# Patient Record
Sex: Female | Born: 1945
Health system: Southern US, Community
[De-identification: ages and names within clinical notes are randomized; demographics above are authoritative.]

## PROBLEM LIST (undated history)

## (undated) DIAGNOSIS — I209 Angina pectoris, unspecified: Secondary | ICD-10-CM

## (undated) DIAGNOSIS — R0602 Shortness of breath: Secondary | ICD-10-CM

## (undated) DIAGNOSIS — F419 Anxiety disorder, unspecified: Secondary | ICD-10-CM

## (undated) DIAGNOSIS — R12 Heartburn: Secondary | ICD-10-CM

## (undated) DIAGNOSIS — R51 Headache: Secondary | ICD-10-CM

## (undated) DIAGNOSIS — I499 Cardiac arrhythmia, unspecified: Secondary | ICD-10-CM

## (undated) DIAGNOSIS — R519 Headache, unspecified: Secondary | ICD-10-CM

## (undated) DIAGNOSIS — C801 Malignant (primary) neoplasm, unspecified: Secondary | ICD-10-CM

## (undated) DIAGNOSIS — R112 Nausea with vomiting, unspecified: Secondary | ICD-10-CM

## (undated) DIAGNOSIS — C50919 Malignant neoplasm of unspecified site of unspecified female breast: Secondary | ICD-10-CM

## (undated) DIAGNOSIS — D649 Anemia, unspecified: Secondary | ICD-10-CM

## (undated) DIAGNOSIS — K317 Polyp of stomach and duodenum: Secondary | ICD-10-CM

## (undated) DIAGNOSIS — T4145XA Adverse effect of unspecified anesthetic, initial encounter: Secondary | ICD-10-CM

## (undated) DIAGNOSIS — Z9889 Other specified postprocedural states: Secondary | ICD-10-CM

## (undated) DIAGNOSIS — I10 Essential (primary) hypertension: Secondary | ICD-10-CM

## (undated) DIAGNOSIS — R251 Tremor, unspecified: Secondary | ICD-10-CM

## (undated) DIAGNOSIS — I2510 Atherosclerotic heart disease of native coronary artery without angina pectoris: Secondary | ICD-10-CM

## (undated) DIAGNOSIS — K449 Diaphragmatic hernia without obstruction or gangrene: Secondary | ICD-10-CM

## (undated) DIAGNOSIS — Z923 Personal history of irradiation: Secondary | ICD-10-CM

## (undated) DIAGNOSIS — G4730 Sleep apnea, unspecified: Secondary | ICD-10-CM

## (undated) DIAGNOSIS — Z1379 Encounter for other screening for genetic and chromosomal anomalies: Secondary | ICD-10-CM

## (undated) DIAGNOSIS — E119 Type 2 diabetes mellitus without complications: Secondary | ICD-10-CM

## (undated) DIAGNOSIS — T8859XA Other complications of anesthesia, initial encounter: Secondary | ICD-10-CM

## (undated) DIAGNOSIS — E039 Hypothyroidism, unspecified: Secondary | ICD-10-CM

## (undated) DIAGNOSIS — M8580 Other specified disorders of bone density and structure, unspecified site: Secondary | ICD-10-CM

## (undated) DIAGNOSIS — K219 Gastro-esophageal reflux disease without esophagitis: Secondary | ICD-10-CM

## (undated) DIAGNOSIS — J189 Pneumonia, unspecified organism: Secondary | ICD-10-CM

## (undated) DIAGNOSIS — M1990 Unspecified osteoarthritis, unspecified site: Secondary | ICD-10-CM

## (undated) HISTORY — DX: Gastro-esophageal reflux disease without esophagitis: K21.9

## (undated) HISTORY — PX: ELBOW FRACTURE SURGERY: SHX616

## (undated) HISTORY — DX: Atherosclerotic heart disease of native coronary artery without angina pectoris: I25.10

## (undated) HISTORY — PX: INCONTINENCE SURGERY: SHX676

## (undated) HISTORY — PX: BREAST LUMPECTOMY: SHX2

## (undated) HISTORY — PX: DILATION AND CURETTAGE OF UTERUS: SHX78

## (undated) HISTORY — PX: OTHER SURGICAL HISTORY: SHX169

## (undated) HISTORY — DX: Heartburn: R12

## (undated) HISTORY — DX: Hypothyroidism, unspecified: E03.9

## (undated) HISTORY — DX: Polyp of stomach and duodenum: K31.7

## (undated) HISTORY — DX: Malignant (primary) neoplasm, unspecified: C80.1

## (undated) HISTORY — DX: Diaphragmatic hernia without obstruction or gangrene: K44.9

## (undated) HISTORY — DX: Encounter for other screening for genetic and chromosomal anomalies: Z13.79

## (undated) HISTORY — DX: Type 2 diabetes mellitus without complications: E11.9

## (undated) HISTORY — DX: Essential (primary) hypertension: I10

## (undated) HISTORY — DX: Other specified disorders of bone density and structure, unspecified site: M85.80

---
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
## 1985-08-13 HISTORY — PX: BREAST BIOPSY: SHX20

## 1999-01-04 ENCOUNTER — Other Ambulatory Visit: Admission: RE | Admit: 1999-01-04 | Discharge: 1999-01-04 | Payer: Self-pay | Admitting: Emergency Medicine

## 1999-02-02 ENCOUNTER — Ambulatory Visit (HOSPITAL_COMMUNITY): Admission: RE | Admit: 1999-02-02 | Discharge: 1999-02-02 | Payer: Self-pay | Admitting: Emergency Medicine

## 1999-06-15 ENCOUNTER — Encounter: Admission: RE | Admit: 1999-06-15 | Discharge: 1999-06-15 | Payer: Self-pay | Admitting: Family Medicine

## 1999-06-15 ENCOUNTER — Encounter: Payer: Self-pay | Admitting: Family Medicine

## 2000-02-05 ENCOUNTER — Encounter: Admission: RE | Admit: 2000-02-05 | Discharge: 2000-05-05 | Payer: Self-pay | Admitting: Emergency Medicine

## 2000-03-08 ENCOUNTER — Other Ambulatory Visit: Admission: RE | Admit: 2000-03-08 | Discharge: 2000-03-08 | Payer: Self-pay | Admitting: Emergency Medicine

## 2000-04-08 ENCOUNTER — Encounter: Payer: Self-pay | Admitting: Emergency Medicine

## 2000-04-08 ENCOUNTER — Ambulatory Visit (HOSPITAL_COMMUNITY): Admission: RE | Admit: 2000-04-08 | Discharge: 2000-04-08 | Payer: Self-pay | Admitting: Emergency Medicine

## 2001-03-18 ENCOUNTER — Encounter: Payer: Self-pay | Admitting: Emergency Medicine

## 2001-03-18 ENCOUNTER — Inpatient Hospital Stay (HOSPITAL_COMMUNITY): Admission: EM | Admit: 2001-03-18 | Discharge: 2001-03-20 | Payer: Self-pay | Admitting: Emergency Medicine

## 2001-05-08 ENCOUNTER — Other Ambulatory Visit: Admission: RE | Admit: 2001-05-08 | Discharge: 2001-05-08 | Payer: Self-pay | Admitting: Obstetrics and Gynecology

## 2001-09-05 ENCOUNTER — Encounter: Payer: Self-pay | Admitting: *Deleted

## 2001-09-05 ENCOUNTER — Encounter: Admission: RE | Admit: 2001-09-05 | Discharge: 2001-09-05 | Payer: Self-pay | Admitting: *Deleted

## 2001-09-16 ENCOUNTER — Ambulatory Visit (HOSPITAL_COMMUNITY): Admission: RE | Admit: 2001-09-16 | Discharge: 2001-09-17 | Payer: Self-pay | Admitting: Interventional Cardiology

## 2001-11-21 ENCOUNTER — Encounter: Payer: Self-pay | Admitting: Obstetrics and Gynecology

## 2001-11-21 ENCOUNTER — Ambulatory Visit (HOSPITAL_COMMUNITY): Admission: RE | Admit: 2001-11-21 | Discharge: 2001-11-21 | Payer: Self-pay | Admitting: Obstetrics and Gynecology

## 2002-09-01 ENCOUNTER — Other Ambulatory Visit: Admission: RE | Admit: 2002-09-01 | Discharge: 2002-09-01 | Payer: Self-pay | Admitting: Emergency Medicine

## 2002-09-21 ENCOUNTER — Ambulatory Visit (HOSPITAL_COMMUNITY): Admission: RE | Admit: 2002-09-21 | Discharge: 2002-09-21 | Payer: Self-pay | Admitting: Emergency Medicine

## 2002-09-21 ENCOUNTER — Encounter: Payer: Self-pay | Admitting: Emergency Medicine

## 2003-10-20 ENCOUNTER — Other Ambulatory Visit: Admission: RE | Admit: 2003-10-20 | Discharge: 2003-10-20 | Payer: Self-pay | Admitting: Emergency Medicine

## 2004-01-12 ENCOUNTER — Ambulatory Visit (HOSPITAL_COMMUNITY): Admission: RE | Admit: 2004-01-12 | Discharge: 2004-01-13 | Payer: Self-pay | Admitting: *Deleted

## 2004-03-23 ENCOUNTER — Other Ambulatory Visit: Admission: RE | Admit: 2004-03-23 | Discharge: 2004-03-23 | Payer: Self-pay | Admitting: Obstetrics and Gynecology

## 2004-06-26 ENCOUNTER — Ambulatory Visit (HOSPITAL_COMMUNITY): Admission: RE | Admit: 2004-06-26 | Discharge: 2004-06-26 | Payer: Self-pay | Admitting: Emergency Medicine

## 2004-12-05 ENCOUNTER — Ambulatory Visit (HOSPITAL_COMMUNITY): Admission: RE | Admit: 2004-12-05 | Discharge: 2004-12-06 | Payer: Self-pay | Admitting: *Deleted

## 2005-01-01 ENCOUNTER — Encounter (HOSPITAL_COMMUNITY): Admission: RE | Admit: 2005-01-01 | Discharge: 2005-01-14 | Payer: Self-pay | Admitting: *Deleted

## 2005-02-15 ENCOUNTER — Encounter (HOSPITAL_COMMUNITY): Admission: RE | Admit: 2005-02-15 | Discharge: 2005-05-16 | Payer: Self-pay | Admitting: *Deleted

## 2005-02-26 ENCOUNTER — Encounter (HOSPITAL_COMMUNITY): Admission: RE | Admit: 2005-02-26 | Discharge: 2005-02-27 | Payer: Self-pay | Admitting: *Deleted

## 2005-05-17 ENCOUNTER — Encounter (HOSPITAL_COMMUNITY): Admission: RE | Admit: 2005-05-17 | Discharge: 2005-08-12 | Payer: Self-pay | Admitting: *Deleted

## 2005-08-14 ENCOUNTER — Encounter (HOSPITAL_COMMUNITY): Admission: RE | Admit: 2005-08-14 | Discharge: 2005-11-12 | Payer: Self-pay | Admitting: *Deleted

## 2005-11-13 ENCOUNTER — Encounter (HOSPITAL_COMMUNITY): Admission: RE | Admit: 2005-11-13 | Discharge: 2006-02-11 | Payer: Self-pay | Admitting: *Deleted

## 2006-02-08 ENCOUNTER — Ambulatory Visit (HOSPITAL_COMMUNITY): Admission: RE | Admit: 2006-02-08 | Discharge: 2006-02-08 | Payer: Self-pay | Admitting: Emergency Medicine

## 2006-02-12 ENCOUNTER — Encounter (HOSPITAL_COMMUNITY): Admission: RE | Admit: 2006-02-12 | Discharge: 2006-05-13 | Payer: Self-pay | Admitting: *Deleted

## 2006-05-14 ENCOUNTER — Encounter (HOSPITAL_COMMUNITY): Admission: RE | Admit: 2006-05-14 | Discharge: 2006-08-12 | Payer: Self-pay | Admitting: *Deleted

## 2006-06-21 ENCOUNTER — Other Ambulatory Visit: Admission: RE | Admit: 2006-06-21 | Discharge: 2006-06-21 | Payer: Self-pay | Admitting: Emergency Medicine

## 2006-08-13 ENCOUNTER — Encounter (HOSPITAL_COMMUNITY): Admission: RE | Admit: 2006-08-13 | Discharge: 2006-11-11 | Payer: Self-pay | Admitting: *Deleted

## 2006-11-12 ENCOUNTER — Encounter (HOSPITAL_COMMUNITY): Admission: RE | Admit: 2006-11-12 | Discharge: 2007-02-10 | Payer: Self-pay | Admitting: *Deleted

## 2007-02-11 ENCOUNTER — Encounter (HOSPITAL_COMMUNITY): Admission: RE | Admit: 2007-02-11 | Discharge: 2007-05-12 | Payer: Self-pay | Admitting: *Deleted

## 2007-04-01 ENCOUNTER — Ambulatory Visit: Payer: Self-pay | Admitting: Internal Medicine

## 2007-05-14 ENCOUNTER — Ambulatory Visit: Payer: Self-pay | Admitting: Internal Medicine

## 2007-05-14 ENCOUNTER — Encounter: Payer: Self-pay | Admitting: Internal Medicine

## 2007-05-14 ENCOUNTER — Encounter (HOSPITAL_COMMUNITY): Admission: RE | Admit: 2007-05-14 | Discharge: 2007-08-12 | Payer: Self-pay | Admitting: *Deleted

## 2007-08-12 ENCOUNTER — Observation Stay (HOSPITAL_COMMUNITY): Admission: AD | Admit: 2007-08-12 | Discharge: 2007-08-13 | Payer: Self-pay | Admitting: Interventional Cardiology

## 2007-09-18 ENCOUNTER — Encounter (HOSPITAL_COMMUNITY): Admission: RE | Admit: 2007-09-18 | Discharge: 2007-11-03 | Payer: Self-pay | Admitting: Interventional Cardiology

## 2007-10-28 ENCOUNTER — Ambulatory Visit (HOSPITAL_COMMUNITY): Admission: RE | Admit: 2007-10-28 | Discharge: 2007-10-28 | Payer: Self-pay | Admitting: Emergency Medicine

## 2007-11-04 ENCOUNTER — Encounter (HOSPITAL_COMMUNITY): Admission: RE | Admit: 2007-11-04 | Discharge: 2008-02-02 | Payer: Self-pay | Admitting: Interventional Cardiology

## 2007-11-19 DIAGNOSIS — E119 Type 2 diabetes mellitus without complications: Secondary | ICD-10-CM | POA: Insufficient documentation

## 2007-11-19 DIAGNOSIS — K449 Diaphragmatic hernia without obstruction or gangrene: Secondary | ICD-10-CM | POA: Insufficient documentation

## 2007-11-19 DIAGNOSIS — R12 Heartburn: Secondary | ICD-10-CM | POA: Insufficient documentation

## 2007-11-19 DIAGNOSIS — I2510 Atherosclerotic heart disease of native coronary artery without angina pectoris: Secondary | ICD-10-CM | POA: Insufficient documentation

## 2007-11-19 DIAGNOSIS — K219 Gastro-esophageal reflux disease without esophagitis: Secondary | ICD-10-CM | POA: Insufficient documentation

## 2007-11-19 DIAGNOSIS — E118 Type 2 diabetes mellitus with unspecified complications: Secondary | ICD-10-CM | POA: Insufficient documentation

## 2007-11-19 DIAGNOSIS — E1165 Type 2 diabetes mellitus with hyperglycemia: Secondary | ICD-10-CM | POA: Insufficient documentation

## 2007-11-19 DIAGNOSIS — K625 Hemorrhage of anus and rectum: Secondary | ICD-10-CM | POA: Insufficient documentation

## 2007-11-19 DIAGNOSIS — I10 Essential (primary) hypertension: Secondary | ICD-10-CM | POA: Insufficient documentation

## 2007-11-19 DIAGNOSIS — E039 Hypothyroidism, unspecified: Secondary | ICD-10-CM | POA: Insufficient documentation

## 2007-11-19 DIAGNOSIS — D131 Benign neoplasm of stomach: Secondary | ICD-10-CM | POA: Insufficient documentation

## 2007-11-19 DIAGNOSIS — Z794 Long term (current) use of insulin: Secondary | ICD-10-CM | POA: Insufficient documentation

## 2008-02-11 ENCOUNTER — Encounter (HOSPITAL_COMMUNITY): Admission: RE | Admit: 2008-02-11 | Discharge: 2008-05-11 | Payer: Self-pay | Admitting: Interventional Cardiology

## 2008-03-07 ENCOUNTER — Emergency Department (HOSPITAL_COMMUNITY): Admission: EM | Admit: 2008-03-07 | Discharge: 2008-03-07 | Payer: Self-pay | Admitting: Emergency Medicine

## 2008-03-08 ENCOUNTER — Ambulatory Visit: Payer: Self-pay | Admitting: Vascular Surgery

## 2008-03-08 ENCOUNTER — Ambulatory Visit (HOSPITAL_COMMUNITY): Admission: RE | Admit: 2008-03-08 | Discharge: 2008-03-08 | Payer: Self-pay | Admitting: Emergency Medicine

## 2008-03-08 ENCOUNTER — Encounter (INDEPENDENT_AMBULATORY_CARE_PROVIDER_SITE_OTHER): Payer: Self-pay | Admitting: Emergency Medicine

## 2008-05-13 ENCOUNTER — Encounter (HOSPITAL_COMMUNITY): Admission: RE | Admit: 2008-05-13 | Discharge: 2008-08-10 | Payer: Self-pay | Admitting: Interventional Cardiology

## 2008-08-13 ENCOUNTER — Encounter (HOSPITAL_COMMUNITY): Admission: RE | Admit: 2008-08-13 | Discharge: 2008-11-11 | Payer: Self-pay | Admitting: Interventional Cardiology

## 2008-09-22 ENCOUNTER — Ambulatory Visit (HOSPITAL_BASED_OUTPATIENT_CLINIC_OR_DEPARTMENT_OTHER): Admission: RE | Admit: 2008-09-22 | Discharge: 2008-09-22 | Payer: Self-pay | Admitting: Urology

## 2008-11-12 ENCOUNTER — Encounter (HOSPITAL_COMMUNITY): Admission: RE | Admit: 2008-11-12 | Discharge: 2009-02-10 | Payer: Self-pay | Admitting: Interventional Cardiology

## 2008-11-18 ENCOUNTER — Ambulatory Visit (HOSPITAL_COMMUNITY): Admission: RE | Admit: 2008-11-18 | Discharge: 2008-11-18 | Payer: Self-pay | Admitting: Emergency Medicine

## 2008-11-25 ENCOUNTER — Encounter: Admission: RE | Admit: 2008-11-25 | Discharge: 2008-11-25 | Payer: Self-pay | Admitting: Emergency Medicine

## 2009-02-11 ENCOUNTER — Encounter (HOSPITAL_COMMUNITY): Admission: RE | Admit: 2009-02-11 | Discharge: 2009-05-12 | Payer: Self-pay | Admitting: Interventional Cardiology

## 2009-05-13 ENCOUNTER — Encounter (HOSPITAL_COMMUNITY): Admission: RE | Admit: 2009-05-13 | Discharge: 2009-08-11 | Payer: Self-pay | Admitting: Interventional Cardiology

## 2009-08-13 ENCOUNTER — Encounter (HOSPITAL_COMMUNITY): Admission: RE | Admit: 2009-08-13 | Discharge: 2009-11-11 | Payer: Self-pay | Admitting: Interventional Cardiology

## 2009-11-12 ENCOUNTER — Encounter (HOSPITAL_COMMUNITY): Admission: RE | Admit: 2009-11-12 | Discharge: 2010-02-10 | Payer: Self-pay | Admitting: Interventional Cardiology

## 2009-12-13 ENCOUNTER — Encounter: Admission: RE | Admit: 2009-12-13 | Discharge: 2009-12-13 | Payer: Self-pay | Admitting: Family Medicine

## 2010-02-11 ENCOUNTER — Encounter (HOSPITAL_COMMUNITY): Admission: RE | Admit: 2010-02-11 | Discharge: 2010-05-12 | Payer: Self-pay | Admitting: Interventional Cardiology

## 2010-05-13 ENCOUNTER — Encounter (HOSPITAL_COMMUNITY)
Admission: RE | Admit: 2010-05-13 | Discharge: 2010-08-11 | Payer: Self-pay | Source: Home / Self Care | Attending: Interventional Cardiology | Admitting: Interventional Cardiology

## 2010-08-15 ENCOUNTER — Encounter (HOSPITAL_COMMUNITY)
Admission: RE | Admit: 2010-08-15 | Discharge: 2010-09-12 | Payer: Self-pay | Source: Home / Self Care | Attending: Interventional Cardiology | Admitting: Interventional Cardiology

## 2010-09-03 ENCOUNTER — Encounter: Payer: Self-pay | Admitting: Emergency Medicine

## 2010-09-14 ENCOUNTER — Encounter (HOSPITAL_COMMUNITY): Payer: Self-pay

## 2010-09-15 ENCOUNTER — Encounter (HOSPITAL_COMMUNITY): Payer: Self-pay

## 2010-09-19 ENCOUNTER — Encounter (HOSPITAL_COMMUNITY): Payer: Self-pay

## 2010-09-21 ENCOUNTER — Encounter (HOSPITAL_COMMUNITY): Payer: Self-pay

## 2010-09-22 ENCOUNTER — Encounter (HOSPITAL_COMMUNITY): Payer: Self-pay

## 2010-09-26 ENCOUNTER — Encounter (HOSPITAL_COMMUNITY): Payer: Self-pay

## 2010-09-28 ENCOUNTER — Encounter (HOSPITAL_COMMUNITY): Payer: Self-pay | Attending: Interventional Cardiology

## 2010-09-28 DIAGNOSIS — R079 Chest pain, unspecified: Secondary | ICD-10-CM | POA: Insufficient documentation

## 2010-09-28 DIAGNOSIS — E7800 Pure hypercholesterolemia, unspecified: Secondary | ICD-10-CM | POA: Insufficient documentation

## 2010-09-28 DIAGNOSIS — R0602 Shortness of breath: Secondary | ICD-10-CM | POA: Insufficient documentation

## 2010-09-28 DIAGNOSIS — Z5189 Encounter for other specified aftercare: Secondary | ICD-10-CM | POA: Insufficient documentation

## 2010-09-28 DIAGNOSIS — I2510 Atherosclerotic heart disease of native coronary artery without angina pectoris: Secondary | ICD-10-CM | POA: Insufficient documentation

## 2010-09-28 DIAGNOSIS — E119 Type 2 diabetes mellitus without complications: Secondary | ICD-10-CM | POA: Insufficient documentation

## 2010-09-28 DIAGNOSIS — Z9861 Coronary angioplasty status: Secondary | ICD-10-CM | POA: Insufficient documentation

## 2010-09-28 DIAGNOSIS — Z8249 Family history of ischemic heart disease and other diseases of the circulatory system: Secondary | ICD-10-CM | POA: Insufficient documentation

## 2010-09-28 DIAGNOSIS — I10 Essential (primary) hypertension: Secondary | ICD-10-CM | POA: Insufficient documentation

## 2010-09-29 ENCOUNTER — Encounter (HOSPITAL_COMMUNITY): Payer: Self-pay

## 2010-10-03 ENCOUNTER — Encounter (HOSPITAL_COMMUNITY): Payer: Self-pay

## 2010-10-05 ENCOUNTER — Encounter (HOSPITAL_COMMUNITY): Payer: Self-pay

## 2010-10-06 ENCOUNTER — Encounter (HOSPITAL_COMMUNITY): Payer: Self-pay

## 2010-10-10 ENCOUNTER — Encounter (HOSPITAL_COMMUNITY): Payer: Self-pay

## 2010-10-12 ENCOUNTER — Encounter (HOSPITAL_COMMUNITY): Payer: Self-pay | Attending: Interventional Cardiology

## 2010-10-12 DIAGNOSIS — Z5189 Encounter for other specified aftercare: Secondary | ICD-10-CM | POA: Insufficient documentation

## 2010-10-12 DIAGNOSIS — R079 Chest pain, unspecified: Secondary | ICD-10-CM | POA: Insufficient documentation

## 2010-10-12 DIAGNOSIS — E119 Type 2 diabetes mellitus without complications: Secondary | ICD-10-CM | POA: Insufficient documentation

## 2010-10-12 DIAGNOSIS — Z8249 Family history of ischemic heart disease and other diseases of the circulatory system: Secondary | ICD-10-CM | POA: Insufficient documentation

## 2010-10-12 DIAGNOSIS — I10 Essential (primary) hypertension: Secondary | ICD-10-CM | POA: Insufficient documentation

## 2010-10-12 DIAGNOSIS — I2510 Atherosclerotic heart disease of native coronary artery without angina pectoris: Secondary | ICD-10-CM | POA: Insufficient documentation

## 2010-10-12 DIAGNOSIS — R0602 Shortness of breath: Secondary | ICD-10-CM | POA: Insufficient documentation

## 2010-10-12 DIAGNOSIS — Z9861 Coronary angioplasty status: Secondary | ICD-10-CM | POA: Insufficient documentation

## 2010-10-12 DIAGNOSIS — E7800 Pure hypercholesterolemia, unspecified: Secondary | ICD-10-CM | POA: Insufficient documentation

## 2010-10-13 ENCOUNTER — Encounter (HOSPITAL_COMMUNITY): Payer: Self-pay

## 2010-10-17 ENCOUNTER — Encounter (HOSPITAL_COMMUNITY): Payer: Self-pay

## 2010-10-19 ENCOUNTER — Encounter (HOSPITAL_COMMUNITY): Payer: Self-pay

## 2010-10-20 ENCOUNTER — Encounter (HOSPITAL_COMMUNITY): Payer: Self-pay

## 2010-10-24 ENCOUNTER — Encounter (HOSPITAL_COMMUNITY): Payer: Self-pay

## 2010-10-26 ENCOUNTER — Encounter (HOSPITAL_COMMUNITY): Payer: Self-pay

## 2010-10-27 ENCOUNTER — Encounter (HOSPITAL_COMMUNITY): Payer: Self-pay

## 2010-10-29 LAB — GLUCOSE, CAPILLARY: Glucose-Capillary: 129 mg/dL — ABNORMAL HIGH (ref 70–99)

## 2010-10-31 ENCOUNTER — Encounter (HOSPITAL_COMMUNITY): Payer: Self-pay

## 2010-11-02 ENCOUNTER — Encounter (HOSPITAL_COMMUNITY): Payer: Self-pay

## 2010-11-03 ENCOUNTER — Encounter (HOSPITAL_COMMUNITY): Payer: Self-pay

## 2010-11-07 ENCOUNTER — Encounter (HOSPITAL_COMMUNITY): Payer: Self-pay

## 2010-11-09 ENCOUNTER — Encounter (HOSPITAL_COMMUNITY): Payer: Self-pay

## 2010-11-10 ENCOUNTER — Encounter (HOSPITAL_COMMUNITY): Payer: Self-pay

## 2010-11-14 ENCOUNTER — Encounter (HOSPITAL_COMMUNITY): Payer: Self-pay | Attending: Interventional Cardiology

## 2010-11-14 DIAGNOSIS — I2510 Atherosclerotic heart disease of native coronary artery without angina pectoris: Secondary | ICD-10-CM | POA: Insufficient documentation

## 2010-11-14 DIAGNOSIS — E7800 Pure hypercholesterolemia, unspecified: Secondary | ICD-10-CM | POA: Insufficient documentation

## 2010-11-14 DIAGNOSIS — I10 Essential (primary) hypertension: Secondary | ICD-10-CM | POA: Insufficient documentation

## 2010-11-14 DIAGNOSIS — Z8249 Family history of ischemic heart disease and other diseases of the circulatory system: Secondary | ICD-10-CM | POA: Insufficient documentation

## 2010-11-14 DIAGNOSIS — Z5189 Encounter for other specified aftercare: Secondary | ICD-10-CM | POA: Insufficient documentation

## 2010-11-14 DIAGNOSIS — R079 Chest pain, unspecified: Secondary | ICD-10-CM | POA: Insufficient documentation

## 2010-11-14 DIAGNOSIS — R0602 Shortness of breath: Secondary | ICD-10-CM | POA: Insufficient documentation

## 2010-11-14 DIAGNOSIS — E119 Type 2 diabetes mellitus without complications: Secondary | ICD-10-CM | POA: Insufficient documentation

## 2010-11-14 DIAGNOSIS — Z9861 Coronary angioplasty status: Secondary | ICD-10-CM | POA: Insufficient documentation

## 2010-11-16 ENCOUNTER — Encounter (HOSPITAL_COMMUNITY): Payer: Self-pay

## 2010-11-17 ENCOUNTER — Encounter (HOSPITAL_COMMUNITY): Payer: Self-pay

## 2010-11-21 ENCOUNTER — Encounter (HOSPITAL_COMMUNITY): Payer: Self-pay

## 2010-11-23 ENCOUNTER — Encounter (HOSPITAL_COMMUNITY): Payer: Self-pay

## 2010-11-24 ENCOUNTER — Encounter (HOSPITAL_COMMUNITY): Payer: Self-pay

## 2010-11-28 ENCOUNTER — Encounter (HOSPITAL_COMMUNITY): Payer: Self-pay

## 2010-11-28 LAB — CBC
HCT: 35.3 % — ABNORMAL LOW (ref 36.0–46.0)
Hemoglobin: 11.7 g/dL — ABNORMAL LOW (ref 12.0–15.0)
MCHC: 33.2 g/dL (ref 30.0–36.0)
MCV: 85.3 fL (ref 78.0–100.0)
Platelets: 317 10*3/uL (ref 150–400)
RBC: 4.13 MIL/uL (ref 3.87–5.11)
RDW: 14.7 % (ref 11.5–15.5)
WBC: 8.9 10*3/uL (ref 4.0–10.5)

## 2010-11-28 LAB — BASIC METABOLIC PANEL
BUN: 12 mg/dL (ref 6–23)
CO2: 28 mEq/L (ref 19–32)
Calcium: 9.6 mg/dL (ref 8.4–10.5)
Chloride: 100 mEq/L (ref 96–112)
Creatinine, Ser: 0.76 mg/dL (ref 0.4–1.2)
GFR calc Af Amer: >60 mL/min (ref >60)
GFR calc non Af Amer: >60 mL/min (ref >60)
Glucose, Bld: 129 mg/dL — ABNORMAL HIGH (ref 70–99)
Potassium: 4.5 mEq/L (ref 3.5–5.1)
Sodium: 136 mEq/L (ref 135–145)

## 2010-11-28 LAB — GLUCOSE, CAPILLARY
Glucose-Capillary: 116 mg/dL — ABNORMAL HIGH (ref 70–99)
Glucose-Capillary: 136 mg/dL — ABNORMAL HIGH (ref 70–99)

## 2010-11-28 LAB — APTT: aPTT: 32 seconds (ref 24–37)

## 2010-11-28 LAB — PROTIME-INR
INR: 1.1 (ref 0.00–1.49)
Prothrombin Time: 14.3 seconds (ref 11.6–15.2)

## 2010-11-30 ENCOUNTER — Encounter (HOSPITAL_COMMUNITY): Payer: Self-pay

## 2010-12-01 ENCOUNTER — Encounter (HOSPITAL_COMMUNITY): Payer: Self-pay

## 2010-12-05 ENCOUNTER — Encounter (HOSPITAL_COMMUNITY): Payer: Self-pay

## 2010-12-07 ENCOUNTER — Encounter (HOSPITAL_COMMUNITY): Payer: Self-pay

## 2010-12-08 ENCOUNTER — Encounter (HOSPITAL_COMMUNITY): Payer: Self-pay

## 2010-12-12 ENCOUNTER — Encounter (HOSPITAL_COMMUNITY): Payer: Self-pay | Attending: Interventional Cardiology

## 2010-12-12 DIAGNOSIS — E7800 Pure hypercholesterolemia, unspecified: Secondary | ICD-10-CM | POA: Insufficient documentation

## 2010-12-12 DIAGNOSIS — R0602 Shortness of breath: Secondary | ICD-10-CM | POA: Insufficient documentation

## 2010-12-12 DIAGNOSIS — E119 Type 2 diabetes mellitus without complications: Secondary | ICD-10-CM | POA: Insufficient documentation

## 2010-12-12 DIAGNOSIS — I2510 Atherosclerotic heart disease of native coronary artery without angina pectoris: Secondary | ICD-10-CM | POA: Insufficient documentation

## 2010-12-12 DIAGNOSIS — Z9861 Coronary angioplasty status: Secondary | ICD-10-CM | POA: Insufficient documentation

## 2010-12-12 DIAGNOSIS — Z8249 Family history of ischemic heart disease and other diseases of the circulatory system: Secondary | ICD-10-CM | POA: Insufficient documentation

## 2010-12-12 DIAGNOSIS — I10 Essential (primary) hypertension: Secondary | ICD-10-CM | POA: Insufficient documentation

## 2010-12-12 DIAGNOSIS — Z5189 Encounter for other specified aftercare: Secondary | ICD-10-CM | POA: Insufficient documentation

## 2010-12-12 DIAGNOSIS — R079 Chest pain, unspecified: Secondary | ICD-10-CM | POA: Insufficient documentation

## 2010-12-14 ENCOUNTER — Encounter (HOSPITAL_COMMUNITY): Payer: Self-pay

## 2010-12-15 ENCOUNTER — Encounter (HOSPITAL_COMMUNITY): Payer: Self-pay

## 2010-12-19 ENCOUNTER — Encounter (HOSPITAL_COMMUNITY): Payer: Self-pay

## 2010-12-21 ENCOUNTER — Encounter (HOSPITAL_COMMUNITY): Payer: Self-pay

## 2010-12-22 ENCOUNTER — Encounter (HOSPITAL_COMMUNITY): Payer: Self-pay

## 2010-12-26 ENCOUNTER — Encounter (HOSPITAL_COMMUNITY): Payer: Self-pay

## 2010-12-26 NOTE — Assessment & Plan Note (Signed)
[facility]                         GASTROENTEROLOGY OFFICE NOTE   Susan Barnett, Susan Barnett                      MRN:          [id number]  DATE:04/01/2007                            DOB:          02-25-1946    The patient is self-referred.   REASON FOR EVALUATION:  Heartburn, rectal bleeding, and screening  colonoscopy.   HISTORY:  This is a pleasant 65 year old white female with a history of  coronary artery disease status post coronary artery stent placement,  hypertension, diabetes mellitus, and hypothyroidism.  She was evaluated  in this office Dec 14, 2003, regarding left upper quadrant pain, rectal  bleeding and the need for colonoscopy.  The impression at that time was  vague left-sided chest discomfort not likely gastrointestinal in  etiology, minor rectal bleeding, chronic reflux disease.  She was to  continue on Nexium and set up with a colonoscopy and upper endoscopy.  Unfortunately, she had interval cardiac problems that required cardiac  intervention.  She has been on Plavix chronically.  From a cardiac  standpoint, she has been stable.  The patient reports the need for daily  Nexium.  Without Nexium, significant heartburn and indigestion.  No  dysphagia.  She also reports occasional problems with bloating and  diarrhea.  Prior problems with rectal bleeding have resolved.  She  attributes these to hemorrhoids.  She has had an increase in weight.  Prior problems with left upper quadrant pain have resolved.   PAST MEDICAL HISTORY:  As above.   ALLERGIES:  ASPIRIN, IBUPROFEN, SULFA, ZANTAC, AXID, COMPAZINE and  SHELLFISH.   CURRENT MEDICATIONS:  1. Plavix 75 mg daily.  2. Lipitor 10 mg daily.  3. Synthroid 75 mcg daily.  4. Potassium chloride 20 mEq daily.  5. Metformin 500 mg b.i.d.  6. Fosamax 35 mg weekly.  7. Nexium 40 mg daily.  8. Multivitamin.  9. B complex.  10.Calcium.  11.Coenzyme Q-10.  12.Actos 15 mg daily.  13.Metoprolol 100 mg b.i.d.  14.Micardis 80/25 mg daily.   FAMILY HISTORY:  Daughter with ulcerative colitis.  Mother with diabetes  and heart disease.   SOCIAL HISTORY:  The patient is married with two children.  She lives  with her husband.  She had worked previously as a Therapist, occupational, though is currently busy taking care of her young  grandchildren.  She does not smoke or use alcohol.   REVIEW OF SYSTEMS:  Per diagnostic evaluation form.   PHYSICAL EXAM:  A well-appearing female in no acute distress.  Blood pressure is 112/72, heart rate 72, weight 177 pounds.  She is 5  feet 3 inches in height.  HEENT:  Sclerae are anicteric.  Conjunctivae are pink.  Oral mucosa is  intact.  There is no adenopathy.  LUNGS:  Clear.  HEART:  Regular.  ABDOMEN:  Soft without tenderness, mass or hernia.  Good bowel sounds  heard.  EXTREMITIES:  Without edema.   IMPRESSION:  1. Prior history of minor rectal bleeding.  No recurrence.  2. Screening colonoscopy.  She is an appropriate candidate without  contraindications.  3. Chronic Plavix therapy for coronary artery disease.  4. Diabetes mellitus.  5. Chronic reflux disease requiring daily proton pump inhibitor      therapy for control.   RECOMMENDATIONS:  1. Colonoscopy.  The nature of the procedure as well as the risks,      benefits, and alternatives have been reviewed.  She understood and      agreed to proceed.  Regarding her diabetes, we will hold her oral      agents the morning of her exam.  Regarding her Plavix therapy, we      discussed the pros and cons of continuing or discontinuing Plavix.      It is her desire to have colonoscopy performed on Plavix,      understanding that minor pathology might be dealt with, more      significant pathology may require a second procedure off the      antiplatelet drug.  2. Upper endoscopy to rule out Barrett's esophagus.  The nature of      this procedure as well as its  risks, benefits, and alternatives      were also discussed.  She understood and agreed to proceed.  3. Ongoing general medical care with Dr. Georgina Pillion.     Wilhemina Bonito. Marina Goodell, MD  Electronically Signed    JNP/MedQ  DD: 04/01/2007  DT: 04/02/2007  Job #: [id number]   cc:   Oley Balm. Georgina Pillion, M.D.  Lyn Records, M.D.

## 2010-12-26 NOTE — H&P (Signed)
Susan Barnett, Susan Barnett NO.:  [id number]   MEDICAL RECORD NO.:  [id number]          PATIENT TYPE:  INP   LOCATION:  6529                         FACILITY:  [facility]   PHYSICIAN:  Lyn Records, M.D.   DATE OF BIRTH:  06-28-1946   DATE OF ADMISSION:  08/12/2007  DATE OF DISCHARGE:                              HISTORY & PHYSICAL   CHIEF COMPLAINT:  Unstable angina.   Susan Barnett is a 65 year old female with a known history of coronary  artery disease who had undergone multiple right coronary interventions  including drug-eluting stents and brachytherapy.  Now she is  experiencing angina with minimal exertion.  She was having pain today  while in cardiac rehabilitation, and she became pain free after 2  sublingual nitroglycerin.  Because of the quality of her pain and the  fact that she has pain with minimal exertion, she will be admitted today  and taken to the cardiac catheterization lab.   PAST MEDICAL HISTORY:  1. Hypertension  2. Diabetes mellitus.  3. Hypercholesterolemia.  4. Hypothyroidism.  5. Long-term medication use.   FAMILY HISTORY:  Coronary artery disease, GERD.  Mom had coronary artery  disease, died at age 48.  Her father died when she was 71 years old from  an airplane accident.   ALLERGIES:  SHRIMP, ASPIRIN, IBUPROFEN, SULFA, COMPAZINE.   CURRENT MEDICATIONS:  1. Plavix 75 mg once a day.  2. Lipitor 10 mg daily.  3. Synthroid 75 mg once a day.  4. Potassium 20 mEq daily.  5. Metformin 500 mg twice a day.  6. Fosamax 35 mg every Sunday.  7. Nexium 20 mg weekly.  8. Micardis/hydrochlorothiazide 80/25 mg once a day.  9. Toprol XL 100 mg twice a day.   She is not on aspirin.  She is ALLERGIC TO ASPIRIN.   SOCIAL HISTORY:  She does not smoke.   PHYSICAL EXAMINATION:  VITAL SIGNS:  Blood pressure 130/76, pulse 70,  respirations 20.  HEENT:  Grossly normal.  Normocephalic.  No bruits.  No JVD or  thyromegaly.  Sclerae are clear.   Conjunctivae normal.  Nares without  drainage.  CHEST:  Clear to auscultation bilaterally.  No wheezing or rhonchi.  HEART:  Regular rate and rhythm.  No murmur.  ABDOMEN:  Benign.  Good bowel sounds.  Nontender.  Nondistended.  No  masses.  No bruits.  EXTREMITIES:  No femoral bruits.  No lower extremity edema.  SKIN:  Warm and dry.  NEURO:  Normal mood and affect.   ASSESSMENT AND PLAN:  1. Unstable angina.  2. Known coronary artery disease.  3. Diabetes mellitus.  4. Hyperlipidemia.  5. Hypothyroidism.   The patient has been seen and examined by Dr. Katrinka Blazing who plans to admit  the patient and perform cardiac catheterization later this morning.  The  procedure, risks and benefits have been discussed with the patient  including but not limited to heart attack, stroke or even death.      Guy Franco, P.A.      Lyn Records, M.D.  Electronically Signed  LB/MEDQ  D:  08/12/2007  T:  08/12/2007  Job:  [id number]

## 2010-12-26 NOTE — Cardiovascular Report (Signed)
NAMEAZIYAH, PROVENCAL NO.:  [id number]   MEDICAL RECORD NO.:  [id number]          PATIENT TYPE:  INP   LOCATION:  6529                         FACILITY:  [facility]   PHYSICIAN:  Lyn Records, M.D.   DATE OF BIRTH:  [birth year]   DATE OF PROCEDURE:  08/12/2007  DATE OF DISCHARGE:                            CARDIAC CATHETERIZATION   INDICATIONS:  Class III angina in a patient with a prior history of  distal right coronary bare-metal stenting in 2002; developed restenosis  and underwent brachytherapy.  Subsequently developed brachytherapy edge  restenosis.  This was treated with a Cypher drug-eluting stent and then  developed restenosis between the distal and the proximal stents.  This  was treated with angioplasty and stenting with a long Cypher drug-  eluting stent in 2006.  She presents now with a 3- to 4-week history of  increasing angina and this morning developed angina just walking across  the parking lot, coming into cardiac rehab.   PROCEDURE PERFORMED:  1. Left heart catheterization.  2. Selective coronary angiography.  3. Left ventriculography.  4. Angioplasty using AngioScore balloon on 2 focal areas of in-stent      restenosis within a 33-mm long distal right coronary stented area      that contains multiple overlapping stents.   DESCRIPTION:  After informed consent, the patient was brought to the  Catheterization Lab, where a 6-French sheath was placed in the right  femoral artery using the modified Seldinger technique.  A 6-French A2  multipurpose catheter was then used for hemodynamic recordings and left  ventriculography by hand injection and selective left and right coronary  angiography was attempted.  We then used a #4, 6-French left Judkins and  #4, 6-French right Judkins catheter for left and right coronary  angiography.  We identified the high-grade focal in-stent restenosis in  2 spots within the long Cypher stent placed distally.  There was  also 50-  60% stenosis outside the distal margin of the stent proximal to the PDA.   After discussion with the patient, rather than proceeding with coronary  bypass surgery following the right coronary disease, we decided to again  pursue PCI.  I decided to use a scoring balloon, angioscope 3.5-mm x 15-  mm long balloon.  This balloon would not pass primarily through the  second stenosis within the stented area of restenosis.  We then used a  3.0 x 15-mm long DuraStar balloon for pre-dilatation and then placed a 3-  5 AngioScore balloon and did overlapping balloon inflations to 12  atmospheres, which was an effective diameter with this balloon of 3.7 mm  in diameter.  Because I was still concerned about a relatively focal  region in the midportion of this stented area, I used a 3-5 DuraStar  balloon, 10 mm long, and dilated to 18 atmospheres with a good  angiographic result noted.  A TIMI grade 3 flow was noted.  The RCA  distal to the stents appeared slightly tighter than on the pre-PCI  images; I believe, this is due to spasm.  Angio-Seal was used for  closure.   The patient was preloaded with Plavix 300 mg.  She was already on Plavix  as an outpatient.  We used Angiomax bolus of 0.75 mg/kg over 2 minutes  and then an infusion of 1.75 mg/kg per hour.  The patient tolerated the  procedure.  Good hemostasis was achieved.  Intravenous nitroglycerin was  started during the case because of severe chest pain with each series of  balloon inflations.   RESULTS:  1. Hemodynamic data:      a.     Aortic pressure 139/70.      b.     Left ventricular pressure 139/8.  2. Left ventriculography:  LV cavity size and systolic function are      normal.  EF is 75%.  3. Coronary angiography.      a.     Left main coronary:  The left main coronary artery is       normal.      b.     Left anterior descending coronary:  The LAD is large, wraps       around the apex, contains moderate mid-vessel  irregularities       obstructing the vessel up to 30-40%.  No high-grade obstruction is       noted.  A large diagonal branch arises proximally.      c.     Circumflex artery:  The circumflex coronary artery is a       large vessel.  Given 3 obtuse marginal branches, the first obtuse       marginal contains a 70-80% ostial/proximal stenosis.  No high-       grade obstructions are noted in the circ systems.      d.     Right coronary:  The right coronary artery contains 2 areas       of focal restenosis within the proximal and then in the mid region       of overlapping stents in the distal right coronary.  These regions       of stenosis are greater than 95%.  The distal right coronary       beyond the stented region contains diffuse 50-60% narrowing before       the PDA.  4. PCI:  After angioplasty, the areas of focal restenosis in the      proximal and mid-distal RCA stent were reduced to 0% with a TIMI      grade 3 flow.   CONCLUSIONS:  1. Restenosis secondary to high-grade focal restenotic lesions within      the 33-mm long distal Cypher stent.  2. Successful AngioScore angioplasty of the regions of focal      restenosis.  3. There is intimal hyperplasia with moderate obstruction in the      distal right coronary before the PDA; this region was not touched.      The LAD contains moderate plaquing but no high-grade obstruction.  4. Normal LV function.   PLAN:  Discharge in a.m.  Continue Plavix.  THE PATIENT IS ALLERGIC TO  ASPIRIN.  We discussed surgery.  She would like to avoid surgery if  possible.  If she gets early restenosis, we should consider re-stenting  with a different antiproliferative, perhaps Taxus/paclitaxel, rather  than alignments stent.      Lyn Records, M.D.  Electronically Signed     HWS/MEDQ  D:  08/12/2007  T:  08/13/2007  Job:  [id number]   cc:   Onalee Hua  Gaston Islam, M.D.

## 2010-12-26 NOTE — Discharge Summary (Signed)
NAMESHANEYA, TAKETA NO.:  [id number]   MEDICAL RECORD NO.:  [id number]          PATIENT TYPE:  INP   LOCATION:  6529                         FACILITY:  [facility]   PHYSICIAN:  Lyn Records, M.D.   DATE OF BIRTH:  [birth year]   DATE OF ADMISSION:  08/12/2007  DATE OF DISCHARGE:  08/13/2007                               DISCHARGE SUMMARY   DISCHARGE DIAGNOSES:  1. Unstable angina, relieved.  2. Coronary artery disease status post drug-eluting stent to the right      coronary artery.  3. Hyperlipidemia.  4. Diabetes mellitus.  5. Hypothyroidism.  6. Long term medication use.   Ms. Helfand is a 65 year old female with a known history of coronary  artery disease who has undergone multiple right coronary artery  interventions including drug-eluting stents and brachytherapy.  On the  day of admission, she had been complaining of angina with minimal  exertion.  While in cardiac rehabilitation on the day of admission, she  became pain free after two sublingual nitroglycerin.  Because of this  concerning pain, she was admitted to the hospital.   She went to the cardiac catheterization lab on 08/12/2007 and was found  to have a 95% proximal and mid-stent stenosis.  A drug-eluting stent was  placed without difficulty.   The patient remained in the hospital overnight and was ready for  discharge the following day.  Labs today show a BUN 11, creatinine 0.69,  hemoglobin 11.8, hematocrit 34.7, cholesterol 115, triglycerides 69, LDL  61, HDL 41.   DISCHARGE MEDICATIONS:  1. Plavix 75 mg, one tablet daily, indefinitely.  2. Lipitor 2 mg a day.  3. Synthroid 75 mcg a day.  4. Potassium 20 mEq a day.  5. Fosamax every Sunday.  6. Nexium daily as needed.  7. Micardis/HCTZ 80/25 mg, one tablet daily.  8. Toprol XL 100 mg twice a day.  9. Coenzyme Q10 daily.  10.Sublingual nitroglycerin p.r.n. chest pain.   Resume metformin on 08/16/2007.   Clean cath site gently with  soap and water, no scrubbing.  May drive  after two days.  No lifting over ten pounds for one week.  Increase  activity slowly.  Return to Dr. Katrinka Blazing on 08/27/2007 at 1:15 p.m.      Guy Franco, P.A.      Lyn Records, M.D.  Electronically Signed    LB/MEDQ  D:  08/13/2007  T:  08/13/2007  Job:  [id number]   cc:   Lyn Records, M.D.

## 2010-12-26 NOTE — Op Note (Signed)
NAME:  Susan Barnett, Susan Barnett               ACCOUNT NO.:  [id number]   MEDICAL RECORD NO.:  [id number]          PATIENT TYPE:  AMB   LOCATION:  [facility]                         [facility]ACILITY:  [facility]   PHYSICIAN:  Courtney Paris, M.D.DATE OF BIRTH:  1946-01-08   DATE OF PROCEDURE:  09/22/2008  DATE OF DISCHARGE:                               OPERATIVE REPORT   PREOPERATIVE DIAGNOSIS:  Mixed urinary incontinence postop diagnosis  same.   OPERATIONS:  SPARC urethral sling and cystoscopy.   ANESTHESIA:  General.   SURGEON:  Courtney Paris, M.D.   BRIEF HISTORY:  This 65 year old patient with worsening mixed  incontinence enters for [facility] mid urethral sling.  She has improved  about 30% just with physical therapy of her mixed incontinence.  Urodynamics done last year showed a small capacity, hypersensitive,  unstable, bladder with stress incontinence.  She wears about six pads  per day.  Dr. Lucas Mallow has given her permission to go off the Plavix  because of an angioplasty she has had x6, the last time was 07/2007 for  the procedure.  She enters now for surgical correction understanding the  risks of possible urinary retention, pain, and not relieving her stress  incontinence.   DESCRIPTION OF PROCEDURE:  The patient was placed on the operating room  table in dorsal lithotomy position; after satisfactory induction of  general endotracheal anesthesia, was given IV Cipro.  Time-out was then  performed and the patient and procedure were then reconfirmed.  She was  prepped with Hibiclens rather than Betadine.  A #16 Foley catheter was  inserted and the bladder drained.  A weighted vaginal speculum was  inserted.  The 3-0 silk sutures were used on the labia to expose the  vaginal area and 10 mL of 1% Xylocaine with 1:1000 epinephrine was used  to infiltrate the mid urethral area on the lateral fornices of the  vagina.  A transverse incision was made with a 15 blade on the mid  urethral area.   Using Strully blunt tip scissors this plane was  developed and also laterally into the vaginal fornices to the endopelvic  fascia on either side.  This was dissected so it would accept my index  finger.  Bleeding was minimal.   Attention was then turned to the suprapubic area where 30 mL of  Xylocaine mixed with saline was injected through an incision that was  two fingerbreadths above and two fingerbreadths laterally to the midline  from the symphysis pubis. to hydrodissect the bladder off the symphysis  pubis.  The [facility] needles were then passed first on the patient's left,  then the right directing the needle back through the endopelvic fascia  hugging the pubic bone into the previously fashioned incision in the  vagina.  This was done on each side.  The Foley was then removed and a  cystoscope with a 70 degrees lens was inserted.  The bladder was filled.  The needles did not traverse bladder and the bladder was left partially  filled.  The [facility] sling was then attached to the ends of the needle and  pulled up into the suprapubic space.  This was adjusted carefully over  the midline with a Kelly hemostat between the urethra and the sling so  that it did not get too tight.   The outer sleeve was then removed and the sling was fixed in place.  The  edges of the redundant sling were then cut just below the skin line in  the suprapubic area and the wound irrigated with orthopedic bug juice.  The vaginal area was then closed with in two layers with 3-0 chromic  catgut suture to effect a nice cosmetic and hemostatic closure.  A Foley  catheter was then inserted and left to straight drainage and the wound  was packed with Iodoform gauze for compression.  The patient was then  taken to recovery room where the pack and the Foley will be removed in  an hour.  She will be expected to void before she leaves the outpatient  procedure.   Sponge, needle and instrument counts were correct.  Blood  loss was  minimal.  She went to the recovery room in good condition.      Courtney Paris, M.D.  Electronically Signed     HMK/MEDQ  D:  09/22/2008  T:  09/22/2008  Job:  [id number]

## 2010-12-28 ENCOUNTER — Encounter (HOSPITAL_COMMUNITY): Payer: Self-pay

## 2010-12-29 ENCOUNTER — Encounter (HOSPITAL_COMMUNITY): Payer: Self-pay

## 2011-01-02 ENCOUNTER — Encounter (HOSPITAL_COMMUNITY): Payer: Self-pay

## 2011-01-04 ENCOUNTER — Encounter (HOSPITAL_COMMUNITY): Payer: Self-pay

## 2011-01-05 ENCOUNTER — Encounter (HOSPITAL_COMMUNITY): Payer: Self-pay

## 2011-01-09 ENCOUNTER — Encounter (HOSPITAL_COMMUNITY): Payer: Self-pay

## 2011-01-11 ENCOUNTER — Encounter (HOSPITAL_COMMUNITY): Payer: Self-pay

## 2011-01-12 ENCOUNTER — Encounter (HOSPITAL_COMMUNITY): Payer: Self-pay | Attending: Interventional Cardiology

## 2011-01-12 DIAGNOSIS — Z5189 Encounter for other specified aftercare: Secondary | ICD-10-CM | POA: Insufficient documentation

## 2011-01-12 DIAGNOSIS — E7800 Pure hypercholesterolemia, unspecified: Secondary | ICD-10-CM | POA: Insufficient documentation

## 2011-01-12 DIAGNOSIS — R079 Chest pain, unspecified: Secondary | ICD-10-CM | POA: Insufficient documentation

## 2011-01-12 DIAGNOSIS — R0602 Shortness of breath: Secondary | ICD-10-CM | POA: Insufficient documentation

## 2011-01-12 DIAGNOSIS — Z8249 Family history of ischemic heart disease and other diseases of the circulatory system: Secondary | ICD-10-CM | POA: Insufficient documentation

## 2011-01-12 DIAGNOSIS — Z9861 Coronary angioplasty status: Secondary | ICD-10-CM | POA: Insufficient documentation

## 2011-01-12 DIAGNOSIS — I2510 Atherosclerotic heart disease of native coronary artery without angina pectoris: Secondary | ICD-10-CM | POA: Insufficient documentation

## 2011-01-12 DIAGNOSIS — I10 Essential (primary) hypertension: Secondary | ICD-10-CM | POA: Insufficient documentation

## 2011-01-12 DIAGNOSIS — E119 Type 2 diabetes mellitus without complications: Secondary | ICD-10-CM | POA: Insufficient documentation

## 2011-01-16 ENCOUNTER — Encounter (HOSPITAL_COMMUNITY): Payer: Self-pay

## 2011-01-18 ENCOUNTER — Encounter (HOSPITAL_COMMUNITY): Payer: Self-pay

## 2011-01-19 ENCOUNTER — Encounter (HOSPITAL_COMMUNITY): Payer: Self-pay

## 2011-01-23 ENCOUNTER — Encounter (HOSPITAL_COMMUNITY): Payer: Self-pay

## 2011-01-25 ENCOUNTER — Encounter (HOSPITAL_COMMUNITY): Payer: Self-pay

## 2011-01-26 ENCOUNTER — Encounter (HOSPITAL_COMMUNITY): Payer: Self-pay

## 2011-01-30 ENCOUNTER — Encounter (HOSPITAL_COMMUNITY): Payer: Self-pay

## 2011-02-01 ENCOUNTER — Encounter (HOSPITAL_COMMUNITY): Payer: Self-pay

## 2011-02-02 ENCOUNTER — Encounter (HOSPITAL_COMMUNITY): Payer: Self-pay

## 2011-02-06 ENCOUNTER — Encounter (HOSPITAL_COMMUNITY): Payer: Self-pay

## 2011-02-08 ENCOUNTER — Encounter (HOSPITAL_COMMUNITY): Payer: Self-pay

## 2011-02-09 ENCOUNTER — Encounter (HOSPITAL_COMMUNITY): Payer: Self-pay

## 2011-02-13 ENCOUNTER — Encounter (HOSPITAL_COMMUNITY): Payer: Self-pay | Attending: Interventional Cardiology

## 2011-02-13 DIAGNOSIS — I10 Essential (primary) hypertension: Secondary | ICD-10-CM | POA: Insufficient documentation

## 2011-02-13 DIAGNOSIS — E119 Type 2 diabetes mellitus without complications: Secondary | ICD-10-CM | POA: Insufficient documentation

## 2011-02-13 DIAGNOSIS — I2510 Atherosclerotic heart disease of native coronary artery without angina pectoris: Secondary | ICD-10-CM | POA: Insufficient documentation

## 2011-02-13 DIAGNOSIS — Z5189 Encounter for other specified aftercare: Secondary | ICD-10-CM | POA: Insufficient documentation

## 2011-02-13 DIAGNOSIS — R0602 Shortness of breath: Secondary | ICD-10-CM | POA: Insufficient documentation

## 2011-02-13 DIAGNOSIS — Z8249 Family history of ischemic heart disease and other diseases of the circulatory system: Secondary | ICD-10-CM | POA: Insufficient documentation

## 2011-02-13 DIAGNOSIS — E7800 Pure hypercholesterolemia, unspecified: Secondary | ICD-10-CM | POA: Insufficient documentation

## 2011-02-13 DIAGNOSIS — Z9861 Coronary angioplasty status: Secondary | ICD-10-CM | POA: Insufficient documentation

## 2011-02-13 DIAGNOSIS — R079 Chest pain, unspecified: Secondary | ICD-10-CM | POA: Insufficient documentation

## 2011-02-15 ENCOUNTER — Encounter (HOSPITAL_COMMUNITY): Payer: Self-pay

## 2011-02-16 ENCOUNTER — Encounter (HOSPITAL_COMMUNITY): Payer: Self-pay

## 2011-02-20 ENCOUNTER — Encounter (HOSPITAL_COMMUNITY): Payer: Self-pay

## 2011-02-22 ENCOUNTER — Encounter (HOSPITAL_COMMUNITY): Payer: Self-pay

## 2011-02-23 ENCOUNTER — Encounter (HOSPITAL_COMMUNITY): Payer: Self-pay

## 2011-02-27 ENCOUNTER — Encounter (HOSPITAL_COMMUNITY): Payer: Self-pay

## 2011-03-01 ENCOUNTER — Encounter (HOSPITAL_COMMUNITY): Payer: Self-pay

## 2011-03-02 ENCOUNTER — Encounter (HOSPITAL_COMMUNITY): Payer: Self-pay

## 2011-03-06 ENCOUNTER — Encounter (HOSPITAL_COMMUNITY): Payer: Self-pay

## 2011-03-08 ENCOUNTER — Encounter (HOSPITAL_COMMUNITY): Payer: Self-pay

## 2011-03-09 ENCOUNTER — Encounter (HOSPITAL_COMMUNITY): Payer: Self-pay

## 2011-03-13 ENCOUNTER — Encounter (HOSPITAL_COMMUNITY): Payer: Self-pay

## 2011-03-15 ENCOUNTER — Encounter (HOSPITAL_COMMUNITY): Payer: Self-pay | Attending: Interventional Cardiology

## 2011-03-15 DIAGNOSIS — I10 Essential (primary) hypertension: Secondary | ICD-10-CM | POA: Insufficient documentation

## 2011-03-15 DIAGNOSIS — Z8249 Family history of ischemic heart disease and other diseases of the circulatory system: Secondary | ICD-10-CM | POA: Insufficient documentation

## 2011-03-15 DIAGNOSIS — R079 Chest pain, unspecified: Secondary | ICD-10-CM | POA: Insufficient documentation

## 2011-03-15 DIAGNOSIS — E119 Type 2 diabetes mellitus without complications: Secondary | ICD-10-CM | POA: Insufficient documentation

## 2011-03-15 DIAGNOSIS — Z9861 Coronary angioplasty status: Secondary | ICD-10-CM | POA: Insufficient documentation

## 2011-03-15 DIAGNOSIS — E7800 Pure hypercholesterolemia, unspecified: Secondary | ICD-10-CM | POA: Insufficient documentation

## 2011-03-15 DIAGNOSIS — R0602 Shortness of breath: Secondary | ICD-10-CM | POA: Insufficient documentation

## 2011-03-15 DIAGNOSIS — I2510 Atherosclerotic heart disease of native coronary artery without angina pectoris: Secondary | ICD-10-CM | POA: Insufficient documentation

## 2011-03-15 DIAGNOSIS — Z5189 Encounter for other specified aftercare: Secondary | ICD-10-CM | POA: Insufficient documentation

## 2011-03-16 ENCOUNTER — Encounter (HOSPITAL_COMMUNITY): Payer: Self-pay

## 2011-03-20 ENCOUNTER — Encounter (HOSPITAL_COMMUNITY): Payer: Self-pay

## 2011-03-22 ENCOUNTER — Encounter (HOSPITAL_COMMUNITY): Payer: Self-pay

## 2011-03-23 ENCOUNTER — Encounter (HOSPITAL_COMMUNITY): Payer: Self-pay

## 2011-03-27 ENCOUNTER — Encounter (HOSPITAL_COMMUNITY): Payer: Self-pay

## 2011-03-29 ENCOUNTER — Encounter (HOSPITAL_COMMUNITY): Payer: Self-pay

## 2011-03-30 ENCOUNTER — Encounter (HOSPITAL_COMMUNITY): Payer: Self-pay

## 2011-04-03 ENCOUNTER — Encounter (HOSPITAL_COMMUNITY): Payer: Self-pay

## 2011-04-05 ENCOUNTER — Encounter (HOSPITAL_COMMUNITY): Payer: Self-pay

## 2011-04-06 ENCOUNTER — Encounter (HOSPITAL_COMMUNITY): Payer: Self-pay

## 2011-04-10 ENCOUNTER — Encounter (HOSPITAL_COMMUNITY): Payer: Self-pay

## 2011-04-12 ENCOUNTER — Encounter (HOSPITAL_COMMUNITY): Payer: Self-pay

## 2011-04-13 ENCOUNTER — Encounter (HOSPITAL_COMMUNITY): Payer: Self-pay

## 2011-04-17 ENCOUNTER — Encounter (HOSPITAL_COMMUNITY): Payer: Self-pay | Attending: Interventional Cardiology

## 2011-04-17 DIAGNOSIS — R0602 Shortness of breath: Secondary | ICD-10-CM | POA: Insufficient documentation

## 2011-04-17 DIAGNOSIS — I10 Essential (primary) hypertension: Secondary | ICD-10-CM | POA: Insufficient documentation

## 2011-04-17 DIAGNOSIS — R079 Chest pain, unspecified: Secondary | ICD-10-CM | POA: Insufficient documentation

## 2011-04-17 DIAGNOSIS — E7800 Pure hypercholesterolemia, unspecified: Secondary | ICD-10-CM | POA: Insufficient documentation

## 2011-04-17 DIAGNOSIS — Z8249 Family history of ischemic heart disease and other diseases of the circulatory system: Secondary | ICD-10-CM | POA: Insufficient documentation

## 2011-04-17 DIAGNOSIS — Z9861 Coronary angioplasty status: Secondary | ICD-10-CM | POA: Insufficient documentation

## 2011-04-17 DIAGNOSIS — Z5189 Encounter for other specified aftercare: Secondary | ICD-10-CM | POA: Insufficient documentation

## 2011-04-17 DIAGNOSIS — E119 Type 2 diabetes mellitus without complications: Secondary | ICD-10-CM | POA: Insufficient documentation

## 2011-04-17 DIAGNOSIS — I2510 Atherosclerotic heart disease of native coronary artery without angina pectoris: Secondary | ICD-10-CM | POA: Insufficient documentation

## 2011-04-19 ENCOUNTER — Encounter (HOSPITAL_COMMUNITY): Payer: Self-pay

## 2011-04-20 ENCOUNTER — Encounter (HOSPITAL_COMMUNITY): Payer: Self-pay

## 2011-04-24 ENCOUNTER — Encounter (HOSPITAL_COMMUNITY): Payer: Self-pay

## 2011-04-26 ENCOUNTER — Encounter (HOSPITAL_COMMUNITY): Payer: Self-pay

## 2011-04-27 ENCOUNTER — Encounter (HOSPITAL_COMMUNITY): Payer: Self-pay

## 2011-05-01 ENCOUNTER — Encounter (HOSPITAL_COMMUNITY): Payer: Self-pay

## 2011-05-03 ENCOUNTER — Encounter (HOSPITAL_COMMUNITY): Payer: Self-pay

## 2011-05-04 ENCOUNTER — Encounter (HOSPITAL_COMMUNITY): Payer: Self-pay

## 2011-05-08 ENCOUNTER — Encounter (HOSPITAL_COMMUNITY): Payer: Self-pay

## 2011-05-10 ENCOUNTER — Encounter (HOSPITAL_COMMUNITY): Payer: Self-pay

## 2011-05-11 ENCOUNTER — Encounter (HOSPITAL_COMMUNITY): Payer: Self-pay

## 2011-05-11 LAB — COMPREHENSIVE METABOLIC PANEL
ALT: 35
AST: 29
Albumin: 4.1
Alkaline Phosphatase: 54
BUN: 13
CO2: 27
Calcium: 9.4
Chloride: 101
Creatinine, Ser: 0.84
GFR calc Af Amer: >60
GFR calc non Af Amer: >60
Glucose, Bld: 130 — ABNORMAL HIGH
Potassium: 3.5
Sodium: 133 — ABNORMAL LOW
Total Bilirubin: 0.7
Total Protein: 7.2

## 2011-05-11 LAB — POCT CARDIAC MARKERS
CKMB, poc: <1 — ABNORMAL LOW
Myoglobin, poc: 54.3
Operator id: 234501
Troponin i, poc: <0.05

## 2011-05-11 LAB — DIFFERENTIAL
Basophils Absolute: 0
Basophils Relative: 0
Eosinophils Absolute: 0.7
Eosinophils Relative: 7 — ABNORMAL HIGH
Lymphocytes Relative: 21
Lymphs Abs: 2.1
Monocytes Absolute: 0.5
Monocytes Relative: 5
Neutro Abs: 6.6
Neutrophils Relative %: 66

## 2011-05-11 LAB — URINALYSIS, ROUTINE W REFLEX MICROSCOPIC
Bilirubin Urine: NEGATIVE
Glucose, UA: NEGATIVE
Hgb urine dipstick: NEGATIVE
Ketones, ur: NEGATIVE
Nitrite: NEGATIVE
Protein, ur: NEGATIVE
Specific Gravity, Urine: 1.013
Urobilinogen, UA: 0.2
pH: 5

## 2011-05-11 LAB — CBC
HCT: 35.1 — ABNORMAL LOW
Hemoglobin: 11.7 — ABNORMAL LOW
MCHC: 33.5
MCV: 87.6
Platelets: 323
RBC: 4.01
RDW: 14.7
WBC: 10

## 2011-05-11 LAB — B-NATRIURETIC PEPTIDE (CONVERTED LAB): Pro B Natriuretic peptide (BNP): <30

## 2011-05-15 ENCOUNTER — Encounter (HOSPITAL_COMMUNITY): Payer: Self-pay | Attending: Interventional Cardiology

## 2011-05-15 DIAGNOSIS — Z5189 Encounter for other specified aftercare: Secondary | ICD-10-CM | POA: Insufficient documentation

## 2011-05-15 DIAGNOSIS — R079 Chest pain, unspecified: Secondary | ICD-10-CM | POA: Insufficient documentation

## 2011-05-15 DIAGNOSIS — Z8249 Family history of ischemic heart disease and other diseases of the circulatory system: Secondary | ICD-10-CM | POA: Insufficient documentation

## 2011-05-15 DIAGNOSIS — R0602 Shortness of breath: Secondary | ICD-10-CM | POA: Insufficient documentation

## 2011-05-15 DIAGNOSIS — E119 Type 2 diabetes mellitus without complications: Secondary | ICD-10-CM | POA: Insufficient documentation

## 2011-05-15 DIAGNOSIS — I2510 Atherosclerotic heart disease of native coronary artery without angina pectoris: Secondary | ICD-10-CM | POA: Insufficient documentation

## 2011-05-15 DIAGNOSIS — E7800 Pure hypercholesterolemia, unspecified: Secondary | ICD-10-CM | POA: Insufficient documentation

## 2011-05-15 DIAGNOSIS — I10 Essential (primary) hypertension: Secondary | ICD-10-CM | POA: Insufficient documentation

## 2011-05-15 DIAGNOSIS — Z9861 Coronary angioplasty status: Secondary | ICD-10-CM | POA: Insufficient documentation

## 2011-05-17 ENCOUNTER — Encounter (HOSPITAL_COMMUNITY): Payer: Self-pay

## 2011-05-18 ENCOUNTER — Encounter (HOSPITAL_COMMUNITY): Payer: Self-pay

## 2011-05-18 LAB — CBC
HCT: 34.7 — ABNORMAL LOW
HCT: 36.4
Hemoglobin: 11.8 — ABNORMAL LOW
Hemoglobin: 12.4
MCHC: 34
MCHC: 34.1
MCV: 88.6
MCV: 89
Platelets: 321
Platelets: 322
RBC: 3.9
RBC: 4.11
RDW: 13.6
RDW: 14
WBC: 10.2
WBC: 14.8 — ABNORMAL HIGH

## 2011-05-18 LAB — COMPREHENSIVE METABOLIC PANEL
ALT: 36 — ABNORMAL HIGH
AST: 34
Albumin: 4
Alkaline Phosphatase: 55
BUN: 10
CO2: 29
Calcium: 9.1
Chloride: 95 — ABNORMAL LOW
Creatinine, Ser: 0.75
GFR calc Af Amer: >60
GFR calc non Af Amer: >60
Glucose, Bld: 179 — ABNORMAL HIGH
Potassium: 4.1
Sodium: 134 — ABNORMAL LOW
Total Bilirubin: 0.3
Total Protein: 6.8

## 2011-05-18 LAB — CARDIAC PANEL(CRET KIN+CKTOT+MB+TROPI)
CK, MB: 4.6 — ABNORMAL HIGH
CK, MB: 4.8 — ABNORMAL HIGH
Relative Index: 3.2 — ABNORMAL HIGH
Relative Index: 3.2 — ABNORMAL HIGH
Total CK: 142
Total CK: 150
Troponin I: 0.02
Troponin I: 0.04

## 2011-05-18 LAB — BASIC METABOLIC PANEL
BUN: 11
CO2: 26
Calcium: 8.8
Chloride: 99
Creatinine, Ser: 0.69
GFR calc Af Amer: >60
GFR calc non Af Amer: >60
Glucose, Bld: 176 — ABNORMAL HIGH
Potassium: 3.8
Sodium: 133 — ABNORMAL LOW

## 2011-05-18 LAB — LIPID PANEL
Cholesterol: 116
HDL: 41
LDL Cholesterol: 61
Total CHOL/HDL Ratio: 2.8
Triglycerides: 69
VLDL: 14

## 2011-05-18 LAB — PROTIME-INR
INR: 1
Prothrombin Time: 13.5

## 2011-05-18 LAB — TROPONIN I: Troponin I: 0.07 — ABNORMAL HIGH

## 2011-05-22 ENCOUNTER — Encounter (HOSPITAL_COMMUNITY): Payer: Self-pay

## 2011-05-24 ENCOUNTER — Encounter (HOSPITAL_COMMUNITY): Payer: Self-pay

## 2011-05-25 ENCOUNTER — Encounter (HOSPITAL_COMMUNITY): Payer: Self-pay

## 2011-05-29 ENCOUNTER — Encounter (HOSPITAL_COMMUNITY): Payer: Self-pay

## 2011-05-31 ENCOUNTER — Encounter (HOSPITAL_COMMUNITY): Payer: Self-pay

## 2011-06-01 ENCOUNTER — Encounter (HOSPITAL_COMMUNITY): Payer: Self-pay

## 2011-06-05 ENCOUNTER — Encounter (HOSPITAL_COMMUNITY): Payer: Self-pay

## 2011-06-07 ENCOUNTER — Encounter (HOSPITAL_COMMUNITY): Payer: Self-pay

## 2011-06-08 ENCOUNTER — Encounter (HOSPITAL_COMMUNITY): Payer: Self-pay

## 2011-06-12 ENCOUNTER — Encounter (HOSPITAL_COMMUNITY): Payer: Self-pay

## 2011-06-14 ENCOUNTER — Encounter (HOSPITAL_COMMUNITY): Payer: Self-pay

## 2011-06-14 DIAGNOSIS — R0602 Shortness of breath: Secondary | ICD-10-CM | POA: Insufficient documentation

## 2011-06-14 DIAGNOSIS — I10 Essential (primary) hypertension: Secondary | ICD-10-CM | POA: Insufficient documentation

## 2011-06-14 DIAGNOSIS — I2510 Atherosclerotic heart disease of native coronary artery without angina pectoris: Secondary | ICD-10-CM | POA: Insufficient documentation

## 2011-06-14 DIAGNOSIS — E7800 Pure hypercholesterolemia, unspecified: Secondary | ICD-10-CM | POA: Insufficient documentation

## 2011-06-14 DIAGNOSIS — Z5189 Encounter for other specified aftercare: Secondary | ICD-10-CM | POA: Insufficient documentation

## 2011-06-14 DIAGNOSIS — E119 Type 2 diabetes mellitus without complications: Secondary | ICD-10-CM | POA: Insufficient documentation

## 2011-06-14 DIAGNOSIS — R079 Chest pain, unspecified: Secondary | ICD-10-CM | POA: Insufficient documentation

## 2011-06-14 DIAGNOSIS — Z8249 Family history of ischemic heart disease and other diseases of the circulatory system: Secondary | ICD-10-CM | POA: Insufficient documentation

## 2011-06-14 DIAGNOSIS — Z9861 Coronary angioplasty status: Secondary | ICD-10-CM | POA: Insufficient documentation

## 2011-06-15 ENCOUNTER — Encounter (HOSPITAL_COMMUNITY): Payer: Self-pay

## 2011-06-19 ENCOUNTER — Encounter (HOSPITAL_COMMUNITY): Payer: Self-pay

## 2011-06-21 ENCOUNTER — Encounter (HOSPITAL_COMMUNITY): Payer: Self-pay

## 2011-06-22 ENCOUNTER — Encounter (HOSPITAL_COMMUNITY): Payer: Self-pay

## 2011-06-26 ENCOUNTER — Encounter (HOSPITAL_COMMUNITY): Payer: Self-pay

## 2011-06-28 ENCOUNTER — Encounter (HOSPITAL_COMMUNITY): Payer: Self-pay

## 2011-06-29 ENCOUNTER — Encounter (HOSPITAL_COMMUNITY): Payer: Self-pay

## 2011-07-03 ENCOUNTER — Encounter (HOSPITAL_COMMUNITY): Payer: Self-pay

## 2011-07-05 ENCOUNTER — Encounter (HOSPITAL_COMMUNITY): Payer: Self-pay

## 2011-07-06 ENCOUNTER — Encounter (HOSPITAL_COMMUNITY): Payer: Self-pay

## 2011-07-10 ENCOUNTER — Encounter (HOSPITAL_COMMUNITY)
Admission: RE | Admit: 2011-07-10 | Discharge: 2011-07-10 | Disposition: A | Discharge status: Home / Self Care | Payer: Self-pay | Source: Ambulatory Visit | Attending: Interventional Cardiology | Admitting: Interventional Cardiology

## 2011-07-12 ENCOUNTER — Encounter (HOSPITAL_COMMUNITY)
Admission: RE | Admit: 2011-07-12 | Discharge: 2011-07-12 | Disposition: A | Discharge status: Home / Self Care | Payer: Self-pay | Source: Ambulatory Visit | Attending: Interventional Cardiology | Admitting: Interventional Cardiology

## 2011-07-13 ENCOUNTER — Encounter (HOSPITAL_COMMUNITY)
Admission: RE | Admit: 2011-07-13 | Discharge: 2011-07-13 | Disposition: A | Discharge status: Home / Self Care | Payer: Self-pay | Source: Ambulatory Visit | Attending: Interventional Cardiology | Admitting: Interventional Cardiology

## 2011-07-17 ENCOUNTER — Encounter (HOSPITAL_COMMUNITY): Payer: Self-pay

## 2011-07-17 DIAGNOSIS — R079 Chest pain, unspecified: Secondary | ICD-10-CM | POA: Insufficient documentation

## 2011-07-17 DIAGNOSIS — R0602 Shortness of breath: Secondary | ICD-10-CM | POA: Insufficient documentation

## 2011-07-17 DIAGNOSIS — Z5189 Encounter for other specified aftercare: Secondary | ICD-10-CM | POA: Insufficient documentation

## 2011-07-17 DIAGNOSIS — I10 Essential (primary) hypertension: Secondary | ICD-10-CM | POA: Insufficient documentation

## 2011-07-17 DIAGNOSIS — E119 Type 2 diabetes mellitus without complications: Secondary | ICD-10-CM | POA: Insufficient documentation

## 2011-07-17 DIAGNOSIS — I2510 Atherosclerotic heart disease of native coronary artery without angina pectoris: Secondary | ICD-10-CM | POA: Insufficient documentation

## 2011-07-17 DIAGNOSIS — Z9861 Coronary angioplasty status: Secondary | ICD-10-CM | POA: Insufficient documentation

## 2011-07-17 DIAGNOSIS — Z8249 Family history of ischemic heart disease and other diseases of the circulatory system: Secondary | ICD-10-CM | POA: Insufficient documentation

## 2011-07-17 DIAGNOSIS — E7800 Pure hypercholesterolemia, unspecified: Secondary | ICD-10-CM | POA: Insufficient documentation

## 2011-07-19 ENCOUNTER — Encounter (HOSPITAL_COMMUNITY): Payer: Self-pay

## 2011-07-20 ENCOUNTER — Encounter (HOSPITAL_COMMUNITY): Payer: Self-pay

## 2011-07-24 ENCOUNTER — Encounter (HOSPITAL_COMMUNITY)
Admission: RE | Admit: 2011-07-24 | Discharge: 2011-07-24 | Disposition: A | Discharge status: Home / Self Care | Payer: Self-pay | Source: Ambulatory Visit | Attending: Interventional Cardiology | Admitting: Interventional Cardiology

## 2011-07-26 ENCOUNTER — Encounter (HOSPITAL_COMMUNITY): Payer: Self-pay

## 2011-07-27 ENCOUNTER — Encounter (HOSPITAL_COMMUNITY): Payer: Self-pay

## 2011-07-31 ENCOUNTER — Encounter (HOSPITAL_COMMUNITY): Payer: Self-pay

## 2011-08-02 ENCOUNTER — Encounter (HOSPITAL_COMMUNITY): Payer: Self-pay

## 2011-08-03 ENCOUNTER — Encounter (HOSPITAL_COMMUNITY): Payer: Self-pay

## 2011-08-07 ENCOUNTER — Encounter (HOSPITAL_COMMUNITY): Payer: Self-pay

## 2011-08-09 ENCOUNTER — Encounter (HOSPITAL_COMMUNITY): Payer: Self-pay

## 2011-08-10 ENCOUNTER — Encounter (HOSPITAL_COMMUNITY): Payer: Self-pay

## 2011-08-14 ENCOUNTER — Encounter (HOSPITAL_COMMUNITY): Payer: Self-pay

## 2011-08-15 ENCOUNTER — Other Ambulatory Visit (HOSPITAL_COMMUNITY)
Admission: RE | Admit: 2011-08-15 | Discharge: 2011-08-15 | Disposition: A | Discharge status: Home / Self Care | Payer: Medicare Other | Source: Ambulatory Visit | Attending: Family Medicine | Admitting: Family Medicine

## 2011-08-15 ENCOUNTER — Other Ambulatory Visit: Payer: Self-pay | Admitting: Family Medicine

## 2011-08-15 DIAGNOSIS — Z0000 Encounter for general adult medical examination without abnormal findings: Secondary | ICD-10-CM | POA: Insufficient documentation

## 2011-08-16 ENCOUNTER — Encounter (HOSPITAL_COMMUNITY)
Admission: RE | Admit: 2011-08-16 | Discharge: 2011-08-16 | Disposition: A | Discharge status: Home / Self Care | Payer: Self-pay | Source: Ambulatory Visit | Attending: Interventional Cardiology | Admitting: Interventional Cardiology

## 2011-08-16 DIAGNOSIS — R0602 Shortness of breath: Secondary | ICD-10-CM | POA: Insufficient documentation

## 2011-08-16 DIAGNOSIS — E119 Type 2 diabetes mellitus without complications: Secondary | ICD-10-CM | POA: Insufficient documentation

## 2011-08-16 DIAGNOSIS — I2510 Atherosclerotic heart disease of native coronary artery without angina pectoris: Secondary | ICD-10-CM | POA: Insufficient documentation

## 2011-08-16 DIAGNOSIS — Z8249 Family history of ischemic heart disease and other diseases of the circulatory system: Secondary | ICD-10-CM | POA: Insufficient documentation

## 2011-08-16 DIAGNOSIS — E7800 Pure hypercholesterolemia, unspecified: Secondary | ICD-10-CM | POA: Insufficient documentation

## 2011-08-16 DIAGNOSIS — R079 Chest pain, unspecified: Secondary | ICD-10-CM | POA: Insufficient documentation

## 2011-08-16 DIAGNOSIS — I10 Essential (primary) hypertension: Secondary | ICD-10-CM | POA: Insufficient documentation

## 2011-08-16 DIAGNOSIS — Z9861 Coronary angioplasty status: Secondary | ICD-10-CM | POA: Insufficient documentation

## 2011-08-16 DIAGNOSIS — Z5189 Encounter for other specified aftercare: Secondary | ICD-10-CM | POA: Insufficient documentation

## 2011-08-17 ENCOUNTER — Encounter (HOSPITAL_COMMUNITY)
Admission: RE | Admit: 2011-08-17 | Discharge: 2011-08-17 | Disposition: A | Discharge status: Home / Self Care | Payer: Self-pay | Source: Ambulatory Visit | Attending: Interventional Cardiology | Admitting: Interventional Cardiology

## 2011-08-21 ENCOUNTER — Encounter (HOSPITAL_COMMUNITY)
Admission: RE | Admit: 2011-08-21 | Discharge: 2011-08-21 | Disposition: A | Discharge status: Home / Self Care | Payer: Self-pay | Source: Ambulatory Visit | Attending: Interventional Cardiology | Admitting: Interventional Cardiology

## 2011-08-23 ENCOUNTER — Encounter (HOSPITAL_COMMUNITY): Payer: Self-pay

## 2011-08-24 ENCOUNTER — Encounter (HOSPITAL_COMMUNITY)
Admission: RE | Admit: 2011-08-24 | Discharge: 2011-08-24 | Disposition: A | Discharge status: Home / Self Care | Payer: Self-pay | Source: Ambulatory Visit | Attending: Interventional Cardiology | Admitting: Interventional Cardiology

## 2011-08-28 ENCOUNTER — Encounter (HOSPITAL_COMMUNITY)
Admission: RE | Admit: 2011-08-28 | Discharge: 2011-08-28 | Disposition: A | Discharge status: Home / Self Care | Payer: Self-pay | Source: Ambulatory Visit | Attending: Interventional Cardiology | Admitting: Interventional Cardiology

## 2011-08-28 NOTE — Progress Notes (Signed)
0800- Patient brought updated medication list with med change. Klor-Con M20 d/c'd, Spironolactone 25 mg once daily added.

## 2011-08-30 ENCOUNTER — Encounter (HOSPITAL_COMMUNITY)
Admission: RE | Admit: 2011-08-30 | Discharge: 2011-08-30 | Disposition: A | Discharge status: Home / Self Care | Payer: Self-pay | Source: Ambulatory Visit | Attending: Interventional Cardiology | Admitting: Interventional Cardiology

## 2011-08-31 ENCOUNTER — Encounter (HOSPITAL_COMMUNITY): Payer: Self-pay

## 2011-09-04 ENCOUNTER — Encounter (HOSPITAL_COMMUNITY)
Admission: RE | Admit: 2011-09-04 | Discharge: 2011-09-04 | Disposition: A | Discharge status: Home / Self Care | Payer: Self-pay | Source: Ambulatory Visit | Attending: Interventional Cardiology | Admitting: Interventional Cardiology

## 2011-09-06 ENCOUNTER — Encounter (HOSPITAL_COMMUNITY): Payer: Self-pay

## 2011-09-07 ENCOUNTER — Encounter (HOSPITAL_COMMUNITY): Payer: Self-pay

## 2011-09-11 ENCOUNTER — Encounter (HOSPITAL_COMMUNITY): Payer: Self-pay

## 2011-09-13 ENCOUNTER — Encounter (HOSPITAL_COMMUNITY): Payer: Self-pay

## 2011-09-14 ENCOUNTER — Encounter (HOSPITAL_COMMUNITY): Payer: Self-pay

## 2011-09-14 DIAGNOSIS — R079 Chest pain, unspecified: Secondary | ICD-10-CM | POA: Insufficient documentation

## 2011-09-14 DIAGNOSIS — R0602 Shortness of breath: Secondary | ICD-10-CM | POA: Insufficient documentation

## 2011-09-14 DIAGNOSIS — I2510 Atherosclerotic heart disease of native coronary artery without angina pectoris: Secondary | ICD-10-CM | POA: Insufficient documentation

## 2011-09-14 DIAGNOSIS — E7800 Pure hypercholesterolemia, unspecified: Secondary | ICD-10-CM | POA: Insufficient documentation

## 2011-09-14 DIAGNOSIS — Z9861 Coronary angioplasty status: Secondary | ICD-10-CM | POA: Insufficient documentation

## 2011-09-14 DIAGNOSIS — Z8249 Family history of ischemic heart disease and other diseases of the circulatory system: Secondary | ICD-10-CM | POA: Insufficient documentation

## 2011-09-14 DIAGNOSIS — E119 Type 2 diabetes mellitus without complications: Secondary | ICD-10-CM | POA: Insufficient documentation

## 2011-09-14 DIAGNOSIS — I10 Essential (primary) hypertension: Secondary | ICD-10-CM | POA: Insufficient documentation

## 2011-09-14 DIAGNOSIS — Z5189 Encounter for other specified aftercare: Secondary | ICD-10-CM | POA: Insufficient documentation

## 2011-09-18 ENCOUNTER — Encounter (HOSPITAL_COMMUNITY): Payer: Self-pay

## 2011-09-20 ENCOUNTER — Encounter (HOSPITAL_COMMUNITY): Payer: Self-pay

## 2011-09-21 ENCOUNTER — Encounter (HOSPITAL_COMMUNITY): Payer: Self-pay

## 2011-09-25 ENCOUNTER — Encounter (HOSPITAL_COMMUNITY): Payer: Self-pay

## 2011-09-27 ENCOUNTER — Encounter (HOSPITAL_COMMUNITY): Payer: Self-pay

## 2011-09-28 ENCOUNTER — Encounter (HOSPITAL_COMMUNITY): Payer: Self-pay

## 2011-10-02 ENCOUNTER — Encounter (HOSPITAL_COMMUNITY)
Admission: RE | Admit: 2011-10-02 | Discharge: 2011-10-02 | Disposition: A | Discharge status: Home / Self Care | Payer: Self-pay | Source: Ambulatory Visit | Attending: Interventional Cardiology | Admitting: Interventional Cardiology

## 2011-10-04 ENCOUNTER — Encounter (HOSPITAL_COMMUNITY): Payer: Self-pay

## 2011-10-05 ENCOUNTER — Encounter (HOSPITAL_COMMUNITY): Payer: Self-pay

## 2011-10-09 ENCOUNTER — Encounter (HOSPITAL_COMMUNITY): Payer: Self-pay

## 2011-10-11 ENCOUNTER — Encounter (HOSPITAL_COMMUNITY): Payer: Self-pay

## 2011-10-12 ENCOUNTER — Encounter (HOSPITAL_COMMUNITY): Payer: Self-pay

## 2011-10-12 DIAGNOSIS — I10 Essential (primary) hypertension: Secondary | ICD-10-CM | POA: Insufficient documentation

## 2011-10-12 DIAGNOSIS — E7800 Pure hypercholesterolemia, unspecified: Secondary | ICD-10-CM | POA: Insufficient documentation

## 2011-10-12 DIAGNOSIS — R0602 Shortness of breath: Secondary | ICD-10-CM | POA: Insufficient documentation

## 2011-10-12 DIAGNOSIS — I2510 Atherosclerotic heart disease of native coronary artery without angina pectoris: Secondary | ICD-10-CM | POA: Insufficient documentation

## 2011-10-12 DIAGNOSIS — Z8249 Family history of ischemic heart disease and other diseases of the circulatory system: Secondary | ICD-10-CM | POA: Insufficient documentation

## 2011-10-12 DIAGNOSIS — Z5189 Encounter for other specified aftercare: Secondary | ICD-10-CM | POA: Insufficient documentation

## 2011-10-12 DIAGNOSIS — Z9861 Coronary angioplasty status: Secondary | ICD-10-CM | POA: Insufficient documentation

## 2011-10-12 DIAGNOSIS — R079 Chest pain, unspecified: Secondary | ICD-10-CM | POA: Insufficient documentation

## 2011-10-12 DIAGNOSIS — E119 Type 2 diabetes mellitus without complications: Secondary | ICD-10-CM | POA: Insufficient documentation

## 2011-10-16 ENCOUNTER — Encounter (HOSPITAL_COMMUNITY): Payer: Self-pay

## 2011-10-18 ENCOUNTER — Encounter (HOSPITAL_COMMUNITY): Payer: Self-pay

## 2011-10-19 ENCOUNTER — Encounter (HOSPITAL_COMMUNITY): Payer: Self-pay

## 2011-10-23 ENCOUNTER — Encounter (HOSPITAL_COMMUNITY): Payer: Self-pay

## 2011-10-25 ENCOUNTER — Encounter (HOSPITAL_COMMUNITY)
Admission: RE | Admit: 2011-10-25 | Discharge: 2011-10-25 | Disposition: A | Discharge status: Home / Self Care | Payer: Self-pay | Source: Ambulatory Visit | Attending: Interventional Cardiology | Admitting: Interventional Cardiology

## 2011-10-26 ENCOUNTER — Encounter (HOSPITAL_COMMUNITY)
Admission: RE | Admit: 2011-10-26 | Discharge: 2011-10-26 | Disposition: A | Discharge status: Home / Self Care | Payer: Self-pay | Source: Ambulatory Visit | Attending: Interventional Cardiology | Admitting: Interventional Cardiology

## 2011-10-30 ENCOUNTER — Encounter (HOSPITAL_COMMUNITY)
Admission: RE | Admit: 2011-10-30 | Discharge: 2011-10-30 | Disposition: A | Discharge status: Home / Self Care | Payer: Self-pay | Source: Ambulatory Visit | Attending: Interventional Cardiology | Admitting: Interventional Cardiology

## 2011-11-01 ENCOUNTER — Encounter (HOSPITAL_COMMUNITY)
Admission: RE | Admit: 2011-11-01 | Discharge: 2011-11-01 | Disposition: A | Discharge status: Home / Self Care | Payer: Self-pay | Source: Ambulatory Visit | Attending: Interventional Cardiology | Admitting: Interventional Cardiology

## 2011-11-02 ENCOUNTER — Encounter (HOSPITAL_COMMUNITY)
Admission: RE | Admit: 2011-11-02 | Discharge: 2011-11-02 | Disposition: A | Discharge status: Home / Self Care | Payer: Self-pay | Source: Ambulatory Visit | Attending: Interventional Cardiology | Admitting: Interventional Cardiology

## 2011-11-06 ENCOUNTER — Encounter (HOSPITAL_COMMUNITY)
Admission: RE | Admit: 2011-11-06 | Discharge: 2011-11-06 | Disposition: A | Discharge status: Home / Self Care | Payer: Self-pay | Source: Ambulatory Visit | Attending: Interventional Cardiology | Admitting: Interventional Cardiology

## 2011-11-08 ENCOUNTER — Encounter (HOSPITAL_COMMUNITY): Payer: Self-pay

## 2011-11-09 ENCOUNTER — Encounter (HOSPITAL_COMMUNITY): Payer: Self-pay

## 2011-11-13 ENCOUNTER — Encounter (HOSPITAL_COMMUNITY)
Admission: RE | Admit: 2011-11-13 | Discharge: 2011-11-13 | Disposition: A | Discharge status: Home / Self Care | Payer: Self-pay | Source: Ambulatory Visit | Attending: Interventional Cardiology | Admitting: Interventional Cardiology

## 2011-11-13 DIAGNOSIS — E119 Type 2 diabetes mellitus without complications: Secondary | ICD-10-CM | POA: Insufficient documentation

## 2011-11-13 DIAGNOSIS — I10 Essential (primary) hypertension: Secondary | ICD-10-CM | POA: Insufficient documentation

## 2011-11-13 DIAGNOSIS — E7800 Pure hypercholesterolemia, unspecified: Secondary | ICD-10-CM | POA: Insufficient documentation

## 2011-11-13 DIAGNOSIS — Z5189 Encounter for other specified aftercare: Secondary | ICD-10-CM | POA: Insufficient documentation

## 2011-11-13 DIAGNOSIS — I2510 Atherosclerotic heart disease of native coronary artery without angina pectoris: Secondary | ICD-10-CM | POA: Insufficient documentation

## 2011-11-13 DIAGNOSIS — R0602 Shortness of breath: Secondary | ICD-10-CM | POA: Insufficient documentation

## 2011-11-13 DIAGNOSIS — Z9861 Coronary angioplasty status: Secondary | ICD-10-CM | POA: Insufficient documentation

## 2011-11-13 DIAGNOSIS — Z8249 Family history of ischemic heart disease and other diseases of the circulatory system: Secondary | ICD-10-CM | POA: Insufficient documentation

## 2011-11-13 DIAGNOSIS — R079 Chest pain, unspecified: Secondary | ICD-10-CM | POA: Insufficient documentation

## 2011-11-15 ENCOUNTER — Encounter (HOSPITAL_COMMUNITY)
Admission: RE | Admit: 2011-11-15 | Discharge: 2011-11-15 | Disposition: A | Discharge status: Home / Self Care | Payer: Self-pay | Source: Ambulatory Visit | Attending: Interventional Cardiology | Admitting: Interventional Cardiology

## 2011-11-16 ENCOUNTER — Encounter (HOSPITAL_COMMUNITY)
Admission: RE | Admit: 2011-11-16 | Discharge: 2011-11-16 | Disposition: A | Discharge status: Home / Self Care | Payer: Self-pay | Source: Ambulatory Visit | Attending: Interventional Cardiology | Admitting: Interventional Cardiology

## 2011-11-20 ENCOUNTER — Encounter (HOSPITAL_COMMUNITY): Payer: Self-pay

## 2011-11-20 ENCOUNTER — Other Ambulatory Visit: Payer: Self-pay | Admitting: Family Medicine

## 2011-11-20 DIAGNOSIS — Z1231 Encounter for screening mammogram for malignant neoplasm of breast: Secondary | ICD-10-CM

## 2011-11-22 ENCOUNTER — Encounter (HOSPITAL_COMMUNITY)
Admission: RE | Admit: 2011-11-22 | Discharge: 2011-11-22 | Disposition: A | Discharge status: Home / Self Care | Payer: Self-pay | Source: Ambulatory Visit | Attending: Interventional Cardiology | Admitting: Interventional Cardiology

## 2011-11-23 ENCOUNTER — Encounter (HOSPITAL_COMMUNITY): Payer: Self-pay

## 2011-11-27 ENCOUNTER — Encounter (HOSPITAL_COMMUNITY)
Admission: RE | Admit: 2011-11-27 | Discharge: 2011-11-27 | Disposition: A | Discharge status: Home / Self Care | Payer: Self-pay | Source: Ambulatory Visit | Attending: Interventional Cardiology | Admitting: Interventional Cardiology

## 2011-11-29 ENCOUNTER — Encounter (HOSPITAL_COMMUNITY): Payer: Self-pay

## 2011-11-30 ENCOUNTER — Encounter (HOSPITAL_COMMUNITY): Payer: Self-pay

## 2011-12-04 ENCOUNTER — Encounter (HOSPITAL_COMMUNITY)
Admission: RE | Admit: 2011-12-04 | Discharge: 2011-12-04 | Disposition: A | Discharge status: Home / Self Care | Payer: Self-pay | Source: Ambulatory Visit | Attending: Interventional Cardiology | Admitting: Interventional Cardiology

## 2011-12-06 ENCOUNTER — Encounter (HOSPITAL_COMMUNITY): Payer: Self-pay

## 2011-12-07 ENCOUNTER — Encounter (HOSPITAL_COMMUNITY): Payer: Medicare Other

## 2011-12-07 ENCOUNTER — Ambulatory Visit
Admission: RE | Admit: 2011-12-07 | Discharge: 2011-12-07 | Disposition: A | Discharge status: Home / Self Care | Payer: Medicare Other | Source: Ambulatory Visit | Attending: Family Medicine | Admitting: Family Medicine

## 2011-12-07 DIAGNOSIS — Z1231 Encounter for screening mammogram for malignant neoplasm of breast: Secondary | ICD-10-CM

## 2011-12-11 ENCOUNTER — Encounter (HOSPITAL_COMMUNITY)
Admission: RE | Admit: 2011-12-11 | Discharge: 2011-12-11 | Disposition: A | Discharge status: Home / Self Care | Payer: Self-pay | Source: Ambulatory Visit | Attending: Interventional Cardiology | Admitting: Interventional Cardiology

## 2011-12-13 ENCOUNTER — Encounter (HOSPITAL_COMMUNITY): Payer: Self-pay

## 2011-12-13 DIAGNOSIS — Z8249 Family history of ischemic heart disease and other diseases of the circulatory system: Secondary | ICD-10-CM | POA: Insufficient documentation

## 2011-12-13 DIAGNOSIS — R079 Chest pain, unspecified: Secondary | ICD-10-CM | POA: Insufficient documentation

## 2011-12-13 DIAGNOSIS — Z5189 Encounter for other specified aftercare: Secondary | ICD-10-CM | POA: Insufficient documentation

## 2011-12-13 DIAGNOSIS — E7800 Pure hypercholesterolemia, unspecified: Secondary | ICD-10-CM | POA: Insufficient documentation

## 2011-12-13 DIAGNOSIS — Z9861 Coronary angioplasty status: Secondary | ICD-10-CM | POA: Insufficient documentation

## 2011-12-13 DIAGNOSIS — I10 Essential (primary) hypertension: Secondary | ICD-10-CM | POA: Insufficient documentation

## 2011-12-13 DIAGNOSIS — R0602 Shortness of breath: Secondary | ICD-10-CM | POA: Insufficient documentation

## 2011-12-13 DIAGNOSIS — I2510 Atherosclerotic heart disease of native coronary artery without angina pectoris: Secondary | ICD-10-CM | POA: Insufficient documentation

## 2011-12-13 DIAGNOSIS — E119 Type 2 diabetes mellitus without complications: Secondary | ICD-10-CM | POA: Insufficient documentation

## 2011-12-14 ENCOUNTER — Encounter (HOSPITAL_COMMUNITY): Payer: Self-pay

## 2011-12-18 ENCOUNTER — Encounter (HOSPITAL_COMMUNITY): Payer: Self-pay

## 2011-12-20 ENCOUNTER — Encounter (HOSPITAL_COMMUNITY)
Admission: RE | Admit: 2011-12-20 | Discharge: 2011-12-20 | Disposition: A | Discharge status: Home / Self Care | Payer: Self-pay | Source: Ambulatory Visit | Attending: Interventional Cardiology | Admitting: Interventional Cardiology

## 2011-12-21 ENCOUNTER — Encounter (HOSPITAL_COMMUNITY)
Admission: RE | Admit: 2011-12-21 | Discharge: 2011-12-21 | Disposition: A | Discharge status: Home / Self Care | Payer: Self-pay | Source: Ambulatory Visit | Attending: Interventional Cardiology | Admitting: Interventional Cardiology

## 2011-12-25 ENCOUNTER — Encounter (HOSPITAL_COMMUNITY)
Admission: RE | Admit: 2011-12-25 | Discharge: 2011-12-25 | Disposition: A | Discharge status: Home / Self Care | Payer: Self-pay | Source: Ambulatory Visit | Attending: Interventional Cardiology | Admitting: Interventional Cardiology

## 2011-12-27 ENCOUNTER — Encounter (HOSPITAL_COMMUNITY): Payer: Self-pay

## 2011-12-28 ENCOUNTER — Encounter (HOSPITAL_COMMUNITY): Payer: Self-pay

## 2012-01-01 ENCOUNTER — Encounter (HOSPITAL_COMMUNITY)
Admission: RE | Admit: 2012-01-01 | Discharge: 2012-01-01 | Disposition: A | Discharge status: Home / Self Care | Payer: Self-pay | Source: Ambulatory Visit | Attending: Interventional Cardiology | Admitting: Interventional Cardiology

## 2012-01-03 ENCOUNTER — Other Ambulatory Visit: Payer: Self-pay | Admitting: Family Medicine

## 2012-01-03 ENCOUNTER — Encounter (HOSPITAL_COMMUNITY): Payer: Self-pay

## 2012-01-03 DIAGNOSIS — M5416 Radiculopathy, lumbar region: Secondary | ICD-10-CM

## 2012-01-04 ENCOUNTER — Other Ambulatory Visit: Payer: Self-pay | Admitting: Family Medicine

## 2012-01-04 ENCOUNTER — Encounter (HOSPITAL_COMMUNITY)
Admission: RE | Admit: 2012-01-04 | Discharge: 2012-01-04 | Disposition: A | Discharge status: Home / Self Care | Payer: Self-pay | Source: Ambulatory Visit | Attending: Interventional Cardiology | Admitting: Interventional Cardiology

## 2012-01-04 ENCOUNTER — Other Ambulatory Visit: Payer: Medicare Other

## 2012-01-04 ENCOUNTER — Ambulatory Visit
Admission: RE | Admit: 2012-01-04 | Discharge: 2012-01-04 | Disposition: A | Discharge status: Home / Self Care | Payer: Medicare Other | Source: Ambulatory Visit | Attending: Family Medicine | Admitting: Family Medicine

## 2012-01-04 DIAGNOSIS — M79609 Pain in unspecified limb: Secondary | ICD-10-CM

## 2012-01-08 ENCOUNTER — Encounter (HOSPITAL_COMMUNITY): Payer: Self-pay

## 2012-01-10 ENCOUNTER — Encounter (HOSPITAL_COMMUNITY)
Admission: RE | Admit: 2012-01-10 | Discharge: 2012-01-10 | Disposition: A | Discharge status: Home / Self Care | Payer: Self-pay | Source: Ambulatory Visit | Attending: Interventional Cardiology | Admitting: Interventional Cardiology

## 2012-01-11 ENCOUNTER — Encounter (HOSPITAL_COMMUNITY)
Admission: RE | Admit: 2012-01-11 | Discharge: 2012-01-11 | Disposition: A | Discharge status: Home / Self Care | Payer: Self-pay | Source: Ambulatory Visit | Attending: Interventional Cardiology | Admitting: Interventional Cardiology

## 2012-01-12 ENCOUNTER — Ambulatory Visit
Admission: RE | Admit: 2012-01-12 | Discharge: 2012-01-12 | Disposition: A | Discharge status: Home / Self Care | Payer: Medicare Other | Source: Ambulatory Visit | Attending: Family Medicine | Admitting: Family Medicine

## 2012-01-12 DIAGNOSIS — M5416 Radiculopathy, lumbar region: Secondary | ICD-10-CM

## 2012-01-15 ENCOUNTER — Encounter (HOSPITAL_COMMUNITY)
Admission: RE | Admit: 2012-01-15 | Discharge: 2012-01-15 | Disposition: A | Discharge status: Home / Self Care | Payer: Self-pay | Source: Ambulatory Visit | Attending: Interventional Cardiology | Admitting: Interventional Cardiology

## 2012-01-15 DIAGNOSIS — R0602 Shortness of breath: Secondary | ICD-10-CM | POA: Insufficient documentation

## 2012-01-15 DIAGNOSIS — Z8249 Family history of ischemic heart disease and other diseases of the circulatory system: Secondary | ICD-10-CM | POA: Insufficient documentation

## 2012-01-15 DIAGNOSIS — I2510 Atherosclerotic heart disease of native coronary artery without angina pectoris: Secondary | ICD-10-CM | POA: Insufficient documentation

## 2012-01-15 DIAGNOSIS — R079 Chest pain, unspecified: Secondary | ICD-10-CM | POA: Insufficient documentation

## 2012-01-15 DIAGNOSIS — I10 Essential (primary) hypertension: Secondary | ICD-10-CM | POA: Insufficient documentation

## 2012-01-15 DIAGNOSIS — E119 Type 2 diabetes mellitus without complications: Secondary | ICD-10-CM | POA: Insufficient documentation

## 2012-01-15 DIAGNOSIS — Z5189 Encounter for other specified aftercare: Secondary | ICD-10-CM | POA: Insufficient documentation

## 2012-01-15 DIAGNOSIS — E7800 Pure hypercholesterolemia, unspecified: Secondary | ICD-10-CM | POA: Insufficient documentation

## 2012-01-15 DIAGNOSIS — Z9861 Coronary angioplasty status: Secondary | ICD-10-CM | POA: Insufficient documentation

## 2012-01-15 NOTE — Progress Notes (Signed)
Pt's weight is up 1.5 kg today from Friday.Pt denies SOB, denies edema. Per pt, she has been on Prednisone x 12 days for hip/back pain. Pt c/o some mild left knee pain as well today.

## 2012-01-17 ENCOUNTER — Encounter (HOSPITAL_COMMUNITY)
Admission: RE | Admit: 2012-01-17 | Discharge: 2012-01-17 | Disposition: A | Discharge status: Home / Self Care | Payer: Self-pay | Source: Ambulatory Visit | Attending: Interventional Cardiology | Admitting: Interventional Cardiology

## 2012-01-18 ENCOUNTER — Encounter (HOSPITAL_COMMUNITY): Payer: Self-pay

## 2012-01-22 ENCOUNTER — Encounter (HOSPITAL_COMMUNITY): Payer: Self-pay

## 2012-01-24 ENCOUNTER — Encounter (HOSPITAL_COMMUNITY): Payer: Self-pay

## 2012-01-25 ENCOUNTER — Encounter (HOSPITAL_COMMUNITY): Payer: Self-pay

## 2012-01-29 ENCOUNTER — Encounter (HOSPITAL_COMMUNITY): Payer: Self-pay

## 2012-01-31 ENCOUNTER — Encounter (HOSPITAL_COMMUNITY): Payer: Self-pay

## 2012-02-01 ENCOUNTER — Encounter (HOSPITAL_COMMUNITY): Payer: Self-pay

## 2012-02-05 ENCOUNTER — Encounter (HOSPITAL_COMMUNITY): Payer: Self-pay

## 2012-02-07 ENCOUNTER — Encounter (HOSPITAL_COMMUNITY)
Admission: RE | Admit: 2012-02-07 | Discharge: 2012-02-07 | Disposition: A | Discharge status: Home / Self Care | Payer: Self-pay | Source: Ambulatory Visit | Attending: Interventional Cardiology | Admitting: Interventional Cardiology

## 2012-02-08 ENCOUNTER — Encounter (HOSPITAL_COMMUNITY)
Admission: RE | Admit: 2012-02-08 | Discharge: 2012-02-08 | Disposition: A | Discharge status: Home / Self Care | Payer: Self-pay | Source: Ambulatory Visit | Attending: Interventional Cardiology | Admitting: Interventional Cardiology

## 2012-02-12 ENCOUNTER — Encounter (HOSPITAL_COMMUNITY)
Admission: RE | Admit: 2012-02-12 | Discharge: 2012-02-12 | Disposition: A | Discharge status: Home / Self Care | Payer: Self-pay | Source: Ambulatory Visit | Attending: Interventional Cardiology | Admitting: Interventional Cardiology

## 2012-02-12 DIAGNOSIS — I2510 Atherosclerotic heart disease of native coronary artery without angina pectoris: Secondary | ICD-10-CM | POA: Insufficient documentation

## 2012-02-12 DIAGNOSIS — R0602 Shortness of breath: Secondary | ICD-10-CM | POA: Insufficient documentation

## 2012-02-12 DIAGNOSIS — E119 Type 2 diabetes mellitus without complications: Secondary | ICD-10-CM | POA: Insufficient documentation

## 2012-02-12 DIAGNOSIS — I10 Essential (primary) hypertension: Secondary | ICD-10-CM | POA: Insufficient documentation

## 2012-02-12 DIAGNOSIS — R079 Chest pain, unspecified: Secondary | ICD-10-CM | POA: Insufficient documentation

## 2012-02-12 DIAGNOSIS — Z8249 Family history of ischemic heart disease and other diseases of the circulatory system: Secondary | ICD-10-CM | POA: Insufficient documentation

## 2012-02-12 DIAGNOSIS — Z9861 Coronary angioplasty status: Secondary | ICD-10-CM | POA: Insufficient documentation

## 2012-02-12 DIAGNOSIS — E7800 Pure hypercholesterolemia, unspecified: Secondary | ICD-10-CM | POA: Insufficient documentation

## 2012-02-12 DIAGNOSIS — Z5189 Encounter for other specified aftercare: Secondary | ICD-10-CM | POA: Insufficient documentation

## 2012-02-14 ENCOUNTER — Encounter (HOSPITAL_COMMUNITY): Payer: Self-pay

## 2012-02-15 ENCOUNTER — Encounter (HOSPITAL_COMMUNITY): Payer: Self-pay

## 2012-02-19 ENCOUNTER — Encounter (HOSPITAL_COMMUNITY)
Admission: RE | Admit: 2012-02-19 | Discharge: 2012-02-19 | Disposition: A | Discharge status: Home / Self Care | Payer: Self-pay | Source: Ambulatory Visit | Attending: Interventional Cardiology | Admitting: Interventional Cardiology

## 2012-02-21 ENCOUNTER — Encounter (HOSPITAL_COMMUNITY)
Admission: RE | Admit: 2012-02-21 | Discharge: 2012-02-21 | Disposition: A | Discharge status: Home / Self Care | Payer: Self-pay | Source: Ambulatory Visit | Attending: Interventional Cardiology | Admitting: Interventional Cardiology

## 2012-02-22 ENCOUNTER — Encounter (HOSPITAL_COMMUNITY): Payer: Self-pay

## 2012-02-26 ENCOUNTER — Encounter (HOSPITAL_COMMUNITY): Payer: Self-pay

## 2012-02-28 ENCOUNTER — Encounter (HOSPITAL_COMMUNITY): Payer: Self-pay

## 2012-02-29 ENCOUNTER — Encounter (HOSPITAL_COMMUNITY): Payer: Self-pay

## 2012-03-04 ENCOUNTER — Encounter (HOSPITAL_COMMUNITY)
Admission: RE | Admit: 2012-03-04 | Discharge: 2012-03-04 | Disposition: A | Discharge status: Home / Self Care | Payer: Self-pay | Source: Ambulatory Visit | Attending: Interventional Cardiology | Admitting: Interventional Cardiology

## 2012-03-06 ENCOUNTER — Encounter (HOSPITAL_COMMUNITY)
Admission: RE | Admit: 2012-03-06 | Discharge: 2012-03-06 | Disposition: A | Discharge status: Home / Self Care | Payer: Self-pay | Source: Ambulatory Visit | Attending: Interventional Cardiology | Admitting: Interventional Cardiology

## 2012-03-07 ENCOUNTER — Encounter (HOSPITAL_COMMUNITY)
Admission: RE | Admit: 2012-03-07 | Discharge: 2012-03-07 | Disposition: A | Discharge status: Home / Self Care | Payer: Self-pay | Source: Ambulatory Visit | Attending: Interventional Cardiology | Admitting: Interventional Cardiology

## 2012-03-11 ENCOUNTER — Encounter (HOSPITAL_COMMUNITY): Payer: Self-pay

## 2012-03-13 ENCOUNTER — Encounter (HOSPITAL_COMMUNITY): Payer: Self-pay

## 2012-03-13 DIAGNOSIS — E7800 Pure hypercholesterolemia, unspecified: Secondary | ICD-10-CM | POA: Insufficient documentation

## 2012-03-13 DIAGNOSIS — R0602 Shortness of breath: Secondary | ICD-10-CM | POA: Insufficient documentation

## 2012-03-13 DIAGNOSIS — Z9861 Coronary angioplasty status: Secondary | ICD-10-CM | POA: Insufficient documentation

## 2012-03-13 DIAGNOSIS — Z8249 Family history of ischemic heart disease and other diseases of the circulatory system: Secondary | ICD-10-CM | POA: Insufficient documentation

## 2012-03-13 DIAGNOSIS — R079 Chest pain, unspecified: Secondary | ICD-10-CM | POA: Insufficient documentation

## 2012-03-13 DIAGNOSIS — E119 Type 2 diabetes mellitus without complications: Secondary | ICD-10-CM | POA: Insufficient documentation

## 2012-03-13 DIAGNOSIS — I2510 Atherosclerotic heart disease of native coronary artery without angina pectoris: Secondary | ICD-10-CM | POA: Insufficient documentation

## 2012-03-13 DIAGNOSIS — I10 Essential (primary) hypertension: Secondary | ICD-10-CM | POA: Insufficient documentation

## 2012-03-13 DIAGNOSIS — Z5189 Encounter for other specified aftercare: Secondary | ICD-10-CM | POA: Insufficient documentation

## 2012-03-14 ENCOUNTER — Encounter (HOSPITAL_COMMUNITY)
Admission: RE | Admit: 2012-03-14 | Discharge: 2012-03-14 | Disposition: A | Discharge status: Home / Self Care | Payer: Self-pay | Source: Ambulatory Visit | Attending: Interventional Cardiology | Admitting: Interventional Cardiology

## 2012-03-18 ENCOUNTER — Encounter (HOSPITAL_COMMUNITY): Payer: Self-pay

## 2012-03-20 ENCOUNTER — Encounter (HOSPITAL_COMMUNITY): Payer: Self-pay

## 2012-03-21 ENCOUNTER — Encounter (HOSPITAL_COMMUNITY): Payer: Self-pay

## 2012-03-25 ENCOUNTER — Encounter (HOSPITAL_COMMUNITY): Payer: Self-pay

## 2012-03-27 ENCOUNTER — Encounter (HOSPITAL_COMMUNITY): Payer: Self-pay

## 2012-03-28 ENCOUNTER — Encounter (HOSPITAL_COMMUNITY): Payer: Self-pay

## 2012-04-01 ENCOUNTER — Encounter (HOSPITAL_COMMUNITY): Payer: Self-pay

## 2012-04-03 ENCOUNTER — Encounter (HOSPITAL_COMMUNITY)
Admission: RE | Admit: 2012-04-03 | Discharge: 2012-04-03 | Disposition: A | Discharge status: Home / Self Care | Payer: Self-pay | Source: Ambulatory Visit | Attending: Interventional Cardiology | Admitting: Interventional Cardiology

## 2012-04-04 ENCOUNTER — Encounter (HOSPITAL_COMMUNITY): Payer: Self-pay

## 2012-04-08 ENCOUNTER — Encounter (HOSPITAL_COMMUNITY): Payer: Self-pay

## 2012-04-10 ENCOUNTER — Encounter (HOSPITAL_COMMUNITY)
Admission: RE | Admit: 2012-04-10 | Discharge: 2012-04-10 | Disposition: A | Discharge status: Home / Self Care | Payer: Self-pay | Source: Ambulatory Visit | Attending: Interventional Cardiology | Admitting: Interventional Cardiology

## 2012-04-11 ENCOUNTER — Encounter (HOSPITAL_COMMUNITY)
Admission: RE | Admit: 2012-04-11 | Discharge: 2012-04-11 | Disposition: A | Discharge status: Home / Self Care | Payer: Self-pay | Source: Ambulatory Visit | Attending: Interventional Cardiology | Admitting: Interventional Cardiology

## 2012-04-15 ENCOUNTER — Encounter (HOSPITAL_COMMUNITY): Payer: Self-pay

## 2012-04-15 DIAGNOSIS — Z5189 Encounter for other specified aftercare: Secondary | ICD-10-CM | POA: Insufficient documentation

## 2012-04-15 DIAGNOSIS — E119 Type 2 diabetes mellitus without complications: Secondary | ICD-10-CM | POA: Insufficient documentation

## 2012-04-15 DIAGNOSIS — I10 Essential (primary) hypertension: Secondary | ICD-10-CM | POA: Insufficient documentation

## 2012-04-15 DIAGNOSIS — E7800 Pure hypercholesterolemia, unspecified: Secondary | ICD-10-CM | POA: Insufficient documentation

## 2012-04-15 DIAGNOSIS — I2510 Atherosclerotic heart disease of native coronary artery without angina pectoris: Secondary | ICD-10-CM | POA: Insufficient documentation

## 2012-04-15 DIAGNOSIS — R0602 Shortness of breath: Secondary | ICD-10-CM | POA: Insufficient documentation

## 2012-04-15 DIAGNOSIS — Z9861 Coronary angioplasty status: Secondary | ICD-10-CM | POA: Insufficient documentation

## 2012-04-15 DIAGNOSIS — R079 Chest pain, unspecified: Secondary | ICD-10-CM | POA: Insufficient documentation

## 2012-04-15 DIAGNOSIS — Z8249 Family history of ischemic heart disease and other diseases of the circulatory system: Secondary | ICD-10-CM | POA: Insufficient documentation

## 2012-04-17 ENCOUNTER — Encounter (HOSPITAL_COMMUNITY)
Admission: RE | Admit: 2012-04-17 | Discharge: 2012-04-17 | Disposition: A | Discharge status: Home / Self Care | Payer: Self-pay | Source: Ambulatory Visit | Attending: Interventional Cardiology | Admitting: Interventional Cardiology

## 2012-04-18 ENCOUNTER — Encounter (HOSPITAL_COMMUNITY)
Admission: RE | Admit: 2012-04-18 | Discharge: 2012-04-18 | Disposition: A | Discharge status: Home / Self Care | Payer: Self-pay | Source: Ambulatory Visit | Attending: Interventional Cardiology | Admitting: Interventional Cardiology

## 2012-04-22 ENCOUNTER — Encounter (HOSPITAL_COMMUNITY)
Admission: RE | Admit: 2012-04-22 | Discharge: 2012-04-22 | Disposition: A | Discharge status: Home / Self Care | Payer: Self-pay | Source: Ambulatory Visit | Attending: Interventional Cardiology | Admitting: Interventional Cardiology

## 2012-04-24 ENCOUNTER — Encounter (HOSPITAL_COMMUNITY)
Admission: RE | Admit: 2012-04-24 | Discharge: 2012-04-24 | Disposition: A | Discharge status: Home / Self Care | Payer: Self-pay | Source: Ambulatory Visit | Attending: Interventional Cardiology | Admitting: Interventional Cardiology

## 2012-04-25 ENCOUNTER — Encounter (HOSPITAL_COMMUNITY)
Admission: RE | Admit: 2012-04-25 | Discharge: 2012-04-25 | Disposition: A | Discharge status: Home / Self Care | Payer: Self-pay | Source: Ambulatory Visit | Attending: Interventional Cardiology | Admitting: Interventional Cardiology

## 2012-04-29 ENCOUNTER — Encounter (HOSPITAL_COMMUNITY)
Admission: RE | Admit: 2012-04-29 | Discharge: 2012-04-29 | Disposition: A | Discharge status: Home / Self Care | Payer: Self-pay | Source: Ambulatory Visit | Attending: Interventional Cardiology | Admitting: Interventional Cardiology

## 2012-05-01 ENCOUNTER — Encounter (HOSPITAL_COMMUNITY)
Admission: RE | Admit: 2012-05-01 | Discharge: 2012-05-01 | Disposition: A | Discharge status: Home / Self Care | Payer: Self-pay | Source: Ambulatory Visit | Attending: Interventional Cardiology | Admitting: Interventional Cardiology

## 2012-05-02 ENCOUNTER — Encounter (HOSPITAL_COMMUNITY): Payer: Self-pay

## 2012-05-06 ENCOUNTER — Encounter (HOSPITAL_COMMUNITY)
Admission: RE | Admit: 2012-05-06 | Discharge: 2012-05-06 | Disposition: A | Discharge status: Home / Self Care | Payer: Self-pay | Source: Ambulatory Visit | Attending: Interventional Cardiology | Admitting: Interventional Cardiology

## 2012-05-08 ENCOUNTER — Encounter (HOSPITAL_COMMUNITY): Payer: Self-pay

## 2012-05-09 ENCOUNTER — Encounter (HOSPITAL_COMMUNITY)
Admission: RE | Admit: 2012-05-09 | Discharge: 2012-05-09 | Disposition: A | Discharge status: Home / Self Care | Payer: Self-pay | Source: Ambulatory Visit | Attending: Interventional Cardiology | Admitting: Interventional Cardiology

## 2012-05-13 ENCOUNTER — Encounter (HOSPITAL_COMMUNITY)
Admission: RE | Admit: 2012-05-13 | Discharge: 2012-05-13 | Disposition: A | Discharge status: Home / Self Care | Payer: Self-pay | Source: Ambulatory Visit | Attending: Interventional Cardiology | Admitting: Interventional Cardiology

## 2012-05-13 DIAGNOSIS — Z9861 Coronary angioplasty status: Secondary | ICD-10-CM | POA: Insufficient documentation

## 2012-05-13 DIAGNOSIS — Z5189 Encounter for other specified aftercare: Secondary | ICD-10-CM | POA: Insufficient documentation

## 2012-05-13 DIAGNOSIS — R0602 Shortness of breath: Secondary | ICD-10-CM | POA: Insufficient documentation

## 2012-05-13 DIAGNOSIS — E119 Type 2 diabetes mellitus without complications: Secondary | ICD-10-CM | POA: Insufficient documentation

## 2012-05-13 DIAGNOSIS — I2510 Atherosclerotic heart disease of native coronary artery without angina pectoris: Secondary | ICD-10-CM | POA: Insufficient documentation

## 2012-05-13 DIAGNOSIS — R079 Chest pain, unspecified: Secondary | ICD-10-CM | POA: Insufficient documentation

## 2012-05-13 DIAGNOSIS — Z8249 Family history of ischemic heart disease and other diseases of the circulatory system: Secondary | ICD-10-CM | POA: Insufficient documentation

## 2012-05-13 DIAGNOSIS — E7800 Pure hypercholesterolemia, unspecified: Secondary | ICD-10-CM | POA: Insufficient documentation

## 2012-05-13 DIAGNOSIS — I10 Essential (primary) hypertension: Secondary | ICD-10-CM | POA: Insufficient documentation

## 2012-05-15 ENCOUNTER — Encounter (HOSPITAL_COMMUNITY)
Admission: RE | Admit: 2012-05-15 | Discharge: 2012-05-15 | Disposition: A | Discharge status: Home / Self Care | Payer: Self-pay | Source: Ambulatory Visit | Attending: Interventional Cardiology | Admitting: Interventional Cardiology

## 2012-05-16 ENCOUNTER — Encounter (HOSPITAL_COMMUNITY): Payer: Self-pay

## 2012-05-20 ENCOUNTER — Encounter (HOSPITAL_COMMUNITY): Payer: Self-pay

## 2012-05-22 ENCOUNTER — Encounter (HOSPITAL_COMMUNITY)
Admission: RE | Admit: 2012-05-22 | Discharge: 2012-05-22 | Disposition: A | Discharge status: Home / Self Care | Payer: Self-pay | Source: Ambulatory Visit | Attending: Interventional Cardiology | Admitting: Interventional Cardiology

## 2012-05-23 ENCOUNTER — Encounter (HOSPITAL_COMMUNITY)
Admission: RE | Admit: 2012-05-23 | Discharge: 2012-05-23 | Disposition: A | Discharge status: Home / Self Care | Payer: Self-pay | Source: Ambulatory Visit | Attending: Interventional Cardiology | Admitting: Interventional Cardiology

## 2012-05-27 ENCOUNTER — Encounter (HOSPITAL_COMMUNITY)
Admission: RE | Admit: 2012-05-27 | Discharge: 2012-05-27 | Disposition: A | Discharge status: Home / Self Care | Payer: Self-pay | Source: Ambulatory Visit | Attending: Interventional Cardiology | Admitting: Interventional Cardiology

## 2012-05-29 ENCOUNTER — Encounter (HOSPITAL_COMMUNITY)
Admission: RE | Admit: 2012-05-29 | Discharge: 2012-05-29 | Disposition: A | Discharge status: Home / Self Care | Payer: Self-pay | Source: Ambulatory Visit | Attending: Interventional Cardiology | Admitting: Interventional Cardiology

## 2012-05-30 ENCOUNTER — Encounter (HOSPITAL_COMMUNITY)
Admission: RE | Admit: 2012-05-30 | Discharge: 2012-05-30 | Disposition: A | Discharge status: Home / Self Care | Payer: Self-pay | Source: Ambulatory Visit | Attending: Interventional Cardiology | Admitting: Interventional Cardiology

## 2012-06-03 ENCOUNTER — Encounter (HOSPITAL_COMMUNITY)
Admission: RE | Admit: 2012-06-03 | Discharge: 2012-06-03 | Disposition: A | Discharge status: Home / Self Care | Payer: Self-pay | Source: Ambulatory Visit | Attending: Interventional Cardiology | Admitting: Interventional Cardiology

## 2012-06-05 ENCOUNTER — Encounter (HOSPITAL_COMMUNITY): Payer: Self-pay

## 2012-06-06 ENCOUNTER — Encounter (HOSPITAL_COMMUNITY)
Admission: RE | Admit: 2012-06-06 | Discharge: 2012-06-06 | Disposition: A | Discharge status: Home / Self Care | Payer: Self-pay | Source: Ambulatory Visit | Attending: Interventional Cardiology | Admitting: Interventional Cardiology

## 2012-06-10 ENCOUNTER — Encounter (HOSPITAL_COMMUNITY)
Admission: RE | Admit: 2012-06-10 | Discharge: 2012-06-10 | Disposition: A | Discharge status: Home / Self Care | Payer: Self-pay | Source: Ambulatory Visit | Attending: Interventional Cardiology | Admitting: Interventional Cardiology

## 2012-06-12 ENCOUNTER — Encounter (HOSPITAL_COMMUNITY)
Admission: RE | Admit: 2012-06-12 | Discharge: 2012-06-12 | Disposition: A | Discharge status: Home / Self Care | Payer: Self-pay | Source: Ambulatory Visit | Attending: Interventional Cardiology | Admitting: Interventional Cardiology

## 2012-06-13 ENCOUNTER — Encounter (HOSPITAL_COMMUNITY): Payer: Self-pay

## 2012-06-13 DIAGNOSIS — R0602 Shortness of breath: Secondary | ICD-10-CM | POA: Insufficient documentation

## 2012-06-13 DIAGNOSIS — E119 Type 2 diabetes mellitus without complications: Secondary | ICD-10-CM | POA: Insufficient documentation

## 2012-06-13 DIAGNOSIS — Z5189 Encounter for other specified aftercare: Secondary | ICD-10-CM | POA: Insufficient documentation

## 2012-06-13 DIAGNOSIS — I10 Essential (primary) hypertension: Secondary | ICD-10-CM | POA: Insufficient documentation

## 2012-06-13 DIAGNOSIS — I2510 Atherosclerotic heart disease of native coronary artery without angina pectoris: Secondary | ICD-10-CM | POA: Insufficient documentation

## 2012-06-13 DIAGNOSIS — E7800 Pure hypercholesterolemia, unspecified: Secondary | ICD-10-CM | POA: Insufficient documentation

## 2012-06-13 DIAGNOSIS — Z8249 Family history of ischemic heart disease and other diseases of the circulatory system: Secondary | ICD-10-CM | POA: Insufficient documentation

## 2012-06-13 DIAGNOSIS — R079 Chest pain, unspecified: Secondary | ICD-10-CM | POA: Insufficient documentation

## 2012-06-13 DIAGNOSIS — Z9861 Coronary angioplasty status: Secondary | ICD-10-CM | POA: Insufficient documentation

## 2012-06-17 ENCOUNTER — Encounter (HOSPITAL_COMMUNITY)
Admission: RE | Admit: 2012-06-17 | Discharge: 2012-06-17 | Disposition: A | Discharge status: Home / Self Care | Payer: Self-pay | Source: Ambulatory Visit | Attending: Interventional Cardiology | Admitting: Interventional Cardiology

## 2012-06-19 ENCOUNTER — Encounter (HOSPITAL_COMMUNITY)
Admission: RE | Admit: 2012-06-19 | Discharge: 2012-06-19 | Disposition: A | Discharge status: Home / Self Care | Payer: Self-pay | Source: Ambulatory Visit | Attending: Interventional Cardiology | Admitting: Interventional Cardiology

## 2012-06-20 ENCOUNTER — Encounter (HOSPITAL_COMMUNITY)
Admission: RE | Admit: 2012-06-20 | Discharge: 2012-06-20 | Disposition: A | Discharge status: Home / Self Care | Payer: Self-pay | Source: Ambulatory Visit | Attending: Interventional Cardiology | Admitting: Interventional Cardiology

## 2012-06-24 ENCOUNTER — Encounter (HOSPITAL_COMMUNITY): Payer: Self-pay

## 2012-06-26 ENCOUNTER — Encounter (HOSPITAL_COMMUNITY)
Admission: RE | Admit: 2012-06-26 | Discharge: 2012-06-26 | Disposition: A | Discharge status: Home / Self Care | Payer: Self-pay | Source: Ambulatory Visit | Attending: Interventional Cardiology | Admitting: Interventional Cardiology

## 2012-06-27 ENCOUNTER — Encounter (HOSPITAL_COMMUNITY): Payer: Self-pay

## 2012-07-01 ENCOUNTER — Encounter (HOSPITAL_COMMUNITY): Payer: Self-pay

## 2012-07-03 ENCOUNTER — Encounter (HOSPITAL_COMMUNITY): Payer: Self-pay

## 2012-07-04 ENCOUNTER — Encounter (HOSPITAL_COMMUNITY): Payer: Self-pay

## 2012-07-08 ENCOUNTER — Encounter (HOSPITAL_COMMUNITY): Payer: Self-pay

## 2012-07-10 ENCOUNTER — Encounter (HOSPITAL_COMMUNITY): Payer: Self-pay

## 2012-07-11 ENCOUNTER — Encounter (HOSPITAL_COMMUNITY): Payer: Self-pay

## 2012-07-15 ENCOUNTER — Encounter (HOSPITAL_COMMUNITY)
Admission: RE | Admit: 2012-07-15 | Discharge: 2012-07-15 | Disposition: A | Discharge status: Home / Self Care | Payer: Self-pay | Source: Ambulatory Visit | Attending: Interventional Cardiology | Admitting: Interventional Cardiology

## 2012-07-15 DIAGNOSIS — R079 Chest pain, unspecified: Secondary | ICD-10-CM | POA: Insufficient documentation

## 2012-07-15 DIAGNOSIS — Z8249 Family history of ischemic heart disease and other diseases of the circulatory system: Secondary | ICD-10-CM | POA: Insufficient documentation

## 2012-07-15 DIAGNOSIS — Z5189 Encounter for other specified aftercare: Secondary | ICD-10-CM | POA: Insufficient documentation

## 2012-07-15 DIAGNOSIS — E119 Type 2 diabetes mellitus without complications: Secondary | ICD-10-CM | POA: Insufficient documentation

## 2012-07-15 DIAGNOSIS — I10 Essential (primary) hypertension: Secondary | ICD-10-CM | POA: Insufficient documentation

## 2012-07-15 DIAGNOSIS — E7800 Pure hypercholesterolemia, unspecified: Secondary | ICD-10-CM | POA: Insufficient documentation

## 2012-07-15 DIAGNOSIS — I2510 Atherosclerotic heart disease of native coronary artery without angina pectoris: Secondary | ICD-10-CM | POA: Insufficient documentation

## 2012-07-15 DIAGNOSIS — Z9861 Coronary angioplasty status: Secondary | ICD-10-CM | POA: Insufficient documentation

## 2012-07-15 DIAGNOSIS — R0602 Shortness of breath: Secondary | ICD-10-CM | POA: Insufficient documentation

## 2012-07-17 ENCOUNTER — Encounter (HOSPITAL_COMMUNITY): Payer: Self-pay

## 2012-07-18 ENCOUNTER — Encounter (HOSPITAL_COMMUNITY): Payer: Self-pay

## 2012-07-22 ENCOUNTER — Encounter (HOSPITAL_COMMUNITY)
Admission: RE | Admit: 2012-07-22 | Discharge: 2012-07-22 | Disposition: A | Discharge status: Home / Self Care | Payer: Self-pay | Source: Ambulatory Visit | Attending: Interventional Cardiology | Admitting: Interventional Cardiology

## 2012-07-24 ENCOUNTER — Encounter (HOSPITAL_COMMUNITY)
Admission: RE | Admit: 2012-07-24 | Discharge: 2012-07-24 | Disposition: A | Discharge status: Home / Self Care | Payer: Self-pay | Source: Ambulatory Visit | Attending: Interventional Cardiology | Admitting: Interventional Cardiology

## 2012-07-25 ENCOUNTER — Encounter (HOSPITAL_COMMUNITY)
Admission: RE | Admit: 2012-07-25 | Discharge: 2012-07-25 | Disposition: A | Discharge status: Home / Self Care | Payer: Self-pay | Source: Ambulatory Visit | Attending: Interventional Cardiology | Admitting: Interventional Cardiology

## 2012-07-29 ENCOUNTER — Encounter (HOSPITAL_COMMUNITY)
Admission: RE | Admit: 2012-07-29 | Discharge: 2012-07-29 | Disposition: A | Discharge status: Home / Self Care | Payer: Self-pay | Source: Ambulatory Visit | Attending: Interventional Cardiology | Admitting: Interventional Cardiology

## 2012-07-31 ENCOUNTER — Encounter (HOSPITAL_COMMUNITY): Payer: Self-pay

## 2012-08-01 ENCOUNTER — Encounter (HOSPITAL_COMMUNITY)
Admission: RE | Admit: 2012-08-01 | Discharge: 2012-08-01 | Disposition: A | Discharge status: Home / Self Care | Payer: Self-pay | Source: Ambulatory Visit | Attending: Interventional Cardiology | Admitting: Interventional Cardiology

## 2012-08-05 ENCOUNTER — Encounter (HOSPITAL_COMMUNITY): Payer: Self-pay

## 2012-08-07 ENCOUNTER — Encounter (HOSPITAL_COMMUNITY): Payer: Self-pay

## 2012-08-08 ENCOUNTER — Encounter (HOSPITAL_COMMUNITY): Payer: Self-pay

## 2012-08-12 ENCOUNTER — Encounter (HOSPITAL_COMMUNITY): Payer: Self-pay

## 2012-08-14 ENCOUNTER — Encounter (HOSPITAL_COMMUNITY): Payer: Self-pay

## 2012-08-14 DIAGNOSIS — I2510 Atherosclerotic heart disease of native coronary artery without angina pectoris: Secondary | ICD-10-CM | POA: Insufficient documentation

## 2012-08-14 DIAGNOSIS — E119 Type 2 diabetes mellitus without complications: Secondary | ICD-10-CM | POA: Insufficient documentation

## 2012-08-14 DIAGNOSIS — R0602 Shortness of breath: Secondary | ICD-10-CM | POA: Insufficient documentation

## 2012-08-14 DIAGNOSIS — Z8249 Family history of ischemic heart disease and other diseases of the circulatory system: Secondary | ICD-10-CM | POA: Insufficient documentation

## 2012-08-14 DIAGNOSIS — E7800 Pure hypercholesterolemia, unspecified: Secondary | ICD-10-CM | POA: Insufficient documentation

## 2012-08-14 DIAGNOSIS — Z5189 Encounter for other specified aftercare: Secondary | ICD-10-CM | POA: Insufficient documentation

## 2012-08-14 DIAGNOSIS — I10 Essential (primary) hypertension: Secondary | ICD-10-CM | POA: Insufficient documentation

## 2012-08-14 DIAGNOSIS — Z9861 Coronary angioplasty status: Secondary | ICD-10-CM | POA: Insufficient documentation

## 2012-08-14 DIAGNOSIS — R079 Chest pain, unspecified: Secondary | ICD-10-CM | POA: Insufficient documentation

## 2012-08-15 ENCOUNTER — Encounter (HOSPITAL_COMMUNITY): Payer: Self-pay

## 2012-08-19 ENCOUNTER — Encounter (HOSPITAL_COMMUNITY): Payer: Self-pay

## 2012-08-21 ENCOUNTER — Encounter (HOSPITAL_COMMUNITY)
Admission: RE | Admit: 2012-08-21 | Discharge: 2012-08-21 | Disposition: A | Discharge status: Home / Self Care | Payer: Self-pay | Source: Ambulatory Visit | Attending: Interventional Cardiology | Admitting: Interventional Cardiology

## 2012-08-22 ENCOUNTER — Encounter (HOSPITAL_COMMUNITY): Payer: Self-pay

## 2012-08-26 ENCOUNTER — Encounter (HOSPITAL_COMMUNITY)
Admission: RE | Admit: 2012-08-26 | Discharge: 2012-08-26 | Disposition: A | Discharge status: Home / Self Care | Payer: Self-pay | Source: Ambulatory Visit | Attending: Interventional Cardiology | Admitting: Interventional Cardiology

## 2012-08-28 ENCOUNTER — Encounter (HOSPITAL_COMMUNITY)
Admission: RE | Admit: 2012-08-28 | Discharge: 2012-08-28 | Disposition: A | Discharge status: Home / Self Care | Payer: Self-pay | Source: Ambulatory Visit | Attending: Interventional Cardiology | Admitting: Interventional Cardiology

## 2012-08-29 ENCOUNTER — Encounter (HOSPITAL_COMMUNITY)
Admission: RE | Admit: 2012-08-29 | Discharge: 2012-08-29 | Disposition: A | Discharge status: Home / Self Care | Payer: Self-pay | Source: Ambulatory Visit | Attending: Interventional Cardiology | Admitting: Interventional Cardiology

## 2012-09-02 ENCOUNTER — Encounter (HOSPITAL_COMMUNITY)
Admission: RE | Admit: 2012-09-02 | Discharge: 2012-09-02 | Disposition: A | Discharge status: Home / Self Care | Payer: Self-pay | Source: Ambulatory Visit | Attending: Interventional Cardiology | Admitting: Interventional Cardiology

## 2012-09-04 ENCOUNTER — Encounter (HOSPITAL_COMMUNITY): Payer: Self-pay

## 2012-09-05 ENCOUNTER — Encounter (HOSPITAL_COMMUNITY): Payer: Self-pay

## 2012-09-09 ENCOUNTER — Encounter (HOSPITAL_COMMUNITY)
Admission: RE | Admit: 2012-09-09 | Discharge: 2012-09-09 | Disposition: A | Discharge status: Home / Self Care | Payer: Self-pay | Source: Ambulatory Visit | Attending: Interventional Cardiology | Admitting: Interventional Cardiology

## 2012-09-11 ENCOUNTER — Encounter (HOSPITAL_COMMUNITY): Payer: Self-pay

## 2012-09-12 ENCOUNTER — Encounter (HOSPITAL_COMMUNITY): Payer: Self-pay

## 2012-09-16 ENCOUNTER — Encounter (HOSPITAL_COMMUNITY)
Admission: RE | Admit: 2012-09-16 | Discharge: 2012-09-16 | Disposition: A | Discharge status: Home / Self Care | Payer: Self-pay | Source: Ambulatory Visit | Attending: Interventional Cardiology | Admitting: Interventional Cardiology

## 2012-09-16 DIAGNOSIS — Z8249 Family history of ischemic heart disease and other diseases of the circulatory system: Secondary | ICD-10-CM | POA: Insufficient documentation

## 2012-09-16 DIAGNOSIS — Z5189 Encounter for other specified aftercare: Secondary | ICD-10-CM | POA: Insufficient documentation

## 2012-09-16 DIAGNOSIS — E119 Type 2 diabetes mellitus without complications: Secondary | ICD-10-CM | POA: Insufficient documentation

## 2012-09-16 DIAGNOSIS — R079 Chest pain, unspecified: Secondary | ICD-10-CM | POA: Insufficient documentation

## 2012-09-16 DIAGNOSIS — I10 Essential (primary) hypertension: Secondary | ICD-10-CM | POA: Insufficient documentation

## 2012-09-16 DIAGNOSIS — E7800 Pure hypercholesterolemia, unspecified: Secondary | ICD-10-CM | POA: Insufficient documentation

## 2012-09-16 DIAGNOSIS — Z9861 Coronary angioplasty status: Secondary | ICD-10-CM | POA: Insufficient documentation

## 2012-09-16 DIAGNOSIS — I2510 Atherosclerotic heart disease of native coronary artery without angina pectoris: Secondary | ICD-10-CM | POA: Insufficient documentation

## 2012-09-16 DIAGNOSIS — R0602 Shortness of breath: Secondary | ICD-10-CM | POA: Insufficient documentation

## 2012-09-18 ENCOUNTER — Encounter (HOSPITAL_COMMUNITY)
Admission: RE | Admit: 2012-09-18 | Discharge: 2012-09-18 | Disposition: A | Discharge status: Home / Self Care | Payer: Self-pay | Source: Ambulatory Visit | Attending: Interventional Cardiology | Admitting: Interventional Cardiology

## 2012-09-19 ENCOUNTER — Encounter (HOSPITAL_COMMUNITY)
Admission: RE | Admit: 2012-09-19 | Discharge: 2012-09-19 | Disposition: A | Discharge status: Home / Self Care | Payer: Self-pay | Source: Ambulatory Visit | Attending: Interventional Cardiology | Admitting: Interventional Cardiology

## 2012-09-23 ENCOUNTER — Encounter (HOSPITAL_COMMUNITY)
Admission: RE | Admit: 2012-09-23 | Discharge: 2012-09-23 | Disposition: A | Discharge status: Home / Self Care | Payer: Self-pay | Source: Ambulatory Visit | Attending: Interventional Cardiology | Admitting: Interventional Cardiology

## 2012-09-25 ENCOUNTER — Encounter (HOSPITAL_COMMUNITY): Payer: Self-pay

## 2012-09-26 ENCOUNTER — Encounter (HOSPITAL_COMMUNITY): Payer: Self-pay

## 2012-09-30 ENCOUNTER — Encounter (HOSPITAL_COMMUNITY)
Admission: RE | Admit: 2012-09-30 | Discharge: 2012-09-30 | Disposition: A | Discharge status: Home / Self Care | Payer: Self-pay | Source: Ambulatory Visit | Attending: Interventional Cardiology | Admitting: Interventional Cardiology

## 2012-10-02 ENCOUNTER — Encounter (HOSPITAL_COMMUNITY)
Admission: RE | Admit: 2012-10-02 | Discharge: 2012-10-02 | Disposition: A | Discharge status: Home / Self Care | Payer: Self-pay | Source: Ambulatory Visit | Attending: Interventional Cardiology | Admitting: Interventional Cardiology

## 2012-10-03 ENCOUNTER — Encounter (HOSPITAL_COMMUNITY)
Admission: RE | Admit: 2012-10-03 | Discharge: 2012-10-03 | Disposition: A | Discharge status: Home / Self Care | Payer: Self-pay | Source: Ambulatory Visit | Attending: Interventional Cardiology | Admitting: Interventional Cardiology

## 2012-10-07 ENCOUNTER — Encounter (HOSPITAL_COMMUNITY)
Admission: RE | Admit: 2012-10-07 | Discharge: 2012-10-07 | Disposition: A | Discharge status: Home / Self Care | Payer: Self-pay | Source: Ambulatory Visit | Attending: Interventional Cardiology | Admitting: Interventional Cardiology

## 2012-10-09 ENCOUNTER — Encounter (HOSPITAL_COMMUNITY)
Admission: RE | Admit: 2012-10-09 | Discharge: 2012-10-09 | Disposition: A | Discharge status: Home / Self Care | Payer: Self-pay | Source: Ambulatory Visit | Attending: Interventional Cardiology | Admitting: Interventional Cardiology

## 2012-10-10 ENCOUNTER — Encounter (HOSPITAL_COMMUNITY)
Admission: RE | Admit: 2012-10-10 | Discharge: 2012-10-10 | Disposition: A | Discharge status: Home / Self Care | Payer: Self-pay | Source: Ambulatory Visit | Attending: Interventional Cardiology | Admitting: Interventional Cardiology

## 2012-10-14 ENCOUNTER — Encounter (HOSPITAL_COMMUNITY): Payer: Self-pay

## 2012-10-14 DIAGNOSIS — R0602 Shortness of breath: Secondary | ICD-10-CM | POA: Insufficient documentation

## 2012-10-14 DIAGNOSIS — I2510 Atherosclerotic heart disease of native coronary artery without angina pectoris: Secondary | ICD-10-CM | POA: Insufficient documentation

## 2012-10-14 DIAGNOSIS — Z9861 Coronary angioplasty status: Secondary | ICD-10-CM | POA: Insufficient documentation

## 2012-10-14 DIAGNOSIS — Z8249 Family history of ischemic heart disease and other diseases of the circulatory system: Secondary | ICD-10-CM | POA: Insufficient documentation

## 2012-10-14 DIAGNOSIS — Z5189 Encounter for other specified aftercare: Secondary | ICD-10-CM | POA: Insufficient documentation

## 2012-10-14 DIAGNOSIS — E7800 Pure hypercholesterolemia, unspecified: Secondary | ICD-10-CM | POA: Insufficient documentation

## 2012-10-14 DIAGNOSIS — E119 Type 2 diabetes mellitus without complications: Secondary | ICD-10-CM | POA: Insufficient documentation

## 2012-10-14 DIAGNOSIS — I10 Essential (primary) hypertension: Secondary | ICD-10-CM | POA: Insufficient documentation

## 2012-10-14 DIAGNOSIS — R079 Chest pain, unspecified: Secondary | ICD-10-CM | POA: Insufficient documentation

## 2012-10-16 ENCOUNTER — Encounter (HOSPITAL_COMMUNITY)
Admission: RE | Admit: 2012-10-16 | Discharge: 2012-10-16 | Disposition: A | Discharge status: Home / Self Care | Payer: Self-pay | Source: Ambulatory Visit | Attending: Interventional Cardiology | Admitting: Interventional Cardiology

## 2012-10-17 ENCOUNTER — Encounter (HOSPITAL_COMMUNITY): Payer: Self-pay

## 2012-10-21 ENCOUNTER — Encounter (HOSPITAL_COMMUNITY): Payer: Self-pay

## 2012-10-23 ENCOUNTER — Encounter (HOSPITAL_COMMUNITY): Payer: Self-pay

## 2012-10-24 ENCOUNTER — Encounter (HOSPITAL_COMMUNITY): Payer: Self-pay

## 2012-10-28 ENCOUNTER — Encounter (HOSPITAL_COMMUNITY): Payer: Self-pay

## 2012-10-30 ENCOUNTER — Encounter (HOSPITAL_COMMUNITY): Payer: Self-pay

## 2012-10-31 ENCOUNTER — Encounter (HOSPITAL_COMMUNITY): Payer: Self-pay

## 2012-11-04 ENCOUNTER — Encounter (HOSPITAL_COMMUNITY): Payer: Self-pay

## 2012-11-06 ENCOUNTER — Encounter (HOSPITAL_COMMUNITY): Payer: Self-pay

## 2012-11-07 ENCOUNTER — Encounter (HOSPITAL_COMMUNITY)
Admission: RE | Admit: 2012-11-07 | Discharge: 2012-11-07 | Disposition: A | Discharge status: Home / Self Care | Payer: Self-pay | Source: Ambulatory Visit | Attending: Interventional Cardiology | Admitting: Interventional Cardiology

## 2012-11-11 ENCOUNTER — Encounter (HOSPITAL_COMMUNITY)
Admission: RE | Admit: 2012-11-11 | Discharge: 2012-11-11 | Disposition: A | Discharge status: Home / Self Care | Payer: Self-pay | Source: Ambulatory Visit | Attending: Interventional Cardiology | Admitting: Interventional Cardiology

## 2012-11-11 DIAGNOSIS — Z5189 Encounter for other specified aftercare: Secondary | ICD-10-CM | POA: Insufficient documentation

## 2012-11-11 DIAGNOSIS — E119 Type 2 diabetes mellitus without complications: Secondary | ICD-10-CM | POA: Insufficient documentation

## 2012-11-11 DIAGNOSIS — Z9861 Coronary angioplasty status: Secondary | ICD-10-CM | POA: Insufficient documentation

## 2012-11-11 DIAGNOSIS — R079 Chest pain, unspecified: Secondary | ICD-10-CM | POA: Insufficient documentation

## 2012-11-11 DIAGNOSIS — R0602 Shortness of breath: Secondary | ICD-10-CM | POA: Insufficient documentation

## 2012-11-11 DIAGNOSIS — E7800 Pure hypercholesterolemia, unspecified: Secondary | ICD-10-CM | POA: Insufficient documentation

## 2012-11-11 DIAGNOSIS — I10 Essential (primary) hypertension: Secondary | ICD-10-CM | POA: Insufficient documentation

## 2012-11-11 DIAGNOSIS — Z8249 Family history of ischemic heart disease and other diseases of the circulatory system: Secondary | ICD-10-CM | POA: Insufficient documentation

## 2012-11-11 DIAGNOSIS — I2510 Atherosclerotic heart disease of native coronary artery without angina pectoris: Secondary | ICD-10-CM | POA: Insufficient documentation

## 2012-11-13 ENCOUNTER — Encounter (HOSPITAL_COMMUNITY)
Admission: RE | Admit: 2012-11-13 | Discharge: 2012-11-13 | Disposition: A | Discharge status: Home / Self Care | Payer: Self-pay | Source: Ambulatory Visit | Attending: Interventional Cardiology | Admitting: Interventional Cardiology

## 2012-11-14 ENCOUNTER — Encounter (HOSPITAL_COMMUNITY)
Admission: RE | Admit: 2012-11-14 | Discharge: 2012-11-14 | Disposition: A | Discharge status: Home / Self Care | Payer: Self-pay | Source: Ambulatory Visit | Attending: Interventional Cardiology | Admitting: Interventional Cardiology

## 2012-11-18 ENCOUNTER — Encounter (HOSPITAL_COMMUNITY)
Admission: RE | Admit: 2012-11-18 | Discharge: 2012-11-18 | Disposition: A | Discharge status: Home / Self Care | Payer: Self-pay | Source: Ambulatory Visit | Attending: Interventional Cardiology | Admitting: Interventional Cardiology

## 2012-11-20 ENCOUNTER — Encounter (HOSPITAL_COMMUNITY): Payer: Self-pay

## 2012-11-21 ENCOUNTER — Encounter (HOSPITAL_COMMUNITY)
Admission: RE | Admit: 2012-11-21 | Discharge: 2012-11-21 | Disposition: A | Discharge status: Home / Self Care | Payer: Self-pay | Source: Ambulatory Visit | Attending: Interventional Cardiology | Admitting: Interventional Cardiology

## 2012-11-25 ENCOUNTER — Encounter (HOSPITAL_COMMUNITY)
Admission: RE | Admit: 2012-11-25 | Discharge: 2012-11-25 | Disposition: A | Discharge status: Home / Self Care | Payer: Self-pay | Source: Ambulatory Visit | Attending: Interventional Cardiology | Admitting: Interventional Cardiology

## 2012-11-27 ENCOUNTER — Encounter (HOSPITAL_COMMUNITY): Payer: Self-pay

## 2012-11-28 ENCOUNTER — Encounter (HOSPITAL_COMMUNITY): Payer: Self-pay

## 2012-12-02 ENCOUNTER — Encounter (HOSPITAL_COMMUNITY)
Admission: RE | Admit: 2012-12-02 | Discharge: 2012-12-02 | Disposition: A | Discharge status: Home / Self Care | Payer: Self-pay | Source: Ambulatory Visit | Attending: Interventional Cardiology | Admitting: Interventional Cardiology

## 2012-12-04 ENCOUNTER — Encounter (HOSPITAL_COMMUNITY)
Admission: RE | Admit: 2012-12-04 | Discharge: 2012-12-04 | Disposition: A | Discharge status: Home / Self Care | Payer: Self-pay | Source: Ambulatory Visit | Attending: Interventional Cardiology | Admitting: Interventional Cardiology

## 2012-12-05 ENCOUNTER — Encounter (HOSPITAL_COMMUNITY)
Admission: RE | Admit: 2012-12-05 | Discharge: 2012-12-05 | Disposition: A | Discharge status: Home / Self Care | Payer: Self-pay | Source: Ambulatory Visit | Attending: Interventional Cardiology | Admitting: Interventional Cardiology

## 2012-12-09 ENCOUNTER — Encounter (HOSPITAL_COMMUNITY): Payer: Self-pay

## 2012-12-11 ENCOUNTER — Encounter (HOSPITAL_COMMUNITY)
Admission: RE | Admit: 2012-12-11 | Discharge: 2012-12-11 | Disposition: A | Discharge status: Home / Self Care | Payer: Self-pay | Source: Ambulatory Visit | Attending: Interventional Cardiology | Admitting: Interventional Cardiology

## 2012-12-11 DIAGNOSIS — I2510 Atherosclerotic heart disease of native coronary artery without angina pectoris: Secondary | ICD-10-CM | POA: Insufficient documentation

## 2012-12-11 DIAGNOSIS — Z8249 Family history of ischemic heart disease and other diseases of the circulatory system: Secondary | ICD-10-CM | POA: Insufficient documentation

## 2012-12-11 DIAGNOSIS — E7800 Pure hypercholesterolemia, unspecified: Secondary | ICD-10-CM | POA: Insufficient documentation

## 2012-12-11 DIAGNOSIS — Z5189 Encounter for other specified aftercare: Secondary | ICD-10-CM | POA: Insufficient documentation

## 2012-12-11 DIAGNOSIS — E119 Type 2 diabetes mellitus without complications: Secondary | ICD-10-CM | POA: Insufficient documentation

## 2012-12-11 DIAGNOSIS — R079 Chest pain, unspecified: Secondary | ICD-10-CM | POA: Insufficient documentation

## 2012-12-11 DIAGNOSIS — R0602 Shortness of breath: Secondary | ICD-10-CM | POA: Insufficient documentation

## 2012-12-11 DIAGNOSIS — Z9861 Coronary angioplasty status: Secondary | ICD-10-CM | POA: Insufficient documentation

## 2012-12-11 DIAGNOSIS — I10 Essential (primary) hypertension: Secondary | ICD-10-CM | POA: Insufficient documentation

## 2012-12-12 ENCOUNTER — Encounter (HOSPITAL_COMMUNITY)
Admission: RE | Admit: 2012-12-12 | Discharge: 2012-12-12 | Disposition: A | Discharge status: Home / Self Care | Payer: Self-pay | Source: Ambulatory Visit | Attending: Interventional Cardiology | Admitting: Interventional Cardiology

## 2012-12-16 ENCOUNTER — Encounter (HOSPITAL_COMMUNITY)
Admission: RE | Admit: 2012-12-16 | Discharge: 2012-12-16 | Disposition: A | Discharge status: Home / Self Care | Payer: Self-pay | Source: Ambulatory Visit | Attending: Interventional Cardiology | Admitting: Interventional Cardiology

## 2012-12-18 ENCOUNTER — Encounter (HOSPITAL_COMMUNITY)
Admission: RE | Admit: 2012-12-18 | Discharge: 2012-12-18 | Disposition: A | Discharge status: Home / Self Care | Payer: Self-pay | Source: Ambulatory Visit | Attending: Interventional Cardiology | Admitting: Interventional Cardiology

## 2012-12-19 ENCOUNTER — Encounter (HOSPITAL_COMMUNITY)
Admission: RE | Admit: 2012-12-19 | Discharge: 2012-12-19 | Disposition: A | Discharge status: Home / Self Care | Payer: Self-pay | Source: Ambulatory Visit | Attending: Interventional Cardiology | Admitting: Interventional Cardiology

## 2012-12-23 ENCOUNTER — Encounter (HOSPITAL_COMMUNITY): Payer: Self-pay

## 2012-12-24 ENCOUNTER — Other Ambulatory Visit: Payer: Self-pay | Admitting: Family Medicine

## 2012-12-24 DIAGNOSIS — R1033 Periumbilical pain: Secondary | ICD-10-CM

## 2012-12-24 DIAGNOSIS — R1012 Left upper quadrant pain: Secondary | ICD-10-CM

## 2012-12-25 ENCOUNTER — Encounter (HOSPITAL_COMMUNITY)
Admission: RE | Admit: 2012-12-25 | Discharge: 2012-12-25 | Disposition: A | Discharge status: Home / Self Care | Payer: Self-pay | Source: Ambulatory Visit | Attending: Interventional Cardiology | Admitting: Interventional Cardiology

## 2012-12-26 ENCOUNTER — Encounter (HOSPITAL_COMMUNITY): Payer: Self-pay

## 2012-12-26 ENCOUNTER — Ambulatory Visit
Admission: RE | Admit: 2012-12-26 | Discharge: 2012-12-26 | Disposition: A | Discharge status: Home / Self Care | Payer: Medicare Other | Source: Ambulatory Visit | Attending: Family Medicine | Admitting: Family Medicine

## 2012-12-26 DIAGNOSIS — R1033 Periumbilical pain: Secondary | ICD-10-CM

## 2012-12-26 DIAGNOSIS — R1012 Left upper quadrant pain: Secondary | ICD-10-CM

## 2012-12-30 ENCOUNTER — Encounter (HOSPITAL_COMMUNITY): Payer: Self-pay

## 2013-01-01 ENCOUNTER — Encounter (HOSPITAL_COMMUNITY)
Admission: RE | Admit: 2013-01-01 | Discharge: 2013-01-01 | Disposition: A | Discharge status: Home / Self Care | Payer: Self-pay | Source: Ambulatory Visit | Attending: Interventional Cardiology | Admitting: Interventional Cardiology

## 2013-01-02 ENCOUNTER — Encounter (HOSPITAL_COMMUNITY): Payer: Self-pay

## 2013-01-06 ENCOUNTER — Encounter (HOSPITAL_COMMUNITY)
Admission: RE | Admit: 2013-01-06 | Discharge: 2013-01-06 | Disposition: A | Discharge status: Home / Self Care | Payer: Self-pay | Source: Ambulatory Visit | Attending: Interventional Cardiology | Admitting: Interventional Cardiology

## 2013-01-08 ENCOUNTER — Encounter (HOSPITAL_COMMUNITY)
Admission: RE | Admit: 2013-01-08 | Discharge: 2013-01-08 | Disposition: A | Discharge status: Home / Self Care | Payer: Self-pay | Source: Ambulatory Visit | Attending: Interventional Cardiology | Admitting: Interventional Cardiology

## 2013-01-09 ENCOUNTER — Encounter (HOSPITAL_COMMUNITY)
Admission: RE | Admit: 2013-01-09 | Discharge: 2013-01-09 | Disposition: A | Discharge status: Home / Self Care | Payer: Self-pay | Source: Ambulatory Visit | Attending: Interventional Cardiology | Admitting: Interventional Cardiology

## 2013-01-13 ENCOUNTER — Encounter (HOSPITAL_COMMUNITY): Payer: Self-pay

## 2013-01-13 DIAGNOSIS — I2510 Atherosclerotic heart disease of native coronary artery without angina pectoris: Secondary | ICD-10-CM | POA: Insufficient documentation

## 2013-01-13 DIAGNOSIS — Z5189 Encounter for other specified aftercare: Secondary | ICD-10-CM | POA: Insufficient documentation

## 2013-01-13 DIAGNOSIS — Z8249 Family history of ischemic heart disease and other diseases of the circulatory system: Secondary | ICD-10-CM | POA: Insufficient documentation

## 2013-01-13 DIAGNOSIS — Z9861 Coronary angioplasty status: Secondary | ICD-10-CM | POA: Insufficient documentation

## 2013-01-13 DIAGNOSIS — E7800 Pure hypercholesterolemia, unspecified: Secondary | ICD-10-CM | POA: Insufficient documentation

## 2013-01-13 DIAGNOSIS — R079 Chest pain, unspecified: Secondary | ICD-10-CM | POA: Insufficient documentation

## 2013-01-13 DIAGNOSIS — E119 Type 2 diabetes mellitus without complications: Secondary | ICD-10-CM | POA: Insufficient documentation

## 2013-01-13 DIAGNOSIS — I10 Essential (primary) hypertension: Secondary | ICD-10-CM | POA: Insufficient documentation

## 2013-01-13 DIAGNOSIS — R0602 Shortness of breath: Secondary | ICD-10-CM | POA: Insufficient documentation

## 2013-01-15 ENCOUNTER — Encounter (HOSPITAL_COMMUNITY)
Admission: RE | Admit: 2013-01-15 | Discharge: 2013-01-15 | Disposition: A | Discharge status: Home / Self Care | Payer: Self-pay | Source: Ambulatory Visit | Attending: Interventional Cardiology | Admitting: Interventional Cardiology

## 2013-01-16 ENCOUNTER — Encounter (HOSPITAL_COMMUNITY)
Admission: RE | Admit: 2013-01-16 | Discharge: 2013-01-16 | Disposition: A | Discharge status: Home / Self Care | Payer: Self-pay | Source: Ambulatory Visit | Attending: Interventional Cardiology | Admitting: Interventional Cardiology

## 2013-01-20 ENCOUNTER — Encounter (HOSPITAL_COMMUNITY)
Admission: RE | Admit: 2013-01-20 | Discharge: 2013-01-20 | Disposition: A | Discharge status: Home / Self Care | Payer: Self-pay | Source: Ambulatory Visit | Attending: Interventional Cardiology | Admitting: Interventional Cardiology

## 2013-01-22 ENCOUNTER — Encounter (HOSPITAL_COMMUNITY): Payer: Self-pay

## 2013-01-23 ENCOUNTER — Encounter (HOSPITAL_COMMUNITY)
Admission: RE | Admit: 2013-01-23 | Discharge: 2013-01-23 | Disposition: A | Discharge status: Home / Self Care | Payer: Self-pay | Source: Ambulatory Visit | Attending: Interventional Cardiology | Admitting: Interventional Cardiology

## 2013-01-27 ENCOUNTER — Encounter (HOSPITAL_COMMUNITY)
Admission: RE | Admit: 2013-01-27 | Discharge: 2013-01-27 | Disposition: A | Discharge status: Home / Self Care | Payer: Self-pay | Source: Ambulatory Visit | Attending: Interventional Cardiology | Admitting: Interventional Cardiology

## 2013-01-29 ENCOUNTER — Encounter (HOSPITAL_COMMUNITY)
Admission: RE | Admit: 2013-01-29 | Discharge: 2013-01-29 | Disposition: A | Discharge status: Home / Self Care | Payer: Self-pay | Source: Ambulatory Visit | Attending: Interventional Cardiology | Admitting: Interventional Cardiology

## 2013-01-30 ENCOUNTER — Encounter (HOSPITAL_COMMUNITY)
Admission: RE | Admit: 2013-01-30 | Discharge: 2013-01-30 | Disposition: A | Discharge status: Home / Self Care | Payer: Self-pay | Source: Ambulatory Visit | Attending: Interventional Cardiology | Admitting: Interventional Cardiology

## 2013-02-03 ENCOUNTER — Encounter (HOSPITAL_COMMUNITY)
Admission: RE | Admit: 2013-02-03 | Discharge: 2013-02-03 | Disposition: A | Discharge status: Home / Self Care | Payer: Self-pay | Source: Ambulatory Visit | Attending: Interventional Cardiology | Admitting: Interventional Cardiology

## 2013-02-05 ENCOUNTER — Encounter (HOSPITAL_COMMUNITY)
Admission: RE | Admit: 2013-02-05 | Discharge: 2013-02-05 | Disposition: A | Discharge status: Home / Self Care | Payer: Self-pay | Source: Ambulatory Visit | Attending: Interventional Cardiology | Admitting: Interventional Cardiology

## 2013-02-06 ENCOUNTER — Encounter (HOSPITAL_COMMUNITY): Payer: Self-pay

## 2013-02-10 ENCOUNTER — Encounter (HOSPITAL_COMMUNITY)
Admission: RE | Admit: 2013-02-10 | Discharge: 2013-02-10 | Disposition: A | Discharge status: Home / Self Care | Payer: Self-pay | Source: Ambulatory Visit | Attending: Interventional Cardiology | Admitting: Interventional Cardiology

## 2013-02-10 DIAGNOSIS — R079 Chest pain, unspecified: Secondary | ICD-10-CM | POA: Insufficient documentation

## 2013-02-10 DIAGNOSIS — Z8249 Family history of ischemic heart disease and other diseases of the circulatory system: Secondary | ICD-10-CM | POA: Insufficient documentation

## 2013-02-10 DIAGNOSIS — Z5189 Encounter for other specified aftercare: Secondary | ICD-10-CM | POA: Insufficient documentation

## 2013-02-10 DIAGNOSIS — E7800 Pure hypercholesterolemia, unspecified: Secondary | ICD-10-CM | POA: Insufficient documentation

## 2013-02-10 DIAGNOSIS — E119 Type 2 diabetes mellitus without complications: Secondary | ICD-10-CM | POA: Insufficient documentation

## 2013-02-10 DIAGNOSIS — I2510 Atherosclerotic heart disease of native coronary artery without angina pectoris: Secondary | ICD-10-CM | POA: Insufficient documentation

## 2013-02-10 DIAGNOSIS — Z9861 Coronary angioplasty status: Secondary | ICD-10-CM | POA: Insufficient documentation

## 2013-02-10 DIAGNOSIS — I10 Essential (primary) hypertension: Secondary | ICD-10-CM | POA: Insufficient documentation

## 2013-02-10 DIAGNOSIS — R0602 Shortness of breath: Secondary | ICD-10-CM | POA: Insufficient documentation

## 2013-02-12 ENCOUNTER — Encounter (HOSPITAL_COMMUNITY)
Admission: RE | Admit: 2013-02-12 | Discharge: 2013-02-12 | Disposition: A | Discharge status: Home / Self Care | Payer: Self-pay | Source: Ambulatory Visit | Attending: Interventional Cardiology | Admitting: Interventional Cardiology

## 2013-02-17 ENCOUNTER — Encounter (HOSPITAL_COMMUNITY): Payer: Self-pay

## 2013-02-19 ENCOUNTER — Encounter (HOSPITAL_COMMUNITY): Payer: Self-pay

## 2013-02-20 ENCOUNTER — Encounter (HOSPITAL_COMMUNITY): Payer: Self-pay

## 2013-02-24 ENCOUNTER — Encounter (HOSPITAL_COMMUNITY)
Admission: RE | Admit: 2013-02-24 | Discharge: 2013-02-24 | Disposition: A | Discharge status: Home / Self Care | Payer: Self-pay | Source: Ambulatory Visit | Attending: Interventional Cardiology | Admitting: Interventional Cardiology

## 2013-02-26 ENCOUNTER — Encounter (HOSPITAL_COMMUNITY)
Admission: RE | Admit: 2013-02-26 | Discharge: 2013-02-26 | Disposition: A | Discharge status: Home / Self Care | Payer: Self-pay | Source: Ambulatory Visit | Attending: Interventional Cardiology | Admitting: Interventional Cardiology

## 2013-02-27 ENCOUNTER — Encounter (HOSPITAL_COMMUNITY)
Admission: RE | Admit: 2013-02-27 | Discharge: 2013-02-27 | Disposition: A | Discharge status: Home / Self Care | Payer: Self-pay | Source: Ambulatory Visit | Attending: Interventional Cardiology | Admitting: Interventional Cardiology

## 2013-03-03 ENCOUNTER — Encounter (HOSPITAL_COMMUNITY)
Admission: RE | Admit: 2013-03-03 | Discharge: 2013-03-03 | Disposition: A | Discharge status: Home / Self Care | Payer: Self-pay | Source: Ambulatory Visit | Attending: Interventional Cardiology | Admitting: Interventional Cardiology

## 2013-03-05 ENCOUNTER — Encounter (HOSPITAL_COMMUNITY)
Admission: RE | Admit: 2013-03-05 | Discharge: 2013-03-05 | Disposition: A | Discharge status: Home / Self Care | Payer: Self-pay | Source: Ambulatory Visit | Attending: Interventional Cardiology | Admitting: Interventional Cardiology

## 2013-03-06 ENCOUNTER — Encounter (HOSPITAL_COMMUNITY)
Admission: RE | Admit: 2013-03-06 | Discharge: 2013-03-06 | Disposition: A | Discharge status: Home / Self Care | Payer: Self-pay | Source: Ambulatory Visit | Attending: Interventional Cardiology | Admitting: Interventional Cardiology

## 2013-03-10 ENCOUNTER — Encounter (HOSPITAL_COMMUNITY): Payer: Self-pay

## 2013-03-12 ENCOUNTER — Encounter (HOSPITAL_COMMUNITY): Payer: Self-pay

## 2013-03-13 ENCOUNTER — Encounter (HOSPITAL_COMMUNITY): Payer: Self-pay

## 2013-03-13 DIAGNOSIS — E7800 Pure hypercholesterolemia, unspecified: Secondary | ICD-10-CM | POA: Insufficient documentation

## 2013-03-13 DIAGNOSIS — Z8249 Family history of ischemic heart disease and other diseases of the circulatory system: Secondary | ICD-10-CM | POA: Insufficient documentation

## 2013-03-13 DIAGNOSIS — R0602 Shortness of breath: Secondary | ICD-10-CM | POA: Insufficient documentation

## 2013-03-13 DIAGNOSIS — R079 Chest pain, unspecified: Secondary | ICD-10-CM | POA: Insufficient documentation

## 2013-03-13 DIAGNOSIS — E119 Type 2 diabetes mellitus without complications: Secondary | ICD-10-CM | POA: Insufficient documentation

## 2013-03-13 DIAGNOSIS — Z9861 Coronary angioplasty status: Secondary | ICD-10-CM | POA: Insufficient documentation

## 2013-03-13 DIAGNOSIS — Z5189 Encounter for other specified aftercare: Secondary | ICD-10-CM | POA: Insufficient documentation

## 2013-03-13 DIAGNOSIS — I2510 Atherosclerotic heart disease of native coronary artery without angina pectoris: Secondary | ICD-10-CM | POA: Insufficient documentation

## 2013-03-13 DIAGNOSIS — I10 Essential (primary) hypertension: Secondary | ICD-10-CM | POA: Insufficient documentation

## 2013-03-17 ENCOUNTER — Encounter (HOSPITAL_COMMUNITY): Payer: Self-pay

## 2013-03-19 ENCOUNTER — Encounter (HOSPITAL_COMMUNITY)
Admission: RE | Admit: 2013-03-19 | Discharge: 2013-03-19 | Disposition: A | Discharge status: Home / Self Care | Payer: Self-pay | Source: Ambulatory Visit | Attending: Interventional Cardiology | Admitting: Interventional Cardiology

## 2013-03-20 ENCOUNTER — Encounter (HOSPITAL_COMMUNITY)
Admission: RE | Admit: 2013-03-20 | Discharge: 2013-03-20 | Disposition: A | Discharge status: Home / Self Care | Payer: Self-pay | Source: Ambulatory Visit | Attending: Interventional Cardiology | Admitting: Interventional Cardiology

## 2013-03-24 ENCOUNTER — Encounter (HOSPITAL_COMMUNITY)
Admission: RE | Admit: 2013-03-24 | Discharge: 2013-03-24 | Disposition: A | Discharge status: Home / Self Care | Payer: Self-pay | Source: Ambulatory Visit | Attending: Interventional Cardiology | Admitting: Interventional Cardiology

## 2013-03-26 ENCOUNTER — Encounter (HOSPITAL_COMMUNITY): Payer: Self-pay

## 2013-03-27 ENCOUNTER — Encounter (HOSPITAL_COMMUNITY): Payer: Self-pay

## 2013-03-31 ENCOUNTER — Encounter (HOSPITAL_COMMUNITY)
Admission: RE | Admit: 2013-03-31 | Discharge: 2013-03-31 | Disposition: A | Discharge status: Home / Self Care | Payer: Self-pay | Source: Ambulatory Visit | Attending: Interventional Cardiology | Admitting: Interventional Cardiology

## 2013-04-02 ENCOUNTER — Encounter (HOSPITAL_COMMUNITY): Payer: Self-pay

## 2013-04-03 ENCOUNTER — Encounter (HOSPITAL_COMMUNITY): Payer: Self-pay

## 2013-04-07 ENCOUNTER — Encounter (HOSPITAL_COMMUNITY)
Admission: RE | Admit: 2013-04-07 | Discharge: 2013-04-07 | Disposition: A | Discharge status: Home / Self Care | Payer: Self-pay | Source: Ambulatory Visit | Attending: Interventional Cardiology | Admitting: Interventional Cardiology

## 2013-04-09 ENCOUNTER — Encounter (HOSPITAL_COMMUNITY)
Admission: RE | Admit: 2013-04-09 | Discharge: 2013-04-09 | Disposition: A | Discharge status: Home / Self Care | Payer: Self-pay | Source: Ambulatory Visit | Attending: Interventional Cardiology | Admitting: Interventional Cardiology

## 2013-04-10 ENCOUNTER — Encounter (HOSPITAL_COMMUNITY)
Admission: RE | Admit: 2013-04-10 | Discharge: 2013-04-10 | Disposition: A | Discharge status: Home / Self Care | Payer: Self-pay | Source: Ambulatory Visit | Attending: Interventional Cardiology | Admitting: Interventional Cardiology

## 2013-04-14 ENCOUNTER — Encounter (HOSPITAL_COMMUNITY)
Admission: RE | Admit: 2013-04-14 | Discharge: 2013-04-14 | Disposition: A | Discharge status: Home / Self Care | Payer: Self-pay | Source: Ambulatory Visit | Attending: Interventional Cardiology | Admitting: Interventional Cardiology

## 2013-04-14 DIAGNOSIS — I2510 Atherosclerotic heart disease of native coronary artery without angina pectoris: Secondary | ICD-10-CM | POA: Insufficient documentation

## 2013-04-14 DIAGNOSIS — Z9861 Coronary angioplasty status: Secondary | ICD-10-CM | POA: Insufficient documentation

## 2013-04-14 DIAGNOSIS — I10 Essential (primary) hypertension: Secondary | ICD-10-CM | POA: Insufficient documentation

## 2013-04-14 DIAGNOSIS — Z5189 Encounter for other specified aftercare: Secondary | ICD-10-CM | POA: Insufficient documentation

## 2013-04-14 DIAGNOSIS — R079 Chest pain, unspecified: Secondary | ICD-10-CM | POA: Insufficient documentation

## 2013-04-14 DIAGNOSIS — E7800 Pure hypercholesterolemia, unspecified: Secondary | ICD-10-CM | POA: Insufficient documentation

## 2013-04-14 DIAGNOSIS — Z8249 Family history of ischemic heart disease and other diseases of the circulatory system: Secondary | ICD-10-CM | POA: Insufficient documentation

## 2013-04-14 DIAGNOSIS — R0602 Shortness of breath: Secondary | ICD-10-CM | POA: Insufficient documentation

## 2013-04-14 DIAGNOSIS — E119 Type 2 diabetes mellitus without complications: Secondary | ICD-10-CM | POA: Insufficient documentation

## 2013-04-16 ENCOUNTER — Encounter (HOSPITAL_COMMUNITY)
Admission: RE | Admit: 2013-04-16 | Discharge: 2013-04-16 | Disposition: A | Discharge status: Home / Self Care | Payer: Self-pay | Source: Ambulatory Visit | Attending: Interventional Cardiology | Admitting: Interventional Cardiology

## 2013-04-17 ENCOUNTER — Encounter (HOSPITAL_COMMUNITY)
Admission: RE | Admit: 2013-04-17 | Discharge: 2013-04-17 | Disposition: A | Discharge status: Home / Self Care | Payer: Self-pay | Source: Ambulatory Visit | Attending: Interventional Cardiology | Admitting: Interventional Cardiology

## 2013-04-21 ENCOUNTER — Encounter (HOSPITAL_COMMUNITY): Payer: Self-pay

## 2013-04-23 ENCOUNTER — Encounter (HOSPITAL_COMMUNITY): Payer: Self-pay

## 2013-04-24 ENCOUNTER — Encounter (HOSPITAL_COMMUNITY): Payer: Self-pay

## 2013-04-28 ENCOUNTER — Encounter (HOSPITAL_COMMUNITY): Payer: Self-pay

## 2013-04-30 ENCOUNTER — Encounter (HOSPITAL_COMMUNITY)
Admission: RE | Admit: 2013-04-30 | Discharge: 2013-04-30 | Disposition: A | Discharge status: Home / Self Care | Payer: Self-pay | Source: Ambulatory Visit | Attending: Interventional Cardiology | Admitting: Interventional Cardiology

## 2013-05-01 ENCOUNTER — Encounter (HOSPITAL_COMMUNITY)
Admission: RE | Admit: 2013-05-01 | Discharge: 2013-05-01 | Disposition: A | Discharge status: Home / Self Care | Payer: Self-pay | Source: Ambulatory Visit | Attending: Interventional Cardiology | Admitting: Interventional Cardiology

## 2013-05-05 ENCOUNTER — Encounter (HOSPITAL_COMMUNITY)
Admission: RE | Admit: 2013-05-05 | Discharge: 2013-05-05 | Disposition: A | Discharge status: Home / Self Care | Payer: Self-pay | Source: Ambulatory Visit | Attending: Interventional Cardiology | Admitting: Interventional Cardiology

## 2013-05-07 ENCOUNTER — Encounter (HOSPITAL_COMMUNITY)
Admission: RE | Admit: 2013-05-07 | Discharge: 2013-05-07 | Disposition: A | Discharge status: Home / Self Care | Payer: Self-pay | Source: Ambulatory Visit | Attending: Interventional Cardiology | Admitting: Interventional Cardiology

## 2013-05-08 ENCOUNTER — Encounter (HOSPITAL_COMMUNITY): Payer: Self-pay

## 2013-05-12 ENCOUNTER — Encounter (HOSPITAL_COMMUNITY): Payer: Self-pay

## 2013-05-14 ENCOUNTER — Encounter (HOSPITAL_COMMUNITY)
Admission: RE | Admit: 2013-05-14 | Discharge: 2013-05-14 | Disposition: A | Discharge status: Home / Self Care | Payer: Self-pay | Source: Ambulatory Visit | Attending: Interventional Cardiology | Admitting: Interventional Cardiology

## 2013-05-14 DIAGNOSIS — R0602 Shortness of breath: Secondary | ICD-10-CM | POA: Insufficient documentation

## 2013-05-14 DIAGNOSIS — R079 Chest pain, unspecified: Secondary | ICD-10-CM | POA: Insufficient documentation

## 2013-05-14 DIAGNOSIS — E7800 Pure hypercholesterolemia, unspecified: Secondary | ICD-10-CM | POA: Insufficient documentation

## 2013-05-14 DIAGNOSIS — I10 Essential (primary) hypertension: Secondary | ICD-10-CM | POA: Insufficient documentation

## 2013-05-14 DIAGNOSIS — E119 Type 2 diabetes mellitus without complications: Secondary | ICD-10-CM | POA: Insufficient documentation

## 2013-05-14 DIAGNOSIS — Z5189 Encounter for other specified aftercare: Secondary | ICD-10-CM | POA: Insufficient documentation

## 2013-05-14 DIAGNOSIS — Z9861 Coronary angioplasty status: Secondary | ICD-10-CM | POA: Insufficient documentation

## 2013-05-14 DIAGNOSIS — I2510 Atherosclerotic heart disease of native coronary artery without angina pectoris: Secondary | ICD-10-CM | POA: Insufficient documentation

## 2013-05-14 DIAGNOSIS — Z8249 Family history of ischemic heart disease and other diseases of the circulatory system: Secondary | ICD-10-CM | POA: Insufficient documentation

## 2013-05-15 ENCOUNTER — Encounter (HOSPITAL_COMMUNITY)
Admission: RE | Admit: 2013-05-15 | Discharge: 2013-05-15 | Disposition: A | Discharge status: Home / Self Care | Payer: Self-pay | Source: Ambulatory Visit | Attending: Interventional Cardiology | Admitting: Interventional Cardiology

## 2013-05-19 ENCOUNTER — Encounter (HOSPITAL_COMMUNITY)
Admission: RE | Admit: 2013-05-19 | Discharge: 2013-05-19 | Disposition: A | Discharge status: Home / Self Care | Payer: Self-pay | Source: Ambulatory Visit | Attending: Interventional Cardiology | Admitting: Interventional Cardiology

## 2013-05-21 ENCOUNTER — Encounter (HOSPITAL_COMMUNITY)
Admission: RE | Admit: 2013-05-21 | Discharge: 2013-05-21 | Disposition: A | Discharge status: Home / Self Care | Payer: Self-pay | Source: Ambulatory Visit | Attending: Interventional Cardiology | Admitting: Interventional Cardiology

## 2013-05-22 ENCOUNTER — Encounter (HOSPITAL_COMMUNITY): Payer: Medicare Other

## 2013-05-25 ENCOUNTER — Other Ambulatory Visit (HOSPITAL_COMMUNITY): Payer: Self-pay | Admitting: Family Medicine

## 2013-05-25 ENCOUNTER — Other Ambulatory Visit: Payer: Self-pay

## 2013-05-25 DIAGNOSIS — Z1231 Encounter for screening mammogram for malignant neoplasm of breast: Secondary | ICD-10-CM

## 2013-05-26 ENCOUNTER — Encounter (HOSPITAL_COMMUNITY): Payer: Medicare Other

## 2013-05-28 ENCOUNTER — Encounter (HOSPITAL_COMMUNITY)
Admission: RE | Admit: 2013-05-28 | Discharge: 2013-05-28 | Disposition: A | Discharge status: Home / Self Care | Payer: Self-pay | Source: Ambulatory Visit | Attending: Interventional Cardiology | Admitting: Interventional Cardiology

## 2013-05-29 ENCOUNTER — Encounter (HOSPITAL_COMMUNITY)
Admission: RE | Admit: 2013-05-29 | Discharge: 2013-05-29 | Disposition: A | Discharge status: Home / Self Care | Payer: Self-pay | Source: Ambulatory Visit | Attending: Interventional Cardiology | Admitting: Interventional Cardiology

## 2013-06-02 ENCOUNTER — Encounter (HOSPITAL_COMMUNITY)
Admission: RE | Admit: 2013-06-02 | Discharge: 2013-06-02 | Disposition: A | Discharge status: Home / Self Care | Payer: Self-pay | Source: Ambulatory Visit | Attending: Interventional Cardiology | Admitting: Interventional Cardiology

## 2013-06-04 ENCOUNTER — Encounter (HOSPITAL_COMMUNITY)
Admission: RE | Admit: 2013-06-04 | Discharge: 2013-06-04 | Disposition: A | Discharge status: Home / Self Care | Payer: Self-pay | Source: Ambulatory Visit | Attending: Interventional Cardiology | Admitting: Interventional Cardiology

## 2013-06-05 ENCOUNTER — Encounter (HOSPITAL_COMMUNITY)
Admission: RE | Admit: 2013-06-05 | Discharge: 2013-06-05 | Disposition: A | Discharge status: Home / Self Care | Payer: Medicare Other | Source: Ambulatory Visit | Attending: Interventional Cardiology | Admitting: Interventional Cardiology

## 2013-06-05 ENCOUNTER — Ambulatory Visit (HOSPITAL_COMMUNITY): Payer: Medicare Other

## 2013-06-09 ENCOUNTER — Encounter (HOSPITAL_COMMUNITY): Payer: Medicare Other

## 2013-06-11 ENCOUNTER — Encounter (HOSPITAL_COMMUNITY)
Admission: RE | Admit: 2013-06-11 | Discharge: 2013-06-11 | Disposition: A | Discharge status: Home / Self Care | Payer: Self-pay | Source: Ambulatory Visit | Attending: Interventional Cardiology | Admitting: Interventional Cardiology

## 2013-06-12 ENCOUNTER — Encounter (HOSPITAL_COMMUNITY)
Admission: RE | Admit: 2013-06-12 | Discharge: 2013-06-12 | Disposition: A | Discharge status: Home / Self Care | Payer: Self-pay | Source: Ambulatory Visit | Attending: Interventional Cardiology | Admitting: Interventional Cardiology

## 2013-06-16 ENCOUNTER — Encounter (HOSPITAL_COMMUNITY): Payer: Medicare Other

## 2013-06-16 DIAGNOSIS — Z8249 Family history of ischemic heart disease and other diseases of the circulatory system: Secondary | ICD-10-CM | POA: Insufficient documentation

## 2013-06-16 DIAGNOSIS — E119 Type 2 diabetes mellitus without complications: Secondary | ICD-10-CM | POA: Insufficient documentation

## 2013-06-16 DIAGNOSIS — I10 Essential (primary) hypertension: Secondary | ICD-10-CM | POA: Insufficient documentation

## 2013-06-16 DIAGNOSIS — Z5189 Encounter for other specified aftercare: Secondary | ICD-10-CM | POA: Insufficient documentation

## 2013-06-16 DIAGNOSIS — Z9861 Coronary angioplasty status: Secondary | ICD-10-CM | POA: Insufficient documentation

## 2013-06-16 DIAGNOSIS — R079 Chest pain, unspecified: Secondary | ICD-10-CM | POA: Insufficient documentation

## 2013-06-16 DIAGNOSIS — R0602 Shortness of breath: Secondary | ICD-10-CM | POA: Insufficient documentation

## 2013-06-16 DIAGNOSIS — E7800 Pure hypercholesterolemia, unspecified: Secondary | ICD-10-CM | POA: Insufficient documentation

## 2013-06-16 DIAGNOSIS — I2510 Atherosclerotic heart disease of native coronary artery without angina pectoris: Secondary | ICD-10-CM | POA: Insufficient documentation

## 2013-06-18 ENCOUNTER — Encounter (HOSPITAL_COMMUNITY)
Admission: RE | Admit: 2013-06-18 | Discharge: 2013-06-18 | Disposition: A | Discharge status: Home / Self Care | Payer: Self-pay | Source: Ambulatory Visit | Attending: Interventional Cardiology | Admitting: Interventional Cardiology

## 2013-06-19 ENCOUNTER — Ambulatory Visit: Payer: Medicare Other

## 2013-06-19 ENCOUNTER — Encounter (HOSPITAL_COMMUNITY)
Admission: RE | Admit: 2013-06-19 | Discharge: 2013-06-19 | Disposition: A | Discharge status: Home / Self Care | Payer: Self-pay | Source: Ambulatory Visit | Attending: Interventional Cardiology | Admitting: Interventional Cardiology

## 2013-06-22 ENCOUNTER — Ambulatory Visit: Payer: Medicare Other | Admitting: Cardiology

## 2013-06-23 ENCOUNTER — Encounter (HOSPITAL_COMMUNITY)
Admission: RE | Admit: 2013-06-23 | Discharge: 2013-06-23 | Disposition: A | Discharge status: Home / Self Care | Payer: Self-pay | Source: Ambulatory Visit | Attending: Interventional Cardiology | Admitting: Interventional Cardiology

## 2013-06-24 ENCOUNTER — Encounter: Payer: Self-pay | Admitting: Interventional Cardiology

## 2013-06-25 ENCOUNTER — Ambulatory Visit
Admission: RE | Admit: 2013-06-25 | Discharge: 2013-06-25 | Disposition: A | Discharge status: Home / Self Care | Payer: Medicare Other | Source: Ambulatory Visit

## 2013-06-25 ENCOUNTER — Encounter (HOSPITAL_COMMUNITY)
Admission: RE | Admit: 2013-06-25 | Discharge: 2013-06-25 | Disposition: A | Discharge status: Home / Self Care | Payer: Self-pay | Source: Ambulatory Visit | Attending: Interventional Cardiology | Admitting: Interventional Cardiology

## 2013-06-25 DIAGNOSIS — Z1231 Encounter for screening mammogram for malignant neoplasm of breast: Secondary | ICD-10-CM

## 2013-06-26 ENCOUNTER — Encounter (HOSPITAL_COMMUNITY)
Admission: RE | Admit: 2013-06-26 | Discharge: 2013-06-26 | Disposition: A | Discharge status: Home / Self Care | Payer: Self-pay | Source: Ambulatory Visit | Attending: Interventional Cardiology | Admitting: Interventional Cardiology

## 2013-06-30 ENCOUNTER — Encounter (HOSPITAL_COMMUNITY): Payer: Medicare Other

## 2013-07-02 ENCOUNTER — Encounter (HOSPITAL_COMMUNITY): Payer: Medicare Other

## 2013-07-03 ENCOUNTER — Encounter (HOSPITAL_COMMUNITY)
Admission: RE | Admit: 2013-07-03 | Discharge: 2013-07-03 | Disposition: A | Discharge status: Home / Self Care | Payer: Self-pay | Source: Ambulatory Visit | Attending: Interventional Cardiology | Admitting: Interventional Cardiology

## 2013-07-07 ENCOUNTER — Encounter (HOSPITAL_COMMUNITY)
Admission: RE | Admit: 2013-07-07 | Discharge: 2013-07-07 | Disposition: A | Discharge status: Home / Self Care | Payer: Self-pay | Source: Ambulatory Visit | Attending: Interventional Cardiology | Admitting: Interventional Cardiology

## 2013-07-14 ENCOUNTER — Encounter (HOSPITAL_COMMUNITY): Payer: Medicare Other | Attending: Interventional Cardiology

## 2013-07-14 DIAGNOSIS — E119 Type 2 diabetes mellitus without complications: Secondary | ICD-10-CM | POA: Insufficient documentation

## 2013-07-14 DIAGNOSIS — Z8249 Family history of ischemic heart disease and other diseases of the circulatory system: Secondary | ICD-10-CM | POA: Insufficient documentation

## 2013-07-14 DIAGNOSIS — R0602 Shortness of breath: Secondary | ICD-10-CM | POA: Insufficient documentation

## 2013-07-14 DIAGNOSIS — Z9861 Coronary angioplasty status: Secondary | ICD-10-CM | POA: Insufficient documentation

## 2013-07-14 DIAGNOSIS — R079 Chest pain, unspecified: Secondary | ICD-10-CM | POA: Insufficient documentation

## 2013-07-14 DIAGNOSIS — I2510 Atherosclerotic heart disease of native coronary artery without angina pectoris: Secondary | ICD-10-CM | POA: Insufficient documentation

## 2013-07-14 DIAGNOSIS — E7800 Pure hypercholesterolemia, unspecified: Secondary | ICD-10-CM | POA: Insufficient documentation

## 2013-07-14 DIAGNOSIS — I10 Essential (primary) hypertension: Secondary | ICD-10-CM | POA: Insufficient documentation

## 2013-07-14 DIAGNOSIS — Z5189 Encounter for other specified aftercare: Secondary | ICD-10-CM | POA: Insufficient documentation

## 2013-07-16 ENCOUNTER — Encounter (HOSPITAL_COMMUNITY): Payer: Medicare Other

## 2013-07-17 ENCOUNTER — Encounter (HOSPITAL_COMMUNITY): Payer: Medicare Other

## 2013-07-21 ENCOUNTER — Encounter (HOSPITAL_COMMUNITY): Payer: Medicare Other

## 2013-07-23 ENCOUNTER — Encounter (HOSPITAL_COMMUNITY): Payer: Medicare Other

## 2013-07-24 ENCOUNTER — Encounter (HOSPITAL_COMMUNITY): Payer: Medicare Other

## 2013-07-28 ENCOUNTER — Encounter (HOSPITAL_COMMUNITY): Payer: Medicare Other

## 2013-07-30 ENCOUNTER — Encounter (HOSPITAL_COMMUNITY): Payer: Medicare Other

## 2013-07-31 ENCOUNTER — Encounter (HOSPITAL_COMMUNITY): Payer: Medicare Other

## 2013-08-04 ENCOUNTER — Encounter (HOSPITAL_COMMUNITY): Payer: Medicare Other

## 2013-08-11 ENCOUNTER — Encounter (HOSPITAL_COMMUNITY): Payer: Medicare Other

## 2013-08-11 ENCOUNTER — Telehealth (HOSPITAL_COMMUNITY): Payer: Self-pay | Admitting: *Deleted

## 2013-08-14 ENCOUNTER — Encounter (HOSPITAL_COMMUNITY): Payer: Medicare Other

## 2013-08-14 DIAGNOSIS — Z5189 Encounter for other specified aftercare: Secondary | ICD-10-CM | POA: Insufficient documentation

## 2013-08-14 DIAGNOSIS — E119 Type 2 diabetes mellitus without complications: Secondary | ICD-10-CM | POA: Insufficient documentation

## 2013-08-14 DIAGNOSIS — Z9861 Coronary angioplasty status: Secondary | ICD-10-CM | POA: Insufficient documentation

## 2013-08-14 DIAGNOSIS — E7800 Pure hypercholesterolemia, unspecified: Secondary | ICD-10-CM | POA: Insufficient documentation

## 2013-08-14 DIAGNOSIS — I2510 Atherosclerotic heart disease of native coronary artery without angina pectoris: Secondary | ICD-10-CM | POA: Insufficient documentation

## 2013-08-14 DIAGNOSIS — I10 Essential (primary) hypertension: Secondary | ICD-10-CM | POA: Insufficient documentation

## 2013-08-14 DIAGNOSIS — R0602 Shortness of breath: Secondary | ICD-10-CM | POA: Insufficient documentation

## 2013-08-14 DIAGNOSIS — Z8249 Family history of ischemic heart disease and other diseases of the circulatory system: Secondary | ICD-10-CM | POA: Insufficient documentation

## 2013-08-14 DIAGNOSIS — R079 Chest pain, unspecified: Secondary | ICD-10-CM | POA: Insufficient documentation

## 2013-08-18 ENCOUNTER — Encounter (HOSPITAL_COMMUNITY): Payer: Medicare Other

## 2013-08-20 ENCOUNTER — Encounter (HOSPITAL_COMMUNITY): Payer: Medicare Other

## 2013-08-21 ENCOUNTER — Encounter (HOSPITAL_COMMUNITY): Payer: Medicare Other

## 2013-08-25 ENCOUNTER — Encounter (HOSPITAL_COMMUNITY)
Admission: RE | Admit: 2013-08-25 | Discharge: 2013-08-25 | Disposition: A | Discharge status: Home / Self Care | Payer: Self-pay | Source: Ambulatory Visit | Attending: Interventional Cardiology | Admitting: Interventional Cardiology

## 2013-08-27 ENCOUNTER — Encounter (HOSPITAL_COMMUNITY)
Admission: RE | Admit: 2013-08-27 | Discharge: 2013-08-27 | Disposition: A | Discharge status: Home / Self Care | Payer: Self-pay | Source: Ambulatory Visit | Attending: Interventional Cardiology | Admitting: Interventional Cardiology

## 2013-08-28 ENCOUNTER — Encounter (HOSPITAL_COMMUNITY): Payer: Medicare Other

## 2013-09-01 ENCOUNTER — Encounter (HOSPITAL_COMMUNITY)
Admission: RE | Admit: 2013-09-01 | Discharge: 2013-09-01 | Disposition: A | Discharge status: Home / Self Care | Payer: Self-pay | Source: Ambulatory Visit | Attending: Interventional Cardiology | Admitting: Interventional Cardiology

## 2013-09-03 ENCOUNTER — Encounter (HOSPITAL_COMMUNITY)
Admission: RE | Admit: 2013-09-03 | Discharge: 2013-09-03 | Disposition: A | Discharge status: Home / Self Care | Payer: Self-pay | Source: Ambulatory Visit | Attending: Interventional Cardiology | Admitting: Interventional Cardiology

## 2013-09-04 ENCOUNTER — Encounter (HOSPITAL_COMMUNITY): Payer: Medicare Other

## 2013-09-08 ENCOUNTER — Encounter (HOSPITAL_COMMUNITY)
Admission: RE | Admit: 2013-09-08 | Discharge: 2013-09-08 | Disposition: A | Discharge status: Home / Self Care | Payer: Self-pay | Source: Ambulatory Visit | Attending: Interventional Cardiology | Admitting: Interventional Cardiology

## 2013-09-10 ENCOUNTER — Encounter (HOSPITAL_COMMUNITY): Payer: Medicare Other

## 2013-09-11 ENCOUNTER — Encounter (HOSPITAL_COMMUNITY)
Admission: RE | Admit: 2013-09-11 | Discharge: 2013-09-11 | Disposition: A | Discharge status: Home / Self Care | Payer: Self-pay | Source: Ambulatory Visit | Attending: Interventional Cardiology | Admitting: Interventional Cardiology

## 2013-09-15 ENCOUNTER — Encounter (HOSPITAL_COMMUNITY): Payer: Medicare Other

## 2013-09-15 DIAGNOSIS — I2510 Atherosclerotic heart disease of native coronary artery without angina pectoris: Secondary | ICD-10-CM | POA: Insufficient documentation

## 2013-09-15 DIAGNOSIS — R079 Chest pain, unspecified: Secondary | ICD-10-CM | POA: Insufficient documentation

## 2013-09-15 DIAGNOSIS — R0602 Shortness of breath: Secondary | ICD-10-CM | POA: Insufficient documentation

## 2013-09-15 DIAGNOSIS — Z9861 Coronary angioplasty status: Secondary | ICD-10-CM | POA: Insufficient documentation

## 2013-09-15 DIAGNOSIS — E119 Type 2 diabetes mellitus without complications: Secondary | ICD-10-CM | POA: Insufficient documentation

## 2013-09-15 DIAGNOSIS — E7800 Pure hypercholesterolemia, unspecified: Secondary | ICD-10-CM | POA: Insufficient documentation

## 2013-09-15 DIAGNOSIS — Z8249 Family history of ischemic heart disease and other diseases of the circulatory system: Secondary | ICD-10-CM | POA: Insufficient documentation

## 2013-09-15 DIAGNOSIS — Z5189 Encounter for other specified aftercare: Secondary | ICD-10-CM | POA: Insufficient documentation

## 2013-09-15 DIAGNOSIS — I10 Essential (primary) hypertension: Secondary | ICD-10-CM | POA: Insufficient documentation

## 2013-09-17 ENCOUNTER — Encounter (HOSPITAL_COMMUNITY)
Admission: RE | Admit: 2013-09-17 | Discharge: 2013-09-17 | Disposition: A | Discharge status: Home / Self Care | Payer: Self-pay | Source: Ambulatory Visit | Attending: Interventional Cardiology | Admitting: Interventional Cardiology

## 2013-09-18 ENCOUNTER — Encounter (HOSPITAL_COMMUNITY): Payer: Medicare Other

## 2013-09-22 ENCOUNTER — Encounter (HOSPITAL_COMMUNITY)
Admission: RE | Admit: 2013-09-22 | Discharge: 2013-09-22 | Disposition: A | Discharge status: Home / Self Care | Payer: Self-pay | Source: Ambulatory Visit | Attending: Interventional Cardiology | Admitting: Interventional Cardiology

## 2013-09-24 ENCOUNTER — Encounter (HOSPITAL_COMMUNITY): Payer: Medicare Other

## 2013-09-25 ENCOUNTER — Encounter (HOSPITAL_COMMUNITY): Payer: Medicare Other

## 2013-09-29 ENCOUNTER — Encounter (HOSPITAL_COMMUNITY): Payer: Medicare Other

## 2013-09-30 ENCOUNTER — Ambulatory Visit: Payer: Medicare Other | Admitting: Interventional Cardiology

## 2013-10-01 ENCOUNTER — Encounter (HOSPITAL_COMMUNITY): Payer: Medicare Other

## 2013-10-01 ENCOUNTER — Ambulatory Visit: Payer: Medicare Other | Admitting: Interventional Cardiology

## 2013-10-02 ENCOUNTER — Encounter (HOSPITAL_COMMUNITY): Payer: Medicare Other

## 2013-10-06 ENCOUNTER — Encounter (HOSPITAL_COMMUNITY): Payer: Medicare Other

## 2013-10-08 ENCOUNTER — Encounter (HOSPITAL_COMMUNITY): Payer: Medicare Other

## 2013-10-09 ENCOUNTER — Encounter (HOSPITAL_COMMUNITY): Payer: Medicare Other

## 2013-10-13 ENCOUNTER — Encounter (HOSPITAL_COMMUNITY): Payer: Medicare Other

## 2013-10-13 DIAGNOSIS — E119 Type 2 diabetes mellitus without complications: Secondary | ICD-10-CM | POA: Insufficient documentation

## 2013-10-13 DIAGNOSIS — Z5189 Encounter for other specified aftercare: Secondary | ICD-10-CM | POA: Insufficient documentation

## 2013-10-13 DIAGNOSIS — R079 Chest pain, unspecified: Secondary | ICD-10-CM | POA: Insufficient documentation

## 2013-10-13 DIAGNOSIS — E7800 Pure hypercholesterolemia, unspecified: Secondary | ICD-10-CM | POA: Insufficient documentation

## 2013-10-13 DIAGNOSIS — I10 Essential (primary) hypertension: Secondary | ICD-10-CM | POA: Insufficient documentation

## 2013-10-13 DIAGNOSIS — Z8249 Family history of ischemic heart disease and other diseases of the circulatory system: Secondary | ICD-10-CM | POA: Insufficient documentation

## 2013-10-13 DIAGNOSIS — R0602 Shortness of breath: Secondary | ICD-10-CM | POA: Insufficient documentation

## 2013-10-13 DIAGNOSIS — I2510 Atherosclerotic heart disease of native coronary artery without angina pectoris: Secondary | ICD-10-CM | POA: Insufficient documentation

## 2013-10-13 DIAGNOSIS — Z9861 Coronary angioplasty status: Secondary | ICD-10-CM | POA: Insufficient documentation

## 2013-10-15 ENCOUNTER — Encounter (HOSPITAL_COMMUNITY): Payer: Medicare Other

## 2013-10-16 ENCOUNTER — Encounter (HOSPITAL_COMMUNITY): Payer: Medicare Other

## 2013-10-20 ENCOUNTER — Encounter (HOSPITAL_COMMUNITY)
Admission: RE | Admit: 2013-10-20 | Discharge: 2013-10-20 | Disposition: A | Discharge status: Home / Self Care | Payer: Self-pay | Source: Ambulatory Visit | Attending: Interventional Cardiology | Admitting: Interventional Cardiology

## 2013-10-22 ENCOUNTER — Encounter (HOSPITAL_COMMUNITY)
Admission: RE | Admit: 2013-10-22 | Discharge: 2013-10-22 | Disposition: A | Discharge status: Home / Self Care | Payer: Self-pay | Source: Ambulatory Visit | Attending: Interventional Cardiology | Admitting: Interventional Cardiology

## 2013-10-23 ENCOUNTER — Encounter (HOSPITAL_COMMUNITY): Admission: RE | Admit: 2013-10-23 | Payer: Medicare Other | Source: Ambulatory Visit

## 2013-10-27 ENCOUNTER — Encounter (HOSPITAL_COMMUNITY)
Admission: RE | Admit: 2013-10-27 | Discharge: 2013-10-27 | Disposition: A | Discharge status: Home / Self Care | Payer: Self-pay | Source: Ambulatory Visit | Attending: Interventional Cardiology | Admitting: Interventional Cardiology

## 2013-10-29 ENCOUNTER — Encounter (HOSPITAL_COMMUNITY)
Admission: RE | Admit: 2013-10-29 | Discharge: 2013-10-29 | Disposition: A | Discharge status: Home / Self Care | Payer: Self-pay | Source: Ambulatory Visit | Attending: Interventional Cardiology | Admitting: Interventional Cardiology

## 2013-10-30 ENCOUNTER — Encounter (HOSPITAL_COMMUNITY): Payer: Medicare Other

## 2013-11-03 ENCOUNTER — Encounter (HOSPITAL_COMMUNITY): Payer: Medicare Other

## 2013-11-05 ENCOUNTER — Encounter (HOSPITAL_COMMUNITY)
Admission: RE | Admit: 2013-11-05 | Discharge: 2013-11-05 | Disposition: A | Discharge status: Home / Self Care | Payer: Self-pay | Source: Ambulatory Visit | Attending: Interventional Cardiology | Admitting: Interventional Cardiology

## 2013-11-06 ENCOUNTER — Encounter (HOSPITAL_COMMUNITY): Payer: Medicare Other

## 2013-11-10 ENCOUNTER — Encounter (HOSPITAL_COMMUNITY): Payer: Medicare Other

## 2013-11-17 ENCOUNTER — Encounter: Payer: Self-pay | Admitting: Interventional Cardiology

## 2013-11-17 ENCOUNTER — Encounter (HOSPITAL_COMMUNITY): Payer: Self-pay | Attending: Interventional Cardiology

## 2013-11-17 ENCOUNTER — Ambulatory Visit (INDEPENDENT_AMBULATORY_CARE_PROVIDER_SITE_OTHER): Payer: Medicare Other | Admitting: Interventional Cardiology

## 2013-11-17 VITALS — BP 129/78 | HR 87 | Ht 62.0 in | Wt 162.8 lb

## 2013-11-17 DIAGNOSIS — I10 Essential (primary) hypertension: Secondary | ICD-10-CM

## 2013-11-17 DIAGNOSIS — I2510 Atherosclerotic heart disease of native coronary artery without angina pectoris: Secondary | ICD-10-CM

## 2013-11-17 DIAGNOSIS — R0989 Other specified symptoms and signs involving the circulatory and respiratory systems: Secondary | ICD-10-CM | POA: Insufficient documentation

## 2013-11-17 DIAGNOSIS — E119 Type 2 diabetes mellitus without complications: Secondary | ICD-10-CM

## 2013-11-17 DIAGNOSIS — E785 Hyperlipidemia, unspecified: Secondary | ICD-10-CM | POA: Insufficient documentation

## 2013-11-17 DIAGNOSIS — R0609 Other forms of dyspnea: Secondary | ICD-10-CM

## 2013-11-17 DIAGNOSIS — R0600 Dyspnea, unspecified: Secondary | ICD-10-CM

## 2013-11-17 DIAGNOSIS — Z5189 Encounter for other specified aftercare: Secondary | ICD-10-CM | POA: Insufficient documentation

## 2013-11-17 NOTE — Progress Notes (Signed)
Patient ID: Susan Barnett, female   DOB: 05-Jan-1946, 68 y.o.   MRN: [id number]    [address], [place]  [address] Phone: [phone] Fax:  [phone]  Date:  11/17/2013   ID:  Susan Barnett, DOB 1946-06-03, MRN [id number]  PCP:  PROVIDER NOT IN SYSTEM   ASSESSMENT:  1. Dyspnea on exertion, chronic and unchanged 2. Coronary artery disease, stable 3. Hypertension, controlled 4. Hyperlipidemia, with LDL of 62 and January  PLAN:  1. Initially we considered increasing intensity of diuretic therapy. The patient please against this plan because of prior difficulty with incontinence. 2. Continue in phase 3 cardiac rehabilitation 3. Notify us if any increase in exertional dyspnea or other complaints   SUBJECTIVE: Susan Barnett is a 68 y.o. female who has dyspnea on exertion. This is unchanged over the past 6-12 months. His been present for at least 5 years. Addition of spironolactone to her current regimen did not improve her dyspnea. She denies orthopnea. There is no lower extremity swelling. No angina. She has not used nitroglycerin. Overall doing well.   Wt Readings from Last 3 Encounters:  11/17/13 162 lb 12.8 oz (73.846 kg)     Past Medical History  Diagnosis Date  . GERD (gastroesophageal reflux disease)   . Hypertension   . CAD (coronary artery disease)   . Gastric polyp   . Hiatal hernia   . Rectal bleeding   . Hypothyroidism   . Diabetes mellitus, type 2   . Heartburn     Current Outpatient Prescriptions  Medication Sig Dispense Refill  . acetaminophen (TYLENOL) 650 MG CR tablet Take 1,300 mg by mouth as needed.      Marland Kitchen amLODipine (NORVASC) 5 MG tablet Take 5 mg by mouth daily.      Marland Kitchen atorvastatin (LIPITOR) 10 MG tablet Take 10 mg by mouth daily.      . carvedilol (COREG) 25 MG tablet Take 25 mg by mouth 2 (two) times daily.      . cetirizine (ZYRTEC) 10 MG tablet Take 10 mg by mouth daily.      . cloNIDine (CATAPRES) 0.1 MG tablet Take  0.1 mg by mouth daily.      . clopidogrel (PLAVIX) 75 MG tablet Take 75 mg by mouth daily.      . Exenatide ER (BYDUREON) 2 MG SUSR Inject 2 mg into the skin once a week.      . levothyroxine (SYNTHROID, LEVOTHROID) 75 MCG tablet Take 75 mcg by mouth every morning.      Marland Kitchen losartan-hydrochlorothiazide (HYZAAR) 100-25 MG per tablet Take 1 tablet by mouth daily.      . nitroGLYCERIN (NITROSTAT) 0.4 MG SL tablet Place 0.4 mg under the tongue every 5 (five) minutes as needed.      Marland Kitchen omeprazole (PRILOSEC) 40 MG capsule Take 40 mg by mouth daily.      . potassium chloride SA (K-DUR,KLOR-CON) 20 MEQ tablet Take 20 mEq by mouth daily.      . sitaGLIPtan-metformin (JANUMET) 50-1000 MG per tablet Take 1 tablet by mouth 2 (two) times daily.      Marland Kitchen spironolactone (ALDACTONE) 25 MG tablet Take 25 mg by mouth daily. Pt is unsure if she is on this medication, she will call us when she gets home (11/17/13)       No current facility-administered medications for this visit.    Allergies:    Allergies  Allergen Reactions  . Asa [Aspirin] Anaphylaxis  .  Compazine [Prochlorperazine Edisylate]   . Nsaids   . Shellfish Allergy   . Sulfa Antibiotics   . Zantac [Ranitidine Hcl]     Social History:  The patient  reports that she has never smoked. She does not have any smokeless tobacco history on file. She reports that she does not drink alcohol or use illicit drugs.   ROS:  Please see the history of present illness.   Dyspnea on exertion continues to be a limiting complaint. No specific association with chest discomfort the   All other systems reviewed and negative.   OBJECTIVE: VS:  BP 129/78  Pulse 87  Ht 5\' 2"  (1.575 m)  Wt 162 lb 12.8 oz (73.846 kg)  BMI 29.77 kg/m2 Well nourished, well developed, in no acute distress, older than stated age 12: normal Neck: JVD flat. Carotid bruit absent  Cardiac:  normal S1, S2; RRR; no murmur Lungs:  clear to auscultation bilaterally, no wheezing, rhonchi or  rales Abd: soft, nontender, no hepatomegaly Ext: Edema absent. Pulses 2+ Skin: warm and dry Neuro:  CNs 2-12 intact, no focal abnormalities noted  EKG:  Normal sinus rhythm with small inferior Q waves unchanged from prior tracings       Signed, Illene Labrador III, MD 11/17/2013 10:11 AM

## 2013-11-17 NOTE — Patient Instructions (Signed)
Your physician recommends that you continue on your current medications as directed. Please refer to the Current Medication list given to you today.  Your physician wants you to follow-up in: 1 year. You will receive a reminder letter in the mail two months in advance. If you don't receive a letter, please call our office to schedule the follow-up appointment.  

## 2013-11-18 ENCOUNTER — Telehealth: Payer: Self-pay | Admitting: Interventional Cardiology

## 2013-11-18 NOTE — Telephone Encounter (Signed)
New message    Saw Dr Tamala Julian yesterday, wanted nurse to know the medication is spironolactone 25mg .  She has not been taking it but they were talking about this medication yesterday and pt could not remember the name.

## 2013-11-18 NOTE — Telephone Encounter (Signed)
Called to let us know that she is not taking Spironolactone.  States she and Dr. Tamala Julian discussed it at office visit yesterday and he did not want her to continue the medication.  She just wanted to let us know that she is not taking that and to change in her medication list.

## 2013-11-19 ENCOUNTER — Encounter (HOSPITAL_COMMUNITY): Payer: Self-pay

## 2013-11-20 ENCOUNTER — Encounter (HOSPITAL_COMMUNITY): Payer: Self-pay

## 2013-11-24 ENCOUNTER — Encounter (HOSPITAL_COMMUNITY): Payer: Self-pay

## 2013-11-26 ENCOUNTER — Encounter (HOSPITAL_COMMUNITY): Payer: Self-pay

## 2013-11-27 ENCOUNTER — Encounter (HOSPITAL_COMMUNITY): Payer: Self-pay

## 2013-12-01 ENCOUNTER — Encounter (HOSPITAL_COMMUNITY): Payer: Self-pay

## 2013-12-03 ENCOUNTER — Encounter (HOSPITAL_COMMUNITY): Payer: Self-pay

## 2013-12-04 ENCOUNTER — Encounter (HOSPITAL_COMMUNITY): Payer: Self-pay

## 2013-12-08 ENCOUNTER — Encounter (HOSPITAL_COMMUNITY): Payer: Self-pay

## 2013-12-10 ENCOUNTER — Encounter (HOSPITAL_COMMUNITY): Payer: Self-pay

## 2013-12-11 ENCOUNTER — Encounter (HOSPITAL_COMMUNITY): Payer: Self-pay

## 2013-12-11 DIAGNOSIS — I10 Essential (primary) hypertension: Secondary | ICD-10-CM | POA: Insufficient documentation

## 2013-12-11 DIAGNOSIS — Z9861 Coronary angioplasty status: Secondary | ICD-10-CM | POA: Insufficient documentation

## 2013-12-11 DIAGNOSIS — Z5189 Encounter for other specified aftercare: Secondary | ICD-10-CM | POA: Insufficient documentation

## 2013-12-11 DIAGNOSIS — E119 Type 2 diabetes mellitus without complications: Secondary | ICD-10-CM | POA: Insufficient documentation

## 2013-12-11 DIAGNOSIS — I2510 Atherosclerotic heart disease of native coronary artery without angina pectoris: Secondary | ICD-10-CM | POA: Insufficient documentation

## 2013-12-15 ENCOUNTER — Encounter (HOSPITAL_COMMUNITY): Payer: Medicare Other

## 2013-12-17 ENCOUNTER — Encounter (HOSPITAL_COMMUNITY): Payer: Self-pay

## 2013-12-18 ENCOUNTER — Encounter (HOSPITAL_COMMUNITY): Payer: Self-pay

## 2013-12-22 ENCOUNTER — Encounter (HOSPITAL_COMMUNITY): Payer: Self-pay

## 2013-12-24 ENCOUNTER — Encounter (HOSPITAL_COMMUNITY): Payer: Self-pay

## 2013-12-25 ENCOUNTER — Encounter (HOSPITAL_COMMUNITY): Payer: Self-pay

## 2013-12-29 ENCOUNTER — Encounter (HOSPITAL_COMMUNITY): Payer: Self-pay

## 2013-12-31 ENCOUNTER — Encounter (HOSPITAL_COMMUNITY): Payer: Self-pay

## 2014-01-01 ENCOUNTER — Encounter (HOSPITAL_COMMUNITY): Payer: Self-pay

## 2014-01-05 ENCOUNTER — Encounter (HOSPITAL_COMMUNITY)
Admission: RE | Admit: 2014-01-05 | Discharge: 2014-01-05 | Disposition: A | Discharge status: Home / Self Care | Payer: Self-pay | Source: Ambulatory Visit | Attending: Interventional Cardiology | Admitting: Interventional Cardiology

## 2014-01-07 ENCOUNTER — Encounter (HOSPITAL_COMMUNITY)
Admission: RE | Admit: 2014-01-07 | Discharge: 2014-01-07 | Disposition: A | Discharge status: Home / Self Care | Payer: Self-pay | Source: Ambulatory Visit | Attending: Interventional Cardiology | Admitting: Interventional Cardiology

## 2014-01-08 ENCOUNTER — Encounter (HOSPITAL_COMMUNITY)
Admission: RE | Admit: 2014-01-08 | Discharge: 2014-01-08 | Disposition: A | Discharge status: Home / Self Care | Payer: Self-pay | Source: Ambulatory Visit | Attending: Interventional Cardiology | Admitting: Interventional Cardiology

## 2014-01-12 ENCOUNTER — Encounter (HOSPITAL_COMMUNITY)
Admission: RE | Admit: 2014-01-12 | Discharge: 2014-01-12 | Disposition: A | Discharge status: Home / Self Care | Payer: Self-pay | Source: Ambulatory Visit | Attending: Interventional Cardiology | Admitting: Interventional Cardiology

## 2014-01-12 DIAGNOSIS — Z9861 Coronary angioplasty status: Secondary | ICD-10-CM | POA: Insufficient documentation

## 2014-01-12 DIAGNOSIS — Z5189 Encounter for other specified aftercare: Secondary | ICD-10-CM | POA: Insufficient documentation

## 2014-01-12 DIAGNOSIS — I10 Essential (primary) hypertension: Secondary | ICD-10-CM | POA: Insufficient documentation

## 2014-01-12 DIAGNOSIS — I2510 Atherosclerotic heart disease of native coronary artery without angina pectoris: Secondary | ICD-10-CM | POA: Insufficient documentation

## 2014-01-12 DIAGNOSIS — E119 Type 2 diabetes mellitus without complications: Secondary | ICD-10-CM | POA: Insufficient documentation

## 2014-01-14 ENCOUNTER — Encounter (HOSPITAL_COMMUNITY)
Admission: RE | Admit: 2014-01-14 | Discharge: 2014-01-14 | Disposition: A | Discharge status: Home / Self Care | Payer: Self-pay | Source: Ambulatory Visit | Attending: Interventional Cardiology | Admitting: Interventional Cardiology

## 2014-01-15 ENCOUNTER — Encounter (HOSPITAL_COMMUNITY)
Admission: RE | Admit: 2014-01-15 | Discharge: 2014-01-15 | Disposition: A | Discharge status: Home / Self Care | Payer: Self-pay | Source: Ambulatory Visit | Attending: Interventional Cardiology | Admitting: Interventional Cardiology

## 2014-01-19 ENCOUNTER — Encounter (HOSPITAL_COMMUNITY): Payer: Self-pay

## 2014-01-21 ENCOUNTER — Encounter (HOSPITAL_COMMUNITY): Payer: Self-pay

## 2014-01-22 ENCOUNTER — Encounter (HOSPITAL_COMMUNITY)
Admission: RE | Admit: 2014-01-22 | Discharge: 2014-01-22 | Disposition: A | Discharge status: Home / Self Care | Payer: Self-pay | Source: Ambulatory Visit | Attending: Interventional Cardiology | Admitting: Interventional Cardiology

## 2014-01-26 ENCOUNTER — Encounter (HOSPITAL_COMMUNITY)
Admission: RE | Admit: 2014-01-26 | Discharge: 2014-01-26 | Disposition: A | Discharge status: Home / Self Care | Payer: Self-pay | Source: Ambulatory Visit | Attending: Interventional Cardiology | Admitting: Interventional Cardiology

## 2014-01-28 ENCOUNTER — Encounter (HOSPITAL_COMMUNITY): Payer: Self-pay

## 2014-01-29 ENCOUNTER — Encounter (HOSPITAL_COMMUNITY): Payer: Self-pay

## 2014-02-02 ENCOUNTER — Encounter (HOSPITAL_COMMUNITY): Payer: Self-pay

## 2014-02-04 ENCOUNTER — Encounter (HOSPITAL_COMMUNITY): Payer: Self-pay

## 2014-02-04 ENCOUNTER — Telehealth: Payer: Self-pay | Admitting: Interventional Cardiology

## 2014-02-04 NOTE — Telephone Encounter (Signed)
New problem    Pt is having knee surgery and need to know if she can come off her Plavix and if so when. Please advise

## 2014-02-04 NOTE — Telephone Encounter (Signed)
pt aware.that I will route to Dr.Smith for approval

## 2014-02-05 ENCOUNTER — Encounter (HOSPITAL_COMMUNITY): Admission: RE | Admit: 2014-02-05 | Payer: Self-pay | Source: Ambulatory Visit

## 2014-02-05 NOTE — Telephone Encounter (Signed)
Cleared for upcoming neurological surgery

## 2014-02-08 NOTE — Telephone Encounter (Signed)
pt adv. Ok to hold Plavix 5 days priort to RIGHT KNEE ARTHROSCOPY per Dr.Smith.pt should follow surgeoan instructions om when it is safe to resume.pt verbalized understanding.

## 2014-02-09 ENCOUNTER — Encounter (HOSPITAL_COMMUNITY): Payer: Self-pay

## 2014-02-09 NOTE — Progress Notes (Signed)
Surgery on 02/24/14.  Preop on 02/19/14 at 0900am.  Need orders in EPIC.  Thank You.

## 2014-02-11 ENCOUNTER — Other Ambulatory Visit: Payer: Self-pay | Admitting: Orthopedic Surgery

## 2014-02-11 ENCOUNTER — Encounter (HOSPITAL_COMMUNITY): Payer: Self-pay | Admitting: Pharmacy Technician

## 2014-02-11 ENCOUNTER — Encounter (HOSPITAL_COMMUNITY): Payer: Self-pay

## 2014-02-16 ENCOUNTER — Encounter (HOSPITAL_COMMUNITY): Payer: Self-pay

## 2014-02-18 ENCOUNTER — Encounter (HOSPITAL_COMMUNITY): Payer: Self-pay

## 2014-02-19 ENCOUNTER — Encounter (HOSPITAL_COMMUNITY): Payer: Self-pay

## 2014-02-19 ENCOUNTER — Encounter (HOSPITAL_COMMUNITY)
Admission: RE | Admit: 2014-02-19 | Discharge: 2014-02-19 | Disposition: A | Discharge status: Home / Self Care | Payer: Medicare Other | Source: Ambulatory Visit | Attending: Orthopedic Surgery | Admitting: Orthopedic Surgery

## 2014-02-19 ENCOUNTER — Encounter (INDEPENDENT_AMBULATORY_CARE_PROVIDER_SITE_OTHER): Payer: Self-pay

## 2014-02-19 ENCOUNTER — Ambulatory Visit (HOSPITAL_COMMUNITY)
Admission: RE | Admit: 2014-02-19 | Discharge: 2014-02-19 | Disposition: A | Discharge status: Home / Self Care | Payer: Medicare Other | Source: Ambulatory Visit | Attending: Anesthesiology | Admitting: Anesthesiology

## 2014-02-19 DIAGNOSIS — Z01812 Encounter for preprocedural laboratory examination: Secondary | ICD-10-CM | POA: Insufficient documentation

## 2014-02-19 DIAGNOSIS — Z01818 Encounter for other preprocedural examination: Secondary | ICD-10-CM | POA: Insufficient documentation

## 2014-02-19 DIAGNOSIS — J984 Other disorders of lung: Secondary | ICD-10-CM | POA: Insufficient documentation

## 2014-02-19 HISTORY — DX: Sleep apnea, unspecified: G47.30

## 2014-02-19 HISTORY — DX: Anemia, unspecified: D64.9

## 2014-02-19 HISTORY — DX: Tremor, unspecified: R25.1

## 2014-02-19 LAB — CBC
HCT: 33.7 % — ABNORMAL LOW (ref 36.0–46.0)
Hemoglobin: 11.1 g/dL — ABNORMAL LOW (ref 12.0–15.0)
MCH: 26.3 pg (ref 26.0–34.0)
MCHC: 32.9 g/dL (ref 30.0–36.0)
MCV: 79.9 fL (ref 78.0–100.0)
Platelets: 365 10*3/uL (ref 150–400)
RBC: 4.22 MIL/uL (ref 3.87–5.11)
RDW: 14.8 % (ref 11.5–15.5)
WBC: 8.5 10*3/uL (ref 4.0–10.5)

## 2014-02-19 LAB — BASIC METABOLIC PANEL
Anion gap: 14 (ref 5–15)
BUN: 12 mg/dL (ref 6–23)
CO2: 24 mEq/L (ref 19–32)
Calcium: 9.6 mg/dL (ref 8.4–10.5)
Chloride: 87 mEq/L — ABNORMAL LOW (ref 96–112)
Creatinine, Ser: 0.64 mg/dL (ref 0.50–1.10)
GFR calc Af Amer: >90 mL/min (ref >90)
GFR calc non Af Amer: 90 mL/min — ABNORMAL LOW (ref >90)
Glucose, Bld: 362 mg/dL — ABNORMAL HIGH (ref 70–99)
Potassium: 3.6 mEq/L — ABNORMAL LOW (ref 3.7–5.3)
Sodium: 125 mEq/L — ABNORMAL LOW (ref 137–147)

## 2014-02-19 NOTE — Progress Notes (Signed)
02-19-14 1610 Note viewable labs in Epic- Sodium 125.

## 2014-02-19 NOTE — Pre-Procedure Instructions (Addendum)
7-10- 15 1610 Labs viewable in [place], note Sodium. Note to Dr. Anne Fu office.

## 2014-02-19 NOTE — Pre-Procedure Instructions (Addendum)
02-19-14 CXR done today. EKG 11-17-13 Epic.

## 2014-02-19 NOTE — Patient Instructions (Addendum)
Susan Barnett  02/19/2014   Your procedure is scheduled on: 7-15  -2015  Enter through [facility] Entrance and follow signs to [facility]. Arrive at        [place].  Call this number if you have problems the morning of surgery: [phone]  Or Presurgical Testing [phone](Susan Barnett) For Living Will and/or Health Care Power Attorney Forms: please provide copy for your medical record,may bring AM of surgery(Forms should be already notarized -we do not provide this service).(02-19-14 No information preferred today).   For Cpap use: Bring mask and tubing only.   Do not eat food:After Midnight.  May have clear liquids:up to 6 Hours before arrival. Nothing after : 0800 AM  Clear liquids include soda, tea, black coffee, apple or grape juice, broth.  Take these medicines the morning of surgery with A SIP OF WATER: Amlodipine. Carvedilol. Cetirizine. Levothyroxine. Omeprazole. Do not take any Diabetic meds AM of.   Do not wear jewelry, make-up or nail polish.  Do not wear lotions, powders, or perfumes. You may wear deodorant.  Do not shave 48 hours(2 days) prior to first CHG shower(legs and under arms).(Shaving face and neck okay.)  Do not bring valuables to the hospital.(Hospital is not responsible for lost valuables).  Contacts, dentures or removable bridgework, body piercing, hair pins may not be worn into surgery.  Leave suitcase in the car. After surgery it may be brought to your room.  For patients admitted to the hospital, checkout time is 11:00 AM the day of discharge.(Restricted visitors-Any Persons displaying flu-like symptoms or illness).    Patients discharged the day of surgery will not be allowed to drive home. Must have responsible person with you x 24 hours once discharged.  Name and phone number of your driver:  Susan Barnett [phone] cell  Special Instructions: CHG(Chlorhedine 4%-"Hibiclens","Betasept","Aplicare") Shower Use Special Wash: see  special instructions.(avoid face and genitals)   Please read over the following fact sheets that you were given: Incentive Spirometry Instruction.    __________________________    [facility] - Preparing for Surgery Before surgery, you can play an important role.  Because skin is not sterile, your skin needs to be as free of germs as possible.  You can reduce the number of germs on your skin by washing with CHG (chlorahexidine gluconate) soap before surgery.  CHG is an antiseptic cleaner which kills germs and bonds with the skin to continue killing germs even after washing. Please DO NOT use if you have an allergy to CHG or antibacterial soaps.  If your skin becomes reddened/irritated stop using the CHG and inform your nurse when you arrive at Short Stay. Do not shave (including legs and underarms) for at least 48 hours prior to the first CHG shower.  You may shave your face/neck. Please follow these instructions carefully:  1.  Shower with CHG Soap the night before surgery and the  morning of Surgery.  2.  If you choose to wash your hair, wash your hair first as usual with your  normal  shampoo.  3.  After you shampoo, rinse your hair and body thoroughly to remove the  shampoo.                           4.  Use CHG as you would any other liquid soap.  You can apply chg directly  to the skin and wash  Gently with a scrungie or clean washcloth.  5.  Apply the CHG Soap to your body ONLY FROM THE NECK DOWN.   Do not use on face/ open                           Wound or open sores. Avoid contact with eyes, ears mouth and genitals (private parts).                       Wash face,  Genitals (private parts) with your normal soap.             6.  Wash thoroughly, paying special attention to the area where your surgery  will be performed.  7.  Thoroughly rinse your body with warm water from the neck down.  8.  DO NOT shower/wash with your normal soap after using and rinsing off  the  CHG Soap.                9.  Pat yourself dry with a clean towel.            10.  Wear clean pajamas.            11.  Place clean sheets on your bed the night of your first shower and do not  sleep with pets. Day of Surgery : Do not apply any lotions/deodorants the morning of surgery.  Please wear clean clothes to the hospital/surgery center.  FAILURE TO FOLLOW THESE INSTRUCTIONS MAY RESULT IN THE CANCELLATION OF YOUR SURGERY PATIENT SIGNATURE_________________________________  NURSE SIGNATURE__________________________________  ________________________________________________________________________   Susan Barnett  An incentive spirometer is a tool that can help keep your lungs clear and active. This tool measures how well you are filling your lungs with each breath. Taking long deep breaths may help reverse or decrease the chance of developing breathing (pulmonary) problems (especially infection) following:  A long period of time when you are unable to move or be active. BEFORE THE PROCEDURE   If the spirometer includes an indicator to show your best effort, your nurse or respiratory therapist will set it to a desired goal.  If possible, sit up straight or lean slightly forward. Try not to slouch.  Hold the incentive spirometer in an upright position. INSTRUCTIONS FOR USE  1. Sit on the edge of your bed if possible, or sit up as far as you can in bed or on a chair. 2. Hold the incentive spirometer in an upright position. 3. Breathe out normally. 4. Place the mouthpiece in your mouth and seal your lips tightly around it. 5. Breathe in slowly and as deeply as possible, raising the piston or the ball toward the top of the column. 6. Hold your breath for 3-5 seconds or for as long as possible. Allow the piston or ball to fall to the bottom of the column. 7. Remove the mouthpiece from your mouth and breathe out normally. 8. Rest for a few seconds and repeat Steps 1 through 7 at  least 10 times every 1-2 hours when you are awake. Take your time and take a few normal breaths between deep breaths. 9. The spirometer may include an indicator to show your best effort. Use the indicator as a goal to work toward during each repetition. 10. After each set of 10 deep breaths, practice coughing to be sure your lungs are clear. If you have an incision (the cut made at the time of  surgery), support your incision when coughing by placing a pillow or rolled up towels firmly against it. Once you are able to get out of bed, walk around indoors and cough well. You may stop using the incentive spirometer when instructed by your caregiver.  RISKS AND COMPLICATIONS  Take your time so you do not get dizzy or light-headed.  If you are in pain, you may need to take or ask for pain medication before doing incentive spirometry. It is harder to take a deep breath if you are having pain. AFTER USE  Rest and breathe slowly and easily.  It can be helpful to keep track of a log of your progress. Your caregiver can provide you with a simple table to help with this. If you are using the spirometer at home, follow these instructions: [place] IF:   You are having difficultly using the spirometer.  You have trouble using the spirometer as often as instructed.  Your pain medication is not giving enough relief while using the spirometer.  You develop fever of 100.5 F (38.1 C) or higher. SEEK IMMEDIATE MEDICAL CARE IF:   You cough up bloody sputum that had not been present before.  You develop fever of 102 F (38.9 C) or greater.  You develop worsening pain at or near the incision site. MAKE SURE YOU:   Understand these instructions.  Will watch your condition.  Will get help right away if you are not doing well or get worse. Document Released: 12/10/2006 Document Revised: 10/22/2011 Document Reviewed: 02/10/2007 [facility] Patient Information 2014 [place],  [place].   ________________________________________________________________________

## 2014-02-23 ENCOUNTER — Encounter (HOSPITAL_COMMUNITY): Payer: Self-pay

## 2014-02-23 DIAGNOSIS — S83289A Other tear of lateral meniscus, current injury, unspecified knee, initial encounter: Secondary | ICD-10-CM

## 2014-02-23 NOTE — H&P (Signed)
CC- Susan Barnett is a 68 y.o. female who presents with right knee pain.  HPI- . Knee Pain: Patient presents with knee pain involving the  right knee. Onset of the symptoms was several months ago. Inciting event: none known. Current symptoms include giving out, pain located laterally, stiffness and swelling. Pain is aggravated by going up and down stairs, rising after sitting and squatting.  Patient has had no prior knee problems. Evaluation to date: MRI: abnormal lateral meniscal tear. Treatment to date: rest.  Past Medical History  Diagnosis Date  . GERD (gastroesophageal reflux disease)   . Hypertension   . Gastric polyp   . Rectal bleeding   . Hypothyroidism   . Diabetes mellitus, type 2   . Heartburn   . Sleep apnea     cpap used nightly x 2 yrs now.  . Occasional tremors     "of head", slight hands  . Hiatal hernia     mild head tremors  . Anemia     mild  . CAD (coronary artery disease)     Dr. Linard Millers follows, no problems now    Past Surgical History  Procedure Laterality Date  . Coronary stents      x3 stents placed-prior ablation  . Cardiac catheterization      last 12-05-04  . Dilation and curettage of uterus    . Elbow fracture surgery Left   . Incontinence surgery      Prior to Admission medications   Medication Sig Start Date End Date Taking? Authorizing Provider  acetaminophen (TYLENOL) 650 MG CR tablet Take 1,300 mg by mouth every 8 (eight) hours as needed for pain.     Historical Provider, MD  amLODipine (NORVASC) 5 MG tablet Take 5 mg by mouth every morning.     Historical Provider, MD  atorvastatin (LIPITOR) 10 MG tablet Take 10 mg by mouth daily.    Historical Provider, MD  carvedilol (COREG) 25 MG tablet Take 25 mg by mouth 2 (two) times daily.    Historical Provider, MD  cetirizine (ZYRTEC) 10 MG tablet Take 10 mg by mouth daily.    Historical Provider, MD  cloNIDine (CATAPRES) 0.1 MG tablet Take 0.1 mg by mouth at bedtime.     Historical Provider,  MD  clopidogrel (PLAVIX) 75 MG tablet Take 75 mg by mouth daily.    Historical Provider, MD  Exenatide ER (BYDUREON) 2 MG SUSR Inject 2 mg into the skin once a week. Sundays    Historical Provider, MD  levothyroxine (SYNTHROID, LEVOTHROID) 75 MCG tablet Take 75 mcg by mouth every morning.    Historical Provider, MD  losartan-hydrochlorothiazide (HYZAAR) 100-25 MG per tablet Take 1 tablet by mouth every morning.     Historical Provider, MD  nitroGLYCERIN (NITROSTAT) 0.4 MG SL tablet Place 0.4 mg under the tongue every 5 (five) minutes as needed for chest pain.     Historical Provider, MD  omeprazole (PRILOSEC) 40 MG capsule Take 40 mg by mouth daily.    Historical Provider, MD  potassium chloride SA (K-DUR,KLOR-CON) 20 MEQ tablet Take 20 mEq by mouth daily.    Historical Provider, MD  sitaGLIPtan-metformin (JANUMET) 50-1000 MG per tablet Take 1 tablet by mouth 2 (two) times daily.    Historical Provider, MD   KNEE EXAM antalgic gait, soft tissue tenderness over lateral joint line, negative drawer sign, collateral ligaments intact  Physical Examination: General appearance - alert, well appearing, and in no distress Mental status - alert, oriented  to person, place, and time Chest - clear to auscultation, no wheezes, rales or rhonchi, symmetric air entry Heart - normal rate, regular rhythm, normal S1, S2, no murmurs, rubs, clicks or gallops Abdomen - soft, nontender, nondistended, no masses or organomegaly Neurological - alert, oriented, normal speech, no focal findings or movement disorder noted   Asessment/Plan--- Rightt knee lateral meniscal tear- - Plan right knee arthroscopy with meniscal debridement. Procedure risks and potential comps discussed with patient who elects to proceed. Goals are decreased pain and increased function with a high likelihood of achieving both

## 2014-02-24 ENCOUNTER — Encounter (HOSPITAL_COMMUNITY)
Admission: RE | Disposition: A | Discharge status: Home / Self Care | Payer: Self-pay | Source: Ambulatory Visit | Attending: Orthopedic Surgery

## 2014-02-24 ENCOUNTER — Encounter (HOSPITAL_COMMUNITY): Payer: Medicare Other | Admitting: Anesthesiology

## 2014-02-24 ENCOUNTER — Ambulatory Visit (HOSPITAL_COMMUNITY)
Admission: RE | Admit: 2014-02-24 | Discharge: 2014-02-24 | Disposition: A | Discharge status: Home / Self Care | Payer: Medicare Other | Source: Ambulatory Visit | Attending: Orthopedic Surgery | Admitting: Orthopedic Surgery

## 2014-02-24 ENCOUNTER — Ambulatory Visit (HOSPITAL_COMMUNITY): Payer: Medicare Other | Admitting: Anesthesiology

## 2014-02-24 ENCOUNTER — Encounter (HOSPITAL_COMMUNITY): Payer: Self-pay | Admitting: *Deleted

## 2014-02-24 DIAGNOSIS — K219 Gastro-esophageal reflux disease without esophagitis: Secondary | ICD-10-CM | POA: Insufficient documentation

## 2014-02-24 DIAGNOSIS — Z9889 Other specified postprocedural states: Secondary | ICD-10-CM

## 2014-02-24 DIAGNOSIS — S83281D Other tear of lateral meniscus, current injury, right knee, subsequent encounter: Secondary | ICD-10-CM

## 2014-02-24 DIAGNOSIS — G4730 Sleep apnea, unspecified: Secondary | ICD-10-CM | POA: Insufficient documentation

## 2014-02-24 DIAGNOSIS — E119 Type 2 diabetes mellitus without complications: Secondary | ICD-10-CM | POA: Insufficient documentation

## 2014-02-24 DIAGNOSIS — D649 Anemia, unspecified: Secondary | ICD-10-CM | POA: Insufficient documentation

## 2014-02-24 DIAGNOSIS — Z794 Long term (current) use of insulin: Secondary | ICD-10-CM | POA: Insufficient documentation

## 2014-02-24 DIAGNOSIS — R112 Nausea with vomiting, unspecified: Secondary | ICD-10-CM

## 2014-02-24 DIAGNOSIS — Z79899 Other long term (current) drug therapy: Secondary | ICD-10-CM | POA: Insufficient documentation

## 2014-02-24 DIAGNOSIS — Z9861 Coronary angioplasty status: Secondary | ICD-10-CM | POA: Insufficient documentation

## 2014-02-24 DIAGNOSIS — S83289A Other tear of lateral meniscus, current injury, unspecified knee, initial encounter: Secondary | ICD-10-CM

## 2014-02-24 DIAGNOSIS — I2510 Atherosclerotic heart disease of native coronary artery without angina pectoris: Secondary | ICD-10-CM | POA: Insufficient documentation

## 2014-02-24 DIAGNOSIS — I10 Essential (primary) hypertension: Secondary | ICD-10-CM | POA: Insufficient documentation

## 2014-02-24 DIAGNOSIS — E039 Hypothyroidism, unspecified: Secondary | ICD-10-CM | POA: Insufficient documentation

## 2014-02-24 DIAGNOSIS — M23349 Other meniscus derangements, anterior horn of lateral meniscus, unspecified knee: Secondary | ICD-10-CM | POA: Insufficient documentation

## 2014-02-24 HISTORY — DX: Other specified postprocedural states: R11.2

## 2014-02-24 HISTORY — DX: Other specified postprocedural states: Z98.890

## 2014-02-24 HISTORY — PX: KNEE ARTHROSCOPY: SHX127

## 2014-02-24 LAB — GLUCOSE, CAPILLARY
Glucose-Capillary: 121 mg/dL — ABNORMAL HIGH (ref 70–99)
Glucose-Capillary: 88 mg/dL (ref 70–99)

## 2014-02-24 SURGERY — ARTHROSCOPY, KNEE
Anesthesia: General | Site: Knee | Laterality: Right

## 2014-02-24 MED ORDER — PROPOFOL 10 MG/ML IV BOLUS
INTRAVENOUS | Status: AC
  Filled 2014-02-24: qty 20

## 2014-02-24 MED ORDER — MIDAZOLAM HCL 2 MG/2ML IJ SOLN
INTRAMUSCULAR | Status: AC
  Filled 2014-02-24: qty 2

## 2014-02-24 MED ORDER — LACTATED RINGERS IV SOLN
INTRAVENOUS | Status: DC | PRN
  Administered 2014-02-24 (×2): via INTRAVENOUS

## 2014-02-24 MED ORDER — EPHEDRINE SULFATE 50 MG/ML IJ SOLN
INTRAMUSCULAR | Status: AC
Start: 2014-02-24 — End: 2014-02-24
  Filled 2014-02-24: qty 1

## 2014-02-24 MED ORDER — CHLORHEXIDINE GLUCONATE 4 % EX LIQD: 60.0000 mL | Freq: Once | CUTANEOUS | Status: DC

## 2014-02-24 MED ORDER — FENTANYL CITRATE 0.05 MG/ML IJ SOLN
INTRAMUSCULAR | Status: AC
  Filled 2014-02-24: qty 2

## 2014-02-24 MED ORDER — ATROPINE SULFATE 0.4 MG/ML IJ SOLN
INTRAMUSCULAR | Status: AC
Start: 2014-02-24 — End: 2014-02-24
  Filled 2014-02-24: qty 1

## 2014-02-24 MED ORDER — ONDANSETRON HCL 4 MG/2ML IJ SOLN
INTRAMUSCULAR | Status: AC
  Filled 2014-02-24: qty 2

## 2014-02-24 MED ORDER — SODIUM CHLORIDE 0.9 % IR SOLN
Status: DC | PRN
  Administered 2014-02-24: 3000 mL

## 2014-02-24 MED ORDER — FENTANYL CITRATE 0.05 MG/ML IJ SOLN
INTRAMUSCULAR | Status: AC
  Filled 2014-02-24: qty 5

## 2014-02-24 MED ORDER — METHOCARBAMOL 500 MG PO TABS: 500.0000 mg | ORAL_TABLET | Freq: Four times a day (QID) | ORAL | Status: DC

## 2014-02-24 MED ORDER — HYDROCODONE-ACETAMINOPHEN 5-325 MG PO TABS: 1.0000 | ORAL_TABLET | Freq: Four times a day (QID) | ORAL | Status: DC | PRN

## 2014-02-24 MED ORDER — BUPIVACAINE-EPINEPHRINE 0.25% -1:200000 IJ SOLN
INTRAMUSCULAR | Status: DC | PRN
  Administered 2014-02-24: 20 mL

## 2014-02-24 MED ORDER — CEFAZOLIN SODIUM-DEXTROSE 2-3 GM-% IV SOLR
2.0000 g | INTRAVENOUS | Status: AC
  Administered 2014-02-24: 2 g via INTRAVENOUS

## 2014-02-24 MED ORDER — PROPOFOL 10 MG/ML IV BOLUS
INTRAVENOUS | Status: DC | PRN
  Administered 2014-02-24: 150 mg via INTRAVENOUS
  Administered 2014-02-24: 50 mg via INTRAVENOUS

## 2014-02-24 MED ORDER — FENTANYL CITRATE 0.05 MG/ML IJ SOLN
25.0000 ug | INTRAMUSCULAR | Status: DC | PRN
  Administered 2014-02-24 (×2): 50 ug via INTRAVENOUS

## 2014-02-24 MED ORDER — ONDANSETRON HCL 4 MG/2ML IJ SOLN
INTRAMUSCULAR | Status: DC | PRN
  Administered 2014-02-24 (×2): 2 mg via INTRAVENOUS

## 2014-02-24 MED ORDER — BUPIVACAINE-EPINEPHRINE (PF) 0.25% -1:200000 IJ SOLN
INTRAMUSCULAR | Status: AC
  Filled 2014-02-24: qty 30

## 2014-02-24 MED ORDER — EPHEDRINE SULFATE 50 MG/ML IJ SOLN
INTRAMUSCULAR | Status: DC | PRN
  Administered 2014-02-24 (×2): 5 mg via INTRAVENOUS

## 2014-02-24 MED ORDER — DEXAMETHASONE SODIUM PHOSPHATE 10 MG/ML IJ SOLN
INTRAMUSCULAR | Status: AC
  Filled 2014-02-24: qty 1

## 2014-02-24 MED ORDER — ACETAMINOPHEN 10 MG/ML IV SOLN
1000.0000 mg | Freq: Once | INTRAVENOUS | Status: AC
  Administered 2014-02-24: 1000 mg via INTRAVENOUS
  Filled 2014-02-24: qty 100

## 2014-02-24 MED ORDER — GLYCOPYRROLATE 0.2 MG/ML IJ SOLN
INTRAMUSCULAR | Status: DC | PRN
  Administered 2014-02-24: .2 mg via INTRAVENOUS

## 2014-02-24 MED ORDER — SODIUM CHLORIDE 0.9 % IV SOLN: INTRAVENOUS | Status: DC

## 2014-02-24 MED ORDER — DEXAMETHASONE SODIUM PHOSPHATE 10 MG/ML IJ SOLN: 10.0000 mg | Freq: Once | INTRAMUSCULAR | Status: DC

## 2014-02-24 MED ORDER — LACTATED RINGERS IV SOLN
INTRAVENOUS | Status: DC
  Administered 2014-02-24: 1000 mL via INTRAVENOUS

## 2014-02-24 MED ORDER — CEFAZOLIN SODIUM-DEXTROSE 2-3 GM-% IV SOLR
INTRAVENOUS | Status: AC
  Filled 2014-02-24: qty 50

## 2014-02-24 MED ORDER — SODIUM CHLORIDE 0.9 % IJ SOLN
INTRAMUSCULAR | Status: AC
  Filled 2014-02-24: qty 10

## 2014-02-24 MED ORDER — LACTATED RINGERS IV SOLN
INTRAVENOUS | Status: DC
  Administered 2014-02-24: 17:00:00 via INTRAVENOUS

## 2014-02-24 MED ORDER — LIDOCAINE HCL (CARDIAC) 20 MG/ML IV SOLN
INTRAVENOUS | Status: DC | PRN
  Administered 2014-02-24: 50 mg via INTRAVENOUS

## 2014-02-24 MED ORDER — FENTANYL CITRATE 0.05 MG/ML IJ SOLN
INTRAMUSCULAR | Status: DC | PRN
  Administered 2014-02-24: 50 ug via INTRAVENOUS

## 2014-02-24 MED ORDER — MIDAZOLAM HCL 5 MG/5ML IJ SOLN
INTRAMUSCULAR | Status: DC | PRN
  Administered 2014-02-24 (×2): .5 mg via INTRAVENOUS

## 2014-02-24 SURGICAL SUPPLY — 26 items
BANDAGE ELASTIC 6 VELCRO ST LF (GAUZE/BANDAGES/DRESSINGS) ×2 IMPLANT
BLADE 4.2CUDA (BLADE) ×2 IMPLANT
CLOTH BEACON ORANGE TIMEOUT ST (SAFETY) ×2 IMPLANT
COUNTER NEEDLE 20 DBL MAG RED (NEEDLE) ×2 IMPLANT
CUFF TOURN SGL QUICK 34 (TOURNIQUET CUFF) ×1
CUFF TRNQT CYL 34X4X40X1 (TOURNIQUET CUFF) ×1 IMPLANT
DRAPE U-SHAPE 47X51 STRL (DRAPES) ×2 IMPLANT
DRSG EMULSION OIL 3X3 NADH (GAUZE/BANDAGES/DRESSINGS) ×2 IMPLANT
DURAPREP 26ML APPLICATOR (WOUND CARE) ×2 IMPLANT
GLOVE BIO SURGEON STRL SZ8 (GLOVE) ×2 IMPLANT
GLOVE BIOGEL PI IND STRL 8 (GLOVE) ×1 IMPLANT
GLOVE BIOGEL PI INDICATOR 8 (GLOVE) ×1
GOWN STRL REUS W/TWL LRG LVL3 (GOWN DISPOSABLE) ×2 IMPLANT
MANIFOLD NEPTUNE II (INSTRUMENTS) ×4 IMPLANT
PACK ARTHROSCOPY WL (CUSTOM PROCEDURE TRAY) ×2 IMPLANT
PACK ICE MAXI GEL EZY WRAP (MISCELLANEOUS) ×6 IMPLANT
PAD ABD 8X10 STRL (GAUZE/BANDAGES/DRESSINGS) ×2 IMPLANT
PADDING CAST COTTON 6X4 STRL (CAST SUPPLIES) ×2 IMPLANT
POSITIONER SURGICAL ARM (MISCELLANEOUS) ×2 IMPLANT
SET ARTHROSCOPY TUBING (MISCELLANEOUS) ×1
SET ARTHROSCOPY TUBING LN (MISCELLANEOUS) ×1 IMPLANT
SPONGE GAUZE 4X4 12PLY (GAUZE/BANDAGES/DRESSINGS) ×2 IMPLANT
SUT ETHILON 4 0 PS 2 18 (SUTURE) ×2 IMPLANT
TOWEL OR 17X26 10 PK STRL BLUE (TOWEL DISPOSABLE) ×2 IMPLANT
WAND 90 DEG TURBOVAC W/CORD (SURGICAL WAND) ×2 IMPLANT
WRAP KNEE MAXI GEL POST OP (GAUZE/BANDAGES/DRESSINGS) ×2 IMPLANT

## 2014-02-24 NOTE — Transfer of Care (Signed)
Immediate Anesthesia Transfer of Care Note  Patient: Susan Barnett  Procedure(s) Performed: Procedure(s): RIGHT KNEE ARTHROSCOPY WITH DEBRIDEMENT  (Right)  Patient Location: PACU  Anesthesia Type:General  Level of Consciousness: awake, oriented, patient cooperative, lethargic and responds to stimulation  Airway & Oxygen Therapy: Patient Spontanous Breathing and Patient connected to face mask oxygen  Post-op Assessment: Report given to PACU RN, Post -op Vital signs reviewed and stable and Patient moving all extremities  Post vital signs: Reviewed and stable  Complications: No apparent anesthesia complications

## 2014-02-24 NOTE — Interval H&P Note (Signed)
History and Physical Interval Note:  02/24/2014 3:14 PM  Susan Barnett  has presented today for surgery, with the diagnosis of RIGHT KNEE LATERAL MENISCAL TEAR   The various methods of treatment have been discussed with the patient and family. After consideration of risks, benefits and other options for treatment, the patient has consented to  Procedure(s): RIGHT KNEE ARTHROSCOPY WITH DEBRIDEMENT  (Right) as a surgical intervention .  The patient's history has been reviewed, patient examined, no change in status, stable for surgery.  I have reviewed the patient's chart and labs.  Questions were answered to the patient's satisfaction.     Gearlean Alf

## 2014-02-24 NOTE — Discharge Instructions (Signed)
Arthroscopic Procedure, Knee An arthroscopic procedure can find what is wrong with your knee. PROCEDURE Arthroscopy is a surgical technique that allows your orthopedic surgeon to diagnose and treat your knee injury with accuracy. They will look into your knee through a small instrument. This is almost like a small (pencil sized) telescope. Because arthroscopy affects your knee less than open knee surgery, you can anticipate a more rapid recovery. Taking an active role by following your caregiver's instructions will help with rapid and complete recovery. Use crutches, rest, elevation, ice, and knee exercises as instructed. The length of recovery depends on various factors including type of injury, age, physical condition, medical conditions, and your rehabilitation. Your knee is the joint between the large bones (femur and tibia) in your leg. Cartilage covers these bone ends which are smooth and slippery and allow your knee to bend and move smoothly. Two menisci, thick, semi-lunar shaped pads of cartilage which form a rim inside the joint, help absorb shock and stabilize your knee. Ligaments bind the bones together and support your knee joint. Muscles move the joint, help support your knee, and take stress off the joint itself. Because of this all programs and physical therapy to rehabilitate an injured or repaired knee require rebuilding and strengthening your muscles. AFTER THE PROCEDURE  After the procedure, you will be moved to a recovery area until most of the effects of the medication have worn off. Your caregiver will discuss the test results with you.   Only take over-the-counter or prescription medicines for pain, discomfort, or fever as directed by your caregiver.  SEEK MEDICAL CARE IF:   You have increased bleeding from your wounds.   You see redness, swelling, or have increasing pain in your wounds.   You have pus coming from your wound.   You have an oral temperature above 102 F (38.9  C).   You notice a bad smell coming from the wound or dressing.   You have severe pain with any motion of your knee.  SEEK IMMEDIATE MEDICAL CARE IF:   You develop a rash.   You have difficulty breathing.   You have any allergic problems.  FURTHER INSTRUCTIONS:  You may start showering two days after being discharged home but do not submerge the incisions under water.   Change dressing 48 hours after the procedure and then cover the small incisions with band aids until your follow up visit.  Avoid periods of inactivity such as sitting longer than an hour when not asleep. This helps prevent blood clots.   You may put full weight on your legs and walk as much as is comfortable.   Do not drive while taking narcotics.  Wear the elastic stockings for three weeks following surgery during the day but you may remove then at night.  Make sure you keep all of your appointments after your operation with all of your doctors and caregivers. You should call the office at [phone] and make an appointment for approximately one week after the date of your surgery.  Please pick up a stool softener and laxative for home use as long as you are requiring pain medications.  Continue to use ice on the knee for pain and swelling from surgery. You may notice swelling that will progress down to the foot and ankle.  This is normal after surgery.  Elevate the leg when you are not up walking on it.   RANGE OF MOTION AND STRENGTHENING EXERCISES  Rehabilitation of the knee is  important following a knee injury or an operation. After just a few days of immobilization, the muscles of the thigh which control the knee become weakened and shrink (atrophy). Knee exercises are designed to build up the tone and strength of the thigh muscles and to improve knee motion. Often times heat used for twenty to thirty minutes before working out will loosen up your tissues and help with improving the range of motion but do not  use heat for the first two weeks following surgery. These exercises can be done on a training (exercise) mat, on the floor, on a table or on a bed. Use what ever works the best and is most comfortable for you Knee exercises include:       QUAD STRENGTHENING EXERCISES Strengthening Quadriceps Sets  Tighten muscles on top of thigh by pushing knees down into floor or table. Hold for 20 seconds. Repeat 10 times. Do 2 sessions per day.    Strengthening Terminal Knee Extension  With knee bent over bolster, straighten knee by tightening muscle on top of thigh. Be sure to keep bottom of knee on bolster. Hold for 20 seconds. Repeat 10 times. Do 2 sessions per day.   Straight Leg with Bent Knee  Lie on back with opposite leg bent. Keep involved knee slightly bent at knee and raise leg 4-6". Hold for 10 seconds. Repeat 20 times per set. Do 2 sets per session. Do 2 sessions per day.

## 2014-02-24 NOTE — Anesthesia Procedure Notes (Signed)
Procedure Name: LMA Insertion Date/Time: 02/24/2014 3:28 PM Performed by: Ofilia Neas Pre-anesthesia Checklist: Patient identified, Timeout performed, Emergency Drugs available, Suction available and Patient being monitored Patient Re-evaluated:Patient Re-evaluated prior to inductionOxygen Delivery Method: Circle system utilized Preoxygenation: Pre-oxygenation with 100% oxygen Intubation Type: IV induction LMA: LMA inserted LMA Size: 4.0 Number of attempts: 2 Tube secured with: Tape Dental Injury: Teeth and Oropharynx as per pre-operative assessment

## 2014-02-24 NOTE — Op Note (Signed)
Preoperative diagnosis-  Right knee lateral meniscal tear  Postoperative diagnosis Right- knee lateral meniscal tear   Procedure- Right knee arthroscopy with lateral meniscal debridement    Surgeon- Dione Plover. Camryn Quesinberry, MD  Anesthesia-General  EBL-  Minimal  Complications- None  Condition- PACU - hemodynamically stable.  Brief clinical note- -Susan Barnett is a 68 y.o.  female with a several month history of right knee pain and mechanical symptoms. Exam and history suggested lateral meniscal tear confirmed by MRI. The patient presents now for arthroscopy and debridement   Procedure in detail -       After successful administration of General anesthetic, a tourmiquet is placed high on the Right  thigh and the Right lower extremity is prepped and draped in the usual sterile fashion. Time out is performed by the surgical team. Standard superomedial and inferolateral portal sites are marked and incisions made with an 11 blade. The inflow cannula is passed through the superomedial portal and camera through the inferolateral portal and inflow is initiated. Arthroscopic visualization proceeds.      The undersurface of the patella and trochlea are visualized and there is mild chondromalacia but no unstable chondral defect. The medial and lateral gutters are visualized and there are no loose bodies. Flexion and valgus force is applied to the knee and the medial compartment is entered. A spinal needle is passed into the joint through the site marked for the inferomedial portal. A small incision is made and the dilator passed into the joint. The findings for the medial compartment are normal .     The intercondylar notch is visualized and the ACL appears normal. The lateral compartment is entered and the findings are unstable tear of anterior horn body and posterior horn of the lateral meniscus . The tear is debrided to a stable base with baskets and a shaver and sealed off with the Arthrocare. It is  probed and found to be stable. There were no unstable cartilage defects and very mild chondromalacia in the lateral compartment.     The joint is again inspected and there are no other tears, defects or loose bodies identified. The arthroscopic equipment is then removed from the inferior portals which are closed with interrupted 4-0 nylon. 20 ml of .25% Marcaine with epinephrine are injected through the inflow cannula and the cannula is then removed and the portal closed with nylon. The incisions are cleaned and dried and a bulky sterile dressing is applied. The patient is then awakened and transported to recovery in stable condition.   02/24/2014, 4:07 PM

## 2014-02-24 NOTE — Anesthesia Preprocedure Evaluation (Addendum)
Anesthesia Evaluation  Patient identified by MRN, date of birth, ID band Patient awake    Reviewed: Allergy & Precautions, H&P , NPO status , Patient's Chart, lab work & pertinent test results  Airway Mallampati: II TM Distance: >3 FB Neck ROM: Full    Dental  (+) Teeth Intact, Dental Advisory Given   Pulmonary sleep apnea and Continuous Positive Airway Pressure Ventilation ,  breath sounds clear to auscultation  Pulmonary exam normal       Cardiovascular hypertension, Pt. on medications and Pt. on home beta blockers + CAD and + Cardiac Stents Rhythm:Regular Rate:Normal     Neuro/Psych  Neuromuscular disease negative neurological ROS  negative psych ROS   GI/Hepatic Neg liver ROS, GERD-  Medicated,  Endo/Other  diabetes, Type 2, Insulin DependentHypothyroidism   Renal/GU negative Renal ROS  negative genitourinary   Musculoskeletal negative musculoskeletal ROS (+)   Abdominal   Peds  Hematology negative hematology ROS (+) anemia ,   Anesthesia Other Findings   Reproductive/Obstetrics                          Anesthesia Physical Anesthesia Plan  ASA: III  Anesthesia Plan: General   Post-op Pain Management:    Induction: Intravenous  Airway Management Planned: LMA  Additional Equipment:   Intra-op Plan:   Post-operative Plan: Extubation in OR  Informed Consent: I have reviewed the patients History and Physical, chart, labs and discussed the procedure including the risks, benefits and alternatives for the proposed anesthesia with the patient or authorized representative who has indicated his/her understanding and acceptance.   Dental advisory given  Plan Discussed with: CRNA  Anesthesia Plan Comments:         Anesthesia Quick Evaluation

## 2014-02-24 NOTE — Anesthesia Postprocedure Evaluation (Signed)
Anesthesia Post Note  Patient: Susan Barnett  Procedure(s) Performed: Procedure(s) (LRB): RIGHT KNEE ARTHROSCOPY WITH DEBRIDEMENT  (Right)  Anesthesia type: General  Patient location: PACU  Post pain: Pain level controlled  Post assessment: Post-op Vital signs reviewed  Last Vitals:  Filed Vitals:   02/24/14 1630  BP: 156/69  Pulse: 71  Temp:   Resp: 11    Post vital signs: Reviewed  Level of consciousness: sedated  Complications: No apparent anesthesia complications

## 2014-02-25 ENCOUNTER — Encounter (HOSPITAL_COMMUNITY): Payer: Self-pay | Admitting: Orthopedic Surgery

## 2014-02-25 ENCOUNTER — Encounter (HOSPITAL_COMMUNITY): Payer: Self-pay

## 2014-02-26 ENCOUNTER — Encounter (HOSPITAL_COMMUNITY): Payer: Self-pay

## 2014-03-02 ENCOUNTER — Encounter (HOSPITAL_COMMUNITY): Payer: Self-pay

## 2014-03-04 ENCOUNTER — Encounter (HOSPITAL_COMMUNITY): Payer: Self-pay

## 2014-03-05 ENCOUNTER — Encounter (HOSPITAL_COMMUNITY): Payer: Self-pay

## 2014-03-09 ENCOUNTER — Encounter (HOSPITAL_COMMUNITY): Payer: Self-pay

## 2014-03-11 ENCOUNTER — Encounter (HOSPITAL_COMMUNITY): Payer: Self-pay

## 2014-03-12 ENCOUNTER — Encounter (HOSPITAL_COMMUNITY): Payer: Self-pay

## 2014-03-16 ENCOUNTER — Encounter (HOSPITAL_COMMUNITY): Payer: Self-pay

## 2014-03-18 ENCOUNTER — Encounter (HOSPITAL_COMMUNITY): Payer: Self-pay

## 2014-03-19 ENCOUNTER — Encounter (HOSPITAL_COMMUNITY): Payer: Self-pay

## 2014-03-23 ENCOUNTER — Encounter (HOSPITAL_COMMUNITY): Payer: Self-pay

## 2014-03-25 ENCOUNTER — Encounter (HOSPITAL_COMMUNITY): Payer: Self-pay

## 2014-03-26 ENCOUNTER — Encounter (HOSPITAL_COMMUNITY): Payer: Self-pay

## 2014-03-30 ENCOUNTER — Encounter (HOSPITAL_COMMUNITY): Payer: Self-pay

## 2014-04-01 ENCOUNTER — Encounter (HOSPITAL_COMMUNITY): Payer: Self-pay

## 2014-04-02 ENCOUNTER — Encounter (HOSPITAL_COMMUNITY): Payer: Self-pay

## 2014-04-06 ENCOUNTER — Encounter (HOSPITAL_COMMUNITY): Payer: Self-pay

## 2014-04-08 ENCOUNTER — Encounter (HOSPITAL_COMMUNITY): Payer: Self-pay

## 2014-04-09 ENCOUNTER — Encounter (HOSPITAL_COMMUNITY): Payer: Self-pay

## 2014-04-13 ENCOUNTER — Encounter (HOSPITAL_COMMUNITY): Payer: Self-pay

## 2014-04-15 ENCOUNTER — Encounter (HOSPITAL_COMMUNITY): Payer: Self-pay

## 2014-04-16 ENCOUNTER — Encounter (HOSPITAL_COMMUNITY): Payer: Self-pay

## 2014-04-20 ENCOUNTER — Encounter (HOSPITAL_COMMUNITY): Payer: Self-pay

## 2014-04-22 ENCOUNTER — Encounter (HOSPITAL_COMMUNITY): Payer: Self-pay

## 2014-04-23 ENCOUNTER — Encounter (HOSPITAL_COMMUNITY): Payer: Self-pay

## 2014-04-27 ENCOUNTER — Encounter (HOSPITAL_COMMUNITY): Payer: Self-pay

## 2014-04-29 ENCOUNTER — Encounter (HOSPITAL_COMMUNITY): Payer: Self-pay

## 2014-04-30 ENCOUNTER — Encounter (HOSPITAL_COMMUNITY): Payer: Self-pay

## 2014-05-04 ENCOUNTER — Encounter (HOSPITAL_COMMUNITY): Payer: Self-pay

## 2014-05-06 ENCOUNTER — Encounter (HOSPITAL_COMMUNITY): Payer: Self-pay

## 2014-05-07 ENCOUNTER — Encounter (HOSPITAL_COMMUNITY): Payer: Self-pay

## 2014-05-11 ENCOUNTER — Encounter (HOSPITAL_COMMUNITY): Payer: Self-pay

## 2014-05-17 ENCOUNTER — Ambulatory Visit: Payer: Medicare Other | Admitting: Interventional Cardiology

## 2014-05-21 ENCOUNTER — Ambulatory Visit (INDEPENDENT_AMBULATORY_CARE_PROVIDER_SITE_OTHER): Payer: Medicare Other | Admitting: Interventional Cardiology

## 2014-05-21 ENCOUNTER — Encounter: Payer: Self-pay | Admitting: Interventional Cardiology

## 2014-05-21 VITALS — BP 132/68 | HR 90 | Ht 62.0 in | Wt 162.4 lb

## 2014-05-21 DIAGNOSIS — E118 Type 2 diabetes mellitus with unspecified complications: Secondary | ICD-10-CM

## 2014-05-21 DIAGNOSIS — I25119 Atherosclerotic heart disease of native coronary artery with unspecified angina pectoris: Secondary | ICD-10-CM

## 2014-05-21 DIAGNOSIS — I10 Essential (primary) hypertension: Secondary | ICD-10-CM

## 2014-05-21 NOTE — Patient Instructions (Addendum)
Your physician recommends that you continue on your current medications as directed. Please refer to the Current Medication list given to you today.  You are scheduled for a cardiac catheterization on 06/02/14 with Dr. Tamala Julian.  Go to [facility] [place] on 06/02/14 at 6:30 am.  Enter thru the [facility] entrance A No food or drink after midnight on 06/01/14 You may take your medications with a sip of water on the day of your procedure.  HOLD YOUR JANUMET MORNING OF PROCEDURE   RETURN NEXT Thursday 05/27/14 FOR PRE CATH LABS   Follow up with Dr Tamala Julian or PA/NP around 06/16/14.

## 2014-05-21 NOTE — Progress Notes (Signed)
Patient ID: Susan Barnett, female   DOB: Jan 02, 1946, 68 y.o.   MRN: [id number]   Date: 05/21/2014 ID: SARAHJANE MATHERLY, DOB [birth year], MRN [id number] PCP: Shirline Frees, MD  Reason: Angina  ASSESSMENT;  1. coronary artery disease with prior coronary stent, now with progressive angina over the last 6 months 2. Hyperlipidemia 3. Hypertension 4. Shellfish allergy  PLAN:  1. Increased nitroglycerin use to control angina 2. Left heart cath with possible PCI within the next 2 weeks. The procedure and risks including stroke, death, myocardial infarction, kidney injury, were discussed with the patient in detail and except as 3. Call if increasing symptoms   SUBJECTIVE: Susan Barnett is a 68 y.o. female who is here for evaluation of increasing angina. She has a long history of coronary artery disease with RCA stents and brachii therapy in the past. Most recent catheterization 2012 was with insertion of a DES for restenosis. Over the past 6 months she has had increasing exertional chest discomfort relieved with rest. Occasional episodes at rest. Nitroglycerin relieved the discomfort. There is mild exertional dyspnea.   Allergies  Allergen Reactions  . Asa [Aspirin] Anaphylaxis  . Compazine [Prochlorperazine Edisylate] Swelling    Tongue swelling   . Nsaids Hives and Swelling  . Shellfish Allergy Hives and Swelling  . Sulfa Antibiotics Hives and Swelling  . Zantac [Ranitidine Hcl] Hives and Swelling    Current Outpatient Prescriptions on File Prior to Visit  Medication Sig Dispense Refill  . amLODipine (NORVASC) 5 MG tablet Take 5 mg by mouth every morning.       Marland Kitchen atorvastatin (LIPITOR) 10 MG tablet Take 10 mg by mouth daily.      . carvedilol (COREG) 25 MG tablet Take 25 mg by mouth 2 (two) times daily.      . cetirizine (ZYRTEC) 10 MG tablet Take 10 mg by mouth daily.      . cloNIDine (CATAPRES) 0.1 MG tablet Take 0.1 mg by mouth at bedtime.       . clopidogrel (PLAVIX) 75 MG  tablet Take 75 mg by mouth daily.      . Exenatide ER (BYDUREON) 2 MG SUSR Inject 2 mg into the skin once a week. Sundays      . levothyroxine (SYNTHROID, LEVOTHROID) 75 MCG tablet Take 75 mcg by mouth every morning.      Marland Kitchen losartan-hydrochlorothiazide (HYZAAR) 100-25 MG per tablet Take 1 tablet by mouth every morning.       . nitroGLYCERIN (NITROSTAT) 0.4 MG SL tablet Place 0.4 mg under the tongue every 5 (five) minutes as needed for chest pain (MA X 3 TABLETS).       Marland Kitchen omeprazole (PRILOSEC) 40 MG capsule Take 40 mg by mouth daily.      . potassium chloride SA (K-DUR,KLOR-CON) 20 MEQ tablet Take 20 mEq by mouth daily.      . sitaGLIPtan-metformin (JANUMET) 50-1000 MG per tablet Take 1 tablet by mouth 2 (two) times daily.       No current facility-administered medications on file prior to visit.    Past Medical History  Diagnosis Date  . Hypertension   . Gastric polyp   . Rectal bleeding   . Hypothyroidism   . Diabetes mellitus, type 2   . Heartburn   . Sleep apnea     cpap used nightly x 2 yrs now.  . Occasional tremors     "of head", slight hands  . Hiatal hernia     mild head  tremors  . Anemia     mild  . CAD (coronary artery disease)     Dr. Linard Millers follows, no problems now  . GERD (gastroesophageal reflux disease)     Past Surgical History  Procedure Laterality Date  . Coronary stents      x3 stents placed-prior ablation  . Cardiac catheterization      last 12-05-04  . Dilation and curettage of uterus    . Elbow fracture surgery Left   . Incontinence surgery    . Knee arthroscopy Right 02/24/2014    Procedure: RIGHT KNEE ARTHROSCOPY WITH DEBRIDEMENT ;  Surgeon: Gearlean Alf, MD;  Location: [facility];  Service: Orthopedics;  Laterality: Right;    History   Social History  . Marital Status: Married    Spouse Name: N/A    Number of Children: N/A  . Years of Education: N/A   Occupational History  . Not on file.   Social History Main Topics  . Smoking status:  Never Smoker   . Smokeless tobacco: Not on file  . Alcohol Use: No  . Drug Use: No  . Sexual Activity: Not Currently   Other Topics Concern  . Not on file   Social History Narrative  . No narrative on file    Family History  Problem Relation Age of Onset  . Heart attack Mother     ROS: No palpitations, syncope, edema, orthopnea, or PND. Other systems negative for complaints.  OBJECTIVE: BP 132/68  Pulse 90  Ht 5\' 2"  (1.575 m)  Wt 162 lb 6.4 oz (73.664 kg)  BMI 29.70 kg/m2,  General: No acute distress, obese and pale HEENT: normal no jaundice or pallor Neck: JVD flat. Carotids absent Chest: Clear Cardiac: Murmur: None. Gallop: S4. Rhythm: Normal. Other: Normal Abdomen: Bruit: Absent. Pulsation: Absent Extremities: Edema: Absent. Pulses: 2+ of the right brachial is slightly diminished Neuro: Normal Psych: Age his  ECG: Not performed

## 2014-05-26 ENCOUNTER — Encounter (HOSPITAL_COMMUNITY): Payer: Self-pay | Admitting: Pharmacy Technician

## 2014-05-27 ENCOUNTER — Other Ambulatory Visit (INDEPENDENT_AMBULATORY_CARE_PROVIDER_SITE_OTHER): Payer: Medicare Other | Admitting: *Deleted

## 2014-05-27 ENCOUNTER — Other Ambulatory Visit: Payer: Self-pay

## 2014-05-27 DIAGNOSIS — I25119 Atherosclerotic heart disease of native coronary artery with unspecified angina pectoris: Secondary | ICD-10-CM

## 2014-05-27 LAB — CBC WITH DIFFERENTIAL/PLATELET
Basophils Absolute: 0 10*3/uL (ref 0.0–0.1)
Basophils Relative: 0.3 % (ref 0.0–3.0)
Eosinophils Absolute: 1.4 10*3/uL — ABNORMAL HIGH (ref 0.0–0.7)
Eosinophils Relative: 12.1 % — ABNORMAL HIGH (ref 0.0–5.0)
HCT: 33.9 % — ABNORMAL LOW (ref 36.0–46.0)
Hemoglobin: 10.8 g/dL — ABNORMAL LOW (ref 12.0–15.0)
Lymphocytes Relative: 16.2 % (ref 12.0–46.0)
Lymphs Abs: 1.9 10*3/uL (ref 0.7–4.0)
MCHC: 31.9 g/dL (ref 30.0–36.0)
MCV: 82.4 fl (ref 78.0–100.0)
Monocytes Absolute: 0.5 10*3/uL (ref 0.1–1.0)
Monocytes Relative: 4.2 % (ref 3.0–12.0)
Neutro Abs: 7.8 10*3/uL — ABNORMAL HIGH (ref 1.4–7.7)
Neutrophils Relative %: 67.2 % (ref 43.0–77.0)
Platelets: 355 10*3/uL (ref 150.0–400.0)
RBC: 4.11 Mil/uL (ref 3.87–5.11)
RDW: 17 % — ABNORMAL HIGH (ref 11.5–15.5)
WBC: 11.7 10*3/uL — ABNORMAL HIGH (ref 4.0–10.5)

## 2014-05-27 LAB — BASIC METABOLIC PANEL
BUN: 12 mg/dL (ref 6–23)
CO2: 30 mEq/L (ref 19–32)
Calcium: 9.1 mg/dL (ref 8.4–10.5)
Chloride: 94 mEq/L — ABNORMAL LOW (ref 96–112)
Creatinine, Ser: 0.7 mg/dL (ref 0.4–1.2)
GFR: 86.7 mL/min (ref >60.00)
Glucose, Bld: 225 mg/dL — ABNORMAL HIGH (ref 70–99)
Potassium: 3.9 mEq/L (ref 3.5–5.1)
Sodium: 129 mEq/L — ABNORMAL LOW (ref 135–145)

## 2014-05-27 LAB — PROTIME-INR
INR: 1.1 ratio — ABNORMAL HIGH (ref 0.8–1.0)
Prothrombin Time: 12.4 s (ref 9.6–13.1)

## 2014-06-02 ENCOUNTER — Encounter (HOSPITAL_COMMUNITY)
Admission: RE | Disposition: A | Discharge status: Home / Self Care | Payer: Medicare Other | Source: Ambulatory Visit | Attending: Interventional Cardiology

## 2014-06-02 ENCOUNTER — Ambulatory Visit (HOSPITAL_COMMUNITY)
Admission: RE | Admit: 2014-06-02 | Discharge: 2014-06-03 | Disposition: A | Discharge status: Home / Self Care | Payer: Medicare Other | Source: Ambulatory Visit | Attending: Interventional Cardiology | Admitting: Interventional Cardiology

## 2014-06-02 ENCOUNTER — Encounter (HOSPITAL_COMMUNITY): Payer: Self-pay | Admitting: General Practice

## 2014-06-02 DIAGNOSIS — E039 Hypothyroidism, unspecified: Secondary | ICD-10-CM | POA: Diagnosis not present

## 2014-06-02 DIAGNOSIS — I25119 Atherosclerotic heart disease of native coronary artery with unspecified angina pectoris: Secondary | ICD-10-CM

## 2014-06-02 DIAGNOSIS — Y831 Surgical operation with implant of artificial internal device as the cause of abnormal reaction of the patient, or of later complication, without mention of misadventure at the time of the procedure: Secondary | ICD-10-CM | POA: Diagnosis not present

## 2014-06-02 DIAGNOSIS — E118 Type 2 diabetes mellitus with unspecified complications: Secondary | ICD-10-CM | POA: Diagnosis not present

## 2014-06-02 DIAGNOSIS — Z955 Presence of coronary angioplasty implant and graft: Secondary | ICD-10-CM | POA: Insufficient documentation

## 2014-06-02 DIAGNOSIS — Z7902 Long term (current) use of antithrombotics/antiplatelets: Secondary | ICD-10-CM | POA: Insufficient documentation

## 2014-06-02 DIAGNOSIS — I200 Unstable angina: Secondary | ICD-10-CM | POA: Diagnosis present

## 2014-06-02 DIAGNOSIS — I2584 Coronary atherosclerosis due to calcified coronary lesion: Secondary | ICD-10-CM | POA: Diagnosis not present

## 2014-06-02 DIAGNOSIS — I25110 Atherosclerotic heart disease of native coronary artery with unstable angina pectoris: Secondary | ICD-10-CM | POA: Insufficient documentation

## 2014-06-02 DIAGNOSIS — T82858A Stenosis of vascular prosthetic devices, implants and grafts, initial encounter: Secondary | ICD-10-CM | POA: Insufficient documentation

## 2014-06-02 DIAGNOSIS — Z9862 Peripheral vascular angioplasty status: Secondary | ICD-10-CM

## 2014-06-02 DIAGNOSIS — K219 Gastro-esophageal reflux disease without esophagitis: Secondary | ICD-10-CM | POA: Insufficient documentation

## 2014-06-02 DIAGNOSIS — Z79899 Other long term (current) drug therapy: Secondary | ICD-10-CM | POA: Insufficient documentation

## 2014-06-02 DIAGNOSIS — I209 Angina pectoris, unspecified: Secondary | ICD-10-CM | POA: Diagnosis present

## 2014-06-02 DIAGNOSIS — I2510 Atherosclerotic heart disease of native coronary artery without angina pectoris: Secondary | ICD-10-CM

## 2014-06-02 DIAGNOSIS — I10 Essential (primary) hypertension: Secondary | ICD-10-CM | POA: Insufficient documentation

## 2014-06-02 HISTORY — DX: Other complications of anesthesia, initial encounter: T88.59XA

## 2014-06-02 HISTORY — PX: CARDIAC CATHETERIZATION: SHX172

## 2014-06-02 HISTORY — PX: LEFT HEART CATHETERIZATION WITH CORONARY ANGIOGRAM: SHX5451

## 2014-06-02 HISTORY — DX: Angina pectoris, unspecified: I20.9

## 2014-06-02 HISTORY — DX: Other specified postprocedural states: Z98.890

## 2014-06-02 HISTORY — DX: Adverse effect of unspecified anesthetic, initial encounter: T41.45XA

## 2014-06-02 HISTORY — DX: Nausea with vomiting, unspecified: R11.2

## 2014-06-02 LAB — POCT ACTIVATED CLOTTING TIME: Activated Clotting Time: 456 seconds

## 2014-06-02 LAB — GLUCOSE, CAPILLARY
Glucose-Capillary: 162 mg/dL — ABNORMAL HIGH (ref 70–99)
Glucose-Capillary: 133 mg/dL — ABNORMAL HIGH (ref 70–99)
Glucose-Capillary: 135 mg/dL — ABNORMAL HIGH (ref 70–99)
Glucose-Capillary: 156 mg/dL — ABNORMAL HIGH (ref 70–99)

## 2014-06-02 SURGERY — LEFT HEART CATHETERIZATION WITH CORONARY ANGIOGRAM
Anesthesia: LOCAL

## 2014-06-02 MED ORDER — LEVOTHYROXINE SODIUM 75 MCG PO TABS
75.0000 ug | ORAL_TABLET | Freq: Every day | ORAL | Status: DC
  Administered 2014-06-03: 75 ug via ORAL
  Filled 2014-06-02 (×3): qty 1

## 2014-06-02 MED ORDER — NITROGLYCERIN 1 MG/10 ML FOR IR/CATH LAB
INTRA_ARTERIAL | Status: AC
  Filled 2014-06-02: qty 10

## 2014-06-02 MED ORDER — SODIUM CHLORIDE 0.9 % IJ SOLN: 3.0000 mL | INTRAMUSCULAR | Status: DC | PRN

## 2014-06-02 MED ORDER — SODIUM CHLORIDE 0.9 % IV SOLN: 250.0000 mL | INTRAVENOUS | Status: DC | PRN

## 2014-06-02 MED ORDER — ACETAMINOPHEN 325 MG PO TABS: 650.0000 mg | ORAL_TABLET | ORAL | Status: DC | PRN

## 2014-06-02 MED ORDER — HEPARIN (PORCINE) IN NACL 2-0.9 UNIT/ML-% IJ SOLN
INTRAMUSCULAR | Status: AC
  Filled 2014-06-02: qty 1500

## 2014-06-02 MED ORDER — ATORVASTATIN CALCIUM 10 MG PO TABS
10.0000 mg | ORAL_TABLET | Freq: Every day | ORAL | Status: DC
  Administered 2014-06-02: 10 mg via ORAL
  Filled 2014-06-02 (×2): qty 1

## 2014-06-02 MED ORDER — TICAGRELOR 90 MG PO TABS
ORAL_TABLET | ORAL | Status: AC
  Filled 2014-06-02: qty 2

## 2014-06-02 MED ORDER — HEPARIN SODIUM (PORCINE) 1000 UNIT/ML IJ SOLN
INTRAMUSCULAR | Status: AC
  Filled 2014-06-02: qty 1

## 2014-06-02 MED ORDER — ONDANSETRON HCL 4 MG/2ML IJ SOLN: 4.0000 mg | Freq: Four times a day (QID) | INTRAMUSCULAR | Status: DC | PRN

## 2014-06-02 MED ORDER — CARVEDILOL 25 MG PO TABS
25.0000 mg | ORAL_TABLET | Freq: Two times a day (BID) | ORAL | Status: DC
  Administered 2014-06-02 – 2014-06-03 (×2): 25 mg via ORAL
  Filled 2014-06-02 (×4): qty 1

## 2014-06-02 MED ORDER — FENTANYL CITRATE 0.05 MG/ML IJ SOLN
INTRAMUSCULAR | Status: AC
  Filled 2014-06-02: qty 2

## 2014-06-02 MED ORDER — PANTOPRAZOLE SODIUM 40 MG PO TBEC
40.0000 mg | DELAYED_RELEASE_TABLET | Freq: Every day | ORAL | Status: DC
Start: 2014-06-03 — End: 2014-06-03
  Administered 2014-06-03: 07:00:00 40 mg via ORAL
  Filled 2014-06-02: qty 1

## 2014-06-02 MED ORDER — CLONIDINE HCL 0.1 MG PO TABS
0.1000 mg | ORAL_TABLET | Freq: Every day | ORAL | Status: DC
  Administered 2014-06-02: 23:00:00 0.1 mg via ORAL
  Filled 2014-06-02 (×2): qty 1

## 2014-06-02 MED ORDER — SODIUM CHLORIDE 0.9 % IV SOLN: INTRAVENOUS | Status: DC

## 2014-06-02 MED ORDER — VERAPAMIL HCL 2.5 MG/ML IV SOLN
INTRAVENOUS | Status: AC
  Filled 2014-06-02: qty 2

## 2014-06-02 MED ORDER — METHYLPREDNISOLONE SODIUM SUCC 125 MG IJ SOLR: 125.0000 mg | INTRAMUSCULAR | Status: DC

## 2014-06-02 MED ORDER — MIDAZOLAM HCL 2 MG/2ML IJ SOLN
INTRAMUSCULAR | Status: AC
  Filled 2014-06-02: qty 2

## 2014-06-02 MED ORDER — SODIUM CHLORIDE 0.9 % IJ SOLN: 3.0000 mL | Freq: Two times a day (BID) | INTRAMUSCULAR | Status: DC

## 2014-06-02 MED ORDER — LIDOCAINE HCL (PF) 1 % IJ SOLN
INTRAMUSCULAR | Status: AC
  Filled 2014-06-02: qty 30

## 2014-06-02 MED ORDER — CLOPIDOGREL BISULFATE 75 MG PO TABS
75.0000 mg | ORAL_TABLET | Freq: Every day | ORAL | Status: DC
  Administered 2014-06-03: 75 mg via ORAL
  Filled 2014-06-02: qty 1

## 2014-06-02 MED ORDER — OXYCODONE-ACETAMINOPHEN 5-325 MG PO TABS: 1.0000 | ORAL_TABLET | ORAL | Status: DC | PRN

## 2014-06-02 MED ORDER — AMLODIPINE BESYLATE 5 MG PO TABS
5.0000 mg | ORAL_TABLET | Freq: Every morning | ORAL | Status: DC
  Administered 2014-06-03: 11:00:00 5 mg via ORAL
  Filled 2014-06-02: qty 1

## 2014-06-02 MED ORDER — SODIUM CHLORIDE 0.9 % IV SOLN
INTRAVENOUS | Status: DC
  Administered 2014-06-02: 1000 mL via INTRAVENOUS

## 2014-06-02 MED ORDER — LORATADINE 10 MG PO TABS
10.0000 mg | ORAL_TABLET | Freq: Every day | ORAL | Status: DC
  Filled 2014-06-02: qty 1

## 2014-06-02 MED ORDER — NITROGLYCERIN 0.4 MG SL SUBL: 0.4000 mg | SUBLINGUAL_TABLET | SUBLINGUAL | Status: DC | PRN

## 2014-06-02 MED ORDER — CARVEDILOL 25 MG PO TABS
25.0000 mg | ORAL_TABLET | Freq: Two times a day (BID) | ORAL | Status: DC
  Filled 2014-06-02: qty 1

## 2014-06-02 MED ORDER — DIPHENHYDRAMINE HCL 50 MG/ML IJ SOLN: 25.0000 mg | INTRAMUSCULAR | Status: AC

## 2014-06-02 MED ORDER — SODIUM CHLORIDE 0.9 % IV SOLN
INTRAVENOUS | Status: AC
  Administered 2014-06-02: 18:00:00 via INTRAVENOUS

## 2014-06-02 MED ORDER — BIVALIRUDIN 250 MG IV SOLR
INTRAVENOUS | Status: AC
  Filled 2014-06-02: qty 250

## 2014-06-02 NOTE — Progress Notes (Signed)
TR BAND REMOVAL  LOCATION:    right radial  DEFLATED PER PROTOCOL:    Yes.    TIME BAND OFF / DRESSING APPLIED:    1500   SITE UPON ARRIVAL:    Level 0  SITE AFTER BAND REMOVAL:    Level 0  REVERSE ALLEN'S TEST:     positive  CIRCULATION SENSATION AND MOVEMENT:    Within Normal Limits   Yes.    COMMENTS:   Rechecked at 1530 with no change in assessment, dressing remains dry and intact

## 2014-06-02 NOTE — CV Procedure (Addendum)
Left Heart Catheterization with Coronary Angiography , PCI, and IVUS Report  Susan Barnett  68 y.o.  female 08/05/1946  Procedure Date: 06/02/2014 Referring Physician: [place] Primary Cardiologist:: HWBSmith  INDICATIONS: Angina pectoris with prior history of  RCA stenting including 4 prior stents (bare-metal stent x1; DES Cypher stents x3) with some regions having 3 layers of overlapping stent. LAD stent procedure was 2006. Last intervention was a scoring balloon angioplasty in 2008 for ISR. Before the drug-eluting stents were placed, the patient had undergone brachytherapy for in-stent restenosis of bare-metal stents in 2002.  PROCEDURE: 1. Left heart catheterization; 2. Coronary angiography; 3. Left ventriculography; 4. PTCA RCA; 5. Post procedure intravascular ultrasound  CONSENT:  The risks, benefits, and details of the procedure were explained in detail to the patient. Risks including death, stroke, heart attack, kidney injury, allergy, limb ischemia, bleeding and radiation injury were discussed.  The patient verbalized understanding and wanted to proceed.  Informed written consent was obtained.  PROCEDURE TECHNIQUE:  After Xylocaine anesthesia a 5 French cath slender sheath was placed in the right radial artery with an angiocath and the modified Seldinger technique.  Coronary angiography was done using a 5 F JR 4 and JL 3.5 cm diagnostic catheter.  Left ventriculography was done using the JR 4 catheter and hand injection.   Angiography demonstrated calcification of the wide patency of the left coronary system. The right coronary contains a mid to distal in-stent segmental ISR with 90-95% obstruction. I discussed the situation with the patient she prefers not to have surgery. Because of her symptoms we decided to embark upon repeat angioplasty of the stented segment. She also was noted to have new obstructive disease distal to the stent margin before the origin of the PDA.  Bivalirudin bolus and infusion was started. A CT was documented to be therapeutic. We attempted to use a this coring balloon, Angiosculpt 3.0 x 10 mm, or unable to cross the stenosis. We then used a 3.0 x 15 mm [place] balloon and dilated to 20 atmospheres. We subsequently upgraded to a 3 5 x 18 mm balloon and dilated up to 18 atmospheres. He still noted had there was deformation of the balloon within the midportion of the stented segment. ST segment elevation and chest discomfort occur with each balloon inflation. I then reconsider the procedure. Post procedure performed intravascular ultrasound and to my surprise we identified circumferential calcification within this persistently obstructed area that had turned to 60 of circumferential calcium. Was also 3 layers with stent in this region the eye this diameter was 1.8 x 1.3 mm in diameter at worst. Angiographically TIMI grade 3 flow was noted. We watched the patient in the cath lab for approximately 15 minutes after the procedure to ensure that I brought closure did not occur. Intravascular ultrasound did not demonstrate any evidence of dissection or thrombus.  The case was terminated and hemostasis was achieved with wrist band.   CONTRAST:  Total of 225 cc.  COMPLICATIONS:  None dilatable mid RCA stent   HEMODYNAMICS:  Aortic pressure 134/68 mmHg; LV pressure 134/3 mmHg; LVEDP 10 mm mercury  ANGIOGRAPHIC DATA:   The left main coronary artery is widely patent.   The left anterior descending artery is .widely patent and terminates at the apex. Proximal 30% narrowing is noted. The first diagonal contains 80% stenosis. It is a small to moderate size vessel..  The left circumflex artery is widely patent giving origin to 3 obtuse marginal branches. The proximal  first marginal is relatively small and contains 75% stenosis..  The right coronary artery is is heavily stented from the mid segment to the distal vessel ending proximal to the origin of the PDA  continuation bifurcation. There is severe segmental in-stent restenosis particularly prominent in the distal region of the stent overlap. The region of in-stent restenosis appears to be greater than 95%. TIMI grade 3 flow was noted.  PCI RESULTS: As noted above the mid to distal RCA region of in-stent restenosis was dilated using noncompliant balloons up to peak pressures of 20 atmospheres with a 2 of 35 mm in diameter balloon. We can never cross the stenosis with a scoring balloon. Intravascular ultrasound was done post procedure and demonstrated circumferential calcium within this region with tightest area of 1.3 x 1.8 mm in diameter no obvious dissection is noted within the region of the stented segment. Post angioplasty TIMI grade 3 flow was noted.  LEFT VENTRICULOGRAM:  Left ventricular angiogram was done in the 30 RAO projection and revealed normal cavity size and EF 65%.   IMPRESSIONS:  1. Recurrent restenosis in the right coronary distal segment which has multiple prior stents with regions of overlap up to 3 stent layers. This vessel is also had brachii therapy prior to drug-eluting stent implantation. The region is not dilatable with conventional balloon therapy and we were unable to advance scoring balloons into this region. Intravascular ultrasound demonstrated 360 of calcium. It would likely respond rotational atherectomy but we will need to complete therapy with additional stents which seems unreasonable at this point. 2. Widely patent left coronary with the exception of diagonal 1 and obtuse marginal 1, both of which contain 70-80% proximal stenoses 3. Normal LV function   RECOMMENDATION:  The patient has been loaded with Brilinta. IV nitroglycerin has been started. In the absence of evidence of dissection or intimal disruption within the stented segment, IV heparin has not been started. He'll convert the patient back to Plavix tomorrow. If symptoms continue I'll refer her for  single-vessel coronary bypass surgery.

## 2014-06-02 NOTE — H&P (View-Only) (Signed)
Patient ID: Susan Barnett, female   DOB: Dec 23, 1945, 68 y.o.   MRN: [id number]   Date: 05/21/2014 ID: Susan Barnett, DOB 04-11-1946, MRN [id number] PCP: Shirline Frees, MD  Reason: Angina  ASSESSMENT;  1. coronary artery disease with prior coronary stent, now with progressive angina over the last 6 months 2. Hyperlipidemia 3. Hypertension 4. Shellfish allergy  PLAN:  1. Increased nitroglycerin use to control angina 2. Left heart cath with possible PCI within the next 2 weeks. The procedure and risks including stroke, death, myocardial infarction, kidney injury, were discussed with the patient in detail and except as 3. Call if increasing symptoms   SUBJECTIVE: Susan Barnett is a 68 y.o. female who is here for evaluation of increasing angina. She has a long history of coronary artery disease with RCA stents and brachii therapy in the past. Most recent catheterization 2012 was with insertion of a DES for restenosis. Over the past 6 months she has had increasing exertional chest discomfort relieved with rest. Occasional episodes at rest. Nitroglycerin relieved the discomfort. There is mild exertional dyspnea.   Allergies  Allergen Reactions  . Asa [Aspirin] Anaphylaxis  . Compazine [Prochlorperazine Edisylate] Swelling    Tongue swelling   . Nsaids Hives and Swelling  . Shellfish Allergy Hives and Swelling  . Sulfa Antibiotics Hives and Swelling  . Zantac [Ranitidine Hcl] Hives and Swelling    Current Outpatient Prescriptions on File Prior to Visit  Medication Sig Dispense Refill  . amLODipine (NORVASC) 5 MG tablet Take 5 mg by mouth every morning.       Marland Kitchen atorvastatin (LIPITOR) 10 MG tablet Take 10 mg by mouth daily.      . carvedilol (COREG) 25 MG tablet Take 25 mg by mouth 2 (two) times daily.      . cetirizine (ZYRTEC) 10 MG tablet Take 10 mg by mouth daily.      . cloNIDine (CATAPRES) 0.1 MG tablet Take 0.1 mg by mouth at bedtime.       . clopidogrel (PLAVIX) 75 MG  tablet Take 75 mg by mouth daily.      . Exenatide ER (BYDUREON) 2 MG SUSR Inject 2 mg into the skin once a week. Sundays      . levothyroxine (SYNTHROID, LEVOTHROID) 75 MCG tablet Take 75 mcg by mouth every morning.      Marland Kitchen losartan-hydrochlorothiazide (HYZAAR) 100-25 MG per tablet Take 1 tablet by mouth every morning.       . nitroGLYCERIN (NITROSTAT) 0.4 MG SL tablet Place 0.4 mg under the tongue every 5 (five) minutes as needed for chest pain (MA X 3 TABLETS).       Marland Kitchen omeprazole (PRILOSEC) 40 MG capsule Take 40 mg by mouth daily.      . potassium chloride SA (K-DUR,KLOR-CON) 20 MEQ tablet Take 20 mEq by mouth daily.      . sitaGLIPtan-metformin (JANUMET) 50-1000 MG per tablet Take 1 tablet by mouth 2 (two) times daily.       No current facility-administered medications on file prior to visit.    Past Medical History  Diagnosis Date  . Hypertension   . Gastric polyp   . Rectal bleeding   . Hypothyroidism   . Diabetes mellitus, type 2   . Heartburn   . Sleep apnea     cpap used nightly x 2 yrs now.  . Occasional tremors     "of head", slight hands  . Hiatal hernia     mild head  tremors  . Anemia     mild  . CAD (coronary artery disease)     Dr. Linard Millers follows, no problems now  . GERD (gastroesophageal reflux disease)     Past Surgical History  Procedure Laterality Date  . Coronary stents      x3 stents placed-prior ablation  . Cardiac catheterization      last 12-05-04  . Dilation and curettage of uterus    . Elbow fracture surgery Left   . Incontinence surgery    . Knee arthroscopy Right 02/24/2014    Procedure: RIGHT KNEE ARTHROSCOPY WITH DEBRIDEMENT ;  Surgeon: Gearlean Alf, MD;  Location: [facility];  Service: Orthopedics;  Laterality: Right;    History   Social History  . Marital Status: Married    Spouse Name: N/A    Number of Children: N/A  . Years of Education: N/A   Occupational History  . Not on file.   Social History Main Topics  . Smoking status:  Never Smoker   . Smokeless tobacco: Not on file  . Alcohol Use: No  . Drug Use: No  . Sexual Activity: Not Currently   Other Topics Concern  . Not on file   Social History Narrative  . No narrative on file    Family History  Problem Relation Age of Onset  . Heart attack Mother     ROS: No palpitations, syncope, edema, orthopnea, or PND. Other systems negative for complaints.  OBJECTIVE: BP 132/68  Pulse 90  Ht 5\' 2"  (1.575 m)  Wt 162 lb 6.4 oz (73.664 kg)  BMI 29.70 kg/m2,  General: No acute distress, obese and pale HEENT: normal no jaundice or pallor Neck: JVD flat. Carotids absent Chest: Clear Cardiac: Murmur: None. Gallop: S4. Rhythm: Normal. Other: Normal Abdomen: Bruit: Absent. Pulsation: Absent Extremities: Edema: Absent. Pulses: 2+ of the right brachial is slightly diminished Neuro: Normal Psych: Age his  ECG: Not performed

## 2014-06-02 NOTE — Care Management Note (Addendum)
  Page 1 of 1   06/02/2014     2:28:59 PM CARE MANAGEMENT NOTE 06/02/2014  Patient:  Susan Barnett, Susan Barnett   Account Number:  [id number]  Date Initiated:  06/02/2014  Documentation initiated by:  Mariann Laster  Subjective/Objective Assessment:   angina pectoris     Action/Plan:   CM to follow for disposiiton needs   Anticipated DC Date:  06/03/2014   Anticipated DC Plan:  HOME/SELF CARE  In-house referral  NA      DC Planning Services  NA      [facility] Choice  NA   Choice offered to / List presented to:  NA           Status of service:  Completed, signed off Medicare Important Message given?   (If response is "NO", the following Medicare IM given date fields will be blank) Date Medicare IM given:   Medicare IM given by:   Date Additional Medicare IM given:   Additional Medicare IM given by:    Discharge Disposition:  HOME/SELF CARE  Per UR Regulation:    If discussed at Long Length of Stay Meetings, dates discussed:    Comments:  Risha Barretta RN, BSN, MSHL, CCM  Nurse - Case Manager,  (Unit [phone]  06/02/2014 Med Review:  Plavix Dispo Plan:  Home Self care.

## 2014-06-02 NOTE — Interval H&P Note (Signed)
History and Physical Interval Note:  06/02/2014 7:03 AM Cath Lab Visit (complete for each Cath Lab visit)  Clinical Evaluation Leading to the Procedure:   ACS: No.  Non-ACS:    Anginal Classification: CCS III  Anti-ischemic medical therapy: Maximal Therapy (2 or more classes of medications)  Non-Invasive Test Results: No non-invasive testing performed  Prior CABG: No previous CABG        Susan Barnett  has presented today for surgery, with the diagnosis of cp  The various methods of treatment have been discussed with the patient and family. After consideration of risks, benefits and other options for treatment, the patient has consented to  Procedure(s): LEFT HEART CATHETERIZATION WITH CORONARY ANGIOGRAM (N/A) as a surgical intervention .  The patient's history has been reviewed, patient examined, no change in status, stable for surgery.  I have reviewed the patient's chart and labs.  Questions were answered to the patient's satisfaction.     Sinclair Grooms

## 2014-06-03 ENCOUNTER — Encounter (HOSPITAL_COMMUNITY): Payer: Self-pay | Admitting: Physician Assistant

## 2014-06-03 DIAGNOSIS — E118 Type 2 diabetes mellitus with unspecified complications: Secondary | ICD-10-CM

## 2014-06-03 DIAGNOSIS — I25110 Atherosclerotic heart disease of native coronary artery with unstable angina pectoris: Secondary | ICD-10-CM | POA: Diagnosis not present

## 2014-06-03 DIAGNOSIS — Z955 Presence of coronary angioplasty implant and graft: Secondary | ICD-10-CM | POA: Diagnosis not present

## 2014-06-03 DIAGNOSIS — I2584 Coronary atherosclerosis due to calcified coronary lesion: Secondary | ICD-10-CM | POA: Diagnosis not present

## 2014-06-03 DIAGNOSIS — T82858A Stenosis of vascular prosthetic devices, implants and grafts, initial encounter: Secondary | ICD-10-CM | POA: Diagnosis not present

## 2014-06-03 DIAGNOSIS — I200 Unstable angina: Secondary | ICD-10-CM

## 2014-06-03 DIAGNOSIS — I25119 Atherosclerotic heart disease of native coronary artery with unspecified angina pectoris: Secondary | ICD-10-CM

## 2014-06-03 LAB — CBC
HCT: 31 % — ABNORMAL LOW (ref 36.0–46.0)
Hemoglobin: 10.4 g/dL — ABNORMAL LOW (ref 12.0–15.0)
MCH: 26.4 pg (ref 26.0–34.0)
MCHC: 33.5 g/dL (ref 30.0–36.0)
MCV: 78.7 fL (ref 78.0–100.0)
Platelets: 333 10*3/uL (ref 150–400)
RBC: 3.94 MIL/uL (ref 3.87–5.11)
RDW: 15.5 % (ref 11.5–15.5)
WBC: 11.7 10*3/uL — ABNORMAL HIGH (ref 4.0–10.5)

## 2014-06-03 LAB — BASIC METABOLIC PANEL
Anion gap: 13 (ref 5–15)
BUN: 8 mg/dL (ref 6–23)
CO2: 24 mEq/L (ref 19–32)
Calcium: 9.1 mg/dL (ref 8.4–10.5)
Chloride: 96 mEq/L (ref 96–112)
Creatinine, Ser: 0.51 mg/dL (ref 0.50–1.10)
GFR calc Af Amer: >90 mL/min (ref >90)
GFR calc non Af Amer: >90 mL/min (ref >90)
Glucose, Bld: 139 mg/dL — ABNORMAL HIGH (ref 70–99)
Potassium: 3.4 mEq/L — ABNORMAL LOW (ref 3.7–5.3)
Sodium: 133 mEq/L — ABNORMAL LOW (ref 137–147)

## 2014-06-03 LAB — GLUCOSE, CAPILLARY: Glucose-Capillary: 156 mg/dL — ABNORMAL HIGH (ref 70–99)

## 2014-06-03 MED ORDER — PANTOPRAZOLE SODIUM 40 MG PO TBEC: 40.0000 mg | DELAYED_RELEASE_TABLET | Freq: Every day | ORAL | Status: DC

## 2014-06-03 MED ORDER — SITAGLIPTIN PHOS-METFORMIN HCL 50-1000 MG PO TABS: 1.0000 | ORAL_TABLET | Freq: Two times a day (BID) | ORAL | Status: DC

## 2014-06-03 MED FILL — Sodium Chloride IV Soln 0.9%: INTRAVENOUS | Qty: 50 | Status: AC

## 2014-06-03 NOTE — Discharge Summary (Signed)
CARDIOLOGY DISCHARGE SUMMARY   Patient ID: Susan Barnett MRN: [id number] DOB/AGE: 69-Feb-1947 69 y.o.  Admit date: 06/02/2014 Discharge date: 06/03/2014  PCP: Shirline Frees, MD Primary Cardiologist: Dr. Tamala Julian  Primary Discharge Diagnosis:  Crescendo angina pectoris Secondary Discharge Diagnosis:  Diabetes, type II with complications  Procedures: 1. Left heart catheterization; 2. Coronary angiography; 3. Left ventriculography; 4. PTCA RCA; 5. Post procedure intravascular ultrasound  Hospital Course: Susan Barnett is a 68 y.o. female with a history of CAD. She has had multiple interventions to her RCA and has 3 layers of stenting in one area of the vessel. She was having angina, increasing in frequency and intensity.  She was seen in the office and scheduled for cardiac catheterization. She came to the hospital for the procedure on 10/21.  Procedure results are below. She had PTCA to the RCA with a subsequent TIMI-3 flow. However, I this showed extensive calcification in the area previously treated with multiple stents, a total of 3 layers. Therefore, the best option is to use medical therapy with outpatient referral for bypass.  On 10/22, she was seen by Dr. Tamala Julian and all data were reviewed. She was seen by cardiac rehabilitation and given instructions on heart-healthy lifestyle modifications as well as how to safely increase her activity. No further inpatient workup is indicated and she is considered stable for discharge, to follow up as an outpatient.  Labs:   Lab Results  Component Value Date   WBC 11.7* 06/03/2014   HGB 10.4* 06/03/2014   HCT 31.0* 06/03/2014   MCV 78.7 06/03/2014   PLT 333 06/03/2014     Recent Labs Lab 06/03/14 0401  NA 133*  K 3.4*  CL 96  CO2 24  BUN 8  CREATININE 0.51  CALCIUM 9.1  GLUCOSE 139*      Cardiac Cath: 06/02/2014 ANGIOGRAPHIC DATA: The left main coronary artery is widely patent.  The left anterior descending artery is  .widely patent and terminates at the apex. Proximal 30% narrowing is noted. The first diagonal contains 80% stenosis. It is a small to moderate size vessel..  The left circumflex artery is widely patent giving origin to 3 obtuse marginal branches. The proximal first marginal is relatively small and contains 75% stenosis..  The right coronary artery is is heavily stented from the mid segment to the distal vessel ending proximal to the origin of the PDA continuation bifurcation. There is severe segmental in-stent restenosis particularly prominent in the distal region of the stent overlap. The region of in-stent restenosis appears to be greater than 95%. TIMI grade 3 flow was noted.  PCI RESULTS: As noted above the mid to distal RCA region of in-stent restenosis was dilated using noncompliant balloons up to peak pressures of 20 atmospheres with a 2 of 35 mm in diameter balloon. We can never cross the stenosis with a scoring balloon. Intravascular ultrasound was done post procedure and demonstrated circumferential calcium within this region with tightest area of 1.3 x 1.8 mm in diameter no obvious dissection is noted within the region of the stented segment. Post angioplasty TIMI grade 3 flow was noted.  LEFT VENTRICULOGRAM: Left ventricular angiogram was done in the 30 RAO projection and revealed normal cavity size and EF 65%.  IMPRESSIONS: 1. Recurrent restenosis in the right coronary distal segment which has multiple prior stents with regions of overlap up to 3 stent layers. This vessel is also had brachii therapy prior to drug-eluting stent implantation. The region is not  dilatable with conventional balloon therapy and we were unable to advance scoring balloons into this region. Intravascular ultrasound demonstrated 360 of calcium. It would likely respond rotational atherectomy but we will need to complete therapy with additional stents which seems unreasonable at this point.  2. Widely patent left coronary  with the exception of diagonal 1 and obtuse marginal 1, both of which contain 70-80% proximal stenoses  3. Normal LV function  RECOMMENDATION: The patient has been loaded with Brilinta. IV nitroglycerin has been started. In the absence of evidence of dissection or intimal disruption within the stented segment, IV heparin has not been started. He'll convert the patient back to Plavix tomorrow. If symptoms continue I'll refer her for single-vessel coronary bypass surgery  EKG: 06/03/2014 Sinus rhythm, no acute ischemic changes Vent. rate 79 BPM PR interval 154 ms QRS duration 88 ms QT/QTc 392/449 ms P-R-T axes 57 51 72  FOLLOW UP PLANS AND APPOINTMENTS Allergies  Allergen Reactions  . Asa [Aspirin] Anaphylaxis  . Compazine [Prochlorperazine Edisylate] Swelling and Other (See Comments)    Tongue swelling   . Nsaids Hives and Swelling  . Shellfish Allergy Hives and Swelling  . Sulfa Antibiotics Hives and Swelling  . Zantac [Ranitidine Hcl] Hives and Swelling     Medication List    STOP taking these medications       omeprazole 40 MG capsule  Commonly known as:  PRILOSEC  Replaced by:  pantoprazole 40 MG tablet      TAKE these medications       amLODipine 5 MG tablet  Commonly known as:  NORVASC  Take 5 mg by mouth every morning.     atorvastatin 10 MG tablet  Commonly known as:  LIPITOR  Take 10 mg by mouth daily.     carvedilol 25 MG tablet  Commonly known as:  COREG  Take 25 mg by mouth 2 (two) times daily.     cetirizine 10 MG tablet  Commonly known as:  ZYRTEC  Take 10 mg by mouth daily.     cloNIDine 0.1 MG tablet  Commonly known as:  CATAPRES  Take 0.1 mg by mouth at bedtime.     clopidogrel 75 MG tablet  Commonly known as:  PLAVIX  Take 75 mg by mouth daily.     Co Q 10 100 MG Caps  Take 300 mg by mouth 2 (two) times daily.     diphenhydramine-acetaminophen 25-500 MG Tabs  Commonly known as:  TYLENOL PM  Take 1 tablet by mouth at bedtime as needed  (for sleep or pain).     Exenatide ER 2 MG Susr  Commonly known as:  BYDUREON  Inject 2 mg into the skin once a week. Sundays     levothyroxine 75 MCG tablet  Commonly known as:  SYNTHROID, LEVOTHROID  Take 75 mcg by mouth every morning.     losartan-hydrochlorothiazide 100-25 MG per tablet  Commonly known as:  HYZAAR  Take 1 tablet by mouth every morning.     MULTIVITAMIN PO  Take 1 tablet by mouth daily.     nitroGLYCERIN 0.4 MG SL tablet  Commonly known as:  NITROSTAT  Place 0.4 mg under the tongue every 5 (five) minutes as needed for chest pain (MA X 3 TABLETS).     pantoprazole 40 MG tablet  Commonly known as:  PROTONIX  Take 1 tablet (40 mg total) by mouth daily before breakfast.     potassium chloride SA 20 MEQ tablet  Commonly  known as:  K-DUR,KLOR-CON  Take 20 mEq by mouth daily.     sitaGLIPtin-metformin 50-1000 MG per tablet  Commonly known as:  JANUMET  Take 1 tablet by mouth 2 (two) times daily. Hold for 48 hours, restart on 06/05/2014        Discharge Instructions   Amb Referral to Cardiac Rehabilitation    Complete by:  As directed      Diet - low sodium heart healthy    Complete by:  As directed      Diet Carb Modified    Complete by:  As directed      Increase activity slowly    Complete by:  As directed           Follow-up Information   Follow up with [facility]. (The office will call with an appointment to be evaluated for surgery)    Contact information:   [address] [place] [address]       Follow up with Sinclair Grooms, MD. (The office will call with a followup appointment.)    Specialty:  Cardiology   Contact information:   [address] [address] [phone]       BRING ALL MEDICATIONS WITH YOU TO FOLLOW UP APPOINTMENTS  Time spent with patient to include physician time: 38 min Signed: Rosaria Ferries, PA-C 06/03/2014, 12:13 PM Co-Sign MD

## 2014-06-03 NOTE — Progress Notes (Signed)
Patient Name: Susan Barnett Date of Encounter: 06/03/2014  Active Problems:   Angina pectoris, crescendo    Patient Profile: 68 yo female w/ hx CAD, admitted 10/21 for scheduled cath 2nd USAP, had PCI RCA.  SUBJECTIVE: No chest pain, no SOB. Willing to start walking and do outpatient rehab.  OBJECTIVE Filed Vitals:   06/02/14 2009 06/02/14 2230 06/03/14 0029 06/03/14 0431  BP: 136/53 159/64 115/42 123/46  Pulse: 86  87 79  Temp: 98.1 F (36.7 C)  98.1 F (36.7 C) 97.8 F (36.6 C)  TempSrc: Axillary  Oral Oral  Resp: 16  18 20   Height:      Weight:   160 lb 15 oz (73 kg)   SpO2: 96%  94% 96%    Intake/Output Summary (Last 24 hours) at 06/03/14 0645 Last data filed at 06/03/14 0400  Gross per 24 hour  Intake 1111.25 ml  Output   2350 ml  Net -1238.75 ml   Filed Weights   06/02/14 0636 06/03/14 0029  Weight: 160 lb (72.576 kg) 160 lb 15 oz (73 kg)    PHYSICAL EXAM General: Well developed, well nourished, female in no acute distress. Head: Normocephalic, atraumatic.  Neck: Supple without bruits, JVD not elevated. Lungs:  Resp regular and unlabored, CTA. Heart: RRR, S1, S2, no S3, S4, or murmur; no rub. Abdomen: Soft, non-tender, non-distended, BS + x 4.  Extremities: No clubbing, cyanosis, no edema. Right radial cath site without ecchymosis or hematoma Neuro: Alert and oriented X 3. Moves all extremities spontaneously. Psych: Normal affect.  LABS: CBC: Recent Labs  06/03/14 0401  WBC 11.7*  HGB 10.4*  HCT 31.0*  MCV 78.7  PLT 824   Basic Metabolic Panel: Recent Labs  06/03/14 0401  NA 133*  K 3.4*  CL 96  CO2 24  GLUCOSE 139*  BUN 8  CREATININE 0.51  CALCIUM 9.1    TELE:   SR   ECG: 06/03/2014 SR, No acute ischemic changes Vent. rate 79 BPM PR interval 154 ms QRS duration 88 ms QT/QTc 392/449 ms P-R-T axes 57 51 72  Current Medications:  . amLODipine  5 mg Oral q morning - 10a  . atorvastatin  10 mg Oral Daily  .  carvedilol  25 mg Oral BID WC  . cloNIDine  0.1 mg Oral QHS  . clopidogrel  75 mg Oral Q breakfast  . levothyroxine  75 mcg Oral QAC breakfast  . loratadine  10 mg Oral Daily  . pantoprazole  40 mg Oral QAC breakfast  . sodium chloride  3 mL Intravenous Q12H      ASSESSMENT AND PLAN: Active Problems:   Angina pectoris, crescendo - s/p cath 10/21 w/ PTCA RCA for ISR, TIMI 3 flow afterwards but there is significant calcium in 3 layers of stenting in this area of the vessel.  IMPRESSIONS: 1. Recurrent restenosis in the right coronary distal segment which has multiple prior stents with regions of overlap up to 3 stent layers. This vessel is also had brachii therapy prior to drug-eluting stent implantation. The region is not dilatable with conventional balloon therapy and we were unable to advance scoring balloons into this region. Intravascular ultrasound demonstrated 360 of calcium. It would likely respond rotational atherectomy but we will need to complete therapy with additional stents which seems unreasonable at this point.  2. Widely patent left coronary with the exception of diagonal 1 and obtuse marginal 1, both of which contain 70-80% proximal stenoses  3. Normal LV function  Continue current medical therapy with beta blocker, ARB and statin. She is allergic to aspirin. Cardiac rehabilitation to see, M.D. of the eyes on adding nitrates.  Plan: Discharge today, hold metformin for 48 hours, followup in the office  Signed, Rosaria Ferries , PA-C 6:45 AM 06/03/2014

## 2014-06-03 NOTE — Progress Notes (Signed)
CARDIAC REHAB PHASE I   PRE:  Rate/Rhythm: 93 SR    BP: sitting 123/44    SaO2:   MODE:  Ambulation: 1000 ft   POST:  Rate/Rhythm: 104 ST    BP: sitting 148/69     SaO2:   Tolerated well without angina. Sts she has had an elevated HR all her life. Ed reviewed/completed. Willing to do CRPII and will send referral to [facility]. Would like to have f/u appt with Dr. Tamala Julian to discuss her case. [phone]   Susan Barnett [place] CES, ACSM 06/03/2014 8:48 AM

## 2014-06-03 NOTE — Discharge Instructions (Signed)
PLEASE REMEMBER TO BRING ALL OF YOUR MEDICATIONS TO EACH OF YOUR FOLLOW-UP OFFICE VISITS. ° °PLEASE ATTEND ALL SCHEDULED FOLLOW-UP APPOINTMENTS.  ° °Activity: Increase activity slowly as tolerated. You may shower, but no soaking baths (or swimming) for 1 week. No driving for 2 days. No lifting over 5 lbs for 1 week. No sexual activity for 1 week.  ° °You May Return to Work: in 1 week (if applicable) ° °Wound Care: You may wash cath site gently with soap and water. Keep cath site clean and dry. If you notice pain, swelling, bleeding or pus at your cath site, please call [phone]. ° ° ° °Cardiac Cath Site Care °Refer to this sheet in the next few weeks. These instructions provide you with information on caring for yourself after your procedure. Your caregiver may also give you more specific instructions. Your treatment has been planned according to current medical practices, but problems sometimes occur. Call your caregiver if you have any problems or questions after your procedure. °HOME CARE INSTRUCTIONS °· You may shower 24 hours after the procedure. Remove the bandage (dressing) and gently wash the site with plain soap and water. Gently pat the site dry.  °· Do not apply powder or lotion to the site.  °· Do not sit in a bathtub, swimming pool, or whirlpool for 5 to 7 days.  °· No bending, squatting, or lifting anything over 10 pounds (4.5 kg) as directed by your caregiver.  °· Inspect the site at least twice daily.  °· Do not drive home if you are discharged the same day of the procedure. Have someone else drive you.  °· You may drive 24 hours after the procedure unless otherwise instructed by your caregiver.  °What to expect: °· Any bruising will usually fade within 1 to 2 weeks.  °· Blood that collects in the tissue (hematoma) may be painful to the touch. It should usually decrease in size and tenderness within 1 to 2 weeks.  °SEEK IMMEDIATE MEDICAL CARE IF: °· You have unusual pain at the site or down the  affected limb.  °· You have redness, warmth, swelling, or pain at the site.  °· You have drainage (other than a small amount of blood on the dressing).  °· You have chills.  °· You have a fever or persistent symptoms for more than 72 hours.  °· You have a fever and your symptoms suddenly get worse.  °· Your leg becomes pale, cool, tingly, or numb.  °· You have heavy bleeding from the site. Hold pressure on the site.  °Document Released: 09/01/2010 Document Revised: 07/19/2011 Document Reviewed: 09/01/2010 °ExitCare® Patient Information ©2012 [employer]. ° °

## 2014-06-04 ENCOUNTER — Other Ambulatory Visit: Payer: Self-pay

## 2014-06-04 ENCOUNTER — Telehealth: Payer: Self-pay

## 2014-06-04 ENCOUNTER — Encounter: Payer: Self-pay | Admitting: Surgery

## 2014-06-04 ENCOUNTER — Institutional Professional Consult (permissible substitution) (INDEPENDENT_AMBULATORY_CARE_PROVIDER_SITE_OTHER): Payer: Medicare Other | Admitting: Surgery

## 2014-06-04 VITALS — BP 115/69 | HR 88 | Resp 20 | Ht 62.0 in | Wt 155.0 lb

## 2014-06-04 DIAGNOSIS — I25119 Atherosclerotic heart disease of native coronary artery with unspecified angina pectoris: Secondary | ICD-10-CM

## 2014-06-04 MED ORDER — LOSARTAN POTASSIUM-HCTZ 100-12.5 MG PO TABS: 1.0000 | ORAL_TABLET | Freq: Every day | ORAL | Status: DC

## 2014-06-04 NOTE — Telephone Encounter (Signed)
Pt aware of lab results and Dr.Smith recommendations.Sodium level is low. Will need to change the Losartan Hctz to 100/12.5 mg daily.rx sent to pt pharmacy. Pt sts that he bypass sx is scheduled with Dr.Bartle on 11/3. Adv pt that I would update Dr.Smith. Pt verbalized understanding.

## 2014-06-04 NOTE — Discharge Summary (Signed)
I had long discussion with the patient concerning the findings and procedure performed yesterday. The right coronary supplies a moderate-sized territory. Intervention lead to severe ST segment elevation and marked symptoms. Our surprise Advair with such significant ischemic potential given the chronicity of the obstruction. My hope was that he has collaterals but there were none recruitable during the procedure.  We talked about treatment options that included conservative medical therapy versus referral for consideration of single vessel bypass (preferably with an arterial conduit). After some discussion we have decided to refer her to TCT S. for consideration of surgery. Intervention on the right coronary is not a good option as she has 3 layers of stent in some segments of the distal right coronary and particularly in the region that has circumferential calcium that was on dilatable. To do rotational atherectomy of an implant on of the stent really makes no sense.  The patient's renal function is stable. Access site is without evidence of complication. Plan discharge today with followup with TCT S.

## 2014-06-04 NOTE — Telephone Encounter (Signed)
Message copied by Lamar Laundry on Fri Jun 04, 2014  5:42 PM ------      Message from: Daneen Schick      Created: Mon May 31, 2014  9:31 AM       Sodium level is low. Will need to change the Losartan Hctz to 100/12.5 mg daily. ------

## 2014-06-04 NOTE — Progress Notes (Signed)
Cardiothoracic Surgery History and Physical   PCP is Shirline Frees, MD Referring Provider is Sinclair Grooms, MD  Chief Complaint  Patient presents with  . Coronary Artery Disease    Surgical eval, cardiac cath 06/02/14    HPI:  The patient is a 68 year old woman with a history of hypertension, hypothyroidism, diabetes and know coronary artery disease who has undergone bare metal stenting of the RCA in 2002 with restenosis treated with brachytherapy. She developed edge restenosis and was treated with a DES and then developed restenosis between the stents treated with angioplasty and stenting with a long DES in 2006. She subsequently developed chest pain and had in-stent restenosis treated with an Angioscore balloon in 2012. She now presents with progressive angina over the last 6 months. Cath shows severe in-stent restenosis in the mid to distal RCA up to 95%. This extends almost to the takeoff of the PDA. A PCI was attempted but the region was not able to be dilated with a conventional balloon and scoring balloons could not be advanced into the region. IVUS demonstrated concentric calcification. There was also a 70-80% stenosis of the proximal small to moderate first diagonal and the proximal small OM1.  Past Medical History  Diagnosis Date  . Hypertension   . Gastric polyp   . Rectal bleeding   . Hypothyroidism   . Heartburn   . Sleep apnea     cpap used nightly x 2 yrs now.  . Occasional tremors     "of head", slight hands  . Hiatal hernia     mild head tremors  . Anemia     mild  . CAD (coronary artery disease)     Dr. Linard Millers follows, no problems now  . GERD (gastroesophageal reflux disease)   . Complication of anesthesia   . PONV (postoperative nausea and vomiting)   . Anginal pain   . Diabetes mellitus, type 2     TYPE 2    Past Surgical History  Procedure Laterality Date  . Coronary stents      x3 stents placed-prior ablation  . Dilation and curettage of  uterus    . Elbow fracture surgery Left   . Incontinence surgery    . Knee arthroscopy Right 02/24/2014    Procedure: RIGHT KNEE ARTHROSCOPY WITH DEBRIDEMENT ;  Surgeon: Gearlean Alf, MD;  Location: [facility];  Service: Orthopedics;  Laterality: Right;  . Cardiac catheterization  06/02/2014    LAD 30%, D1 80%, CFX 755, RCA > 95% ISR, rx w/ PTCA, heavy calcification, EF 65%    Family History  Problem Relation Age of Onset  . Heart attack Mother     Social History History  Substance Use Topics  . Smoking status: Never Smoker   . Smokeless tobacco: Never Used  . Alcohol Use: No    Current Outpatient Prescriptions  Medication Sig Dispense Refill  . amLODipine (NORVASC) 5 MG tablet Take 5 mg by mouth every morning.       Marland Kitchen atorvastatin (LIPITOR) 10 MG tablet Take 10 mg by mouth daily.      . carvedilol (COREG) 25 MG tablet Take 25 mg by mouth 2 (two) times daily.      . cetirizine (ZYRTEC) 10 MG tablet Take 10 mg by mouth daily.      . cloNIDine (CATAPRES) 0.1 MG tablet Take 0.1 mg by mouth at bedtime.       . clopidogrel (PLAVIX) 75 MG tablet Take  75 mg by mouth daily.      . Coenzyme Q10 (CO Q 10) 100 MG CAPS Take 300 mg by mouth 2 (two) times daily.      . diphenhydramine-acetaminophen (TYLENOL PM) 25-500 MG TABS Take 1 tablet by mouth at bedtime as needed (for sleep or pain).       . Exenatide ER (BYDUREON) 2 MG SUSR Inject 2 mg into the skin once a week. Sundays      . levothyroxine (SYNTHROID, LEVOTHROID) 75 MCG tablet Take 75 mcg by mouth every morning.      Marland Kitchen losartan-hydrochlorothiazide (HYZAAR) 100-25 MG per tablet Take 1 tablet by mouth every morning.       . Multiple Vitamins-Minerals (MULTIVITAMIN PO) Take 1 tablet by mouth daily.      . nitroGLYCERIN (NITROSTAT) 0.4 MG SL tablet Place 0.4 mg under the tongue every 5 (five) minutes as needed for chest pain (MA X 3 TABLETS).       . pantoprazole (PROTONIX) 40 MG tablet Take 1 tablet (40 mg total) by mouth daily before  breakfast.  30 tablet  11  . potassium chloride SA (K-DUR,KLOR-CON) 20 MEQ tablet Take 20 mEq by mouth daily.      . sitaGLIPtin-metformin (JANUMET) 50-1000 MG per tablet Take 1 tablet by mouth 2 (two) times daily. Hold for 48 hours, restart on 06/05/2014       No current facility-administered medications for this visit.    Allergies  Allergen Reactions  . Asa [Aspirin] Anaphylaxis  . Compazine [Prochlorperazine Edisylate] Swelling and Other (See Comments)    Tongue swelling   . Nsaids Hives and Swelling  . Shellfish Allergy Hives and Swelling  . Sulfa Antibiotics Hives and Swelling  . Zantac [Ranitidine Hcl] Hives and Swelling    Review of Systems  Constitutional: Positive for fatigue. Negative for activity change, appetite change and unexpected weight change.  Eyes:       Blurry vision and floaters  Respiratory: Positive for shortness of breath.        Uses CPAP at night  Cardiovascular: Positive for chest pain. Negative for leg swelling.  Gastrointestinal:       Reflux   Endocrine: Negative.   Genitourinary: Negative.   Musculoskeletal: Positive for arthralgias.  Neurological: Positive for numbness and headaches.       Hands and feet  Hematological: Negative.   Psychiatric/Behavioral: Negative.     BP 115/69  Pulse 88  Resp 20  Ht 5\' 2"  (1.575 m)  Wt 155 lb (70.308 kg)  BMI 28.34 kg/m2  SpO2 98% Physical Exam  Constitutional: She is oriented to person, place, and time. She appears well-developed and well-nourished. No distress.  HENT:  Head: Normocephalic and atraumatic.  Mouth/Throat: Oropharynx is clear and moist.  Eyes: EOM are normal. Pupils are equal, round, and reactive to light.  Neck: Normal range of motion. Neck supple. No JVD present. No thyromegaly present.  Cardiovascular: Normal rate, regular rhythm and intact distal pulses.   No murmur heard. Pulmonary/Chest: Effort normal and breath sounds normal. No respiratory distress. She has no rales.    Abdominal: Soft. Bowel sounds are normal. She exhibits no distension and no mass. There is no tenderness.  Musculoskeletal: She exhibits edema.  Lymphadenopathy:    She has no cervical adenopathy.  Neurological: She is alert and oriented to person, place, and time. She has normal strength. No cranial nerve deficit or sensory deficit.  Skin: Skin is warm and dry.  Psychiatric: She has a  normal mood and affect.    Diagnostic Tests:  Left Heart Catheterization with Coronary Angiography , PCI, and IVUS Report  LINDSEE LABARRE  68 y.o.  female  1946/02/11  Procedure Date: 06/02/2014  Referring Physician: [place]  Primary Cardiologist:: HWBSmith  INDICATIONS: Angina pectoris with prior history of RCA stenting including 4 prior stents (bare-metal stent x1; DES Cypher stents x3) with some regions having 3 layers of overlapping stent. LAD stent procedure was 2006. Last intervention was a scoring balloon angioplasty in 2008 for ISR. Before the drug-eluting stents were placed, the patient had undergone brachytherapy for in-stent restenosis of bare-metal stents in 2002.  PROCEDURE: 1. Left heart catheterization; 2. Coronary angiography; 3. Left ventriculography; 4. PTCA RCA; 5. Post procedure intravascular ultrasound  CONSENT:  The risks, benefits, and details of the procedure were explained in detail to the patient. Risks including death, stroke, heart attack, kidney injury, allergy, limb ischemia, bleeding and radiation injury were discussed. The patient verbalized understanding and wanted to proceed. Informed written consent was obtained.  PROCEDURE TECHNIQUE: After Xylocaine anesthesia a 5 French cath slender sheath was placed in the right radial artery with an angiocath and the modified Seldinger technique. Coronary angiography was done using a 5 F JR 4 and JL 3.5 cm diagnostic catheter. Left ventriculography was done using the JR 4 catheter and hand injection.  Angiography demonstrated  calcification of the wide patency of the left coronary system. The right coronary contains a mid to distal in-stent segmental ISR with 90-95% obstruction. I discussed the situation with the patient she prefers not to have surgery. Because of her symptoms we decided to embark upon repeat angioplasty of the stented segment. She also was noted to have new obstructive disease distal to the stent margin before the origin of the PDA. Bivalirudin bolus and infusion was started. A CT was documented to be therapeutic. We attempted to use a this coring balloon, Angiosculpt 3.0 x 10 mm, or unable to cross the stenosis. We then used a 3.0 x 15 mm [place] balloon and dilated to 20 atmospheres. We subsequently upgraded to a 3 5 x 18 mm balloon and dilated up to 18 atmospheres. He still noted had there was deformation of the balloon within the midportion of the stented segment. ST segment elevation and chest discomfort occur with each balloon inflation. I then reconsider the procedure. Post procedure performed intravascular ultrasound and to my surprise we identified circumferential calcification within this persistently obstructed area that had turned to 60 of circumferential calcium. Was also 3 layers with stent in this region the eye this diameter was 1.8 x 1.3 mm in diameter at worst. Angiographically TIMI grade 3 flow was noted. We watched the patient in the cath lab for approximately 15 minutes after the procedure to ensure that I brought closure did not occur. Intravascular ultrasound did not demonstrate any evidence of dissection or thrombus.  The case was terminated and hemostasis was achieved with wrist band.  CONTRAST: Total of 225 cc.  COMPLICATIONS: None dilatable mid RCA stent  HEMODYNAMICS: Aortic pressure 134/68 mmHg; LV pressure 134/3 mmHg; LVEDP 10 mm mercury  ANGIOGRAPHIC DATA: The left main coronary artery is widely patent.  The left anterior descending artery is .widely patent and terminates at the apex.  Proximal 30% narrowing is noted. The first diagonal contains 80% stenosis. It is a small to moderate size vessel..  The left circumflex artery is widely patent giving origin to 3 obtuse marginal branches. The proximal first marginal is relatively  small and contains 75% stenosis..  The right coronary artery is is heavily stented from the mid segment to the distal vessel ending proximal to the origin of the PDA continuation bifurcation. There is severe segmental in-stent restenosis particularly prominent in the distal region of the stent overlap. The region of in-stent restenosis appears to be greater than 95%. TIMI grade 3 flow was noted.  PCI RESULTS: As noted above the mid to distal RCA region of in-stent restenosis was dilated using noncompliant balloons up to peak pressures of 20 atmospheres with a 2 of 35 mm in diameter balloon. We can never cross the stenosis with a scoring balloon. Intravascular ultrasound was done post procedure and demonstrated circumferential calcium within this region with tightest area of 1.3 x 1.8 mm in diameter no obvious dissection is noted within the region of the stented segment. Post angioplasty TIMI grade 3 flow was noted.  LEFT VENTRICULOGRAM: Left ventricular angiogram was done in the 30 RAO projection and revealed normal cavity size and EF 65%.  IMPRESSIONS: 1. Recurrent restenosis in the right coronary distal segment which has multiple prior stents with regions of overlap up to 3 stent layers. This vessel is also had brachii therapy prior to drug-eluting stent implantation. The region is not dilatable with conventional balloon therapy and we were unable to advance scoring balloons into this region. Intravascular ultrasound demonstrated 360 of calcium. It would likely respond rotational atherectomy but we will need to complete therapy with additional stents which seems unreasonable at this point.  2. Widely patent left coronary with the exception of diagonal 1 and obtuse  marginal 1, both of which contain 70-80% proximal stenoses  3. Normal LV function  RECOMMENDATION: The patient has been loaded with Brilinta. IV nitroglycerin has been started. In the absence of evidence of dissection or intimal disruption within the stented segment, IV heparin has not been started. He'll convert the patient back to Plavix tomorrow. If symptoms continue I'll refer her for single-vessel coronary bypass surgery.    Impression:  She has severe high grade restenosis in the stented mid to distal RCA with the PDA and PL at risk. This is not amenable to PCI. The diagonal is graftable but the OM1 is probably too small to have a reliable graft placed. She has diffusely irregular vessels and I am sure she will have diffuse plaque given the appearance of her vessels. I think CABG is the best treatment for her to prevent further ischemia and infarction and to improve her quality of life which she says has decreased because she can't do much without getting chest pain. I discussed the operative procedure with the patient and her husband including alternatives, benefits and risks; including but not limited to bleeding, blood transfusion, infection, stroke, myocardial infarction, graft failure, heart block requiring a permanent pacemaker, organ dysfunction, and death.  [name] understands and agrees to proceed.     Plan:  She will look at her schedule and call to schedule surgery.

## 2014-06-04 NOTE — Telephone Encounter (Signed)
Message copied by Lamar Laundry on Fri Jun 04, 2014  5:36 PM ------      Message from: Daneen Schick      Created: Mon May 31, 2014  9:31 AM       Sodium level is low. Will need to change the Losartan Hctz to 100/12.5 mg daily. ------

## 2014-06-07 ENCOUNTER — Encounter (HOSPITAL_COMMUNITY): Payer: Self-pay | Admitting: Pharmacy Technician

## 2014-06-10 ENCOUNTER — Telehealth: Payer: Self-pay | Admitting: Interventional Cardiology

## 2014-06-10 NOTE — Telephone Encounter (Signed)
New message     Pt had a cath 10-21 and is scheduled for bypass surgery on Monday.  She is having vision problems for about 30 minutes---she says flashing light/clustered together episodes.  She had several during the cath and have been having them since the cath.  Please advise

## 2014-06-11 ENCOUNTER — Ambulatory Visit (HOSPITAL_COMMUNITY)
Admission: RE | Admit: 2014-06-11 | Discharge: 2014-06-11 | Disposition: A | Discharge status: Home / Self Care | Payer: Medicare Other | Source: Ambulatory Visit | Attending: Surgery | Admitting: Surgery

## 2014-06-11 ENCOUNTER — Encounter (HOSPITAL_COMMUNITY): Payer: Self-pay

## 2014-06-11 ENCOUNTER — Encounter (HOSPITAL_COMMUNITY)
Admission: RE | Admit: 2014-06-11 | Discharge: 2014-06-11 | Disposition: A | Discharge status: Home / Self Care | Payer: Medicare Other | Source: Ambulatory Visit | Attending: Surgery | Admitting: Surgery

## 2014-06-11 VITALS — BP 138/64 | HR 98 | Temp 98.5°F | Resp 20 | Ht 62.0 in | Wt 158.3 lb

## 2014-06-11 DIAGNOSIS — Z01818 Encounter for other preprocedural examination: Secondary | ICD-10-CM | POA: Insufficient documentation

## 2014-06-11 DIAGNOSIS — I25119 Atherosclerotic heart disease of native coronary artery with unspecified angina pectoris: Secondary | ICD-10-CM

## 2014-06-11 DIAGNOSIS — I2510 Atherosclerotic heart disease of native coronary artery without angina pectoris: Secondary | ICD-10-CM | POA: Insufficient documentation

## 2014-06-11 DIAGNOSIS — Z01810 Encounter for preprocedural cardiovascular examination: Secondary | ICD-10-CM

## 2014-06-11 HISTORY — DX: Headache: R51

## 2014-06-11 HISTORY — DX: Unspecified osteoarthritis, unspecified site: M19.90

## 2014-06-11 HISTORY — DX: Shortness of breath: R06.02

## 2014-06-11 HISTORY — DX: Cardiac arrhythmia, unspecified: I49.9

## 2014-06-11 HISTORY — DX: Anxiety disorder, unspecified: F41.9

## 2014-06-11 HISTORY — DX: Headache, unspecified: R51.9

## 2014-06-11 HISTORY — DX: Pneumonia, unspecified organism: J18.9

## 2014-06-11 LAB — CBC
HCT: 33.7 % — ABNORMAL LOW (ref 36.0–46.0)
Hemoglobin: 11.1 g/dL — ABNORMAL LOW (ref 12.0–15.0)
MCH: 26.6 pg (ref 26.0–34.0)
MCHC: 32.9 g/dL (ref 30.0–36.0)
MCV: 80.6 fL (ref 78.0–100.0)
Platelets: 396 10*3/uL (ref 150–400)
RBC: 4.18 MIL/uL (ref 3.87–5.11)
RDW: 15.6 % — ABNORMAL HIGH (ref 11.5–15.5)
WBC: 11.1 10*3/uL — ABNORMAL HIGH (ref 4.0–10.5)

## 2014-06-11 LAB — PULMONARY FUNCTION TEST
DL/VA % pred: 105 %
DL/VA: 4.78 ml/min/mmHg/L
DLCO cor % pred: 73 %
DLCO cor: 15.9 ml/min/mmHg
DLCO unc % pred: 73 %
DLCO unc: 15.9 ml/min/mmHg
FEF 25-75 Post: 2.08 L/sec
FEF 25-75 Pre: 1.69 L/sec
FEF2575-%Change-Post: 23 %
FEF2575-%Pred-Post: 114 %
FEF2575-%Pred-Pre: 92 %
FEV1-%Change-Post: 7 %
FEV1-%Pred-Post: 88 %
FEV1-%Pred-Pre: 82 %
FEV1-Post: 1.85 L
FEV1-Pre: 1.73 L
FEV1FVC-%Change-Post: 0 %
FEV1FVC-%Pred-Pre: 102 %
FEV6-%Change-Post: 6 %
FEV6-%Pred-Post: 88 %
FEV6-%Pred-Pre: 82 %
FEV6-Post: 2.36 L
FEV6-Pre: 2.21 L
FEV6FVC-%Change-Post: 0 %
FEV6FVC-%Pred-Post: 103 %
FEV6FVC-%Pred-Pre: 104 %
FVC-%Change-Post: 6 %
FVC-%Pred-Post: 84 %
FVC-%Pred-Pre: 79 %
FVC-Post: 2.36 L
FVC-Pre: 2.21 L
Post FEV1/FVC ratio: 79 %
Post FEV6/FVC ratio: 100 %
Pre FEV1/FVC ratio: 78 %
Pre FEV6/FVC Ratio: 100 %
RV % pred: 100 %
RV: 2.07 L
TLC % pred: 93 %
TLC: 4.43 L

## 2014-06-11 LAB — APTT: aPTT: 35 seconds (ref 24–37)

## 2014-06-11 LAB — COMPREHENSIVE METABOLIC PANEL
ALT: 15 U/L (ref 0–35)
AST: 18 U/L (ref 0–37)
Albumin: 4 g/dL (ref 3.5–5.2)
Alkaline Phosphatase: 65 U/L (ref 39–117)
Anion gap: 17 — ABNORMAL HIGH (ref 5–15)
BUN: 12 mg/dL (ref 6–23)
CO2: 20 mEq/L (ref 19–32)
Calcium: 9.6 mg/dL (ref 8.4–10.5)
Chloride: 97 mEq/L (ref 96–112)
Creatinine, Ser: 0.49 mg/dL — ABNORMAL LOW (ref 0.50–1.10)
GFR calc Af Amer: >90 mL/min (ref >90)
GFR calc non Af Amer: >90 mL/min (ref >90)
Glucose, Bld: 110 mg/dL — ABNORMAL HIGH (ref 70–99)
Potassium: 4.3 mEq/L (ref 3.7–5.3)
Sodium: 134 mEq/L — ABNORMAL LOW (ref 137–147)
Total Bilirubin: 0.2 mg/dL — ABNORMAL LOW (ref 0.3–1.2)
Total Protein: 7.5 g/dL (ref 6.0–8.3)

## 2014-06-11 LAB — URINALYSIS, ROUTINE W REFLEX MICROSCOPIC
Bilirubin Urine: NEGATIVE
Glucose, UA: NEGATIVE mg/dL
Hgb urine dipstick: NEGATIVE
Ketones, ur: NEGATIVE mg/dL
Leukocytes, UA: NEGATIVE
Nitrite: NEGATIVE
Protein, ur: NEGATIVE mg/dL
Specific Gravity, Urine: 1.008 (ref 1.005–1.030)
Urobilinogen, UA: 0.2 mg/dL (ref 0.0–1.0)
pH: 6 (ref 5.0–8.0)

## 2014-06-11 LAB — BLOOD GAS, ARTERIAL
Acid-base deficit: 0.1 mmol/L (ref 0.0–2.0)
Bicarbonate: 22.6 mEq/L (ref 20.0–24.0)
Drawn by: 42180
FIO2: 0.21 %
O2 Saturation: 98.5 %
Patient temperature: 98.6
TCO2: 23.4 mmol/L (ref 0–100)
pCO2 arterial: 28.2 mmHg — ABNORMAL LOW (ref 35.0–45.0)
pH, Arterial: 7.514 — ABNORMAL HIGH (ref 7.350–7.450)
pO2, Arterial: 116 mmHg — ABNORMAL HIGH (ref 80.0–100.0)

## 2014-06-11 LAB — PROTIME-INR
INR: 1.2 (ref 0.00–1.49)
Prothrombin Time: 15.4 seconds — ABNORMAL HIGH (ref 11.6–15.2)

## 2014-06-11 LAB — SURGICAL PCR SCREEN
MRSA, PCR: NEGATIVE
Staphylococcus aureus: NEGATIVE

## 2014-06-11 LAB — ABO/RH: ABO/RH(D): A POS

## 2014-06-11 MED ORDER — ALBUTEROL SULFATE (2.5 MG/3ML) 0.083% IN NEBU
2.5000 mg | INHALATION_SOLUTION | Freq: Once | RESPIRATORY_TRACT | Status: AC
  Administered 2014-06-11: 2.5 mg via RESPIRATORY_TRACT

## 2014-06-11 NOTE — Telephone Encounter (Signed)
Pt given Dr.Ross recommendation.  Aura episodes are probably not related to her cardiac cath. If there was an event i.e. Tia/or Cva. We would think that pt visual disturbance would be constant.pt adv that her pre op nurse has fwd Dr.Bartle an update. Dr.Ross thinks it is ok to proceed with Sx. Pt adv that the decision it up to Dr.Bartle.pt verbalized understanding.

## 2014-06-11 NOTE — Telephone Encounter (Signed)
returned pt call. pt was not home. pt husband sts that pt is having her preop exam.he sts that pt has been having "aura's" like the ones associated with migraines. Pt has had 3 episodes since her cardiac cath with no migraine associated.pt has no other complaints.adv pt husband that would not prevent pt from proceeding with her bypass sx on 11/2.adv him that Dr.Smith is not in the office.I will talk with another a physician and call back with their recommendations.pt husband agreeable.

## 2014-06-11 NOTE — Progress Notes (Signed)
VASCULAR LAB PRELIMINARY  PRELIMINARY  PRELIMINARY  PRELIMINARY  Pre-op Cardiac Surgery  Carotid Findings:  Bilateral:  1-39% ICA stenosis.  Vertebral artery flow is antegrade.     Upper Extremity Right Left  Brachial Pressures 128 Triphasic 118 Triphasic  Radial Waveforms Triphasic Triphasic  Ulnar Waveforms Biphasic Biphasic  Palmar Arch (Allen's Test) Normal Normal   Findings:  Doppler waveforms remained normal bilaterally with both radial and ulnar compmpressions    Lower  Extremity Right Left  Dorsalis Pedis 148 Biphasic 128 Biphasic  Posterior Tibial 132 Biphasic 144 Biphasic  Ankle/Brachial Indices 1.16 1.13    Findings:  ABIs and Doppler waveforms are within normal limits bilaterally at rest.   Susan Barnett, RVS 06/11/2014, 1:17 PM

## 2014-06-11 NOTE — Progress Notes (Signed)
Mrs Cypert stated that she was concerned about something that happened while she was having the cardiac cath and has happened since the cardiac.  "I had aurora like I did when I had migraine head ache more than 15 years ago."  " I am not having head aches, except a small one."  "I have tried to get in touch with Dr Tamala Julian, but no one has called me back."  Patient reported having 2 on Thursday, October 29, " they last about 1/2 hour."  "I just want someone to know."  I called Dr Vivi Martens office and spoke with Levonne Spiller, PA and gave her the information.

## 2014-06-11 NOTE — Telephone Encounter (Signed)
Follow up      Talk to a nurse---still having vision problems.  Could it be from the cath.  Please call before 11:30 or after 3:30.  Pt is having bypass surgery Monday and want to talk talk to a nurse

## 2014-06-11 NOTE — Pre-Procedure Instructions (Addendum)
Susan Barnett  06/11/2014   Your procedure is scheduled on:  Monday, November 2.  Report to [facility] Admitting at 5:30 AM.  Call this number if you have problems the morning of surgery: [phone]   Remember:   Do not eat food or drink liquids after midnight Sunday.   Take these medicines the morning of surgery with A SIP OF WATER: amLODipine (NORVASC), carvedilol (COREG), cetirizine (ZYRTEC), cloNIDine (CATAPRES) ,levothyroxine (SYNTHROID, LEVOTHROID), pantoprazole (PROTONIX).              Take if needed: nitroGLYCERIN (NITROSTAT).               Stop taking Vitamins, Herbal products ( Coenzyme AQ 10) and Plavix 7 days prior to surgery.   Do not wear jewelry, make-up or nail polish.  Do not wear lotions, powders, or perfumes.   Do not shave 48 hours prior to surgery.   Do not bring valuables to the hospital.             [facility] is not responsible  for any belongings or valuables.               Contacts, dentures or bridgework may not be worn into surgery.  Leave suitcase in the car. After surgery it may be brought to your room.  For patients admitted to the hospital, discharge time is determined by your  treatment team.                Special Instructions: Review  [place] - Preparing For Surgery.   Please read over the following fact sheets that you were given: Pain Booklet, Coughing and Deep Breathing, Blood Transfusion Information and Surgical Site Infection Prevention And Incentive Spirometry.

## 2014-06-12 LAB — HEMOGLOBIN A1C
Hgb A1c MFr Bld: 8 % — ABNORMAL HIGH (ref <5.7)
Mean Plasma Glucose: 183 mg/dL — ABNORMAL HIGH (ref <117)

## 2014-06-13 MED ORDER — DEXTROSE 5 % IV SOLN
1.5000 g | INTRAVENOUS | Status: AC
  Administered 2014-06-14: 1.5 g via INTRAVENOUS
  Administered 2014-06-14: .75 g via INTRAVENOUS
  Filled 2014-06-13: qty 1.5

## 2014-06-13 MED ORDER — PHENYLEPHRINE HCL 10 MG/ML IJ SOLN
30.0000 ug/min | INTRAVENOUS | Status: AC
  Administered 2014-06-14: 20 ug/min via INTRAVENOUS
  Filled 2014-06-13: qty 2

## 2014-06-13 MED ORDER — PLASMA-LYTE 148 IV SOLN
INTRAVENOUS | Status: AC
  Administered 2014-06-14: 500 mL
  Filled 2014-06-13: qty 2.5

## 2014-06-13 MED ORDER — SODIUM CHLORIDE 0.9 % IV SOLN
INTRAVENOUS | Status: AC
  Administered 2014-06-14: 69.8 mL/h via INTRAVENOUS
  Filled 2014-06-13: qty 40

## 2014-06-13 MED ORDER — POTASSIUM CHLORIDE 2 MEQ/ML IV SOLN
80.0000 meq | INTRAVENOUS | Status: DC
  Filled 2014-06-13: qty 40

## 2014-06-13 MED ORDER — DEXTROSE 5 % IV SOLN
750.0000 mg | INTRAVENOUS | Status: DC
  Filled 2014-06-13: qty 750

## 2014-06-13 MED ORDER — SODIUM CHLORIDE 0.9 % IV SOLN
INTRAVENOUS | Status: DC
  Filled 2014-06-13: qty 30

## 2014-06-13 MED ORDER — DEXMEDETOMIDINE HCL IN NACL 400 MCG/100ML IV SOLN
0.1000 ug/kg/h | INTRAVENOUS | Status: AC
  Administered 2014-06-14: 0.2 ug/kg/h via INTRAVENOUS
  Filled 2014-06-13: qty 100

## 2014-06-13 MED ORDER — METOPROLOL TARTRATE 12.5 MG HALF TABLET: 12.5000 mg | ORAL_TABLET | Freq: Once | ORAL | Status: DC

## 2014-06-13 MED ORDER — DOPAMINE-DEXTROSE 3.2-5 MG/ML-% IV SOLN
0.0000 ug/kg/min | INTRAVENOUS | Status: DC
  Filled 2014-06-13: qty 250

## 2014-06-13 MED ORDER — EPINEPHRINE HCL 1 MG/ML IJ SOLN
0.5000 ug/min | INTRAMUSCULAR | Status: DC
  Filled 2014-06-13: qty 4

## 2014-06-13 MED ORDER — NITROGLYCERIN IN D5W 200-5 MCG/ML-% IV SOLN
2.0000 ug/min | INTRAVENOUS | Status: AC
  Administered 2014-06-14: 5 ug/min via INTRAVENOUS
  Filled 2014-06-13: qty 250

## 2014-06-13 MED ORDER — MAGNESIUM SULFATE 50 % IJ SOLN
40.0000 meq | INTRAMUSCULAR | Status: DC
  Filled 2014-06-13: qty 10

## 2014-06-13 MED ORDER — VANCOMYCIN HCL 10 G IV SOLR
1250.0000 mg | INTRAVENOUS | Status: AC
  Administered 2014-06-14: 1250 mg via INTRAVENOUS
  Filled 2014-06-13 (×2): qty 1250

## 2014-06-13 MED ORDER — INSULIN REGULAR HUMAN 100 UNIT/ML IJ SOLN
INTRAMUSCULAR | Status: AC
  Administered 2014-06-14: 2.8 [IU]/h via INTRAVENOUS
  Filled 2014-06-13: qty 2.5

## 2014-06-13 MED ORDER — CHLORHEXIDINE GLUCONATE 4 % EX LIQD
30.0000 mL | CUTANEOUS | Status: DC
  Filled 2014-06-13: qty 30

## 2014-06-14 ENCOUNTER — Encounter (HOSPITAL_COMMUNITY): Payer: Self-pay | Admitting: *Deleted

## 2014-06-14 ENCOUNTER — Inpatient Hospital Stay (HOSPITAL_COMMUNITY): Payer: Medicare Other

## 2014-06-14 ENCOUNTER — Inpatient Hospital Stay (HOSPITAL_COMMUNITY)
Admission: RE | Admit: 2014-06-14 | Discharge: 2014-06-18 | DRG: 236 | Disposition: A | Discharge status: Home / Self Care | Payer: Medicare Other | Source: Ambulatory Visit | Attending: Surgery | Admitting: Surgery

## 2014-06-14 ENCOUNTER — Inpatient Hospital Stay (HOSPITAL_COMMUNITY): Payer: Medicare Other | Admitting: Anesthesiology

## 2014-06-14 ENCOUNTER — Encounter (HOSPITAL_COMMUNITY)
Admission: RE | Disposition: A | Discharge status: Home / Self Care | Payer: Medicare Other | Source: Ambulatory Visit | Attending: Surgery

## 2014-06-14 DIAGNOSIS — Z8249 Family history of ischemic heart disease and other diseases of the circulatory system: Secondary | ICD-10-CM

## 2014-06-14 DIAGNOSIS — Z886 Allergy status to analgesic agent status: Secondary | ICD-10-CM

## 2014-06-14 DIAGNOSIS — E039 Hypothyroidism, unspecified: Secondary | ICD-10-CM | POA: Diagnosis present

## 2014-06-14 DIAGNOSIS — R000 Tachycardia, unspecified: Secondary | ICD-10-CM | POA: Diagnosis not present

## 2014-06-14 DIAGNOSIS — Z91013 Allergy to seafood: Secondary | ICD-10-CM

## 2014-06-14 DIAGNOSIS — Y712 Prosthetic and other implants, materials and accessory cardiovascular devices associated with adverse incidents: Secondary | ICD-10-CM | POA: Diagnosis present

## 2014-06-14 DIAGNOSIS — E8770 Fluid overload, unspecified: Secondary | ICD-10-CM | POA: Diagnosis not present

## 2014-06-14 DIAGNOSIS — G4730 Sleep apnea, unspecified: Secondary | ICD-10-CM | POA: Diagnosis present

## 2014-06-14 DIAGNOSIS — Z79899 Other long term (current) drug therapy: Secondary | ICD-10-CM

## 2014-06-14 DIAGNOSIS — I2510 Atherosclerotic heart disease of native coronary artery without angina pectoris: Secondary | ICD-10-CM

## 2014-06-14 DIAGNOSIS — Z955 Presence of coronary angioplasty implant and graft: Secondary | ICD-10-CM

## 2014-06-14 DIAGNOSIS — I25119 Atherosclerotic heart disease of native coronary artery with unspecified angina pectoris: Secondary | ICD-10-CM

## 2014-06-14 DIAGNOSIS — D62 Acute posthemorrhagic anemia: Secondary | ICD-10-CM | POA: Diagnosis not present

## 2014-06-14 DIAGNOSIS — Z951 Presence of aortocoronary bypass graft: Secondary | ICD-10-CM

## 2014-06-14 DIAGNOSIS — Z7902 Long term (current) use of antithrombotics/antiplatelets: Secondary | ICD-10-CM | POA: Diagnosis not present

## 2014-06-14 DIAGNOSIS — R110 Nausea: Secondary | ICD-10-CM | POA: Diagnosis not present

## 2014-06-14 DIAGNOSIS — T82857A Stenosis of cardiac prosthetic devices, implants and grafts, initial encounter: Secondary | ICD-10-CM | POA: Diagnosis present

## 2014-06-14 DIAGNOSIS — Y832 Surgical operation with anastomosis, bypass or graft as the cause of abnormal reaction of the patient, or of later complication, without mention of misadventure at the time of the procedure: Secondary | ICD-10-CM | POA: Diagnosis not present

## 2014-06-14 DIAGNOSIS — K219 Gastro-esophageal reflux disease without esophagitis: Secondary | ICD-10-CM | POA: Diagnosis present

## 2014-06-14 DIAGNOSIS — E119 Type 2 diabetes mellitus without complications: Secondary | ICD-10-CM | POA: Diagnosis present

## 2014-06-14 DIAGNOSIS — I9789 Other postprocedural complications and disorders of the circulatory system, not elsewhere classified: Secondary | ICD-10-CM | POA: Diagnosis not present

## 2014-06-14 DIAGNOSIS — Y92239 Unspecified place in hospital as the place of occurrence of the external cause: Secondary | ICD-10-CM | POA: Diagnosis not present

## 2014-06-14 DIAGNOSIS — I2584 Coronary atherosclerosis due to calcified coronary lesion: Secondary | ICD-10-CM | POA: Diagnosis present

## 2014-06-14 DIAGNOSIS — I10 Essential (primary) hypertension: Secondary | ICD-10-CM | POA: Diagnosis present

## 2014-06-14 DIAGNOSIS — I25110 Atherosclerotic heart disease of native coronary artery with unstable angina pectoris: Secondary | ICD-10-CM | POA: Diagnosis present

## 2014-06-14 DIAGNOSIS — Z882 Allergy status to sulfonamides status: Secondary | ICD-10-CM

## 2014-06-14 DIAGNOSIS — I25709 Atherosclerosis of coronary artery bypass graft(s), unspecified, with unspecified angina pectoris: Secondary | ICD-10-CM

## 2014-06-14 DIAGNOSIS — Z888 Allergy status to other drugs, medicaments and biological substances status: Secondary | ICD-10-CM | POA: Diagnosis not present

## 2014-06-14 HISTORY — PX: CORONARY ARTERY BYPASS GRAFT: SHX141

## 2014-06-14 HISTORY — PX: TEE WITHOUT CARDIOVERSION: SHX5443

## 2014-06-14 LAB — CBC
HCT: 30.6 % — ABNORMAL LOW (ref 36.0–46.0)
HCT: 32 % — ABNORMAL LOW (ref 36.0–46.0)
Hemoglobin: 10.1 g/dL — ABNORMAL LOW (ref 12.0–15.0)
Hemoglobin: 10.7 g/dL — ABNORMAL LOW (ref 12.0–15.0)
MCH: 27.3 pg (ref 26.0–34.0)
MCH: 27 pg (ref 26.0–34.0)
MCHC: 33 g/dL (ref 30.0–36.0)
MCHC: 33.4 g/dL (ref 30.0–36.0)
MCV: 82.7 fL (ref 78.0–100.0)
MCV: 80.6 fL (ref 78.0–100.0)
Platelets: 200 10*3/uL (ref 150–400)
Platelets: 233 10*3/uL (ref 150–400)
RBC: 3.7 MIL/uL — ABNORMAL LOW (ref 3.87–5.11)
RBC: 3.97 MIL/uL (ref 3.87–5.11)
RDW: 15.3 % (ref 11.5–15.5)
RDW: 15.2 % (ref 11.5–15.5)
WBC: 17.9 10*3/uL — ABNORMAL HIGH (ref 4.0–10.5)
WBC: 18.7 10*3/uL — ABNORMAL HIGH (ref 4.0–10.5)

## 2014-06-14 LAB — POCT I-STAT 3, ART BLOOD GAS (G3+)
Acid-Base Excess: 3 mmol/L — ABNORMAL HIGH (ref 0.0–2.0)
Acid-Base Excess: 3 mmol/L — ABNORMAL HIGH (ref 0.0–2.0)
Acid-base deficit: 2 mmol/L (ref 0.0–2.0)
Acid-base deficit: 1 mmol/L (ref 0.0–2.0)
Bicarbonate: 27.4 mEq/L — ABNORMAL HIGH (ref 20.0–24.0)
Bicarbonate: 26.8 mEq/L — ABNORMAL HIGH (ref 20.0–24.0)
Bicarbonate: 22.8 mEq/L (ref 20.0–24.0)
Bicarbonate: 24.3 mEq/L — ABNORMAL HIGH (ref 20.0–24.0)
Bicarbonate: 25.7 mEq/L — ABNORMAL HIGH (ref 20.0–24.0)
O2 Saturation: 100 %
O2 Saturation: 100 %
O2 Saturation: 98 %
O2 Saturation: 99 %
O2 Saturation: 99 %
Patient temperature: 35.9
Patient temperature: 36.6
Patient temperature: 36.6
TCO2: 29 mmol/L (ref 0–100)
TCO2: 28 mmol/L (ref 0–100)
TCO2: 24 mmol/L (ref 0–100)
TCO2: 26 mmol/L (ref 0–100)
TCO2: 27 mmol/L (ref 0–100)
pCO2 arterial: 40.1 mmHg (ref 35.0–45.0)
pCO2 arterial: 35.8 mmHg (ref 35.0–45.0)
pCO2 arterial: 37.1 mmHg (ref 35.0–45.0)
pCO2 arterial: 43.3 mmHg (ref 35.0–45.0)
pCO2 arterial: 43.9 mmHg (ref 35.0–45.0)
pH, Arterial: 7.443 (ref 7.350–7.450)
pH, Arterial: 7.482 — ABNORMAL HIGH (ref 7.350–7.450)
pH, Arterial: 7.391 (ref 7.350–7.450)
pH, Arterial: 7.356 (ref 7.350–7.450)
pH, Arterial: 7.374 (ref 7.350–7.450)
pO2, Arterial: 374 mmHg — ABNORMAL HIGH (ref 80.0–100.0)
pO2, Arterial: 282 mmHg — ABNORMAL HIGH (ref 80.0–100.0)
pO2, Arterial: 100 mmHg (ref 80.0–100.0)
pO2, Arterial: 134 mmHg — ABNORMAL HIGH (ref 80.0–100.0)
pO2, Arterial: 162 mmHg — ABNORMAL HIGH (ref 80.0–100.0)

## 2014-06-14 LAB — POCT I-STAT, CHEM 8
BUN: 11 mg/dL (ref 6–23)
BUN: 10 mg/dL (ref 6–23)
BUN: 8 mg/dL (ref 6–23)
BUN: 9 mg/dL (ref 6–23)
BUN: 7 mg/dL (ref 6–23)
Calcium, Ion: 1.2 mmol/L (ref 1.13–1.30)
Calcium, Ion: 0.97 mmol/L — ABNORMAL LOW (ref 1.13–1.30)
Calcium, Ion: 1 mmol/L — ABNORMAL LOW (ref 1.13–1.30)
Calcium, Ion: 1.02 mmol/L — ABNORMAL LOW (ref 1.13–1.30)
Calcium, Ion: 1.1 mmol/L — ABNORMAL LOW (ref 1.13–1.30)
Chloride: 99 mEq/L (ref 96–112)
Chloride: 95 mEq/L — ABNORMAL LOW (ref 96–112)
Chloride: 97 mEq/L (ref 96–112)
Chloride: 98 mEq/L (ref 96–112)
Chloride: 103 mEq/L (ref 96–112)
Creatinine, Ser: 0.4 mg/dL — ABNORMAL LOW (ref 0.50–1.10)
Creatinine, Ser: 0.3 mg/dL — ABNORMAL LOW (ref 0.50–1.10)
Creatinine, Ser: 0.4 mg/dL — ABNORMAL LOW (ref 0.50–1.10)
Creatinine, Ser: 0.3 mg/dL — ABNORMAL LOW (ref 0.50–1.10)
Creatinine, Ser: 0.4 mg/dL — ABNORMAL LOW (ref 0.50–1.10)
Glucose, Bld: 153 mg/dL — ABNORMAL HIGH (ref 70–99)
Glucose, Bld: 118 mg/dL — ABNORMAL HIGH (ref 70–99)
Glucose, Bld: 127 mg/dL — ABNORMAL HIGH (ref 70–99)
Glucose, Bld: 176 mg/dL — ABNORMAL HIGH (ref 70–99)
Glucose, Bld: 111 mg/dL — ABNORMAL HIGH (ref 70–99)
HCT: 32 % — ABNORMAL LOW (ref 36.0–46.0)
HCT: 20 % — ABNORMAL LOW (ref 36.0–46.0)
HCT: 23 % — ABNORMAL LOW (ref 36.0–46.0)
HCT: 24 % — ABNORMAL LOW (ref 36.0–46.0)
HCT: 32 % — ABNORMAL LOW (ref 36.0–46.0)
Hemoglobin: 10.9 g/dL — ABNORMAL LOW (ref 12.0–15.0)
Hemoglobin: 6.8 g/dL — CL (ref 12.0–15.0)
Hemoglobin: 7.8 g/dL — ABNORMAL LOW (ref 12.0–15.0)
Hemoglobin: 8.2 g/dL — ABNORMAL LOW (ref 12.0–15.0)
Hemoglobin: 10.9 g/dL — ABNORMAL LOW (ref 12.0–15.0)
Potassium: 3.3 mEq/L — ABNORMAL LOW (ref 3.7–5.3)
Potassium: 3.2 mEq/L — ABNORMAL LOW (ref 3.7–5.3)
Potassium: 3.6 mEq/L — ABNORMAL LOW (ref 3.7–5.3)
Potassium: 3.8 mEq/L (ref 3.7–5.3)
Potassium: 3.3 mEq/L — ABNORMAL LOW (ref 3.7–5.3)
Sodium: 136 mEq/L — ABNORMAL LOW (ref 137–147)
Sodium: 132 mEq/L — ABNORMAL LOW (ref 137–147)
Sodium: 137 mEq/L (ref 137–147)
Sodium: 133 mEq/L — ABNORMAL LOW (ref 137–147)
Sodium: 137 mEq/L (ref 137–147)
TCO2: 28 mmol/L (ref 0–100)
TCO2: 26 mmol/L (ref 0–100)
TCO2: 26 mmol/L (ref 0–100)
TCO2: 25 mmol/L (ref 0–100)
TCO2: 20 mmol/L (ref 0–100)

## 2014-06-14 LAB — GLUCOSE, CAPILLARY
Glucose-Capillary: 162 mg/dL — ABNORMAL HIGH (ref 70–99)
Glucose-Capillary: 103 mg/dL — ABNORMAL HIGH (ref 70–99)
Glucose-Capillary: 116 mg/dL — ABNORMAL HIGH (ref 70–99)
Glucose-Capillary: 105 mg/dL — ABNORMAL HIGH (ref 70–99)
Glucose-Capillary: 75 mg/dL (ref 70–99)
Glucose-Capillary: 93 mg/dL (ref 70–99)
Glucose-Capillary: 102 mg/dL — ABNORMAL HIGH (ref 70–99)
Glucose-Capillary: 109 mg/dL — ABNORMAL HIGH (ref 70–99)
Glucose-Capillary: 136 mg/dL — ABNORMAL HIGH (ref 70–99)
Glucose-Capillary: 154 mg/dL — ABNORMAL HIGH (ref 70–99)
Glucose-Capillary: 131 mg/dL — ABNORMAL HIGH (ref 70–99)
Glucose-Capillary: 116 mg/dL — ABNORMAL HIGH (ref 70–99)
Glucose-Capillary: 98 mg/dL (ref 70–99)

## 2014-06-14 LAB — PREPARE RBC (CROSSMATCH)

## 2014-06-14 LAB — APTT: aPTT: 37 seconds (ref 24–37)

## 2014-06-14 LAB — CREATININE, SERUM
Creatinine, Ser: 0.47 mg/dL — ABNORMAL LOW (ref 0.50–1.10)
GFR calc Af Amer: >90 mL/min (ref >90)
GFR calc non Af Amer: >90 mL/min (ref >90)

## 2014-06-14 LAB — POCT I-STAT 4, (NA,K, GLUC, HGB,HCT)
Glucose, Bld: 118 mg/dL — ABNORMAL HIGH (ref 70–99)
HCT: 31 % — ABNORMAL LOW (ref 36.0–46.0)
Hemoglobin: 10.5 g/dL — ABNORMAL LOW (ref 12.0–15.0)
Potassium: 3.1 mEq/L — ABNORMAL LOW (ref 3.7–5.3)
Sodium: 138 mEq/L (ref 137–147)

## 2014-06-14 LAB — MAGNESIUM: Magnesium: 2.8 mg/dL — ABNORMAL HIGH (ref 1.5–2.5)

## 2014-06-14 LAB — HEMOGLOBIN AND HEMATOCRIT, BLOOD
HCT: 20.6 % — ABNORMAL LOW (ref 36.0–46.0)
Hemoglobin: 6.6 g/dL — CL (ref 12.0–15.0)

## 2014-06-14 LAB — PROTIME-INR
INR: 1.59 — ABNORMAL HIGH (ref 0.00–1.49)
Prothrombin Time: 19.1 seconds — ABNORMAL HIGH (ref 11.6–15.2)

## 2014-06-14 LAB — PLATELET COUNT: Platelets: 261 10*3/uL (ref 150–400)

## 2014-06-14 SURGERY — CORONARY ARTERY BYPASS GRAFTING (CABG)
Anesthesia: General | Site: Chest

## 2014-06-14 MED ORDER — PHENYLEPHRINE HCL 10 MG/ML IJ SOLN
10.0000 mg | INTRAVENOUS | Status: DC | PRN
  Administered 2014-06-14: 20 ug/min via INTRAVENOUS

## 2014-06-14 MED ORDER — MAGNESIUM SULFATE 4 GM/100ML IV SOLN
4.0000 g | Freq: Once | INTRAVENOUS | Status: AC
  Administered 2014-06-14: 4 g via INTRAVENOUS
  Filled 2014-06-14: qty 100

## 2014-06-14 MED ORDER — VECURONIUM BROMIDE 10 MG IV SOLR
INTRAVENOUS | Status: AC
  Filled 2014-06-14: qty 10

## 2014-06-14 MED ORDER — POTASSIUM CHLORIDE 10 MEQ/50ML IV SOLN
10.0000 meq | INTRAVENOUS | Status: AC
  Administered 2014-06-14 (×3): 10 meq via INTRAVENOUS

## 2014-06-14 MED ORDER — LACTATED RINGERS IV SOLN
INTRAVENOUS | Status: DC | PRN
  Administered 2014-06-14 (×2): via INTRAVENOUS

## 2014-06-14 MED ORDER — LACTATED RINGERS IV SOLN
INTRAVENOUS | Status: DC | PRN
  Administered 2014-06-14: 09:00:00 via INTRAVENOUS

## 2014-06-14 MED ORDER — DEXMEDETOMIDINE HCL IN NACL 200 MCG/50ML IV SOLN
0.0000 ug/kg/h | INTRAVENOUS | Status: DC
  Administered 2014-06-14: 0.7 ug/kg/h via INTRAVENOUS

## 2014-06-14 MED ORDER — SODIUM CHLORIDE 0.9 % IV SOLN: 250.0000 mL | INTRAVENOUS | Status: DC

## 2014-06-14 MED ORDER — VECURONIUM BROMIDE 10 MG IV SOLR
INTRAVENOUS | Status: DC | PRN
  Administered 2014-06-14 (×2): 5 mg via INTRAVENOUS

## 2014-06-14 MED ORDER — DEXTROSE 5 % IV SOLN
1.5000 g | Freq: Two times a day (BID) | INTRAVENOUS | Status: AC
  Administered 2014-06-14 – 2014-06-16 (×4): 1.5 g via INTRAVENOUS
  Filled 2014-06-14 (×4): qty 1.5

## 2014-06-14 MED ORDER — HEMOSTATIC AGENTS (NO CHARGE) OPTIME
TOPICAL | Status: DC | PRN
  Administered 2014-06-14: 1 via TOPICAL

## 2014-06-14 MED ORDER — FENTANYL CITRATE 0.05 MG/ML IJ SOLN
INTRAMUSCULAR | Status: AC
  Filled 2014-06-14: qty 5

## 2014-06-14 MED ORDER — SODIUM CHLORIDE 0.9 % IV SOLN
INTRAVENOUS | Status: DC
  Administered 2014-06-14: 10 mL/h via INTRAVENOUS

## 2014-06-14 MED ORDER — LACTATED RINGERS IV SOLN
INTRAVENOUS | Status: DC
  Administered 2014-06-14 – 2014-06-15 (×2): 20 mL/h via INTRAVENOUS

## 2014-06-14 MED ORDER — PROTAMINE SULFATE 10 MG/ML IV SOLN
INTRAVENOUS | Status: DC | PRN
  Administered 2014-06-14: 30 mg via INTRAVENOUS
  Administered 2014-06-14 (×2): 50 mg via INTRAVENOUS
  Administered 2014-06-14: 80 mg via INTRAVENOUS

## 2014-06-14 MED ORDER — LEVOTHYROXINE SODIUM 75 MCG PO TABS
75.0000 ug | ORAL_TABLET | Freq: Every day | ORAL | Status: DC
  Administered 2014-06-15 – 2014-06-18 (×4): 75 ug via ORAL
  Filled 2014-06-14 (×5): qty 1

## 2014-06-14 MED ORDER — SODIUM CHLORIDE 0.9 % IV SOLN
INTRAVENOUS | Status: DC
  Administered 2014-06-14: 20:00:00 via INTRAVENOUS
  Administered 2014-06-14: 5.8 [IU]/h via INTRAVENOUS
  Filled 2014-06-14 (×2): qty 2.5

## 2014-06-14 MED ORDER — 0.9 % SODIUM CHLORIDE (POUR BTL) OPTIME
TOPICAL | Status: DC | PRN
  Administered 2014-06-14: 1000 mL

## 2014-06-14 MED ORDER — METOPROLOL TARTRATE 25 MG/10 ML ORAL SUSPENSION
12.5000 mg | Freq: Two times a day (BID) | ORAL | Status: DC
  Administered 2014-06-14: 12.5 mg
  Filled 2014-06-14 (×3): qty 5

## 2014-06-14 MED ORDER — PANTOPRAZOLE SODIUM 40 MG PO TBEC: 40.0000 mg | DELAYED_RELEASE_TABLET | Freq: Every day | ORAL | Status: DC

## 2014-06-14 MED ORDER — ACETAMINOPHEN 650 MG RE SUPP
650.0000 mg | Freq: Once | RECTAL | Status: AC
  Administered 2014-06-14: 650 mg via RECTAL

## 2014-06-14 MED ORDER — ALBUMIN HUMAN 5 % IV SOLN
250.0000 mL | INTRAVENOUS | Status: AC | PRN
  Administered 2014-06-14 (×2): 250 mL via INTRAVENOUS

## 2014-06-14 MED ORDER — THROMBIN 20000 UNITS EX SOLR
OROMUCOSAL | Status: DC | PRN
  Administered 2014-06-14 (×2): 1 mL via TOPICAL

## 2014-06-14 MED ORDER — VANCOMYCIN HCL IN DEXTROSE 1-5 GM/200ML-% IV SOLN
1000.0000 mg | Freq: Once | INTRAVENOUS | Status: AC
  Administered 2014-06-14: 1000 mg via INTRAVENOUS
  Filled 2014-06-14: qty 200

## 2014-06-14 MED ORDER — BISACODYL 10 MG RE SUPP: 10.0000 mg | Freq: Every day | RECTAL | Status: DC

## 2014-06-14 MED ORDER — MORPHINE SULFATE 2 MG/ML IJ SOLN
1.0000 mg | INTRAMUSCULAR | Status: AC | PRN
  Administered 2014-06-14: 2 mg via INTRAVENOUS

## 2014-06-14 MED ORDER — THROMBIN 20000 UNITS EX SOLR
CUTANEOUS | Status: AC
  Filled 2014-06-14: qty 20000

## 2014-06-14 MED ORDER — TRAMADOL HCL 50 MG PO TABS: 50.0000 mg | ORAL_TABLET | ORAL | Status: DC | PRN

## 2014-06-14 MED ORDER — PHENYLEPHRINE HCL 10 MG/ML IJ SOLN
0.0000 ug/min | INTRAVENOUS | Status: DC
  Administered 2014-06-14: 10 ug/min via INTRAVENOUS
  Filled 2014-06-14: qty 2

## 2014-06-14 MED ORDER — ACETAMINOPHEN 160 MG/5ML PO SOLN: 650.0000 mg | Freq: Once | ORAL | Status: AC

## 2014-06-14 MED ORDER — CLOPIDOGREL BISULFATE 75 MG PO TABS
75.0000 mg | ORAL_TABLET | Freq: Every day | ORAL | Status: DC
  Administered 2014-06-15 – 2014-06-18 (×4): 75 mg via ORAL
  Filled 2014-06-14 (×4): qty 1

## 2014-06-14 MED ORDER — FENTANYL CITRATE 0.05 MG/ML IJ SOLN
INTRAMUSCULAR | Status: AC
  Filled 2014-06-14: qty 2

## 2014-06-14 MED ORDER — ACETAMINOPHEN 160 MG/5ML PO SOLN: 1000.0000 mg | Freq: Four times a day (QID) | ORAL | Status: DC

## 2014-06-14 MED ORDER — BISACODYL 5 MG PO TBEC
10.0000 mg | DELAYED_RELEASE_TABLET | Freq: Every day | ORAL | Status: DC
  Administered 2014-06-15: 10 mg via ORAL
  Filled 2014-06-14: qty 2

## 2014-06-14 MED ORDER — ROCURONIUM BROMIDE 100 MG/10ML IV SOLN
INTRAVENOUS | Status: DC | PRN
  Administered 2014-06-14: 50 mg via INTRAVENOUS

## 2014-06-14 MED ORDER — LACTATED RINGERS IV SOLN: 500.0000 mL | Freq: Once | INTRAVENOUS | Status: AC | PRN

## 2014-06-14 MED ORDER — THROMBIN 5000 UNITS EX SOLR
CUTANEOUS | Status: AC
  Filled 2014-06-14: qty 5000

## 2014-06-14 MED ORDER — ONDANSETRON HCL 4 MG/2ML IJ SOLN
INTRAMUSCULAR | Status: AC
Start: 2014-06-14 — End: 2014-06-14
  Filled 2014-06-14: qty 2

## 2014-06-14 MED ORDER — POTASSIUM CHLORIDE 10 MEQ/50ML IV SOLN
10.0000 meq | INTRAVENOUS | Status: AC
  Administered 2014-06-14 (×2): 10 meq via INTRAVENOUS
  Filled 2014-06-14 (×2): qty 50

## 2014-06-14 MED ORDER — OXYCODONE HCL 5 MG PO TABS: 5.0000 mg | ORAL_TABLET | ORAL | Status: DC | PRN

## 2014-06-14 MED ORDER — METOCLOPRAMIDE HCL 5 MG/ML IJ SOLN
10.0000 mg | Freq: Once | INTRAMUSCULAR | Status: AC
  Administered 2014-06-14: 10 mg via INTRAVENOUS
  Filled 2014-06-14: qty 2

## 2014-06-14 MED ORDER — ONDANSETRON HCL 4 MG/2ML IJ SOLN
4.0000 mg | Freq: Four times a day (QID) | INTRAMUSCULAR | Status: DC | PRN
Start: 2014-06-14 — End: 2014-06-16
  Administered 2014-06-14 – 2014-06-15 (×3): 4 mg via INTRAVENOUS
  Filled 2014-06-14 (×3): qty 2

## 2014-06-14 MED ORDER — ALBUMIN HUMAN 5 % IV SOLN
INTRAVENOUS | Status: DC | PRN
  Administered 2014-06-14: 10:00:00 via INTRAVENOUS

## 2014-06-14 MED ORDER — MIDAZOLAM HCL 10 MG/2ML IJ SOLN
INTRAMUSCULAR | Status: AC
  Filled 2014-06-14: qty 2

## 2014-06-14 MED ORDER — POTASSIUM CHLORIDE 10 MEQ/50ML IV SOLN
10.0000 meq | INTRAVENOUS | Status: AC | PRN
  Administered 2014-06-14 (×3): 10 meq via INTRAVENOUS

## 2014-06-14 MED ORDER — HEPARIN SODIUM (PORCINE) 1000 UNIT/ML IJ SOLN
INTRAMUSCULAR | Status: AC
  Filled 2014-06-14: qty 1

## 2014-06-14 MED ORDER — PROPOFOL 10 MG/ML IV BOLUS
INTRAVENOUS | Status: AC
  Filled 2014-06-14: qty 20

## 2014-06-14 MED ORDER — INSULIN REGULAR BOLUS VIA INFUSION
0.0000 [IU] | Freq: Three times a day (TID) | INTRAVENOUS | Status: DC
  Administered 2014-06-15: 1 [IU] via INTRAVENOUS
  Filled 2014-06-14: qty 10

## 2014-06-14 MED ORDER — EPHEDRINE SULFATE 50 MG/ML IJ SOLN
INTRAMUSCULAR | Status: AC
  Filled 2014-06-14: qty 1

## 2014-06-14 MED ORDER — ARTIFICIAL TEARS OP OINT
TOPICAL_OINTMENT | OPHTHALMIC | Status: DC | PRN
  Administered 2014-06-14: 1 via OPHTHALMIC

## 2014-06-14 MED ORDER — MORPHINE SULFATE 2 MG/ML IJ SOLN
2.0000 mg | INTRAMUSCULAR | Status: DC | PRN
  Administered 2014-06-14 – 2014-06-15 (×6): 2 mg via INTRAVENOUS
  Filled 2014-06-14 (×7): qty 1

## 2014-06-14 MED ORDER — ONDANSETRON HCL 4 MG/2ML IJ SOLN
INTRAMUSCULAR | Status: DC | PRN
  Administered 2014-06-14: 4 mg via INTRAVENOUS

## 2014-06-14 MED ORDER — PROTAMINE SULFATE 10 MG/ML IV SOLN
INTRAVENOUS | Status: AC
  Filled 2014-06-14: qty 25

## 2014-06-14 MED ORDER — SODIUM CHLORIDE 0.9 % IJ SOLN
3.0000 mL | Freq: Two times a day (BID) | INTRAMUSCULAR | Status: DC
  Administered 2014-06-15 (×2): 3 mL via INTRAVENOUS

## 2014-06-14 MED ORDER — METOPROLOL TARTRATE 1 MG/ML IV SOLN: 2.5000 mg | INTRAVENOUS | Status: DC | PRN

## 2014-06-14 MED ORDER — LIDOCAINE HCL (CARDIAC) 20 MG/ML IV SOLN
INTRAVENOUS | Status: AC
  Filled 2014-06-14: qty 5

## 2014-06-14 MED ORDER — MIDAZOLAM HCL 2 MG/2ML IJ SOLN: 2.0000 mg | INTRAMUSCULAR | Status: DC | PRN

## 2014-06-14 MED ORDER — METOPROLOL TARTRATE 12.5 MG HALF TABLET
12.5000 mg | ORAL_TABLET | Freq: Two times a day (BID) | ORAL | Status: DC
Start: 2014-06-14 — End: 2014-06-15
  Filled 2014-06-14 (×3): qty 1

## 2014-06-14 MED ORDER — SUCCINYLCHOLINE CHLORIDE 20 MG/ML IJ SOLN
INTRAMUSCULAR | Status: AC
  Filled 2014-06-14: qty 1

## 2014-06-14 MED ORDER — ROCURONIUM BROMIDE 50 MG/5ML IV SOLN
INTRAVENOUS | Status: AC
Start: 2014-06-14 — End: 2014-06-14
  Filled 2014-06-14: qty 1

## 2014-06-14 MED ORDER — PROPOFOL 10 MG/ML IV BOLUS
INTRAVENOUS | Status: DC | PRN
  Administered 2014-06-14: 60 mg via INTRAVENOUS

## 2014-06-14 MED ORDER — MIDAZOLAM HCL 5 MG/5ML IJ SOLN
INTRAMUSCULAR | Status: DC | PRN
  Administered 2014-06-14: 5 mg via INTRAVENOUS
  Administered 2014-06-14 (×2): 2 mg via INTRAVENOUS
  Administered 2014-06-14: 1 mg via INTRAVENOUS
  Administered 2014-06-14: 2 mg via INTRAVENOUS

## 2014-06-14 MED ORDER — FAMOTIDINE IN NACL 20-0.9 MG/50ML-% IV SOLN
20.0000 mg | Freq: Two times a day (BID) | INTRAVENOUS | Status: DC
  Administered 2014-06-14: 20 mg via INTRAVENOUS

## 2014-06-14 MED ORDER — SODIUM CHLORIDE 0.9 % IV SOLN
INTRAVENOUS | Status: DC | PRN
  Administered 2014-06-14: 10:00:00 via INTRAVENOUS

## 2014-06-14 MED ORDER — HEPARIN SODIUM (PORCINE) 1000 UNIT/ML IJ SOLN
INTRAMUSCULAR | Status: DC | PRN
  Administered 2014-06-14: 23000 [IU] via INTRAVENOUS

## 2014-06-14 MED ORDER — NITROGLYCERIN IN D5W 200-5 MCG/ML-% IV SOLN
0.0000 ug/min | INTRAVENOUS | Status: DC
  Administered 2014-06-14: 5 ug/min via INTRAVENOUS

## 2014-06-14 MED ORDER — LIDOCAINE HCL (CARDIAC) 20 MG/ML IV SOLN
INTRAVENOUS | Status: DC | PRN
  Administered 2014-06-14: 60 mg via INTRAVENOUS

## 2014-06-14 MED ORDER — MIDAZOLAM HCL 2 MG/2ML IJ SOLN
INTRAMUSCULAR | Status: AC
  Filled 2014-06-14: qty 2

## 2014-06-14 MED ORDER — ARTIFICIAL TEARS OP OINT
TOPICAL_OINTMENT | OPHTHALMIC | Status: AC
Start: 2014-06-14 — End: 2014-06-14
  Filled 2014-06-14: qty 3.5

## 2014-06-14 MED ORDER — SODIUM CHLORIDE 0.45 % IV SOLN
INTRAVENOUS | Status: DC
  Administered 2014-06-14: 12:00:00 via INTRAVENOUS

## 2014-06-14 MED ORDER — SODIUM CHLORIDE 0.9 % IJ SOLN: 3.0000 mL | INTRAMUSCULAR | Status: DC | PRN

## 2014-06-14 MED ORDER — ACETAMINOPHEN 500 MG PO TABS
1000.0000 mg | ORAL_TABLET | Freq: Four times a day (QID) | ORAL | Status: DC
  Administered 2014-06-15 – 2014-06-16 (×4): 1000 mg via ORAL
  Filled 2014-06-14 (×8): qty 2

## 2014-06-14 MED ORDER — FENTANYL CITRATE 0.05 MG/ML IJ SOLN
INTRAMUSCULAR | Status: DC | PRN
  Administered 2014-06-14: 100 ug via INTRAVENOUS
  Administered 2014-06-14: 500 ug via INTRAVENOUS
  Administered 2014-06-14 (×2): 100 ug via INTRAVENOUS
  Administered 2014-06-14 (×2): 250 ug via INTRAVENOUS
  Administered 2014-06-14: 150 ug via INTRAVENOUS
  Administered 2014-06-14: 100 ug via INTRAVENOUS
  Administered 2014-06-14: 50 ug via INTRAVENOUS

## 2014-06-14 MED ORDER — PHENYLEPHRINE 40 MCG/ML (10ML) SYRINGE FOR IV PUSH (FOR BLOOD PRESSURE SUPPORT)
PREFILLED_SYRINGE | INTRAVENOUS | Status: AC
  Filled 2014-06-14: qty 10

## 2014-06-14 MED ORDER — DOCUSATE SODIUM 100 MG PO CAPS
200.0000 mg | ORAL_CAPSULE | Freq: Every day | ORAL | Status: DC
  Administered 2014-06-15: 200 mg via ORAL
  Filled 2014-06-14: qty 2

## 2014-06-14 MED FILL — Lidocaine HCl IV Inj 20 MG/ML: INTRAVENOUS | Qty: 5 | Status: AC

## 2014-06-14 MED FILL — Mannitol IV Soln 20%: INTRAVENOUS | Qty: 500 | Status: AC

## 2014-06-14 MED FILL — Sodium Bicarbonate IV Soln 8.4%: INTRAVENOUS | Qty: 50 | Status: AC

## 2014-06-14 MED FILL — Sodium Chloride IV Soln 0.9%: INTRAVENOUS | Qty: 2000 | Status: AC

## 2014-06-14 MED FILL — Heparin Sodium (Porcine) Inj 1000 Unit/ML: INTRAMUSCULAR | Qty: 30 | Status: AC

## 2014-06-14 MED FILL — Electrolyte-R (PH 7.4) Solution: INTRAVENOUS | Qty: 4000 | Status: AC

## 2014-06-14 SURGICAL SUPPLY — 102 items
ATTRACTOMAT 16X20 MAGNETIC DRP (DRAPES) ×3 IMPLANT
BAG DECANTER FOR FLEXI CONT (MISCELLANEOUS) ×3 IMPLANT
BANDAGE ELASTIC 4 VELCRO ST LF (GAUZE/BANDAGES/DRESSINGS) ×3 IMPLANT
BANDAGE ELASTIC 6 VELCRO ST LF (GAUZE/BANDAGES/DRESSINGS) ×3 IMPLANT
BASKET HEART (ORDER IN 25'S) (MISCELLANEOUS) ×1
BASKET HEART (ORDER IN 25S) (MISCELLANEOUS) ×2 IMPLANT
BLADE STERNUM SYSTEM 6 (BLADE) ×3 IMPLANT
BLADE SURG 10 STRL SS (BLADE) ×3 IMPLANT
BNDG GAUZE ELAST 4 BULKY (GAUZE/BANDAGES/DRESSINGS) ×3 IMPLANT
CANISTER SUCTION 2500CC (MISCELLANEOUS) ×3 IMPLANT
CARDIAC SUCTION (MISCELLANEOUS) ×3 IMPLANT
CATH ROBINSON RED A/P 18FR (CATHETERS) ×6 IMPLANT
CATH THORACIC 28FR (CATHETERS) ×3 IMPLANT
CATH THORACIC 36FR (CATHETERS) ×3 IMPLANT
CATH THORACIC 36FR RT ANG (CATHETERS) ×3 IMPLANT
CLIP TI MEDIUM 24 (CLIP) IMPLANT
CLIP TI WIDE RED SMALL 24 (CLIP) IMPLANT
COVER SURGICAL LIGHT HANDLE (MISCELLANEOUS) ×3 IMPLANT
CRADLE DONUT ADULT HEAD (MISCELLANEOUS) ×3 IMPLANT
DRAPE CARDIOVASCULAR INCISE (DRAPES) ×1
DRAPE SLUSH/WARMER DISC (DRAPES) ×3 IMPLANT
DRAPE SRG 135X102X78XABS (DRAPES) ×2 IMPLANT
DRSG COVADERM 4X14 (GAUZE/BANDAGES/DRESSINGS) ×3 IMPLANT
ELECT CAUTERY BLADE 6.4 (BLADE) ×3 IMPLANT
ELECT REM PT RETURN 9FT ADLT (ELECTROSURGICAL) ×6
ELECTRODE REM PT RTRN 9FT ADLT (ELECTROSURGICAL) ×4 IMPLANT
GAUZE SPONGE 4X4 12PLY STRL (GAUZE/BANDAGES/DRESSINGS) ×6 IMPLANT
GLOVE BIO SURGEON STRL SZ 6 (GLOVE) IMPLANT
GLOVE BIO SURGEON STRL SZ 6.5 (GLOVE) IMPLANT
GLOVE BIO SURGEON STRL SZ7 (GLOVE) IMPLANT
GLOVE BIO SURGEON STRL SZ7.5 (GLOVE) IMPLANT
GLOVE BIOGEL M 6.5 STRL (GLOVE) ×6 IMPLANT
GLOVE BIOGEL M STER SZ 6 (GLOVE) ×12 IMPLANT
GLOVE BIOGEL PI IND STRL 6 (GLOVE) ×6 IMPLANT
GLOVE BIOGEL PI IND STRL 6.5 (GLOVE) IMPLANT
GLOVE BIOGEL PI IND STRL 7.0 (GLOVE) ×10 IMPLANT
GLOVE BIOGEL PI INDICATOR 6 (GLOVE) ×3
GLOVE BIOGEL PI INDICATOR 6.5 (GLOVE)
GLOVE BIOGEL PI INDICATOR 7.0 (GLOVE) ×5
GLOVE EUDERMIC 7 POWDERFREE (GLOVE) ×6 IMPLANT
GLOVE ORTHO TXT STRL SZ7.5 (GLOVE) IMPLANT
GOWN STRL REUS W/ TWL LRG LVL3 (GOWN DISPOSABLE) ×16 IMPLANT
GOWN STRL REUS W/ TWL XL LVL3 (GOWN DISPOSABLE) ×2 IMPLANT
GOWN STRL REUS W/TWL LRG LVL3 (GOWN DISPOSABLE) ×8
GOWN STRL REUS W/TWL XL LVL3 (GOWN DISPOSABLE) ×1
HEMOSTAT POWDER SURGIFOAM 1G (HEMOSTASIS) ×9 IMPLANT
HEMOSTAT SURGICEL 2X14 (HEMOSTASIS) ×3 IMPLANT
INSERT FOGARTY 61MM (MISCELLANEOUS) IMPLANT
INSERT FOGARTY XLG (MISCELLANEOUS) IMPLANT
KIT BASIN OR (CUSTOM PROCEDURE TRAY) ×3 IMPLANT
KIT CATH CPB BARTLE (MISCELLANEOUS) ×3 IMPLANT
KIT ROOM TURNOVER OR (KITS) ×3 IMPLANT
KIT SUCTION CATH 14FR (SUCTIONS) ×6 IMPLANT
KIT VASOVIEW W/TROCAR VH 2000 (KITS) ×3 IMPLANT
NS IRRIG 1000ML POUR BTL (IV SOLUTION) ×15 IMPLANT
PACK OPEN HEART (CUSTOM PROCEDURE TRAY) ×3 IMPLANT
PAD ARMBOARD 7.5X6 YLW CONV (MISCELLANEOUS) ×6 IMPLANT
PAD ELECT DEFIB RADIOL ZOLL (MISCELLANEOUS) ×3 IMPLANT
PENCIL BUTTON HOLSTER BLD 10FT (ELECTRODE) ×3 IMPLANT
PUNCH AORTIC ROTATE 4.0MM (MISCELLANEOUS) IMPLANT
PUNCH AORTIC ROTATE 4.5MM 8IN (MISCELLANEOUS) ×3 IMPLANT
PUNCH AORTIC ROTATE 5MM 8IN (MISCELLANEOUS) IMPLANT
SET CARDIOPLEGIA MPS 5001102 (MISCELLANEOUS) ×3 IMPLANT
SPONGE GAUZE 4X4 12PLY STER LF (GAUZE/BANDAGES/DRESSINGS) ×6 IMPLANT
SPONGE INTESTINAL PEANUT (DISPOSABLE) IMPLANT
SPONGE LAP 18X18 X RAY DECT (DISPOSABLE) IMPLANT
SPONGE LAP 4X18 X RAY DECT (DISPOSABLE) ×3 IMPLANT
SUT BONE WAX W31G (SUTURE) ×3 IMPLANT
SUT MNCRL AB 4-0 PS2 18 (SUTURE) IMPLANT
SUT PROLENE 3 0 SH DA (SUTURE) IMPLANT
SUT PROLENE 3 0 SH1 36 (SUTURE) ×3 IMPLANT
SUT PROLENE 4 0 RB 1 (SUTURE)
SUT PROLENE 4 0 SH DA (SUTURE) IMPLANT
SUT PROLENE 4-0 RB1 .5 CRCL 36 (SUTURE) IMPLANT
SUT PROLENE 5 0 C 1 36 (SUTURE) IMPLANT
SUT PROLENE 6 0 C 1 30 (SUTURE) IMPLANT
SUT PROLENE 7 0 BV 1 (SUTURE) IMPLANT
SUT PROLENE 7 0 BV1 MDA (SUTURE) ×6 IMPLANT
SUT PROLENE 8 0 BV175 6 (SUTURE) IMPLANT
SUT SILK  1 MH (SUTURE)
SUT SILK 1 MH (SUTURE) IMPLANT
SUT STEEL 6MS V (SUTURE) ×6 IMPLANT
SUT STEEL STERNAL CCS#1 18IN (SUTURE) IMPLANT
SUT STEEL SZ 6 DBL 3X14 BALL (SUTURE) IMPLANT
SUT VIC AB 1 CTX 36 (SUTURE) ×3
SUT VIC AB 1 CTX36XBRD ANBCTR (SUTURE) ×6 IMPLANT
SUT VIC AB 2-0 CT1 27 (SUTURE) ×1
SUT VIC AB 2-0 CT1 TAPERPNT 27 (SUTURE) ×2 IMPLANT
SUT VIC AB 2-0 CTX 27 (SUTURE) IMPLANT
SUT VIC AB 3-0 SH 27 (SUTURE)
SUT VIC AB 3-0 SH 27X BRD (SUTURE) IMPLANT
SUT VIC AB 3-0 X1 27 (SUTURE) ×3 IMPLANT
SUT VICRYL 4-0 PS2 18IN ABS (SUTURE) IMPLANT
SUTURE E-PAK OPEN HEART (SUTURE) ×3 IMPLANT
SYSTEM SAHARA CHEST DRAIN ATS (WOUND CARE) ×3 IMPLANT
TAPE CLOTH SURG 4X10 WHT LF (GAUZE/BANDAGES/DRESSINGS) ×6 IMPLANT
TOWEL OR 17X24 6PK STRL BLUE (TOWEL DISPOSABLE) ×3 IMPLANT
TOWEL OR 17X26 10 PK STRL BLUE (TOWEL DISPOSABLE) ×3 IMPLANT
TRAY FOLEY IC TEMP SENS 16FR (CATHETERS) ×3 IMPLANT
TUBING INSUFFLATION (TUBING) ×3 IMPLANT
UNDERPAD 30X30 INCONTINENT (UNDERPADS AND DIAPERS) ×3 IMPLANT
WATER STERILE IRR 1000ML POUR (IV SOLUTION) ×6 IMPLANT

## 2014-06-14 NOTE — Progress Notes (Signed)
Echocardiogram Echocardiogram Transesophageal has been performed.  Joelene Millin 06/14/2014, 10:24 AM

## 2014-06-14 NOTE — Procedures (Signed)
Extubation Procedure Note  Patient Details:   Name: Susan Barnett DOB: 08-21-1945 MRN: [id number]   Airway Documentation:     Evaluation  O2 sats: stable throughout Complications: No apparent complications Patient did tolerate procedure well. Bilateral Breath Sounds: Clear Suctioning: Oral, Airway Yes   Patient extubated to 4L nasal cannula.  Positive cuff leak.  NIF of -24, VC of 1L.  No evidence of stridor.  Patient able to speak post extubation.  Stated she felt nausea therefore will hold off on performing incentive spirometry until better.    Philomena Doheny 06/14/2014, 5:51 PM

## 2014-06-14 NOTE — Plan of Care (Signed)
Problem: Phase II - Intermediate Post-Op Goal: Wean to Extubate Outcome: Completed/Met Date Met:  06/14/14 Goal: Maintain Hemodynamic Stability Outcome: Progressing Goal: CBGs/Blood Glucose per SCIP Criteria Outcome: Not Progressing Pt continues on GlucoStabilizer  Goal: Pain controlled with appropriate interventions Outcome: Progressing Goal: Advance Diet Outcome: Not Progressing Pt remains NPO Goal: Activity Progressed Outcome: Not Progressing Goal: Patient advanced to Phase III: Barriers addressed Outcome: Progressing

## 2014-06-14 NOTE — Anesthesia Procedure Notes (Signed)
Anesthesia Procedure Note PA catheter:  Routine monitors. Timeout, sterile prep, drape, FBP R neck.  Trendelenburg position.  1% [place] local, finder and trocar RIJ 1st pass with US guidance.  Cordis placed over J wire. PA catheter in easily.  Sterile dressing applied.  Patient tolerated well, VSS.  C Leslee Haueter, MD  06:30-06:42   

## 2014-06-14 NOTE — Progress Notes (Signed)
Dr. Cyndia Bent made aware that althought patient's VSS, PAD running slightly low occasionally with indexs dipping to 1.7-1.8. Ordered to not treat with volume. Will continue to monitor. Richardean Sale, RN

## 2014-06-14 NOTE — Anesthesia Postprocedure Evaluation (Signed)
  Anesthesia Post-op Note  Patient: DIESHA ROSTAD  Procedure(s) Performed: Procedure(s) with comments: CORONARY ARTERY BYPASS GRAFTING (CABG) (N/A) - Times 2 using endoscopically harvested right saphenous vein. TRANSESOPHAGEAL ECHOCARDIOGRAM (TEE) (N/A)  Patient Location: SICU  Anesthesia Type:General  Level of Consciousness: sedated and Patient remains intubated per anesthesia plan  Airway and Oxygen Therapy: Patient remains intubated per anesthesia plan and Patient placed on Ventilator (see vital sign flow sheet for setting)  Post-op Pain: none  Post-op Assessment: Post-op Vital signs reviewed, Patient's Cardiovascular Status Stable, Respiratory Function Stable, Patent Airway, No signs of Nausea or vomiting and Pain level controlled  Post-op Vital Signs: stable  Last Vitals:  Filed Vitals:   06/14/14 1131  BP: 118/66  Pulse: 86  Temp:   Resp:     Complications: No apparent anesthesia complications

## 2014-06-14 NOTE — Op Note (Signed)
CARDIOVASCULAR SURGERY OPERATIVE NOTE  06/14/2014  Surgeon:  Gaye Pollack, MD  First Assistant: Jadene Pierini,  PA-C   Preoperative Diagnosis:  Severe multi-vessel coronary artery disease   Postoperative Diagnosis:  Same   Procedure:  1. Median Sternotomy 2. Extracorporeal circulation 3.   Coronary artery bypass grafting x 2   SVG to diagonal  SVG to PDA  4.   Endoscopic vein harvest from the right leg   Anesthesia:  General Endotracheal   Clinical History/Surgical Indication:  The patient is a 68 year old woman with a history of hypertension, hypothyroidism, diabetes and know coronary artery disease who has undergone bare metal stenting of the RCA in 2002 with restenosis treated with brachytherapy. She developed edge restenosis and was treated with a DES and then developed restenosis between the stents treated with angioplasty and stenting with a long DES in 2006. She subsequently developed chest pain and had in-stent restenosis treated with an Angioscore balloon in 2012. She now presents with progressive angina over the last 6 months. Cath shows severe in-stent restenosis in the mid to distal RCA up to 95%. This extends almost to the takeoff of the PDA. A PCI was attempted but the region was not able to be dilated with a conventional balloon and scoring balloons could not be advanced into the region. IVUS demonstrated concentric calcification. There was also a 70-80% stenosis of the proximal small to moderate first diagonal and the proximal small OM1. She has severe high grade restenosis in the stented mid to distal RCA with the PDA and PL at risk. This is not amenable to PCI. The diagonal is graftable but the OM1 is probably too small to have a reliable graft placed. She has diffusely irregular vessels and I am sure she will have diffuse plaque given the appearance of her vessels. I think  CABG is the best treatment for her to prevent further ischemia and infarction and to improve her quality of life which she says has decreased because she can't do much without getting chest pain. I discussed the operative procedure with the patient and her husband including alternatives, benefits and risks; including but not limited to bleeding, blood transfusion, infection, stroke, myocardial infarction, graft failure, heart block requiring a permanent pacemaker, organ dysfunction, and death. [name] understands and agrees to proceed.   Preparation:  The patient was seen in the preoperative holding area and the correct patient, correct operation were confirmed with the patient after reviewing the medical record and catheterization. The consent was signed by me. Preoperative antibiotics were given. A pulmonary arterial line and radial arterial line were placed by the anesthesia team. The patient was taken back to the operating room and positioned supine on the operating room table. After being placed under general endotracheal anesthesia by the anesthesia team a foley catheter was placed. The neck, chest, abdomen, and both legs were prepped with betadine soap and solution and draped in the usual sterile manner. A surgical time-out was taken and the correct patient and operative procedure were confirmed with the nursing and anesthesia staff.   Cardiopulmonary Bypass:  A median sternotomy was performed. The pericardium was opened in the midline. Right ventricular function appeared normal. The ascending aorta was of normal size and had no palpable plaque. There were no contraindications to aortic cannulation or cross-clamping. The patient was fully systemically heparinized and the ACT was maintained > 400 sec. The proximal aortic arch was cannulated with a 20 F aortic cannula for arterial inflow.  Venous cannulation was performed via the right atrial appendage using a two-staged venous cannula. An  antegrade cardioplegia/vent cannula was inserted into the mid-ascending aorta. Aortic occlusion was performed with a single cross-clamp. Systemic cooling to 32 degrees Centigrade and topical cooling of the heart with iced saline were used. Hyperkalemic antegrade cold blood cardioplegia was used to induce diastolic arrest and was then given at about 20 minute intervals throughout the period of arrest to maintain myocardial temperature at or below 10 degrees centigrade. A temperature probe was inserted into the interventricular septum and an insulating pad was placed in the pericardium.   Left internal mammary harvest:  The left side of the sternum was retracted using the Rultract retractor. The left internal mammary artery was harvested as a pedicle graft. All side branches were clipped. It was a medium-sized vessel of good quality with excellent blood flow. It was ligated distally and divided. It was sprayed with topical papaverine solution to prevent vasospasm.   Endoscopic vein harvest:  The right greater saphenous vein was harvested endoscopically through a 2 cm incision medial to the right knee. It was harvested from the upper thigh to below the knee. It was a medium-sized vein of good quality. The side branches were all ligated with 4-0 silk ties.    Coronary arteries:  The coronary arteries were examined.   Diagonal: small to moderate sized vessel with no distal disease.  RCA:  PDA diffusely diseased moderate sized vessel. The PL is very small.   Grafts:   1. SVG to diagonal:  1.6 mm. It was sewn end to side using 7-0 prolene continuous suture. 2. SVG to PDA:  1.75 mm. It was sewn end to side using 7-0 prolene continuous suture.   The proximal vein graft anastomoses were performed to the mid-ascending aorta using continuous 6-0 prolene suture. Graft markers were placed around the proximal anastomoses.   Completion:  The patient was rewarmed to 37 degrees Centigrade. The  crossclamp was removed with a time of 42 minutes. There was spontaneous return of sinus rhythm. The distal and proximal anastomoses were checked for hemostasis. The position of the grafts was satisfactory. Two temporary epicardial pacing wires were placed on the right atrium and two on the right ventricle. The patient was weaned from CPB without difficulty on no inotropes. CPB time was 59 minutes. Cardiac output was 4 LPM. Heparin was fully reversed with protamine and the aortic and venous cannulas removed. Hemostasis was achieved. Mediastinal and left pleural drainage tubes were placed. The sternum was closed with double #6 stainless steel wires. The fascia was closed with continuous # 1 vicryl suture. The subcutaneous tissue was closed with 2-0 vicryl continuous suture. The skin was closed with 3-0 vicryl subcuticular suture. All sponge, needle, and instrument counts were reported correct at the end of the case. Dry sterile dressings were placed over the incisions and around the chest tubes which were connected to pleurevac suction. The patient was then transported to the surgical intensive care unit in critical but stable condition.

## 2014-06-14 NOTE — Transfer of Care (Signed)
Immediate Anesthesia Transfer of Care Note  Patient: Susan Barnett  Procedure(s) Performed: Procedure(s) with comments: CORONARY ARTERY BYPASS GRAFTING (CABG) (N/A) - Times 2 using endoscopically harvested right saphenous vein. TRANSESOPHAGEAL ECHOCARDIOGRAM (TEE) (N/A)  Patient Location: ICU  Anesthesia Type:General  Level of Consciousness: sedated and unresponsive  Airway & Oxygen Therapy: Patient remains intubated per anesthesia plan and Patient placed on Ventilator (see vital sign flow sheet for setting)  Post-op Assessment: Report to ICU RN [id number]  Post vital signs: Reviewed and stable  Complications: No apparent anesthesia complications

## 2014-06-14 NOTE — Progress Notes (Signed)
Patient ID: Susan Barnett, female   DOB: 1946/05/22, 68 y.o.   MRN: [id number] EVENING ROUNDS NOTE :     [address]       [place] [address]             [phone]                 Day of Surgery Procedure(s) (LRB): CORONARY ARTERY BYPASS GRAFTING (CABG) (N/A) TRANSESOPHAGEAL ECHOCARDIOGRAM (TEE) (N/A)  Total Length of Stay:  LOS: 0 days  BP 122/66 mmHg  Pulse 86  Temp(Src) 97.9 F (36.6 C) (Core (Comment))  Resp 14  SpO2 100%  .Intake/Output      11/01 0701 - 11/02 0700 11/02 0701 - 11/03 0700   I.V.  2644.3   Blood  472   NG/GT  30   IV Piggyback  1200   Total Intake   4346.3   Urine  1525   Blood  900   Chest Tube  63   Total Output   2488   Net   +1858.3          . sodium chloride 20 mL/hr at 06/14/14 1300  . sodium chloride    . [START ON 06/15/2014] sodium chloride    . dexmedetomidine Stopped (06/14/14 1600)  . insulin (NOVOLIN-R) infusion 1.3 Units/hr (06/14/14 1600)  . lactated ringers 20 mL/hr at 06/14/14 1145  . nitroGLYCERIN Stopped (06/14/14 1125)  . phenylephrine (NEO-SYNEPHRINE) Adult infusion Stopped (06/14/14 1120)     Lab Results  Component Value Date   WBC 17.9* 06/14/2014   HGB 10.5* 06/14/2014   HCT 31.0* 06/14/2014   PLT 200 06/14/2014   GLUCOSE 118* 06/14/2014   CHOL  08/13/2007    116        ATP III CLASSIFICATION:  <200     mg/dL   Desirable  200-239  mg/dL   Borderline High  >=240    mg/dL   High   TRIG 69 08/13/2007   HDL 41 08/13/2007   LDLCALC  08/13/2007    61        Total Cholesterol/HDL:CHD Risk Coronary Heart Disease Risk Table                     Men   Women  1/2 Average Risk   3.4   3.3   ALT 15 06/11/2014   AST 18 06/11/2014   NA 138 06/14/2014   K 3.1* 06/14/2014   CL 98 06/14/2014   CREATININE 0.30* 06/14/2014   BUN 9 06/14/2014   CO2 20 06/11/2014   INR 1.59* 06/14/2014   HGBA1C 8.0* 06/11/2014   Awake, alert, still intubated but weaning  Not bleeding  Grace Isaac  MD  Beeper [phone] Office [phone] 06/14/2014 5:24 PM

## 2014-06-14 NOTE — Anesthesia Preprocedure Evaluation (Signed)
Anesthesia Evaluation  Patient identified by MRN, date of birth, ID band Patient awake    Reviewed: Allergy & Precautions, H&P , NPO status , Patient's Chart, lab work & pertinent test results  Airway        Dental   Pulmonary sleep apnea ,          Cardiovascular hypertension, + angina + CAD + dysrhythmias     Neuro/Psych  Headaches,    GI/Hepatic hiatal hernia, GERD-  ,  Endo/Other  diabetes, Type 2, Oral Hypoglycemic AgentsHypothyroidism   Renal/GU      Musculoskeletal  (+) Arthritis -,   Abdominal   Peds  Hematology   Anesthesia Other Findings   Reproductive/Obstetrics                             Anesthesia Physical Anesthesia Plan  ASA: III  Anesthesia Plan: General   Post-op Pain Management:    Induction: Intravenous  Airway Management Planned: Oral ETT  Additional Equipment: Arterial line, CVP, PA Cath and TEE  Intra-op Plan:   Post-operative Plan: Post-operative intubation/ventilation  Informed Consent: I have reviewed the patients History and Physical, chart, labs and discussed the procedure including the risks, benefits and alternatives for the proposed anesthesia with the patient or authorized representative who has indicated his/her understanding and acceptance.     Plan Discussed with: CRNA, Surgeon and Anesthesiologist  Anesthesia Plan Comments:         Anesthesia Quick Evaluation

## 2014-06-14 NOTE — Brief Op Note (Signed)
      [address]       [place] [address]             [phone]     06/14/2014  10:04 AM  PATIENT:  Susan Barnett  68 y.o. female  PRE-OPERATIVE DIAGNOSIS:  coronary artery disease  POST-OPERATIVE DIAGNOSIS:  coronary artery disease  PROCEDURE:  Procedure(s): CORONARY ARTERY BYPASS GRAFTING (CABG)X2 SVG-PD;SVG-DIAG TRANSESOPHAGEAL ECHOCARDIOGRAM (TEE) EVH RIGHT THIGH  SURGEON:  Surgeon(s): Gaye Pollack, MD  PHYSICIAN ASSISTANT: WAYNE GOLD PA-C  ANESTHESIA:   general  PATIENT CONDITION:  ICU - intubated and hemodynamically stable.  PRE-OPERATIVE WEIGHT: 71kg  EBL: SEE ANEST/PERFUSION RECORDS  COMPLICATIONS: NO KNOWN

## 2014-06-14 NOTE — Interval H&P Note (Signed)
History and Physical Interval Note:  06/14/2014 6:50 AM  Susan Barnett  has presented today for surgery, with the diagnosis of coronary artery disease  The various methods of treatment have been discussed with the patient and family. After consideration of risks, benefits and other options for treatment, the patient has consented to  Procedure(s): CORONARY ARTERY BYPASS GRAFTING (CABG) (N/A) TRANSESOPHAGEAL ECHOCARDIOGRAM (TEE) (N/A) as a surgical intervention .  The patient's history has been reviewed, patient examined, no change in status, stable for surgery.  I have reviewed the patient's chart and labs.  Questions were answered to the patient's satisfaction.     Gaye Pollack

## 2014-06-14 NOTE — OR Nursing (Signed)
SICU First call @ 1028

## 2014-06-14 NOTE — H&P (Signed)
[address]       [place] [address]             [phone]      Cardiothoracic Surgery History and Physical   PCP is Susan Frees, MD Referring Provider is Susan Grooms, MD  Chief Complaint  Patient presents with  . Coronary Artery Disease    Surgical eval, cardiac cath 06/02/14    HPI:  The patient is a 68 year old woman with a history of hypertension, hypothyroidism, diabetes and know coronary artery disease who has undergone bare metal stenting of the RCA in 2002 with restenosis treated with brachytherapy. She developed edge restenosis and was treated with a DES and then developed restenosis between the stents treated with angioplasty and stenting with a long DES in 2006. She subsequently developed chest pain and had in-stent restenosis treated with an Angioscore balloon in 2012. She now presents with progressive angina over the last 6 months. Cath shows severe in-stent restenosis in the mid to distal RCA up to 95%. This extends almost to the takeoff of the PDA. A PCI was attempted but the region was not able to be dilated with a conventional balloon and scoring balloons could not be advanced into the region. IVUS demonstrated concentric calcification. There was also a 70-80% stenosis of the proximal small to moderate first diagonal and the proximal small OM1.  Past Medical History  Diagnosis Date  . Hypertension   . Gastric polyp   . Rectal bleeding   . Hypothyroidism   . Heartburn   . Sleep apnea     cpap used nightly x 2 yrs now.  . Occasional tremors     "of head", slight hands  . Hiatal hernia     mild head tremors  . Anemia     mild  . CAD (coronary artery disease)     Dr. Linard Barnett follows, no problems now  . GERD (gastroesophageal reflux disease)   . Complication of anesthesia   . PONV (postoperative nausea and vomiting)   . Anginal pain   . Diabetes  mellitus, type 2     TYPE 2    Past Surgical History  Procedure Laterality Date  . Coronary stents      x3 stents placed-prior ablation  . Dilation and curettage of uterus    . Elbow fracture surgery Left   . Incontinence surgery    . Knee arthroscopy Right 02/24/2014    Procedure: RIGHT KNEE ARTHROSCOPY WITH DEBRIDEMENT ; Surgeon: Susan Alf, MD; Location: [facility]; Service: Orthopedics; Laterality: Right;  . Cardiac catheterization  06/02/2014    LAD 30%, D1 80%, CFX 755, RCA > 95% ISR, rx w/ PTCA, heavy calcification, EF 65%    Family History  Problem Relation Age of Onset  . Heart attack Mother     Social History History  Substance Use Topics  . Smoking status: Never Smoker   . Smokeless tobacco: Never Used  . Alcohol Use: No    Current Outpatient Prescriptions  Medication Sig Dispense Refill  . amLODipine (NORVASC) 5 MG tablet Take 5 mg by mouth every morning.     Marland Kitchen atorvastatin (LIPITOR) 10 MG tablet Take 10 mg by mouth daily.    . carvedilol (COREG) 25 MG tablet Take 25 mg by mouth 2 (two) times daily.    . cetirizine (ZYRTEC) 10 MG tablet Take 10 mg by mouth daily.    . cloNIDine (CATAPRES)  0.1 MG tablet Take 0.1 mg by mouth at bedtime.     . clopidogrel (PLAVIX) 75 MG tablet Take 75 mg by mouth daily.    . Coenzyme Q10 (CO Q 10) 100 MG CAPS Take 300 mg by mouth 2 (two) times daily.    . diphenhydramine-acetaminophen (TYLENOL PM) 25-500 MG TABS Take 1 tablet by mouth at bedtime as needed (for sleep or pain).     . Exenatide ER (BYDUREON) 2 MG SUSR Inject 2 mg into the skin once a week. Sundays    . levothyroxine (SYNTHROID, LEVOTHROID) 75 MCG tablet Take 75 mcg by mouth every morning.    Marland Kitchen losartan-hydrochlorothiazide (HYZAAR) 100-25 MG per tablet Take 1 tablet by mouth every morning.     . Multiple  Vitamins-Minerals (MULTIVITAMIN PO) Take 1 tablet by mouth daily.    . nitroGLYCERIN (NITROSTAT) 0.4 MG SL tablet Place 0.4 mg under the tongue every 5 (five) minutes as needed for chest pain (MA X 3 TABLETS).     . pantoprazole (PROTONIX) 40 MG tablet Take 1 tablet (40 mg total) by mouth daily before breakfast. 30 tablet 11  . potassium chloride SA (K-DUR,KLOR-CON) 20 MEQ tablet Take 20 mEq by mouth daily.    . sitaGLIPtin-metformin (JANUMET) 50-1000 MG per tablet Take 1 tablet by mouth 2 (two) times daily. Hold for 48 hours, restart on 06/05/2014     No current facility-administered medications for this visit.    Allergies  Allergen Reactions  . Asa [Aspirin] Anaphylaxis  . Compazine [Prochlorperazine Edisylate] Swelling and Other (See Comments)    Tongue swelling   . Nsaids Hives and Swelling  . Shellfish Allergy Hives and Swelling  . Sulfa Antibiotics Hives and Swelling  . Zantac [Ranitidine Hcl] Hives and Swelling    Review of Systems  Constitutional: Positive for fatigue. Negative for activity change, appetite change and unexpected weight change.  Eyes:   Blurry vision and floaters  Respiratory: Positive for shortness of breath.   Uses CPAP at night  Cardiovascular: Positive for chest pain. Negative for leg swelling.  Gastrointestinal:   Reflux  Endocrine: Negative.  Genitourinary: Negative.  Musculoskeletal: Positive for arthralgias.  Neurological: Positive for numbness and headaches.   Hands and feet  Hematological: Negative.  Psychiatric/Behavioral: Negative.    BP 115/69  Pulse 88  Resp 20  Ht 5\' 2"  (1.575 m)  Wt 155 lb (70.308 kg)  BMI 28.34 kg/m2  SpO2 98% Physical Exam  Constitutional: She is oriented to person, place, and time. She appears well-developed and well-nourished. No distress.  HENT:  Head: Normocephalic and atraumatic.  Mouth/Throat: Oropharynx is clear  and moist.  Eyes: EOM are normal. Pupils are equal, round, and reactive to light.  Neck: Normal range of motion. Neck supple. No JVD present. No thyromegaly present.  Cardiovascular: Normal rate, regular rhythm and intact distal pulses.  No murmur heard. Pulmonary/Chest: Effort normal and breath sounds normal. No respiratory distress. She has no rales.  Abdominal: Soft. Bowel sounds are normal. She exhibits no distension and no mass. There is no tenderness.  Musculoskeletal: She exhibits edema.  Lymphadenopathy:   She has no cervical adenopathy.  Neurological: She is alert and oriented to person, place, and time. She has normal strength. No cranial nerve deficit or sensory deficit.  Skin: Skin is warm and dry.  Psychiatric: She has a normal mood and affect.    Diagnostic Tests:  Left Heart Catheterization with Coronary Angiography , PCI, and IVUS Report  Susan Barnett  68 y.o.  female  1945-12-25  Procedure Date: 06/02/2014  Referring Physician: [place]  Primary Cardiologist:: HWBSmith  INDICATIONS: Angina pectoris with prior history of RCA stenting including 4 prior stents (bare-metal stent x1; DES Cypher stents x3) with some regions having 3 layers of overlapping stent. LAD stent procedure was 2006. Last intervention was a scoring balloon angioplasty in 2008 for ISR. Before the drug-eluting stents were placed, the patient had undergone brachytherapy for in-stent restenosis of bare-metal stents in 2002.  PROCEDURE: 1. Left heart catheterization; 2. Coronary angiography; 3. Left ventriculography; 4. PTCA RCA; 5. Post procedure intravascular ultrasound  CONSENT:  The risks, benefits, and details of the procedure were explained in detail to the patient. Risks including death, stroke, heart attack, kidney injury, allergy, limb ischemia, bleeding and radiation injury were discussed. The patient verbalized understanding and wanted to proceed. Informed written consent was  obtained.  PROCEDURE TECHNIQUE: After Xylocaine anesthesia a 5 French cath slender sheath was placed in the right radial artery with an angiocath and the modified Seldinger technique. Coronary angiography was done using a 5 F JR 4 and JL 3.5 cm diagnostic catheter. Left ventriculography was done using the JR 4 catheter and hand injection.  Angiography demonstrated calcification of the wide patency of the left coronary system. The right coronary contains a mid to distal in-stent segmental ISR with 90-95% obstruction. I discussed the situation with the patient she prefers not to have surgery. Because of her symptoms we decided to embark upon repeat angioplasty of the stented segment. She also was noted to have new obstructive disease distal to the stent margin before the origin of the PDA. Bivalirudin bolus and infusion was started. A CT was documented to be therapeutic. We attempted to use a this coring balloon, Angiosculpt 3.0 x 10 mm, or unable to cross the stenosis. We then used a 3.0 x 15 mm Glasscock balloon and dilated to 20 atmospheres. We subsequently upgraded to a 3 5 x 18 mm balloon and dilated up to 18 atmospheres. He still noted had there was deformation of the balloon within the midportion of the stented segment. ST segment elevation and chest discomfort occur with each balloon inflation. I then reconsider the procedure. Post procedure performed intravascular ultrasound and to my surprise we identified circumferential calcification within this persistently obstructed area that had turned to 60 of circumferential calcium. Was also 3 layers with stent in this region the eye this diameter was 1.8 x 1.3 mm in diameter at worst. Angiographically TIMI grade 3 flow was noted. We watched the patient in the cath lab for approximately 15 minutes after the procedure to ensure that I brought closure did not occur. Intravascular ultrasound did not demonstrate any evidence of dissection or thrombus.  The case was  terminated and hemostasis was achieved with wrist band.  CONTRAST: Total of 225 cc.  COMPLICATIONS: None dilatable mid RCA stent  HEMODYNAMICS: Aortic pressure 134/68 mmHg; LV pressure 134/3 mmHg; LVEDP 10 mm mercury  ANGIOGRAPHIC DATA: The left main coronary artery is widely patent.  The left anterior descending artery is .widely patent and terminates at the apex. Proximal 30% narrowing is noted. The first diagonal contains 80% stenosis. It is a small to moderate size vessel..  The left circumflex artery is widely patent giving origin to 3 obtuse marginal branches. The proximal first marginal is relatively small and contains 75% stenosis..  The right coronary artery is is heavily stented from the mid segment to the distal vessel ending proximal to the  origin of the PDA continuation bifurcation. There is severe segmental in-stent restenosis particularly prominent in the distal region of the stent overlap. The region of in-stent restenosis appears to be greater than 95%. TIMI grade 3 flow was noted.  PCI RESULTS: As noted above the mid to distal RCA region of in-stent restenosis was dilated using noncompliant balloons up to peak pressures of 20 atmospheres with a 2 of 35 mm in diameter balloon. We can never cross the stenosis with a scoring balloon. Intravascular ultrasound was done post procedure and demonstrated circumferential calcium within this region with tightest area of 1.3 x 1.8 mm in diameter no obvious dissection is noted within the region of the stented segment. Post angioplasty TIMI grade 3 flow was noted.  LEFT VENTRICULOGRAM: Left ventricular angiogram was done in the 30 RAO projection and revealed normal cavity size and EF 65%.  IMPRESSIONS: 1. Recurrent restenosis in the right coronary distal segment which has multiple prior stents with regions of overlap up to 3 stent layers. This vessel is also had brachii therapy prior to drug-eluting stent implantation. The region is not  dilatable with conventional balloon therapy and we were unable to advance scoring balloons into this region. Intravascular ultrasound demonstrated 360 of calcium. It would likely respond rotational atherectomy but we will need to complete therapy with additional stents which seems unreasonable at this point.  2. Widely patent left coronary with the exception of diagonal 1 and obtuse marginal 1, both of which contain 70-80% proximal stenoses  3. Normal LV function  RECOMMENDATION: The patient has been loaded with Brilinta. IV nitroglycerin has been started. In the absence of evidence of dissection or intimal disruption within the stented segment, IV heparin has not been started. He'll convert the patient back to Plavix tomorrow. If symptoms continue I'll refer her for single-vessel coronary bypass surgery.    Impression:  She has severe high grade restenosis in the stented mid to distal RCA with the PDA and PL at risk. This is not amenable to PCI. The diagonal is graftable but the OM1 is probably too small to have a reliable graft placed. She has diffusely irregular vessels and I am sure she will have diffuse plaque given the appearance of her vessels. I think CABG is the best treatment for her to prevent further ischemia and infarction and to improve her quality of life which she says has decreased because she can't do much without getting chest pain. I discussed the operative procedure with the patient and her husband including alternatives, benefits and risks; including but not limited to bleeding, blood transfusion, infection, stroke, myocardial infarction, graft failure, heart block requiring a permanent pacemaker, organ dysfunction, and death. Susan Barnett understands and agrees to proceed.   Plan:  Coronary artery bypass graft surgery.

## 2014-06-15 ENCOUNTER — Encounter (HOSPITAL_COMMUNITY): Payer: Self-pay

## 2014-06-15 ENCOUNTER — Inpatient Hospital Stay (HOSPITAL_COMMUNITY): Payer: Medicare Other

## 2014-06-15 LAB — BASIC METABOLIC PANEL
Anion gap: 11 (ref 5–15)
BUN: 10 mg/dL (ref 6–23)
CO2: 22 mEq/L (ref 19–32)
Calcium: 7.4 mg/dL — ABNORMAL LOW (ref 8.4–10.5)
Chloride: 104 mEq/L (ref 96–112)
Creatinine, Ser: 0.41 mg/dL — ABNORMAL LOW (ref 0.50–1.10)
GFR calc Af Amer: >90 mL/min (ref >90)
GFR calc non Af Amer: >90 mL/min (ref >90)
Glucose, Bld: 114 mg/dL — ABNORMAL HIGH (ref 70–99)
Potassium: 3.9 mEq/L (ref 3.7–5.3)
Sodium: 137 mEq/L (ref 137–147)

## 2014-06-15 LAB — CBC
HCT: 31.9 % — ABNORMAL LOW (ref 36.0–46.0)
HCT: 31.3 % — ABNORMAL LOW (ref 36.0–46.0)
Hemoglobin: 10.4 g/dL — ABNORMAL LOW (ref 12.0–15.0)
Hemoglobin: 10.3 g/dL — ABNORMAL LOW (ref 12.0–15.0)
MCH: 26.5 pg (ref 26.0–34.0)
MCH: 27.4 pg (ref 26.0–34.0)
MCHC: 32.6 g/dL (ref 30.0–36.0)
MCHC: 32.9 g/dL (ref 30.0–36.0)
MCV: 81.2 fL (ref 78.0–100.0)
MCV: 83.2 fL (ref 78.0–100.0)
Platelets: 240 10*3/uL (ref 150–400)
Platelets: 205 10*3/uL (ref 150–400)
RBC: 3.93 MIL/uL (ref 3.87–5.11)
RBC: 3.76 MIL/uL — ABNORMAL LOW (ref 3.87–5.11)
RDW: 15.5 % (ref 11.5–15.5)
RDW: 15.8 % — ABNORMAL HIGH (ref 11.5–15.5)
WBC: 16.2 10*3/uL — ABNORMAL HIGH (ref 4.0–10.5)
WBC: 16.9 10*3/uL — ABNORMAL HIGH (ref 4.0–10.5)

## 2014-06-15 LAB — GLUCOSE, CAPILLARY
Glucose-Capillary: 101 mg/dL — ABNORMAL HIGH (ref 70–99)
Glucose-Capillary: 101 mg/dL — ABNORMAL HIGH (ref 70–99)
Glucose-Capillary: 106 mg/dL — ABNORMAL HIGH (ref 70–99)
Glucose-Capillary: 113 mg/dL — ABNORMAL HIGH (ref 70–99)
Glucose-Capillary: 113 mg/dL — ABNORMAL HIGH (ref 70–99)
Glucose-Capillary: 114 mg/dL — ABNORMAL HIGH (ref 70–99)
Glucose-Capillary: 114 mg/dL — ABNORMAL HIGH (ref 70–99)
Glucose-Capillary: 119 mg/dL — ABNORMAL HIGH (ref 70–99)
Glucose-Capillary: 123 mg/dL — ABNORMAL HIGH (ref 70–99)
Glucose-Capillary: 124 mg/dL — ABNORMAL HIGH (ref 70–99)
Glucose-Capillary: 153 mg/dL — ABNORMAL HIGH (ref 70–99)
Glucose-Capillary: 163 mg/dL — ABNORMAL HIGH (ref 70–99)
Glucose-Capillary: 184 mg/dL — ABNORMAL HIGH (ref 70–99)
Glucose-Capillary: 248 mg/dL — ABNORMAL HIGH (ref 70–99)
Glucose-Capillary: 81 mg/dL (ref 70–99)
Glucose-Capillary: 97 mg/dL (ref 70–99)
Glucose-Capillary: 98 mg/dL (ref 70–99)

## 2014-06-15 LAB — MAGNESIUM: Magnesium: 2.7 mg/dL — ABNORMAL HIGH (ref 1.5–2.5)

## 2014-06-15 LAB — CREATININE, SERUM
Creatinine, Ser: 0.49 mg/dL — ABNORMAL LOW (ref 0.50–1.10)
GFR calc Af Amer: >90 mL/min (ref >90)
GFR calc non Af Amer: >90 mL/min (ref >90)

## 2014-06-15 MED ORDER — FUROSEMIDE 10 MG/ML IJ SOLN
40.0000 mg | Freq: Two times a day (BID) | INTRAMUSCULAR | Status: AC
  Administered 2014-06-15 (×2): 40 mg via INTRAVENOUS
  Filled 2014-06-15 (×2): qty 4

## 2014-06-15 MED ORDER — CARVEDILOL 12.5 MG PO TABS
12.5000 mg | ORAL_TABLET | Freq: Two times a day (BID) | ORAL | Status: DC
  Administered 2014-06-15 – 2014-06-17 (×5): 12.5 mg via ORAL
  Filled 2014-06-15 (×7): qty 1

## 2014-06-15 MED ORDER — ENOXAPARIN SODIUM 40 MG/0.4ML ~~LOC~~ SOLN
40.0000 mg | Freq: Every day | SUBCUTANEOUS | Status: DC
  Administered 2014-06-15 – 2014-06-17 (×3): 40 mg via SUBCUTANEOUS
  Filled 2014-06-15 (×4): qty 0.4

## 2014-06-15 MED ORDER — SODIUM CHLORIDE 0.9 % IV SOLN: INTRAVENOUS | Status: DC | PRN

## 2014-06-15 MED ORDER — INSULIN DETEMIR 100 UNIT/ML ~~LOC~~ SOLN: 15.0000 [IU] | Freq: Every day | SUBCUTANEOUS | Status: DC

## 2014-06-15 MED ORDER — METOCLOPRAMIDE HCL 5 MG/ML IJ SOLN
10.0000 mg | Freq: Four times a day (QID) | INTRAMUSCULAR | Status: AC
  Administered 2014-06-15 – 2014-06-16 (×4): 10 mg via INTRAVENOUS
  Filled 2014-06-15 (×4): qty 2

## 2014-06-15 MED ORDER — INSULIN DETEMIR 100 UNIT/ML ~~LOC~~ SOLN
15.0000 [IU] | Freq: Every day | SUBCUTANEOUS | Status: DC
  Administered 2014-06-15: 15 [IU] via SUBCUTANEOUS
  Filled 2014-06-15 (×2): qty 0.15

## 2014-06-15 MED ORDER — HYDRALAZINE HCL 20 MG/ML IJ SOLN: 10.0000 mg | Freq: Four times a day (QID) | INTRAMUSCULAR | Status: DC | PRN

## 2014-06-15 MED ORDER — POTASSIUM CHLORIDE 10 MEQ/50ML IV SOLN
10.0000 meq | INTRAVENOUS | Status: AC
  Administered 2014-06-15 (×3): 10 meq via INTRAVENOUS
  Filled 2014-06-15 (×3): qty 50

## 2014-06-15 MED ORDER — INSULIN ASPART 100 UNIT/ML ~~LOC~~ SOLN
0.0000 [IU] | SUBCUTANEOUS | Status: DC
  Administered 2014-06-15: 4 [IU] via SUBCUTANEOUS
  Administered 2014-06-15: 8 [IU] via SUBCUTANEOUS
  Administered 2014-06-15: 2 [IU] via SUBCUTANEOUS

## 2014-06-15 MED FILL — Sodium Chloride IV Soln 0.9%: INTRAVENOUS | Qty: 1000 | Status: AC

## 2014-06-15 MED FILL — Magnesium Sulfate Inj 50%: INTRAMUSCULAR | Qty: 10 | Status: AC

## 2014-06-15 MED FILL — Heparin Sodium (Porcine) Inj 1000 Unit/ML: INTRAMUSCULAR | Qty: 30 | Status: AC

## 2014-06-15 MED FILL — Potassium Chloride Inj 2 mEq/ML: INTRAVENOUS | Qty: 40 | Status: AC

## 2014-06-15 NOTE — Progress Notes (Addendum)
1 Day Post-Op Procedure(s) (LRB): CORONARY ARTERY BYPASS GRAFTING (CABG) (N/A) TRANSESOPHAGEAL ECHOCARDIOGRAM (TEE) (N/A) Subjective: Some nausea overnight  Objective: Vital signs in last 24 hours: Temp:  [96.1 F (35.6 C)-99.5 F (37.5 C)] 99.5 F (37.5 C) (11/03 0700) Pulse Rate:  [77-95] 84 (11/03 0700) Cardiac Rhythm:  [-] Normal sinus rhythm (11/03 0700) Resp:  [7-26] 20 (11/03 0700) BP: (94-142)/(54-97) 130/75 mmHg (11/03 0700) SpO2:  [98 %-100 %] 99 % (11/03 0700) Arterial Line BP: (66-159)/(31-80) 149/64 mmHg (11/03 0700) FiO2 (%):  [40 %-50 %] 40 % (11/02 1715) Weight:  [73.029 kg (161 lb)] 73.029 kg (161 lb) (11/03 0500)  Hemodynamic parameters for last 24 hours: PAP: (16-37)/(8-21) 29/16 mmHg CO:  [2.6 L/min-3.6 L/min] 3.3 L/min CI:  [1.5 L/min/m2-2 L/min/m2] 1.9 L/min/m2  Intake/Output from previous day: 11/02 0701 - 11/03 0700 In: 5412.8 [I.V.:3310.8; Blood:472; NG/GT:30; IV Piggyback:1600] Out: 1975 [Urine:2325; Blood:900; Chest Tube:243] Intake/Output this shift:    General appearance: alert and cooperative Neurologic: intact Heart: regular rate and rhythm, S1, S2 normal, no murmur, click, rub or gallop Lungs: clear to auscultation bilaterally Extremities: edema mild Wound: dressing dry  Lab Results:  Recent Labs  06/14/14 1700 06/14/14 2139 06/15/14 0400  WBC 18.7*  --  16.2*  HGB 10.7* 10.9* 10.4*  HCT 32.0* 32.0* 31.9*  PLT 233  --  240   BMET:  Recent Labs  06/14/14 2139 06/15/14 0400  NA 137 137  K 3.3* 3.9  CL 103 104  CO2  --  22  GLUCOSE 111* 114*  BUN 7 10  CREATININE 0.40* 0.41*  CALCIUM  --  7.4*    PT/INR:  Recent Labs  06/14/14 1130  LABPROT 19.1*  INR 1.59*   ABG    Component Value Date/Time   PHART 7.374 06/14/2014 1917   HCO3 25.7* 06/14/2014 1917   TCO2 20 06/14/2014 2139   ACIDBASEDEF 1.0 06/14/2014 1736   O2SAT 99.0 06/14/2014 1917   CBG (last 3)   Recent Labs  06/14/14 2004 06/14/14 2100  06/14/14 2207  GLUCAP 131* 116* 98   CXR: bibasilar atelectasis  ECG: sinus, normal Assessment/Plan: S/P Procedure(s) (LRB): CORONARY ARTERY BYPASS GRAFTING (CABG) (N/A) TRANSESOPHAGEAL ECHOCARDIOGRAM (TEE) (N/A) Mobilize Diuresis Diabetes control d/c tubes/lines See progression orders  Reglan for nausea. Already receiving some Zofran.   LOS: 1 day    BARTLE,BRYAN K 06/15/2014

## 2014-06-15 NOTE — Plan of Care (Signed)
Problem: Phase II - Intermediate Post-Op Goal: Pain controlled with appropriate interventions Outcome: Completed/Met Date Met:  06/15/14

## 2014-06-15 NOTE — Progress Notes (Signed)
[facility] BRIEF SICU PROGRESS NOTE  1 Day Post-Op  S/P Procedure(s) (LRB): CORONARY ARTERY BYPASS GRAFTING (CABG) (N/A) TRANSESOPHAGEAL ECHOCARDIOGRAM (TEE) (N/A)   Stable day NSR w/ stable BP O2 sats 94-96% on RA UOP adequate  Plan: Continue routine care  OWEN,CLARENCE H 06/15/2014 9:00 PM

## 2014-06-15 NOTE — Plan of Care (Signed)
Problem: Phase II - Intermediate Post-Op Goal: Maintain Hemodynamic Stability Outcome: Completed/Met Date Met:  06/15/14 Goal: Advance Diet Outcome: Progressing

## 2014-06-16 ENCOUNTER — Inpatient Hospital Stay (HOSPITAL_COMMUNITY): Payer: Medicare Other

## 2014-06-16 ENCOUNTER — Encounter (HOSPITAL_COMMUNITY): Payer: Self-pay | Admitting: Surgery

## 2014-06-16 LAB — GLUCOSE, CAPILLARY
Glucose-Capillary: 92 mg/dL (ref 70–99)
Glucose-Capillary: 171 mg/dL — ABNORMAL HIGH (ref 70–99)
Glucose-Capillary: 122 mg/dL — ABNORMAL HIGH (ref 70–99)
Glucose-Capillary: 166 mg/dL — ABNORMAL HIGH (ref 70–99)

## 2014-06-16 LAB — BASIC METABOLIC PANEL
Anion gap: 10 (ref 5–15)
BUN: 8 mg/dL (ref 6–23)
CO2: 27 mEq/L (ref 19–32)
Calcium: 7.7 mg/dL — ABNORMAL LOW (ref 8.4–10.5)
Chloride: 97 mEq/L (ref 96–112)
Creatinine, Ser: 0.47 mg/dL — ABNORMAL LOW (ref 0.50–1.10)
GFR calc Af Amer: >90 mL/min (ref >90)
GFR calc non Af Amer: >90 mL/min (ref >90)
Glucose, Bld: 73 mg/dL (ref 70–99)
Potassium: 3.3 mEq/L — ABNORMAL LOW (ref 3.7–5.3)
Sodium: 134 mEq/L — ABNORMAL LOW (ref 137–147)

## 2014-06-16 LAB — CBC
HCT: 29.5 % — ABNORMAL LOW (ref 36.0–46.0)
Hemoglobin: 9.6 g/dL — ABNORMAL LOW (ref 12.0–15.0)
MCH: 26.5 pg (ref 26.0–34.0)
MCHC: 32.5 g/dL (ref 30.0–36.0)
MCV: 81.5 fL (ref 78.0–100.0)
Platelets: 209 10*3/uL (ref 150–400)
RBC: 3.62 MIL/uL — ABNORMAL LOW (ref 3.87–5.11)
RDW: 15.8 % — ABNORMAL HIGH (ref 11.5–15.5)
WBC: 14.3 10*3/uL — ABNORMAL HIGH (ref 4.0–10.5)

## 2014-06-16 MED ORDER — METFORMIN HCL 500 MG PO TABS
1000.0000 mg | ORAL_TABLET | Freq: Every day | ORAL | Status: DC
  Administered 2014-06-16 – 2014-06-18 (×3): 1000 mg via ORAL
  Filled 2014-06-16 (×4): qty 2

## 2014-06-16 MED ORDER — SODIUM CHLORIDE 0.9 % IJ SOLN: 3.0000 mL | INTRAMUSCULAR | Status: DC | PRN

## 2014-06-16 MED ORDER — ONDANSETRON HCL 4 MG/2ML IJ SOLN: 4.0000 mg | Freq: Four times a day (QID) | INTRAMUSCULAR | Status: DC | PRN

## 2014-06-16 MED ORDER — LINAGLIPTIN 5 MG PO TABS
5.0000 mg | ORAL_TABLET | Freq: Every day | ORAL | Status: DC
  Administered 2014-06-16 – 2014-06-18 (×3): 5 mg via ORAL
  Filled 2014-06-16 (×4): qty 1

## 2014-06-16 MED ORDER — POTASSIUM CHLORIDE CRYS ER 20 MEQ PO TBCR
40.0000 meq | EXTENDED_RELEASE_TABLET | Freq: Every day | ORAL | Status: AC
  Administered 2014-06-16 – 2014-06-18 (×3): 40 meq via ORAL
  Filled 2014-06-16 (×4): qty 2

## 2014-06-16 MED ORDER — FUROSEMIDE 40 MG PO TABS
40.0000 mg | ORAL_TABLET | Freq: Every day | ORAL | Status: AC
  Administered 2014-06-16 – 2014-06-18 (×3): 40 mg via ORAL
  Filled 2014-06-16 (×3): qty 1

## 2014-06-16 MED ORDER — TRAMADOL HCL 50 MG PO TABS
50.0000 mg | ORAL_TABLET | ORAL | Status: DC | PRN
  Administered 2014-06-17: 50 mg via ORAL
  Filled 2014-06-16: qty 1

## 2014-06-16 MED ORDER — ACETAMINOPHEN 325 MG PO TABS
650.0000 mg | ORAL_TABLET | Freq: Four times a day (QID) | ORAL | Status: DC | PRN
Start: 2014-06-16 — End: 2014-06-18

## 2014-06-16 MED ORDER — INSULIN ASPART 100 UNIT/ML ~~LOC~~ SOLN
0.0000 [IU] | Freq: Three times a day (TID) | SUBCUTANEOUS | Status: DC
  Administered 2014-06-16 (×2): 4 [IU] via SUBCUTANEOUS
  Administered 2014-06-17 – 2014-06-18 (×6): 2 [IU] via SUBCUTANEOUS

## 2014-06-16 MED ORDER — PANTOPRAZOLE SODIUM 40 MG PO TBEC
40.0000 mg | DELAYED_RELEASE_TABLET | Freq: Every day | ORAL | Status: DC
  Administered 2014-06-16 – 2014-06-18 (×3): 40 mg via ORAL
  Filled 2014-06-16 (×3): qty 1

## 2014-06-16 MED ORDER — DOCUSATE SODIUM 100 MG PO CAPS
200.0000 mg | ORAL_CAPSULE | Freq: Every day | ORAL | Status: DC
  Administered 2014-06-16 – 2014-06-18 (×3): 200 mg via ORAL
  Filled 2014-06-16 (×3): qty 2

## 2014-06-16 MED ORDER — ONDANSETRON HCL 4 MG PO TABS: 4.0000 mg | ORAL_TABLET | Freq: Four times a day (QID) | ORAL | Status: DC | PRN

## 2014-06-16 MED ORDER — MOVING RIGHT ALONG BOOK
Freq: Once | Status: AC
  Administered 2014-06-16: 09:00:00
  Filled 2014-06-16: qty 1

## 2014-06-16 MED ORDER — SODIUM CHLORIDE 0.9 % IJ SOLN
3.0000 mL | Freq: Two times a day (BID) | INTRAMUSCULAR | Status: DC
  Administered 2014-06-16 – 2014-06-18 (×5): 3 mL via INTRAVENOUS

## 2014-06-16 MED ORDER — POTASSIUM CHLORIDE 10 MEQ/50ML IV SOLN
10.0000 meq | INTRAVENOUS | Status: DC | PRN
  Administered 2014-06-16 (×2): 10 meq via INTRAVENOUS
  Filled 2014-06-16: qty 50

## 2014-06-16 MED ORDER — POTASSIUM CHLORIDE 10 MEQ/50ML IV SOLN
10.0000 meq | Freq: Once | INTRAVENOUS | Status: AC
  Administered 2014-06-16: 10 meq via INTRAVENOUS

## 2014-06-16 MED ORDER — SODIUM CHLORIDE 0.9 % IV SOLN: 250.0000 mL | INTRAVENOUS | Status: DC | PRN

## 2014-06-16 MED ORDER — SITAGLIPTIN PHOS-METFORMIN HCL 50-1000 MG PO TABS: 1.0000 | ORAL_TABLET | Freq: Two times a day (BID) | ORAL | Status: DC

## 2014-06-16 MED ORDER — OXYCODONE HCL 5 MG PO TABS: 5.0000 mg | ORAL_TABLET | ORAL | Status: DC | PRN

## 2014-06-16 NOTE — Progress Notes (Signed)
Transferred to 2W 38 via WC and monitor, placed in bed, SCD's with pt. NT in room to receive.

## 2014-06-16 NOTE — Progress Notes (Addendum)
CARDIAC REHAB PHASE I   PRE:  Rate/Rhythm: 99 SR  BP:  Supine:   Sitting: 100/60  Standing:    SaO2: 95% RA  MODE:  Ambulation: 350 ft   POST:  Rate/Rhythm: 106  BP:  Supine: 130/76 Sitting:   Standing:    SaO2: 97% RA  [id number] Assisted patient from chair to the restroom, then ambulated up in hall. Pt tolerated ambulation well with assist x 1 and pushing rolling walker. Gait steady, no c/o, VSS. To bed after walk, call bell within reach.   Sol Passer, MS, ACSM CCEP

## 2014-06-16 NOTE — Progress Notes (Signed)
Report to 2 W RN 

## 2014-06-16 NOTE — Plan of Care (Signed)
Problem: Phase III - Recovery through Discharge Goal: Discharge plan remains appropriate-arrangements made Outcome: Completed/Met Date Met:  06/16/14

## 2014-06-16 NOTE — Progress Notes (Signed)
2 Days Post-Op Procedure(s) (LRB): CORONARY ARTERY BYPASS GRAFTING (CABG) (N/A) TRANSESOPHAGEAL ECHOCARDIOGRAM (TEE) (N/A) Subjective:  No complaints  Objective: Vital signs in last 24 hours: Temp:  [97.7 F (36.5 C)-100.4 F (38 C)] 98.8 F (37.1 C) (11/04 0750) Pulse Rate:  [77-97] 82 (11/04 0800) Cardiac Rhythm:  [-] Normal sinus rhythm (11/04 0700) Resp:  [10-26] 16 (11/04 0800) BP: (98-151)/(47-108) 141/65 mmHg (11/04 0800) SpO2:  [90 %-100 %] 96 % (11/04 0800) Arterial Line BP: (115-176)/(68-82) 115/82 mmHg (11/03 0930) Weight:  [74.299 kg (163 lb 12.8 oz)] 74.299 kg (163 lb 12.8 oz) (11/04 0500)  Hemodynamic parameters for last 24 hours: PAP: (25-35)/(13-24) 25/14 mmHg  Intake/Output from previous day: 11/03 0701 - 11/04 0700 In: 1600.8 [P.O.:790; I.V.:510.8; IV Piggyback:300] Out: 4037 [Urine:3830; Chest Tube:5] Intake/Output this shift: Total I/O In: 50 [IV Piggyback:50] Out: 175 [Urine:175]  General appearance: alert and cooperative Neurologic: intact Heart: regular rate and rhythm, S1, S2 normal, no murmur, click, rub or gallop Lungs: clear to auscultation bilaterally Extremities: edema mild Wound: dressing dry  Lab Results:  Recent Labs  06/15/14 1635 06/16/14 0400  WBC 16.9* 14.3*  HGB 10.3* 9.6*  HCT 31.3* 29.5*  PLT 205 209   BMET:  Recent Labs  06/15/14 0400 06/15/14 1635 06/16/14 0400  NA 137  --  134*  K 3.9  --  3.3*  CL 104  --  97  CO2 22  --  27  GLUCOSE 114*  --  73  BUN 10  --  8  CREATININE 0.41* 0.49* 0.47*  CALCIUM 7.4*  --  7.7*    PT/INR:  Recent Labs  06/14/14 1130  LABPROT 19.1*  INR 1.59*   ABG    Component Value Date/Time   PHART 7.374 06/14/2014 1917   HCO3 25.7* 06/14/2014 1917   TCO2 20 06/14/2014 2139   ACIDBASEDEF 1.0 06/14/2014 1736   O2SAT 99.0 06/14/2014 1917   CBG (last 3)   Recent Labs  06/15/14 1532 06/15/14 1927 06/15/14 2340  GLUCAP 184* 248* 81   CXR: improved atelectasis  bilaterally  Assessment/Plan: S/P Procedure(s) (LRB): CORONARY ARTERY BYPASS GRAFTING (CABG) (N/A) TRANSESOPHAGEAL ECHOCARDIOGRAM (TEE) (N/A)  She is doing very well Mobilize Diuresis Diabetes control Plan for transfer to step-down: see transfer orders   LOS: 2 days    BARTLE,BRYAN K 06/16/2014

## 2014-06-16 NOTE — Plan of Care (Signed)
Problem: Phase II - Intermediate Post-Op Goal: CBGs/Blood Glucose per SCIP Criteria Outcome: Completed/Met Date Met:  06/16/14 Goal: Advance Diet Outcome: Completed/Met Date Met:  06/16/14 Goal: Activity Progressed Outcome: Completed/Met Date Met:  06/16/14 Goal: Patient advanced to Phase III: Barriers addressed Outcome: Completed/Met Date Met:  06/16/14  Problem: Phase III - Recovery through Discharge Goal: Maintain Hemodynamic Stability Outcome: Completed/Met Date Met:  06/16/14

## 2014-06-17 ENCOUNTER — Ambulatory Visit: Payer: Medicare Other | Admitting: Physician Assistant

## 2014-06-17 LAB — BASIC METABOLIC PANEL
Anion gap: 15 (ref 5–15)
BUN: 8 mg/dL (ref 6–23)
CO2: 23 mEq/L (ref 19–32)
Calcium: 8.4 mg/dL (ref 8.4–10.5)
Chloride: 95 mEq/L — ABNORMAL LOW (ref 96–112)
Creatinine, Ser: 0.43 mg/dL — ABNORMAL LOW (ref 0.50–1.10)
GFR calc Af Amer: >90 mL/min (ref >90)
GFR calc non Af Amer: >90 mL/min (ref >90)
Glucose, Bld: 132 mg/dL — ABNORMAL HIGH (ref 70–99)
Potassium: 3.7 mEq/L (ref 3.7–5.3)
Sodium: 133 mEq/L — ABNORMAL LOW (ref 137–147)

## 2014-06-17 LAB — GLUCOSE, CAPILLARY
Glucose-Capillary: 77 mg/dL (ref 70–99)
Glucose-Capillary: 133 mg/dL — ABNORMAL HIGH (ref 70–99)
Glucose-Capillary: 137 mg/dL — ABNORMAL HIGH (ref 70–99)
Glucose-Capillary: 139 mg/dL — ABNORMAL HIGH (ref 70–99)
Glucose-Capillary: 143 mg/dL — ABNORMAL HIGH (ref 70–99)

## 2014-06-17 MED ORDER — CARVEDILOL 25 MG PO TABS
25.0000 mg | ORAL_TABLET | Freq: Two times a day (BID) | ORAL | Status: DC
Start: 2014-06-17 — End: 2014-06-18
  Administered 2014-06-17 – 2014-06-18 (×2): 25 mg via ORAL
  Filled 2014-06-17 (×5): qty 1

## 2014-06-17 NOTE — Discharge Instructions (Signed)
Endoscopic Saphenous Vein Harvesting °Care After °Refer to this sheet in the next few weeks. These instructions provide you with information on caring for yourself after your procedure. Your health care provider may also give you more specific instructions. Your treatment has been planned according to current medical practices, but problems sometimes occur. Call your health care provider if you have any problems or questions after your procedure. °HOME CARE INSTRUCTIONS °Medicine °· Take whatever pain medicine your surgeon prescribes. Follow the directions carefully. Do not take over-the-counter pain medicine unless your surgeon says it is okay. Some pain medicine can cause bleeding problems for several weeks after surgery. °· Follow your surgeon's instructions about driving. You will probably not be permitted to drive after heart surgery. °· Take any medicines your surgeon prescribes. Any medicines you took before your heart surgery should be checked with your health care provider before you start taking them again. °Wound care °· If your surgeon has prescribed an elastic bandage or stocking, ask how long you should wear it. °· Check the area around your surgical cuts (incisions) whenever your bandages (dressings) are changed. Look for any redness or swelling. °· You will need to return to have the stitches (sutures) or staples taken out. Ask your surgeon when to do that. °· Ask your surgeon when you can shower or bathe. °Activity °· Try to keep your legs raised when you are sitting. °· Do any exercises your health care providers have given you. These may include deep breathing exercises, coughing, walking, or other exercises. °SEEK MEDICAL CARE IF: °· You have any questions about your medicines. °· You have more leg pain, especially if your pain medicine stops working. °· New or growing bruises develop on your leg. °· Your leg swells, feels tight, or becomes red. °· You have numbness in your leg. °SEEK IMMEDIATE  MEDICAL CARE IF: °· Your pain gets much worse. °· Blood or fluid leaks from any of the incisions. °· Your incisions become warm, swollen, or red. °· You have chest pain. °· You have trouble breathing. °· You have a fever. °· You have more pain near your leg incision. °MAKE SURE YOU: °· Understand these instructions. °· Will watch your condition. °· Will get help right away if you are not doing well or get worse. °Document Released: 04/11/2011 Document Revised: 08/04/2013 Document Reviewed: 04/11/2011 °ExitCare® Patient Information ©2015 [employer]. This information is not intended to replace advice given to you by your health care provider. Make sure you discuss any questions you have with your health care provider. °Coronary Artery Bypass Grafting, Care After °These instructions give you information on caring for yourself after your procedure. Your doctor may also give you more specific instructions. Call your doctor if you have any problems or questions after your procedure.  °HOME CARE °· Only take medicine as told by your doctor. Take medicines exactly as told. Do not stop taking medicines or start any new medicines without talking to your doctor first. °· Take your pulse as told by your doctor. °· Do deep breathing as told by your doctor. Use your breathing device (incentive spirometer), if given, to practice deep breathing several times a day. Support your chest with a pillow or your arms when you take deep breaths or cough. °· Keep the area clean, dry, and protected where the surgery cuts (incisions) were made. Remove bandages (dressings) only as told by your doctor. If strips were applied to surgical area, do not take them off. They fall off   on their own. °· Check the surgery area daily for puffiness (swelling), redness, or leaking fluid. °· If surgery cuts were made in your legs: °· Avoid crossing your legs. °· Avoid sitting for long periods of time. Change positions every 30 minutes. °· Raise your legs  when you are sitting. Place them on pillows. °· Wear stockings that help keep blood clots from forming in your legs (compression stockings). °· Only take sponge baths until your doctor says it is okay to take showers. Pat the surgery area dry. Do not rub the surgery area with a washcloth or towel. Do not bathe, swim, or use a hot tub until your doctor says it is okay. °· Eat foods that are high in fiber. These include raw fruits and vegetables, whole grains, beans, and nuts. Choose lean meats. Avoid canned, processed, and fried foods. °· Drink enough fluids to keep your pee (urine) clear or pale yellow. °· Weigh yourself every day. °· Rest and limit activity as told by your doctor. You may be told to: °· Stop any activity if you have chest pain, shortness of breath, changes in heartbeat, or dizziness. Get help right away if this happens. °· Move around often for short amounts of time or take short walks as told by your doctor. Gradually become more active. You may need help to strengthen your muscles and build endurance. °· Avoid lifting, pushing, or pulling anything heavier than 10 pounds (4.5 kg) for at least 6 weeks after surgery. °· Do not drive until your doctor says it is okay. °· Ask your doctor when you can go back to work. °· Ask your doctor when you can begin sexual activity again. °· Follow up with your doctor as told. °GET HELP IF: °· You have puffiness, redness, more pain, or fluid draining from the incision site. °· You have a fever. °· You have puffiness in your ankles or legs. °· You have pain in your legs. °· You gain 2 or more pounds (0.9 kg) a day. °· You feel sick to your stomach (nauseous) or throw up (vomit). °· You have watery poop (diarrhea). °GET HELP RIGHT AWAY IF: °· You have chest pain that goes to your jaw or arms. °· You have shortness of breath. °· You have a fast or irregular heartbeat. °· You notice a "clicking" in your breastbone when you move. °· You have numbness or weakness in  your arms or legs. °· You feel dizzy or light-headed. °MAKE SURE YOU: °· Understand these instructions. °· Will watch your condition. °· Will get help right away if you are not doing well or get worse. °Document Released: 08/04/2013 Document Reviewed: 08/04/2013 °ExitCare® Patient Information ©2015 [employer]. This information is not intended to replace advice given to you by your health care provider. Make sure you discuss any questions you have with your health care provider. ° °

## 2014-06-17 NOTE — Discharge Summary (Signed)
Physician Discharge Summary  Patient ID: Susan Barnett MRN: [id number] DOB/AGE: 1945-12-21 68 y.o.  Admit date: 06/14/2014 Discharge date: 06/17/2014  Admission Diagnoses: severe coronary artery disease   HPI:At time of consultation  The patient is a 68 year old woman with a history of hypertension, hypothyroidism, diabetes and know coronary artery disease who has undergone bare metal stenting of the RCA in 2002 with restenosis treated with brachytherapy. She developed edge restenosis and was treated with a DES and then developed restenosis between the stents treated with angioplasty and stenting with a long DES in 2006. She subsequently developed chest pain and had in-stent restenosis treated with an Angioscore balloon in 2012. She now presents with progressive angina over the last 6 months. Cath shows severe in-stent restenosis in the mid to distal RCA up to 95%. This extends almost to the takeoff of the PDA. A PCI was attempted but the region was not able to be dilated with a conventional balloon and scoring balloons could not be advanced into the region. IVUS demonstrated concentric calcification. There was also a 70-80% stenosis of the proximal small to moderate first diagonal and the proximal small OM1. Due to these findings, cardiothoracic surgical consultation was obtained with Dr. Cyndia Bent who evaluated the patient and her studies. He recommended coronary artery surgical revascularization, and she was admitted for the procedure.    Past Medical History  Diagnosis Date  . Hypertension   . Gastric polyp   . Rectal bleeding   . Hypothyroidism   . Heartburn   . Sleep apnea     cpap used nightly x 2 yrs now.  . Occasional tremors     "of head", slight hands  . Hiatal hernia     mild head tremors  . Anemia     mild  . CAD (coronary artery disease)     Dr. Linard Millers follows, no problems now   . GERD (gastroesophageal reflux disease)   . Complication of anesthesia   . PONV (postoperative nausea and vomiting)   . Anginal pain   . Diabetes mellitus, type 2     TYPE 2    Past Surgical History  Procedure Laterality Date  . Coronary stents      x3 stents placed-prior ablation  . Dilation and curettage of uterus    . Elbow fracture surgery Left   . Incontinence surgery    . Knee arthroscopy Right 02/24/2014    Procedure: RIGHT KNEE ARTHROSCOPY WITH DEBRIDEMENT ; Surgeon: Gearlean Alf, MD; Location: [facility]; Service: Orthopedics; Laterality: Right;  . Cardiac catheterization  06/02/2014    LAD 30%, D1 80%, CFX 755, RCA > 95% ISR, rx w/ PTCA, heavy calcification, EF 65%    Family History  Problem Relation Age of Onset  . Heart attack Mother     Social History History  Substance Use Topics  . Smoking status: Never Smoker   . Smokeless tobacco: Never Used  . Alcohol Use: No    Current Outpatient Prescriptions  Medication Sig Dispense Refill  . amLODipine (NORVASC) 5 MG tablet Take 5 mg by mouth every morning.     Marland Kitchen atorvastatin (LIPITOR) 10 MG tablet Take 10 mg by mouth daily.    . carvedilol (COREG) 25 MG tablet Take 25 mg by mouth 2 (two) times daily.    . cetirizine (ZYRTEC) 10 MG tablet Take 10 mg by mouth daily.    . cloNIDine (CATAPRES) 0.1 MG tablet Take 0.1 mg by mouth at bedtime.     Marland Kitchen  clopidogrel (PLAVIX) 75 MG tablet Take 75 mg by mouth daily.    . Coenzyme Q10 (CO Q 10) 100 MG CAPS Take 300 mg by mouth 2 (two) times daily.    . diphenhydramine-acetaminophen (TYLENOL PM) 25-500 MG TABS Take 1 tablet by mouth at bedtime as needed (for sleep or pain).     . Exenatide ER (BYDUREON) 2 MG SUSR Inject  2 mg into the skin once a week. Sundays    . levothyroxine (SYNTHROID, LEVOTHROID) 75 MCG tablet Take 75 mcg by mouth every morning.    Marland Kitchen losartan-hydrochlorothiazide (HYZAAR) 100-25 MG per tablet Take 1 tablet by mouth every morning.     . Multiple Vitamins-Minerals (MULTIVITAMIN PO) Take 1 tablet by mouth daily.    . nitroGLYCERIN (NITROSTAT) 0.4 MG SL tablet Place 0.4 mg under the tongue every 5 (five) minutes as needed for chest pain (MA X 3 TABLETS).     . pantoprazole (PROTONIX) 40 MG tablet Take 1 tablet (40 mg total) by mouth daily before breakfast. 30 tablet 11  . potassium chloride SA (K-DUR,KLOR-CON) 20 MEQ tablet Take 20 mEq by mouth daily.    . sitaGLIPtin-metformin (JANUMET) 50-1000 MG per tablet Take 1 tablet by mouth 2 (two) times daily. Hold for 48 hours, restart on 06/05/2014     No current facility-administered medications for this visit.    Allergies  Allergen Reactions  . Asa [Aspirin] Anaphylaxis  . Compazine [Prochlorperazine Edisylate] Swelling and Other (See Comments)    Tongue swelling   . Nsaids Hives and Swelling  . Shellfish Allergy Hives and Swelling  . Sulfa Antibiotics Hives and Swelling  . Zantac [Ranitidine Hcl] Hives and Swelling     Discharge Diagnoses:  Active Problems:   S/P CABG x 2   Patient Active Problem List   Diagnosis Date Noted  . S/P CABG x 2 06/14/2014  . Angina pectoris, crescendo 06/02/2014  . Lateral meniscal tear 02/23/2014  . GASTRIC POLYP 11/19/2007  . HYPOTHYROIDISM 11/19/2007  . Type 2 diabetes mellitus with complications 94/17/4081  . Essential hypertension 11/19/2007  . Coronary atherosclerosis 11/19/2007  . GERD 11/19/2007  . HIATAL HERNIA 11/19/2007  . RECTAL BLEEDING 11/19/2007  . HEARTBURN 11/19/2007    Discharged Condition: good  Hospital Course: the patient was  admitted electively and on 06/14/2014 was taken to the operating room at which time she underwent the following procedure:    CARDIOVASCULAR SURGERY OPERATIVE NOTE  06/14/2014  Surgeon: Gaye Pollack, MD  First Assistant: Jadene Pierini, PA-C   Preoperative Diagnosis: Severe multi-vessel coronary artery disease   Postoperative Diagnosis: Same   Procedure:  1. Median Sternotomy 2. Extracorporeal circulation 3. Coronary artery bypass grafting x 2   SVG to diagonal  SVG to PDA  4. Endoscopic vein harvest from the right leg   Anesthesia: General Endotracheal The patient was then transported to the surgical intensive care unit in critical but stable condition.  Postoperative hospital course:  Overall the patient has progressed quite nicely. She has remained hemodynamically stable in sinus rhythm. She was weaned from the ventilator without difficulty using standard protocols. She has remained neurologically intact. All routine lines, monitors and drainage devices have been discontinued using standard protocols. She is tolerating gradually increasing activities using standard cardiac rehabilitation protocols. Oxygen has been weaned and she maintains good saturations on room air. Incisions are noted to be healing well without evidence of infection.she has a mild acute blood loss anemia which has stabilized. She has had some mild postoperative  volume overload which has responded well to diuretics and BUN/creatinine are in the normal range. She did have some postoperative tachycardia and Coreg has been advanced to her preoperative dose. Her overall status is felt to be tentatively stable for discharge in the next 24-48 hours pending ongoing reevaluation of her recovery. -  Consults: None Discharge Exam: Blood pressure 151/82, pulse 110, temperature 99.3 F (37.4 C), temperature source  Oral, resp. rate 18, height 5\' 2"  (1.575 m), weight 157 lb 9.6 oz (71.487 kg), SpO2 95 %.   General appearance: alert, cooperative and no distress Heart: regular rate and rhythm Lungs: mildly dim in the bases Abdomen: benign exam Extremities: minimal edemqa Wound: incis healing well Disposition: 01-Home or Self Care  medications at time of discharge:   Medication List    STOP taking these medications        cloNIDine 0.1 MG tablet  Commonly known as:  CATAPRES     losartan-hydrochlorothiazide 100-12.5 MG per tablet  Commonly known as:  HYZAAR     nitroGLYCERIN 0.4 MG SL tablet  Commonly known as:  NITROSTAT     potassium chloride SA 20 MEQ tablet  Commonly known as:  K-DUR,KLOR-CON      TAKE these medications        amLODipine 5 MG tablet  Commonly known as:  NORVASC  Take 5 mg by mouth every morning.     atorvastatin 10 MG tablet  Commonly known as:  LIPITOR  Take 10 mg by mouth daily.     carvedilol 25 MG tablet  Commonly known as:  COREG  Take 25 mg by mouth 2 (two) times daily.     cetirizine 10 MG tablet  Commonly known as:  ZYRTEC  Take 10 mg by mouth daily.     clopidogrel 75 MG tablet  Commonly known as:  PLAVIX  Take 75 mg by mouth daily.     Co Q 10 100 MG Caps  Take 300 mg by mouth 2 (two) times daily.     diphenhydramine-acetaminophen 25-500 MG Tabs  Commonly known as:  TYLENOL PM  Take 1 tablet by mouth at bedtime as needed (for sleep or pain).     docusate sodium 50 MG capsule  Commonly known as:  COLACE  Take 50 mg by mouth daily as needed for mild constipation.     Exenatide ER 2 MG Susr  Commonly known as:  BYDUREON  Inject 2 mg into the skin once a week. Sundays     levothyroxine 75 MCG tablet  Commonly known as:  SYNTHROID, LEVOTHROID  Take 75 mcg by mouth every morning.     MULTIVITAMIN PO  Take 1 tablet by mouth daily.     oxyCODONE 5 MG immediate release tablet  Commonly known as:  Oxy IR/ROXICODONE  Take 1-2 tablets  (5-10 mg total) by mouth every 4 (four) hours as needed for severe pain.     pantoprazole 40 MG tablet  Commonly known as:  PROTONIX  Take 1 tablet (40 mg total) by mouth daily before breakfast.     sitaGLIPtin-metformin 50-1000 MG per tablet  Commonly known as:  JANUMET  Take 1 tablet by mouth 2 (two) times daily. Hold for 48 hours, restart on 06/05/2014       Follow-up Information    Follow up with Gaye Pollack, MD.   Specialty:  Cardiothoracic Surgery   Why:  07/14/2014 at 1 PM to see the surgeon. Please obtain a chest x-ray at [facility]  at noon. [facility] is located in the same office complex as the Psychologist, sport and exercise.   Contact information:   [address] [place] [address] [phone]       Follow up with Richardson Dopp, PA-C.   Specialty:  Physician Assistant   Why:  07/12/14 at 8:30 am for cardiology follow-up   Contact information:   [address] [place] [address] [phone]       Follow up with [facility].   Why:  06/25/2014 at 10 AM to see the nurse in Dr. Vivi Martens office for suture removal.     The patient has been discharged on:   1.Beta Blocker:  Yes Blue.Reese   ]                              No   [   ]                              If No, reason:  2.Ace Inhibitor/ARB: Yes [   ]                                     No  [  n  ]                                     If No, reason:bp will not tolerate currently , poss start as outpatient  3.Statin:   Yes [ y  ]                  No  [   ]                  If No, reason:  4.Ecasa:  Yes  [   ]                  No   [ n  ]                  If No, reason:on plavix- also allergic  Signed: GOLD,WAYNE E 06/17/2014, 8:58 AM

## 2014-06-17 NOTE — Progress Notes (Addendum)
[address]       [place] [address]             [phone]      3 Days Post-Op Procedure(s) (LRB): CORONARY ARTERY BYPASS GRAFTING (CABG) (N/A) TRANSESOPHAGEAL ECHOCARDIOGRAM (TEE) (N/A) Subjective: Looks and feels well  Objective: Vital signs in last 24 hours: Temp:  [98.2 F (36.8 C)-99.3 F (37.4 C)] 99.3 F (37.4 C) (11/05 0442) Pulse Rate:  [92-110] 110 (11/05 0442) Cardiac Rhythm:  [-] Sinus tachycardia (11/05 0806) Resp:  [17-20] 18 (11/05 0442) BP: (104-151)/(62-82) 151/82 mmHg (11/05 0806) SpO2:  [94 %-97 %] 95 % (11/05 0442) FiO2 (%):  [24 %] 24 % (11/04 0841) Weight:  [157 lb 9.6 oz (71.487 kg)] 157 lb 9.6 oz (71.487 kg) (11/05 0442)  Hemodynamic parameters for last 24 hours:    Intake/Output from previous day: 11/04 0701 - 11/05 0700 In: 820 [P.O.:720; IV Piggyback:100] Out: 3550 [Urine:3550] Intake/Output this shift:    General appearance: alert, cooperative and no distress Heart: regular rate and rhythm and tachy Lungs: dim in the bases Abdomen: benign Extremities: no sig edema Wound: incis healing  Lab Results:  Recent Labs  06/15/14 1635 06/16/14 0400  WBC 16.9* 14.3*  HGB 10.3* 9.6*  HCT 31.3* 29.5*  PLT 205 209   BMET:  Recent Labs  06/16/14 0400 06/17/14 0401  NA 134* 133*  K 3.3* 3.7  CL 97 95*  CO2 27 23  GLUCOSE 73 132*  BUN 8 8  CREATININE 0.47* 0.43*  CALCIUM 7.7* 8.4    PT/INR:  Recent Labs  06/14/14 1130  LABPROT 19.1*  INR 1.59*   ABG    Component Value Date/Time   PHART 7.374 06/14/2014 1917   HCO3 25.7* 06/14/2014 1917   TCO2 20 06/14/2014 2139   ACIDBASEDEF 1.0 06/14/2014 1736   O2SAT 99.0 06/14/2014 1917   CBG (last 3)   Recent Labs  06/16/14 1650 06/16/14 2035 06/17/14 0625  GLUCAP 122* 166* 133*    Meds Scheduled Meds: . carvedilol  12.5 mg Oral BID WC  . clopidogrel  75 mg Oral Daily  . docusate sodium  200 mg Oral Daily  . enoxaparin (LOVENOX) injection  40  mg Subcutaneous QHS  . furosemide  40 mg Oral Daily  . insulin aspart  0-24 Units Subcutaneous TID AC & HS  . levothyroxine  75 mcg Oral QAC breakfast  . linagliptin  5 mg Oral Q breakfast   And  . metFORMIN  1,000 mg Oral Q breakfast  . pantoprazole  40 mg Oral QAC breakfast  . potassium chloride  40 mEq Oral Daily  . sodium chloride  3 mL Intravenous Q12H   Continuous Infusions:  PRN Meds:.sodium chloride, acetaminophen, ondansetron **OR** ondansetron (ZOFRAN) IV, oxyCODONE, sodium chloride, traMADol  Xrays Dg Chest Port 1 View  06/16/2014   CLINICAL DATA:  CABG  EXAM: PORTABLE CHEST - 1 VIEW  COMPARISON:  06/15/2014  FINDINGS: Swan-Ganz catheter has been removed. Right jugular sheath remains in the SVC. No pneumothorax. Mediastinal and pericardial drains have been removed.  Decreased lung volume with bibasilar atelectasis unchanged from yesterday.  IMPRESSION: Bibasilar atelectasis unchanged. Negative for heart failure or pneumothorax.   Electronically Signed   By: Franchot Gallo M.D.   On: 06/16/2014 08:14    Assessment/Plan: S/P Procedure(s) (LRB): CORONARY ARTERY BYPASS GRAFTING (CABG) (N/A) TRANSESOPHAGEAL ECHOCARDIOGRAM (TEE) (N/A)  1 doing well 2 increase coreg to home dose for BP/HR elevation, may  need to restart home norvasc if BP remains elevated 3 gentle diuresis 4 sugars controlled 5 pulm toilet for atelectasis 6 routine rehab 7 going home with husband/family assisting at discharge   LOS: 3 days    GOLD,WAYNE E 06/17/2014   Chart reviewed, patient examined, agree with above. She looks great. Pain under control. Coreg increased back to 25 bid.  She can go home tomorrow if he bowels move and no further issues.

## 2014-06-17 NOTE — Progress Notes (Signed)
CARDIAC REHAB PHASE I   PRE:  Rate/Rhythm: 98 SR    BP: sitting 132/62    SaO2: 95 RA  MODE:  Ambulation: 890 ft   POST:  Rate/Rhythm: 108 ST    BP: sitting 126/64     SaO2: 99 RA  Tolerated very well. Steady with RW. Asked to practice getting in and out of bed. Able to do this almost independently. Husband present. No c/o. Has RW at home.  [id number]   Josephina Shih [place] CES, ACSM 06/17/2014 10:29 AM

## 2014-06-18 LAB — TYPE AND SCREEN
ABO/RH(D): A POS
Antibody Screen: NEGATIVE
Unit division: 0
Unit division: 0

## 2014-06-18 LAB — GLUCOSE, CAPILLARY
Glucose-Capillary: 122 mg/dL — ABNORMAL HIGH (ref 70–99)
Glucose-Capillary: 130 mg/dL — ABNORMAL HIGH (ref 70–99)

## 2014-06-18 MED ORDER — AMLODIPINE BESYLATE 5 MG PO TABS
5.0000 mg | ORAL_TABLET | Freq: Every morning | ORAL | Status: DC
  Administered 2014-06-18: 5 mg via ORAL
  Filled 2014-06-18 (×2): qty 1

## 2014-06-18 MED ORDER — OXYCODONE HCL 5 MG PO TABS: 5.0000 mg | ORAL_TABLET | ORAL | Status: DC | PRN

## 2014-06-18 NOTE — Progress Notes (Addendum)
      [address]       [place] [address]             [phone]      4 Days Post-Op Procedure(s) (LRB): CORONARY ARTERY BYPASS GRAFTING (CABG) (N/A) TRANSESOPHAGEAL ECHOCARDIOGRAM (TEE) (N/A) Subjective: Feels ok, no new issues, had a BM this am  Objective: Vital signs in last 24 hours: Temp:  [98.4 F (36.9 C)-99.1 F (37.3 C)] 98.4 F (36.9 C) (11/06 9326) Pulse Rate:  [95-108] 96 (11/06 0613) Cardiac Rhythm:  [-] Normal sinus rhythm (11/05 2030) Resp:  [16-18] 16 (11/06 0613) BP: (125-155)/(60-82) 155/75 mmHg (11/06 0613) SpO2:  [95 %] 95 % (11/06 0613) Weight:  [154 lb 12.8 oz (70.217 kg)] 154 lb 12.8 oz (70.217 kg) (11/06 7124)  Hemodynamic parameters for last 24 hours:    Intake/Output from previous day: 11/05 0701 - 11/06 0700 In: 480 [P.O.:480] Out: 3100 [Urine:3100] Intake/Output this shift:    General appearance: alert, cooperative and no distress Heart: regular rate and rhythm Lungs: mildly dim in the bases Abdomen: benign exam Extremities: minimal edemqa Wound: incis healing well  Lab Results:  Recent Labs  06/15/14 1635 06/16/14 0400  WBC 16.9* 14.3*  HGB 10.3* 9.6*  HCT 31.3* 29.5*  PLT 205 209   BMET:  Recent Labs  06/16/14 0400 06/17/14 0401  NA 134* 133*  K 3.3* 3.7  CL 97 95*  CO2 27 23  GLUCOSE 73 132*  BUN 8 8  CREATININE 0.47* 0.43*  CALCIUM 7.7* 8.4    PT/INR: No results for input(s): LABPROT, INR in the last 72 hours. ABG    Component Value Date/Time   PHART 7.374 06/14/2014 1917   HCO3 25.7* 06/14/2014 1917   TCO2 20 06/14/2014 2139   ACIDBASEDEF 1.0 06/14/2014 1736   O2SAT 99.0 06/14/2014 1917   CBG (last 3)   Recent Labs  06/17/14 1654 06/17/14 2048 06/18/14 0612  GLUCAP 139* 143* 122*    Meds Scheduled Meds: . carvedilol  25 mg Oral BID WC  . clopidogrel  75 mg Oral Daily  . docusate sodium  200 mg Oral Daily  . enoxaparin (LOVENOX) injection  40 mg Subcutaneous QHS  .  furosemide  40 mg Oral Daily  . insulin aspart  0-24 Units Subcutaneous TID AC & HS  . levothyroxine  75 mcg Oral QAC breakfast  . linagliptin  5 mg Oral Q breakfast   And  . metFORMIN  1,000 mg Oral Q breakfast  . pantoprazole  40 mg Oral QAC breakfast  . potassium chloride  40 mEq Oral Daily  . sodium chloride  3 mL Intravenous Q12H   Continuous Infusions:  PRN Meds:.sodium chloride, acetaminophen, ondansetron **OR** ondansetron (ZOFRAN) IV, oxyCODONE, sodium chloride, traMADol  Xrays No results found.  Assessment/Plan: S/P Procedure(s) (LRB): CORONARY ARTERY BYPASS GRAFTING (CABG) (N/A) TRANSESOPHAGEAL ECHOCARDIOGRAM (TEE) (N/A)  1 restart norvasc for HTN, was on multiple agents so will have to decide which to cont at discharge 2 d/c pacer wires 3 appears stable for discharge today     LOS: 4 days    GOLD,WAYNE E 06/18/2014   Chart reviewed, patient examined, agree with above. Plan home today. Resume Norvasc and when I see back in the office I will decide about the losartan and clonidine.

## 2014-06-18 NOTE — Progress Notes (Signed)
She has been provided with discharge instructions. RN went over instructions with the patient and answered all questions the patient had. She is being discharged home and pt is being transported home by her husband

## 2014-06-18 NOTE — Progress Notes (Signed)
Ed completed with pt and husband. Voiced understanding. Plans to attend [facility] and referral sent. [id number] [place], ACSM 10:11 AM 06/18/2014

## 2014-06-18 NOTE — Care Management Note (Signed)
    Page 1 of 1   06/18/2014     4:48:57 PM CARE MANAGEMENT NOTE 06/18/2014  Patient:  Susan Barnett, Susan Barnett   Account Number:  [id number]  Date Initiated:  06/17/2014  Documentation initiated by:  Jaceyon Strole  Subjective/Objective Assessment:   Pt s/p CABG x 2 on 06/14/14.  PTA, pt independent, lives with spouse.     Action/Plan:   Will follow for dc needs as pt progresses.   Anticipated DC Date:  06/18/2014   Anticipated DC Plan:  [place]  CM consult      Choice offered to / List presented to:             Status of service:  Completed, signed off Medicare Important Message given?  YES (If response is "NO", the following Medicare IM given date fields will be blank) Date Medicare IM given:  06/17/2014 Medicare IM given by:  Laretha Luepke Date Additional Medicare IM given:   Additional Medicare IM given by:    Discharge Disposition:  HOME/SELF CARE  Per UR Regulation:  Reviewed for med. necessity/level of care/duration of stay  If discussed at [place] of Stay Meetings, dates discussed:    Comments:

## 2014-06-18 NOTE — Progress Notes (Signed)
EPW removed intact and per protocol, pt tolerated well. Instructed bedrest for 1 hour, routine vital signs began 106/59 hr 97. Will monitor closely Susan Barnett A

## 2014-06-20 ENCOUNTER — Telehealth: Payer: Self-pay | Admitting: Physician Assistant

## 2014-06-20 ENCOUNTER — Other Ambulatory Visit: Payer: Self-pay | Admitting: Physician Assistant

## 2014-06-20 DIAGNOSIS — E860 Dehydration: Secondary | ICD-10-CM

## 2014-06-20 NOTE — Telephone Encounter (Signed)
Susan Barnett was recently discharged from the hospital after bypass surgery. Since discharge, she has noticed her heart rate being elevated, consistently greater than 100. Yesterday, she had a feeling of presyncope and was told to hold the low salt. She has done so it is no longer having presyncope.  She was having diarrhea since discharge, but that has improved.  Otherwise, she feels weak but feels like she is recovering well.  She is compliant with other medications including Coreg 25 mg twice a day. Her heart rate is 115 and she feels a little weak and short of breath but has not taken her evening dose yet. Her systolic blood pressure is 130. After her a.m. Dose of Coreg this morning, her heart rate dropped to 106 but no lower.  Advised her to take the evening dose of Coreg now. Advised her to try to get some rest tonight. Advised her I would have the office contact her tomorrow to come in for an office visit at the earliest possible time. Advised her that if her symptoms got any worse, she started to feel again like she was going to pass out or he was having other worsening problems, she should call 911 and come to the emergency room. She was in agreement.

## 2014-06-22 ENCOUNTER — Encounter: Payer: Medicare Other | Admitting: Physician Assistant

## 2014-06-25 ENCOUNTER — Ambulatory Visit (INDEPENDENT_AMBULATORY_CARE_PROVIDER_SITE_OTHER): Payer: Self-pay

## 2014-06-25 VITALS — BP 98/62 | HR 100 | Resp 20

## 2014-06-25 DIAGNOSIS — Z4802 Encounter for removal of sutures: Secondary | ICD-10-CM

## 2014-06-25 DIAGNOSIS — I25118 Atherosclerotic heart disease of native coronary artery with other forms of angina pectoris: Secondary | ICD-10-CM

## 2014-06-25 DIAGNOSIS — Z951 Presence of aortocoronary bypass graft: Secondary | ICD-10-CM

## 2014-06-25 NOTE — Progress Notes (Signed)
Removed 2 sutures from chest tube sites. No signs of infection. Patient became light headed and felt weak in the knees. Her v/s are normal. She thought it might be her blood sugar dropping. She was given cookies and water. After about 10 min's was feeling better. Was given wheelchair for transportation to vehicle.

## 2014-07-02 ENCOUNTER — Encounter: Payer: Self-pay | Admitting: Cardiology

## 2014-07-02 ENCOUNTER — Ambulatory Visit (INDEPENDENT_AMBULATORY_CARE_PROVIDER_SITE_OTHER): Payer: Medicare Other | Admitting: Cardiology

## 2014-07-02 VITALS — BP 110/70 | HR 106 | Ht 62.0 in | Wt 152.1 lb

## 2014-07-02 DIAGNOSIS — I10 Essential (primary) hypertension: Secondary | ICD-10-CM

## 2014-07-02 MED ORDER — METOPROLOL TARTRATE 50 MG PO TABS: 50.0000 mg | ORAL_TABLET | Freq: Two times a day (BID) | ORAL | Status: DC

## 2014-07-02 NOTE — Progress Notes (Signed)
07/02/2014 [name]   Aug 12, 1946  [id number]  Primary Physician Shirline Frees, MD Primary Cardiologist: Dr. Tamala Julian  HPI:  The patient is a 68 year old female, followed by Dr. Tamala Julian, with history of hypertension, hyperlipidemia, diabetes and CAD who presents to clinic today for post-hospital follow-up after undergoing recent coronary artery bypass surgery by Dr. Caffie Pinto on 06/14/2014. When CABG 2 with SVG to the diagonal vessel and SVG to the PDA. Her postoperative course was uncomplicated and she was discharged home on 06/17/2014.  Today in follow-up, she is accompanied by her husband. She states that she has been doing fairly well since discharge from the hospital. Her only concern is a rapid heart rate. She monitors her heart rate at home and states that she has had rates in the low 100s- 110s. She denies resting dyspnea but has dyspnea with exertion. She denies recurrent chest pain. No dizziness, lightheadedness, syncope/near-syncope. Her palpitations have felt regular in rhythm.   An office EKG today demonstrates sinus tachycardia with a ventricular rate of 106 bpm. Pressure is 110/40. Also of note, her losartan was discontinued during her hospitalization due to issues with hypotension. It should also be noted that her EF was recently assessed during hospitalization and was noted to be normal at 65%. She also has an aspirin allergy, but takes Plavix. She reports full medication compliance.  Current Outpatient Prescriptions  Medication Sig Dispense Refill  . amLODipine (NORVASC) 5 MG tablet Take 5 mg by mouth every morning.     Marland Kitchen atorvastatin (LIPITOR) 10 MG tablet Take 10 mg by mouth daily.    . carvedilol (COREG) 25 MG tablet Take 25 mg by mouth 2 (two) times daily.    . cetirizine (ZYRTEC) 10 MG tablet Take 10 mg by mouth daily.    . clopidogrel (PLAVIX) 75 MG tablet Take 75 mg by mouth daily.    . Coenzyme Q10 (CO Q 10) 100 MG CAPS Take 300 mg by mouth 2 (two) times daily.      . diphenhydramine-acetaminophen (TYLENOL PM) 25-500 MG TABS Take 1 tablet by mouth at bedtime as needed (for sleep or pain).     Marland Kitchen docusate sodium (COLACE) 50 MG capsule Take 50 mg by mouth daily as needed for mild constipation.    . Exenatide ER (BYDUREON) 2 MG SUSR Inject 2 mg into the skin once a week. Sundays    . levothyroxine (SYNTHROID, LEVOTHROID) 75 MCG tablet Take 75 mcg by mouth every morning.    . Multiple Vitamins-Minerals (MULTIVITAMIN PO) Take 1 tablet by mouth daily.    . pantoprazole (PROTONIX) 40 MG tablet Take 1 tablet (40 mg total) by mouth daily before breakfast. 30 tablet 11  . sitaGLIPtin-metformin (JANUMET) 50-1000 MG per tablet Take 1 tablet by mouth 2 (two) times daily. Hold for 48 hours, restart on 06/05/2014    . oxyCODONE (OXY IR/ROXICODONE) 5 MG immediate release tablet Take 1-2 tablets (5-10 mg total) by mouth every 4 (four) hours as needed for severe pain. 50 tablet 0   No current facility-administered medications for this visit.    Allergies  Allergen Reactions  . Asa [Aspirin] Anaphylaxis  . Compazine [Prochlorperazine Edisylate] Swelling and Other (See Comments)    Tongue swelling   . Nsaids Hives and Swelling  . Shellfish Allergy Hives and Swelling  . Sulfa Antibiotics Hives and Swelling  . Zantac [Ranitidine Hcl] Hives and Swelling    History   Social History  . Marital Status: Married    Spouse  Name: N/A    Number of Children: N/A  . Years of Education: N/A   Occupational History  . Not on file.   Social History Main Topics  . Smoking status: Never Smoker   . Smokeless tobacco: Never Used  . Alcohol Use: No  . Drug Use: No  . Sexual Activity: Not Currently   Other Topics Concern  . Not on file   Social History Narrative     Review of Systems: General: negative for chills, fever, night sweats or weight changes.  Cardiovascular: negative for chest pain, dyspnea on exertion, edema, orthopnea, palpitations, paroxysmal nocturnal  dyspnea or shortness of breath Dermatological: negative for rash Respiratory: negative for cough or wheezing Urologic: negative for hematuria Abdominal: negative for nausea, vomiting, diarrhea, bright red blood per rectum, melena, or hematemesis Neurologic: negative for visual changes, syncope, or dizziness All other systems reviewed and are otherwise negative except as noted above.    Blood pressure 110/70, pulse 106, height 5\' 2"  (1.575 m), weight 152 lb 1.9 oz (69.001 kg).  General appearance: alert, cooperative and no distress Neck: no carotid bruit and no JVD Lungs: clear to auscultation bilaterally Heart: regular rate and rhythm, S1, S2 normal, no murmur, click, rub or gallop Extremities: no LEE Pulses: 2+ and symmetric Skin: warm and dry Neurologic: Grossly normal  EKG sinus tachycardia; HR 106 bpm  ASSESSMENT AND PLAN:   1. CAD: Status post CABG 2. She denies any recurrent anginal symptoms. Continue daily Plavix, statin and beta blocker.  2. Sinus tachycardia: We'll change beta blocker from Coreg to metoprolol for better rate control. She has been on 25 mg twice a day of Coreg. Will place on 50 mg of Lopressor twice a day.   3. Hypertension: Pt had issues with hypotension during hospitalization. Since she is also off of her losartan, patient has been instructed to monitor blood pressure closely with new medication change in BB as she will lose the added benefit that Coreg provides in regards to blood pressure. If she is noted to have persistently elevated blood pressures, we may need to restart her losartan.   4. Hyperlipidemia: Continue statin therapy.  5. Diabetes: Continue management per PCP.  PLAN  continue medical therapy as outlined in detail below. Follow-up with Dr. Tamala Julian in 6-8 weeks for reassessment.  Leta Bucklin, BRITTAINYPA-C 07/02/2014 12:12 PM

## 2014-07-02 NOTE — Patient Instructions (Signed)
Your physician has recommended you make the following change in your medication:  1. STOP COREG 2. START METOPROLOL TARTRATE 50 MG 1 TABLET TWICE DAILY  WAIT ABOUT 1 WEEK THEN MONITOR BP AND CALL IF BP STAYING ABOVE 140/90.Marland Kitchen [phone]  Your physician recommends that you schedule a follow-up appointment in: [place]. Tamala Julian

## 2014-07-12 ENCOUNTER — Telehealth: Payer: Self-pay | Admitting: Interventional Cardiology

## 2014-07-12 ENCOUNTER — Encounter: Payer: Medicare Other | Admitting: Physician Assistant

## 2014-07-12 NOTE — Telephone Encounter (Signed)
New message      Talk to a nurse regarding her bp

## 2014-07-12 NOTE — Telephone Encounter (Signed)
Pt reports that she was seen by Ann Maki on 11/20 her beta blocker was swithced from Carvedilol to Metoprolol 50mg  bid for better rate control.pt was adv to monitor her bp daily at home and call the office if bp is consistently elevated. Pt reports that she has been monitoring her bp. Pt has not been keeping a bp log, but it has been slightly elevated, she reports her bp today was 149/93.adv her to keep a bp log.pt is taking Amlodipine 5mg  as prescribed.pt was to d/c Clonidine 0.1mg  at discharge,but sts that she must has misunderstood and has continue taking. She also complains of pain in both hands below the elbows that started after she started Metoprolol. She sts that after bypass sx she was having numbness in both hands, her f/u with Dr.Bartle is scheduled for 12/2.pt adv that I will fwd Dr.Smith an update and call back with his recommendations.pt verbalized understanding .

## 2014-07-13 ENCOUNTER — Other Ambulatory Visit: Payer: Self-pay | Admitting: Surgery

## 2014-07-13 DIAGNOSIS — I200 Unstable angina: Secondary | ICD-10-CM

## 2014-07-14 ENCOUNTER — Encounter: Payer: Self-pay | Admitting: Surgery

## 2014-07-14 ENCOUNTER — Ambulatory Visit: Payer: Medicare Other | Admitting: Surgery

## 2014-07-14 ENCOUNTER — Ambulatory Visit (INDEPENDENT_AMBULATORY_CARE_PROVIDER_SITE_OTHER): Payer: Self-pay | Admitting: Surgery

## 2014-07-14 ENCOUNTER — Other Ambulatory Visit: Payer: Self-pay | Admitting: *Deleted

## 2014-07-14 ENCOUNTER — Ambulatory Visit
Admission: RE | Admit: 2014-07-14 | Discharge: 2014-07-14 | Disposition: A | Discharge status: Home / Self Care | Payer: Medicare Other | Source: Ambulatory Visit | Attending: Surgery | Admitting: Surgery

## 2014-07-14 VITALS — BP 127/74 | HR 108 | Resp 20 | Ht 62.0 in | Wt 152.0 lb

## 2014-07-14 DIAGNOSIS — I200 Unstable angina: Secondary | ICD-10-CM

## 2014-07-14 DIAGNOSIS — I2510 Atherosclerotic heart disease of native coronary artery without angina pectoris: Secondary | ICD-10-CM

## 2014-07-14 DIAGNOSIS — Z951 Presence of aortocoronary bypass graft: Secondary | ICD-10-CM

## 2014-07-14 DIAGNOSIS — M792 Neuralgia and neuritis, unspecified: Secondary | ICD-10-CM

## 2014-07-14 MED ORDER — GABAPENTIN 300 MG PO CAPS: 300.0000 mg | ORAL_CAPSULE | Freq: Two times a day (BID) | ORAL | Status: DC

## 2014-07-14 NOTE — Telephone Encounter (Signed)
Pt aware of Dr.Smith recommendation with verbal understanding. If the blood pressure continues to be elevated, we will revert back to her basic medical therapy which was taken prior to bypass surgery. For the time being, continue the current medical regimen but call if persistent blood pressure elevation above 145/90 mmHg.

## 2014-07-14 NOTE — Telephone Encounter (Signed)
If the blood pressure continues to be elevated, we will revert back to her basic medical therapy which was taken prior to bypass surgery. For the time being, continue the current medical regimen but call if persistent blood pressure elevation above 145/90 mmHg.

## 2014-07-15 ENCOUNTER — Encounter: Payer: Self-pay | Admitting: Surgery

## 2014-07-15 NOTE — Progress Notes (Signed)
HPI:  Patient returns for routine postoperative follow-up having undergone CABG x 2  on 06/14/2014. The patient's early postoperative recovery while in the hospital was notable for an uncomplicated postop course. Since hospital discharge the patient reports that she has been walking without chest pain or shortness of breath. Her only complaint is of pain in both forearms and hands. She noticed some numbness along the ulnar portion of both hands in the hospital but has developed pain in the ulnar distribution since going home. She says that she has some numbness and tingling in the morning but as the day goes on she develops fairly severe pain in the same ulnar distribution from the elbow down. Oxycodone does not relieve it.    Current Outpatient Prescriptions  Medication Sig Dispense Refill  . amLODipine (NORVASC) 5 MG tablet Take 5 mg by mouth every morning.     Marland Kitchen atorvastatin (LIPITOR) 10 MG tablet Take 10 mg by mouth daily.    . cetirizine (ZYRTEC) 10 MG tablet Take 10 mg by mouth daily.    . clopidogrel (PLAVIX) 75 MG tablet Take 75 mg by mouth daily.    . Coenzyme Q10 (CO Q 10) 100 MG CAPS Take 300 mg by mouth 2 (two) times daily.    . diphenhydramine-acetaminophen (TYLENOL PM) 25-500 MG TABS Take 1 tablet by mouth at bedtime as needed (for sleep or pain).     Marland Kitchen docusate sodium (COLACE) 50 MG capsule Take 50 mg by mouth daily as needed for mild constipation.    . Exenatide ER (BYDUREON) 2 MG SUSR Inject 2 mg into the skin once a week. Sundays    . levothyroxine (SYNTHROID, LEVOTHROID) 75 MCG tablet Take 75 mcg by mouth every morning.    . metoprolol (LOPRESSOR) 50 MG tablet Take 1 tablet (50 mg total) by mouth 2 (two) times daily. 60 tablet 11  . Multiple Vitamins-Minerals (MULTIVITAMIN PO) Take 1 tablet by mouth daily.    Marland Kitchen oxyCODONE (OXY IR/ROXICODONE) 5 MG immediate release tablet Take 1-2 tablets (5-10 mg total) by mouth every 4 (four) hours as needed for severe pain. 50 tablet  0  . pantoprazole (PROTONIX) 40 MG tablet Take 1 tablet (40 mg total) by mouth daily before breakfast. 30 tablet 11  . sitaGLIPtin-metformin (JANUMET) 50-1000 MG per tablet Take 1 tablet by mouth 2 (two) times daily. Hold for 48 hours, restart on 06/05/2014    . gabapentin (NEURONTIN) 300 MG capsule Take 1 capsule (300 mg total) by mouth 2 (two) times daily. 60 capsule 1   No current facility-administered medications for this visit.    Physical Exam: BP 127/74 mmHg  Pulse 108  Resp 20  Ht 5\' 2"  (1.575 m)  Wt 152 lb (68.947 kg)  BMI 27.79 kg/m2  SpO2 98% She looks well Lungs are clear Cardiac exam shows a regular rate and rhythm with normal heart sounds. The chest incision is healing well and the sternum is stable. The leg incisions are healing well and there is no significant edema. She has good grip strength bilaterally.  Diagnostic Tests:  CLINICAL DATA: Angina. CABG. Diabetes.  EXAM: CHEST 2 VIEW  COMPARISON: 06/16/2014  FINDINGS: Prior median sternotomy. Mid thoracic spondylosis. Midline trachea. Normal heart size with a tortuous thoracic aorta. No pleural effusion or pneumothorax. Mild right hemidiaphragm elevation. Removal of right IJ Cordis sheath. Clear lungs. Clearing of bibasilar atelectasis. No congestive failure.  IMPRESSION: No acute cardiopulmonary disease.   Electronically Signed  By: Marylyn Ishihara  Jobe Igo M.D.  On: 07/14/2014 14:21  Impression:  Overall I think she is doing well. I encouraged her to continue walking. She is planning to participate in cardiac rehab but only one day per week since she can't afford to go more than that. I told her not to lift anything heavier than 10 lbs for three months postop. I think that her arm and hand pain is likely due to brachial plexus stretch but could also be due to some pressure on the nerves at the elbow during surgery. She is very symptomatic so I will start her on Neurontin 300 mg bid to see if that  will help this neurogenic pain. This can take many months to resolve.    Plan:  I will see her back in 1 month to see how she is doing with this.

## 2014-07-22 ENCOUNTER — Encounter (HOSPITAL_COMMUNITY): Payer: Self-pay | Admitting: Interventional Cardiology

## 2014-08-18 ENCOUNTER — Ambulatory Visit (INDEPENDENT_AMBULATORY_CARE_PROVIDER_SITE_OTHER): Payer: Self-pay | Admitting: Surgery

## 2014-08-18 ENCOUNTER — Encounter: Payer: Self-pay | Admitting: Surgery

## 2014-08-18 VITALS — BP 133/79 | HR 87 | Resp 20 | Ht 62.0 in | Wt 151.0 lb

## 2014-08-18 DIAGNOSIS — I2510 Atherosclerotic heart disease of native coronary artery without angina pectoris: Secondary | ICD-10-CM

## 2014-08-18 DIAGNOSIS — Z951 Presence of aortocoronary bypass graft: Secondary | ICD-10-CM

## 2014-08-18 DIAGNOSIS — M792 Neuralgia and neuritis, unspecified: Secondary | ICD-10-CM

## 2014-08-18 NOTE — Progress Notes (Signed)
     HPI:  The patient returns today for follow up having undergone CABG x 2 on 06/14/2014. When I last saw her on 07/15/2014 for a postop visit she was having severe pain down both arms into the 4th and 5th fingers consistent with brachial plexopathy. She was doing well otherwise. I started her on Neurontin 300 mg bid and she says the pain in her arms has resolved. She still has some numbness in the 4th and 5th fingers on both hands but it is improving. She was walking with no shortness of breath or chest pain until she sat down wrong and started having pain in the right knee. She had arthroscopic knee surgery last summer to remove a torn meniscus by Dr. Wynelle Link and is concerned that she may have injured it again.   Current Outpatient Prescriptions  Medication Sig Dispense Refill  . amLODipine (NORVASC) 5 MG tablet Take 5 mg by mouth every morning.     Marland Kitchen atorvastatin (LIPITOR) 10 MG tablet Take 10 mg by mouth daily.    . cetirizine (ZYRTEC) 10 MG tablet Take 10 mg by mouth daily.    . clopidogrel (PLAVIX) 75 MG tablet Take 75 mg by mouth daily.    . Coenzyme Q10 (CO Q 10) 100 MG CAPS Take 300 mg by mouth 2 (two) times daily.    . diphenhydramine-acetaminophen (TYLENOL PM) 25-500 MG TABS Take 1 tablet by mouth at bedtime as needed (for sleep or pain).     . Exenatide ER (BYDUREON) 2 MG SUSR Inject 2 mg into the skin once a week. Sundays    . gabapentin (NEURONTIN) 300 MG capsule Take 1 capsule (300 mg total) by mouth 2 (two) times daily. 60 capsule 1  . levothyroxine (SYNTHROID, LEVOTHROID) 75 MCG tablet Take 75 mcg by mouth every morning.    . metoprolol (LOPRESSOR) 50 MG tablet Take 1 tablet (50 mg total) by mouth 2 (two) times daily. 60 tablet 11  . Multiple Vitamins-Minerals (MULTIVITAMIN PO) Take 1 tablet by mouth daily.    . pantoprazole (PROTONIX) 40 MG tablet Take 1 tablet (40 mg total) by mouth daily before breakfast. 30 tablet 11  . sitaGLIPtin-metformin (JANUMET) 50-1000 MG per tablet  Take 1 tablet by mouth 2 (two) times daily. Hold for 48 hours, restart on 06/05/2014     No current facility-administered medications for this visit.     Physical Exam: BP 133/79 mmHg  Pulse 87  Resp 20  Ht 5\' 2"  (1.575 m)  Wt 151 lb (68.493 kg)  BMI 27.61 kg/m2  SpO2 99% She looks well Lungs are clear Cardiac exam shows a regular rate and rhythm with normal heart sounds The incisions are healing well There is swelling in the right knee.   Impression:  She is doing well from a cardiac standpoint and is ready for cardiac rehab when her knee is better. It has been a week and a half since she hurt it. She is going to give it a little more time and if not improved she is going to see Dr. Wynelle Link. I think she could have an injection in the knee if needed or physical therapy. Fortunately her brachial plexopathy is much improved and should resolve completely.   Plan:  She will decrease the Neurontin to 300 mg once daily and stop in a couple weeks if her finger numbness continues to improve. She will follow up with Dr. Tamala Julian for her cardiology care.

## 2014-08-19 ENCOUNTER — Encounter: Payer: Self-pay | Admitting: Interventional Cardiology

## 2014-08-19 ENCOUNTER — Encounter (HOSPITAL_COMMUNITY)
Admission: RE | Admit: 2014-08-19 | Discharge: 2014-08-19 | Disposition: A | Discharge status: Home / Self Care | Payer: Medicare Other | Source: Ambulatory Visit | Attending: Interventional Cardiology | Admitting: Interventional Cardiology

## 2014-08-19 DIAGNOSIS — Z886 Allergy status to analgesic agent status: Secondary | ICD-10-CM | POA: Insufficient documentation

## 2014-08-19 DIAGNOSIS — Z951 Presence of aortocoronary bypass graft: Secondary | ICD-10-CM | POA: Insufficient documentation

## 2014-08-19 DIAGNOSIS — I10 Essential (primary) hypertension: Secondary | ICD-10-CM | POA: Insufficient documentation

## 2014-08-19 DIAGNOSIS — Z91013 Allergy to seafood: Secondary | ICD-10-CM | POA: Insufficient documentation

## 2014-08-19 DIAGNOSIS — Z5189 Encounter for other specified aftercare: Secondary | ICD-10-CM | POA: Insufficient documentation

## 2014-08-19 DIAGNOSIS — G4730 Sleep apnea, unspecified: Secondary | ICD-10-CM | POA: Insufficient documentation

## 2014-08-19 DIAGNOSIS — E119 Type 2 diabetes mellitus without complications: Secondary | ICD-10-CM | POA: Insufficient documentation

## 2014-08-19 DIAGNOSIS — R000 Tachycardia, unspecified: Secondary | ICD-10-CM | POA: Insufficient documentation

## 2014-08-19 DIAGNOSIS — Z79899 Other long term (current) drug therapy: Secondary | ICD-10-CM | POA: Insufficient documentation

## 2014-08-19 DIAGNOSIS — Z888 Allergy status to other drugs, medicaments and biological substances status: Secondary | ICD-10-CM | POA: Insufficient documentation

## 2014-08-19 DIAGNOSIS — Z882 Allergy status to sulfonamides status: Secondary | ICD-10-CM | POA: Insufficient documentation

## 2014-08-19 DIAGNOSIS — E039 Hypothyroidism, unspecified: Secondary | ICD-10-CM | POA: Insufficient documentation

## 2014-08-19 DIAGNOSIS — K219 Gastro-esophageal reflux disease without esophagitis: Secondary | ICD-10-CM | POA: Insufficient documentation

## 2014-08-19 DIAGNOSIS — I2510 Atherosclerotic heart disease of native coronary artery without angina pectoris: Secondary | ICD-10-CM | POA: Insufficient documentation

## 2014-08-19 DIAGNOSIS — Z955 Presence of coronary angioplasty implant and graft: Secondary | ICD-10-CM | POA: Insufficient documentation

## 2014-08-19 DIAGNOSIS — Z7902 Long term (current) use of antithrombotics/antiplatelets: Secondary | ICD-10-CM | POA: Insufficient documentation

## 2014-08-19 DIAGNOSIS — Z8249 Family history of ischemic heart disease and other diseases of the circulatory system: Secondary | ICD-10-CM | POA: Insufficient documentation

## 2014-08-19 NOTE — Progress Notes (Signed)
Cardiac Rehab Medication Review by a Pharmacist  Does the patient  feel that his/her medications are working for him/her?  yes  Has the patient been experiencing any side effects to the medications prescribed?  no  Does the patient measure his/her own blood pressure or blood glucose at home?  Yes. CBGs 150s, sBPs highest 150s/90s  Does the patient have any problems obtaining medications due to transportation or finances?   no  Understanding of regimen: excellent Understanding of indications: excellent Potential of compliance: excellent   Pharmacist comments: Pt is a pleasant 69 yo female who presents to cardiac rehab today for review of her medications. She states that once she left the hospital, she was told to stop taking her losartan, and she is continuing to take clonidine. She takes CoQ10 because she had some muscle aches 10 years ago when she started taking her statin. However, pt would likely benefit from a high-intensity statin. Pt has no other questions today.   Megan E. Supple, Pharm.D Clinical Pharmacy Resident Pager: [phone] 08/19/2014 9:26 AM

## 2014-08-20 ENCOUNTER — Telehealth (HOSPITAL_COMMUNITY): Payer: Self-pay | Admitting: *Deleted

## 2014-08-20 NOTE — Telephone Encounter (Signed)
-----  Message from Sinclair Grooms, MD sent at 08/19/2014  6:47 PM EST ----- Regarding: RE: Clarification of blood pressure medication I asked her to resume losartan ----- Message -----    From: Rowe Pavy, RN    Sent: 08/19/2014  11:37 AM      To: Sinclair Grooms, MD, # Subject: Clarification of blood pressure medication       Pt in today for orientation to cardiac rehab.  Pt met with the pharmacist to review medications.  Pt has some confusion regarding her blood pressure medications.  Pt continues to take clonidine and stopped the losartan.  Concerned because bp running 150/90 at home. Today's reading 128/74 (at rest no exercise today for orientation).  What should pt take?  Thanks Carlette

## 2014-08-20 NOTE — Telephone Encounter (Signed)
Advised pt that rehab staff heard back from Dr. Tamala Julian regarding blood pressure medications.  Pt advised that an email had been sent by dr. Tamala Julian to resume her losartan.  Pt verbalized understanding.  Cherre Huger, BSN

## 2014-08-23 ENCOUNTER — Encounter (HOSPITAL_COMMUNITY): Payer: Medicare Other

## 2014-08-23 ENCOUNTER — Encounter (HOSPITAL_COMMUNITY)
Admission: RE | Admit: 2014-08-23 | Discharge: 2014-08-23 | Disposition: A | Discharge status: Home / Self Care | Payer: Medicare Other | Source: Ambulatory Visit | Attending: Interventional Cardiology | Admitting: Interventional Cardiology

## 2014-08-23 DIAGNOSIS — Z5189 Encounter for other specified aftercare: Secondary | ICD-10-CM | POA: Diagnosis present

## 2014-08-23 DIAGNOSIS — Z8249 Family history of ischemic heart disease and other diseases of the circulatory system: Secondary | ICD-10-CM | POA: Diagnosis not present

## 2014-08-23 DIAGNOSIS — E119 Type 2 diabetes mellitus without complications: Secondary | ICD-10-CM | POA: Diagnosis not present

## 2014-08-23 DIAGNOSIS — K219 Gastro-esophageal reflux disease without esophagitis: Secondary | ICD-10-CM | POA: Diagnosis not present

## 2014-08-23 DIAGNOSIS — G4730 Sleep apnea, unspecified: Secondary | ICD-10-CM | POA: Diagnosis not present

## 2014-08-23 DIAGNOSIS — Z951 Presence of aortocoronary bypass graft: Secondary | ICD-10-CM | POA: Diagnosis not present

## 2014-08-23 DIAGNOSIS — Z7902 Long term (current) use of antithrombotics/antiplatelets: Secondary | ICD-10-CM | POA: Diagnosis not present

## 2014-08-23 DIAGNOSIS — I10 Essential (primary) hypertension: Secondary | ICD-10-CM | POA: Diagnosis not present

## 2014-08-23 DIAGNOSIS — Z882 Allergy status to sulfonamides status: Secondary | ICD-10-CM | POA: Diagnosis not present

## 2014-08-23 DIAGNOSIS — Z79899 Other long term (current) drug therapy: Secondary | ICD-10-CM | POA: Diagnosis not present

## 2014-08-23 DIAGNOSIS — Z91013 Allergy to seafood: Secondary | ICD-10-CM | POA: Diagnosis not present

## 2014-08-23 DIAGNOSIS — E039 Hypothyroidism, unspecified: Secondary | ICD-10-CM | POA: Diagnosis not present

## 2014-08-23 DIAGNOSIS — R000 Tachycardia, unspecified: Secondary | ICD-10-CM | POA: Diagnosis not present

## 2014-08-23 DIAGNOSIS — I2510 Atherosclerotic heart disease of native coronary artery without angina pectoris: Secondary | ICD-10-CM | POA: Diagnosis not present

## 2014-08-23 DIAGNOSIS — Z955 Presence of coronary angioplasty implant and graft: Secondary | ICD-10-CM | POA: Diagnosis not present

## 2014-08-23 DIAGNOSIS — Z888 Allergy status to other drugs, medicaments and biological substances status: Secondary | ICD-10-CM | POA: Diagnosis not present

## 2014-08-23 DIAGNOSIS — Z886 Allergy status to analgesic agent status: Secondary | ICD-10-CM | POA: Diagnosis not present

## 2014-08-23 LAB — GLUCOSE, CAPILLARY: Glucose-Capillary: 345 mg/dL — ABNORMAL HIGH (ref 70–99)

## 2014-08-23 NOTE — Progress Notes (Signed)
Pt in today for her first day of exercise at the 6:45 am cardiac rehab phase II session.   Pt pre exercise blood glucose checked - 345.  Pt remarked that it was higher than usual this morning at home mid 200 range.  Pt had special k cereal with strawberries.  Pt given h20 to drink.  Ketones checked for ketones - negative.  Pt missed taking her Bydureon on yesterday.  Pt plans to take this when she gets home.  Advised pt to monitor he blood glucose closely today.  Will retry again on again on Wednesday.  Cherre Huger, BSN

## 2014-08-25 ENCOUNTER — Encounter (HOSPITAL_COMMUNITY): Payer: Medicare Other

## 2014-08-25 ENCOUNTER — Encounter (HOSPITAL_COMMUNITY)
Admission: RE | Admit: 2014-08-25 | Discharge: 2014-08-25 | Disposition: A | Discharge status: Home / Self Care | Payer: Medicare Other | Source: Ambulatory Visit | Attending: Interventional Cardiology | Admitting: Interventional Cardiology

## 2014-08-25 DIAGNOSIS — Z5189 Encounter for other specified aftercare: Secondary | ICD-10-CM | POA: Diagnosis not present

## 2014-08-25 LAB — GLUCOSE, CAPILLARY
Glucose-Capillary: 139 mg/dL — ABNORMAL HIGH (ref 70–99)
Glucose-Capillary: 187 mg/dL — ABNORMAL HIGH (ref 70–99)

## 2014-08-27 ENCOUNTER — Encounter (HOSPITAL_COMMUNITY)
Admission: RE | Admit: 2014-08-27 | Discharge: 2014-08-27 | Disposition: A | Discharge status: Home / Self Care | Payer: Medicare Other | Source: Ambulatory Visit | Attending: Interventional Cardiology | Admitting: Interventional Cardiology

## 2014-08-27 ENCOUNTER — Encounter (HOSPITAL_COMMUNITY): Payer: Medicare Other

## 2014-08-27 DIAGNOSIS — Z5189 Encounter for other specified aftercare: Secondary | ICD-10-CM | POA: Diagnosis not present

## 2014-08-27 LAB — GLUCOSE, CAPILLARY
Glucose-Capillary: 193 mg/dL — ABNORMAL HIGH (ref 70–99)
Glucose-Capillary: 164 mg/dL — ABNORMAL HIGH (ref 70–99)

## 2014-09-01 ENCOUNTER — Encounter (HOSPITAL_COMMUNITY): Payer: Medicare Other

## 2014-09-01 ENCOUNTER — Encounter (HOSPITAL_COMMUNITY)
Admission: RE | Admit: 2014-09-01 | Discharge: 2014-09-01 | Disposition: A | Discharge status: Home / Self Care | Payer: Medicare Other | Source: Ambulatory Visit | Attending: Interventional Cardiology | Admitting: Interventional Cardiology

## 2014-09-01 DIAGNOSIS — Z5189 Encounter for other specified aftercare: Secondary | ICD-10-CM | POA: Diagnosis not present

## 2014-09-02 ENCOUNTER — Other Ambulatory Visit: Payer: Self-pay | Admitting: Surgery

## 2014-09-03 ENCOUNTER — Encounter (HOSPITAL_COMMUNITY): Payer: Medicare Other

## 2014-09-06 ENCOUNTER — Encounter (HOSPITAL_COMMUNITY): Payer: Medicare Other

## 2014-09-08 ENCOUNTER — Encounter (HOSPITAL_COMMUNITY)
Admission: RE | Admit: 2014-09-08 | Discharge: 2014-09-08 | Disposition: A | Discharge status: Home / Self Care | Payer: Medicare Other | Source: Ambulatory Visit | Attending: Interventional Cardiology | Admitting: Interventional Cardiology

## 2014-09-08 ENCOUNTER — Encounter: Payer: Self-pay | Admitting: Interventional Cardiology

## 2014-09-08 ENCOUNTER — Ambulatory Visit (INDEPENDENT_AMBULATORY_CARE_PROVIDER_SITE_OTHER): Payer: Medicare Other | Admitting: Interventional Cardiology

## 2014-09-08 ENCOUNTER — Encounter (HOSPITAL_COMMUNITY): Payer: Medicare Other

## 2014-09-08 VITALS — BP 130/70 | HR 85 | Ht 62.0 in | Wt 153.8 lb

## 2014-09-08 DIAGNOSIS — Z951 Presence of aortocoronary bypass graft: Secondary | ICD-10-CM

## 2014-09-08 DIAGNOSIS — E118 Type 2 diabetes mellitus with unspecified complications: Secondary | ICD-10-CM

## 2014-09-08 DIAGNOSIS — I25709 Atherosclerosis of coronary artery bypass graft(s), unspecified, with unspecified angina pectoris: Secondary | ICD-10-CM

## 2014-09-08 DIAGNOSIS — I10 Essential (primary) hypertension: Secondary | ICD-10-CM

## 2014-09-08 DIAGNOSIS — Z5189 Encounter for other specified aftercare: Secondary | ICD-10-CM | POA: Diagnosis not present

## 2014-09-08 MED ORDER — METOPROLOL TARTRATE 50 MG PO TABS: 50.0000 mg | ORAL_TABLET | Freq: Two times a day (BID) | ORAL | Status: DC

## 2014-09-08 MED ORDER — PANTOPRAZOLE SODIUM 40 MG PO TBEC: 40.0000 mg | DELAYED_RELEASE_TABLET | Freq: Every day | ORAL | Status: DC

## 2014-09-08 MED ORDER — LOSARTAN POTASSIUM-HCTZ 100-12.5 MG PO TABS: 1.0000 | ORAL_TABLET | Freq: Every day | ORAL | Status: DC

## 2014-09-08 NOTE — Progress Notes (Signed)
Patient ID: Susan Barnett, female   DOB: [birth year], 69 y.o.   MRN: [id number]    Cardiology Office Note   Date:  09/08/2014   ID:  Susan, Barnett January 25, 1946, MRN [id number]  PCP:  Susan Frees, MD  Cardiologist:   Susan Grooms, MD   CABG and Hypertension follow-up    History of Present Illness: Susan Barnett is a 69 y.o. female who presents for follow-up after bypass surgery. She had 2 vessel bypass with SVG to diagonal and SVG to PDA. She is allergic to aspirin. She had bilateral brachioplexus injury related to CABG. She has been released by Dr. Cyndia Barnett. She has now in cardiac rehabilitation.    Past Medical History  Diagnosis Date  . Hypertension   . Gastric polyp   . Rectal bleeding   . Hypothyroidism   . Heartburn   . Occasional tremors     "of head", slight hands  . Hiatal hernia     mild head tremors  . Anemia     mild  . CAD (coronary artery disease)     Dr. Linard Millers follows, no problems now  . GERD (gastroesophageal reflux disease)   . Diabetes mellitus, type 2     TYPE 2  . Anginal pain     with exertion, last time prior to cardiac caht 06/02/14  . Dysrhythmia     occ skips a beat, rate was fast before beta blocker.  . Sleep apnea     cpap used nightly x 2 yrs now.  . Pneumonia   . Shortness of breath     with exertion  . Anxiety     situational  . Headache     hx of  . Arthritis   . Complication of anesthesia   . PONV (postoperative nausea and vomiting) 02/24/14    Past Surgical History  Procedure Laterality Date  . Coronary stents      x3 stents placed-prior ablation  . Elbow fracture surgery Left   . Incontinence surgery      sling  . Knee arthroscopy Right 02/24/2014    Procedure: RIGHT KNEE ARTHROSCOPY WITH DEBRIDEMENT ;  Surgeon: Susan Alf, MD;  Location: [facility];  Service: Orthopedics;  Laterality: Right;  . Cardiac catheterization  06/02/2014    LAD 30%, D1 80%, CFX 755, RCA > 95% ISR, rx w/ PTCA, heavy calcification,  EF 65%  . Dilation and curettage of uterus    . Coronary artery bypass graft N/A 06/14/2014    Procedure: CORONARY ARTERY BYPASS GRAFTING (CABG);  Surgeon: Susan Pollack, MD;  Location: [facility];  Service: Open Heart Surgery;  Laterality: N/A;  Times 2 using endoscopically harvested right saphenous vein.  Marland Kitchen Tee without cardioversion N/A 06/14/2014    Procedure: TRANSESOPHAGEAL ECHOCARDIOGRAM (TEE);  Surgeon: Susan Pollack, MD;  Location: [facility];  Service: Open Heart Surgery;  Laterality: N/A;  . Left heart catheterization with coronary angiogram N/A 06/02/2014    Procedure: LEFT HEART CATHETERIZATION WITH CORONARY ANGIOGRAM;  Surgeon: Susan Grooms, MD;  Location: [facility] CATH LAB;  Service: Cardiovascular;  Laterality: N/A;     Current Outpatient Prescriptions  Medication Sig Dispense Refill  . amLODipine (NORVASC) 5 MG tablet Take 5 mg by mouth every morning.     Marland Kitchen atorvastatin (LIPITOR) 10 MG tablet Take 10 mg by mouth daily.    . cetirizine (ZYRTEC) 10 MG tablet Take 10 mg by mouth daily.    . cloNIDine (  CATAPRES) 0.1 MG tablet Take 0.1 mg by mouth daily.    . clopidogrel (PLAVIX) 75 MG tablet Take 75 mg by mouth daily.    . Coenzyme Q10 (CO Q 10) 100 MG CAPS Take 300 mg by mouth 2 (two) times daily.    . diphenhydramine-acetaminophen (TYLENOL PM) 25-500 MG TABS Take 1 tablet by mouth at bedtime as needed (for sleep or pain).     . Exenatide ER (BYDUREON) 2 MG SUSR Inject 2 mg into the skin once a week. Sundays    . gabapentin (NEURONTIN) 300 MG capsule TAKE 1 CAPSULE (300 MG TOTAL) BY MOUTH 2 (TWO) TIMES DAILY. 60 capsule 1  . levothyroxine (SYNTHROID, LEVOTHROID) 75 MCG tablet Take 75 mcg by mouth every morning.    Marland Kitchen losartan-hydrochlorothiazide (HYZAAR) 100-25 MG per tablet Take 1 tablet by mouth daily.    . metoprolol (LOPRESSOR) 50 MG tablet Take 1 tablet (50 mg total) by mouth 2 (two) times daily. 60 tablet 11  . Multiple Vitamins-Minerals (MULTIVITAMIN PO) Take 1 tablet by mouth daily.     . pantoprazole (PROTONIX) 40 MG tablet Take 1 tablet (40 mg total) by mouth daily before breakfast. 30 tablet 11  . sitaGLIPtin-metformin (JANUMET) 50-1000 MG per tablet Take 1 tablet by mouth 2 (two) times daily. Hold for 48 hours, restart on 06/05/2014     No current facility-administered medications for this visit.    Allergies:   Asa; Compazine; Nsaids; Shellfish allergy; Sulfa antibiotics; Zantac; and Lisinopril    Social History:  The patient  reports that she has never smoked. She has never used smokeless tobacco. She reports that she does not drink alcohol or use illicit drugs.   Family History:  The patient's family history includes Heart attack in her mother.    ROS:  Please see the history of present illness.   Otherwise, review of systems are positive for numbness and tingling in her fingers. Also pain in the incision at site of bypass surgery and right lower extremity vein graft harvesting..   All other systems are reviewed and negative.    PHYSICAL EXAM: VS:  BP 130/70 mmHg  Pulse 85  Ht 5\' 2"  (1.575 m)  Wt 153 lb 12.8 oz (69.763 kg)  BMI 28.12 kg/m2  SpO2 99% , BMI Body mass index is 28.12 kg/(m^2). GEN: Well nourished, well developed, in no acute distress HEENT: normal Neck: no JVD, carotid bruits, or masses Cardiac: RRR; no murmurs, rubs, or gallops,no edema  Respiratory:  clear to auscultation bilaterally, normal work of breathing GI: soft, nontender, nondistended, + BS MS: no deformity or atrophy Skin: warm and dry, no rash Neuro:  Strength and sensation are intact Psych: euthymic mood, full affect   EKG:  EKG is not ordered today.    Recent Labs: 06/11/2014: ALT 15 06/15/2014: Magnesium 2.7* 06/16/2014: Hemoglobin 9.6*; Platelets 209 06/17/2014: BUN 8; Creatinine 0.43*; Potassium 3.7; Sodium 133*    Lipid Panel    Component Value Date/Time   CHOL  08/13/2007 0615    116        ATP III CLASSIFICATION:  <200     mg/dL   Desirable  200-239   mg/dL   Borderline High  >=240    mg/dL   High   TRIG 69 08/13/2007 0615   HDL 41 08/13/2007 0615   CHOLHDL 2.8 08/13/2007 0615   VLDL 14 08/13/2007 0615   LDLCALC  08/13/2007 0615    61        Total  Cholesterol/HDL:CHD Risk Coronary Heart Disease Risk Table                     Men   Women  1/2 Average Risk   3.4   3.3      Wt Readings from Last 3 Encounters:  09/08/14 153 lb 12.8 oz (69.763 kg)  08/18/14 151 lb (68.493 kg)  07/14/14 152 lb (68.947 kg)      Other studies Reviewed: Additional studies/ records that were reviewed today include: none.. Review of the above records demonstrates: none.   ASSESSMENT AND PLAN:  1.  Coronary artery disease with recent coronary bypass grafting using saphenous vein graft to the diagonal into the right coronary. The right coronary required grafting because of repeated multiple restenosis of the right coronary that included brachytherapy and multiple subsequent stents. She is asymptomatic with reference to angina. 2. Essential hypertension with moderate control 3. Hyperlipidemia on therapy    Current medicines are reviewed at length with the patient today.  The patient does not have concerns regarding medicines.  The following changes have been made:  no change  Labs/ tests ordered today include: None  No orders of the defined types were placed in this encounter.     Disposition:   FU with Linard Millers in 4 months   Signed, Susan Grooms, MD  09/08/2014 3:32 PM    [facility] [place], [place], [place]  [address] Phone: [phone]; Fax: [phone]

## 2014-09-08 NOTE — Patient Instructions (Signed)
Your physician recommends that you continue on your current medications as directed. Please refer to the Current Medication list given to you today.  Your cardiac medications have been refilled today  Your physician wants you to follow-up in: 4 months with Dr.Smith You will receive a reminder letter in the mail two months in advance. If you don't receive a letter, please call our office to schedule the follow-up appointment.

## 2014-09-10 ENCOUNTER — Encounter (HOSPITAL_COMMUNITY)
Admission: RE | Admit: 2014-09-10 | Discharge: 2014-09-10 | Disposition: A | Discharge status: Home / Self Care | Payer: Medicare Other | Source: Ambulatory Visit | Attending: Interventional Cardiology | Admitting: Interventional Cardiology

## 2014-09-10 ENCOUNTER — Ambulatory Visit: Payer: Medicare Other | Admitting: Interventional Cardiology

## 2014-09-10 ENCOUNTER — Encounter (HOSPITAL_COMMUNITY): Payer: Medicare Other

## 2014-09-10 DIAGNOSIS — Z5189 Encounter for other specified aftercare: Secondary | ICD-10-CM | POA: Diagnosis not present

## 2014-09-13 ENCOUNTER — Encounter (HOSPITAL_COMMUNITY): Payer: Medicare Other

## 2014-09-15 ENCOUNTER — Encounter (HOSPITAL_COMMUNITY)
Admission: RE | Admit: 2014-09-15 | Discharge: 2014-09-15 | Disposition: A | Discharge status: Home / Self Care | Payer: Medicare Other | Source: Ambulatory Visit | Attending: Interventional Cardiology | Admitting: Interventional Cardiology

## 2014-09-15 ENCOUNTER — Encounter (HOSPITAL_COMMUNITY): Payer: Medicare Other

## 2014-09-15 DIAGNOSIS — Z79899 Other long term (current) drug therapy: Secondary | ICD-10-CM | POA: Diagnosis not present

## 2014-09-15 DIAGNOSIS — Z5189 Encounter for other specified aftercare: Secondary | ICD-10-CM | POA: Diagnosis not present

## 2014-09-15 DIAGNOSIS — Z886 Allergy status to analgesic agent status: Secondary | ICD-10-CM | POA: Diagnosis not present

## 2014-09-15 DIAGNOSIS — R000 Tachycardia, unspecified: Secondary | ICD-10-CM | POA: Diagnosis not present

## 2014-09-15 DIAGNOSIS — K219 Gastro-esophageal reflux disease without esophagitis: Secondary | ICD-10-CM | POA: Diagnosis not present

## 2014-09-15 DIAGNOSIS — Z955 Presence of coronary angioplasty implant and graft: Secondary | ICD-10-CM | POA: Insufficient documentation

## 2014-09-15 DIAGNOSIS — I10 Essential (primary) hypertension: Secondary | ICD-10-CM | POA: Diagnosis not present

## 2014-09-15 DIAGNOSIS — I2510 Atherosclerotic heart disease of native coronary artery without angina pectoris: Secondary | ICD-10-CM | POA: Diagnosis not present

## 2014-09-15 DIAGNOSIS — E039 Hypothyroidism, unspecified: Secondary | ICD-10-CM | POA: Insufficient documentation

## 2014-09-15 DIAGNOSIS — Z7902 Long term (current) use of antithrombotics/antiplatelets: Secondary | ICD-10-CM | POA: Insufficient documentation

## 2014-09-15 DIAGNOSIS — Z91013 Allergy to seafood: Secondary | ICD-10-CM | POA: Diagnosis not present

## 2014-09-15 DIAGNOSIS — Z951 Presence of aortocoronary bypass graft: Secondary | ICD-10-CM | POA: Diagnosis not present

## 2014-09-15 DIAGNOSIS — Z8249 Family history of ischemic heart disease and other diseases of the circulatory system: Secondary | ICD-10-CM | POA: Insufficient documentation

## 2014-09-15 DIAGNOSIS — Z888 Allergy status to other drugs, medicaments and biological substances status: Secondary | ICD-10-CM | POA: Diagnosis not present

## 2014-09-15 DIAGNOSIS — G4730 Sleep apnea, unspecified: Secondary | ICD-10-CM | POA: Diagnosis not present

## 2014-09-15 DIAGNOSIS — Z882 Allergy status to sulfonamides status: Secondary | ICD-10-CM | POA: Insufficient documentation

## 2014-09-15 DIAGNOSIS — E119 Type 2 diabetes mellitus without complications: Secondary | ICD-10-CM | POA: Diagnosis not present

## 2014-09-17 ENCOUNTER — Encounter (HOSPITAL_COMMUNITY): Payer: Medicare Other

## 2014-09-17 ENCOUNTER — Encounter (HOSPITAL_COMMUNITY)
Admission: RE | Admit: 2014-09-17 | Discharge: 2014-09-17 | Disposition: A | Discharge status: Home / Self Care | Payer: Medicare Other | Source: Ambulatory Visit | Attending: Interventional Cardiology | Admitting: Interventional Cardiology

## 2014-09-17 DIAGNOSIS — Z5189 Encounter for other specified aftercare: Secondary | ICD-10-CM | POA: Diagnosis not present

## 2014-09-20 ENCOUNTER — Encounter (HOSPITAL_COMMUNITY): Payer: Medicare Other

## 2014-09-20 ENCOUNTER — Encounter (HOSPITAL_COMMUNITY)
Admission: RE | Admit: 2014-09-20 | Discharge: 2014-09-20 | Disposition: A | Discharge status: Home / Self Care | Payer: Medicare Other | Source: Ambulatory Visit | Attending: Interventional Cardiology | Admitting: Interventional Cardiology

## 2014-09-20 DIAGNOSIS — Z5189 Encounter for other specified aftercare: Secondary | ICD-10-CM | POA: Diagnosis not present

## 2014-09-22 ENCOUNTER — Encounter (HOSPITAL_COMMUNITY): Payer: Medicare Other

## 2014-09-22 ENCOUNTER — Encounter (HOSPITAL_COMMUNITY)
Admission: RE | Admit: 2014-09-22 | Discharge: 2014-09-22 | Disposition: A | Discharge status: Home / Self Care | Payer: Medicare Other | Source: Ambulatory Visit | Attending: Interventional Cardiology | Admitting: Interventional Cardiology

## 2014-09-22 DIAGNOSIS — Z5189 Encounter for other specified aftercare: Secondary | ICD-10-CM | POA: Diagnosis not present

## 2014-09-24 ENCOUNTER — Encounter (HOSPITAL_COMMUNITY)
Admission: RE | Admit: 2014-09-24 | Discharge: 2014-09-24 | Disposition: A | Discharge status: Home / Self Care | Payer: Medicare Other | Source: Ambulatory Visit | Attending: Interventional Cardiology | Admitting: Interventional Cardiology

## 2014-09-24 ENCOUNTER — Encounter (HOSPITAL_COMMUNITY): Payer: Medicare Other

## 2014-09-24 DIAGNOSIS — Z5189 Encounter for other specified aftercare: Secondary | ICD-10-CM | POA: Diagnosis not present

## 2014-09-27 ENCOUNTER — Encounter (HOSPITAL_COMMUNITY): Payer: Medicare Other

## 2014-09-27 LAB — GLUCOSE, CAPILLARY: Glucose-Capillary: 149 mg/dL — ABNORMAL HIGH (ref 70–99)

## 2014-09-29 ENCOUNTER — Encounter (HOSPITAL_COMMUNITY): Payer: Medicare Other

## 2014-09-29 ENCOUNTER — Encounter (HOSPITAL_COMMUNITY)
Admission: RE | Admit: 2014-09-29 | Discharge: 2014-09-29 | Disposition: A | Discharge status: Home / Self Care | Payer: Medicare Other | Source: Ambulatory Visit | Attending: Interventional Cardiology | Admitting: Interventional Cardiology

## 2014-09-29 DIAGNOSIS — Z5189 Encounter for other specified aftercare: Secondary | ICD-10-CM | POA: Diagnosis not present

## 2014-10-01 ENCOUNTER — Encounter (HOSPITAL_COMMUNITY): Payer: Medicare Other

## 2014-10-01 ENCOUNTER — Encounter (HOSPITAL_COMMUNITY)
Admission: RE | Admit: 2014-10-01 | Discharge: 2014-10-01 | Disposition: A | Discharge status: Home / Self Care | Payer: Medicare Other | Source: Ambulatory Visit | Attending: Interventional Cardiology | Admitting: Interventional Cardiology

## 2014-10-01 DIAGNOSIS — Z5189 Encounter for other specified aftercare: Secondary | ICD-10-CM | POA: Diagnosis not present

## 2014-10-04 ENCOUNTER — Encounter (HOSPITAL_COMMUNITY): Payer: Medicare Other

## 2014-10-06 ENCOUNTER — Encounter (HOSPITAL_COMMUNITY): Payer: Medicare Other

## 2014-10-06 ENCOUNTER — Encounter (HOSPITAL_COMMUNITY)
Admission: RE | Admit: 2014-10-06 | Discharge: 2014-10-06 | Disposition: A | Discharge status: Home / Self Care | Payer: Medicare Other | Source: Ambulatory Visit | Attending: Interventional Cardiology | Admitting: Interventional Cardiology

## 2014-10-06 DIAGNOSIS — Z5189 Encounter for other specified aftercare: Secondary | ICD-10-CM | POA: Diagnosis not present

## 2014-10-08 ENCOUNTER — Encounter (HOSPITAL_COMMUNITY): Payer: Medicare Other

## 2014-10-08 ENCOUNTER — Encounter (HOSPITAL_COMMUNITY)
Admission: RE | Admit: 2014-10-08 | Discharge: 2014-10-08 | Disposition: A | Discharge status: Home / Self Care | Payer: Medicare Other | Source: Ambulatory Visit | Attending: Interventional Cardiology | Admitting: Interventional Cardiology

## 2014-10-08 DIAGNOSIS — Z5189 Encounter for other specified aftercare: Secondary | ICD-10-CM | POA: Diagnosis not present

## 2014-10-11 ENCOUNTER — Encounter (HOSPITAL_COMMUNITY): Payer: Medicare Other

## 2014-10-11 ENCOUNTER — Encounter (HOSPITAL_COMMUNITY)
Admission: RE | Admit: 2014-10-11 | Discharge: 2014-10-11 | Disposition: A | Discharge status: Home / Self Care | Payer: Medicare Other | Source: Ambulatory Visit | Attending: Interventional Cardiology | Admitting: Interventional Cardiology

## 2014-10-11 DIAGNOSIS — Z5189 Encounter for other specified aftercare: Secondary | ICD-10-CM | POA: Diagnosis not present

## 2014-10-13 ENCOUNTER — Encounter (HOSPITAL_COMMUNITY): Payer: Medicare Other

## 2014-10-13 ENCOUNTER — Encounter (HOSPITAL_COMMUNITY)
Admission: RE | Admit: 2014-10-13 | Discharge: 2014-10-13 | Disposition: A | Discharge status: Home / Self Care | Payer: Medicare Other | Source: Ambulatory Visit | Attending: Interventional Cardiology | Admitting: Interventional Cardiology

## 2014-10-13 DIAGNOSIS — R000 Tachycardia, unspecified: Secondary | ICD-10-CM | POA: Diagnosis not present

## 2014-10-13 DIAGNOSIS — Z888 Allergy status to other drugs, medicaments and biological substances status: Secondary | ICD-10-CM | POA: Insufficient documentation

## 2014-10-13 DIAGNOSIS — Z79899 Other long term (current) drug therapy: Secondary | ICD-10-CM | POA: Diagnosis not present

## 2014-10-13 DIAGNOSIS — E119 Type 2 diabetes mellitus without complications: Secondary | ICD-10-CM | POA: Insufficient documentation

## 2014-10-13 DIAGNOSIS — Z951 Presence of aortocoronary bypass graft: Secondary | ICD-10-CM | POA: Insufficient documentation

## 2014-10-13 DIAGNOSIS — I10 Essential (primary) hypertension: Secondary | ICD-10-CM | POA: Diagnosis not present

## 2014-10-13 DIAGNOSIS — Z7902 Long term (current) use of antithrombotics/antiplatelets: Secondary | ICD-10-CM | POA: Insufficient documentation

## 2014-10-13 DIAGNOSIS — E039 Hypothyroidism, unspecified: Secondary | ICD-10-CM | POA: Insufficient documentation

## 2014-10-13 DIAGNOSIS — Z91013 Allergy to seafood: Secondary | ICD-10-CM | POA: Diagnosis not present

## 2014-10-13 DIAGNOSIS — K219 Gastro-esophageal reflux disease without esophagitis: Secondary | ICD-10-CM | POA: Insufficient documentation

## 2014-10-13 DIAGNOSIS — Z5189 Encounter for other specified aftercare: Secondary | ICD-10-CM | POA: Diagnosis present

## 2014-10-13 DIAGNOSIS — Z882 Allergy status to sulfonamides status: Secondary | ICD-10-CM | POA: Insufficient documentation

## 2014-10-13 DIAGNOSIS — Z886 Allergy status to analgesic agent status: Secondary | ICD-10-CM | POA: Insufficient documentation

## 2014-10-13 DIAGNOSIS — Z8249 Family history of ischemic heart disease and other diseases of the circulatory system: Secondary | ICD-10-CM | POA: Insufficient documentation

## 2014-10-13 DIAGNOSIS — G4730 Sleep apnea, unspecified: Secondary | ICD-10-CM | POA: Diagnosis not present

## 2014-10-13 DIAGNOSIS — I2510 Atherosclerotic heart disease of native coronary artery without angina pectoris: Secondary | ICD-10-CM | POA: Insufficient documentation

## 2014-10-13 DIAGNOSIS — Z955 Presence of coronary angioplasty implant and graft: Secondary | ICD-10-CM | POA: Diagnosis not present

## 2014-10-15 ENCOUNTER — Encounter (HOSPITAL_COMMUNITY): Payer: Medicare Other

## 2014-10-18 ENCOUNTER — Encounter (HOSPITAL_COMMUNITY): Payer: Medicare Other

## 2014-10-20 ENCOUNTER — Encounter (HOSPITAL_COMMUNITY): Payer: Medicare Other

## 2014-10-20 ENCOUNTER — Encounter (HOSPITAL_COMMUNITY)
Admission: RE | Admit: 2014-10-20 | Discharge: 2014-10-20 | Disposition: A | Discharge status: Home / Self Care | Payer: Medicare Other | Source: Ambulatory Visit | Attending: Interventional Cardiology | Admitting: Interventional Cardiology

## 2014-10-20 DIAGNOSIS — Z5189 Encounter for other specified aftercare: Secondary | ICD-10-CM | POA: Diagnosis not present

## 2014-10-22 ENCOUNTER — Encounter (HOSPITAL_COMMUNITY)
Admission: RE | Admit: 2014-10-22 | Discharge: 2014-10-22 | Disposition: A | Discharge status: Home / Self Care | Payer: Medicare Other | Source: Ambulatory Visit | Attending: Interventional Cardiology | Admitting: Interventional Cardiology

## 2014-10-22 ENCOUNTER — Encounter (HOSPITAL_COMMUNITY): Payer: Medicare Other

## 2014-10-22 DIAGNOSIS — Z5189 Encounter for other specified aftercare: Secondary | ICD-10-CM | POA: Diagnosis not present

## 2014-10-25 ENCOUNTER — Encounter (HOSPITAL_COMMUNITY)
Admission: RE | Admit: 2014-10-25 | Discharge: 2014-10-25 | Disposition: A | Discharge status: Home / Self Care | Payer: Medicare Other | Source: Ambulatory Visit | Attending: Interventional Cardiology | Admitting: Interventional Cardiology

## 2014-10-25 ENCOUNTER — Encounter (HOSPITAL_COMMUNITY): Payer: Medicare Other

## 2014-10-25 DIAGNOSIS — Z5189 Encounter for other specified aftercare: Secondary | ICD-10-CM | POA: Diagnosis not present

## 2014-10-27 ENCOUNTER — Encounter (HOSPITAL_COMMUNITY)
Admission: RE | Admit: 2014-10-27 | Discharge: 2014-10-27 | Disposition: A | Discharge status: Home / Self Care | Payer: Medicare Other | Source: Ambulatory Visit | Attending: Interventional Cardiology | Admitting: Interventional Cardiology

## 2014-10-27 ENCOUNTER — Encounter (HOSPITAL_COMMUNITY): Payer: Medicare Other

## 2014-10-27 DIAGNOSIS — Z5189 Encounter for other specified aftercare: Secondary | ICD-10-CM | POA: Diagnosis not present

## 2014-10-29 ENCOUNTER — Encounter (HOSPITAL_COMMUNITY)
Admission: RE | Admit: 2014-10-29 | Discharge: 2014-10-29 | Disposition: A | Discharge status: Home / Self Care | Payer: Medicare Other | Source: Ambulatory Visit | Attending: Interventional Cardiology | Admitting: Interventional Cardiology

## 2014-10-29 ENCOUNTER — Telehealth: Payer: Self-pay | Admitting: Interventional Cardiology

## 2014-10-29 ENCOUNTER — Encounter (HOSPITAL_COMMUNITY): Payer: Medicare Other

## 2014-10-29 DIAGNOSIS — Z5189 Encounter for other specified aftercare: Secondary | ICD-10-CM | POA: Diagnosis not present

## 2014-10-29 NOTE — Telephone Encounter (Signed)
New Message       Pt calling stating that she had bypass surgery November of 2015 and knee surgery in July of 2015. Pt states her orthopedic doctor gave her a knee brace and she recently re-injured her knee and now when she puts pressure on the foot her foot goes completely numb and she wants to know if this is something she should be concerned about. Pt already contacted her orthopedic doctor and they are not in the office today and pt wants to find out from Dr. Tamala Julian if it could be a circulation issue that she needs to be concerned about. Please call back and advise.

## 2014-10-29 NOTE — Telephone Encounter (Signed)
Called patient back. Informed patient that this sounds like an orthopedic issue. Referred patient to urgent care or PCP since unable to see orthopedic doctor, and this is an issue that needs to be evaluated sooner than later. Patient agreed with plan.

## 2014-11-01 ENCOUNTER — Encounter (HOSPITAL_COMMUNITY)
Admission: RE | Admit: 2014-11-01 | Discharge: 2014-11-01 | Disposition: A | Discharge status: Home / Self Care | Payer: Medicare Other | Source: Ambulatory Visit | Attending: Interventional Cardiology | Admitting: Interventional Cardiology

## 2014-11-01 ENCOUNTER — Encounter (HOSPITAL_COMMUNITY): Payer: Medicare Other

## 2014-11-01 DIAGNOSIS — Z5189 Encounter for other specified aftercare: Secondary | ICD-10-CM | POA: Diagnosis not present

## 2014-11-03 ENCOUNTER — Encounter (HOSPITAL_COMMUNITY)
Admission: RE | Admit: 2014-11-03 | Discharge: 2014-11-03 | Disposition: A | Discharge status: Home / Self Care | Payer: Medicare Other | Source: Ambulatory Visit | Attending: Interventional Cardiology | Admitting: Interventional Cardiology

## 2014-11-03 ENCOUNTER — Encounter (HOSPITAL_COMMUNITY): Payer: Medicare Other

## 2014-11-03 DIAGNOSIS — Z5189 Encounter for other specified aftercare: Secondary | ICD-10-CM | POA: Diagnosis not present

## 2014-11-05 ENCOUNTER — Encounter (HOSPITAL_COMMUNITY): Payer: Medicare Other

## 2014-11-08 ENCOUNTER — Encounter (HOSPITAL_COMMUNITY): Payer: Medicare Other

## 2014-11-08 ENCOUNTER — Encounter (HOSPITAL_COMMUNITY)
Admission: RE | Admit: 2014-11-08 | Discharge: 2014-11-08 | Disposition: A | Discharge status: Home / Self Care | Payer: Medicare Other | Source: Ambulatory Visit | Attending: Interventional Cardiology | Admitting: Interventional Cardiology

## 2014-11-08 DIAGNOSIS — Z5189 Encounter for other specified aftercare: Secondary | ICD-10-CM | POA: Diagnosis not present

## 2014-11-10 ENCOUNTER — Encounter (HOSPITAL_COMMUNITY): Payer: Medicare Other

## 2014-11-10 ENCOUNTER — Encounter (HOSPITAL_COMMUNITY)
Admission: RE | Admit: 2014-11-10 | Discharge: 2014-11-10 | Disposition: A | Discharge status: Home / Self Care | Payer: Medicare Other | Source: Ambulatory Visit | Attending: Interventional Cardiology | Admitting: Interventional Cardiology

## 2014-11-10 DIAGNOSIS — Z5189 Encounter for other specified aftercare: Secondary | ICD-10-CM | POA: Diagnosis not present

## 2014-11-12 ENCOUNTER — Encounter (HOSPITAL_COMMUNITY): Payer: Medicare Other

## 2014-11-12 ENCOUNTER — Encounter (HOSPITAL_COMMUNITY)
Admission: RE | Admit: 2014-11-12 | Discharge: 2014-11-12 | Disposition: A | Discharge status: Home / Self Care | Payer: Medicare Other | Source: Ambulatory Visit | Attending: Interventional Cardiology | Admitting: Interventional Cardiology

## 2014-11-12 DIAGNOSIS — E039 Hypothyroidism, unspecified: Secondary | ICD-10-CM | POA: Diagnosis not present

## 2014-11-12 DIAGNOSIS — Z8249 Family history of ischemic heart disease and other diseases of the circulatory system: Secondary | ICD-10-CM | POA: Insufficient documentation

## 2014-11-12 DIAGNOSIS — Z7902 Long term (current) use of antithrombotics/antiplatelets: Secondary | ICD-10-CM | POA: Diagnosis not present

## 2014-11-12 DIAGNOSIS — K219 Gastro-esophageal reflux disease without esophagitis: Secondary | ICD-10-CM | POA: Diagnosis not present

## 2014-11-12 DIAGNOSIS — I10 Essential (primary) hypertension: Secondary | ICD-10-CM | POA: Diagnosis not present

## 2014-11-12 DIAGNOSIS — I2510 Atherosclerotic heart disease of native coronary artery without angina pectoris: Secondary | ICD-10-CM | POA: Insufficient documentation

## 2014-11-12 DIAGNOSIS — Z886 Allergy status to analgesic agent status: Secondary | ICD-10-CM | POA: Insufficient documentation

## 2014-11-12 DIAGNOSIS — R000 Tachycardia, unspecified: Secondary | ICD-10-CM | POA: Diagnosis not present

## 2014-11-12 DIAGNOSIS — E119 Type 2 diabetes mellitus without complications: Secondary | ICD-10-CM | POA: Diagnosis not present

## 2014-11-12 DIAGNOSIS — Z888 Allergy status to other drugs, medicaments and biological substances status: Secondary | ICD-10-CM | POA: Diagnosis not present

## 2014-11-12 DIAGNOSIS — Z955 Presence of coronary angioplasty implant and graft: Secondary | ICD-10-CM | POA: Diagnosis not present

## 2014-11-12 DIAGNOSIS — Z91013 Allergy to seafood: Secondary | ICD-10-CM | POA: Diagnosis not present

## 2014-11-12 DIAGNOSIS — G4730 Sleep apnea, unspecified: Secondary | ICD-10-CM | POA: Diagnosis not present

## 2014-11-12 DIAGNOSIS — Z951 Presence of aortocoronary bypass graft: Secondary | ICD-10-CM | POA: Insufficient documentation

## 2014-11-12 DIAGNOSIS — Z5189 Encounter for other specified aftercare: Secondary | ICD-10-CM | POA: Insufficient documentation

## 2014-11-12 DIAGNOSIS — Z79899 Other long term (current) drug therapy: Secondary | ICD-10-CM | POA: Diagnosis not present

## 2014-11-12 DIAGNOSIS — Z882 Allergy status to sulfonamides status: Secondary | ICD-10-CM | POA: Diagnosis not present

## 2014-11-15 ENCOUNTER — Encounter (HOSPITAL_COMMUNITY): Payer: Medicare Other

## 2014-11-15 ENCOUNTER — Encounter (HOSPITAL_COMMUNITY)
Admission: RE | Admit: 2014-11-15 | Discharge: 2014-11-15 | Disposition: A | Discharge status: Home / Self Care | Payer: Medicare Other | Source: Ambulatory Visit | Attending: Interventional Cardiology | Admitting: Interventional Cardiology

## 2014-11-15 DIAGNOSIS — Z5189 Encounter for other specified aftercare: Secondary | ICD-10-CM | POA: Diagnosis not present

## 2014-11-17 ENCOUNTER — Encounter (HOSPITAL_COMMUNITY)
Admission: RE | Admit: 2014-11-17 | Discharge: 2014-11-17 | Disposition: A | Discharge status: Home / Self Care | Payer: Medicare Other | Source: Ambulatory Visit | Attending: Interventional Cardiology | Admitting: Interventional Cardiology

## 2014-11-17 ENCOUNTER — Encounter (HOSPITAL_COMMUNITY): Payer: Medicare Other

## 2014-11-17 DIAGNOSIS — Z5189 Encounter for other specified aftercare: Secondary | ICD-10-CM | POA: Diagnosis not present

## 2014-11-19 ENCOUNTER — Encounter (HOSPITAL_COMMUNITY): Payer: Medicare Other

## 2014-11-19 ENCOUNTER — Encounter (HOSPITAL_COMMUNITY)
Admission: RE | Admit: 2014-11-19 | Discharge: 2014-11-19 | Disposition: A | Discharge status: Home / Self Care | Payer: Medicare Other | Source: Ambulatory Visit | Attending: Interventional Cardiology | Admitting: Interventional Cardiology

## 2014-11-19 DIAGNOSIS — Z5189 Encounter for other specified aftercare: Secondary | ICD-10-CM | POA: Diagnosis not present

## 2014-11-22 ENCOUNTER — Encounter (HOSPITAL_COMMUNITY): Payer: Medicare Other

## 2014-11-24 ENCOUNTER — Encounter (HOSPITAL_COMMUNITY): Payer: Medicare Other

## 2014-11-24 ENCOUNTER — Encounter (HOSPITAL_COMMUNITY)
Admission: RE | Admit: 2014-11-24 | Discharge: 2014-11-24 | Disposition: A | Discharge status: Home / Self Care | Payer: Medicare Other | Source: Ambulatory Visit | Attending: Interventional Cardiology | Admitting: Interventional Cardiology

## 2014-11-24 DIAGNOSIS — Z5189 Encounter for other specified aftercare: Secondary | ICD-10-CM | POA: Diagnosis not present

## 2014-11-26 ENCOUNTER — Encounter (HOSPITAL_COMMUNITY)
Admission: RE | Admit: 2014-11-26 | Discharge: 2014-11-26 | Disposition: A | Discharge status: Home / Self Care | Payer: Medicare Other | Source: Ambulatory Visit | Attending: Interventional Cardiology | Admitting: Interventional Cardiology

## 2014-11-26 ENCOUNTER — Encounter (HOSPITAL_COMMUNITY): Payer: Medicare Other

## 2014-11-26 DIAGNOSIS — Z5189 Encounter for other specified aftercare: Secondary | ICD-10-CM | POA: Diagnosis not present

## 2014-11-29 ENCOUNTER — Encounter (HOSPITAL_COMMUNITY)
Admission: RE | Admit: 2014-11-29 | Discharge: 2014-11-29 | Disposition: A | Discharge status: Home / Self Care | Payer: Medicare Other | Source: Ambulatory Visit | Attending: Interventional Cardiology | Admitting: Interventional Cardiology

## 2014-11-29 DIAGNOSIS — Z5189 Encounter for other specified aftercare: Secondary | ICD-10-CM | POA: Diagnosis not present

## 2014-12-01 ENCOUNTER — Encounter (HOSPITAL_COMMUNITY)
Admission: RE | Admit: 2014-12-01 | Discharge: 2014-12-01 | Disposition: A | Discharge status: Home / Self Care | Payer: Medicare Other | Source: Ambulatory Visit | Attending: Interventional Cardiology | Admitting: Interventional Cardiology

## 2014-12-01 DIAGNOSIS — Z5189 Encounter for other specified aftercare: Secondary | ICD-10-CM | POA: Diagnosis not present

## 2014-12-03 ENCOUNTER — Encounter (HOSPITAL_COMMUNITY)
Admission: RE | Admit: 2014-12-03 | Discharge: 2014-12-03 | Disposition: A | Discharge status: Home / Self Care | Payer: Medicare Other | Source: Ambulatory Visit | Attending: Interventional Cardiology | Admitting: Interventional Cardiology

## 2014-12-03 DIAGNOSIS — Z5189 Encounter for other specified aftercare: Secondary | ICD-10-CM | POA: Diagnosis not present

## 2014-12-03 NOTE — Progress Notes (Signed)
Pt will graduate from the phase II program on 4/27 with the completion of 36 exercise session.  Pt maintained excellent attendance to both exercise and to education classes.  Pt made minimal improvement with her exercise due to issues with her knees that prevented her from being able to progressively increase workloads.  Pt was unable to exercise on the stationary or recumbent bike due to the rotation of the knee.  Pt plans to continue home exercise with participating in the maintenance program here at [facility].  Pt is a long time prior participant in the maintenance program. Pt plans to exercise three times a week. Continue to encourage pt to exercise on her off days to increase her level of exercise to 6-7 days a week due to her diabetes. Pt with improvement of diabetes management which was a long term goal for pt.  Stressed the importance of diabetes management and increased activity to patient.  Pt is welcomed to continue to attend nutrition classes and education classes as a participant in the maintenance program.Repeat Psychosocial Assessment - Repeat PHQ2 score 0.  Pt demonstrates positive and appropriate coping skills, supportive family.  No further needs identified. We enjoyed working with this patient and look forward to working with her in the maintenance. Cherre Huger, BSN

## 2014-12-06 ENCOUNTER — Encounter (HOSPITAL_COMMUNITY)
Admission: RE | Admit: 2014-12-06 | Discharge: 2014-12-06 | Disposition: A | Discharge status: Home / Self Care | Payer: Medicare Other | Source: Ambulatory Visit | Attending: Interventional Cardiology | Admitting: Interventional Cardiology

## 2014-12-06 DIAGNOSIS — Z5189 Encounter for other specified aftercare: Secondary | ICD-10-CM | POA: Diagnosis not present

## 2014-12-08 ENCOUNTER — Encounter (HOSPITAL_COMMUNITY)
Admission: RE | Admit: 2014-12-08 | Discharge: 2014-12-08 | Disposition: A | Discharge status: Home / Self Care | Payer: Medicare Other | Source: Ambulatory Visit | Attending: Interventional Cardiology | Admitting: Interventional Cardiology

## 2014-12-08 DIAGNOSIS — Z5189 Encounter for other specified aftercare: Secondary | ICD-10-CM | POA: Diagnosis not present

## 2014-12-10 ENCOUNTER — Encounter (HOSPITAL_COMMUNITY): Payer: Medicare Other

## 2014-12-13 ENCOUNTER — Encounter (HOSPITAL_COMMUNITY): Payer: Medicare Other

## 2014-12-15 ENCOUNTER — Encounter (HOSPITAL_COMMUNITY): Payer: Medicare Other

## 2014-12-17 ENCOUNTER — Encounter (HOSPITAL_COMMUNITY): Payer: Medicare Other

## 2014-12-20 ENCOUNTER — Encounter (HOSPITAL_COMMUNITY): Payer: Medicare Other

## 2014-12-22 ENCOUNTER — Encounter (HOSPITAL_COMMUNITY): Payer: Medicare Other

## 2014-12-24 ENCOUNTER — Encounter (HOSPITAL_COMMUNITY): Payer: Medicare Other

## 2015-01-03 ENCOUNTER — Telehealth: Payer: Self-pay | Admitting: Interventional Cardiology

## 2015-01-03 NOTE — Telephone Encounter (Signed)
New Message      Office calling stating that pt would like to enroll in the cardiac rehab program, they faxed referral. Please call back and advise.

## 2015-01-04 NOTE — Telephone Encounter (Signed)
We have not received faxed cardiac rehab ref.  Will have Dr.Smith sign a new form, and will fax it over

## 2015-01-13 ENCOUNTER — Ambulatory Visit (INDEPENDENT_AMBULATORY_CARE_PROVIDER_SITE_OTHER): Payer: Medicare Other | Admitting: Interventional Cardiology

## 2015-01-13 ENCOUNTER — Encounter: Payer: Self-pay | Admitting: Interventional Cardiology

## 2015-01-13 VITALS — BP 136/64 | HR 92 | Ht 62.0 in | Wt 160.0 lb

## 2015-01-13 DIAGNOSIS — I25709 Atherosclerosis of coronary artery bypass graft(s), unspecified, with unspecified angina pectoris: Secondary | ICD-10-CM

## 2015-01-13 DIAGNOSIS — Z951 Presence of aortocoronary bypass graft: Secondary | ICD-10-CM

## 2015-01-13 DIAGNOSIS — E118 Type 2 diabetes mellitus with unspecified complications: Secondary | ICD-10-CM | POA: Diagnosis not present

## 2015-01-13 DIAGNOSIS — I10 Essential (primary) hypertension: Secondary | ICD-10-CM

## 2015-01-13 MED ORDER — FUROSEMIDE 80 MG PO TABS: 120.0000 mg | ORAL_TABLET | Freq: Every day | ORAL | Status: DC

## 2015-01-13 NOTE — Patient Instructions (Signed)
Medication Instructions:  Your physician recommends that you continue on your current medications as directed. Please refer to the Current Medication list given to you today.   Labwork: None   Testing/Procedures: None   Follow-Up: Your physician wants you to follow-up in: 6 months You will receive a reminder letter in the mail two months in advance. If you don't receive a letter, please call our office to schedule the follow-up appointment.   Any Other Special Instructions Will Be Listed Below (If Applicable).

## 2015-01-13 NOTE — Progress Notes (Signed)
Cardiology Office Note   Date:  01/13/2015   ID:  Susan Barnett, DOB 1945/12/01, MRN [id number]  PCP:  Shirline Frees, MD  Cardiologist:  Sinclair Grooms, MD   Chief Complaint  Patient presents with  . CORONARY ATHEROSCLEROSIS      History of Present Illness: Susan Barnett is a 69 y.o. female who presents for coronary artery disease, hypertension, hyperlipidemia, and mild obesity.  Susan Barnett is doing well. She requests to remain in cardiac rehabilitation, moving to Phase III. She complains of sternal soreness. There is also still some numbness in the lateral aspect of the left hand. She has not had angina. She denies dyspnea.    Past Medical History  Diagnosis Date  . Hypertension   . Gastric polyp   . Rectal bleeding   . Hypothyroidism   . Heartburn   . Occasional tremors     "of head", slight hands  . Hiatal hernia     mild head tremors  . Anemia     mild  . CAD (coronary artery disease)     Dr. Linard Millers follows, no problems now  . GERD (gastroesophageal reflux disease)   . Diabetes mellitus, type 2     TYPE 2  . Anginal pain     with exertion, last time prior to cardiac caht 06/02/14  . Dysrhythmia     occ skips a beat, rate was fast before beta blocker.  . Sleep apnea     cpap used nightly x 2 yrs now.  . Pneumonia   . Shortness of breath     with exertion  . Anxiety     situational  . Headache     hx of  . Arthritis   . Complication of anesthesia   . PONV (postoperative nausea and vomiting) 02/24/14    Past Surgical History  Procedure Laterality Date  . Coronary stents      x3 stents placed-prior ablation  . Elbow fracture surgery Left   . Incontinence surgery      sling  . Knee arthroscopy Right 02/24/2014    Procedure: RIGHT KNEE ARTHROSCOPY WITH DEBRIDEMENT ;  Surgeon: Gearlean Alf, MD;  Location: [facility];  Service: Orthopedics;  Laterality: Right;  . Cardiac catheterization  06/02/2014    LAD 30%, D1 80%, CFX 755, RCA > 95% ISR, rx w/  PTCA, heavy calcification, EF 65%  . Dilation and curettage of uterus    . Coronary artery bypass graft N/A 06/14/2014    Procedure: CORONARY ARTERY BYPASS GRAFTING (CABG);  Surgeon: Gaye Pollack, MD;  Location: [facility];  Service: Open Heart Surgery;  Laterality: N/A;  Times 2 using endoscopically harvested right saphenous vein.  Marland Kitchen Tee without cardioversion N/A 06/14/2014    Procedure: TRANSESOPHAGEAL ECHOCARDIOGRAM (TEE);  Surgeon: Gaye Pollack, MD;  Location: [facility];  Service: Open Heart Surgery;  Laterality: N/A;  . Left heart catheterization with coronary angiogram N/A 06/02/2014    Procedure: LEFT HEART CATHETERIZATION WITH CORONARY ANGIOGRAM;  Surgeon: Sinclair Grooms, MD;  Location: [facility] CATH LAB;  Service: Cardiovascular;  Laterality: N/A;     Current Outpatient Prescriptions  Medication Sig Dispense Refill  . amLODipine (NORVASC) 5 MG tablet Take 5 mg by mouth every morning.     Marland Kitchen atorvastatin (LIPITOR) 10 MG tablet Take 10 mg by mouth daily.    . cetirizine (ZYRTEC) 10 MG tablet Take 10 mg by mouth daily.    . cloNIDine (CATAPRES) 0.1  MG tablet Take 0.1 mg by mouth daily.    . clopidogrel (PLAVIX) 75 MG tablet Take 75 mg by mouth daily.    . Coenzyme Q10 (CO Q 10) 100 MG CAPS Take 300 mg by mouth 2 (two) times daily.    . diphenhydramine-acetaminophen (TYLENOL PM) 25-500 MG TABS Take 1 tablet by mouth at bedtime as needed (for sleep or pain).     . Exenatide ER (BYDUREON) 2 MG SUSR Inject 2 mg into the skin once a week. Sundays    . levothyroxine (SYNTHROID, LEVOTHROID) 75 MCG tablet Take 75 mcg by mouth every morning.    Marland Kitchen losartan-hydrochlorothiazide (HYZAAR) 100-12.5 MG per tablet Take 1 tablet by mouth daily. 90 tablet 3  . metoprolol (LOPRESSOR) 50 MG tablet Take 1 tablet (50 mg total) by mouth 2 (two) times daily. 180 tablet 3  . Multiple Vitamins-Minerals (MULTIVITAMIN PO) Take 1 tablet by mouth daily.    Marland Kitchen NITROSTAT 0.4 MG SL tablet Take 0.4 mg by mouth as needed. TAKE AS  DIRECTED FOR CHEST PAIN  0  . pantoprazole (PROTONIX) 40 MG tablet Take 1 tablet (40 mg total) by mouth daily before breakfast. 90 tablet 3  . potassium chloride SA (K-DUR,KLOR-CON) 20 MEQ tablet Take 20 mEq by mouth daily.  2  . sitaGLIPtin-metformin (JANUMET) 50-1000 MG per tablet Take 1 tablet by mouth 2 (two) times daily. Hold for 48 hours, restart on 06/05/2014     No current facility-administered medications for this visit.    Allergies:   Asa; Compazine; Nsaids; Shellfish allergy; Sulfa antibiotics; Zantac; and Lisinopril    Social History:  The patient  reports that she has never smoked. She has never used smokeless tobacco. She reports that she does not drink alcohol or use illicit drugs.   Family History:  The patient's family history includes Diabetes in her mother; Healthy in her brother; Heart attack in her mother; Hypertension in her mother; Lung cancer in her brother.    ROS:  Please see the history of present illness.   Otherwise, review of systems are positive for left hand numbness and chest/sternal soreness. Appetite is back to normal. No significant dyspnea..   All other systems are reviewed and negative.    PHYSICAL EXAM: VS:  BP 136/64 mmHg  Pulse 92  Ht 5\' 2"  (1.575 m)  Wt 160 lb (72.576 kg)  BMI 29.26 kg/m2  SpO2 98% , BMI Body mass index is 29.26 kg/(m^2). GEN: Well nourished, well developed, in no acute distress HEENT: normal Neck: no JVD, carotid bruits, or masses Cardiac: RRR; no murmurs, rubs, or gallops,no edema  Respiratory:  clear to auscultation bilaterally, normal work of breathing GI: soft, nontender, nondistended, + BS MS: no deformity or atrophy Skin: warm and dry, no rash Neuro:  Strength and sensation are intact Psych: euthymic mood, full affect   EKG:  EKG is not ordered today.    Recent Labs: 06/11/2014: ALT 15 06/15/2014: Magnesium 2.7* 06/16/2014: Hemoglobin 9.6*; Platelets 209 06/17/2014: BUN 8; Creatinine 0.43*; Potassium 3.7;  Sodium 133*    Lipid Panel    Component Value Date/Time   CHOL  08/13/2007 0615    116        ATP III CLASSIFICATION:  <200     mg/dL   Desirable  200-239  mg/dL   Borderline High  >=240    mg/dL   High   TRIG 69 08/13/2007 0615   HDL 41 08/13/2007 0615   CHOLHDL 2.8 08/13/2007 0615  VLDL 14 08/13/2007 0615   LDLCALC  08/13/2007 0615    61        Total Cholesterol/HDL:CHD Risk Coronary Heart Disease Risk Table                     Men   Women  1/2 Average Risk   3.4   3.3      Wt Readings from Last 3 Encounters:  01/13/15 160 lb (72.576 kg)  09/08/14 153 lb 12.8 oz (69.763 kg)  08/18/14 151 lb (68.493 kg)      Other studies Reviewed: Additional studies/ records that were reviewed today include: .    ASSESSMENT AND PLAN:  S/P CABG x 2 -still with some chest wall soreness and left lateral hand numbness  Type 2 diabetes mellitus with complications  Essential hypertension- controlled  Atherosclerosis of coronary artery bypass graft of native heart with unspecified angina pectoris-no angina     Current medicines are reviewed at length with the patient today.  The patient does not have concerns regarding medicines.  The following changes have been made:  No meds will be changed. We will facilitate continuation in the phase 3 cardiac rehabilitation program.  Labs/ tests ordered today include:  No orders of the defined types were placed in this encounter.     Disposition:   FU with HS in 6 months  Signed, Sinclair Grooms, MD  01/13/2015 11:29 AM    [facility], [place], [place]  [address] Phone: [phone]; Fax: [phone]

## 2015-01-25 ENCOUNTER — Encounter (HOSPITAL_COMMUNITY)
Admission: RE | Admit: 2015-01-25 | Discharge: 2015-01-25 | Disposition: A | Discharge status: Home / Self Care | Payer: Self-pay | Source: Ambulatory Visit | Attending: Interventional Cardiology | Admitting: Interventional Cardiology

## 2015-01-25 DIAGNOSIS — Z48812 Encounter for surgical aftercare following surgery on the circulatory system: Secondary | ICD-10-CM | POA: Insufficient documentation

## 2015-01-25 DIAGNOSIS — Z951 Presence of aortocoronary bypass graft: Secondary | ICD-10-CM | POA: Insufficient documentation

## 2015-01-27 ENCOUNTER — Encounter (HOSPITAL_COMMUNITY)
Admission: RE | Admit: 2015-01-27 | Discharge: 2015-01-27 | Disposition: A | Discharge status: Home / Self Care | Payer: Self-pay | Source: Ambulatory Visit | Attending: Interventional Cardiology | Admitting: Interventional Cardiology

## 2015-01-28 ENCOUNTER — Encounter (HOSPITAL_COMMUNITY)
Admission: RE | Admit: 2015-01-28 | Discharge: 2015-01-28 | Disposition: A | Discharge status: Home / Self Care | Payer: Self-pay | Source: Ambulatory Visit | Attending: Interventional Cardiology | Admitting: Interventional Cardiology

## 2015-02-01 ENCOUNTER — Encounter (HOSPITAL_COMMUNITY)
Admission: RE | Admit: 2015-02-01 | Discharge: 2015-02-01 | Disposition: A | Discharge status: Home / Self Care | Payer: Self-pay | Source: Ambulatory Visit | Attending: Interventional Cardiology | Admitting: Interventional Cardiology

## 2015-02-03 ENCOUNTER — Encounter (HOSPITAL_COMMUNITY)
Admission: RE | Admit: 2015-02-03 | Discharge: 2015-02-03 | Disposition: A | Discharge status: Home / Self Care | Payer: Self-pay | Source: Ambulatory Visit | Attending: Interventional Cardiology | Admitting: Interventional Cardiology

## 2015-02-04 ENCOUNTER — Encounter (HOSPITAL_COMMUNITY)
Admission: RE | Admit: 2015-02-04 | Discharge: 2015-02-04 | Disposition: A | Discharge status: Home / Self Care | Payer: Self-pay | Source: Ambulatory Visit | Attending: Interventional Cardiology | Admitting: Interventional Cardiology

## 2015-02-08 ENCOUNTER — Encounter (HOSPITAL_COMMUNITY)
Admission: RE | Admit: 2015-02-08 | Discharge: 2015-02-08 | Disposition: A | Discharge status: Home / Self Care | Payer: Self-pay | Source: Ambulatory Visit | Attending: Interventional Cardiology | Admitting: Interventional Cardiology

## 2015-02-10 ENCOUNTER — Encounter (HOSPITAL_COMMUNITY)
Admission: RE | Admit: 2015-02-10 | Discharge: 2015-02-10 | Disposition: A | Discharge status: Home / Self Care | Payer: Self-pay | Source: Ambulatory Visit | Attending: Interventional Cardiology | Admitting: Interventional Cardiology

## 2015-02-11 ENCOUNTER — Encounter (HOSPITAL_COMMUNITY)
Admission: RE | Admit: 2015-02-11 | Discharge: 2015-02-11 | Disposition: A | Discharge status: Home / Self Care | Payer: Self-pay | Source: Ambulatory Visit | Attending: Interventional Cardiology | Admitting: Interventional Cardiology

## 2015-02-11 DIAGNOSIS — Z48812 Encounter for surgical aftercare following surgery on the circulatory system: Secondary | ICD-10-CM | POA: Insufficient documentation

## 2015-02-11 DIAGNOSIS — Z951 Presence of aortocoronary bypass graft: Secondary | ICD-10-CM | POA: Insufficient documentation

## 2015-02-15 ENCOUNTER — Encounter (HOSPITAL_COMMUNITY)
Admission: RE | Admit: 2015-02-15 | Discharge: 2015-02-15 | Disposition: A | Discharge status: Home / Self Care | Payer: Self-pay | Source: Ambulatory Visit | Attending: Interventional Cardiology | Admitting: Interventional Cardiology

## 2015-02-17 ENCOUNTER — Encounter (HOSPITAL_COMMUNITY): Payer: Self-pay

## 2015-02-18 ENCOUNTER — Encounter (HOSPITAL_COMMUNITY): Payer: Self-pay

## 2015-02-22 ENCOUNTER — Encounter (HOSPITAL_COMMUNITY): Payer: Self-pay

## 2015-02-24 ENCOUNTER — Encounter (HOSPITAL_COMMUNITY): Payer: Self-pay

## 2015-02-25 ENCOUNTER — Encounter (HOSPITAL_COMMUNITY): Admission: RE | Admit: 2015-02-25 | Payer: Self-pay | Source: Ambulatory Visit

## 2015-03-01 ENCOUNTER — Encounter (HOSPITAL_COMMUNITY): Payer: Self-pay

## 2015-03-03 ENCOUNTER — Encounter (HOSPITAL_COMMUNITY): Payer: Self-pay

## 2015-03-04 ENCOUNTER — Other Ambulatory Visit: Payer: Self-pay | Admitting: Family Medicine

## 2015-03-04 ENCOUNTER — Encounter (HOSPITAL_COMMUNITY): Payer: Self-pay

## 2015-03-04 DIAGNOSIS — R1013 Epigastric pain: Secondary | ICD-10-CM

## 2015-03-07 ENCOUNTER — Ambulatory Visit
Admission: RE | Admit: 2015-03-07 | Discharge: 2015-03-07 | Disposition: A | Discharge status: Home / Self Care | Payer: Medicare Other | Source: Ambulatory Visit | Attending: Family Medicine | Admitting: Family Medicine

## 2015-03-07 DIAGNOSIS — R1013 Epigastric pain: Secondary | ICD-10-CM

## 2015-03-08 ENCOUNTER — Encounter (HOSPITAL_COMMUNITY): Payer: Self-pay

## 2015-03-10 ENCOUNTER — Encounter (HOSPITAL_COMMUNITY): Payer: Self-pay

## 2015-03-11 ENCOUNTER — Encounter (HOSPITAL_COMMUNITY): Payer: Self-pay

## 2015-03-15 ENCOUNTER — Encounter (HOSPITAL_COMMUNITY)
Admission: RE | Admit: 2015-03-15 | Discharge: 2015-03-15 | Disposition: A | Discharge status: Home / Self Care | Payer: Self-pay | Source: Ambulatory Visit | Attending: Interventional Cardiology | Admitting: Interventional Cardiology

## 2015-03-15 DIAGNOSIS — Z48812 Encounter for surgical aftercare following surgery on the circulatory system: Secondary | ICD-10-CM | POA: Insufficient documentation

## 2015-03-15 DIAGNOSIS — Z951 Presence of aortocoronary bypass graft: Secondary | ICD-10-CM | POA: Insufficient documentation

## 2015-03-17 ENCOUNTER — Encounter (HOSPITAL_COMMUNITY)
Admission: RE | Admit: 2015-03-17 | Discharge: 2015-03-17 | Disposition: A | Discharge status: Home / Self Care | Payer: Self-pay | Source: Ambulatory Visit | Attending: Interventional Cardiology | Admitting: Interventional Cardiology

## 2015-03-18 ENCOUNTER — Encounter (HOSPITAL_COMMUNITY): Payer: Self-pay

## 2015-03-22 ENCOUNTER — Encounter (HOSPITAL_COMMUNITY)
Admission: RE | Admit: 2015-03-22 | Discharge: 2015-03-22 | Disposition: A | Discharge status: Home / Self Care | Payer: Self-pay | Source: Ambulatory Visit | Attending: Interventional Cardiology | Admitting: Interventional Cardiology

## 2015-03-24 ENCOUNTER — Encounter (HOSPITAL_COMMUNITY)
Admission: RE | Admit: 2015-03-24 | Discharge: 2015-03-24 | Disposition: A | Discharge status: Home / Self Care | Payer: Self-pay | Source: Ambulatory Visit | Attending: Interventional Cardiology | Admitting: Interventional Cardiology

## 2015-03-25 ENCOUNTER — Encounter (HOSPITAL_COMMUNITY): Payer: Self-pay

## 2015-03-29 ENCOUNTER — Encounter (HOSPITAL_COMMUNITY): Payer: Self-pay

## 2015-03-31 ENCOUNTER — Encounter (HOSPITAL_COMMUNITY): Payer: Self-pay

## 2015-04-01 ENCOUNTER — Encounter (HOSPITAL_COMMUNITY): Payer: Self-pay

## 2015-04-05 ENCOUNTER — Encounter (HOSPITAL_COMMUNITY): Payer: Self-pay

## 2015-04-07 ENCOUNTER — Encounter (HOSPITAL_COMMUNITY): Payer: Self-pay

## 2015-04-08 ENCOUNTER — Encounter (HOSPITAL_COMMUNITY): Payer: Self-pay

## 2015-04-12 ENCOUNTER — Encounter (HOSPITAL_COMMUNITY): Payer: Self-pay

## 2015-04-14 ENCOUNTER — Encounter (HOSPITAL_COMMUNITY): Payer: Self-pay

## 2015-04-14 DIAGNOSIS — Z951 Presence of aortocoronary bypass graft: Secondary | ICD-10-CM | POA: Insufficient documentation

## 2015-04-14 DIAGNOSIS — Z48812 Encounter for surgical aftercare following surgery on the circulatory system: Secondary | ICD-10-CM | POA: Insufficient documentation

## 2015-04-15 ENCOUNTER — Encounter (HOSPITAL_COMMUNITY): Payer: Self-pay

## 2015-04-19 ENCOUNTER — Encounter (HOSPITAL_COMMUNITY)
Admission: RE | Admit: 2015-04-19 | Discharge: 2015-04-19 | Disposition: A | Discharge status: Home / Self Care | Payer: Self-pay | Source: Ambulatory Visit | Attending: Interventional Cardiology | Admitting: Interventional Cardiology

## 2015-04-21 ENCOUNTER — Encounter (HOSPITAL_COMMUNITY): Payer: Self-pay

## 2015-04-22 ENCOUNTER — Encounter (HOSPITAL_COMMUNITY)
Admission: RE | Admit: 2015-04-22 | Discharge: 2015-04-22 | Disposition: A | Discharge status: Home / Self Care | Payer: Self-pay | Source: Ambulatory Visit | Attending: Interventional Cardiology | Admitting: Interventional Cardiology

## 2015-04-26 ENCOUNTER — Encounter (HOSPITAL_COMMUNITY)
Admission: RE | Admit: 2015-04-26 | Discharge: 2015-04-26 | Disposition: A | Discharge status: Home / Self Care | Payer: Self-pay | Source: Ambulatory Visit | Attending: Interventional Cardiology | Admitting: Interventional Cardiology

## 2015-04-28 ENCOUNTER — Encounter (HOSPITAL_COMMUNITY): Payer: Self-pay

## 2015-04-29 ENCOUNTER — Encounter (HOSPITAL_COMMUNITY): Payer: Self-pay

## 2015-05-03 ENCOUNTER — Encounter (HOSPITAL_COMMUNITY): Payer: Self-pay

## 2015-05-05 ENCOUNTER — Encounter (HOSPITAL_COMMUNITY): Payer: Self-pay

## 2015-05-06 ENCOUNTER — Encounter (HOSPITAL_COMMUNITY): Payer: Self-pay

## 2015-05-10 ENCOUNTER — Encounter (HOSPITAL_COMMUNITY): Payer: Self-pay

## 2015-05-12 ENCOUNTER — Encounter (HOSPITAL_COMMUNITY): Payer: Self-pay

## 2015-05-13 ENCOUNTER — Encounter (HOSPITAL_COMMUNITY): Payer: Self-pay

## 2015-05-17 ENCOUNTER — Encounter (HOSPITAL_COMMUNITY): Payer: Medicare Other

## 2015-05-17 DIAGNOSIS — Z951 Presence of aortocoronary bypass graft: Secondary | ICD-10-CM | POA: Insufficient documentation

## 2015-05-17 DIAGNOSIS — Z48812 Encounter for surgical aftercare following surgery on the circulatory system: Secondary | ICD-10-CM | POA: Insufficient documentation

## 2015-05-19 ENCOUNTER — Encounter (HOSPITAL_COMMUNITY)
Admission: RE | Admit: 2015-05-19 | Discharge: 2015-05-19 | Disposition: A | Discharge status: Home / Self Care | Payer: Self-pay | Source: Ambulatory Visit | Attending: Interventional Cardiology | Admitting: Interventional Cardiology

## 2015-05-20 ENCOUNTER — Encounter (HOSPITAL_COMMUNITY)
Admission: RE | Admit: 2015-05-20 | Discharge: 2015-05-20 | Disposition: A | Discharge status: Home / Self Care | Payer: Self-pay | Source: Ambulatory Visit | Attending: Interventional Cardiology | Admitting: Interventional Cardiology

## 2015-05-24 ENCOUNTER — Encounter (HOSPITAL_COMMUNITY): Payer: Self-pay

## 2015-05-26 ENCOUNTER — Encounter (HOSPITAL_COMMUNITY): Payer: Self-pay

## 2015-05-27 ENCOUNTER — Encounter (HOSPITAL_COMMUNITY): Payer: Self-pay

## 2015-05-31 ENCOUNTER — Encounter (HOSPITAL_COMMUNITY)
Admission: RE | Admit: 2015-05-31 | Discharge: 2015-05-31 | Disposition: A | Discharge status: Home / Self Care | Payer: Self-pay | Source: Ambulatory Visit | Attending: Interventional Cardiology | Admitting: Interventional Cardiology

## 2015-06-02 ENCOUNTER — Encounter (HOSPITAL_COMMUNITY)
Admission: RE | Admit: 2015-06-02 | Discharge: 2015-06-02 | Disposition: A | Discharge status: Home / Self Care | Payer: Self-pay | Source: Ambulatory Visit | Attending: Interventional Cardiology | Admitting: Interventional Cardiology

## 2015-06-03 ENCOUNTER — Encounter (HOSPITAL_COMMUNITY)
Admission: RE | Admit: 2015-06-03 | Discharge: 2015-06-03 | Disposition: A | Discharge status: Home / Self Care | Payer: Self-pay | Source: Ambulatory Visit | Attending: Interventional Cardiology | Admitting: Interventional Cardiology

## 2015-06-07 ENCOUNTER — Encounter (HOSPITAL_COMMUNITY): Payer: Self-pay

## 2015-06-09 ENCOUNTER — Encounter (HOSPITAL_COMMUNITY): Payer: Self-pay

## 2015-06-10 ENCOUNTER — Encounter (HOSPITAL_COMMUNITY)
Admission: RE | Admit: 2015-06-10 | Discharge: 2015-06-10 | Disposition: A | Discharge status: Home / Self Care | Payer: Self-pay | Source: Ambulatory Visit | Attending: Interventional Cardiology | Admitting: Interventional Cardiology

## 2015-06-14 ENCOUNTER — Encounter (HOSPITAL_COMMUNITY)
Admission: RE | Admit: 2015-06-14 | Discharge: 2015-06-14 | Disposition: A | Discharge status: Home / Self Care | Payer: Self-pay | Source: Ambulatory Visit | Attending: Interventional Cardiology | Admitting: Interventional Cardiology

## 2015-06-14 DIAGNOSIS — Z951 Presence of aortocoronary bypass graft: Secondary | ICD-10-CM | POA: Insufficient documentation

## 2015-06-14 DIAGNOSIS — Z48812 Encounter for surgical aftercare following surgery on the circulatory system: Secondary | ICD-10-CM | POA: Insufficient documentation

## 2015-06-16 ENCOUNTER — Encounter (HOSPITAL_COMMUNITY): Payer: Self-pay

## 2015-06-17 ENCOUNTER — Encounter (HOSPITAL_COMMUNITY)
Admission: RE | Admit: 2015-06-17 | Discharge: 2015-06-17 | Disposition: A | Discharge status: Home / Self Care | Payer: Self-pay | Source: Ambulatory Visit | Attending: Interventional Cardiology | Admitting: Interventional Cardiology

## 2015-06-21 ENCOUNTER — Encounter (HOSPITAL_COMMUNITY): Payer: Self-pay

## 2015-06-23 ENCOUNTER — Encounter (HOSPITAL_COMMUNITY): Payer: Self-pay

## 2015-06-24 ENCOUNTER — Encounter (HOSPITAL_COMMUNITY): Payer: Self-pay

## 2015-06-28 ENCOUNTER — Encounter (HOSPITAL_COMMUNITY)
Admission: RE | Admit: 2015-06-28 | Discharge: 2015-06-28 | Disposition: A | Discharge status: Home / Self Care | Payer: Self-pay | Source: Ambulatory Visit | Attending: Interventional Cardiology | Admitting: Interventional Cardiology

## 2015-06-30 ENCOUNTER — Encounter (HOSPITAL_COMMUNITY): Payer: Self-pay

## 2015-07-01 ENCOUNTER — Encounter (HOSPITAL_COMMUNITY)
Admission: RE | Admit: 2015-07-01 | Discharge: 2015-07-01 | Disposition: A | Discharge status: Home / Self Care | Payer: Self-pay | Source: Ambulatory Visit | Attending: Interventional Cardiology | Admitting: Interventional Cardiology

## 2015-07-05 ENCOUNTER — Encounter (HOSPITAL_COMMUNITY): Payer: Self-pay

## 2015-07-12 ENCOUNTER — Encounter (HOSPITAL_COMMUNITY)
Admission: RE | Admit: 2015-07-12 | Discharge: 2015-07-12 | Disposition: A | Discharge status: Home / Self Care | Payer: Self-pay | Source: Ambulatory Visit | Attending: Interventional Cardiology | Admitting: Interventional Cardiology

## 2015-07-14 ENCOUNTER — Encounter (HOSPITAL_COMMUNITY)
Admission: RE | Admit: 2015-07-14 | Discharge: 2015-07-14 | Disposition: A | Discharge status: Home / Self Care | Payer: Self-pay | Source: Ambulatory Visit | Attending: Interventional Cardiology | Admitting: Interventional Cardiology

## 2015-07-14 DIAGNOSIS — Z951 Presence of aortocoronary bypass graft: Secondary | ICD-10-CM | POA: Insufficient documentation

## 2015-07-14 DIAGNOSIS — E785 Hyperlipidemia, unspecified: Secondary | ICD-10-CM | POA: Insufficient documentation

## 2015-07-14 DIAGNOSIS — Z48812 Encounter for surgical aftercare following surgery on the circulatory system: Secondary | ICD-10-CM | POA: Insufficient documentation

## 2015-07-15 ENCOUNTER — Ambulatory Visit (INDEPENDENT_AMBULATORY_CARE_PROVIDER_SITE_OTHER): Payer: Medicare Other | Admitting: Interventional Cardiology

## 2015-07-15 ENCOUNTER — Encounter: Payer: Self-pay | Admitting: Interventional Cardiology

## 2015-07-15 ENCOUNTER — Encounter (HOSPITAL_COMMUNITY): Payer: Self-pay

## 2015-07-15 ENCOUNTER — Ambulatory Visit: Payer: Medicare Other | Admitting: Interventional Cardiology

## 2015-07-15 VITALS — BP 134/78 | HR 92 | Ht 62.0 in | Wt 161.4 lb

## 2015-07-15 DIAGNOSIS — I25119 Atherosclerotic heart disease of native coronary artery with unspecified angina pectoris: Secondary | ICD-10-CM | POA: Diagnosis not present

## 2015-07-15 DIAGNOSIS — E118 Type 2 diabetes mellitus with unspecified complications: Secondary | ICD-10-CM

## 2015-07-15 DIAGNOSIS — E785 Hyperlipidemia, unspecified: Secondary | ICD-10-CM

## 2015-07-15 DIAGNOSIS — I10 Essential (primary) hypertension: Secondary | ICD-10-CM

## 2015-07-15 NOTE — Patient Instructions (Signed)
Medication Instructions:  Your physician recommends that you continue on your current medications as directed. Please refer to the Current Medication list given to you today.   Labwork: None ordered  Testing/Procedures: None ordered  Follow-Up: Your physician wants you to follow-up in: 1 YEAR WITH DR. SMITH  You will receive a reminder letter in the mail two months in advance. If you don't receive a letter, please call our office to schedule the follow-up appointment.   Any Other Special Instructions Will Be Listed Below (If Applicable).    If you need a refill on your cardiac medications before your next appointment, please call your pharmacy.   

## 2015-07-15 NOTE — Progress Notes (Signed)
Cardiology Office Note   Date:  07/15/2015   ID:  Susan Barnett, DOB 1946/07/23, MRN [id number]  PCP:  Shirline Frees, MD  Cardiologist:  Sinclair Grooms, MD   Chief Complaint  Patient presents with  . Coronary Artery Disease      History of Present Illness: Susan Barnett is a 69 y.o. female who presents for CAD, recent CABG for repeat in-stent restenosis of right coronary, hypertension, diabetes mellitus, hyperlipidemia, and obstructive sleep apnea.  Markedly improved since starting cardiac rehabilitation. No angina. No real cardiopulmonary complaints at this time.    Past Medical History  Diagnosis Date  . Hypertension   . Gastric polyp   . Rectal bleeding   . Hypothyroidism   . Heartburn   . Occasional tremors     "of head", slight hands  . Hiatal hernia     mild head tremors  . Anemia     mild  . CAD (coronary artery disease)     Dr. Linard Millers follows, no problems now  . GERD (gastroesophageal reflux disease)   . Diabetes mellitus, type 2 ([place])     TYPE 2  . Anginal pain ([place])     with exertion, last time prior to cardiac caht 06/02/14  . Dysrhythmia     occ skips a beat, rate was fast before beta blocker.  . Sleep apnea     cpap used nightly x 2 yrs now.  . Pneumonia   . Shortness of breath     with exertion  . Anxiety     situational  . Headache     hx of  . Arthritis   . Complication of anesthesia   . PONV (postoperative nausea and vomiting) 02/24/14    Past Surgical History  Procedure Laterality Date  . Coronary stents      x3 stents placed-prior ablation  . Elbow fracture surgery Left   . Incontinence surgery      sling  . Knee arthroscopy Right 02/24/2014    Procedure: RIGHT KNEE ARTHROSCOPY WITH DEBRIDEMENT ;  Surgeon: Gearlean Alf, MD;  Location: [facility];  Service: Orthopedics;  Laterality: Right;  . Cardiac catheterization  06/02/2014    LAD 30%, D1 80%, CFX 755, RCA > 95% ISR, rx w/ PTCA, heavy calcification, EF 65%  .  Dilation and curettage of uterus    . Coronary artery bypass graft N/A 06/14/2014    Procedure: CORONARY ARTERY BYPASS GRAFTING (CABG);  Surgeon: Gaye Pollack, MD;  Location: [facility];  Service: Open Heart Surgery;  Laterality: N/A;  Times 2 using endoscopically harvested right saphenous vein.  Marland Kitchen Tee without cardioversion N/A 06/14/2014    Procedure: TRANSESOPHAGEAL ECHOCARDIOGRAM (TEE);  Surgeon: Gaye Pollack, MD;  Location: [facility];  Service: Open Heart Surgery;  Laterality: N/A;  . Left heart catheterization with coronary angiogram N/A 06/02/2014    Procedure: LEFT HEART CATHETERIZATION WITH CORONARY ANGIOGRAM;  Surgeon: Sinclair Grooms, MD;  Location: [facility] CATH [facility];  Service: Cardiovascular;  Laterality: N/A;     Current Outpatient Prescriptions  Medication Sig Dispense Refill  . amLODipine (NORVASC) 5 MG tablet Take 5 mg by mouth every morning.     Marland Kitchen atorvastatin (LIPITOR) 10 MG tablet Take 10 mg by mouth daily.    . cetirizine (ZYRTEC) 10 MG tablet Take 10 mg by mouth daily.    . cloNIDine (CATAPRES) 0.1 MG tablet Take 0.1 mg by mouth daily.    . clopidogrel (PLAVIX)  75 MG tablet Take 75 mg by mouth daily.    . Coenzyme Q10 (CO Q 10) 100 MG CAPS Take 300 mg by mouth 2 (two) times daily.    . diphenhydramine-acetaminophen (TYLENOL PM) 25-500 MG TABS Take 1 tablet by mouth at bedtime as needed (for sleep or pain).     . Exenatide ER (BYDUREON) 2 MG SUSR Inject 2 mg into the skin once a week. Sundays    . JARDIANCE 25 MG TABS tablet Take 25 mg by mouth daily.  0  . levothyroxine (SYNTHROID, LEVOTHROID) 75 MCG tablet Take 75 mcg by mouth every morning.    Marland Kitchen losartan-hydrochlorothiazide (HYZAAR) 100-12.5 MG per tablet Take 1 tablet by mouth daily. 90 tablet 3  . metoprolol (LOPRESSOR) 50 MG tablet Take 1 tablet (50 mg total) by mouth 2 (two) times daily. 180 tablet 3  . Multiple Vitamins-Minerals (MULTIVITAMIN PO) Take 1 tablet by mouth daily.    Marland Kitchen NITROSTAT 0.4 MG SL tablet Take 0.4 mg by  mouth as needed. TAKE AS DIRECTED FOR CHEST PAIN  0  . pantoprazole (PROTONIX) 40 MG tablet Take 1 tablet (40 mg total) by mouth daily before breakfast. 90 tablet 3  . potassium chloride SA (K-DUR,KLOR-CON) 20 MEQ tablet Take 20 mEq by mouth daily.  2  . sitaGLIPtin-metformin (JANUMET) 50-1000 MG per tablet Take 1 tablet by mouth 2 (two) times daily. Hold for 48 hours, restart on 06/05/2014     No current facility-administered medications for this visit.    Allergies:   Asa; Compazine; Nsaids; Shellfish allergy; Sulfa antibiotics; Zantac; and Lisinopril    Social History:  The patient  reports that she has never smoked. She has never used smokeless tobacco. She reports that she does not drink alcohol or use illicit drugs.   Family History:  The patient's family history includes Diabetes in her mother; Healthy in her brother; Heart attack in her mother; Hypertension in her mother; Lung cancer in her brother.    ROS:  Please see the history of present illness.   Otherwise, review of systems are positive for shortness of breath but improving, snoring, back pain, and muscle aches..   All other systems are reviewed and negative.    PHYSICAL EXAM: VS:  BP 134/78 mmHg  Pulse 92  Ht 5\' 2"  (1.575 m)  Wt 161 lb 6.4 oz (73.211 kg)  BMI 29.51 kg/m2 , BMI Body mass index is 29.51 kg/(m^2). GEN: Well nourished, well developed, in no acute distress HEENT: normal Neck: no JVD, carotid bruits, or masses Cardiac: RRR.  There is no murmur, rub, or gallop. There is no edema. Respiratory:  clear to auscultation bilaterally, normal work of breathing. GI: soft, nontender, nondistended, + BS MS: no deformity or atrophy Skin: warm and dry, no rash Neuro:  Strength and sensation are intact Psych: euthymic mood, full affect   EKG:  EKG is ordered today. The ekg reveals normal   Recent Labs: No results found for requested labs within last 365 days.    Lipid Panel    Component Value Date/Time    CHOL  08/13/2007 0615    116        ATP III CLASSIFICATION:  <200     mg/dL   Desirable  200-239  mg/dL   Borderline High  >=240    mg/dL   High   TRIG 69 08/13/2007 0615   HDL 41 08/13/2007 0615   CHOLHDL 2.8 08/13/2007 0615   VLDL 14 08/13/2007 0615  [place]  08/13/2007 0615    61        Total Cholesterol/HDL:CHD Risk Coronary Heart Disease Risk Table                     Men   Women  1/2 Average Risk   3.4   3.3      Wt Readings from Last 3 Encounters:  07/15/15 161 lb 6.4 oz (73.211 kg)  01/13/15 160 lb (72.576 kg)  09/08/14 153 lb 12.8 oz (69.763 kg)      Other studies Reviewed: Additional studies/ records that were reviewed today include: Cardiac rehabilitation data. The findings include improving exertional tolerance.    ASSESSMENT AND PLAN:  1. Coronary artery disease with unspecified angina pectoris Asymptomatic status post bypass surgery - EKG 12-Lead  2. Essential hypertension Good control - EKG 12-Lead  3. Type 2 diabetes mellitus with complication, without long-term current use of insulin (HCC) New medication recently started to improve control  4. Hyperlipidemia LDL at target of 70    Current medicines are reviewed at length with the patient today.  The patient has the following concerns regarding medicines: Difficulty with medication affordability after starting Jardience 25 mg per day.  The following changes/actions have been instituted:    Continue aerobic exercise  Labs/ tests ordered today include:  Orders Placed This Encounter  Procedures  . EKG 12-Lead     Disposition:   FU with HS in 1 year  Signed, Sinclair Grooms, MD  07/15/2015 9:45 AM    [facility] [address], [place],   [address] Phone: [phone]; Fax: [phone]

## 2015-07-19 ENCOUNTER — Encounter (HOSPITAL_COMMUNITY): Payer: Self-pay

## 2015-07-21 ENCOUNTER — Encounter (HOSPITAL_COMMUNITY): Payer: Self-pay

## 2015-07-22 ENCOUNTER — Encounter (HOSPITAL_COMMUNITY): Payer: Self-pay

## 2015-07-26 ENCOUNTER — Encounter (HOSPITAL_COMMUNITY)
Admission: RE | Admit: 2015-07-26 | Discharge: 2015-07-26 | Disposition: A | Discharge status: Home / Self Care | Payer: Self-pay | Source: Ambulatory Visit | Attending: Interventional Cardiology | Admitting: Interventional Cardiology

## 2015-07-28 ENCOUNTER — Encounter (HOSPITAL_COMMUNITY): Payer: Self-pay

## 2015-07-29 ENCOUNTER — Encounter (HOSPITAL_COMMUNITY): Payer: Self-pay

## 2015-08-02 ENCOUNTER — Encounter (HOSPITAL_COMMUNITY): Payer: Self-pay

## 2015-08-04 ENCOUNTER — Encounter (HOSPITAL_COMMUNITY): Payer: Self-pay

## 2015-08-05 ENCOUNTER — Encounter (HOSPITAL_COMMUNITY): Payer: Self-pay

## 2015-08-09 ENCOUNTER — Encounter (HOSPITAL_COMMUNITY): Payer: Self-pay

## 2015-08-11 ENCOUNTER — Encounter (HOSPITAL_COMMUNITY): Payer: Self-pay

## 2015-08-12 ENCOUNTER — Encounter (HOSPITAL_COMMUNITY): Payer: Self-pay

## 2015-08-22 IMAGING — CR DG CHEST 2V
2 series · 2 of 2 positions shown · non-contrast
Comparison: 02/19/2014

CLINICAL DATA: Preop for heart surgery, coronary artery disease

EXAM:
CHEST  2 VIEW

[w chest pa]
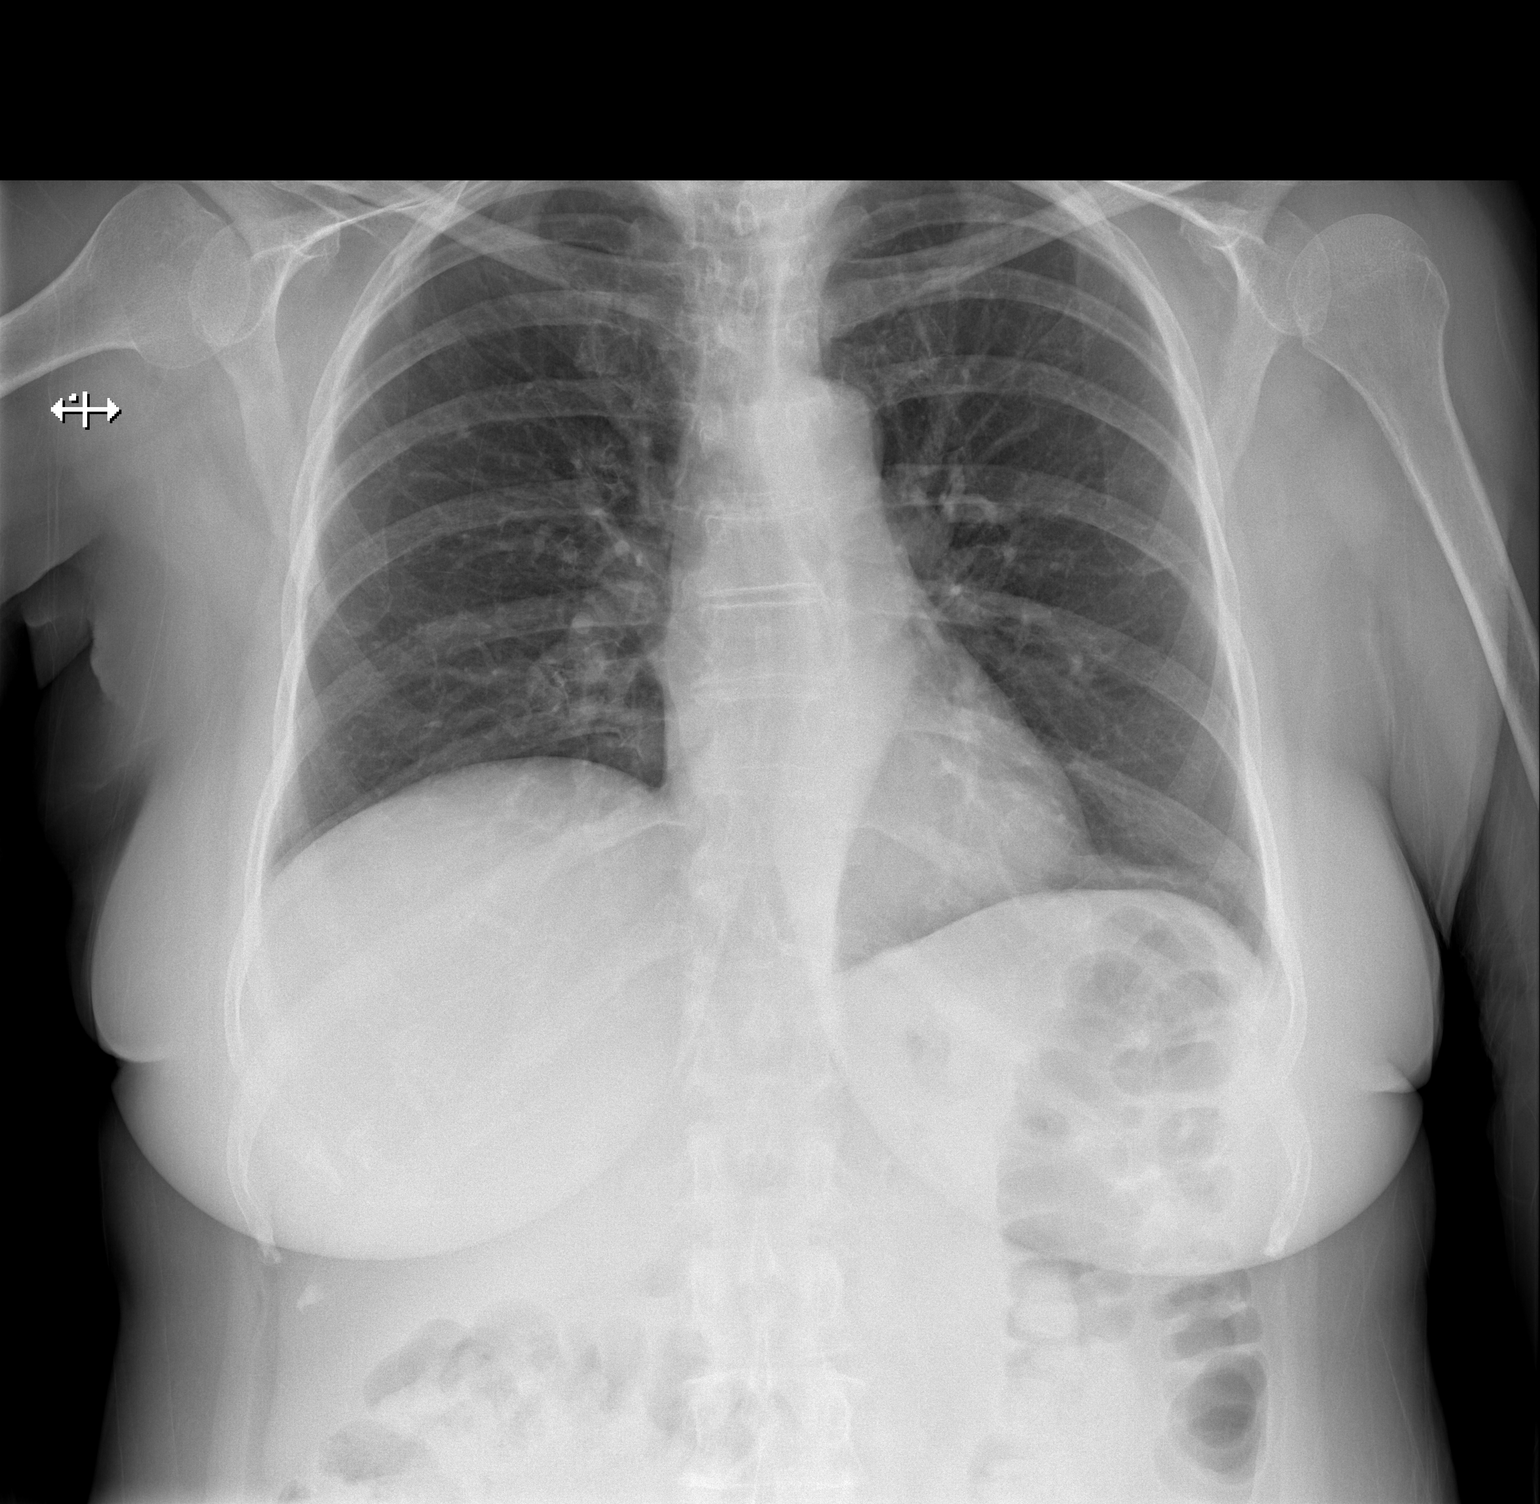

[w chest lat]
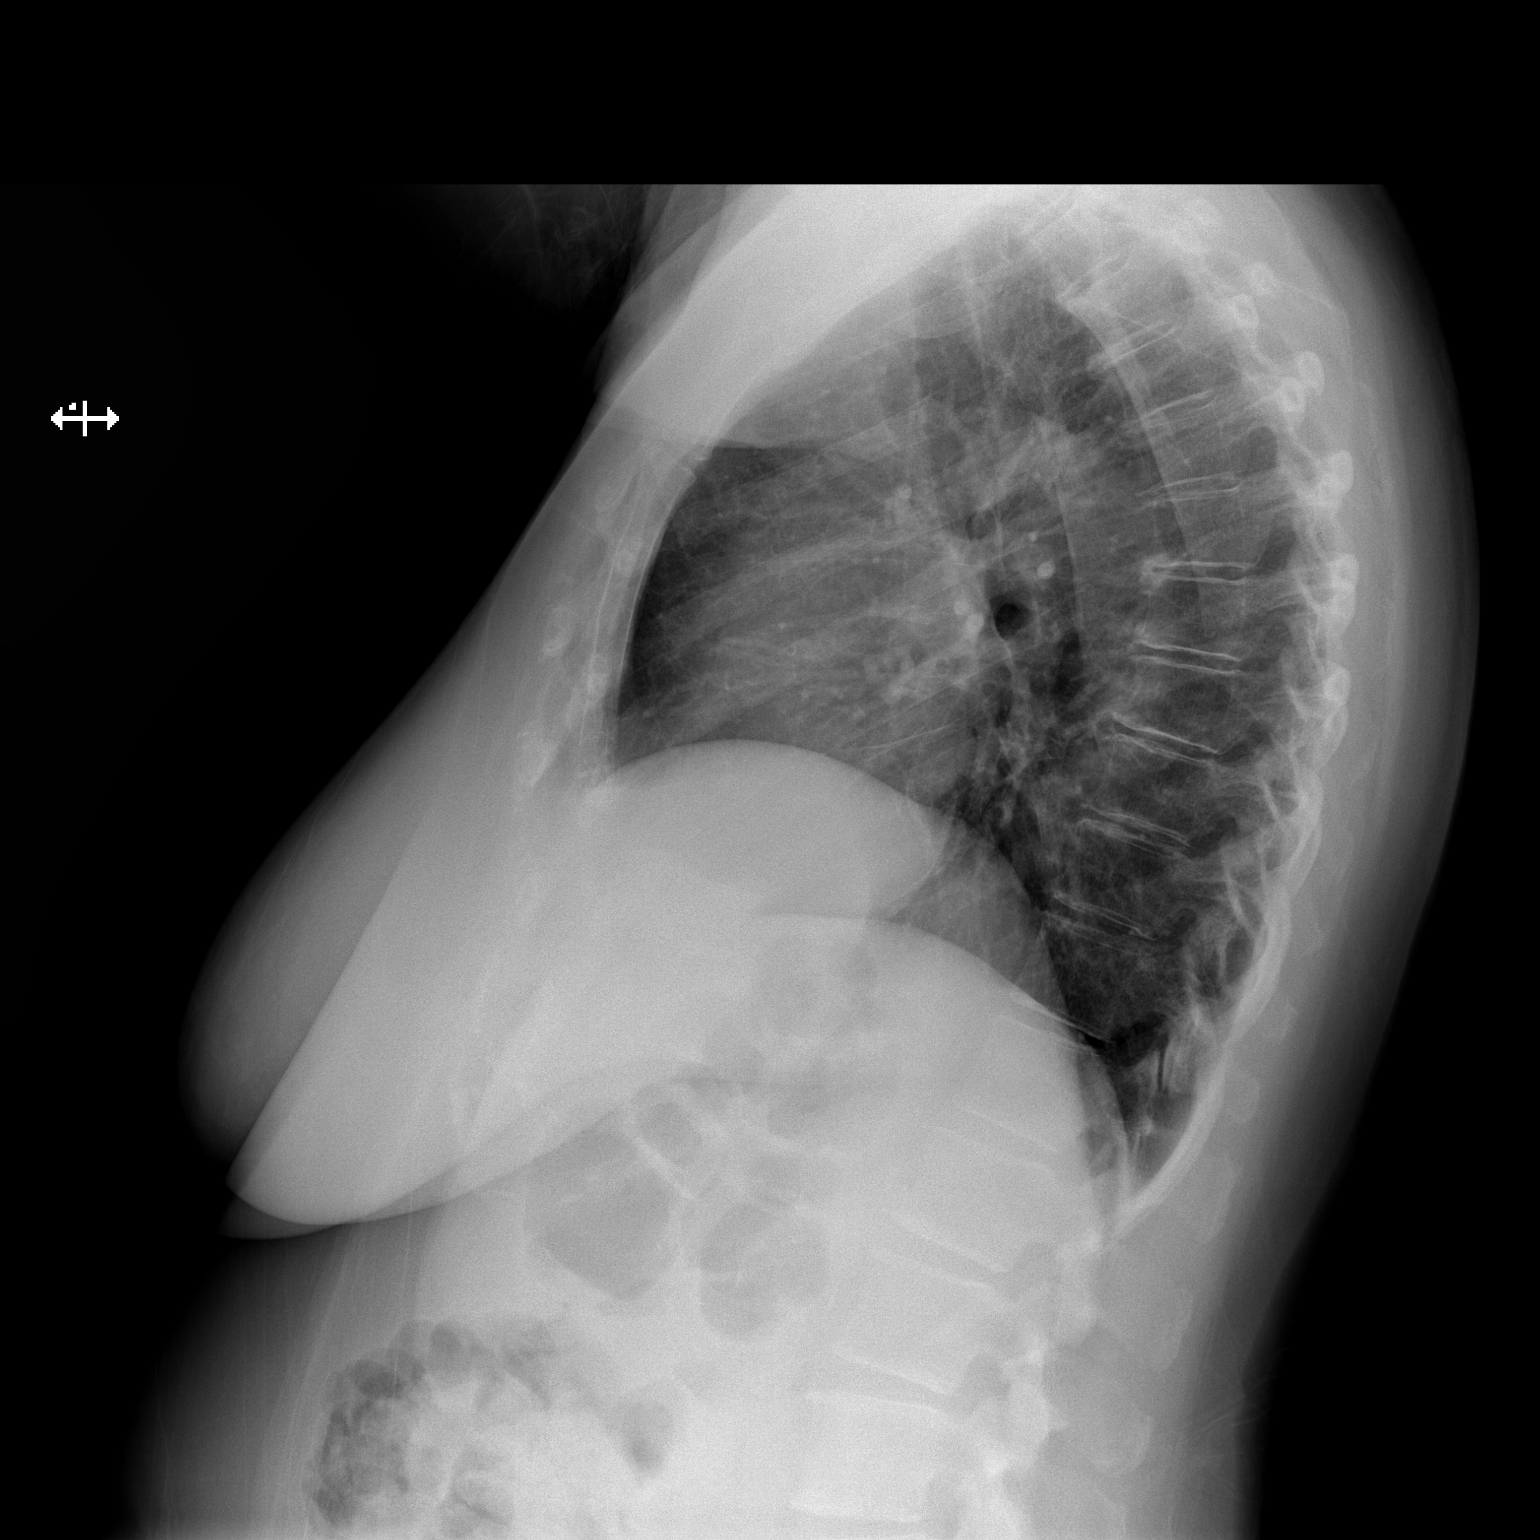

[2 of 2 positions shown; findings below may reference images not displayed]

FINDINGS: Cardiomediastinal silhouette is stable. Chronic elevation of the
right hemidiaphragm. No acute infiltrate or pleural effusion. No
pulmonary edema. Mild degenerative changes thoracic spine.
IMPRESSION: No active cardiopulmonary disease.

## 2015-08-26 ENCOUNTER — Encounter (HOSPITAL_COMMUNITY): Payer: Self-pay | Attending: Interventional Cardiology

## 2015-08-26 DIAGNOSIS — Z951 Presence of aortocoronary bypass graft: Secondary | ICD-10-CM | POA: Insufficient documentation

## 2015-08-27 IMAGING — CR DG CHEST 1V PORT
1 series · 1 of 1 positions shown · non-contrast
Comparison: 06/15/2014

CLINICAL DATA: CABG

EXAM:
PORTABLE CHEST - 1 VIEW

[AP]
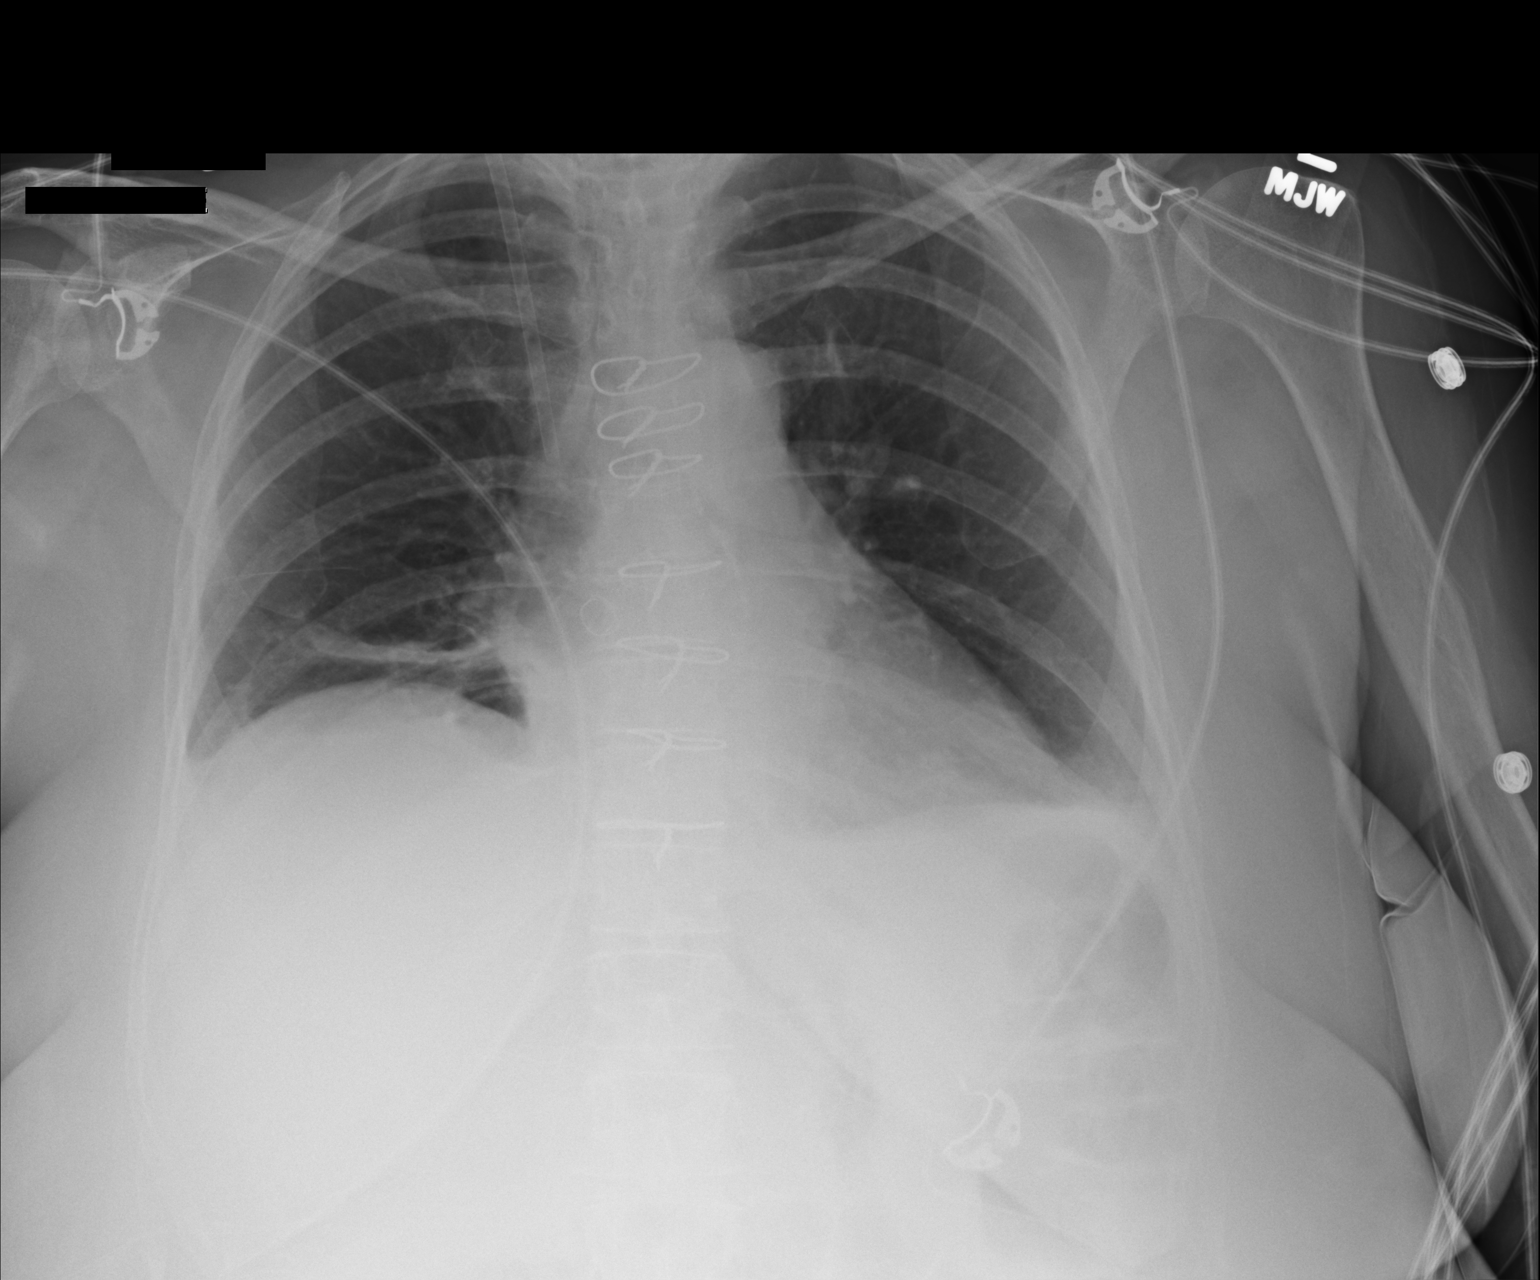

[1 of 1 positions shown; findings below may reference images not displayed]

FINDINGS: Swan-Ganz catheter has been removed. Right jugular sheath remains in
the SVC. No pneumothorax. Mediastinal and pericardial drains have
been removed.

Decreased lung volume with bibasilar atelectasis unchanged from
yesterday.
IMPRESSION: Bibasilar atelectasis unchanged. Negative for heart failure or
pneumothorax.

## 2015-08-30 ENCOUNTER — Encounter (HOSPITAL_COMMUNITY): Payer: Self-pay

## 2015-09-01 ENCOUNTER — Encounter (HOSPITAL_COMMUNITY): Payer: Self-pay

## 2015-09-02 ENCOUNTER — Encounter (HOSPITAL_COMMUNITY): Payer: Self-pay

## 2015-09-06 ENCOUNTER — Encounter (HOSPITAL_COMMUNITY): Payer: Self-pay

## 2015-09-08 ENCOUNTER — Encounter (HOSPITAL_COMMUNITY): Payer: Self-pay

## 2015-09-09 ENCOUNTER — Encounter (HOSPITAL_COMMUNITY): Payer: Self-pay

## 2015-09-13 ENCOUNTER — Encounter (HOSPITAL_COMMUNITY): Payer: Self-pay

## 2015-09-15 ENCOUNTER — Encounter (HOSPITAL_COMMUNITY)
Admission: RE | Admit: 2015-09-15 | Discharge: 2015-09-15 | Disposition: A | Discharge status: Home / Self Care | Payer: Self-pay | Source: Ambulatory Visit | Attending: Interventional Cardiology | Admitting: Interventional Cardiology

## 2015-09-15 DIAGNOSIS — Z951 Presence of aortocoronary bypass graft: Secondary | ICD-10-CM | POA: Insufficient documentation

## 2015-09-16 ENCOUNTER — Encounter (HOSPITAL_COMMUNITY)
Admission: RE | Admit: 2015-09-16 | Discharge: 2015-09-16 | Disposition: A | Discharge status: Home / Self Care | Payer: Self-pay | Source: Ambulatory Visit | Attending: Interventional Cardiology | Admitting: Interventional Cardiology

## 2015-09-20 ENCOUNTER — Encounter (HOSPITAL_COMMUNITY)
Admission: RE | Admit: 2015-09-20 | Discharge: 2015-09-20 | Disposition: A | Discharge status: Home / Self Care | Payer: Self-pay | Source: Ambulatory Visit | Attending: Interventional Cardiology | Admitting: Interventional Cardiology

## 2015-09-22 ENCOUNTER — Encounter (HOSPITAL_COMMUNITY): Payer: Self-pay

## 2015-09-23 ENCOUNTER — Encounter (HOSPITAL_COMMUNITY): Payer: Self-pay

## 2015-09-27 ENCOUNTER — Encounter (HOSPITAL_COMMUNITY)
Admission: RE | Admit: 2015-09-27 | Discharge: 2015-09-27 | Disposition: A | Discharge status: Home / Self Care | Payer: Self-pay | Source: Ambulatory Visit | Attending: Interventional Cardiology | Admitting: Interventional Cardiology

## 2015-09-29 ENCOUNTER — Encounter (HOSPITAL_COMMUNITY)
Admission: RE | Admit: 2015-09-29 | Discharge: 2015-09-29 | Disposition: A | Discharge status: Home / Self Care | Payer: Self-pay | Source: Ambulatory Visit | Attending: Interventional Cardiology | Admitting: Interventional Cardiology

## 2015-09-30 ENCOUNTER — Encounter (HOSPITAL_COMMUNITY)
Admission: RE | Admit: 2015-09-30 | Discharge: 2015-09-30 | Disposition: A | Discharge status: Home / Self Care | Payer: Self-pay | Source: Ambulatory Visit | Attending: Interventional Cardiology | Admitting: Interventional Cardiology

## 2015-10-04 ENCOUNTER — Encounter (HOSPITAL_COMMUNITY)
Admission: RE | Admit: 2015-10-04 | Discharge: 2015-10-04 | Disposition: A | Discharge status: Home / Self Care | Payer: Self-pay | Source: Ambulatory Visit | Attending: Interventional Cardiology | Admitting: Interventional Cardiology

## 2015-10-06 ENCOUNTER — Encounter (HOSPITAL_COMMUNITY): Payer: Self-pay

## 2015-10-07 ENCOUNTER — Encounter (HOSPITAL_COMMUNITY): Payer: Self-pay

## 2015-10-11 ENCOUNTER — Encounter (HOSPITAL_COMMUNITY): Payer: Self-pay

## 2015-10-13 ENCOUNTER — Encounter (HOSPITAL_COMMUNITY): Payer: Self-pay | Attending: Interventional Cardiology

## 2015-10-13 DIAGNOSIS — Z951 Presence of aortocoronary bypass graft: Secondary | ICD-10-CM | POA: Insufficient documentation

## 2015-10-14 ENCOUNTER — Encounter (HOSPITAL_COMMUNITY): Payer: Self-pay

## 2015-10-18 ENCOUNTER — Encounter (HOSPITAL_COMMUNITY): Payer: Self-pay

## 2015-10-20 ENCOUNTER — Encounter (HOSPITAL_COMMUNITY): Payer: Self-pay

## 2015-10-21 ENCOUNTER — Encounter (HOSPITAL_COMMUNITY): Payer: Self-pay

## 2015-10-25 ENCOUNTER — Encounter (HOSPITAL_COMMUNITY): Payer: Self-pay

## 2015-10-27 ENCOUNTER — Encounter (HOSPITAL_COMMUNITY): Payer: Self-pay

## 2015-10-28 ENCOUNTER — Encounter (HOSPITAL_COMMUNITY): Payer: Self-pay

## 2015-11-01 ENCOUNTER — Encounter (HOSPITAL_COMMUNITY): Payer: Self-pay

## 2015-11-03 ENCOUNTER — Encounter (HOSPITAL_COMMUNITY): Payer: Self-pay

## 2015-11-04 ENCOUNTER — Encounter (HOSPITAL_COMMUNITY): Payer: Self-pay

## 2015-11-08 ENCOUNTER — Encounter (HOSPITAL_COMMUNITY): Payer: Self-pay

## 2015-11-10 ENCOUNTER — Encounter (HOSPITAL_COMMUNITY): Payer: Self-pay

## 2015-11-11 ENCOUNTER — Encounter (HOSPITAL_COMMUNITY): Admission: RE | Admit: 2015-11-11 | Payer: Self-pay | Source: Ambulatory Visit

## 2015-11-11 ENCOUNTER — Telehealth (HOSPITAL_COMMUNITY): Payer: Self-pay | Admitting: *Deleted

## 2015-11-14 DIAGNOSIS — M170 Bilateral primary osteoarthritis of knee: Secondary | ICD-10-CM | POA: Diagnosis not present

## 2015-11-14 DIAGNOSIS — M1712 Unilateral primary osteoarthritis, left knee: Secondary | ICD-10-CM | POA: Diagnosis not present

## 2015-11-15 ENCOUNTER — Encounter (HOSPITAL_COMMUNITY): Payer: Self-pay | Attending: Interventional Cardiology

## 2015-11-15 DIAGNOSIS — Z951 Presence of aortocoronary bypass graft: Secondary | ICD-10-CM | POA: Insufficient documentation

## 2015-11-17 ENCOUNTER — Encounter (HOSPITAL_COMMUNITY): Payer: Self-pay

## 2015-11-18 ENCOUNTER — Encounter (HOSPITAL_COMMUNITY): Payer: Self-pay

## 2015-11-22 ENCOUNTER — Encounter (HOSPITAL_COMMUNITY): Payer: Self-pay

## 2015-11-24 ENCOUNTER — Encounter (HOSPITAL_COMMUNITY): Payer: Self-pay

## 2015-11-25 ENCOUNTER — Encounter (HOSPITAL_COMMUNITY): Payer: Self-pay

## 2015-11-29 ENCOUNTER — Encounter (HOSPITAL_COMMUNITY): Payer: Self-pay

## 2015-12-01 ENCOUNTER — Encounter (HOSPITAL_COMMUNITY): Payer: Self-pay

## 2015-12-02 ENCOUNTER — Encounter (HOSPITAL_COMMUNITY): Payer: Self-pay

## 2015-12-06 ENCOUNTER — Encounter (HOSPITAL_COMMUNITY): Payer: Self-pay

## 2015-12-08 ENCOUNTER — Encounter (HOSPITAL_COMMUNITY): Payer: Self-pay

## 2015-12-09 ENCOUNTER — Encounter (HOSPITAL_COMMUNITY): Payer: Self-pay

## 2015-12-13 ENCOUNTER — Encounter (HOSPITAL_COMMUNITY): Payer: Self-pay

## 2015-12-15 ENCOUNTER — Encounter (HOSPITAL_COMMUNITY): Payer: Self-pay

## 2015-12-16 ENCOUNTER — Encounter (HOSPITAL_COMMUNITY): Payer: Self-pay

## 2015-12-20 ENCOUNTER — Encounter (HOSPITAL_COMMUNITY): Payer: Self-pay

## 2015-12-20 DIAGNOSIS — I10 Essential (primary) hypertension: Secondary | ICD-10-CM | POA: Diagnosis not present

## 2015-12-20 DIAGNOSIS — K219 Gastro-esophageal reflux disease without esophagitis: Secondary | ICD-10-CM | POA: Diagnosis not present

## 2015-12-20 DIAGNOSIS — B356 Tinea cruris: Secondary | ICD-10-CM | POA: Diagnosis not present

## 2015-12-20 DIAGNOSIS — E785 Hyperlipidemia, unspecified: Secondary | ICD-10-CM | POA: Diagnosis not present

## 2015-12-20 DIAGNOSIS — E1165 Type 2 diabetes mellitus with hyperglycemia: Secondary | ICD-10-CM | POA: Diagnosis not present

## 2015-12-20 DIAGNOSIS — E039 Hypothyroidism, unspecified: Secondary | ICD-10-CM | POA: Diagnosis not present

## 2015-12-20 DIAGNOSIS — Z7984 Long term (current) use of oral hypoglycemic drugs: Secondary | ICD-10-CM | POA: Diagnosis not present

## 2015-12-22 ENCOUNTER — Encounter (HOSPITAL_COMMUNITY): Payer: Self-pay

## 2015-12-22 DIAGNOSIS — L820 Inflamed seborrheic keratosis: Secondary | ICD-10-CM | POA: Diagnosis not present

## 2015-12-22 DIAGNOSIS — L918 Other hypertrophic disorders of the skin: Secondary | ICD-10-CM | POA: Diagnosis not present

## 2015-12-22 DIAGNOSIS — L821 Other seborrheic keratosis: Secondary | ICD-10-CM | POA: Diagnosis not present

## 2015-12-22 DIAGNOSIS — D225 Melanocytic nevi of trunk: Secondary | ICD-10-CM | POA: Diagnosis not present

## 2015-12-22 DIAGNOSIS — D1800 Hemangioma unspecified site: Secondary | ICD-10-CM | POA: Diagnosis not present

## 2015-12-23 ENCOUNTER — Encounter (HOSPITAL_COMMUNITY): Payer: Self-pay

## 2015-12-27 ENCOUNTER — Encounter (HOSPITAL_COMMUNITY): Payer: Self-pay

## 2015-12-29 ENCOUNTER — Encounter (HOSPITAL_COMMUNITY): Payer: Self-pay

## 2015-12-30 ENCOUNTER — Encounter (HOSPITAL_COMMUNITY): Payer: Self-pay

## 2016-01-03 ENCOUNTER — Encounter (HOSPITAL_COMMUNITY): Payer: Self-pay

## 2016-01-05 ENCOUNTER — Encounter (HOSPITAL_COMMUNITY): Payer: Self-pay

## 2016-01-06 ENCOUNTER — Encounter (HOSPITAL_COMMUNITY): Payer: Self-pay

## 2016-01-10 ENCOUNTER — Encounter (HOSPITAL_COMMUNITY): Payer: Self-pay

## 2016-01-12 ENCOUNTER — Encounter (HOSPITAL_COMMUNITY): Payer: Self-pay

## 2016-01-13 ENCOUNTER — Encounter (HOSPITAL_COMMUNITY): Payer: Self-pay

## 2016-01-17 ENCOUNTER — Encounter (HOSPITAL_COMMUNITY): Payer: Self-pay

## 2016-01-19 ENCOUNTER — Encounter (HOSPITAL_COMMUNITY): Payer: Self-pay

## 2016-01-20 ENCOUNTER — Encounter (HOSPITAL_COMMUNITY): Payer: Self-pay

## 2016-01-24 ENCOUNTER — Encounter (HOSPITAL_COMMUNITY): Payer: Self-pay

## 2016-01-26 ENCOUNTER — Encounter (HOSPITAL_COMMUNITY): Payer: Self-pay

## 2016-01-27 ENCOUNTER — Encounter (HOSPITAL_COMMUNITY): Payer: Self-pay

## 2016-01-31 ENCOUNTER — Encounter (HOSPITAL_COMMUNITY): Payer: Self-pay

## 2016-02-02 ENCOUNTER — Encounter (HOSPITAL_COMMUNITY): Payer: Self-pay

## 2016-02-02 DIAGNOSIS — R0789 Other chest pain: Secondary | ICD-10-CM | POA: Diagnosis not present

## 2016-02-03 ENCOUNTER — Encounter (HOSPITAL_COMMUNITY): Payer: Self-pay

## 2016-02-07 ENCOUNTER — Encounter (HOSPITAL_COMMUNITY): Payer: Self-pay

## 2016-02-09 ENCOUNTER — Encounter (HOSPITAL_COMMUNITY): Payer: Self-pay

## 2016-02-10 ENCOUNTER — Encounter (HOSPITAL_COMMUNITY): Payer: Self-pay

## 2016-02-14 ENCOUNTER — Encounter (HOSPITAL_COMMUNITY): Payer: Self-pay

## 2016-02-16 ENCOUNTER — Encounter (HOSPITAL_COMMUNITY): Payer: Self-pay

## 2016-02-16 DIAGNOSIS — G4733 Obstructive sleep apnea (adult) (pediatric): Secondary | ICD-10-CM | POA: Diagnosis not present

## 2016-02-17 ENCOUNTER — Encounter (HOSPITAL_COMMUNITY): Payer: Self-pay

## 2016-02-19 DIAGNOSIS — R102 Pelvic and perineal pain: Secondary | ICD-10-CM | POA: Diagnosis not present

## 2016-02-19 DIAGNOSIS — N898 Other specified noninflammatory disorders of vagina: Secondary | ICD-10-CM | POA: Diagnosis not present

## 2016-02-21 ENCOUNTER — Encounter (HOSPITAL_COMMUNITY): Payer: Self-pay

## 2016-02-23 ENCOUNTER — Encounter (HOSPITAL_COMMUNITY): Payer: Self-pay

## 2016-02-24 ENCOUNTER — Encounter (HOSPITAL_COMMUNITY): Payer: Self-pay

## 2016-02-28 ENCOUNTER — Encounter (HOSPITAL_COMMUNITY): Payer: Self-pay

## 2016-03-01 ENCOUNTER — Encounter (HOSPITAL_COMMUNITY): Payer: Self-pay

## 2016-03-02 ENCOUNTER — Encounter (HOSPITAL_COMMUNITY): Payer: Self-pay

## 2016-03-06 ENCOUNTER — Encounter (HOSPITAL_COMMUNITY): Payer: Self-pay

## 2016-03-08 ENCOUNTER — Encounter (HOSPITAL_COMMUNITY): Payer: Self-pay

## 2016-03-09 ENCOUNTER — Encounter (HOSPITAL_COMMUNITY): Payer: Self-pay

## 2016-03-28 DIAGNOSIS — D649 Anemia, unspecified: Secondary | ICD-10-CM | POA: Diagnosis not present

## 2016-03-28 DIAGNOSIS — I10 Essential (primary) hypertension: Secondary | ICD-10-CM | POA: Diagnosis not present

## 2016-03-28 DIAGNOSIS — E785 Hyperlipidemia, unspecified: Secondary | ICD-10-CM | POA: Diagnosis not present

## 2016-03-28 DIAGNOSIS — K219 Gastro-esophageal reflux disease without esophagitis: Secondary | ICD-10-CM | POA: Diagnosis not present

## 2016-03-28 DIAGNOSIS — L309 Dermatitis, unspecified: Secondary | ICD-10-CM | POA: Diagnosis not present

## 2016-03-28 DIAGNOSIS — E039 Hypothyroidism, unspecified: Secondary | ICD-10-CM | POA: Diagnosis not present

## 2016-03-28 DIAGNOSIS — E1165 Type 2 diabetes mellitus with hyperglycemia: Secondary | ICD-10-CM | POA: Diagnosis not present

## 2016-04-27 DIAGNOSIS — Z23 Encounter for immunization: Secondary | ICD-10-CM | POA: Diagnosis not present

## 2016-05-02 DIAGNOSIS — N760 Acute vaginitis: Secondary | ICD-10-CM | POA: Diagnosis not present

## 2016-05-31 DIAGNOSIS — H5213 Myopia, bilateral: Secondary | ICD-10-CM | POA: Diagnosis not present

## 2016-05-31 DIAGNOSIS — H52223 Regular astigmatism, bilateral: Secondary | ICD-10-CM | POA: Diagnosis not present

## 2016-05-31 DIAGNOSIS — H524 Presbyopia: Secondary | ICD-10-CM | POA: Diagnosis not present

## 2016-05-31 DIAGNOSIS — E119 Type 2 diabetes mellitus without complications: Secondary | ICD-10-CM | POA: Diagnosis not present

## 2016-05-31 DIAGNOSIS — H04123 Dry eye syndrome of bilateral lacrimal glands: Secondary | ICD-10-CM | POA: Diagnosis not present

## 2016-06-28 DIAGNOSIS — G4733 Obstructive sleep apnea (adult) (pediatric): Secondary | ICD-10-CM | POA: Diagnosis not present

## 2016-06-28 DIAGNOSIS — K219 Gastro-esophageal reflux disease without esophagitis: Secondary | ICD-10-CM | POA: Diagnosis not present

## 2016-06-28 DIAGNOSIS — I10 Essential (primary) hypertension: Secondary | ICD-10-CM | POA: Diagnosis not present

## 2016-06-28 DIAGNOSIS — I25119 Atherosclerotic heart disease of native coronary artery with unspecified angina pectoris: Secondary | ICD-10-CM | POA: Diagnosis not present

## 2016-06-28 DIAGNOSIS — E2839 Other primary ovarian failure: Secondary | ICD-10-CM | POA: Diagnosis not present

## 2016-06-28 DIAGNOSIS — E7800 Pure hypercholesterolemia, unspecified: Secondary | ICD-10-CM | POA: Diagnosis not present

## 2016-06-28 DIAGNOSIS — E1165 Type 2 diabetes mellitus with hyperglycemia: Secondary | ICD-10-CM | POA: Diagnosis not present

## 2016-06-28 DIAGNOSIS — D649 Anemia, unspecified: Secondary | ICD-10-CM | POA: Diagnosis not present

## 2016-06-28 DIAGNOSIS — B373 Candidiasis of vulva and vagina: Secondary | ICD-10-CM | POA: Diagnosis not present

## 2016-06-28 DIAGNOSIS — E039 Hypothyroidism, unspecified: Secondary | ICD-10-CM | POA: Diagnosis not present

## 2016-08-13 DIAGNOSIS — C50919 Malignant neoplasm of unspecified site of unspecified female breast: Secondary | ICD-10-CM

## 2016-08-13 DIAGNOSIS — Z923 Personal history of irradiation: Secondary | ICD-10-CM

## 2016-08-13 HISTORY — DX: Malignant neoplasm of unspecified site of unspecified female breast: C50.919

## 2016-08-13 HISTORY — DX: Personal history of irradiation: Z92.3

## 2016-08-24 ENCOUNTER — Other Ambulatory Visit: Payer: Self-pay | Admitting: Family Medicine

## 2016-08-24 DIAGNOSIS — Z1231 Encounter for screening mammogram for malignant neoplasm of breast: Secondary | ICD-10-CM

## 2016-09-03 DIAGNOSIS — E2839 Other primary ovarian failure: Secondary | ICD-10-CM | POA: Diagnosis not present

## 2016-09-03 DIAGNOSIS — Z780 Asymptomatic menopausal state: Secondary | ICD-10-CM | POA: Diagnosis not present

## 2016-09-20 ENCOUNTER — Ambulatory Visit
Admission: RE | Admit: 2016-09-20 | Discharge: 2016-09-20 | Disposition: A | Discharge status: Home / Self Care | Payer: Medicare Other | Source: Ambulatory Visit | Attending: Family Medicine | Admitting: Family Medicine

## 2016-09-20 DIAGNOSIS — Z1231 Encounter for screening mammogram for malignant neoplasm of breast: Secondary | ICD-10-CM | POA: Diagnosis not present

## 2016-09-24 ENCOUNTER — Other Ambulatory Visit: Payer: Self-pay | Admitting: Family Medicine

## 2016-09-24 DIAGNOSIS — R928 Other abnormal and inconclusive findings on diagnostic imaging of breast: Secondary | ICD-10-CM

## 2016-09-27 ENCOUNTER — Ambulatory Visit
Admission: RE | Admit: 2016-09-27 | Discharge: 2016-09-27 | Disposition: A | Discharge status: Home / Self Care | Payer: Medicare Other | Source: Ambulatory Visit | Attending: Family Medicine | Admitting: Family Medicine

## 2016-09-27 ENCOUNTER — Other Ambulatory Visit: Payer: Self-pay | Admitting: Family Medicine

## 2016-09-27 DIAGNOSIS — R921 Mammographic calcification found on diagnostic imaging of breast: Secondary | ICD-10-CM | POA: Diagnosis not present

## 2016-09-27 DIAGNOSIS — R928 Other abnormal and inconclusive findings on diagnostic imaging of breast: Secondary | ICD-10-CM

## 2016-09-27 DIAGNOSIS — N6321 Unspecified lump in the left breast, upper outer quadrant: Secondary | ICD-10-CM | POA: Diagnosis not present

## 2016-09-27 DIAGNOSIS — N6320 Unspecified lump in the left breast, unspecified quadrant: Secondary | ICD-10-CM

## 2016-09-28 ENCOUNTER — Other Ambulatory Visit: Payer: Self-pay | Admitting: Family Medicine

## 2016-09-28 DIAGNOSIS — I10 Essential (primary) hypertension: Secondary | ICD-10-CM | POA: Diagnosis not present

## 2016-09-28 DIAGNOSIS — N6320 Unspecified lump in the left breast, unspecified quadrant: Secondary | ICD-10-CM

## 2016-09-28 DIAGNOSIS — K219 Gastro-esophageal reflux disease without esophagitis: Secondary | ICD-10-CM | POA: Diagnosis not present

## 2016-09-28 DIAGNOSIS — D649 Anemia, unspecified: Secondary | ICD-10-CM | POA: Diagnosis not present

## 2016-09-28 DIAGNOSIS — E1165 Type 2 diabetes mellitus with hyperglycemia: Secondary | ICD-10-CM | POA: Diagnosis not present

## 2016-10-01 ENCOUNTER — Ambulatory Visit
Admission: RE | Admit: 2016-10-01 | Discharge: 2016-10-01 | Disposition: A | Discharge status: Home / Self Care | Payer: Medicare Other | Source: Ambulatory Visit | Attending: Family Medicine | Admitting: Family Medicine

## 2016-10-01 DIAGNOSIS — C50412 Malignant neoplasm of upper-outer quadrant of left female breast: Secondary | ICD-10-CM | POA: Diagnosis not present

## 2016-10-01 DIAGNOSIS — N6321 Unspecified lump in the left breast, upper outer quadrant: Secondary | ICD-10-CM | POA: Diagnosis not present

## 2016-10-01 DIAGNOSIS — N6320 Unspecified lump in the left breast, unspecified quadrant: Secondary | ICD-10-CM

## 2016-10-04 ENCOUNTER — Ambulatory Visit: Payer: Self-pay | Admitting: General Surgery

## 2016-10-04 DIAGNOSIS — C50912 Malignant neoplasm of unspecified site of left female breast: Secondary | ICD-10-CM

## 2016-10-09 ENCOUNTER — Encounter: Payer: Self-pay | Admitting: Radiation Oncology

## 2016-10-10 ENCOUNTER — Encounter: Payer: Self-pay | Admitting: Hematology and Oncology

## 2016-10-10 ENCOUNTER — Encounter: Payer: Self-pay | Admitting: Genetic Counselor

## 2016-10-12 ENCOUNTER — Ambulatory Visit: Payer: Medicare Other | Attending: General Surgery | Admitting: Physical Therapy

## 2016-10-12 DIAGNOSIS — M25512 Pain in left shoulder: Secondary | ICD-10-CM | POA: Diagnosis present

## 2016-10-12 DIAGNOSIS — R293 Abnormal posture: Secondary | ICD-10-CM | POA: Insufficient documentation

## 2016-10-12 DIAGNOSIS — C50412 Malignant neoplasm of upper-outer quadrant of left female breast: Secondary | ICD-10-CM

## 2016-10-12 NOTE — Therapy (Signed)
[place], [place], [address] Phone: [phone]   Fax:  [phone]  Physical Therapy Evaluation  Patient Details  Name: Susan Barnett MRN: [id number] Date of Birth: 03-24-1946 No Data Recorded  Encounter Date: 10/12/2016      PT End of Session - 10/12/16 1249    Visit Number 1   Number of Visits 1   PT Start Time 0940   PT Stop Time 1023   PT Time Calculation (min) 43 min   Activity Tolerance Patient tolerated treatment well   Behavior During Therapy [facility] for tasks assessed/performed      Past Medical History:  Diagnosis Date  . Anemia    mild  . Anginal pain ([place])    with exertion, last time prior to cardiac caht 06/02/14  . Anxiety    situational  . Arthritis   . CAD (coronary artery disease)    Dr. Linard Millers follows, no problems now  . Complication of anesthesia   . Diabetes mellitus, type 2 ([place])    TYPE 2  . Dysrhythmia    occ skips a beat, rate was fast before beta blocker.  . Gastric polyp   . GERD (gastroesophageal reflux disease)   . Headache    hx of  . Heartburn   . Hiatal hernia    mild head tremors  . Hypertension   . Hypothyroidism   . Occasional tremors    "of head", slight hands  . Pneumonia   . PONV (postoperative nausea and vomiting) 02/24/14  . Rectal bleeding   . Shortness of breath    with exertion  . Sleep apnea    cpap used nightly x 2 yrs now.    Past Surgical History:  Procedure Laterality Date  . BREAST BIOPSY Left 1987  . CARDIAC CATHETERIZATION  06/02/2014   LAD 30%, D1 80%, CFX 755, RCA > 95% ISR, rx w/ PTCA, heavy calcification, EF 65%  . CORONARY ARTERY BYPASS GRAFT N/A 06/14/2014   Procedure: CORONARY ARTERY BYPASS GRAFTING (CABG);  Surgeon: Gaye Pollack, MD;  Location: [place];  Service: Open Heart Surgery;  Laterality: N/A;  Times 2 using endoscopically harvested right saphenous vein.  . coronary stents     x3 stents placed-prior ablation   . DILATION AND CURETTAGE OF UTERUS    . ELBOW FRACTURE SURGERY Left   . INCONTINENCE SURGERY     sling  . KNEE ARTHROSCOPY Right 02/24/2014   Procedure: RIGHT KNEE ARTHROSCOPY WITH DEBRIDEMENT ;  Surgeon: Gearlean Alf, MD;  [facility];  Service: Orthopedics;  Laterality: Right;  . LEFT HEART CATHETERIZATION WITH CORONARY ANGIOGRAM N/A 06/02/2014   Procedure: LEFT HEART CATHETERIZATION WITH CORONARY ANGIOGRAM;  Surgeon: Sinclair Grooms, MD;  Location: [facility] CATH [facility];  Service: Cardiovascular;  Laterality: N/A;  . TEE WITHOUT CARDIOVERSION N/A 06/14/2014   Procedure: TRANSESOPHAGEAL ECHOCARDIOGRAM (TEE);  Surgeon: Gaye Pollack, MD;  Location: [place];  Service: Open Heart Surgery;  Laterality: N/A;    There were no vitals filed for this visit.       Subjective Assessment - 10/12/16 0950    Subjective "They're going to do surgery for breast cancer" and wants to prevent lymphedema.   Pertinent History     Patient Stated Goals prevent lymphedema   Currently in Pain? No/denies  but occasional left shoulder pain            [facility] PT Assessment - 10/12/16 0001  Assessment   Hand Dominance Right   Prior Therapy none     Precautions   Precautions Other (comment)   Precaution Comments active cancer     Restrictions   Weight Bearing Restrictions No     Balance Screen   Has the patient fallen in the past 6 months Yes   How many times? 1  tripped carrying in groceries   Has the patient had a decrease in activity level because of a fear of falling?  No   Is the patient reluctant to leave their home because of a fear of falling?  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   Vocation Requirements grandchildren are 64 years, 25, and 7   Leisure none since she quit cardiac rehab     Cognition   Overall Cognitive Status Within Functional Limits for tasks assessed     Posture/Postural Control   Posture/Postural Control Postural limitations   Postural Limitations Rounded Shoulders;Forward head     ROM / Strength   AROM / PROM / Strength AROM     AROM   AROM Assessment Site Shoulder   Right/Left Shoulder Right;Left   Right Shoulder Extension 63 Degrees   Right Shoulder Flexion 160 Degrees   Right Shoulder ABduction 200 Degrees   Right Shoulder Internal Rotation 72 Degrees   Right Shoulder External Rotation 90 Degrees   Left Shoulder Extension 63 Degrees   Left Shoulder Flexion 157 Degrees   Left Shoulder ABduction 185 Degrees   Left Shoulder Internal Rotation 80 Degrees   Left Shoulder External Rotation 95 Degrees     Strength   Overall Strength Comments both shoulders grossly 5-/5           LYMPHEDEMA/ONCOLOGY QUESTIONNAIRE - 10/12/16 1006      Lymphedema Assessments   Lymphedema Assessments Upper extremities     Right Upper Extremity Lymphedema   10 cm Proximal to Olecranon Process 26.9 cm   Olecranon Process 25 cm   10 cm Proximal to Ulnar Styloid Process 22.9 cm   Just Proximal to Ulnar Styloid Process 18.3 cm   Across Hand at [employer] 17.9 cm   At [place] of 2nd Digit 5.8 cm     Left Upper Extremity Lymphedema   10 cm Proximal to Olecranon Process 26.9 cm   Olecranon Process 24.7 cm   10 cm Proximal to Ulnar Styloid Process 22.4 cm   Just Proximal to Ulnar Styloid Process 17.2 cm   Across Hand at [employer] 17.1 cm   At [place] of 2nd Digit 5.5 cm                        PT Education - 10/12/16 1249    Education provided Yes   Education Details post-op shoulder ROM exercise, info on ABC class, basic info on lymphedema and her likely low risk  for this; she was also given information about the [facility] [facility] program for after treatment is completed   Person(s) Educated Patient   Methods Explanation;Handout  Comprehension Verbalized understanding              Breast Clinic Goals - 10/12/16 1257      Patient will be able to verbalize understanding of pertinent lymphedema risk reduction practices relevant to her diagnosis specifically related to skin care.   Status Partially Met     Patient will be able to return demonstrate and/or verbalize understanding of the post-op home exercise program related to regaining shoulder range of motion.   Status Achieved     Patient will be able to verbalize understanding of the importance of attending the postoperative After Breast Cancer Class for further lymphedema risk reduction education and therapeutic exercise.   Status Achieved              Plan - 10/12/16 1250    Clinical Impression Statement This is a very pleasant woman with new diagnosis of left breast cancer, early stage.  She is expected to have lumpectomy with sentinel lymph node biopsy and then radiation, though treatment plans are not yet complete.  She currently shows good shoulder AROM bilat.; mild strengthe deficit bilat in shoulders, forward head posture, and mostly small differences in arm circumference measurements except at wrist and hand, where right wrist is 1.1 cm. larger than left.  Eval is moderate complexity with comorbidities including diabetes, HTN, and cardiac history; status is evolving with pt. about to undergo breast cancer treatment. She is fearful of developing lymphedema. She also has some intermittent left shoulder pain.   Rehab Potential Excellent   PT Frequency One time visit   PT Treatment/Interventions Patient/family education   PT Next Visit Plan None at this time; patient was encouraged to attend ABC class after surgery and may need follow-up once surgery or other treatment is done.    PT Home Exercise Plan post-op shoulder ROM   Recommended Other Services attend ABC class   Consulted and Agree with Plan of Care Patient      Patient will benefit from skilled therapeutic intervention in order to improve the following deficits and impairments:  Postural dysfunction, Decreased strength, Decreased knowledge of precautions, Decreased knowledge of use of DME  Visit Diagnosis: Abnormal posture - Plan: PT plan of care cert/re-cert  Malignant neoplasm of upper-outer quadrant of left female breast, unspecified estrogen receptor status ([place]) - Plan: PT plan of care cert/re-cert  Left shoulder pain, unspecified chronicity - Plan: PT plan of care cert/re-cert      G-Codes - 94/49/67 1258    Functional Assessment Tool Used (Outpatient Only) clnical judgement   Functional Limitation Other PT primary   Other PT Primary Current Status (R9163) At least 1 percent but less than 20 percent impaired, limited or restricted   Other PT Primary Goal Status (W4665) At least 1 percent but less than 20 percent impaired, limited or restricted   Other PT Primary Discharge Status (L9357) At least 1 percent but less than 20 percent impaired, limited or restricted       Problem List Patient Active Problem List   Diagnosis Date Noted  . Hyperlipidemia 07/14/2015  . S/P CABG x 2 06/14/2014  . Angina pectoris, crescendo ([place]) 06/02/2014  . Lateral meniscal tear 02/23/2014  . GASTRIC POLYP 11/19/2007  . HYPOTHYROIDISM 11/19/2007  . Type 2 diabetes mellitus with complications ([place]) 01/77/9390  . Essential hypertension 11/19/2007  . Coronary atherosclerosis 11/19/2007  . GERD 11/19/2007  . HIATAL HERNIA 11/19/2007  . RECTAL BLEEDING 11/19/2007  . HEARTBURN 11/19/2007    Martiza Speth 10/12/2016, 1:01  PM  [place], [place], [address] Phone: [phone]   Fax:  [phone]  Name: ADELY FACER MRN:  [id number] Date of Birth: Jan 20, 1946  Serafina Royals, PT 10/12/16 1:01 PM

## 2016-10-15 ENCOUNTER — Ambulatory Visit (HOSPITAL_BASED_OUTPATIENT_CLINIC_OR_DEPARTMENT_OTHER): Payer: Medicare Other | Admitting: Hematology and Oncology

## 2016-10-15 ENCOUNTER — Encounter: Payer: Self-pay | Admitting: Hematology and Oncology

## 2016-10-15 ENCOUNTER — Encounter: Payer: Self-pay | Admitting: *Deleted

## 2016-10-15 DIAGNOSIS — Z803 Family history of malignant neoplasm of breast: Secondary | ICD-10-CM

## 2016-10-15 DIAGNOSIS — Z170 Estrogen receptor positive status [ER+]: Secondary | ICD-10-CM | POA: Diagnosis not present

## 2016-10-15 DIAGNOSIS — C50412 Malignant neoplasm of upper-outer quadrant of left female breast: Secondary | ICD-10-CM

## 2016-10-15 DIAGNOSIS — Z801 Family history of malignant neoplasm of trachea, bronchus and lung: Secondary | ICD-10-CM | POA: Diagnosis not present

## 2016-10-15 NOTE — Assessment & Plan Note (Signed)
Left breast biopsy 10/01/2016: 12:30: IDC with DCIS involving underlying papillary lesion, grade 1, ER 100%, PR 90%, Ki-67 10%, HER-2 negative ratio 1.29; irregular 1.9 cm mass axilla negative, T1 CN 0 stage IA clinical stage  Pathology and radiology counseling:Discussed with the patient, the details of pathology including the type of breast cancer,the clinical staging, the significance of ER, PR and HER-2/neu receptors and the implications for treatment. After reviewing the pathology in detail, we proceeded to discuss the different treatment options between surgery, radiation, chemotherapy, antiestrogen therapies.  Recommendations: 1. Breast conserving surgery followed by 2. Oncotype DX testing to determine if chemotherapy would be of any benefit followed by 3. Adjuvant radiation therapy followed by 4. Adjuvant antiestrogen therapy  Oncotype counseling: I discussed Oncotype DX test. I explained to the patient that this is a 21 gene panel to evaluate patient tumors DNA to calculate recurrence score. This would help determine whether patient has high risk or intermediate risk or low risk breast cancer. She understands that if her tumor was found to be high risk, she would benefit from systemic chemotherapy. If low risk, no need of chemotherapy. If she was found to be intermediate risk, we would need to evaluate the score as well as other risk factors and determine if an abbreviated chemotherapy may be of benefit.  Return to clinic after surgery to discuss final pathology report and then determine if Oncotype DX testing will need to be sent.   

## 2016-10-15 NOTE — Addendum Note (Signed)
Addended by: Jaci Carrel A on: 10/15/2016 03:50 PM   Modules accepted: Orders

## 2016-10-15 NOTE — Progress Notes (Signed)
[facility] CONSULT NOTE  Patient Care Team: Shirline Frees, MD as PCP - General (Family Medicine)  CHIEF COMPLAINTS/PURPOSE OF CONSULTATION:  Newly diagnosed breast cancer  HISTORY OF PRESENTING ILLNESS:  Susan Barnett 71 y.o. female is here because of recent diagnosis of left-sided breast cancer. Patient had a routine screening mammogram the detected abnormality in the left breast measuring 1.9 cm. The hypoechogenic area measured about 1 cm in size. She underwent ultrasound guided biopsy on 10/01/2016. Pathology revealed invasive ductal carcinoma with DCIS involving a papillary lesion. It was ER/PR positive HER-2 negative with a Ki-67 of 10%. It was also grade 1. She was seen by Dr. [name] who recommended lumpectomy. She is here today to discuss the treatment plan. She is accompanied by her husband today.  I reviewed her records extensively and collaborated the history with the patient.  SUMMARY OF ONCOLOGIC HISTORY:   Malignant neoplasm of upper-outer quadrant of left breast in female, estrogen receptor positive ([place])   10/01/2016 Initial Diagnosis    Left breast biopsy: 12:30: IDC with DCIS involving underlying papillary lesion, grade 1, ER 100%, PR 90%, Ki-67 10%, HER-2 negative ratio 1.29; irregular 1.9 cm mass axilla negative, T1 CN 0 stage IA clinical stage       MEDICAL HISTORY:  Past Medical History:  Diagnosis Date  . Anemia    mild  . Anginal pain ([place])    with exertion, last time prior to cardiac caht 06/02/14  . Anxiety    situational  . Arthritis   . CAD (coronary artery disease)    Dr. Linard Millers follows, no problems now  . Complication of anesthesia   . Diabetes mellitus, type 2 ([place])    TYPE 2  . Dysrhythmia    occ skips a beat, rate was fast before beta blocker.  . Gastric polyp   . GERD (gastroesophageal reflux disease)   . Headache    hx of  . Heartburn   . Hiatal hernia    mild head tremors  . Hypertension   . Hypothyroidism   .  Occasional tremors    "of head", slight hands  . Pneumonia   . PONV (postoperative nausea and vomiting) 02/24/14  . Rectal bleeding   . Shortness of breath    with exertion  . Sleep apnea    cpap used nightly x 2 yrs now.    SURGICAL HISTORY: Past Surgical History:  Procedure Laterality Date  . BREAST BIOPSY Left 1987  . CARDIAC CATHETERIZATION  06/02/2014   LAD 30%, D1 80%, CFX 755, RCA > 95% ISR, rx w/ PTCA, heavy calcification, EF 65%  . CORONARY ARTERY BYPASS GRAFT N/A 06/14/2014   Procedure: CORONARY ARTERY BYPASS GRAFTING (CABG);  Surgeon: Gaye Pollack, MD;  Location: [facility];  Service: Open Heart Surgery;  Laterality: N/A;  Times 2 using endoscopically harvested right saphenous vein.  . coronary stents     x3 stents placed-prior ablation  . DILATION AND CURETTAGE OF UTERUS    . ELBOW FRACTURE SURGERY Left   . INCONTINENCE SURGERY     sling  . KNEE ARTHROSCOPY Right 02/24/2014   Procedure: RIGHT KNEE ARTHROSCOPY WITH DEBRIDEMENT ;  Surgeon: Gearlean Alf, MD;  Location: [facility];  Service: Orthopedics;  Laterality: Right;  . LEFT HEART CATHETERIZATION WITH CORONARY ANGIOGRAM N/A 06/02/2014   Procedure: LEFT HEART CATHETERIZATION WITH CORONARY ANGIOGRAM;  Surgeon: Sinclair Grooms, MD;  Location[facility] C[facility];  Service: Cardiovascular;  Laterality: N/A;  .  TEE WITHOUT CARDIOVERSION N/A 06/14/2014   Procedure: TRANSESOPHAGEAL ECHOCARDIOGRAM (TEE);  Surgeon: Gaye Pollack, MD;  Location: [facility];  Service: Open Heart Surgery;  Laterality: N/A;    SOCIAL HISTORY: Social History   Social History  . Marital status: Married    Spouse name: N/A  . Number of children: N/A  . Years of education: N/A   Occupational History  . Not on file.   Social History Main Topics  . Smoking status: Never Smoker  . Smokeless tobacco: Never Used  . Alcohol use No  . Drug use: No  . Sexual activity: Not Currently   Other Topics Concern  . Not on file   Social History Narrative  . No  narrative on file    FAMILY HISTORY: Family History  Problem Relation Age of Onset  . Heart attack Mother   . Hypertension Mother   . Diabetes Mother   . Lung cancer Brother   . Healthy Brother   . Breast cancer Cousin 41    ALLERGIES:  is allergic to asa [aspirin]; compazine [prochlorperazine edisylate]; nsaids; shellfish allergy; sulfa antibiotics; zantac [ranitidine hcl]; and lisinopril.  MEDICATIONS:  Current Outpatient Prescriptions  Medication Sig Dispense Refill  . amLODipine (NORVASC) 5 MG tablet Take 5 mg by mouth every morning.     Marland Kitchen atorvastatin (LIPITOR) 10 MG tablet Take 10 mg by mouth daily.    . cetirizine (ZYRTEC) 10 MG tablet Take 10 mg by mouth daily.    . cloNIDine (CATAPRES) 0.1 MG tablet Take 0.1 mg by mouth daily.    . clopidogrel (PLAVIX) 75 MG tablet Take 75 mg by mouth daily.    . Coenzyme Q10 (CO Q 10) 100 MG CAPS Take 300 mg by mouth 2 (two) times daily.    . diphenhydramine-acetaminophen (TYLENOL PM) 25-500 MG TABS Take 1 tablet by mouth at bedtime as needed (for sleep or pain).     . Exenatide ER (BYDUREON) 2 MG SUSR Inject 2 mg into the skin once a week. Sundays    . JARDIANCE 25 MG TABS tablet Take 25 mg by mouth daily.  0  . levothyroxine (SYNTHROID, LEVOTHROID) 75 MCG tablet Take 75 mcg by mouth every morning.    Marland Kitchen losartan-hydrochlorothiazide (HYZAAR) 100-12.5 MG per tablet Take 1 tablet by mouth daily. 90 tablet 3  . metoprolol (LOPRESSOR) 50 MG tablet Take 1 tablet (50 mg total) by mouth 2 (two) times daily. 180 tablet 3  . Multiple Vitamins-Minerals (MULTIVITAMIN PO) Take 1 tablet by mouth daily.    Marland Kitchen NITROSTAT 0.4 MG SL tablet Take 0.4 mg by mouth as needed. TAKE AS DIRECTED FOR CHEST PAIN  0  . pantoprazole (PROTONIX) 40 MG tablet Take 1 tablet (40 mg total) by mouth daily before breakfast. 90 tablet 3  . potassium chloride SA (K-DUR,KLOR-CON) 20 MEQ tablet Take 20 mEq by mouth daily.  2  . sitaGLIPtin-metformin (JANUMET) 50-1000 MG per tablet  Take 1 tablet by mouth 2 (two) times daily. Hold for 48 hours, restart on 06/05/2014     No current facility-administered medications for this visit.     REVIEW OF SYSTEMS:   Constitutional: Denies fevers, chills or abnormal night sweats Eyes: Denies blurriness of vision, double vision or watery eyes Ears, nose, mouth, throat, and face: Denies mucositis or sore throat Respiratory: Denies cough, dyspnea or wheezes Cardiovascular: Denies palpitation, chest discomfort or lower extremity swelling Gastrointestinal:  Denies nausea, heartburn or change in bowel habits Skin: Denies abnormal skin rashes Lymphatics:  Denies new lymphadenopathy or easy bruising Neurological:Denies numbness, tingling or new weaknesses Behavioral/Psych: Mood is stable, no new changes  Breast:  Denies any palpable lumps or discharge All other systems were reviewed with the patient and are negative.  PHYSICAL EXAMINATION: ECOG PERFORMANCE STATUS: 0 - Asymptomatic  Vitals:   10/15/16 1240  BP: (!) 138/58  Pulse: 93  Resp: 18  Temp: 97.8 F (36.6 C)   Filed Weights   10/15/16 1240  Weight: 159 lb 12.8 oz (72.5 kg)    GENERAL:alert, no distress and comfortable SKIN: skin color, texture, turgor are normal, no rashes or significant lesions EYES: normal, conjunctiva are pink and non-injected, sclera clear OROPHARYNX:no exudate, no erythema and lips, buccal mucosa, and tongue normal  NECK: supple, thyroid normal size, non-tender, without nodularity LYMPH:  no palpable lymphadenopathy in the cervical, axillary or inguinal LUNGS: clear to auscultation and percussion with normal breathing effort HEART: regular rate & rhythm and no murmurs and no lower extremity edema ABDOMEN:abdomen soft, non-tender and normal bowel sounds Musculoskeletal:no cyanosis of digits and no clubbing  PSYCH: alert & oriented x 3 with fluent speech NEURO: no focal motor/sensory deficits BREAST: No palpable nodules in breast. No palpable  axillary or supraclavicular lymphadenopathy (exam performed in the presence of a chaperone)   LABORATORY DATA:  I have reviewed the data as listed Lab Results  Component Value Date   WBC 14.3 (H) 06/16/2014   HGB 9.6 (L) 06/16/2014   HCT 29.5 (L) 06/16/2014   MCV 81.5 06/16/2014   PLT 209 06/16/2014   Lab Results  Component Value Date   NA 133 (L) 06/17/2014   K 3.7 06/17/2014   CL 95 (L) 06/17/2014   CO2 23 06/17/2014    RADIOGRAPHIC STUDIES: I have personally reviewed the radiological reports and agreed with the findings in the report.  ASSESSMENT AND PLAN:  Malignant neoplasm of upper-outer quadrant of left breast in female, estrogen receptor positive ([place]) Left breast biopsy 10/01/2016: 12:30: IDC with DCIS involving underlying papillary lesion, grade 1, ER 100%, PR 90%, Ki-67 10%, HER-2 negative ratio 1.29; irregular 1.9 cm mass axilla negative, T1 CN 0 stage IA clinical stage  Pathology and radiology counseling:Discussed with the patient, the details of pathology including the type of breast cancer,the clinical staging, the significance of ER, PR and HER-2/neu receptors and the implications for treatment. After reviewing the pathology in detail, we proceeded to discuss the different treatment options between surgery, radiation, chemotherapy, antiestrogen therapies.  Recommendations: 1. Breast conserving surgery followed by 2. Oncotype DX testing to determine if chemotherapy would be of any benefit followed by 3. Adjuvant radiation therapy followed by 4. Adjuvant antiestrogen therapy  Oncotype counseling: I discussed Oncotype DX test. I explained to the patient that this is a 21 gene panel to evaluate patient tumors DNA to calculate recurrence score. This would help determine whether patient has high risk or intermediate risk or low risk breast cancer. She understands that if her tumor was found to be high risk, she would benefit from systemic chemotherapy. If low risk, no  need of chemotherapy. If she was found to be intermediate risk, we would need to evaluate the score as well as other risk factors and determine if an abbreviated chemotherapy may be of benefit.  Return to clinic after surgery to discuss final pathology report and then determine if Oncotype DX testing will need to be sent.   All questions were answered. The patient knows to call the clinic with any problems, questions  or concerns.    Rulon Eisenmenger, MD 10/15/16

## 2016-10-16 ENCOUNTER — Ambulatory Visit: Payer: Medicare Other | Admitting: Nurse Practitioner

## 2016-10-16 NOTE — Progress Notes (Signed)
CARDIOLOGY OFFICE NOTE  Date:  10/17/2016    Susan Barnett Date of Birth: 1946/04/25 Medical Record [id number]  PCP:  Shirline Frees, MD  Cardiologist:  Jennings Books   Chief Complaint  Patient presents with  . Pre-op Exam    Surgical clearance - seen for Dr. Tamala Julian    History of Present Illness: Susan Barnett is a 71 y.o. female who presents today for a pre op visit. Seen for Dr. Tamala Julian.   She has know CAD with prior CABG for repeat in-stent restenosis of right coronary per Dr. Cyndia Bent in 2015, hypertension, diabetes mellitus, hyperlipidemia, hypothyroidism and obstructive sleep apnea. She had had BMS of the RCA in 2002 with restenosis treated with brachytherapy - then developed edge restenosis and was treated with DES  - developed restenosis again between the stents treated with angioplasty and stenting with a long DES back in 2006.  She has not been seen since December of 2016 - felt to be doing well at that time.   Recently diagnosed with left sided breast cancer. She needs lumpectomy. Also seeing Dr. Lindi Adie of oncology.   Comes in today. Here with her husband. She has been doing ok since last seen here - was not able to get an appointment with Dr. Tamala Julian. Now needing pre op clearance. No actual chest pain. Her angina equivalent seems more shortness of breath - she is not short of breath. Not as active due to knee pain but still doing her housework, shopping. Anxious to proceed with her surgery. She is on chronic Plavix - aspirin allergic. BP ok. Her labs are checked by PCP - says her lipids are good and she was noted to be mildly anemic.   Past Medical History:  Diagnosis Date  . Anemia    mild  . Anginal pain ([place])    with exertion, last time prior to cardiac caht 06/02/14  . Anxiety    situational  . Arthritis   . CAD (coronary artery disease)    Dr. Linard Millers follows, no problems now  . Complication of anesthesia   . Diabetes mellitus, type 2 ([place])    TYPE 2   . Dysrhythmia    occ skips a beat, rate was fast before beta blocker.  . Gastric polyp   . GERD (gastroesophageal reflux disease)   . Headache    hx of  . Heartburn   . Hiatal hernia    mild head tremors  . Hypertension   . Hypothyroidism   . Occasional tremors    "of head", slight hands  . Pneumonia   . PONV (postoperative nausea and vomiting) 02/24/14  . Rectal bleeding   . Shortness of breath    with exertion  . Sleep apnea    cpap used nightly x 2 yrs now.    Past Surgical History:  Procedure Laterality Date  . BREAST BIOPSY Left 1987  . CARDIAC CATHETERIZATION  06/02/2014   LAD 30%, D1 80%, CFX 755, RCA > 95% ISR, rx w/ PTCA, heavy calcification, EF 65%  . CORONARY ARTERY BYPASS GRAFT N/A 06/14/2014   Procedure: CORONARY ARTERY BYPASS GRAFTING (CABG);  Surgeon: Gaye Pollack, MD;  Location: [facility];  Service: Open Heart Surgery;  Laterality: N/A;  Times 2 using endoscopically harvested right saphenous vein.  . coronary stents     x3 stents placed-prior ablation  . DILATION AND CURETTAGE OF UTERUS    . ELBOW FRACTURE SURGERY Left   . INCONTINENCE  SURGERY     sling  . KNEE ARTHROSCOPY Right 02/24/2014   Procedure: RIGHT KNEE ARTHROSCOPY WITH DEBRIDEMENT ;  Surgeon: Gearlean Alf, MD;  Location[facility];  Service: Orthopedics;  Laterality: Right;  . LEFT HEART CATHETERIZATION WITH CORONARY ANGIOGRAM N/A 06/02/2014   Procedure: LEFT HEART CATHETERIZATION WITH CORONARY ANGIOGRAM;  Surgeon: Sinclair Grooms, MD;  Location: [facility] CATH LAB;  Service: Cardiovascular;  Laterality: N/A;  . TEE WITHOUT CARDIOVERSION N/A 06/14/2014   Procedure: TRANSESOPHAGEAL ECHOCARDIOGRAM (TEE);  Surgeon: Gaye Pollack, MD;  Location: [place];  Service: Open Heart Surgery;  Laterality: N/A;     Medications: Current Outpatient Prescriptions  Medication Sig Dispense Refill  . amLODipine (NORVASC) 5 MG tablet Take 5 mg by mouth every morning.     Marland Kitchen atorvastatin (LIPITOR) 10 MG tablet Take 10 mg by  mouth daily.    Marland Kitchen BYDUREON 2 MG PEN Inject 2 mg into the skin once a week.   0  . cetirizine (ZYRTEC) 10 MG tablet Take 10 mg by mouth daily.    . cloNIDine (CATAPRES) 0.1 MG tablet Take 0.1 mg by mouth daily.    . clopidogrel (PLAVIX) 75 MG tablet Take 75 mg by mouth daily.    . Coenzyme Q10 (CO Q 10) 100 MG CAPS Take 300 mg by mouth 2 (two) times daily.    . diphenhydramine-acetaminophen (TYLENOL PM) 25-500 MG TABS Take 1 tablet by mouth at bedtime as needed (for sleep or pain).     . Exenatide ER (BYDUREON) 2 MG SUSR Inject 2 mg into the skin once a week. Sundays    . levothyroxine (SYNTHROID, LEVOTHROID) 75 MCG tablet Take 75 mcg by mouth every morning.    Marland Kitchen losartan-hydrochlorothiazide (HYZAAR) 100-12.5 MG per tablet Take 1 tablet by mouth daily. 90 tablet 3  . metoprolol (LOPRESSOR) 50 MG tablet Take 1 tablet (50 mg total) by mouth 2 (two) times daily. 180 tablet 3  . Multiple Vitamins-Minerals (MULTIVITAMIN PO) Take 1 tablet by mouth daily.    Marland Kitchen NITROSTAT 0.4 MG SL tablet Take 0.4 mg by mouth as needed. TAKE AS DIRECTED FOR CHEST PAIN  0  . pantoprazole (PROTONIX) 40 MG tablet Take 1 tablet (40 mg total) by mouth daily before breakfast. 90 tablet 3  . potassium chloride SA (K-DUR,KLOR-CON) 20 MEQ tablet Take 20 mEq by mouth daily.  2  . sitaGLIPtin-metformin (JANUMET) 50-1000 MG per tablet Take 1 tablet by mouth 2 (two) times daily. Hold for 48 hours, restart on 06/05/2014     No current facility-administered medications for this visit.     Allergies: Allergies  Allergen Reactions  . Asa [Aspirin] Anaphylaxis  . Compazine [Prochlorperazine Edisylate] Swelling and Other (See Comments)    Tongue swelling   . Nsaids Hives and Swelling  . Shellfish Allergy Hives and Swelling  . Sulfa Antibiotics Hives and Swelling  . Zantac [Ranitidine Hcl] Hives and Swelling  . Lisinopril Cough    Social History: The patient  reports that she has never smoked. She has never used smokeless  tobacco. She reports that she does not drink alcohol or use drugs.   Family History: The patient's family history includes Breast cancer (age of onset: 42) in her cousin; Diabetes in her mother; Healthy in her brother; Heart attack in her mother; Hypertension in her mother; Lung cancer in her brother.   Review of Systems: Please see the history of present illness.   Otherwise, the review of systems is positive for  none.   All other systems are reviewed and negative.   Physical Exam: VS:  BP 132/88   Pulse 82   Ht 5\' 2"  (1.575 m)   Wt 157 lb 6.4 oz (71.4 kg)   BMI 28.79 kg/m  .  BMI Body mass index is 28.79 kg/m.  Wt Readings from Last 3 Encounters:  10/17/16 157 lb 6.4 oz (71.4 kg)  10/15/16 159 lb 12.8 oz (72.5 kg)  07/15/15 161 lb 6.4 oz (73.2 kg)    General: Pleasant. Well developed, well nourished and in no acute distress. She does look pale. Little anxious.   HEENT: Normal.  Neck: Supple, no JVD, carotid bruits, or masses noted.  Cardiac: Regular rate and rhythm. No murmurs, rubs, or gallops. No edema.  Respiratory:  Lungs are clear to auscultation bilaterally with normal work of breathing.  GI: Soft and nontender.  MS: No deformity or atrophy. Gait and ROM intact.  Skin: Warm and dry. Color is normal.  Neuro:  Strength and sensation are intact and no gross focal deficits noted.  Psych: Alert, appropriate and with normal affect.   LABORATORY DATA:  EKG:  EKG is ordered today. This demonstrates NSR - reviewed with Dr. Tamala Julian.  Lab Results  Component Value Date   WBC 14.3 (H) 06/16/2014   HGB 9.6 (L) 06/16/2014   HCT 29.5 (L) 06/16/2014   PLT 209 06/16/2014   GLUCOSE 132 (H) 06/17/2014   CHOL  08/13/2007    116        ATP III CLASSIFICATION:  <200     mg/dL   Desirable  200-239  mg/dL   Borderline High  >=240    mg/dL   High   TRIG 69 08/13/2007   HDL 41 08/13/2007   LDLCALC  08/13/2007    61        Total Cholesterol/HDL:CHD Risk Coronary Heart Disease Risk  Table                     Men   Women  1/2 Average Risk   3.4   3.3   ALT 15 06/11/2014   AST 18 06/11/2014   NA 133 (L) 06/17/2014   K 3.7 06/17/2014   CL 95 (L) 06/17/2014   CREATININE 0.43 (L) 06/17/2014   BUN 8 06/17/2014   CO2 23 06/17/2014   INR 1.59 (H) 06/14/2014   HGBA1C 8.0 (H) 06/11/2014    BNP (last 3 results) No results for input(s): BNP in the last 8760 hours.  ProBNP (last 3 results) No results for input(s): PROBNP in the last 8760 hours.   Other Studies Reviewed Today:   Assessment/Plan:  1. Pre op clearance for lumpectomy due to newly diagnosed breast cancer - she should be an acceptable candidate for surgery. Hold Plavix 4 days prior per Dr. [name] and restart when he deems appropriate. She is aspirin allergic. Will be available as needed. Discussed with Dr. Tamala Julian - he is in agreement with this plan - ok to proceed.   2. Coronary artery disease with unspecified angina pectoris - no active symptoms reported. She is post CABG from 06/2014. Continue with CV risk factor modification  2. Essential hypertension - BP ok on current regimen. No changes made.   3. Type 2 diabetes mellitus with complication, without long-term current use of insulin ([place]) - followed by PCP.   4. Hyperlipidemia - on statin therapy - labs by PCP.   Current medicines are reviewed with the patient today.  The patient does not have concerns regarding medicines other than what has been noted above.  The following changes have been made:  See above.  Labs/ tests ordered today include:    Orders Placed This Encounter  Procedures  . EKG 12-Lead     Disposition:   FU with me or Dr. Tamala Julian in 6 months.   Patient is agreeable to this plan and will call if any problems develop in the interim.   SignedTruitt Merle, NP  10/17/2016 9:47 AM  [facility] [address] [place], [place]  [address] Phone: [phone] Fax: [phone]

## 2016-10-17 ENCOUNTER — Encounter: Payer: Self-pay | Admitting: Nurse Practitioner

## 2016-10-17 ENCOUNTER — Ambulatory Visit (INDEPENDENT_AMBULATORY_CARE_PROVIDER_SITE_OTHER): Payer: Medicare Other | Admitting: Nurse Practitioner

## 2016-10-17 VITALS — BP 132/88 | HR 82 | Ht 62.0 in | Wt 157.4 lb

## 2016-10-17 DIAGNOSIS — I10 Essential (primary) hypertension: Secondary | ICD-10-CM

## 2016-10-17 DIAGNOSIS — E7800 Pure hypercholesterolemia, unspecified: Secondary | ICD-10-CM

## 2016-10-17 DIAGNOSIS — I259 Chronic ischemic heart disease, unspecified: Secondary | ICD-10-CM | POA: Diagnosis not present

## 2016-10-17 DIAGNOSIS — Z951 Presence of aortocoronary bypass graft: Secondary | ICD-10-CM | POA: Diagnosis not present

## 2016-10-17 NOTE — Progress Notes (Signed)
Location of Breast Cancer: Left Breast  Histology per Pathology Report:  10/01/16 Breast, left, needle core biopsy, 12:30 o'clock - INVASIVE DUCTAL CARCINOMA. - DUCTAL CARCINOMA IN SITU WITH CALCIFICATIONS.  Receptor Status: ER(100%), PR (100%), Her2-neu (NEG), Ki-(10%)  Did patient present with symptoms or was this found on screening mammography?: It was found on a screening mammogram.   Past/Anticipated interventions by surgeon, if any: Dr. [name] plans lumpectomy.  Past/Anticipated interventions by medical oncology, if any:  10/15/16 Dr. Lindi Adie Recommendations: 1. Breast conserving surgery followed by 2. Oncotype DX testing to determine if chemotherapy would be of any benefit followed by 3. Adjuvant radiation therapy followed by 4. Adjuvant antiestrogen therapy  Lymphedema issues, if any:  N/A   Pain issues, if any:  She denies  SAFETY ISSUES:  Prior radiation? No  Pacemaker/ICD? No  Possible current pregnancy? No  Is the patient on methotrexate? No  Current Complaints / other details:   She reports an "itchy area" to her above and below her Left Nipple.. She mentioned this to her GYN years ago and they recommended hydrocortisone cream which will help it heal. She currently has the area present and wanted to mention it today.   BP 134/64   Pulse 95   Temp 97.8 F (36.6 C)   Ht 5' 2"  (1.575 m)   Wt 152 lb 6.4 oz (69.1 kg)   SpO2 100% Comment: room air  BMI 27.87 kg/m    Wt Readings from Last 3 Encounters:  10/19/16 152 lb 6.4 oz (69.1 kg)  10/17/16 157 lb 6.4 oz (71.4 kg)  10/15/16 159 lb 12.8 oz (72.5 kg)      Moataz Tavis, Stephani Police, RN 10/17/2016,4:58 PM

## 2016-10-17 NOTE — Patient Instructions (Addendum)
We will be checking the following labs today - NONE   Medication Instructions:    Continue with your current medicines.   Ok to hold your Plavix 4 days prior to your surgery.     Testing/Procedures To Be Arranged:  N/A  Follow-Up:   See Dr. Tamala Julian or me in 6 months    Other Special Instructions:   I have sent a note to Dr. [name] - call his office later today to schedule your surgery    If you need a refill on your cardiac medications before your next appointment, please call your pharmacy.   Call the [place] office at [phone] if you have any questions, problems or concerns.

## 2016-10-19 ENCOUNTER — Ambulatory Visit
Admission: RE | Admit: 2016-10-19 | Discharge: 2016-10-19 | Disposition: A | Discharge status: Home / Self Care | Payer: Medicare Other | Source: Ambulatory Visit | Attending: Radiation Oncology | Admitting: Radiation Oncology

## 2016-10-19 ENCOUNTER — Encounter: Payer: Self-pay | Admitting: Radiation Oncology

## 2016-10-19 DIAGNOSIS — G4730 Sleep apnea, unspecified: Secondary | ICD-10-CM | POA: Insufficient documentation

## 2016-10-19 DIAGNOSIS — E039 Hypothyroidism, unspecified: Secondary | ICD-10-CM | POA: Insufficient documentation

## 2016-10-19 DIAGNOSIS — Z794 Long term (current) use of insulin: Secondary | ICD-10-CM | POA: Insufficient documentation

## 2016-10-19 DIAGNOSIS — F419 Anxiety disorder, unspecified: Secondary | ICD-10-CM | POA: Insufficient documentation

## 2016-10-19 DIAGNOSIS — Z91013 Allergy to seafood: Secondary | ICD-10-CM | POA: Diagnosis not present

## 2016-10-19 DIAGNOSIS — I10 Essential (primary) hypertension: Secondary | ICD-10-CM | POA: Insufficient documentation

## 2016-10-19 DIAGNOSIS — K219 Gastro-esophageal reflux disease without esophagitis: Secondary | ICD-10-CM | POA: Diagnosis not present

## 2016-10-19 DIAGNOSIS — C50412 Malignant neoplasm of upper-outer quadrant of left female breast: Secondary | ICD-10-CM

## 2016-10-19 DIAGNOSIS — Z79899 Other long term (current) drug therapy: Secondary | ICD-10-CM | POA: Insufficient documentation

## 2016-10-19 DIAGNOSIS — Z7902 Long term (current) use of antithrombotics/antiplatelets: Secondary | ICD-10-CM | POA: Diagnosis not present

## 2016-10-19 DIAGNOSIS — I2510 Atherosclerotic heart disease of native coronary artery without angina pectoris: Secondary | ICD-10-CM | POA: Insufficient documentation

## 2016-10-19 DIAGNOSIS — Z170 Estrogen receptor positive status [ER+]: Secondary | ICD-10-CM | POA: Diagnosis not present

## 2016-10-19 DIAGNOSIS — D0512 Intraductal carcinoma in situ of left breast: Secondary | ICD-10-CM | POA: Diagnosis not present

## 2016-10-19 DIAGNOSIS — E119 Type 2 diabetes mellitus without complications: Secondary | ICD-10-CM | POA: Insufficient documentation

## 2016-10-19 NOTE — Progress Notes (Signed)
Radiation Oncology         [phone] ________________________________  Initial outpatient Consultation  Name: Susan Barnett MRN: [id number]  Date: 10/19/2016  DOB: [birth year]  JE:HUDJSH, [name], MD  [name], MD   REFERRING PHYSICIAN: [name], MD  DIAGNOSIS:    ICD-9-CM ICD-10-CM   1. Malignant neoplasm of upper-outer quadrant of left breast in female, estrogen receptor positive ([place]) 174.4 C50.412    V86.0 Z17.0    Stage I [id number] Left Breast UOQ Invasive Ductal Carcinoma with DCIS, ER 100%/ PR 100% / Her2-, Grade 1  CHIEF COMPLAINT: Here to discuss management of left breast cancer  HISTORY OF PRESENT ILLNESS::Susan Barnett is a 71 y.o. female who presented with an abnormality in the left breast during a screening mammogram. She reports usually having a mammogram every year. However, she underwent surgery for coronary artery disease in 2015 and cancelled her yearly mammogram without rescheduling. This most recent mammogram was the first mammogram in 2.5 years. The abnormality measured 1.9 cm and a hypoechogenic area measured 1 cm in size.  She underwent a biopsy on 10/01/16 revealing invasive ductal carcinoma with DCIS in the 12:30 region involving a papillary region. Receptor status was ER (100%) PR (100%) Ki67 (10%) and Her2 (-). Left axilla was negative on ultrasound. Patient was seen by Dr. Lindi Adie on 10/15/16. He recommended breast conserving surgery with Dr. [name], followed by onoctype dx testing to determine the benefit of chemotherapy, followed by radiation therapy, and lastly adjuvant antiestrogen therapy.     Malignant neoplasm of upper-outer quadrant of left breast in female, estrogen receptor positive ([place])   10/01/2016 Initial Diagnosis    Left breast biopsy: 12:30: IDC with DCIS involving underlying papillary lesion, grade 1, ER 100%, PR 90%, Ki-67 10%, HER-2 negative ratio 1.29; irregular 1.9 cm mass axilla negative, T1 CN 0 stage IA clinical  stage      Patient denies lymphedema or pain at this time. She notes the area above and below her left nipple is itchy. She mentions this itch has occurred before, for which her GYN recommended hydrocortisone which provided relief. She notes a history of Type II diabetes. Patient notes bydureon shot causes nausea. She also notes general pain due to arthritis.   PREVIOUS RADIATION THERAPY: No; brachytherapy for coronary stents (2015)  PAST MEDICAL HISTORY:  has a past medical history of Anemia; Anginal pain ([place]); Anxiety; Arthritis; CAD (coronary artery disease); Complication of anesthesia; Diabetes mellitus, type 2 ([place]); Dysrhythmia; Gastric polyp; GERD (gastroesophageal reflux disease); Headache; Heartburn; Hiatal hernia; Hypertension; Hypothyroidism; Occasional tremors; Pneumonia; PONV (postoperative nausea and vomiting) (02/24/14); Shortness of breath; and Sleep apnea.    PAST SURGICAL HISTORY: Past Surgical History:  Procedure Laterality Date  . BREAST BIOPSY Left 1987  . CARDIAC CATHETERIZATION  06/02/2014   LAD 30%, D1 80%, CFX 755, RCA > 95% ISR, rx w/ PTCA, heavy calcification, EF 65%  . CORONARY ARTERY BYPASS GRAFT N/A 06/14/2014   Procedure: CORONARY ARTERY BYPASS GRAFTING (CABG);  Surgeon: Gaye Pollack, MD;  Location: [facility];  Service: Open Heart Surgery;  Laterality: N/A;  Times 2 using endoscopically harvested right saphenous vein.  . coronary stents     x3 stents placed-prior ablation  . DILATION AND CURETTAGE OF UTERUS    . ELBOW FRACTURE SURGERY Left   . INCONTINENCE SURGERY     sling  . KNEE ARTHROSCOPY Right 02/24/2014   Procedure: RIGHT KNEE ARTHROSCOPY WITH DEBRIDEMENT ;  Surgeon: Gearlean Alf, MD;  Location:  [facility];  Service: Orthopedics;  Laterality: Right;  . LEFT HEART CATHETERIZATION WITH CORONARY ANGIOGRAM N/A 06/02/2014   Procedure: LEFT HEART CATHETERIZATION WITH CORONARY ANGIOGRAM;  Surgeon: Sinclair Grooms, MD;  Location: [facility] CATH LAB;  Service:  Cardiovascular;  Laterality: N/A;  . TEE WITHOUT CARDIOVERSION N/A 06/14/2014   Procedure: TRANSESOPHAGEAL ECHOCARDIOGRAM (TEE);  Surgeon: Gaye Pollack, MD;  Location: [facility];  Service: Open Heart Surgery;  Laterality: N/A;    FAMILY HISTORY: family history includes Breast cancer (age of onset: 4) in her cousin; Diabetes in her mother; Healthy in her brother; Heart attack in her mother; Hypertension in her mother; Lung cancer in her brother.  SOCIAL HISTORY:  reports that she has never smoked. She has never used smokeless tobacco. She reports that she does not drink alcohol or use drugs.  ALLERGIES: Asa [aspirin]; Compazine [prochlorperazine edisylate]; Nsaids; Shellfish allergy; Sulfa antibiotics; Zantac [ranitidine hcl]; and Lisinopril  MEDICATIONS:  Current Outpatient Prescriptions  Medication Sig Dispense Refill  . amLODipine (NORVASC) 5 MG tablet Take 5 mg by mouth every morning.     Marland Kitchen atorvastatin (LIPITOR) 10 MG tablet Take 10 mg by mouth daily.    . cetirizine (ZYRTEC) 10 MG tablet Take 10 mg by mouth daily.    . cloNIDine (CATAPRES) 0.1 MG tablet Take 0.1 mg by mouth daily.    . clopidogrel (PLAVIX) 75 MG tablet Take 75 mg by mouth daily.    . Coenzyme Q10 (CO Q 10) 100 MG CAPS Take 300 mg by mouth 2 (two) times daily.    . Exenatide ER (BYDUREON) 2 MG SUSR Inject 2 mg into the skin once a week. Sundays    . levothyroxine (SYNTHROID, LEVOTHROID) 75 MCG tablet Take 75 mcg by mouth every morning.    Marland Kitchen losartan-hydrochlorothiazide (HYZAAR) 100-12.5 MG per tablet Take 1 tablet by mouth daily. 90 tablet 3  . metoprolol (LOPRESSOR) 50 MG tablet Take 1 tablet (50 mg total) by mouth 2 (two) times daily. 180 tablet 3  . Multiple Vitamins-Minerals (MULTIVITAMIN PO) Take 1 tablet by mouth daily.    . pantoprazole (PROTONIX) 40 MG tablet Take 1 tablet (40 mg total) by mouth daily before breakfast. 90 tablet 3  . potassium chloride SA (K-DUR,KLOR-CON) 20 MEQ tablet Take 20 mEq by mouth daily.   2  . sitaGLIPtin-metformin (JANUMET) 50-1000 MG per tablet Take 1 tablet by mouth 2 (two) times daily. Hold for 48 hours, restart on 06/05/2014    . diphenhydramine-acetaminophen (TYLENOL PM) 25-500 MG TABS Take 1 tablet by mouth at bedtime as needed (for sleep or pain).     Marland Kitchen NITROSTAT 0.4 MG SL tablet Take 0.4 mg by mouth as needed. TAKE AS DIRECTED FOR CHEST PAIN  0   No current facility-administered medications for this encounter.     REVIEW OF SYSTEMS: A 10+ POINT REVIEW OF SYSTEMS WAS OBTAINED including neurology, dermatology, psychiatry, cardiac, respiratory, lymph, extremities, GI, GU, Musculoskeletal, constitutional, breasts, reproductive, HEENT.  All pertinent positives are noted in the HPI.  All others are negative.   PHYSICAL EXAM:  height is 5' 2"  (1.575 m) and weight is 152 lb 6.4 oz (69.1 kg). Her temperature is 97.8 F (36.6 C). Her blood pressure is 134/64 and her pulse is 95. Her oxygen saturation is 100%.   General: Alert and oriented, in no acute distress HEENT: Head is normocephalic. Extraocular movements are intact. Oropharynx and oral cavity are clear.  Neck: Neck is supple, no palpable cervical or supraclavicular lymphadenopathy.  Heart: Regular in rate and rhythm with no murmurs, rubs, or gallops. Chest: Clear to auscultation bilaterally, with no rhonchi, wheezes, or rales.        Abdomen: Soft, nontender, nondistended, with no rigidity or guarding. Extremities: No cyanosis or edema. Lymphatics: see Neck Exam Skin:  See breast exam Musculoskeletal: symmetric strength and muscle tone throughout. Good range of motion in arms. Neurologic: Cranial nerves II through XII are grossly intact. No obvious focalities. Speech is fluent. Coordination is intact. Psychiatric: Judgment and insight are intact. Affect is appropriate. Left Breast: Mass noted at the 12:30 position with indistinct margins, estimate to palpation about 2.5 cm in greatest dimension. Macular spot noted at the  7:30 position of the left breast just outside of the areola. This area is slightly erythematous and 0.5 cm in size. The rim of the lesion is slightly scaly . Axilla is clear. Right Breast:No palpable mass appreciated in the breasts or axillae.   ECOG = 1  0 - Asymptomatic (Fully active, able to carry on all predisease activities without restriction)  1 - Symptomatic but completely ambulatory (Restricted in physically strenuous activity but ambulatory and able to carry out work of a light or sedentary nature. For example, light housework, office work)  2 - Symptomatic, <50% in bed during the day (Ambulatory and capable of all self care but unable to carry out any work activities. Up and about more than 50% of waking hours)  3 - Symptomatic, >50% in bed, but not bedbound (Capable of only limited self-care, confined to bed or chair 50% or more of waking hours)  4 - Bedbound (Completely disabled. Cannot carry on any self-care. Totally confined to bed or chair)  5 - Death   Eustace Pen MM, Creech RH, Tormey DC, et al. [phone]). "Toxicity and response criteria of the [facility]". [place] Oncol. 5 (6): 649-55   LABORATORY DATA:  Lab Results  Component Value Date   WBC 14.3 (H) 06/16/2014   HGB 9.6 (L) 06/16/2014   HCT 29.5 (L) 06/16/2014   MCV 81.5 06/16/2014   PLT 209 06/16/2014   CMP     Component Value Date/Time   NA 133 (L) 06/17/2014 0401   K 3.7 06/17/2014 0401   CL 95 (L) 06/17/2014 0401   CO2 23 06/17/2014 0401   GLUCOSE 132 (H) 06/17/2014 0401   BUN 8 06/17/2014 0401   CREATININE 0.43 (L) 06/17/2014 0401   CALCIUM 8.4 06/17/2014 0401   PROT 7.5 06/11/2014 1536   ALBUMIN 4.0 06/11/2014 1536   AST 18 06/11/2014 1536   ALT 15 06/11/2014 1536   ALKPHOS 65 06/11/2014 1536   BILITOT 0.2 (L) 06/11/2014 1536   GFRNONAA >90 06/17/2014 0401   GFRAA >90 06/17/2014 0401         RADIOGRAPHY: US Breast Ltd Uni Left Inc Axilla  Result Date:  09/27/2016 CLINICAL DATA:  Patient returns today to evaluate a possible left breast mass with associated calcifications identified on recent screening mammogram. EXAM: 2D DIGITAL DIAGNOSTIC LEFT MAMMOGRAM WITH CAD AND ADJUNCT TOMO ULTRASOUND LEFT BREAST COMPARISON:  Previous exams including recent screening mammogram dated 09/20/2016. ACR Breast Density Category b: There are scattered areas of fibroglandular density. FINDINGS: Additional views of the left breast were obtained today with 3D tomosynthesis, and with spot compression and magnification views. An irregular mass with associated coarse heterogeneous calcifications is confirmed within the upper left breast, at middle depth, 12-1 o'clock axis region, measuring approximately 1 cm greatest dimension. Mammographic  images were processed with CAD. Targeted ultrasound is performed, showing an oval mixed echogenicity mass (versus 2 abutting masses) at the 12:30 o'clock axis, 4 cm from the nipple, slightly irregular, corresponding to the mammographic finding, measuring 1.9 x 0.6 x 0.7 cm, the hyperechoic component a likely correlate for the associated calcifications seen on mammogram, the hyperechoic component measuring 1 x 0.6 x 0.7 cm. Left axilla was evaluated with ultrasound showing no enlarged or morphologically abnormal lymph nodes. IMPRESSION: Mixed echogenicity mass within the left breast at the 12:30 o'clock axis, 4 cm from the nipple, overall measuring 1.9 x 0.6 x 0.7 cm, corresponding to the mass with associated calcifications seen on mammogram. This is a suspicious finding for which ultrasound-guided biopsy is recommended. RECOMMENDATION: 1. Ultrasound-guided biopsy of the left breast mass at the 12:30 o'clock axis, 4 cm from the nipple, measuring 1.9 x 0.6 x 0.7 cm. 2. Postprocedure mammogram to ensure correspondence of the mammographic and sonographic findings. Ultrasound-guided biopsy is scheduled for 10/01/2016. I have discussed the findings and  recommendations with the patient. Results were also provided in writing at the conclusion of the visit. If applicable, a reminder letter will be sent to the patient regarding the next appointment. BI-RADS CATEGORY  4: Suspicious. Electronically Signed   By: Franki Cabot M.D.   On: 09/27/2016 13:07   Mm Diag Breast Tomo Uni Left  Result Date: 09/27/2016 CLINICAL DATA:  Patient returns today to evaluate a possible left breast mass with associated calcifications identified on recent screening mammogram. EXAM: 2D DIGITAL DIAGNOSTIC LEFT MAMMOGRAM WITH CAD AND ADJUNCT TOMO ULTRASOUND LEFT BREAST COMPARISON:  Previous exams including recent screening mammogram dated 09/20/2016. ACR Breast Density Category b: There are scattered areas of fibroglandular density. FINDINGS: Additional views of the left breast were obtained today with 3D tomosynthesis, and with spot compression and magnification views. An irregular mass with associated coarse heterogeneous calcifications is confirmed within the upper left breast, at middle depth, 12-1 o'clock axis region, measuring approximately 1 cm greatest dimension. Mammographic images were processed with CAD. Targeted ultrasound is performed, showing an oval mixed echogenicity mass (versus 2 abutting masses) at the 12:30 o'clock axis, 4 cm from the nipple, slightly irregular, corresponding to the mammographic finding, measuring 1.9 x 0.6 x 0.7 cm, the hyperechoic component a likely correlate for the associated calcifications seen on mammogram, the hyperechoic component measuring 1 x 0.6 x 0.7 cm. Left axilla was evaluated with ultrasound showing no enlarged or morphologically abnormal lymph nodes. IMPRESSION: Mixed echogenicity mass within the left breast at the 12:30 o'clock axis, 4 cm from the nipple, overall measuring 1.9 x 0.6 x 0.7 cm, corresponding to the mass with associated calcifications seen on mammogram. This is a suspicious finding for which ultrasound-guided biopsy is  recommended. RECOMMENDATION: 1. Ultrasound-guided biopsy of the left breast mass at the 12:30 o'clock axis, 4 cm from the nipple, measuring 1.9 x 0.6 x 0.7 cm. 2. Postprocedure mammogram to ensure correspondence of the mammographic and sonographic findings. Ultrasound-guided biopsy is scheduled for 10/01/2016. I have discussed the findings and recommendations with the patient. Results were also provided in writing at the conclusion of the visit. If applicable, a reminder letter will be sent to the patient regarding the next appointment. BI-RADS CATEGORY  4: Suspicious. Electronically Signed   By: Franki Cabot M.D.   On: 09/27/2016 13:07   Mm Screening Breast Tomo Bilateral  Result Date: 09/20/2016 CLINICAL DATA:  Screening. EXAM: 2D DIGITAL SCREENING BILATERAL MAMMOGRAM WITH CAD AND ADJUNCT TOMO  COMPARISON:  Previous exam(s). ACR Breast Density Category b: There are scattered areas of fibroglandular density. FINDINGS: In the left breast, a mass with associated calcifications s warrant further evaluation. In the right breast, no findings suspicious for malignancy. Images were processed with CAD. IMPRESSION: Further evaluation is suggested for mass with associated calcifications calcifications in the left breast. RECOMMENDATION: Diagnostic mammogram and possibly ultrasound of the left breast. (Code:[id number]) The patient will be contacted regarding the findings, and additional imaging will be scheduled. BI-RADS CATEGORY  0: Incomplete. Need additional imaging evaluation and/or prior mammograms for comparison. Electronically Signed   By: Everlean Alstrom M.D.   On: 09/20/2016 12:24   Mm Clip Placement Left  Result Date: 10/01/2016 CLINICAL DATA:  Left 12:30 o'clock breast mass. EXAM: DIAGNOSTIC LEFT MAMMOGRAM POST ULTRASOUND BIOPSY COMPARISON:  Previous exam(s). FINDINGS: Mammographic images were obtained following ultrasound guided biopsy of left breast mass. Two-view mammography demonstrates presence of ribbon  shaped marker within the biopsied mass. The marker does correspond to the area of calcifications noted mammographically. IMPRESSION: Successful placement of ribbon shaped marker within the left breast, post ultrasound-guided core needle biopsy. Final Assessment: Post Procedure Mammograms for Marker Placement Electronically Signed   By: Fidela Salisbury M.D.   On: 10/01/2016 08:25   [place] Lt Breast Bx W Loc Dev 1st Lesion Img Bx Spec US Guide  Addendum Date: 10/02/2016   ADDENDUM REPORT: 10/02/2016 14:43 ADDENDUM: Pathology revealed GRADE I INVASIVE DUCTAL CARCINOMA, DUCTAL CARCINOMA IN SITU WITH CALCIFICATIONS of the Left breast, 12:30 o'clock. The carcinoma appears to be involving an underlying papillary lesion. This was found to be concordant by Dr. Fidela Salisbury. Pathology results were discussed with the patient by telephone. The patient reported doing well after the biopsy with tenderness at the site. Post biopsy instructions and care were reviewed and questions were answered. The patient was encouraged to call [facility] for any additional concerns. Surgical consultation has been arranged with Dr. Adonis Housekeeper at [facility] on October 04, 2016. Pathology results reported by Terie Purser, RN on 10/02/2016. Electronically Signed   By: Fidela Salisbury M.D.   On: 10/02/2016 14:43   Result Date: 10/02/2016 CLINICAL DATA:  Left breast 12:30 o'clock mass. EXAM: ULTRASOUND GUIDED LEFT BREAST CORE NEEDLE BIOPSY COMPARISON:  Previous exam(s). FINDINGS: I met with the patient and we discussed the procedure of ultrasound-guided biopsy, including benefits and alternatives. We discussed the high likelihood of a successful procedure. We discussed the risks of the procedure, including infection, bleeding, tissue injury, clip migration, and inadequate sampling. Informed written consent was given. The usual time-out protocol was performed immediately prior to the  procedure. Using sterile technique and 1% Lidocaine as local anesthetic, under direct ultrasound visualization, a 14 gauge spring-loaded device was used to perform biopsy of left breast mass using a lateral approach. At the conclusion of the procedure a ribbon shaped tissue marker clip was deployed into the biopsy cavity. Follow up 2 view mammogram was performed and dictated separately. IMPRESSION: Ultrasound guided biopsy of left breast.  No apparent complications. Electronically Signed: By: Fidela Salisbury M.D. On: 10/01/2016 08:15      IMPRESSION/PLAN: 71 year old woman with stage I  Left Breast UOQ Invasive Ductal Carcinoma with DCIS, ER 100%/ Pr 100% / Her2-, Grade 1  Patient will be scheduled for breast conserving surgery, followed by oncotype testing, then radiation therapy, and lastly adjuvant antiestrogen therapy. Anticipate radiation to begin 4-8 weeks post op and 16-20 treatments (depending  on final path and whether she gets chemotherapy). I recommended the patient follow up with a dermatologist for a skin exam within the next year.  It was a pleasure meeting the patient today. We discussed the risks, benefits, and side effects of radiotherapy. I recommend radiotherapy to the left breast to reduce her risk of locoregional recurrence by 2/3.  We discussed that radiation would take approximately 3.5-4 weeks (or 5-6 weeks if oncotype testing reveals chemotherapy would be beneficial) to complete and that I would give the patient a few weeks to heal following surgery before starting treatment planning. If chemotherapy were to be given, this would precede radiotherapy. We spoke about acute effects including skin irritation and fatigue as well as much less common late effects including internal organ injury or irritation. We spoke about the latest technology that is used to minimize the risk of late effects for patients undergoing radiotherapy to the breast or chest wall. No guarantees of treatment  were given. The patient is enthusiastic about proceeding with treatment. I look forward to participating in the patient's care.  I will await her referral back to me for postoperative follow-up and eventual CT simulation/treatment planning.  Of note, I discussed the data from the [employer] al trial in the [place] [employer]. She understands that tamoxifen compared to radiation plus tamoxifen demonstrated no survival benefit among the women in this study. The women were 39 years or older with stage I estrogen receptor positive breast cancer. Extrapolating from this study, I told Ms. Busick that her overall life expectancy should not be affected by adding radiotherapy to antiestrogen medication. She understands that the main benefit of radiotherapy would be a modest but measurable improvement in chances of local control.  Given her low-70s age, performance status, and tumor size, I think radiotherapy is a good option for her.  It was a pleasure meeting the patient today.  I look forward to participating in the patient's care.     __________________________________________   Eppie Gibson, MD   This document serves as a record of services personally performed by Eppie Gibson, MD. It was created on her behalf by Bethann Humble, a trained medical scribe. The creation of this record is based on the scribe's personal observations and the provider's statements to them. This document has been checked and approved by the attending provider.

## 2016-11-01 ENCOUNTER — Other Ambulatory Visit: Payer: Self-pay | Admitting: General Surgery

## 2016-11-01 DIAGNOSIS — C50912 Malignant neoplasm of unspecified site of left female breast: Secondary | ICD-10-CM

## 2016-11-05 ENCOUNTER — Telehealth: Payer: Self-pay | Admitting: Hematology and Oncology

## 2016-11-05 NOTE — Telephone Encounter (Signed)
lvm to inform pt of 4/24 appt at 1115 am per LOS

## 2016-11-13 ENCOUNTER — Emergency Department (HOSPITAL_COMMUNITY)
Admission: EM | Admit: 2016-11-13 | Discharge: 2016-11-13 | Disposition: A | Discharge status: Home / Self Care | Payer: Medicare Other | Attending: Emergency Medicine | Admitting: Emergency Medicine

## 2016-11-13 ENCOUNTER — Encounter (HOSPITAL_COMMUNITY): Payer: Self-pay | Admitting: Vascular Surgery

## 2016-11-13 ENCOUNTER — Encounter (HOSPITAL_COMMUNITY)
Admission: RE | Admit: 2016-11-13 | Discharge: 2016-11-13 | Disposition: A | Discharge status: Home / Self Care | Payer: Medicare Other | Source: Ambulatory Visit | Attending: General Surgery | Admitting: General Surgery

## 2016-11-13 ENCOUNTER — Encounter (HOSPITAL_COMMUNITY): Payer: Self-pay | Admitting: Emergency Medicine

## 2016-11-13 DIAGNOSIS — E039 Hypothyroidism, unspecified: Secondary | ICD-10-CM | POA: Diagnosis not present

## 2016-11-13 DIAGNOSIS — Z955 Presence of coronary angioplasty implant and graft: Secondary | ICD-10-CM | POA: Insufficient documentation

## 2016-11-13 DIAGNOSIS — R739 Hyperglycemia, unspecified: Secondary | ICD-10-CM

## 2016-11-13 DIAGNOSIS — I10 Essential (primary) hypertension: Secondary | ICD-10-CM | POA: Insufficient documentation

## 2016-11-13 DIAGNOSIS — E1165 Type 2 diabetes mellitus with hyperglycemia: Secondary | ICD-10-CM | POA: Insufficient documentation

## 2016-11-13 DIAGNOSIS — Z7984 Long term (current) use of oral hypoglycemic drugs: Secondary | ICD-10-CM | POA: Diagnosis not present

## 2016-11-13 DIAGNOSIS — C50912 Malignant neoplasm of unspecified site of left female breast: Secondary | ICD-10-CM | POA: Insufficient documentation

## 2016-11-13 DIAGNOSIS — Z01812 Encounter for preprocedural laboratory examination: Secondary | ICD-10-CM | POA: Insufficient documentation

## 2016-11-13 DIAGNOSIS — Z951 Presence of aortocoronary bypass graft: Secondary | ICD-10-CM | POA: Diagnosis not present

## 2016-11-13 DIAGNOSIS — I2510 Atherosclerotic heart disease of native coronary artery without angina pectoris: Secondary | ICD-10-CM | POA: Diagnosis not present

## 2016-11-13 LAB — BASIC METABOLIC PANEL
Anion gap: 12 (ref 5–15)
Anion gap: 12 (ref 5–15)
BUN: 15 mg/dL (ref 6–20)
BUN: 10 mg/dL (ref 6–20)
CO2: 25 mmol/L (ref 22–32)
CO2: 27 mmol/L (ref 22–32)
Calcium: 9.4 mg/dL (ref 8.9–10.3)
Calcium: 9.8 mg/dL (ref 8.9–10.3)
Chloride: 92 mmol/L — ABNORMAL LOW (ref 101–111)
Chloride: 95 mmol/L — ABNORMAL LOW (ref 101–111)
Creatinine, Ser: 0.82 mg/dL (ref 0.44–1.00)
Creatinine, Ser: 0.69 mg/dL (ref 0.44–1.00)
GFR calc Af Amer: >60 mL/min (ref >60)
GFR calc Af Amer: >60 mL/min (ref >60)
GFR calc non Af Amer: >60 mL/min (ref >60)
GFR calc non Af Amer: >60 mL/min (ref >60)
Glucose, Bld: 545 mg/dL (ref 65–99)
Glucose, Bld: 277 mg/dL — ABNORMAL HIGH (ref 65–99)
Potassium: 3.8 mmol/L (ref 3.5–5.1)
Potassium: 3.3 mmol/L — ABNORMAL LOW (ref 3.5–5.1)
Sodium: 129 mmol/L — ABNORMAL LOW (ref 135–145)
Sodium: 134 mmol/L — ABNORMAL LOW (ref 135–145)

## 2016-11-13 LAB — CBG MONITORING, ED
Glucose-Capillary: 481 mg/dL — ABNORMAL HIGH (ref 65–99)
Glucose-Capillary: 210 mg/dL — ABNORMAL HIGH (ref 65–99)

## 2016-11-13 LAB — CBC WITH DIFFERENTIAL/PLATELET
Basophils Absolute: 0 10*3/uL (ref 0.0–0.1)
Basophils Relative: 0 %
Eosinophils Absolute: 0.6 10*3/uL (ref 0.0–0.7)
Eosinophils Relative: 5 %
HCT: 36 % (ref 36.0–46.0)
Hemoglobin: 11 g/dL — ABNORMAL LOW (ref 12.0–15.0)
Lymphocytes Relative: 26 %
Lymphs Abs: 3.3 10*3/uL (ref 0.7–4.0)
MCH: 21.5 pg — ABNORMAL LOW (ref 26.0–34.0)
MCHC: 30.6 g/dL (ref 30.0–36.0)
MCV: 70.3 fL — ABNORMAL LOW (ref 78.0–100.0)
Monocytes Absolute: 0.9 10*3/uL (ref 0.1–1.0)
Monocytes Relative: 7 %
Neutro Abs: 7.9 10*3/uL — ABNORMAL HIGH (ref 1.7–7.7)
Neutrophils Relative %: 62 %
Platelets: 393 10*3/uL (ref 150–400)
RBC: 5.12 MIL/uL — ABNORMAL HIGH (ref 3.87–5.11)
RDW: 16.8 % — ABNORMAL HIGH (ref 11.5–15.5)
WBC: 12.7 10*3/uL — ABNORMAL HIGH (ref 4.0–10.5)

## 2016-11-13 LAB — CBC
HCT: 32.9 % — ABNORMAL LOW (ref 36.0–46.0)
Hemoglobin: 10.3 g/dL — ABNORMAL LOW (ref 12.0–15.0)
MCH: 22.2 pg — ABNORMAL LOW (ref 26.0–34.0)
MCHC: 31.3 g/dL (ref 30.0–36.0)
MCV: 70.9 fL — ABNORMAL LOW (ref 78.0–100.0)
Platelets: 370 10*3/uL (ref 150–400)
RBC: 4.64 MIL/uL (ref 3.87–5.11)
RDW: 17.5 % — ABNORMAL HIGH (ref 11.5–15.5)
WBC: 11.8 10*3/uL — ABNORMAL HIGH (ref 4.0–10.5)

## 2016-11-13 LAB — GLUCOSE, CAPILLARY: Glucose-Capillary: 559 mg/dL (ref 65–99)

## 2016-11-13 MED ORDER — CHLORHEXIDINE GLUCONATE CLOTH 2 % EX PADS: 6.0000 | MEDICATED_PAD | Freq: Once | CUTANEOUS | Status: DC

## 2016-11-13 MED ORDER — SODIUM CHLORIDE 0.9 % IV BOLUS (SEPSIS)
1000.0000 mL | Freq: Once | INTRAVENOUS | Status: AC
  Administered 2016-11-13: 1000 mL via INTRAVENOUS

## 2016-11-13 NOTE — ED Provider Notes (Signed)
[facility] DEPT Provider Note   CSN: [id number] Arrival date & time: 11/13/16  1359     History   Chief Complaint Chief Complaint  Patient presents with  . Hyperglycemia    HPI Susan Barnett is a 71 y.o. female.  HPI   Patient presents from pre-op after she was found to have hyperglycemia. Being evaluated for Breast Ca surgery. Glucose found to be 550 and sent to ED.  Reports she had tater tots with her grandchildren prior to presentation.Reports she normally does not eat this way. Denies any other associated symptoms including no abdominal pain, nausea, vomiting or other concerns.  Reports her HgA1C has been increasing.  Past Medical History:  Diagnosis Date  . Anemia    mild  . Anginal pain ([place])    with exertion, last time prior to cardiac caht 06/02/14  . Anxiety    situational  . Arthritis   . CAD (coronary artery disease)    Dr. Linard Millers follows, no problems now  . Complication of anesthesia   . Diabetes mellitus, type 2 ([place])    TYPE 2  . Dysrhythmia    occ skips a beat, rate was fast before beta blocker.  . Gastric polyp   . GERD (gastroesophageal reflux disease)   . Headache    hx of  . Heartburn   . Hiatal hernia    mild head tremors  . Hypertension   . Hypothyroidism   . Occasional tremors    "of head", slight hands  . Pneumonia   . PONV (postoperative nausea and vomiting) 02/24/14  . Shortness of breath    with exertion  . Sleep apnea    cpap used nightly x 2 yrs now.    Patient Active Problem List   Diagnosis Date Noted  . Malignant neoplasm of upper-outer quadrant of left breast in female, estrogen receptor positive ([place]) 10/15/2016  . Hyperlipidemia 07/14/2015  . S/P CABG x 2 06/14/2014  . Angina pectoris, crescendo ([place]) 06/02/2014  . Lateral meniscal tear 02/23/2014  . GASTRIC POLYP 11/19/2007  . HYPOTHYROIDISM 11/19/2007  . Type 2 diabetes mellitus with complications ([place]) [birth year]1937  . Essential hypertension 11/19/2007  .  Coronary atherosclerosis 11/19/2007  . GERD 11/19/2007  . HIATAL HERNIA 11/19/2007  . RECTAL BLEEDING 11/19/2007  . HEARTBURN 11/19/2007    Past Surgical History:  Procedure Laterality Date  . BREAST BIOPSY Left 1987  . CARDIAC CATHETERIZATION  06/02/2014   LAD 30%, D1 80%, CFX 755, RCA > 95% ISR, rx w/ PTCA, heavy calcification, EF 65%  . CORONARY ARTERY BYPASS GRAFT N/A 06/14/2014   Procedure: CORONARY ARTERY BYPASS GRAFTING (CABG);  Surgeon: Gaye Pollack, MD;  Location: [facility];  Service: Open Heart Surgery;  Laterality: N/A;  Times 2 using endoscopically harvested right saphenous vein.  . coronary stents     x3 stents placed-prior ablation  . DILATION AND CURETTAGE OF UTERUS    . ELBOW FRACTURE SURGERY Left   . INCONTINENCE SURGERY     sling  . KNEE ARTHROSCOPY Right 02/24/2014   Procedure: RIGHT KNEE ARTHROSCOPY WITH DEBRIDEMENT ;  Surgeon: Gearlean Alf, MD;  Location: [facility];  Service: Orthopedics;  Laterality: Right;  . LEFT HEART CATHETERIZATION WITH CORONARY ANGIOGRAM N/A 06/02/2014   Procedure: LEFT HEART CATHETERIZATION WITH CORONARY ANGIOGRAM;  Surgeon: Sinclair Grooms, MD;  Location[facility];  Service: Cardiovascular;  Laterality: N/A;  . TEE WITHOUT CARDIOVERSION N/A 06/14/2014   Procedure: TRANSESOPHAGEAL ECHOCARDIOGRAM (TEE);  Surgeon: Gaye Pollack, MD;  Location: [facility];  Service: Open Heart Surgery;  Laterality: N/A;    OB History    No data available       Home Medications    Prior to Admission medications   Medication Sig Start Date End Date Taking? Authorizing Provider  acetaminophen (TYLENOL) 500 MG tablet Take 500 mg by mouth every 6 (six) hours as needed (for pain/headaches.).    Historical Provider, MD  amLODipine (NORVASC) 5 MG tablet Take 5 mg by mouth daily.     Historical Provider, MD  atorvastatin (LIPITOR) 10 MG tablet Take 10 mg by mouth every evening.     Historical Provider, MD  BYDUREON 2 MG PEN Inject 2 mg into the skin every Sunday.  10/18/16   Historical Provider, MD  cetirizine (ZYRTEC) 10 MG tablet Take 10 mg by mouth daily.    Historical Provider, MD  cloNIDine (CATAPRES) 0.1 MG tablet Take 0.1 mg by mouth every evening.     Historical Provider, MD  clopidogrel (PLAVIX) 75 MG tablet Take 75 mg by mouth daily.    Historical Provider, MD  Coenzyme Q10 300 MG CAPS Take 300 mg by mouth 2 (two) times daily.    Historical Provider, MD  Hydrocortisone (AVWPVXYIA-16 EX) Apply 1 application topically 2 (two) times daily as needed (for dry/itchy affected area(s) of skin).    Historical Provider, MD  levothyroxine (SYNTHROID, LEVOTHROID) 75 MCG tablet Take 75 mcg by mouth every morning.    Historical Provider, MD  losartan-hydrochlorothiazide (HYZAAR) 100-12.5 MG per tablet Take 1 tablet by mouth daily. 09/08/14   [name], MD  metoprolol (LOPRESSOR) 50 MG tablet Take 1 tablet (50 mg total) by mouth 2 (two) times daily. 09/08/14   [name], MD  Multiple Vitamin (MULTIVITAMIN WITH MINERALS) TABS tablet Take 1 tablet by mouth at bedtime.    Historical Provider, MD  NITROSTAT 0.4 MG SL tablet Take 0.4 mg by mouth every 5 (five) minutes as needed for chest pain.  12/08/14   Historical Provider, MD  pantoprazole (PROTONIX) 40 MG tablet Take 1 tablet (40 mg total) by mouth daily before breakfast. 09/08/14   [name], MD  Polyethyl Glycol-Propyl Glycol (SYSTANE) 0.4-0.3 % SOLN Place 1 drop into both eyes 3 (three) times daily as needed (for dry/irritated eyes).    Historical Provider, MD  potassium chloride SA (K-DUR,KLOR-CON) 20 MEQ tablet Take 20 mEq by mouth daily. 12/02/14   Historical Provider, MD  sitaGLIPtin-metformin (JANUMET) 50-1000 MG per tablet Take 1 tablet by mouth 2 (two) times daily. Hold for 48 hours, restart on 06/05/2014 06/03/14   Evelene Croon Barrett, PA-C    Family History Family History  Problem Relation Age of Onset  . Heart attack Mother   . Hypertension Mother   . Diabetes Mother   . Lung cancer Brother     . Healthy Brother   . Breast cancer Cousin 60    Social History Social History  Substance Use Topics  . Smoking status: Never Smoker  . Smokeless tobacco: Never Used  . Alcohol use No     Allergies   Asa [aspirin]; Compazine [prochlorperazine edisylate]; Nsaids; Shellfish allergy; Sulfa antibiotics; Zantac [ranitidine hcl]; and Lisinopril   Review of Systems Review of Systems  Constitutional: Negative for fever.  HENT: Negative for sore throat.   Eyes: Negative for visual disturbance.  Respiratory: Negative for cough and shortness of breath.   Cardiovascular: Negative for chest pain.  Gastrointestinal: Negative  for abdominal pain.  Genitourinary: Negative for difficulty urinating.  Musculoskeletal: Negative for back pain and neck pain.  Skin: Negative for rash.  Neurological: Negative for syncope and headaches.     Physical Exam Updated Vital Signs BP (!) 153/65 (BP Location: Left Arm)   Pulse 72   Resp 18   SpO2 99%   Physical Exam  Constitutional: She is oriented to person, place, and time. She appears well-developed and well-nourished. No distress.  HENT:  Head: Normocephalic and atraumatic.  Eyes: Conjunctivae and EOM are normal.  Neck: Normal range of motion.  Cardiovascular: Normal rate, regular rhythm, normal heart sounds and intact distal pulses.  Exam reveals no gallop and no friction rub.   No murmur heard. Pulmonary/Chest: Effort normal and breath sounds normal. No respiratory distress. She has no wheezes. She has no rales.  Abdominal: Soft. She exhibits no distension. There is no tenderness. There is no guarding.  Musculoskeletal: She exhibits no edema or tenderness.  Neurological: She is alert and oriented to person, place, and time.  Skin: Skin is warm and dry. No rash noted. She is not diaphoretic. No erythema.  Nursing note and vitals reviewed.    ED Treatments / Results  Labs (all labs ordered are listed, but only abnormal results are  displayed) Labs Reviewed  CBC WITH DIFFERENTIAL/PLATELET - Abnormal; Notable for the following:       Result Value   WBC 12.7 (*)    RBC 5.12 (*)    Hemoglobin 11.0 (*)    MCV 70.3 (*)    MCH 21.5 (*)    RDW 16.8 (*)    Neutro Abs 7.9 (*)    All other components within normal limits  BASIC METABOLIC PANEL - Abnormal; Notable for the following:    Sodium 134 (*)    Potassium 3.3 (*)    Chloride 95 (*)    Glucose, Bld 277 (*)    All other components within normal limits  CBG MONITORING, ED - Abnormal; Notable for the following:    Glucose-Capillary 481 (*)    All other components within normal limits  CBG MONITORING, ED - Abnormal; Notable for the following:    Glucose-Capillary 210 (*)    All other components within normal limits    EKG  EKG Interpretation None       Radiology No results found.  Procedures Procedures (including critical care time)  Medications Ordered in ED Medications  sodium chloride 0.9 % bolus 1,000 mL (0 mLs Intravenous Stopped 11/13/16 1946)     Initial Impression / Assessment and Plan / ED Course  I have reviewed the triage vital signs and the nursing notes.  Pertinent labs & imaging results that were available during my care of the patient were reviewed by me and considered in my medical decision making (see chart for details).    71yo female with history of DM, htn, CAD, CABG, hyperlipidemia, breast cancer, presents from pre-op with concern for asymptomatic hyperglycemia.  Given IV fluids, labs show no sign of acidosis.  Glucose improved to 210. Recommend close follow up with PCP regarding glucose control. Patient discharged in stable condition with understanding of reasons to return.   Final Clinical Impressions(s) / ED Diagnoses   Final diagnoses:  Hyperglycemia    New Prescriptions Discharge Medication List as of 11/13/2016  7:41 PM       Gareth Morgan, MD 11/14/16 1600

## 2016-11-13 NOTE — Pre-Procedure Instructions (Addendum)
SHANLEY FURLOUGH  11/13/2016      [facility] [address] [place], [place] [address] Phone: [phone] Fax: [phone]    Your procedure is scheduled on Tues . April 10th  Report to [facility] Admitting at 1100 A.M.  Call this number if you have problems the morning of surgery:  [phone]   Remember:  Do not eat food or drink liquids after midnight.  Take these medicines the morning of surgery with A SIP OF WATER amlodipine (norvasc), metoprolol (lopressor), cetirizine (zyrtec), levothyroxine (synthroid),  pantoprazole (protonix),   Stop Plavix as instructed by your surgeon. Advised to stop 11/15/16 by surgeon, patient is aware  Stop taking any Vitamins or Herbal Medications. No Aspirin Goody's, BC's, Aleve, Advil, Motrin, Ibuprofen, naproxen or Fish oil.   Do not wear jewelry, make-up or nail polish.  Do not wear lotions, powders, or perfumes, or deoderant.  Do not shave 48 hours prior to surgery.   Do not bring valuables to the hospital.  [facility] is not responsible for any belongings or valuables.  Contacts, dentures or bridgework may not be worn into surgery.  Leave your suitcase in the car.  After surgery it may be brought to your room.  For patients admitted to the hospital, discharge time will be determined by your treatment team.  Patients discharged the day of surgery will not be allowed to drive home.    Special instructions:    How to Manage Your Diabetes Before and After Surgery  Why is it important to control my blood sugar before and after surgery? . Improving blood sugar levels before and after surgery helps healing and can limit problems. . A way of improving blood sugar control is eating a healthy diet by: o  Eating less sugar and carbohydrates o  Increasing activity/exercise o  Talking with your doctor about reaching your blood sugar goals . High blood sugars  (greater than 180 mg/dL) can raise your risk of infections and slow your recovery, so you will need to focus on controlling your diabetes during the weeks before surgery. . Make sure that the doctor who takes care of your diabetes knows about your planned surgery including the date and location.  How do I manage my blood sugar before surgery? . Check your blood sugar at least 4 times a day, starting 2 days before surgery, to make sure that the level is not too high or low. o Check your blood sugar the morning of your surgery when you wake up and every 2 hours until you get to the Short Stay unit. . If your blood sugar is less than 70 mg/dL, you will need to treat for low blood sugar: o Do not take insulin. o Treat a low blood sugar (less than 70 mg/dL) with  cup of clear juice (cranberry or apple), 4 glucose tablets, OR glucose gel. o Recheck blood sugar in 15 minutes after treatment (to make sure it is greater than 70 mg/dL). If your blood sugar is not greater than 70 mg/dL on recheck, call [phone] for further instructions. . Report your blood sugar to the short stay nurse when you get to Short Stay.  . If you are admitted to the hospital after surgery: o Your blood sugar will be checked by the staff and you will probably be given insulin after surgery (instead of oral diabetes medicines) to make sure you have good blood sugar  levels. o The goal for blood sugar control after surgery is 80-180 mg/dL.      WHAT DO I DO ABOUT MY DIABETES MEDICATION?   Marland Kitchen Do not take oral diabetes medicines (pills) the morning of surgery.    Reviewed and Endorsed by [facility] Patient Education Committee, August 2015  Please read over the following fact sheets that you were given. Pain Booklet, Coughing and Deep Breathing and Surgical Site Infection Prevention

## 2016-11-13 NOTE — ED Triage Notes (Signed)
Pt here for pre op for breast CA sx and they noted CBG was 550 and sent her here for eval

## 2016-11-13 NOTE — Progress Notes (Addendum)
PCP: Dr. Shirline Frees  Cardiologist: Dr. Daneen Schick  YJE:12/6312 in [place] test: reports many years ago  ECHO:06/2014 in [facility]  Cardiac Cath: 05/2014 in [facility]  Chest x-ray: 07/2014 in [facility], denies past year  Patient's blood sugar critically elevated at 552, consults with Willeen Cass NP and transported patient to ED for evaluation.  She was asymptomatic.

## 2016-11-13 NOTE — ED Notes (Signed)
Pt has zero complaints. Pt is to have lumpectomy next week.

## 2016-11-14 DIAGNOSIS — E1165 Type 2 diabetes mellitus with hyperglycemia: Secondary | ICD-10-CM | POA: Diagnosis not present

## 2016-11-14 DIAGNOSIS — Z794 Long term (current) use of insulin: Secondary | ICD-10-CM | POA: Diagnosis not present

## 2016-11-14 LAB — HEMOGLOBIN A1C
Hgb A1c MFr Bld: 14 % — ABNORMAL HIGH (ref 4.8–5.6)
Mean Plasma Glucose: 355 mg/dL

## 2016-11-19 ENCOUNTER — Telehealth: Payer: Self-pay | Admitting: *Deleted

## 2016-11-19 DIAGNOSIS — Z170 Estrogen receptor positive status [ER+]: Principal | ICD-10-CM

## 2016-11-19 DIAGNOSIS — C50412 Malignant neoplasm of upper-outer quadrant of left female breast: Secondary | ICD-10-CM

## 2016-11-19 MED ORDER — ANASTROZOLE 1 MG PO TABS: 1.0000 mg | ORAL_TABLET | Freq: Every day | ORAL | 6 refills | Status: DC

## 2016-11-19 NOTE — Telephone Encounter (Signed)
Called pt and discussed anastrozole instructions while waiting on glucose to stabilize in order to have sx.

## 2016-11-20 ENCOUNTER — Other Ambulatory Visit (HOSPITAL_COMMUNITY): Payer: Medicare Other

## 2016-11-20 ENCOUNTER — Ambulatory Visit (HOSPITAL_COMMUNITY): Admission: RE | Admit: 2016-11-20 | Payer: Medicare Other | Source: Ambulatory Visit | Admitting: General Surgery

## 2016-11-20 ENCOUNTER — Encounter (HOSPITAL_COMMUNITY): Admission: RE | Payer: Self-pay | Source: Ambulatory Visit

## 2016-11-20 SURGERY — BREAST LUMPECTOMY WITH RADIOACTIVE SEED AND SENTINEL LYMPH NODE BIOPSY
Anesthesia: General | Site: Breast | Laterality: Left

## 2016-12-04 ENCOUNTER — Ambulatory Visit: Payer: Medicare Other | Admitting: Hematology and Oncology

## 2016-12-10 ENCOUNTER — Telehealth: Payer: Self-pay | Admitting: Hematology and Oncology

## 2016-12-10 NOTE — Telephone Encounter (Signed)
Confirmed 5/22 appt at 245 pm per LOS

## 2016-12-19 ENCOUNTER — Ambulatory Visit: Payer: Self-pay | Admitting: General Surgery

## 2016-12-19 ENCOUNTER — Encounter (HOSPITAL_COMMUNITY)
Admission: RE | Admit: 2016-12-19 | Discharge: 2016-12-19 | Disposition: A | Discharge status: Home / Self Care | Payer: Medicare Other | Source: Ambulatory Visit | Attending: General Surgery | Admitting: General Surgery

## 2016-12-19 DIAGNOSIS — Z01818 Encounter for other preprocedural examination: Secondary | ICD-10-CM | POA: Insufficient documentation

## 2016-12-19 DIAGNOSIS — C50912 Malignant neoplasm of unspecified site of left female breast: Secondary | ICD-10-CM | POA: Diagnosis not present

## 2016-12-19 LAB — BASIC METABOLIC PANEL
Anion gap: 8 (ref 5–15)
BUN: 9 mg/dL (ref 6–20)
CO2: 26 mmol/L (ref 22–32)
Calcium: 9.4 mg/dL (ref 8.9–10.3)
Chloride: 97 mmol/L — ABNORMAL LOW (ref 101–111)
Creatinine, Ser: 0.54 mg/dL (ref 0.44–1.00)
GFR calc Af Amer: >60 mL/min (ref >60)
GFR calc non Af Amer: >60 mL/min (ref >60)
Glucose, Bld: 120 mg/dL — ABNORMAL HIGH (ref 65–99)
Potassium: 3.7 mmol/L (ref 3.5–5.1)
Sodium: 131 mmol/L — ABNORMAL LOW (ref 135–145)

## 2016-12-19 LAB — CBC
HCT: 33.9 % — ABNORMAL LOW (ref 36.0–46.0)
Hemoglobin: 10.2 g/dL — ABNORMAL LOW (ref 12.0–15.0)
MCH: 21.4 pg — ABNORMAL LOW (ref 26.0–34.0)
MCHC: 30.1 g/dL (ref 30.0–36.0)
MCV: 71.1 fL — ABNORMAL LOW (ref 78.0–100.0)
Platelets: 416 10*3/uL — ABNORMAL HIGH (ref 150–400)
RBC: 4.77 MIL/uL (ref 3.87–5.11)
RDW: 17.2 % — ABNORMAL HIGH (ref 11.5–15.5)
WBC: 11.7 10*3/uL — ABNORMAL HIGH (ref 4.0–10.5)

## 2016-12-19 LAB — GLUCOSE, CAPILLARY: Glucose-Capillary: 122 mg/dL — ABNORMAL HIGH (ref 65–99)

## 2016-12-19 NOTE — Progress Notes (Signed)
Pt states she was instructed to stop arimidex 5 days prior to surgery per oncologist.  She was instructed to stop plavix 4 days prior to sugery per cardiologist.  Pt instructed to drink 8oz bottle water by 3:30 am day of surgery.

## 2016-12-19 NOTE — Pre-Procedure Instructions (Signed)
Susan Barnett  12/19/2016      [facility] [address] [place], [place] [address] Phone: [phone] Fax: [phone]    Your procedure is scheduled on Dec 25, 2016.  Report to [facility] Admitting at 5:30 A.M.  Call this number if you have problems the morning of surgery:  [phone]   Remember:  Do not eat food or drink liquids after midnight.  Take these medicines the morning of surgery with A SIP OF WATER :amLODipine (NORVASC), cetirizine (ZYRTEC),  levothyroxine (SYNTHROID, LEVOTHROID), metoprolol (LOPRESSOR), pantoprazole (PROTONIX), potassium chloride  SA (K-DUR,KLOR-CON)   STOP aspirin, herbal medications, vitamins, advil, aleve, ibuprofen, as of today   PLAVIX as directed     How to Manage Your Diabetes Before and After Surgery  Why is it important to control my blood sugar before and after surgery? . Improving blood sugar levels before and after surgery helps healing and can limit problems. . A way of improving blood sugar control is eating a healthy diet by: o  Eating less sugar and carbohydrates o  Increasing activity/exercise o  Talking with your doctor about reaching your blood sugar goals . High blood sugars (greater than 180 mg/dL) can raise your risk of infections and slow your recovery, so you will need to focus on controlling your diabetes during the weeks before surgery. . Make sure that the doctor who takes care of your diabetes knows about your planned surgery including the date and location.  How do I manage my blood sugar before surgery? . Check your blood sugar at least 4 times a day, starting 2 days before surgery, to make sure that the level is not too high or low. o Check your blood sugar the morning of your surgery when you wake up and every 2 hours until you get to the Short Stay unit. . If your blood sugar is less than 70 mg/dL, you will need to treat for  low blood sugar: o Do not take insulin. o Treat a low blood sugar (less than 70 mg/dL) with  cup of clear juice (cranberry or apple), 4 glucose tablets, OR glucose gel. o Recheck blood sugar in 15 minutes after treatment (to make sure it is greater than 70 mg/dL). If your blood sugar is not greater than 70 mg/dL on recheck, call [phone] for further instructions. . Report your blood sugar to the short stay nurse when you get to Short Stay.  . If you are admitted to the hospital after surgery: o Your blood sugar will be checked by the staff and you will probably be given insulin after surgery (instead of oral diabetes medicines) to make sure you have good blood sugar levels. o The goal for blood sugar control after surgery is 80-180 mg/dL.       WHAT DO I DO ABOUT MY DIABETES MEDICATION?   Marland Kitchen Do not take oral diabetes medicines (pills) the morning of surgery.  . THE NIGHT BEFORE SURGERY, take ___10____ units of __Lantus__insulin.      . The day of surgery, do not take other diabetes injectables, including Byetta (exenatide), Bydureon (exenatide ER), Victoza (liraglutide), or Trulicity (dulaglutide).   Patient Signature:  Date:   Nurse Signature:  Date:   Reviewed and Endorsed by [facility] Patient Education Committee, August 2015   Do not wear jewelry, make-up or nail polish.  Do not wear lotions, powders, or perfumes, or deoderant.  Do  not shave 48 hours prior to surgery.   Do not bring valuables to the hospital.  [facility] is not responsible for any belongings or valuables.  Contacts, dentures or bridgework may not be worn into surgery.  Leave your suitcase in the car.  After surgery it may be brought to your room.  For patients admitted to the hospital, discharge time will be determined by your treatment team.  Patients discharged the day of surgery will not be allowed to drive home.   Name and phone number of your driver:    Special instructions:  Preparing for  Surgery  Please read over the following fact sheets that you were given. Pain Booklet and Surgical Site Infection Prevention

## 2016-12-20 NOTE — Progress Notes (Signed)
Anesthesia chart review: Patient is a 71 year old female scheduled for radioactive seed localized left breast lumpectomy and axillary sentinel lymph node biopsy on 12/25/2016 by Dr. [name]. Procedure was initially scheduled for 11/13/16, but was postponed due to poorly controlled DM (A1c 14.0). It looks like patient now has Lantus insulin added to her previous regimen of Byrdureon and Janumet. Anesthesia is posted for general with pectoral block. Seed is being placed on 12/24/16.   History includes left breast cancer, CAD s/p BMS RCA '02 with restenosis s/p brachytherapy s/p DES RCA '06 s/p PTCA RCA 08/12/07 and 06/02/14 (not dilatable with conventional balloon therapy) s/p CABG (SVG-DIAG, SVG-PDA) 06/14/14, dysrhythmia (occasional "skipped beats"; tachycardia), DM2, never smoker, post-operative N/V, HTN, hypothyroidism, tremors (hands), hiatal hernia, anemia, OSA (CPAP), situational anxiety.   - PCP is Dr. Shirline Frees. Last visit 11/14/16 for hyperglycemia/poorly controlled DM. She was referred to Endocrinology, but hasn't been able to get an appointment until at least June (She is trying to see either Dr. Letta Median or Dr. Forde Dandy). Dorothy Engineer, production, PA at [employer] did start her on insulin and fasting CBGs now running typically < 140-150.   - Cardiologist is Dr. Daneen Schick. Last visit on 10/17/16 with Truitt Merle, NP for surgical clearance. She wrote, "she should be an acceptable candidate for surgery. Hold Plavix 4 days prior per Dr. [name] and restart when he deems appropriate. She is aspirin allergic. Will be available as needed. Discussed with Dr. Tamala Julian - he is in agreement with this plan - ok to proceed."   Meds include amlodipine, anastrozole (to hold five days prior), Lipitor, Bydureon, Zyrtec, clonidine, Plavix (to hold 4 days prior), Lantus, levothyroxine, losartan-HCTZ, Lopressor, nitroglycerin, Protonix, KCl, Janumet.  BP (!) 150/60   Pulse 80   Temp 36.4 C   Resp 20   Ht 5\' 2"  (1.575  m)   Wt 158 lb (71.7 kg)   SpO2 100%   BMI 28.90 kg/m   EKG 10/17/16: NSR.  Last cardiac cath was pre-CABG on 06/02/14. The LAD was widely patent and terminates at that apex with proximal 30%. 80% DIAG1. CX widely patent and giving origin to OM X 3. OM1 is small with 75%. RCA heavily stented with severe segmental in-stent 95% restenosis. EF 65%. Distal RCA was not dilatable with conventional balloon therapy. S/P CABG (SVG-DIAG, SVG-PDA) 06/02/14.    Carotid U/S 06/11/14: Summary: - Bilateral -1% to 39% ICA stenosis. Vertebral artery flow is antegrade.  Preoperative labs noted. Sodium 131. Potassium 3.7. Glucose 120. WBC 11.7. H&H 10.2 and 33.9. Platelet count 416.  Patient's glucose readings have improved since starting insulin. Based on labs and patient's account of home DM control, I would anticipate that she can proceed as planned if no acute changes.  George Hugh [facility] Short Stay Center/Anesthesiology Phone [phone] 12/20/2016 12:27 PM

## 2016-12-24 ENCOUNTER — Ambulatory Visit
Admission: RE | Admit: 2016-12-24 | Discharge: 2016-12-24 | Disposition: A | Discharge status: Home / Self Care | Payer: Medicare Other | Source: Ambulatory Visit | Attending: General Surgery | Admitting: General Surgery

## 2016-12-24 DIAGNOSIS — C50912 Malignant neoplasm of unspecified site of left female breast: Secondary | ICD-10-CM

## 2016-12-24 NOTE — Anesthesia Preprocedure Evaluation (Addendum)
Anesthesia Evaluation  Patient identified by MRN, date of birth, ID band Patient awake    Reviewed: Allergy & Precautions, NPO status , Patient's Chart, lab work & pertinent test results  History of Anesthesia Complications (+) PONV and history of anesthetic complications  Airway Mallampati: II  TM Distance: >3 FB Neck ROM: Limited    Dental  (+) Teeth Intact, Dental Advisory Given   Pulmonary shortness of breath and with exertion, sleep apnea and Continuous Positive Airway Pressure Ventilation ,    Pulmonary exam normal breath sounds clear to auscultation       Cardiovascular hypertension, Pt. on medications and Pt. on home beta blockers + CAD, + Cardiac Stents and + CABG  Normal cardiovascular exam Rhythm:Regular Rate:Normal     Neuro/Psych  Headaches, PSYCHIATRIC DISORDERS Anxiety    GI/Hepatic hiatal hernia, GERD  Medicated,  Endo/Other  diabetes, Type 2, Oral Hypoglycemic AgentsHypothyroidism   Renal/GU      Musculoskeletal  (+) Arthritis ,   Abdominal Normal abdominal exam  (+)   Peds  Hematology   Anesthesia Other Findings Progress Notes Date of Service: 12/19/2016 11:59 PM Susan Shoe, PA-C  Anesthesiology     Hide copied text Anesthesia chart review: Patient is a 71 year old female scheduled for radioactive seed localized left breast lumpectomy and axillary sentinel lymph node biopsy on 12/25/2016 by Dr. [name]. Procedure was initially scheduled for 11/13/16, but was postponed due to poorly controlled DM (A1c 14.0). It looks like patient now has Lantus insulin added to her previous regimen of Byrdureon and Janumet. Anesthesia is posted for general with pectoral block. Seed is being placed on 12/24/16.   History includes left breast cancer, CAD s/p BMS RCA '02 with restenosis s/p brachytherapy s/p DES RCA '06 s/p PTCA RCA 08/12/07 and 06/02/14 (not dilatable with conventional balloon therapy) s/p CABG  (SVG-DIAG, SVG-PDA) 06/14/14, dysrhythmia (occasional "skipped beats"; tachycardia), DM2, never smoker, post-operative N/V, HTN, hypothyroidism, tremors (hands), hiatal hernia, anemia, OSA (CPAP), situational anxiety.   - PCP is Dr. Shirline Frees. Last visit 11/14/16 for hyperglycemia/poorly controlled DM. She was referred to Endocrinology, but hasn't been able to get an appointment until at least June (She is trying to see either Dr. Letta Median or Dr. Forde Dandy). Dorothy Engineer, production, PA at [employer] did start her on insulin and fasting CBGs now running typically < 140-150.   - Cardiologist is Dr. Daneen Schick. Last visit on 10/17/16 with Truitt Merle, NP for surgical clearance. She wrote, "she should be an acceptable candidate for surgery. Hold Plavix 4 days prior per Dr. [name] and restart when he deems appropriate. She is aspirin allergic. Will be available as needed. Discussed with Dr. Tamala Julian - he is in agreement with this plan - ok to proceed."   Meds include amlodipine, anastrozole (to hold five days prior), Lipitor, Bydureon, Zyrtec, clonidine, Plavix (to hold 4 days prior), Lantus, levothyroxine, losartan-HCTZ, Lopressor, nitroglycerin, Protonix, KCl, Janumet.  BP (!) 150/60   Pulse 80   Temp 36.4 C   Resp 20   Ht 5\' 2"  (1.575 m)   Wt 158 lb (71.7 kg)   SpO2 100%   BMI 28.90 kg/m   EKG 10/17/16: NSR.  Last cardiac cath was pre-CABG on 06/02/14. The LAD was widely patent and terminates at that apex with proximal 30%. 80% DIAG1. CX widely patent and giving origin to OM X 3. OM1 is small with 75%. RCA heavily stented with severe segmental in-stent 95% restenosis. EF 65%. Distal RCA was not dilatable with conventional  balloon therapy. S/P CABG (SVG-DIAG, SVG-PDA) 06/02/14.    Carotid U/S 06/11/14: Summary: - Bilateral -1% to 39% ICA stenosis. Vertebral artery flow is antegrade.  Preoperative labs noted. Sodium 131. Potassium 3.7. Glucose 120. WBC 11.7. H&H 10.2 and 33.9. Platelet count  416.  Patient's glucose readings have improved since starting insulin. Based on labs and patient's account of home DM control, I would anticipate that she can proceed as planned if no acute changes.  [facility]/Anesthesiology Phone [phone] 12/20/2016 12:27 PM    Electronically signed by Susan Shoe, PA-C at 12/20/2016 12:27 PM    Pre-Admission Testing 60 on 12/19/2016      Detailed Report      Reproductive/Obstetrics                           Anesthesia Physical Anesthesia Plan  ASA: III  Anesthesia Plan: General   Post-op Pain Management:  Regional for Post-op pain   Induction: Intravenous  Airway Management Planned: LMA  Additional Equipment:   Intra-op Plan:   Post-operative Plan:   Informed Consent: I have reviewed the patients History and Physical, chart, labs and discussed the procedure including the risks, benefits and alternatives for the proposed anesthesia with the patient or authorized representative who has indicated his/her understanding and acceptance.   Dental advisory given  Plan Discussed with: CRNA, Anesthesiologist and Surgeon  Anesthesia Plan Comments:       Anesthesia Quick Evaluation

## 2016-12-24 NOTE — H&P (Signed)
History of Present Illness  The patient is a 71 year old female who presents with breast cancer. She is a 71 YO postmenopausal female referred by Dr. Jetta Lout for evaluation of recently diagnosed carcinoma of the left breast. She recently presented for a screening mamogram revealing a left breast mass associated with calcifications. Subsequent imaging included diagnostic mamogram showing an irregular mass with associated course calcifications in the upper left breast middle depth 12 to 1:00 axis measuring approximately 1 cm and ultrasound showing an oval mixed echogenicity mass at the 12:30 position 4 cm from the nipple corresponding to the mammographic findings measuring 1.9 cm in greatest diameter and the axilla appeared negative. An ultrasound guided breast biopsy was performed on October 01, 2016 with pathology revealing invasive ductal carcinoma of the breast with papillary features. She is seen now in the office for initial treatment planning. She has experienced no symptoms, specifically lump or nipple discharge, skin changes or pain. She does have a personal history of a left breast lumpectomy for benign disease number of years ago.  Findings at that time were the following: Tumor size: 1.9 cm Tumor grade: 1 Estrogen Receptor: Pos Progesterone Receptor: Pos Her-2 neu: Negative Lymph node status: Negative    Past Surgical History  Breast Biopsy  Left. Breast Mass; Local Excision  Left. Coronary Artery Bypass Graft  Knee Surgery  Left.  Diagnostic Studies History  Mammogram  within last year Pap Smear  1-5 years ago  Allergies  Aspirin *ANALGESICS - NonNarcotic*  Anaphylaxis. Compazine *ANTIPSYCHOTICS/ANTIMANIC AGENTS*  Swelling. NSAIDs  Hives, Swelling. Sulfa Antibiotics  Hives, Swelling. Zantac *ULCER DRUGS*  Hives, Swelling. Lisinopril *ANTIHYPERTENSIVES*  Cough. Shellfish   Medication History  AmLODIPine Besylate (5MG Tablet, Oral)  Active. Atorvastatin Calcium (10MG Tablet, Oral) Active. CloNIDine HCl (0.1MG Tablet, Oral) Active. Clopidogrel Bisulfate (75MG Tablet, Oral) Active. Bydureon (2MG Pen-injector, Subcutaneous) Active. Levothyroxine Sodium (75MCG Tablet, Oral) Active. Losartan Potassium-HCTZ (100-12.5MG Tablet, Oral) Active. Metoprolol Tartrate (50MG Tablet, Oral) Active. Pantoprazole Sodium (40MG Tablet DR, Oral) Active. Janumet (50-500MG Tablet, Oral) Active. Klor-Con M20 [facility] Tablet ER, Oral) Active. Multivitamin Adults (Oral) Active. Nitrostat (0.4MG Tab Sublingual, Sublingual as needed) Active. Co Q10 (100MG Capsule, Oral) Active. Tylenol PM Extra Strength (500-25MG Tablet, Oral as needed) Active. Medications Reconciled  Social History  Caffeine use  Carbonated beverages, Coffee, Tea. No alcohol use  No drug use  Tobacco use  Never smoker.  Family History Diabetes Mellitus  Mother. Heart Disease  Mother. Hypertension  Mother. Migraine Headache  Son. Respiratory Condition  Father.  Pregnancy / Birth History  Age at menarche  63 years. Age of menopause  68-55 Gravida  2 Length (months) of breastfeeding  7-12 Maternal age  12-30 Para  2  Other Problems  Back Pain  Breast Cancer  Chest pain  Gastroesophageal Reflux Disease  High blood pressure  Migraine Headache  Other disease, cancer, significant illness     Review of Systems  General Present- Fatigue. Not Present- Appetite Loss, Chills, Fever, Night Sweats, Weight Gain and Weight Loss. Skin Not Present- Change in Wart/Mole, Dryness, Hives, Jaundice, New Lesions, Non-Healing Wounds, Rash and Ulcer. HEENT Present- Ringing in the Ears, Seasonal Allergies and Wears glasses/contact lenses. Not Present- Earache, Hearing Loss, Hoarseness, Nose Bleed, Oral Ulcers, Sinus Pain, Sore Throat, Visual Disturbances and Yellow Eyes. Cardiovascular Present- Leg Cramps and Shortness of Breath. Not Present-  Chest Pain, Difficulty Breathing Lying Down, Palpitations, Rapid Heart Rate and Swelling of Extremities. Gastrointestinal Present- Indigestion. Not Present- Abdominal Pain, Bloating, Bloody  Stool, Change in Bowel Habits, Chronic diarrhea, Constipation, Difficulty Swallowing, Excessive gas, Gets full quickly at meals, Hemorrhoids, Nausea, Rectal Pain and Vomiting. Female Genitourinary Present- Urgency. Not Present- Frequency, Nocturia, Painful Urination and Pelvic Pain. Musculoskeletal Present- Back Pain, Joint Pain, Joint Stiffness and Muscle Weakness. Not Present- Muscle Pain and Swelling of Extremities. Neurological Present- Decreased Memory and Weakness. Not Present- Fainting, Headaches, Numbness, Seizures, Tingling, Tremor and Trouble walking. Psychiatric Not Present- Anxiety, Bipolar, Change in Sleep Pattern, Depression, Fearful and Frequent crying. Hematology Present- Blood Thinners. Not Present- Easy Bruising, Excessive bleeding, Gland problems, HIV and Persistent Infections.  Vitals   Weight: 161 lb Height: 62in Height was reported by patient. Body Surface Area: 1.74 m Body Mass Index: 29.45 kg/m  Temp.: 97.71F  Pulse: 84 (Regular)  BP: 142/80 (Sitting, Right Arm, Standard)       Physical Exam The physical exam findings are as follows: Note:General: Alert, well-developed and well nourished Caucasian female, in no distress Skin: Warm and dry without rash or infection. HEENT: No palpable masses or thyromegaly. Sclera nonicteric. Pupils equal round and reactive. Lymph nodes: No cervical, supraclavicular nodes palpable. Breasts: There is some mild bruising and thickening in the upper outer left breast post biopsy. No other palpable abnormalities. No skin changes. No nipple inversion or crusting. Lungs: Breath sounds clear and equal. No wheezing or increased work of breathing. Cardiovascular: Regular rate and rhythm without murmer. No JVD or edema. Abdomen:  Nondistended. Soft and nontender. No masses palpable. No organomegaly. No palpable hernias. Extremities: No edema or joint swelling or deformity. Neurologic: Alert and fully oriented. Gait normal. No focal weakness. Psychiatric: Normal mood and affect. Thought content appropriate with normal judgement and insight    Assessment & Plan  MALIGNANT NEOPLASM OF LEFT BREAST, STAGE 1, UNSPECIFIED ESTROGEN RECEPTOR STATUS (C50.912) Impression: 71 year old female with a new diagnosis of cancer of the left breast, upper outer quadrant. Clinical stage 1A, T1c N 0, hormone receptors pending, HER-2 negative. This appears to be a low-grade papillary lesion and very likely ER/PR positive. I discussed with the patient and her husband today initial surgical treatment options. We discussed options of breast conservation with lumpectomy or total mastectomy and sentinal lymph node biopsy/dissection. She would be an excellent candidate for breast conservation with lumpectomy and sentinel lymph node biopsy which is her preference. We discussed the indications and nature of the procedure, and expected recovery, in detail. Surgical risks including anesthetic complications, cardiorespiratory complications, bleeding, infection, wound healing complications, blood clots, lymphedema, local and distant recurrence and possible need for further surgery based on the final pathology was discussed and understood. Chemotherapy, hormonal therapy and radiation therapy have been discussed. They have been provided with literature regarding the treatment of breast cancer. All questions were answered. They understand and agree to proceed and we will go ahead with scheduling. Current Plans Referred to Oncology, for evaluation and follow up (Oncology). Routine. Referred to Radiation Oncology, for evaluation and follow up (Radiation Oncology). Routine. Referred to Physical Therapy, for evaluation and follow up (Physical Therapy). Routine. Pt  Education - [facility] Breast Cancer Information Given - [employer] "Breast Journey" Package I recommended obtaining preoperative cardiac clearance for

## 2016-12-25 ENCOUNTER — Ambulatory Visit
Admission: RE | Admit: 2016-12-25 | Discharge: 2016-12-25 | Disposition: A | Discharge status: Home / Self Care | Payer: Medicare Other | Source: Ambulatory Visit | Attending: General Surgery | Admitting: General Surgery

## 2016-12-25 ENCOUNTER — Ambulatory Visit (HOSPITAL_COMMUNITY)
Admission: RE | Admit: 2016-12-25 | Discharge: 2016-12-25 | Disposition: A | Discharge status: Home / Self Care | Payer: Medicare Other | Source: Ambulatory Visit | Attending: General Surgery | Admitting: General Surgery

## 2016-12-25 ENCOUNTER — Encounter (HOSPITAL_COMMUNITY)
Admission: RE | Disposition: A | Discharge status: Home / Self Care | Payer: Self-pay | Source: Ambulatory Visit | Attending: General Surgery

## 2016-12-25 ENCOUNTER — Ambulatory Visit (HOSPITAL_COMMUNITY): Payer: Medicare Other | Admitting: Anesthesiology

## 2016-12-25 ENCOUNTER — Ambulatory Visit (HOSPITAL_COMMUNITY): Payer: Medicare Other | Admitting: Vascular Surgery

## 2016-12-25 ENCOUNTER — Encounter (HOSPITAL_COMMUNITY): Payer: Self-pay | Admitting: Certified Registered Nurse Anesthetist

## 2016-12-25 DIAGNOSIS — K219 Gastro-esophageal reflux disease without esophagitis: Secondary | ICD-10-CM | POA: Diagnosis not present

## 2016-12-25 DIAGNOSIS — C50912 Malignant neoplasm of unspecified site of left female breast: Secondary | ICD-10-CM

## 2016-12-25 DIAGNOSIS — F419 Anxiety disorder, unspecified: Secondary | ICD-10-CM | POA: Insufficient documentation

## 2016-12-25 DIAGNOSIS — Z886 Allergy status to analgesic agent status: Secondary | ICD-10-CM | POA: Insufficient documentation

## 2016-12-25 DIAGNOSIS — Z7984 Long term (current) use of oral hypoglycemic drugs: Secondary | ICD-10-CM | POA: Insufficient documentation

## 2016-12-25 DIAGNOSIS — Z951 Presence of aortocoronary bypass graft: Secondary | ICD-10-CM | POA: Insufficient documentation

## 2016-12-25 DIAGNOSIS — G8918 Other acute postprocedural pain: Secondary | ICD-10-CM | POA: Diagnosis not present

## 2016-12-25 DIAGNOSIS — I10 Essential (primary) hypertension: Secondary | ICD-10-CM | POA: Diagnosis not present

## 2016-12-25 DIAGNOSIS — Z79899 Other long term (current) drug therapy: Secondary | ICD-10-CM | POA: Insufficient documentation

## 2016-12-25 DIAGNOSIS — Z170 Estrogen receptor positive status [ER+]: Secondary | ICD-10-CM | POA: Diagnosis not present

## 2016-12-25 DIAGNOSIS — E119 Type 2 diabetes mellitus without complications: Secondary | ICD-10-CM | POA: Diagnosis not present

## 2016-12-25 DIAGNOSIS — R928 Other abnormal and inconclusive findings on diagnostic imaging of breast: Secondary | ICD-10-CM | POA: Diagnosis not present

## 2016-12-25 DIAGNOSIS — C50412 Malignant neoplasm of upper-outer quadrant of left female breast: Secondary | ICD-10-CM | POA: Diagnosis not present

## 2016-12-25 DIAGNOSIS — I2510 Atherosclerotic heart disease of native coronary artery without angina pectoris: Secondary | ICD-10-CM | POA: Diagnosis not present

## 2016-12-25 DIAGNOSIS — E039 Hypothyroidism, unspecified: Secondary | ICD-10-CM | POA: Diagnosis not present

## 2016-12-25 HISTORY — PX: BREAST LUMPECTOMY WITH RADIOACTIVE SEED AND SENTINEL LYMPH NODE BIOPSY: SHX6550

## 2016-12-25 LAB — GLUCOSE, CAPILLARY
Glucose-Capillary: 121 mg/dL — ABNORMAL HIGH (ref 65–99)
Glucose-Capillary: 122 mg/dL — ABNORMAL HIGH (ref 65–99)

## 2016-12-25 SURGERY — BREAST LUMPECTOMY WITH RADIOACTIVE SEED AND SENTINEL LYMPH NODE BIOPSY
Anesthesia: General | Site: Breast | Laterality: Left

## 2016-12-25 MED ORDER — ONDANSETRON HCL 4 MG/2ML IJ SOLN
INTRAMUSCULAR | Status: AC
Start: 2016-12-25 — End: ?
  Filled 2016-12-25: qty 2

## 2016-12-25 MED ORDER — PHENYLEPHRINE 40 MCG/ML (10ML) SYRINGE FOR IV PUSH (FOR BLOOD PRESSURE SUPPORT)
PREFILLED_SYRINGE | INTRAVENOUS | Status: AC
  Filled 2016-12-25: qty 10

## 2016-12-25 MED ORDER — ONDANSETRON HCL 4 MG/2ML IJ SOLN
INTRAMUSCULAR | Status: DC | PRN
  Administered 2016-12-25 (×2): 4 mg via INTRAVENOUS

## 2016-12-25 MED ORDER — ACETAMINOPHEN 500 MG PO TABS
ORAL_TABLET | ORAL | Status: AC
  Administered 2016-12-25: 1000 mg via ORAL
  Filled 2016-12-25: qty 2

## 2016-12-25 MED ORDER — BUPIVACAINE-EPINEPHRINE 0.25% -1:200000 IJ SOLN
INTRAMUSCULAR | Status: DC | PRN
  Administered 2016-12-25: 10 mL

## 2016-12-25 MED ORDER — FENTANYL CITRATE (PF) 250 MCG/5ML IJ SOLN
INTRAMUSCULAR | Status: AC
  Filled 2016-12-25: qty 5

## 2016-12-25 MED ORDER — BUPIVACAINE HCL (PF) 0.25 % IJ SOLN
INTRAMUSCULAR | Status: DC | PRN
  Administered 2016-12-25 (×2): 20 mL

## 2016-12-25 MED ORDER — FENTANYL CITRATE (PF) 100 MCG/2ML IJ SOLN
25.0000 ug | INTRAMUSCULAR | Status: DC | PRN
  Administered 2016-12-25 (×2): 25 ug via INTRAVENOUS

## 2016-12-25 MED ORDER — MIDAZOLAM HCL 2 MG/2ML IJ SOLN
INTRAMUSCULAR | Status: DC | PRN
  Administered 2016-12-25: 1 mg via INTRAVENOUS

## 2016-12-25 MED ORDER — CHLORHEXIDINE GLUCONATE CLOTH 2 % EX PADS: 6.0000 | MEDICATED_PAD | Freq: Once | CUTANEOUS | Status: DC

## 2016-12-25 MED ORDER — 0.9 % SODIUM CHLORIDE (POUR BTL) OPTIME
TOPICAL | Status: DC | PRN
  Administered 2016-12-25: 1000 mL

## 2016-12-25 MED ORDER — LIDOCAINE 2% (20 MG/ML) 5 ML SYRINGE
INTRAMUSCULAR | Status: AC
  Filled 2016-12-25: qty 5

## 2016-12-25 MED ORDER — ACETAMINOPHEN 10 MG/ML IV SOLN
1000.0000 mg | Freq: Once | INTRAVENOUS | Status: DC | PRN
  Administered 2016-12-25: 1000 mg via INTRAVENOUS

## 2016-12-25 MED ORDER — PROPOFOL 10 MG/ML IV BOLUS
INTRAVENOUS | Status: AC
  Filled 2016-12-25: qty 20

## 2016-12-25 MED ORDER — ONDANSETRON HCL 4 MG/2ML IJ SOLN: 4.0000 mg | Freq: Once | INTRAMUSCULAR | Status: DC | PRN

## 2016-12-25 MED ORDER — FENTANYL CITRATE (PF) 100 MCG/2ML IJ SOLN
INTRAMUSCULAR | Status: DC | PRN
  Administered 2016-12-25: 50 ug via INTRAVENOUS

## 2016-12-25 MED ORDER — GABAPENTIN 300 MG PO CAPS
300.0000 mg | ORAL_CAPSULE | ORAL | Status: AC
  Administered 2016-12-25: 300 mg via ORAL

## 2016-12-25 MED ORDER — CEFAZOLIN SODIUM-DEXTROSE 2-4 GM/100ML-% IV SOLN
INTRAVENOUS | Status: AC
  Filled 2016-12-25: qty 100

## 2016-12-25 MED ORDER — PHENYLEPHRINE 40 MCG/ML (10ML) SYRINGE FOR IV PUSH (FOR BLOOD PRESSURE SUPPORT)
PREFILLED_SYRINGE | INTRAVENOUS | Status: DC | PRN
  Administered 2016-12-25: 40 ug via INTRAVENOUS

## 2016-12-25 MED ORDER — ACETAMINOPHEN 500 MG PO TABS
1000.0000 mg | ORAL_TABLET | ORAL | Status: AC
  Administered 2016-12-25: 1000 mg via ORAL

## 2016-12-25 MED ORDER — EPHEDRINE 5 MG/ML INJ
INTRAVENOUS | Status: AC
  Filled 2016-12-25: qty 10

## 2016-12-25 MED ORDER — ACETAMINOPHEN 500 MG PO TABS
ORAL_TABLET | ORAL | Status: AC
  Filled 2016-12-25: qty 2

## 2016-12-25 MED ORDER — OXYCODONE HCL 5 MG PO TABS: 5.0000 mg | ORAL_TABLET | Freq: Four times a day (QID) | ORAL | 0 refills | Status: DC | PRN

## 2016-12-25 MED ORDER — DEXAMETHASONE SODIUM PHOSPHATE 10 MG/ML IJ SOLN
INTRAMUSCULAR | Status: AC
  Filled 2016-12-25: qty 1

## 2016-12-25 MED ORDER — EPHEDRINE SULFATE-NACL 50-0.9 MG/10ML-% IV SOSY
PREFILLED_SYRINGE | INTRAVENOUS | Status: DC | PRN
  Administered 2016-12-25 (×4): 5 mg via INTRAVENOUS

## 2016-12-25 MED ORDER — LACTATED RINGERS IV SOLN
INTRAVENOUS | Status: DC | PRN
  Administered 2016-12-25 (×2): via INTRAVENOUS

## 2016-12-25 MED ORDER — BUPIVACAINE HCL (PF) 0.25 % IJ SOLN
INTRAMUSCULAR | Status: AC
  Filled 2016-12-25: qty 30

## 2016-12-25 MED ORDER — TECHNETIUM TC 99M SULFUR COLLOID FILTERED
1.0000 | Freq: Once | INTRAVENOUS | Status: AC | PRN
  Administered 2016-12-25: 1 via INTRADERMAL

## 2016-12-25 MED ORDER — GABAPENTIN 300 MG PO CAPS
ORAL_CAPSULE | ORAL | Status: AC
  Administered 2016-12-25: 300 mg via ORAL
  Filled 2016-12-25: qty 1

## 2016-12-25 MED ORDER — PROPOFOL 10 MG/ML IV BOLUS
INTRAVENOUS | Status: DC | PRN
  Administered 2016-12-25: 100 mg via INTRAVENOUS
  Administered 2016-12-25: 20 mg via INTRAVENOUS

## 2016-12-25 MED ORDER — ACETAMINOPHEN 10 MG/ML IV SOLN
INTRAVENOUS | Status: AC
  Filled 2016-12-25: qty 100

## 2016-12-25 MED ORDER — FENTANYL CITRATE (PF) 100 MCG/2ML IJ SOLN
INTRAMUSCULAR | Status: AC
  Administered 2016-12-25: 25 ug via INTRAVENOUS
  Filled 2016-12-25: qty 2

## 2016-12-25 MED ORDER — MIDAZOLAM HCL 2 MG/2ML IJ SOLN
INTRAMUSCULAR | Status: AC
  Filled 2016-12-25: qty 2

## 2016-12-25 MED ORDER — ROCURONIUM BROMIDE 10 MG/ML (PF) SYRINGE
PREFILLED_SYRINGE | INTRAVENOUS | Status: AC
  Filled 2016-12-25: qty 5

## 2016-12-25 MED ORDER — OXYCODONE HCL 5 MG PO TABS
10.0000 mg | ORAL_TABLET | Freq: Once | ORAL | Status: AC
  Administered 2016-12-25: 10 mg via ORAL

## 2016-12-25 MED ORDER — CEFAZOLIN SODIUM-DEXTROSE 2-4 GM/100ML-% IV SOLN
2.0000 g | INTRAVENOUS | Status: AC
  Administered 2016-12-25: 2 g via INTRAVENOUS

## 2016-12-25 MED ORDER — OXYCODONE HCL 5 MG PO TABS
ORAL_TABLET | ORAL | Status: AC
  Filled 2016-12-25: qty 2

## 2016-12-25 MED ORDER — METHYLENE BLUE 0.5 % INJ SOLN
INTRAVENOUS | Status: AC
  Filled 2016-12-25: qty 10

## 2016-12-25 MED ORDER — LIDOCAINE 2% (20 MG/ML) 5 ML SYRINGE
INTRAMUSCULAR | Status: DC | PRN
  Administered 2016-12-25: 60 mg via INTRAVENOUS

## 2016-12-25 MED ORDER — DEXAMETHASONE SODIUM PHOSPHATE 10 MG/ML IJ SOLN
INTRAMUSCULAR | Status: DC | PRN
  Administered 2016-12-25: 5 mg via INTRAVENOUS

## 2016-12-25 MED ORDER — MEPERIDINE HCL 25 MG/ML IJ SOLN: 6.2500 mg | INTRAMUSCULAR | Status: DC | PRN

## 2016-12-25 SURGICAL SUPPLY — 48 items
APPLIER CLIP 9.375 MED OPEN (MISCELLANEOUS)
BINDER BREAST LRG (GAUZE/BANDAGES/DRESSINGS) IMPLANT
BINDER BREAST XLRG (GAUZE/BANDAGES/DRESSINGS) IMPLANT
BLADE SURG 15 STRL LF DISP TIS (BLADE) ×1 IMPLANT
BLADE SURG 15 STRL SS (BLADE) ×1
CANISTER SUCT 3000ML PPV (MISCELLANEOUS) ×2 IMPLANT
CHLORAPREP W/TINT 26ML (MISCELLANEOUS) ×2 IMPLANT
CLIP APPLIE 9.375 MED OPEN (MISCELLANEOUS) IMPLANT
CLIP TI MEDIUM 6 (CLIP) ×2 IMPLANT
CONT SPEC 4OZ CLIKSEAL STRL BL (MISCELLANEOUS) ×2 IMPLANT
COVER PROBE W GEL 5X96 (DRAPES) ×2 IMPLANT
COVER SURGICAL LIGHT HANDLE (MISCELLANEOUS) ×2 IMPLANT
DERMABOND ADVANCED (GAUZE/BANDAGES/DRESSINGS) ×1
DERMABOND ADVANCED .7 DNX12 (GAUZE/BANDAGES/DRESSINGS) ×1 IMPLANT
DEVICE DUBIN SPECIMEN MAMMOGRA (MISCELLANEOUS) ×2 IMPLANT
DRAPE CHEST BREAST 15X10 FENES (DRAPES) ×2 IMPLANT
DRAPE SURG 17X23 STRL (DRAPES) ×8 IMPLANT
DRAPE UTILITY XL STRL (DRAPES) ×2 IMPLANT
ELECT COATED BLADE 2.86 ST (ELECTRODE) ×2 IMPLANT
ELECT REM PT RETURN 9FT ADLT (ELECTROSURGICAL) ×2
ELECTRODE REM PT RTRN 9FT ADLT (ELECTROSURGICAL) ×1 IMPLANT
GLOVE BIOGEL PI IND STRL 8 (GLOVE) ×1 IMPLANT
GLOVE BIOGEL PI INDICATOR 8 (GLOVE) ×1
GLOVE ECLIPSE 7.5 STRL STRAW (GLOVE) ×4 IMPLANT
GOWN STRL REUS W/ TWL LRG LVL3 (GOWN DISPOSABLE) ×1 IMPLANT
GOWN STRL REUS W/ TWL XL LVL3 (GOWN DISPOSABLE) ×1 IMPLANT
GOWN STRL REUS W/TWL LRG LVL3 (GOWN DISPOSABLE) ×1
GOWN STRL REUS W/TWL XL LVL3 (GOWN DISPOSABLE) ×1
ILLUMINATOR WAVEGUIDE N/F (MISCELLANEOUS) IMPLANT
KIT BASIN OR (CUSTOM PROCEDURE TRAY) ×2 IMPLANT
KIT MARKER MARGIN INK (KITS) ×2 IMPLANT
NDL SAFETY ECLIPSE 18X1.5 (NEEDLE) IMPLANT
NEEDLE FILTER BLUNT 18X 1/2SAF (NEEDLE)
NEEDLE FILTER BLUNT 18X1 1/2 (NEEDLE) IMPLANT
NEEDLE HYPO 18GX1.5 SHARP (NEEDLE)
NEEDLE HYPO 25X1 1.5 SAFETY (NEEDLE) ×2 IMPLANT
NS IRRIG 1000ML POUR BTL (IV SOLUTION) ×2 IMPLANT
PACK SURGICAL SETUP 50X90 (CUSTOM PROCEDURE TRAY) ×2 IMPLANT
PENCIL BUTTON HOLSTER BLD 10FT (ELECTRODE) ×2 IMPLANT
SPONGE LAP 18X18 X RAY DECT (DISPOSABLE) ×2 IMPLANT
SUT MON AB 5-0 PS2 18 (SUTURE) ×4 IMPLANT
SUT VIC AB 3-0 SH 18 (SUTURE) ×2 IMPLANT
SYR BULB 3OZ (MISCELLANEOUS) ×2 IMPLANT
SYR CONTROL 10ML LL (SYRINGE) ×2 IMPLANT
TOWEL OR 17X24 6PK STRL BLUE (TOWEL DISPOSABLE) ×2 IMPLANT
TOWEL OR 17X26 10 PK STRL BLUE (TOWEL DISPOSABLE) ×2 IMPLANT
TUBE CONNECTING 12X1/4 (SUCTIONS) ×2 IMPLANT
YANKAUER SUCT BULB TIP NO VENT (SUCTIONS) ×2 IMPLANT

## 2016-12-25 NOTE — Transfer of Care (Signed)
Immediate Anesthesia Transfer of Care Note  Patient: Susan Barnett  Procedure(s) Performed: Procedure(s): BREAST LUMPECTOMY WITH RADIOACTIVE SEED AND SENTINEL LYMPH NODE BIOPSY (Left)  Patient Location: PACU  Anesthesia Type:General and Regional  Level of Consciousness: awake and alert   Airway & Oxygen Therapy: Patient Spontanous Breathing and Patient connected to nasal cannula oxygen  Post-op Assessment: Report given to RN, Post -op Vital signs reviewed and stable and Patient moving all extremities X 4  Post vital signs: Reviewed and stable  Last Vitals:  Vitals:   12/25/16 0640 12/25/16 0641  BP: (!) 165/65   Pulse: 74   Resp: 20   Temp:  36.7 C    Last Pain:  Vitals:   12/25/16 0641  TempSrc: Oral         Complications: No apparent anesthesia complications

## 2016-12-25 NOTE — Discharge Instructions (Signed)
[facility] °Office Phone Number [phone] ° °BREAST BIOPSY/ PARTIAL MASTECTOMY: POST OP INSTRUCTIONS ° °Always review your discharge instruction sheet given to you by the facility where your surgery was performed. ° °IF YOU HAVE DISABILITY OR FAMILY LEAVE FORMS, YOU MUST BRING THEM TO THE OFFICE FOR PROCESSING.  DO NOT GIVE THEM TO YOUR DOCTOR. ° °1. A prescription for pain medication may be given to you upon discharge.  Take your pain medication as prescribed, if needed.  If narcotic pain medicine is not needed, then you may take acetaminophen (Tylenol) or ibuprofen (Advil) as needed. °2. Take your usually prescribed medications unless otherwise directed °3. If you need a refill on your pain medication, please contact your pharmacy.  They will contact our office to request authorization.  Prescriptions will not be filled after 5pm or on week-ends. °4. You should eat very light the first 24 hours after surgery, such as soup, crackers, pudding, etc.  Resume your normal diet the day after surgery. °5. Most patients will experience some swelling and bruising in the breast.  Ice packs and a good support bra will help.  Swelling and bruising can take several days to resolve.  °6. It is common to experience some constipation if taking pain medication after surgery.  Increasing fluid intake and taking a stool softener will usually help or prevent this problem from occurring.  A mild laxative (Milk of Magnesia or Miralax) should be taken according to package directions if there are no bowel movements after 48 hours. °7. Unless discharge instructions indicate otherwise, you may remove your bandages 24-48 hours after surgery, and you may shower at that time.  You may have steri-strips (small skin tapes) in place directly over the incision.  These strips should be left on the skin for 7-10 days.  If your surgeon used skin glue on the incision, you may shower in 24 hours.  The glue will flake off over the  next 2-3 weeks.  Any sutures or staples will be removed at the office during your follow-up visit. °8. ACTIVITIES:  You may resume regular daily activities (gradually increasing) beginning the next day.  Wearing a good support bra or sports bra minimizes pain and swelling.  You may have sexual intercourse when it is comfortable. °a. You may drive when you no longer are taking prescription pain medication, you can comfortably wear a seatbelt, and you can safely maneuver your car and apply brakes. °b. RETURN TO WORK:  ______________________________________________________________________________________ °9. You should see your doctor in the office for a follow-up appointment approximately two weeks after your surgery.  Your doctor’s nurse will typically make your follow-up appointment when she calls you with your pathology report.  Expect your pathology report 2-3 business days after your surgery.  You may call to check if you do not hear from us after three days. °10. OTHER INSTRUCTIONS: _______________________________________________________________________________________________ _____________________________________________________________________________________________________________________________________ °_____________________________________________________________________________________________________________________________________ °_____________________________________________________________________________________________________________________________________ ° °WHEN TO CALL YOUR DOCTOR: °1. Fever over 101.0 °2. Nausea and/or vomiting. °3. Extreme swelling or bruising. °4. Continued bleeding from incision. °5. Increased pain, redness, or drainage from the incision. ° °The clinic staff is available to answer your questions during regular business hours.  Please don’t hesitate to call and ask to speak to one of the nurses for clinical concerns.  If you have a medical emergency, go to the nearest  emergency room or call 911.  A surgeon from [facility] is always on call at the hospital. ° °For further questions, please visit [email or web address]  °

## 2016-12-25 NOTE — Anesthesia Postprocedure Evaluation (Addendum)
Anesthesia Post Note  Patient: Susan Barnett  Procedure(s) Performed: Procedure(s) (LRB): BREAST LUMPECTOMY WITH RADIOACTIVE SEED AND SENTINEL LYMPH NODE BIOPSY (Left)  Patient location during evaluation: PACU Anesthesia Type: General Level of consciousness: awake Pain management: pain level controlled Vital Signs Assessment: post-procedure vital signs reviewed and stable Respiratory status: spontaneous breathing Cardiovascular status: stable Postop Assessment: no signs of nausea or vomiting Anesthetic complications: no        Last Vitals:  Vitals:   12/25/16 0915 12/25/16 0930  BP: (!) 142/69 120/74  Pulse: 83 79  Resp: 14 13  Temp: 36.6 C     Last Pain:  Vitals:   12/25/16 0641  TempSrc: Oral   Pain Goal:                 Latonga Ponder JR,JOHN Estefana Taylor

## 2016-12-25 NOTE — Interval H&P Note (Signed)
History and Physical Interval Note:  12/25/2016 7:09 AM  Susan Barnett  has presented today for surgery, with the diagnosis of LEFT BREAST CANCER  The various methods of treatment have been discussed with the patient and family. After consideration of risks, benefits and other options for treatment, the patient has consented to  Procedure(s): BREAST LUMPECTOMY WITH RADIOACTIVE SEED AND SENTINEL LYMPH NODE BIOPSY (Left) as a surgical intervention .  The patient's history has been reviewed, patient examined, no change in status, stable for surgery.  I have reviewed the patient's chart and labs.  Questions were answered to the patient's satisfaction.     Normon Pettijohn T

## 2016-12-25 NOTE — Anesthesia Procedure Notes (Signed)
Procedure Name: LMA Insertion Date/Time: 12/25/2016 7:57 AM Performed by: Garrison Columbus T Pre-anesthesia Checklist: Patient identified, Emergency Drugs available, Suction available and Patient being monitored Patient Re-evaluated:Patient Re-evaluated prior to inductionOxygen Delivery Method: Circle System Utilized Preoxygenation: Pre-oxygenation with 100% oxygen Intubation Type: IV induction LMA: LMA inserted LMA Size: 4.0 Number of attempts: 1 Airway Equipment and Method: Bite block Placement Confirmation: positive ETCO2 Tube secured with: Tape Dental Injury: Teeth and Oropharynx as per pre-operative assessment

## 2016-12-25 NOTE — Anesthesia Procedure Notes (Addendum)
Anesthesia Regional Block: Pectoralis block   Pre-Anesthetic Checklist: ,, timeout performed, Correct Patient, Correct Site, Correct Laterality, Correct Procedure, Correct Position, site marked, Risks and benefits discussed,  Surgical consent,  Pre-op evaluation,  At surgeon's request and post-op pain management  Laterality: Left and Upper  Prep: chloraprep       Needles:  Injection technique: Single-shot  Needle Type: Echogenic Stimulator Needle     Needle Length: 9cm  Needle Gauge: 21   Needle insertion depth: 4 cm   Additional Needles:   Procedures: ultrasound guided,,,,,,,,  Narrative:  Start time: 12/25/2016 7:20 AM End time: 12/25/2016 7:27 AM Injection made incrementally with aspirations every 5 mL.  Performed by: Personally  Anesthesiologist: Lyn Hollingshead

## 2016-12-25 NOTE — Op Note (Signed)
Preoperative Diagnosis: LEFT BREAST CANCER  Postoprative Diagnosis: LEFT BREAST CANCER  Procedure: Procedure(s): BREAST LUMPECTOMY WITH RADIOACTIVE SEED AND SENTINEL deep axillary  LYMPH NODE BIOPSY   Surgeon: [name] T   Assistants: None  Anesthesia:  General LMA anesthesia  Indications:  Patient is a 71 year old female with a recent diagnosis of left breast cancer, ER/PR positive invasive ductal stage IA, T1 cN0. After preoperative evaluation and discussion detailed elsewhere we have elected to proceed with radioactive seed localized left breast lumpectomy and axillary sentinel lymph node biopsy is initial surgical treatment.    Procedure Detail:  Patient had previously undergone accurate placement of a radioactive seed at the tumor and marking clip site in the upper outer quadrant of the left breast. In the holding area she underwent injection of 1 mCi of technetium sulfur colloid intradermally around the left nipple. She was taken to the operating room, placed in the supine position on the operating table, and laryngeal mask general anesthesia induced. Using the neoprobe, I confirmed that there was an area of high counts in the left axilla. She received preoperative IV antibiotics. PAS replaced. The entire left breast and axilla and upper arm were widely sterilely prepped and draped. Patient timeout was performed and correct procedure verified. The lumpectomy was approached initially. The seed was localized about 3 or 4 cm off the areolar border in the upper outer quadrant. A curvilinear incision near the areolar border was made and dissection carried down into the subcutaneous tissue. A short skin and subcutaneous flap was then raised superiorly over the area of high counts. There was a subtle palpable mass. Using the neoprobe for guidance a generous lumpectomy was performed around the seed using the neoprobe for guidance. Approximately 4 cm globular specimen of tissue was removed.  This was inked for margins. Specimen x-ray showed the seed and marking clip centrally located within the specimen. Hemostasis was obtained with cautery. The lumpectomy cavity was marked with clips. The breast and subcutaneous tissue was closed with interrupted 3-0 Vicryl. Attention was then turned to the sentinel node. A small transverse incision was made in the left axilla over the area of high counts. Dissection was carried down through the subcutaneous tissue and clavipectoral fascia using cautery. Using the neoprobe for guidance with careful blunt dissection I dissected down onto a slightly enlarged deep axillary node node with very high counts. Using cautery this was excised. A feeder vessel was suture ligated. Ex vivo the node had counts of about 1200. A second area more anteriorly of high counts was noted and again using the appropriate guides and bluntly dissected down onto a more anterior deep axillary node with counts of about 300 ex vivo. Following this there were no significant counts in the axilla. Hemostasis was assured. The deep axillary and subcutaneous tissue was closed with interrupted 3-0 Vicryl. Skin incisions were infiltrated with Marcaine and skin closed with running subcuticular 5-0 Monocryl and Dermabond. Sponge needle and instrument counts were correct.   Findings:  As above   Estimated Blood Loss:  Minimal         Drains: None  Blood Given: none          Specimens: #1 left breast lumpectomy   #2 left axillary sentinel lymph node   #3 left axillary sentinel lymph node        Complications:  * No complications entered in OR log *         Disposition: PACU - hemodynamically stable.  Condition: stable

## 2016-12-26 ENCOUNTER — Encounter (HOSPITAL_COMMUNITY): Payer: Self-pay | Admitting: General Surgery

## 2016-12-26 DIAGNOSIS — C50912 Malignant neoplasm of unspecified site of left female breast: Secondary | ICD-10-CM | POA: Diagnosis not present

## 2017-01-01 ENCOUNTER — Encounter: Payer: Self-pay | Admitting: Hematology and Oncology

## 2017-01-01 ENCOUNTER — Ambulatory Visit (HOSPITAL_BASED_OUTPATIENT_CLINIC_OR_DEPARTMENT_OTHER): Payer: Medicare Other | Admitting: Hematology and Oncology

## 2017-01-01 DIAGNOSIS — C50412 Malignant neoplasm of upper-outer quadrant of left female breast: Secondary | ICD-10-CM | POA: Diagnosis not present

## 2017-01-01 DIAGNOSIS — D509 Iron deficiency anemia, unspecified: Secondary | ICD-10-CM | POA: Diagnosis not present

## 2017-01-01 DIAGNOSIS — B372 Candidiasis of skin and nail: Secondary | ICD-10-CM | POA: Diagnosis not present

## 2017-01-01 DIAGNOSIS — Z170 Estrogen receptor positive status [ER+]: Secondary | ICD-10-CM | POA: Diagnosis not present

## 2017-01-01 NOTE — Assessment & Plan Note (Signed)
Left breast biopsy 10/01/2016: 12:30: IDC with DCIS involving underlying papillary lesion, grade 1, ER 100%, PR 90%, Ki-67 10%, HER-2 negative ratio 1.29; irregular 1.9 cm mass axilla negative, T1 CN 0 stage IA clinical stage Left lumpectomy 12/25/2016: IDC grade 1, 1.5 cm, low-grade DCIS, resection margins negative, 0/2 lymph nodes negative, ER 100%, PR 100%, HER-2 negative ratio 1.29, Ki-67 10% T1 cN0 stage IA  Pathology counseling: I discussed the final pathology report of the patient provided  a copy of this report. I discussed the margins as well as lymph node surgeries. We also discussed the final staging along with previously performed ER/PR and HER-2/neu testing.  Recommendations: 1. Oncotype DX testing to determine if chemotherapy would be of any benefit followed by 2. Adjuvant radiation therapy followed by 3. Adjuvant antiestrogen therapy  Return to clinic based upon Oncotype DX test results 

## 2017-01-01 NOTE — Progress Notes (Signed)
Patient Care Team: Shirline Frees, MD as PCP - General (Family Medicine)  DIAGNOSIS:  Encounter Diagnosis  Name Primary?  . Malignant neoplasm of upper-outer quadrant of left breast in female, estrogen receptor positive ([facility])     SUMMARY OF ONCOLOGIC HISTORY:   Malignant neoplasm of upper-outer quadrant of left breast in female, estrogen receptor positive ([facility])   10/01/2016 Initial Diagnosis    Left breast biopsy: 12:30: IDC with DCIS involving underlying papillary lesion, grade 1, ER 100%, PR 90%, Ki-67 10%, HER-2 negative ratio 1.29; irregular 1.9 cm mass axilla negative, T1 CN 0 stage IA clinical stage      12/25/2016 Surgery    Left lumpectomy: IDC grade 1, 1.5 cm, low-grade DCIS, resection margins negative, 0/2 lymph nodes negative, ER 100%, PR 100%, HER-2 negative ratio 1.29, Ki-67 10% T1 cN0 stage IA       CHIEF COMPLIANT: follow-up after left lumpectomy  INTERVAL HISTORY: Susan Barnett is a 71 year old with above-mentioned is left breast cancer treated with lumpectomy and is here to discuss the surgical result. She is healing very well from the recent surgery. She has discomfort underneath the armpit. There is slight swelling in the breast tissue.  REVIEW OF SYSTEMS:   Constitutional: Denies fevers, chills or abnormal weight loss Eyes: Denies blurriness of vision Ears, nose, mouth, throat, and face: Denies mucositis or sore throat Respiratory: Denies cough, dyspnea or wheezes Cardiovascular: Denies palpitation, chest discomfort Gastrointestinal:  Denies nausea, heartburn or change in bowel habits Skin: Denies abnormal skin rashes Lymphatics: Denies new lymphadenopathy or easy bruising Neurological:Denies numbness, tingling or new weaknesses Behavioral/Psych: Mood is stable, no new changes  Extremities: No lower extremity edema Breast: recent left lumpectomy All other systems were reviewed with the patient and are negative.  I have reviewed the past medical  history, past surgical history, social history and family history with the patient and they are unchanged from previous note.  ALLERGIES:  is allergic to asa [aspirin]; compazine [prochlorperazine edisylate]; nsaids; shellfish allergy; sulfa antibiotics; zantac [ranitidine hcl]; and lisinopril.  MEDICATIONS:  Current Outpatient Prescriptions  Medication Sig Dispense Refill  . amLODipine (NORVASC) 5 MG tablet Take 5 mg by mouth daily.     Marland Kitchen anastrozole (ARIMIDEX) 1 MG tablet Take 1 tablet (1 mg total) by mouth daily. 30 tablet 6  . atorvastatin (LIPITOR) 10 MG tablet Take 10 mg by mouth every evening.     . bisacodyl (DULCOLAX) 5 MG EC tablet Take 5 mg by mouth daily as needed for mild constipation.    Marland Kitchen BYDUREON 2 MG PEN Inject 2 mg into the skin every Sunday.  0  . cetirizine (ZYRTEC) 10 MG tablet Take 10 mg by mouth daily.    . cloNIDine (CATAPRES) 0.1 MG tablet Take 0.1 mg by mouth every evening.     . clopidogrel (PLAVIX) 75 MG tablet Take 75 mg by mouth daily.    . Coenzyme Q10 300 MG CAPS Take 300 mg by mouth 2 (two) times daily.    . Hydrocortisone (SFKCLEXNT-70 EX) Apply 1 application topically 2 (two) times daily as needed (for dry/itchy affected area(s) of skin).    Marland Kitchen LANTUS SOLOSTAR 100 UNIT/ML Solostar Pen Inject 20 Units into the skin at bedtime.  0  . levothyroxine (SYNTHROID, LEVOTHROID) 75 MCG tablet Take 75 mcg by mouth daily before breakfast.     . losartan-hydrochlorothiazide (HYZAAR) 100-12.5 MG per tablet Take 1 tablet by mouth daily. 90 tablet 3  . metoprolol (LOPRESSOR) 50 MG tablet  Take 1 tablet (50 mg total) by mouth 2 (two) times daily. 180 tablet 3  . Multiple Vitamin (MULTIVITAMIN WITH MINERALS) TABS tablet Take 1 tablet by mouth at bedtime.    Marland Kitchen NITROSTAT 0.4 MG SL tablet Take 0.4 mg by mouth every 5 (five) minutes as needed for chest pain.   0  . oxyCODONE (OXY IR/ROXICODONE) 5 MG immediate release tablet Take 1-2 tablets (5-10 mg total) by mouth every 6 (six)  hours as needed for severe pain. 15 tablet 0  . pantoprazole (PROTONIX) 40 MG tablet Take 1 tablet (40 mg total) by mouth daily before breakfast. 90 tablet 3  . potassium chloride SA (K-DUR,KLOR-CON) 20 MEQ tablet Take 20 mEq by mouth daily.  2  . Probiotic Product (PROBIOTIC PO) Take 1 tablet by mouth daily.    . sitaGLIPtin-metformin (JANUMET) 50-1000 MG per tablet Take 1 tablet by mouth 2 (two) times daily. Hold for 48 hours, restart on 06/05/2014 (Patient taking differently: Take 1 tablet by mouth 2 (two) times daily. )     No current facility-administered medications for this visit.     PHYSICAL EXAMINATION: ECOG PERFORMANCE STATUS: 1 - Symptomatic but completely ambulatory  Vitals:   01/01/17 1459  BP: (!) 125/49  Pulse: 92  Resp: 18  Temp: 98.4 F (36.9 C)   Filed Weights   01/01/17 1459  Weight: 156 lb 3.2 oz (70.9 kg)    GENERAL:alert, no distress and comfortable SKIN: skin color, texture, turgor are normal, no rashes or significant lesions EYES: normal, Conjunctiva are pink and non-injected, sclera clear OROPHARYNX:no exudate, no erythema and lips, buccal mucosa, and tongue normal  NECK: supple, thyroid normal size, non-tender, without nodularity LYMPH:  no palpable lymphadenopathy in the cervical, axillary or inguinal LUNGS: clear to auscultation and percussion with normal breathing effort HEART: regular rate & rhythm and no murmurs and no lower extremity edema ABDOMEN:abdomen soft, non-tender and normal bowel sounds MUSCULOSKELETAL:no cyanosis of digits and no clubbing  NEURO: alert & oriented x 3 with fluent speech, no focal motor/sensory deficits EXTREMITIES: No lower extremity edema  LABORATORY DATA:  I have reviewed the data as listed   Chemistry      Component Value Date/Time   NA 131 (L) 12/19/2016 0940   K 3.7 12/19/2016 0940   CL 97 (L) 12/19/2016 0940   CO2 26 12/19/2016 0940   BUN 9 12/19/2016 0940   CREATININE 0.54 12/19/2016 0940        Component Value Date/Time   CALCIUM 9.4 12/19/2016 0940   ALKPHOS 65 06/11/2014 1536   AST 18 06/11/2014 1536   ALT 15 06/11/2014 1536   BILITOT 0.2 (L) 06/11/2014 1536       Lab Results  Component Value Date   WBC 11.7 (H) 12/19/2016   HGB 10.2 (L) 12/19/2016   HCT 33.9 (L) 12/19/2016   MCV 71.1 (L) 12/19/2016   PLT 416 (H) 12/19/2016   NEUTROABS 7.9 (H) 11/13/2016    ASSESSMENT & PLAN:  Malignant neoplasm of upper-outer quadrant of left breast in female, estrogen receptor positive ([facility]) Left breast biopsy 10/01/2016: 12:30: IDC with DCIS involving underlying papillary lesion, grade 1, ER 100%, PR 90%, Ki-67 10%, HER-2 negative ratio 1.29; irregular 1.9 cm mass axilla negative, T1 CN 0 stage IA clinical stage Left lumpectomy 12/25/2016: IDC grade 1, 1.5 cm, low-grade DCIS, resection margins negative, 0/2 lymph nodes negative, ER 100%, PR 100%, HER-2 negative ratio 1.29, Ki-67 10% T1 cN0 stage IA  Pathology counseling: I discussed  the final pathology report of the patient provided  a copy of this report. I discussed the margins as well as lymph node surgeries. We also discussed the final staging along with previously performed ER/PR and HER-2/neu testing.  Recommendations: 1. Oncotype DX testing to determine if chemotherapy would be of any benefit followed by 2. Adjuvant radiation therapy followed by 3. Adjuvant antiestrogen therapy  Return to clinic based upon Oncotype DX test results Microcytic anemia: Hemoglobin 10.2 with an MCV of 71 I would consider doing iron studies when she comes back to see me.  I spent 25 minutes talking to the patient of which more than half was spent in counseling and coordination of care.  No orders of the defined types were placed in this encounter.  The patient has a good understanding of the overall plan. she agrees with it. she will call with any problems that may develop before the next visit here.   Rulon Eisenmenger, MD 01/01/17

## 2017-01-02 ENCOUNTER — Telehealth: Payer: Self-pay | Admitting: *Deleted

## 2017-01-02 NOTE — Telephone Encounter (Signed)
Ordered oncotype per Dr. Gudena.  Faxed requisition to pathology and confirmed receipt. 

## 2017-01-11 ENCOUNTER — Telehealth: Payer: Self-pay | Admitting: *Deleted

## 2017-01-11 DIAGNOSIS — Z170 Estrogen receptor positive status [ER+]: Secondary | ICD-10-CM | POA: Diagnosis not present

## 2017-01-11 DIAGNOSIS — C50412 Malignant neoplasm of upper-outer quadrant of left female breast: Secondary | ICD-10-CM | POA: Diagnosis not present

## 2017-01-11 NOTE — Telephone Encounter (Signed)
Received Oncotype Dx results of 2/4%.  Placed a copy on Dr. Geralyn Flash desk and Varney Biles desk.  Took a copy to HIM to scan.

## 2017-01-15 ENCOUNTER — Telehealth: Payer: Self-pay | Admitting: *Deleted

## 2017-01-15 NOTE — Progress Notes (Signed)
Location of Breast Cancer: Left Breast  Histology per Pathology Report:  10/01/16 Breast, left, needle core biopsy, 12:30 o'clock - INVASIVE DUCTAL CARCINOMA. - DUCTAL CARCINOMA IN SITU WITH CALCIFICATIONS.  Receptor Status: ER(100%), PR (100%), Her2-neu (NEG), Ki-(10%)  12/25/16 Diagnosis 1. Breast, lumpectomy, Left - INVASIVE DUCTAL CARCINOMA, GRADE 1, SPANNING 1.5 CM. - LOW GRADE DUCTAL CARCINOMA IN SITU. - RESECTION MARGINS ARE NEGATIVE FOR CARCINOMA. - SEE ONCOLOGY TABLE. 2. Lymph node, sentinel, biopsy, Left Axillary #1 - ONE OF ONE LYMPH NODE NEGATIVE FOR CARCINOMA (0/1). 3. Lymph node, sentinel, biopsy, Left Axillary #2 - ONE OF ONE LYMPH NODE NEGATIVE FOR CARCINOMA (0/1).   Did patient present with symptoms or was this found on screening mammography?: It was found on a screening mammogram.   Past/Anticipated interventions by surgeon, if any: 12/25/16 Procedure: Procedure(s): BREAST LUMPECTOMY WITH RADIOACTIVE SEED AND SENTINEL deep axillary  LYMPH NODE BIOPSY Surgeon: [name] T   Past/Anticipated interventions by medical oncology, if any:  01/11/17 Dr. Lindi Adie Recommendations: 1. Oncotype DX testing to determine if chemotherapy would be of any benefit followed by 2. Adjuvant radiation therapy followed by 3. Adjuvant antiestrogen therapy  Per note on 01/11/17 Oncotype Dx results: 2/4% She will not receive chemotherapy.    Lymphedema issues, if any: She has some mild swelling above her incision. She has good arm mobility.   Pain issues, if any:  She denies pain except Left axilla soreness when moving her arm.   SAFETY ISSUES:  Prior radiation? No  Pacemaker/ICD? No  Possible current pregnancy? No  Is the patient on methotrexate? No  Current Complaints / other details:   She is concerned about her past heart surgery and metal coils she has in her chest area from that surgery.   BP (!) 149/68   Pulse 85   Temp 98.5 F (36.9 C)   Ht 5' 2"   (1.575 m)   Wt 156 lb 12.8 oz (71.1 kg)   SpO2 99% Comment: room air  BMI 28.68 kg/m     Wt Readings from Last 3 Encounters:  01/16/17 156 lb 12.8 oz (71.1 kg)  01/01/17 156 lb 3.2 oz (70.9 kg)  12/25/16 158 lb (71.7 kg)

## 2017-01-15 NOTE — Telephone Encounter (Signed)
Spoke with patient about her oncotype score of 2.  She already has an appointment with Dr. Isidore Moos 6/6.  Dr. Lindi Adie will see her after XRT complete.

## 2017-01-16 ENCOUNTER — Encounter: Payer: Self-pay | Admitting: Radiation Oncology

## 2017-01-16 ENCOUNTER — Ambulatory Visit
Admission: RE | Admit: 2017-01-16 | Discharge: 2017-01-16 | Disposition: A | Discharge status: Home / Self Care | Payer: Medicare Other | Source: Ambulatory Visit | Attending: Radiation Oncology | Admitting: Radiation Oncology

## 2017-01-16 VITALS — BP 149/68 | HR 85 | Temp 98.5°F | Ht 62.0 in | Wt 156.8 lb

## 2017-01-16 DIAGNOSIS — Z170 Estrogen receptor positive status [ER+]: Secondary | ICD-10-CM | POA: Diagnosis not present

## 2017-01-16 DIAGNOSIS — D0512 Intraductal carcinoma in situ of left breast: Secondary | ICD-10-CM | POA: Diagnosis not present

## 2017-01-16 DIAGNOSIS — C50412 Malignant neoplasm of upper-outer quadrant of left female breast: Secondary | ICD-10-CM

## 2017-01-16 DIAGNOSIS — Z9889 Other specified postprocedural states: Secondary | ICD-10-CM | POA: Diagnosis not present

## 2017-01-16 NOTE — Progress Notes (Signed)
Radiation Oncology         [phone] ________________________________  Name: Susan Barnett MRN: [id number]  Date: 01/16/2017  DOB: May 26, 1946  Follow-Up Visit Note  Outpatient  CC: Shirline Frees, MD  [name], MD  Diagnosis:      ICD-10-CM   1. Malignant neoplasm of upper-outer quadrant of left breast in female, estrogen receptor positive ([facility]) C50.412    Z17.0     STAGE IA pathologic T1cN0M0 Left breast Invasive ductal carcinoma, ER+ PR+ Her2 neg, Grade I  CHIEF COMPLAINT: Here to discuss management of left breast cancer  Narrative:  The patient returns today for follow-up.     Since consultation, she underwent left lumpectomy 12/25/16, revealing: Grade I IDC, 1.5cm w/ DCIS.  2 SLN were negative. Prognostics: ER+ / PR+ HER2 negative. Margins negative by at least 67m to invasive and in situ disease.  She will not need chemotherapy per Oncotype DX test, low score.  Genetics eligible per tumor board discussion today.            ALLERGIES:  is allergic to asa [aspirin]; compazine [prochlorperazine edisylate]; nsaids; shellfish allergy; sulfa antibiotics; zantac [ranitidine hcl]; and lisinopril.  Meds: Current Outpatient Prescriptions  Medication Sig Dispense Refill  . amLODipine (NORVASC) 5 MG tablet Take 5 mg by mouth daily.     .Marland Kitchenatorvastatin (LIPITOR) 10 MG tablet Take 10 mg by mouth every evening.     . bisacodyl (DULCOLAX) 5 MG EC tablet Take 5 mg by mouth daily as needed for mild constipation.    .Marland KitchenBYDUREON 2 MG PEN Inject 2 mg into the skin every Sunday.  0  . cetirizine (ZYRTEC) 10 MG tablet Take 10 mg by mouth daily.    . cloNIDine (CATAPRES) 0.1 MG tablet Take 0.1 mg by mouth every evening.     . clopidogrel (PLAVIX) 75 MG tablet Take 75 mg by mouth daily.    . Coenzyme Q10 300 MG CAPS Take 300 mg by mouth 2 (two) times daily.    . Hydrocortisone ([id number]-84EX) Apply 1 application topically 2 (two) times daily as needed (for dry/itchy affected area(s)  of skin).    .Marland KitchenLANTUS SOLOSTAR 100 UNIT/ML Solostar Pen Inject 20 Units into the skin at bedtime.  0  . levothyroxine (SYNTHROID, LEVOTHROID) 75 MCG tablet Take 75 mcg by mouth daily before breakfast.     . losartan-hydrochlorothiazide (HYZAAR) 100-12.5 MG per tablet Take 1 tablet by mouth daily. 90 tablet 3  . metoprolol (LOPRESSOR) 50 MG tablet Take 1 tablet (50 mg total) by mouth 2 (two) times daily. 180 tablet 3  . Multiple Vitamin (MULTIVITAMIN WITH MINERALS) TABS tablet Take 1 tablet by mouth at bedtime.    . pantoprazole (PROTONIX) 40 MG tablet Take 1 tablet (40 mg total) by mouth daily before breakfast. 90 tablet 3  . potassium chloride SA (K-DUR,KLOR-CON) 20 MEQ tablet Take 20 mEq by mouth daily.  2  . Probiotic Product (PROBIOTIC PO) Take 1 tablet by mouth daily.    . sitaGLIPtin-metformin (JANUMET) 50-1000 MG per tablet Take 1 tablet by mouth 2 (two) times daily. Hold for 48 hours, restart on 06/05/2014 (Patient taking differently: Take 1 tablet by mouth 2 (two) times daily. )    . anastrozole (ARIMIDEX) 1 MG tablet Take 1 tablet (1 mg total) by mouth daily. (Patient not taking: Reported on 01/16/2017) 30 tablet 6  . NITROSTAT 0.4 MG SL tablet Take 0.4 mg by mouth every 5 (five) minutes as needed for  chest pain.   0  . oxyCODONE (OXY IR/ROXICODONE) 5 MG immediate release tablet Take 1-2 tablets (5-10 mg total) by mouth every 6 (six) hours as needed for severe pain. (Patient not taking: Reported on 01/16/2017) 15 tablet 0   No current facility-administered medications for this encounter.     Physical Findings:  height is 5' 2"  (1.575 m) and weight is 156 lb 12.8 oz (71.1 kg). Her temperature is 98.5 F (36.9 C). Her blood pressure is 149/68 (abnormal) and her pulse is 85. Her oxygen saturation is 99%. .     General: Alert and oriented, in no acute distress HEENT: Head is normocephalic.   Psychiatric: Judgment and insight are intact. Affect is appropriate. Breast exam reveals left  axillary and breast lumpectomy scars, healing well, with moderate seroma at lumpectomy site  Lab Findings: Lab Results  Component Value Date   WBC 11.7 (H) 12/19/2016   HGB 10.2 (L) 12/19/2016   HCT 33.9 (L) 12/19/2016   MCV 71.1 (L) 12/19/2016   PLT 416 (H) 12/19/2016     Radiographic Findings: Mm Breast Surgical Specimen  Result Date: 12/25/2016 CLINICAL DATA:  Patient status post left breast lumpectomy. EXAM: SPECIMEN RADIOGRAPH OF THE LEFT BREAST COMPARISON:  Previous exam(s). FINDINGS: Status post excision of the left breast. The radioactive seed and biopsy marker clip are present, completely intact, and were marked for pathology. IMPRESSION: Specimen radiograph of the left breast. Electronically Signed   By: Lovey Newcomer M.D.   On: 12/25/2016 08:25   Mm Lt Radioactive Seed Loc Mammo Guide  Result Date: 12/24/2016 CLINICAL DATA:  Biopsy proven invasive ductal carcinoma and ductal carcinoma in-situ. EXAM: MAMMOGRAPHIC GUIDED RADIOACTIVE SEED LOCALIZATION OF THE LEFT BREAST COMPARISON:  Previous exam(s). FINDINGS: Patient presents for radioactive seed localization prior to surgery. I met with the patient and we discussed the procedure of seed localization including benefits and alternatives. We discussed the high likelihood of a successful procedure. We discussed the risks of the procedure including infection, bleeding, tissue injury and further surgery. We discussed the low dose of radioactivity involved in the procedure. Informed, written consent was given. The usual time-out protocol was performed immediately prior to the procedure. Using mammographic guidance, sterile technique, 1% lidocaine and an I-125 radioactive seed, the ribbon shaped clip was localized using a superior to inferior approach. The follow-up mammogram images confirm the seed in the expected location and were marked for Dr. [name]. Follow-up survey of the patient confirms presence of the radioactive seed. Order number of  I-125 seed:  [id number]. Total activity:  4.097 millicuries  Reference Date: 12/07/2016 The patient tolerated the procedure well and was released from the [facility]. She was given instructions regarding seed removal. IMPRESSION: Radioactive seed localization left breast. No apparent complications. Electronically Signed   By: Lillia Mountain M.D.   On: 12/24/2016 13:51    Impression/Plan: Left breast cancer  We discussed adjuvant radiotherapy today.  I recommend 16 treatments of radiotherapy to the left breast in order to reduce risk of local recurrence. She understands this is optional and would not improve her life expectancy .  The risks, benefits and side effects of this treatment were discussed in detail.  She understands that radiotherapy is associated with skin irritation and fatigue in the acute setting. Late effects can include cosmetic changes and rare injury to internal organs.   She is enthusiastic about proceeding with treatment. A consent form has been signed and placed in her chart.  A total of 3  medically necessary complex treatment devices will be fabricated and supervised by me: 2 fields with MLCs for custom blocks to protect heart, and lungs;  and, a Vac-lok. MORE COMPLEX DEVICES MAY BE MADE IN DOSIMETRY FOR FIELD IN FIELD BEAMS FOR DOSE HOMOGENEITY.  I have requested : 3D Simulation which is medically necessary to give adequate dose to at risk tissues while sparing lungs and heart.  I have requested a DVH of the following structures: lungs, heart, and left lumpectomy cavity.    The patient will receive 42.56 Gy in 16 fractions to the left breast with 2 fields.  This will be not followed by a boost.  Anticipate simulation next week  Refer to genetics per tumor board recommedations; patient is interested to speak with counselor.  _____________________________________   Eppie Gibson, MD

## 2017-01-17 ENCOUNTER — Telehealth: Payer: Self-pay | Admitting: *Deleted

## 2017-01-17 ENCOUNTER — Telehealth: Payer: Self-pay | Admitting: Genetics

## 2017-01-17 ENCOUNTER — Encounter: Payer: Self-pay | Admitting: Genetics

## 2017-01-17 NOTE — Telephone Encounter (Signed)
CALLED PATIENT TO INFORM OF APPT. WITH GENETICS PERSON, ANNA VILLA ON 02-19-17 @ 1 PM, SPOKE WITH PATIENT AND SHE IS AWARE OF THIS APPT.

## 2017-01-17 NOTE — Telephone Encounter (Signed)
A genetic counseling appt has been scheduled for the pt to see Vicente Males on 7/10 at [facility] from [facility] will notify the pt. Letter mailed.

## 2017-01-18 NOTE — Addendum Note (Signed)
Addendum  created 01/18/17 1239 by Lyn Hollingshead, MD   Sign clinical note

## 2017-01-21 DIAGNOSIS — I25810 Atherosclerosis of coronary artery bypass graft(s) without angina pectoris: Secondary | ICD-10-CM | POA: Diagnosis not present

## 2017-01-21 DIAGNOSIS — E1142 Type 2 diabetes mellitus with diabetic polyneuropathy: Secondary | ICD-10-CM | POA: Diagnosis not present

## 2017-01-21 DIAGNOSIS — E1159 Type 2 diabetes mellitus with other circulatory complications: Secondary | ICD-10-CM | POA: Diagnosis not present

## 2017-01-21 DIAGNOSIS — I10 Essential (primary) hypertension: Secondary | ICD-10-CM | POA: Diagnosis not present

## 2017-01-21 DIAGNOSIS — D649 Anemia, unspecified: Secondary | ICD-10-CM | POA: Diagnosis not present

## 2017-01-21 DIAGNOSIS — N08 Glomerular disorders in diseases classified elsewhere: Secondary | ICD-10-CM | POA: Diagnosis not present

## 2017-01-22 ENCOUNTER — Encounter (HOSPITAL_COMMUNITY): Payer: Self-pay

## 2017-01-22 ENCOUNTER — Ambulatory Visit
Admission: RE | Admit: 2017-01-22 | Discharge: 2017-01-22 | Disposition: A | Discharge status: Home / Self Care | Payer: Medicare Other | Source: Ambulatory Visit | Attending: Radiation Oncology | Admitting: Radiation Oncology

## 2017-01-22 DIAGNOSIS — C50412 Malignant neoplasm of upper-outer quadrant of left female breast: Secondary | ICD-10-CM | POA: Diagnosis not present

## 2017-01-22 DIAGNOSIS — Z510 Encounter for antineoplastic radiation therapy: Secondary | ICD-10-CM | POA: Diagnosis present

## 2017-01-22 DIAGNOSIS — Z91013 Allergy to seafood: Secondary | ICD-10-CM | POA: Diagnosis not present

## 2017-01-22 DIAGNOSIS — Z794 Long term (current) use of insulin: Secondary | ICD-10-CM | POA: Insufficient documentation

## 2017-01-22 DIAGNOSIS — Z7902 Long term (current) use of antithrombotics/antiplatelets: Secondary | ICD-10-CM | POA: Insufficient documentation

## 2017-01-22 DIAGNOSIS — Z886 Allergy status to analgesic agent status: Secondary | ICD-10-CM | POA: Diagnosis not present

## 2017-01-22 DIAGNOSIS — Z79899 Other long term (current) drug therapy: Secondary | ICD-10-CM | POA: Diagnosis not present

## 2017-01-22 DIAGNOSIS — Z79811 Long term (current) use of aromatase inhibitors: Secondary | ICD-10-CM | POA: Diagnosis not present

## 2017-01-22 DIAGNOSIS — Z170 Estrogen receptor positive status [ER+]: Secondary | ICD-10-CM | POA: Diagnosis not present

## 2017-01-22 DIAGNOSIS — L814 Other melanin hyperpigmentation: Secondary | ICD-10-CM | POA: Insufficient documentation

## 2017-01-22 NOTE — Progress Notes (Signed)
Radiation Oncology         [phone] ________________________________  Name: Susan Barnett MRN: [id number]  Date: 01/22/2017  DOB: [birth year]  SIMULATION AND TREATMENT PLANNING NOTE / Special treatment procedure    Outpatient  DIAGNOSIS:     ICD-10-CM   1. Malignant neoplasm of upper-outer quadrant of left breast in female, estrogen receptor positive ([place]) C50.412    Z17.0     NARRATIVE:  The patient was brought to the [facility].  Identity was confirmed.  All relevant records and images related to the planned course of therapy were reviewed.  The patient freely provided informed written consent to proceed with treatment after reviewing the details related to the planned course of therapy. The consent form was witnessed and verified by the simulation staff.    Then, the patient was set-up in a stable reproducible supine position for radiation therapy with her ipsilateral arm over her head, and her upper body secured in a custom-made Vac-lok device.  CT images were obtained.  Surface markings were placed.  The CT images were loaded into the planning software.    Special treatment procedure:  Special treatment procedure was performed today due to the extra time and effort required by myself to plan and prepare this patient for deep inspiration breath hold technique.  I have determined cardiac sparing to be of benefit to this patient to prevent long term cardiac damage due to radiation of the heart.  Bellows were placed on the patient's abdomen. To facilitate cardiac sparing, the patient was coached by the radiation therapists on breath hold techniques and breathing practice was performed. Practice waveforms were obtained. The patient was then scanned while maintaining breath hold in the treatment position.  This image was then transferred over to the imaging specialist. The imaging specialist then created a fusion of the free breathing and breath hold scans using the chest  wall as the stable structure. I personally reviewed the fusion in axial, coronal and sagittal image planes.  Excellent cardiac sparing was obtained.  I felt the patient is an appropriate candidate for breath hold and the patient will be treated as such.  The image fusion was then reviewed with the patient to reinforce the necessity of reproducible breath hold.   TREATMENT PLANNING NOTE: Treatment planning then occurred.  The radiation prescription was entered and confirmed.     A total of 3 medically necessary complex treatment devices were fabricated and supervised by me: 2 fields with MLCs for custom blocks to protect heart, and lungs;  and, a Vac-lok. MORE COMPLEX DEVICES MAY BE MADE IN DOSIMETRY FOR FIELD IN FIELD BEAMS FOR DOSE HOMOGENEITY.  I have requested : 3D Simulation which is medically necessary to give adequate dose to at risk tissues while sparing lungs and heart.  I have requested a DVH of the following structures: lungs, heart, lumpectomy cavity.    The patient will receive 42.56 Gy in 16 fractions to the left breast with 2 tangential fields.  This will not be followed by a boost.  Optical Surface Tracking Plan:  Since intensity modulated radiotherapy (IMRT) and 3D conformal radiation treatment methods are predicated on accurate and precise positioning for treatment, intrafraction motion monitoring is medically necessary to ensure accurate and safe treatment delivery. The ability to quantify intrafraction motion without excessive ionizing radiation dose can only be performed with optical surface tracking. Accordingly, surface imaging offers the opportunity to obtain 3D measurements of patient position throughout IMRT and 3D treatments  without excessive radiation exposure. I am ordering optical surface tracking for this patient's upcoming course of radiotherapy.  ________________________________   Reference:  Ursula Alert, J, et al. Surface imaging-based analysis of  intrafraction motion for breast radiotherapy patients.Journal of [place], n. 6, nov. 2014. ISSN [id number].  Available at: <http://www.jacmp.org/index.php/jacmp/article/view/4957>.    -----------------------------------  Eppie Gibson, MD

## 2017-01-23 ENCOUNTER — Ambulatory Visit: Payer: Medicare Other | Admitting: Internal Medicine

## 2017-01-25 DIAGNOSIS — C50412 Malignant neoplasm of upper-outer quadrant of left female breast: Secondary | ICD-10-CM | POA: Diagnosis not present

## 2017-01-25 DIAGNOSIS — Z510 Encounter for antineoplastic radiation therapy: Secondary | ICD-10-CM | POA: Diagnosis not present

## 2017-01-25 HISTORY — PX: BREAST LUMPECTOMY: SHX2

## 2017-01-29 ENCOUNTER — Ambulatory Visit
Admission: RE | Admit: 2017-01-29 | Discharge: 2017-01-29 | Disposition: A | Discharge status: Home / Self Care | Payer: Medicare Other | Source: Ambulatory Visit | Attending: Radiation Oncology | Admitting: Radiation Oncology

## 2017-01-29 DIAGNOSIS — Z510 Encounter for antineoplastic radiation therapy: Secondary | ICD-10-CM | POA: Diagnosis not present

## 2017-01-29 DIAGNOSIS — C50412 Malignant neoplasm of upper-outer quadrant of left female breast: Secondary | ICD-10-CM | POA: Diagnosis not present

## 2017-01-30 ENCOUNTER — Ambulatory Visit
Admission: RE | Admit: 2017-01-30 | Discharge: 2017-01-30 | Disposition: A | Discharge status: Home / Self Care | Payer: Medicare Other | Source: Ambulatory Visit | Attending: Radiation Oncology | Admitting: Radiation Oncology

## 2017-01-30 DIAGNOSIS — Z510 Encounter for antineoplastic radiation therapy: Secondary | ICD-10-CM | POA: Diagnosis not present

## 2017-01-30 DIAGNOSIS — C50412 Malignant neoplasm of upper-outer quadrant of left female breast: Secondary | ICD-10-CM | POA: Diagnosis not present

## 2017-01-31 ENCOUNTER — Encounter: Payer: Self-pay | Admitting: Internal Medicine

## 2017-01-31 ENCOUNTER — Ambulatory Visit
Admission: RE | Admit: 2017-01-31 | Discharge: 2017-01-31 | Disposition: A | Discharge status: Home / Self Care | Payer: Medicare Other | Source: Ambulatory Visit | Attending: Radiation Oncology | Admitting: Radiation Oncology

## 2017-01-31 DIAGNOSIS — C50412 Malignant neoplasm of upper-outer quadrant of left female breast: Secondary | ICD-10-CM | POA: Diagnosis not present

## 2017-01-31 DIAGNOSIS — Z510 Encounter for antineoplastic radiation therapy: Secondary | ICD-10-CM | POA: Diagnosis not present

## 2017-01-31 DIAGNOSIS — Z170 Estrogen receptor positive status [ER+]: Principal | ICD-10-CM

## 2017-01-31 MED ORDER — ALRA NON-METALLIC DEODORANT (RAD-ONC)
1.0000 "application " | Freq: Once | TOPICAL | Status: AC
  Administered 2017-01-31: 1 via TOPICAL

## 2017-01-31 MED ORDER — RADIAPLEXRX EX GEL
Freq: Once | CUTANEOUS | Status: AC
  Administered 2017-01-31: 15:00:00 via TOPICAL

## 2017-01-31 NOTE — Progress Notes (Signed)

## 2017-02-01 ENCOUNTER — Ambulatory Visit
Admission: RE | Admit: 2017-02-01 | Discharge: 2017-02-01 | Disposition: A | Discharge status: Home / Self Care | Payer: Medicare Other | Source: Ambulatory Visit | Attending: Radiation Oncology | Admitting: Radiation Oncology

## 2017-02-01 DIAGNOSIS — B372 Candidiasis of skin and nail: Secondary | ICD-10-CM | POA: Diagnosis not present

## 2017-02-01 DIAGNOSIS — C50412 Malignant neoplasm of upper-outer quadrant of left female breast: Secondary | ICD-10-CM | POA: Diagnosis not present

## 2017-02-01 DIAGNOSIS — Z510 Encounter for antineoplastic radiation therapy: Secondary | ICD-10-CM | POA: Diagnosis not present

## 2017-02-04 ENCOUNTER — Ambulatory Visit
Admission: RE | Admit: 2017-02-04 | Discharge: 2017-02-04 | Disposition: A | Discharge status: Home / Self Care | Payer: Medicare Other | Source: Ambulatory Visit | Attending: Radiation Oncology | Admitting: Radiation Oncology

## 2017-02-04 DIAGNOSIS — C50412 Malignant neoplasm of upper-outer quadrant of left female breast: Secondary | ICD-10-CM | POA: Diagnosis not present

## 2017-02-04 DIAGNOSIS — Z510 Encounter for antineoplastic radiation therapy: Secondary | ICD-10-CM | POA: Diagnosis not present

## 2017-02-05 ENCOUNTER — Ambulatory Visit
Admission: RE | Admit: 2017-02-05 | Discharge: 2017-02-05 | Disposition: A | Discharge status: Home / Self Care | Payer: Medicare Other | Source: Ambulatory Visit | Attending: Radiation Oncology | Admitting: Radiation Oncology

## 2017-02-05 DIAGNOSIS — Z510 Encounter for antineoplastic radiation therapy: Secondary | ICD-10-CM | POA: Diagnosis not present

## 2017-02-05 DIAGNOSIS — C50412 Malignant neoplasm of upper-outer quadrant of left female breast: Secondary | ICD-10-CM | POA: Diagnosis not present

## 2017-02-06 ENCOUNTER — Ambulatory Visit
Admission: RE | Admit: 2017-02-06 | Discharge: 2017-02-06 | Disposition: A | Discharge status: Home / Self Care | Payer: Medicare Other | Source: Ambulatory Visit | Attending: Radiation Oncology | Admitting: Radiation Oncology

## 2017-02-06 DIAGNOSIS — Z510 Encounter for antineoplastic radiation therapy: Secondary | ICD-10-CM | POA: Diagnosis not present

## 2017-02-06 DIAGNOSIS — C50412 Malignant neoplasm of upper-outer quadrant of left female breast: Secondary | ICD-10-CM | POA: Diagnosis not present

## 2017-02-07 ENCOUNTER — Ambulatory Visit
Admission: RE | Admit: 2017-02-07 | Discharge: 2017-02-07 | Disposition: A | Discharge status: Home / Self Care | Payer: Medicare Other | Source: Ambulatory Visit | Attending: Radiation Oncology | Admitting: Radiation Oncology

## 2017-02-07 DIAGNOSIS — C50412 Malignant neoplasm of upper-outer quadrant of left female breast: Secondary | ICD-10-CM | POA: Diagnosis not present

## 2017-02-07 DIAGNOSIS — Z510 Encounter for antineoplastic radiation therapy: Secondary | ICD-10-CM | POA: Diagnosis not present

## 2017-02-08 ENCOUNTER — Ambulatory Visit
Admission: RE | Admit: 2017-02-08 | Discharge: 2017-02-08 | Disposition: A | Discharge status: Home / Self Care | Payer: Medicare Other | Source: Ambulatory Visit | Attending: Radiation Oncology | Admitting: Radiation Oncology

## 2017-02-08 DIAGNOSIS — C50412 Malignant neoplasm of upper-outer quadrant of left female breast: Secondary | ICD-10-CM | POA: Diagnosis not present

## 2017-02-08 DIAGNOSIS — Z510 Encounter for antineoplastic radiation therapy: Secondary | ICD-10-CM | POA: Diagnosis not present

## 2017-02-11 ENCOUNTER — Ambulatory Visit
Admission: RE | Admit: 2017-02-11 | Discharge: 2017-02-11 | Disposition: A | Discharge status: Home / Self Care | Payer: Medicare Other | Source: Ambulatory Visit | Attending: Radiation Oncology | Admitting: Radiation Oncology

## 2017-02-11 DIAGNOSIS — Z510 Encounter for antineoplastic radiation therapy: Secondary | ICD-10-CM | POA: Diagnosis not present

## 2017-02-11 DIAGNOSIS — C50412 Malignant neoplasm of upper-outer quadrant of left female breast: Secondary | ICD-10-CM | POA: Diagnosis not present

## 2017-02-12 ENCOUNTER — Ambulatory Visit
Admission: RE | Admit: 2017-02-12 | Discharge: 2017-02-12 | Disposition: A | Discharge status: Home / Self Care | Payer: Medicare Other | Source: Ambulatory Visit | Attending: Radiation Oncology | Admitting: Radiation Oncology

## 2017-02-12 DIAGNOSIS — Z510 Encounter for antineoplastic radiation therapy: Secondary | ICD-10-CM | POA: Diagnosis not present

## 2017-02-12 DIAGNOSIS — C50412 Malignant neoplasm of upper-outer quadrant of left female breast: Secondary | ICD-10-CM | POA: Diagnosis not present

## 2017-02-14 ENCOUNTER — Ambulatory Visit
Admission: RE | Admit: 2017-02-14 | Discharge: 2017-02-14 | Disposition: A | Discharge status: Home / Self Care | Payer: Medicare Other | Source: Ambulatory Visit | Attending: Radiation Oncology | Admitting: Radiation Oncology

## 2017-02-14 DIAGNOSIS — C50412 Malignant neoplasm of upper-outer quadrant of left female breast: Secondary | ICD-10-CM | POA: Diagnosis not present

## 2017-02-14 DIAGNOSIS — M1712 Unilateral primary osteoarthritis, left knee: Secondary | ICD-10-CM | POA: Diagnosis not present

## 2017-02-14 DIAGNOSIS — Z510 Encounter for antineoplastic radiation therapy: Secondary | ICD-10-CM | POA: Diagnosis not present

## 2017-02-15 ENCOUNTER — Ambulatory Visit
Admission: RE | Admit: 2017-02-15 | Discharge: 2017-02-15 | Disposition: A | Discharge status: Home / Self Care | Payer: Medicare Other | Source: Ambulatory Visit | Attending: Radiation Oncology | Admitting: Radiation Oncology

## 2017-02-15 DIAGNOSIS — Z510 Encounter for antineoplastic radiation therapy: Secondary | ICD-10-CM | POA: Diagnosis not present

## 2017-02-15 DIAGNOSIS — C50412 Malignant neoplasm of upper-outer quadrant of left female breast: Secondary | ICD-10-CM | POA: Diagnosis not present

## 2017-02-18 ENCOUNTER — Other Ambulatory Visit: Payer: Self-pay

## 2017-02-18 ENCOUNTER — Ambulatory Visit
Admission: RE | Admit: 2017-02-18 | Discharge: 2017-02-18 | Disposition: A | Discharge status: Home / Self Care | Payer: Medicare Other | Source: Ambulatory Visit | Attending: Radiation Oncology | Admitting: Radiation Oncology

## 2017-02-18 DIAGNOSIS — C50412 Malignant neoplasm of upper-outer quadrant of left female breast: Secondary | ICD-10-CM | POA: Diagnosis not present

## 2017-02-18 DIAGNOSIS — Z170 Estrogen receptor positive status [ER+]: Principal | ICD-10-CM

## 2017-02-18 DIAGNOSIS — Z510 Encounter for antineoplastic radiation therapy: Secondary | ICD-10-CM | POA: Diagnosis not present

## 2017-02-18 MED ORDER — RADIAPLEXRX EX GEL
Freq: Once | CUTANEOUS | Status: AC
  Administered 2017-02-18: 10:00:00 via TOPICAL

## 2017-02-19 ENCOUNTER — Other Ambulatory Visit (HOSPITAL_BASED_OUTPATIENT_CLINIC_OR_DEPARTMENT_OTHER): Payer: Medicare Other

## 2017-02-19 ENCOUNTER — Ambulatory Visit
Admission: RE | Admit: 2017-02-19 | Discharge: 2017-02-19 | Disposition: A | Discharge status: Home / Self Care | Payer: Medicare Other | Source: Ambulatory Visit | Attending: Radiation Oncology | Admitting: Radiation Oncology

## 2017-02-19 ENCOUNTER — Encounter: Payer: Self-pay | Admitting: Genetics

## 2017-02-19 ENCOUNTER — Ambulatory Visit (HOSPITAL_BASED_OUTPATIENT_CLINIC_OR_DEPARTMENT_OTHER): Payer: Medicare Other | Admitting: Genetics

## 2017-02-19 ENCOUNTER — Other Ambulatory Visit (HOSPITAL_COMMUNITY)
Admission: RE | Admit: 2017-02-19 | Discharge: 2017-02-19 | Disposition: A | Discharge status: Home / Self Care | Payer: Medicare Other | Source: Other Acute Inpatient Hospital | Attending: Hematology and Oncology | Admitting: Hematology and Oncology

## 2017-02-19 DIAGNOSIS — Z8579 Personal history of other malignant neoplasms of lymphoid, hematopoietic and related tissues: Secondary | ICD-10-CM | POA: Diagnosis not present

## 2017-02-19 DIAGNOSIS — C50412 Malignant neoplasm of upper-outer quadrant of left female breast: Secondary | ICD-10-CM

## 2017-02-19 DIAGNOSIS — Z803 Family history of malignant neoplasm of breast: Secondary | ICD-10-CM | POA: Diagnosis not present

## 2017-02-19 DIAGNOSIS — Z801 Family history of malignant neoplasm of trachea, bronchus and lung: Secondary | ICD-10-CM | POA: Diagnosis not present

## 2017-02-19 DIAGNOSIS — Z170 Estrogen receptor positive status [ER+]: Principal | ICD-10-CM

## 2017-02-19 DIAGNOSIS — C50912 Malignant neoplasm of unspecified site of left female breast: Secondary | ICD-10-CM

## 2017-02-19 DIAGNOSIS — Z510 Encounter for antineoplastic radiation therapy: Secondary | ICD-10-CM | POA: Diagnosis not present

## 2017-02-19 LAB — CBC WITH DIFFERENTIAL/PLATELET
BASO%: 0.4 % (ref 0.0–2.0)
Basophils Absolute: 0 10*3/uL (ref 0.0–0.1)
EOS%: 7.6 % — ABNORMAL HIGH (ref 0.0–7.0)
Eosinophils Absolute: 0.7 10*3/uL — ABNORMAL HIGH (ref 0.0–0.5)
HCT: 32.5 % — ABNORMAL LOW (ref 34.8–46.6)
HGB: 9.9 g/dL — ABNORMAL LOW (ref 11.6–15.9)
LYMPH%: 11.1 % — ABNORMAL LOW (ref 14.0–49.7)
MCH: 21.9 pg — ABNORMAL LOW (ref 25.1–34.0)
MCHC: 30.5 g/dL — ABNORMAL LOW (ref 31.5–36.0)
MCV: 71.9 fL — ABNORMAL LOW (ref 79.5–101.0)
MONO#: 0.7 10*3/uL (ref 0.1–0.9)
MONO%: 7.4 % (ref 0.0–14.0)
NEUT#: 7.1 10*3/uL — ABNORMAL HIGH (ref 1.5–6.5)
NEUT%: 73.5 % (ref 38.4–76.8)
Platelets: 367 10*3/uL (ref 145–400)
RBC: 4.52 10*6/uL (ref 3.70–5.45)
RDW: 17.9 % — ABNORMAL HIGH (ref 11.2–14.5)
WBC: 9.6 10*3/uL (ref 3.9–10.3)
lymph#: 1.1 10*3/uL (ref 0.9–3.3)

## 2017-02-19 LAB — COMPREHENSIVE METABOLIC PANEL
ALT: 14 U/L (ref 14–54)
AST: 15 U/L (ref 15–41)
Albumin: 3.9 g/dL (ref 3.5–5.0)
Alkaline Phosphatase: 52 U/L (ref 38–126)
Anion gap: 10 (ref 5–15)
BUN: 13 mg/dL (ref 6–20)
CO2: 25 mmol/L (ref 22–32)
Calcium: 9.3 mg/dL (ref 8.9–10.3)
Chloride: 94 mmol/L — ABNORMAL LOW (ref 101–111)
Creatinine, Ser: 0.59 mg/dL (ref 0.44–1.00)
GFR calc Af Amer: >60 mL/min (ref >60)
GFR calc non Af Amer: >60 mL/min (ref >60)
Glucose, Bld: 137 mg/dL — ABNORMAL HIGH (ref 65–99)
Potassium: 3.8 mmol/L (ref 3.5–5.1)
Sodium: 129 mmol/L — ABNORMAL LOW (ref 135–145)
Total Bilirubin: 0.4 mg/dL (ref 0.3–1.2)
Total Protein: 7.4 g/dL (ref 6.5–8.1)

## 2017-02-19 NOTE — Progress Notes (Signed)
REFERRING PROVIDER: Eppie Gibson, MD [address], [place] [address]  PRIMARY PROVIDER:  Shirline Frees, MD  PRIMARY REASON FOR VISIT:  1. Malignant neoplasm of left breast in female, estrogen receptor positive, unspecified site of breast ([place])   2. Family history of breast cancer     HISTORY OF PRESENT ILLNESS:   Ms. Giannotti, a 71 y.o. female, was seen for a [facility] cancer genetics consultation at the request of Dr. Isidore Moos due to a personal and family history of cancer.  Ms. Banghart presents to clinic today to discuss the possibility of a hereditary predisposition to cancer, genetic testing, and to further clarify her future cancer risks, as well as potential cancer risks for family members.   CANCER HISTORY: In May 2018, at the age of 54, Ms. Hucker was diagnosed with ER/PR+ HER2- invasive ductal carcinoma of the left breast. This was treated with lumpectomy and radiation.     Malignant neoplasm of upper-outer quadrant of left breast in female, estrogen receptor positive ([place])   10/01/2016 Initial Diagnosis    Left breast biopsy: 12:30: IDC with DCIS involving underlying papillary lesion, grade 1, ER 100%, PR 90%, Ki-67 10%, HER-2 negative ratio 1.29; irregular 1.9 cm mass axilla negative, T1 CN 0 stage IA clinical stage      12/25/2016 Surgery    Left lumpectomy: IDC grade 1, 1.5 cm, low-grade DCIS, resection margins negative, 0/2 lymph nodes negative, ER 100%, PR 100%, HER-2 negative ratio 1.29, Ki-67 10% T1 cN0 stage IA       Past Medical History:  Diagnosis Date  . Anemia    mild  . Anginal pain ([place])    with exertion, last time prior to cardiac caht 06/02/14  . Anxiety    situational  . Arthritis   . CAD (coronary artery disease)    Dr. Linard Millers follows, no problems now  . Complication of anesthesia   . Diabetes mellitus, type 2 ([place])    TYPE 2  . Dysrhythmia    occ skips a beat, rate was fast before beta blocker.  . Gastric polyp   . GERD  (gastroesophageal reflux disease)   . Headache    hx of  . Heartburn   . Hiatal hernia    mild head tremors  . Hypertension   . Hypothyroidism   . Occasional tremors    "of head", slight hands  . Pneumonia   . PONV (postoperative nausea and vomiting) 02/24/14  . Shortness of breath    with exertion  . Sleep apnea    cpap used nightly x 2 yrs now.    Past Surgical History:  Procedure Laterality Date  . BREAST BIOPSY Left 1987  . BREAST LUMPECTOMY WITH RADIOACTIVE SEED AND SENTINEL LYMPH NODE BIOPSY Left 12/25/2016   Procedure: BREAST LUMPECTOMY WITH RADIOACTIVE SEED AND SENTINEL LYMPH NODE BIOPSY;  Surgeon: [name], MD;  Location: [facility];  Service: General;  Laterality: Left;  . CARDIAC CATHETERIZATION  06/02/2014   LAD 30%, D1 80%, CFX 755, RCA > 95% ISR, rx w/ PTCA, heavy calcification, EF 65%  . CORONARY ARTERY BYPASS GRAFT N/A 06/14/2014   Procedure: CORONARY ARTERY BYPASS GRAFTING (CABG);  Surgeon: Gaye Pollack, MD;  Location: [facility];  Service: Open Heart Surgery;  Laterality: N/A;  Times 2 using endoscopically harvested right saphenous vein.  . coronary stents     x3 stents placed-prior ablation  . DILATION AND CURETTAGE OF UTERUS    . ELBOW FRACTURE SURGERY Left   .  INCONTINENCE SURGERY     sling  . KNEE ARTHROSCOPY Right 02/24/2014   Procedure: RIGHT KNEE ARTHROSCOPY WITH DEBRIDEMENT ;  Surgeon: Gearlean Alf, MD;  Location: [facility];  Service: Orthopedics;  Laterality: Right;  . LEFT HEART CATHETERIZATION WITH CORONARY ANGIOGRAM N/A 06/02/2014   Procedure: LEFT HEART CATHETERIZATION WITH CORONARY ANGIOGRAM;  Surgeon: Sinclair Grooms, MD;  [facility];  Service: Cardiovascular;  Laterality: N/A;  . TEE WITHOUT CARDIOVERSION N/A 06/14/2014   Procedure: TRANSESOPHAGEAL ECHOCARDIOGRAM (TEE);  Surgeon: Gaye Pollack, MD;  Location: [facility];  Service: Open Heart Surgery;  Laterality: N/A;    Social History   Social History  . Marital status: Married     Spouse name: N/A  . Number of children: N/A  . Years of education: N/A   Social History Main Topics  . Smoking status: Never Smoker  . Smokeless tobacco: Never Used  . Alcohol use No  . Drug use: No  . Sexual activity: Not Currently   Other Topics Concern  . Not on file   Social History Narrative  . No narrative on file     FAMILY HISTORY:  We obtained a detailed, 4-generation family history.  Significant diagnoses are listed below: Family History  Problem Relation Age of Onset  . Heart attack Mother   . Hypertension Mother   . Diabetes Mother   . Lung cancer Brother        d.57 from metastatic disease. History of smoking.  Marland Kitchen Healthy Brother   . Breast cancer Cousin 30       d.30s  . Hodgkin's lymphoma Maternal Uncle        d.62s   Ms. Cerino has a son, age 26, and daughter, age 68, without cancers. Ms. Borys has two brothers. One brother is 52 without cancers. The other brother died at 61 from lung cancer that had metastasized to his liver at the time of diagnosis. He had a history of smoking.   Ms. Mcmenamin mother died at 5 without cancers. Ms. Mcchesney had four maternal uncles and two maternal aunts. One uncle died with Hodgkin's lymphoma in his early-60s. The rest of her maternal relatives are not known to have cancer. Her maternal grandmother died at 70 and her maternal grandfather died in his 25s.  Ms. Fickel father died at 50 following surgery for a lung injury he obtained while in the [place] that involved loss of oxygen. There is limited information about Ms. Briel's paternal family history. Ms. Isaac reports that she had 3 or 4 paternal aunts and one paternal uncle. She would not know if any had cancer. One paternal first-cousin died from breast cancer in her 13s. Ms. Kochan paternal grandparents both died of unknown cause before her parents met and she does not know anything else about their health.  Ms. Teem is unaware of previous family history of  genetic testing for hereditary cancer risks. Patient's maternal ancestors are of [place] descent, and paternal ancestors are of [place] descent. There is no reported Ashkenazi Jewish ancestry. There is no known consanguinity.  GENETIC COUNSELING ASSESSMENT: TREY BEBEE is a 71 y.o. female with a personal and family history which is somewhat suggestive of a hereditary cancer syndrome and predisposition to cancer. We, therefore, discussed and recommended the following at today's visit.   DISCUSSION: We reviewed the characteristics, features and inheritance patterns of hereditary cancer syndromes. We also discussed genetic testing, including the appropriate family members to test, the process of  testing, insurance coverage and turn-around-time for results. We discussed the implications of a negative, positive and/or variant of uncertain significant result. We recommended Ms. Kluck pursue genetic testing for the Common Hereditary Cancer Panel offered by [facility]. [facility]'s Common Hereditary Cancers Panel includes analysis of the following 46 genes: APC, ATM, AXIN2, BARD1, BMPR1A, BRCA1, BRCA2, BRIP1, CDH1, CDKN2A, CHEK2, CTNNA1, DICER1, EPCAM, GREM1, HOXB13, KIT, MEN1, MLH1, MSH2, MSH3, MSH6, MUTYH, NBN, NF1, NTHL1, PALB2, PDGFRA, PMS2, POLD1, POLE, PTEN, RAD50, RAD51C, RAD51D, SDHA, SDHB, SDHC, SDHD, SMAD4, SMARCA4, STK11, TP53, TSC1, TSC2, and VHL.  Based on Ms. Ledwith's personal and family history of cancer, she meets medical criteria for genetic testing. Despite that she meets criteria, she may still have an out of pocket cost. We discussed that if her out of pocket cost for testing is over $100, the laboratory will call and confirm whether she wants to proceed with testing.  If the out of pocket cost of testing is less than $100 she will be billed by the genetic testing laboratory. Because Ms. Fryberger expressed concern about cost, we have asked [facility] to contact her regarding ANY  out-of-pocket expense.  PLAN: After considering the risks, benefits, and limitations, Ms. Crislip  provided informed consent to pursue genetic testing and the blood sample was sent to [facility] for analysis of the 46-gene Common Hereditary Cancers Panel. Results should be available within approximately 3 weeks' time, at which point they will be disclosed by telephone to Ms. Apgar, as will any additional recommendations warranted by these results. This information will also be available in Epic.   Lastly, we encouraged Ms. Vetere to remain in contact with cancer genetics annually so that we can continuously update the family history and inform her of any changes in cancer genetics and testing that may be of benefit for this family.   Ms.  Dirden questions were answered to her satisfaction today. Our contact information was provided should additional questions or concerns arise. Thank you for the referral and allowing [place] to share in the care of your patient.   Mal Misty, MS, [facility] Certified Naval [email or web address] phone: [phone]  The patient was seen for a total of 40 minutes in face-to-face genetic counseling.  This patient was discussed with Drs. Magrinat, Lindi Adie and/or Burr Medico who agrees with the above.    _______________________________________________________________________ For Office Staff:  Number of people involved in session: 1 Was an Intern/ student involved with case: no

## 2017-02-20 ENCOUNTER — Ambulatory Visit
Admission: RE | Admit: 2017-02-20 | Discharge: 2017-02-20 | Disposition: A | Discharge status: Home / Self Care | Payer: Medicare Other | Source: Ambulatory Visit | Attending: Radiation Oncology | Admitting: Radiation Oncology

## 2017-02-20 DIAGNOSIS — C50412 Malignant neoplasm of upper-outer quadrant of left female breast: Secondary | ICD-10-CM | POA: Diagnosis not present

## 2017-02-20 DIAGNOSIS — Z510 Encounter for antineoplastic radiation therapy: Secondary | ICD-10-CM | POA: Diagnosis not present

## 2017-02-21 ENCOUNTER — Encounter: Payer: Self-pay | Admitting: Hematology and Oncology

## 2017-02-21 ENCOUNTER — Ambulatory Visit (HOSPITAL_BASED_OUTPATIENT_CLINIC_OR_DEPARTMENT_OTHER): Payer: Medicare Other | Admitting: Hematology and Oncology

## 2017-02-21 ENCOUNTER — Encounter: Payer: Self-pay | Admitting: *Deleted

## 2017-02-21 ENCOUNTER — Ambulatory Visit
Admission: RE | Admit: 2017-02-21 | Discharge: 2017-02-21 | Disposition: A | Discharge status: Home / Self Care | Payer: Medicare Other | Source: Ambulatory Visit | Attending: Radiation Oncology | Admitting: Radiation Oncology

## 2017-02-21 DIAGNOSIS — L598 Other specified disorders of the skin and subcutaneous tissue related to radiation: Secondary | ICD-10-CM

## 2017-02-21 DIAGNOSIS — Z170 Estrogen receptor positive status [ER+]: Secondary | ICD-10-CM

## 2017-02-21 DIAGNOSIS — M1712 Unilateral primary osteoarthritis, left knee: Secondary | ICD-10-CM | POA: Diagnosis not present

## 2017-02-21 DIAGNOSIS — C50412 Malignant neoplasm of upper-outer quadrant of left female breast: Secondary | ICD-10-CM | POA: Diagnosis not present

## 2017-02-21 DIAGNOSIS — Z510 Encounter for antineoplastic radiation therapy: Secondary | ICD-10-CM | POA: Diagnosis not present

## 2017-02-21 MED ORDER — ANASTROZOLE 1 MG PO TABS: 1.0000 mg | ORAL_TABLET | Freq: Every day | ORAL | 3 refills | Status: DC

## 2017-02-21 MED ORDER — POLYSACCHARIDE IRON COMPLEX 150 MG PO CAPS: 150.0000 mg | ORAL_CAPSULE | Freq: Every day | ORAL | Status: DC

## 2017-02-21 NOTE — Progress Notes (Signed)
Patient Care Team: Shirline Frees, MD as PCP - General (Family Medicine)  DIAGNOSIS:  Encounter Diagnosis  Name Primary?  . Malignant neoplasm of upper-outer quadrant of left breast in female, estrogen receptor positive ([place])     SUMMARY OF ONCOLOGIC HISTORY:   Malignant neoplasm of upper-outer quadrant of left breast in female, estrogen receptor positive ([place])   10/01/2016 Initial Diagnosis    Left breast biopsy: 12:30: IDC with DCIS involving underlying papillary lesion, grade 1, ER 100%, PR 90%, Ki-67 10%, HER-2 negative ratio 1.29; irregular 1.9 cm mass axilla negative, T1 CN 0 stage IA clinical stage      12/25/2016 Surgery    Left lumpectomy: IDC grade 1, 1.5 cm, low-grade DCIS, resection margins negative, 0/2 lymph nodes negative, ER 100%, PR 100%, HER-2 negative ratio 1.29, Ki-67 10% T1 cN0 stage IA      12/25/2016 Oncotype testing    Oncotype DX score 2: Risk of recurrence 4%      01/30/2017 - 02/21/2017 Radiation Therapy    Adjuvant radiation therapy       CHIEF COMPLIANT: Follow-up after completion of radiation therapy  INTERVAL HISTORY: BENNY HENRIE is a 71 year old with above-mentioned history left breast cancer treated with lumpectomy and today completes her radiation therapy. She is here today to discuss starting antiestrogen therapy with anastrozole. She does have radiation dermatitis.  REVIEW OF SYSTEMS:   Constitutional: Denies fevers, chills or abnormal weight loss Eyes: Denies blurriness of vision Ears, nose, mouth, throat, and face: Denies mucositis or sore throat Respiratory: Denies cough, dyspnea or wheezes Cardiovascular: Denies palpitation, chest discomfort Gastrointestinal:  Denies nausea, heartburn or change in bowel habits Skin: Denies abnormal skin rashes Lymphatics: Denies new lymphadenopathy or easy bruising Neurological:Denies numbness, tingling or new weaknesses Behavioral/Psych: Mood is stable, no new changes  Extremities: No lower  extremity edema Breast:   Mild radiation dermatitis All other systems were reviewed with the patient and are negative.  I have reviewed the past medical history, past surgical history, social history and family history with the patient and they are unchanged from previous note.  ALLERGIES:  is allergic to asa [aspirin]; compazine [prochlorperazine edisylate]; nsaids; shellfish allergy; sulfa antibiotics; zantac [ranitidine hcl]; and lisinopril.  MEDICATIONS:  Current Outpatient Prescriptions  Medication Sig Dispense Refill  . amLODipine (NORVASC) 5 MG tablet Take 5 mg by mouth daily.     Marland Kitchen anastrozole (ARIMIDEX) 1 MG tablet Take 1 tablet (1 mg total) by mouth daily. 90 tablet 3  . atorvastatin (LIPITOR) 10 MG tablet Take 10 mg by mouth every evening.     . bisacodyl (DULCOLAX) 5 MG EC tablet Take 5 mg by mouth daily as needed for mild constipation.    . cetirizine (ZYRTEC) 10 MG tablet Take 10 mg by mouth daily.    . cloNIDine (CATAPRES) 0.1 MG tablet Take 0.1 mg by mouth every evening.     . clopidogrel (PLAVIX) 75 MG tablet Take 75 mg by mouth daily.    . Coenzyme Q10 300 MG CAPS Take 300 mg by mouth 2 (two) times daily.    . Hydrocortisone (YIAXKPVVZ-48 EX) Apply 1 application topically 2 (two) times daily as needed (for dry/itchy affected area(s) of skin).    . iron polysaccharides (NIFEREX) 150 MG capsule Take 1 capsule (150 mg total) by mouth daily.    Marland Kitchen LANTUS SOLOSTAR 100 UNIT/ML Solostar Pen Inject 20 Units into the skin at bedtime.  0  . levothyroxine (SYNTHROID, LEVOTHROID) 75 MCG tablet Take 75  mcg by mouth daily before breakfast.     . losartan-hydrochlorothiazide (HYZAAR) 100-12.5 MG per tablet Take 1 tablet by mouth daily. 90 tablet 3  . metoprolol (LOPRESSOR) 50 MG tablet Take 1 tablet (50 mg total) by mouth 2 (two) times daily. 180 tablet 3  . Multiple Vitamin (MULTIVITAMIN WITH MINERALS) TABS tablet Take 1 tablet by mouth at bedtime.    Marland Kitchen NITROSTAT 0.4 MG SL tablet Take  0.4 mg by mouth every 5 (five) minutes as needed for chest pain.   0  . pantoprazole (PROTONIX) 40 MG tablet Take 1 tablet (40 mg total) by mouth daily before breakfast. 90 tablet 3  . potassium chloride SA (K-DUR,KLOR-CON) 20 MEQ tablet Take 20 mEq by mouth daily.  2  . Probiotic Product (PROBIOTIC PO) Take 1 tablet by mouth daily.    . sitaGLIPtin-metformin (JANUMET) 50-1000 MG per tablet Take 1 tablet by mouth 2 (two) times daily. Hold for 48 hours, restart on 06/05/2014 (Patient taking differently: Take 1 tablet by mouth 2 (two) times daily. )     No current facility-administered medications for this visit.     PHYSICAL EXAMINATION: ECOG PERFORMANCE STATUS: 1 - Symptomatic but completely ambulatory  Vitals:   02/21/17 1006  BP: 137/70  Pulse: 79  Resp: 16  Temp: 97.9 F (36.6 C)   Filed Weights   02/21/17 1006  Weight: 156 lb 8 oz (71 kg)    GENERAL:alert, no distress and comfortable SKIN: skin color, texture, turgor are normal, no rashes or significant lesions EYES: normal, Conjunctiva are pink and non-injected, sclera clear OROPHARYNX:no exudate, no erythema and lips, buccal mucosa, and tongue normal  NECK: supple, thyroid normal size, non-tender, without nodularity LYMPH:  no palpable lymphadenopathy in the cervical, axillary or inguinal LUNGS: clear to auscultation and percussion with normal breathing effort HEART: regular rate & rhythm and no murmurs and no lower extremity edema ABDOMEN:abdomen soft, non-tender and normal bowel sounds MUSCULOSKELETAL:no cyanosis of digits and no clubbing  NEURO: alert & oriented x 3 with fluent speech, no focal motor/sensory deficits EXTREMITIES: No lower extremity edema   LABORATORY DATA:  I have reviewed the data as listed   Chemistry      Component Value Date/Time   NA 129 (L) 02/19/2017 1409   K 3.8 02/19/2017 1409   CL 94 (L) 02/19/2017 1409   CO2 25 02/19/2017 1409   BUN 13 02/19/2017 1409   CREATININE 0.59 02/19/2017  1409      Component Value Date/Time   CALCIUM 9.3 02/19/2017 1409   ALKPHOS 52 02/19/2017 1409   AST 15 02/19/2017 1409   ALT 14 02/19/2017 1409   BILITOT 0.4 02/19/2017 1409       Lab Results  Component Value Date   WBC 9.6 02/19/2017   HGB 9.9 (L) 02/19/2017   HCT 32.5 (L) 02/19/2017   MCV 71.9 (L) 02/19/2017   PLT 367 02/19/2017   NEUTROABS 7.1 (H) 02/19/2017    ASSESSMENT & PLAN:  Malignant neoplasm of upper-outer quadrant of left breast in female, estrogen receptor positive ([facility]) Left breast biopsy02/19/2018: 12:30: IDC with DCIS involving underlying papillary lesion, grade 1, ER 100%, PR 90%, Ki-67 10%, HER-2 negative ratio 1.29; irregular 1.9 cm mass axilla negative, T1 CN 0 stage IA clinical stage Left lumpectomy 12/25/2016: IDC grade 1, 1.5 cm, low-grade DCIS, resection margins negative, 0/2 lymph nodes negative, ER 100%, PR 100%, HER-2 negative ratio 1.29, Ki-67 10% T1 cN0 stage IA Oncotype DX score 2: 4% risk of  recurrence  Adjuvant radiation therapy 01/30/2017- 02/21/2017  Treatment plan: Anastrozole 1 mg by mouth daily 5 years Anastrozole counseling:We discussed the risks and benefits of anti-estrogen therapy with aromatase inhibitors. These include but not limited to insomnia, hot flashes, mood changes, vaginal dryness, bone density loss, and weight gain. We strongly believe that the benefits far outweigh the risks. Patient understands these risks and consented to starting treatment. Planned treatment duration is 5 years.  Return to clinic in 3 months for survivorship care plan was up and toxicities evaluation on anastrozole   I spent 25 minutes talking to the patient of which more than half was spent in counseling and coordination of care.  No orders of the defined types were placed in this encounter.  The patient has a good understanding of the overall plan. she agrees with it. she will call with any problems that may develop before the next visit here.    Rulon Eisenmenger, MD 02/21/17

## 2017-02-21 NOTE — Assessment & Plan Note (Signed)
Left breast biopsy02/19/2018: 12:30: IDC with DCIS involving underlying papillary lesion, grade 1, ER 100%, PR 90%, Ki-67 10%, HER-2 negative ratio 1.29; irregular 1.9 cm mass axilla negative, T1 CN 0 stage IA clinical stage Left lumpectomy 12/25/2016: IDC grade 1, 1.5 cm, low-grade DCIS, resection margins negative, 0/2 lymph nodes negative, ER 100%, PR 100%, HER-2 negative ratio 1.29, Ki-67 10% T1 cN0 stage IA Oncotype DX score 2: 4% risk of recurrence  Adjuvant radiation therapy 01/30/2017- 02/21/2017  Treatment plan: Anastrozole 1 mg by mouth daily 5 years Anastrozole counseling:We discussed the risks and benefits of anti-estrogen therapy with aromatase inhibitors. These include but not limited to insomnia, hot flashes, mood changes, vaginal dryness, bone density loss, and weight gain. We strongly believe that the benefits far outweigh the risks. Patient understands these risks and consented to starting treatment. Planned treatment duration is 5 years.  Return to clinic in 3 months for survivorship care plan was up and toxicities evaluation on anastrozole

## 2017-02-22 ENCOUNTER — Encounter: Payer: Self-pay | Admitting: Radiation Oncology

## 2017-02-22 NOTE — Progress Notes (Signed)
  Radiation Oncology         [phone] ________________________________  Name: Susan Barnett MRN: [id number]  Date: 02/22/2017  DOB: 09-05-1945  End of Treatment Note  Diagnosis:   Stage I T1cN0M0 Left Breast UOQ Invasive Ductal Carcinoma with DCIS, ER 100%/ PR 100% / Her2-, Grade 1  Indication for treatment:  Curative       Radiation treatment dates:   01/30/17 - 02/21/17  Site/dose:   Left Breast / 42.56 Gy in 16 fractions  Beams/energy:  1) tangents, 3D conformal  / 10, 6MV  Narrative: The patient tolerated radiation treatment relatively well. She reported some fatigue throughout treatment. She experienced radiation related skin changes in the left breast including mild erythema.  Plan: The patient has completed radiation treatment. The patient will continue to use yeast cream as prescribed in conjunction with Radiaplex. The patient will return to radiation oncology clinic for routine followup in one month. I advised them to call or return sooner if they have any questions or concerns related to their recovery or treatment.  -----------------------------------  Eppie Gibson, MD

## 2017-02-25 ENCOUNTER — Ambulatory Visit: Payer: Medicare Other | Admitting: Internal Medicine

## 2017-02-26 DIAGNOSIS — I10 Essential (primary) hypertension: Secondary | ICD-10-CM | POA: Diagnosis not present

## 2017-02-26 DIAGNOSIS — D649 Anemia, unspecified: Secondary | ICD-10-CM | POA: Diagnosis not present

## 2017-02-26 DIAGNOSIS — E1140 Type 2 diabetes mellitus with diabetic neuropathy, unspecified: Secondary | ICD-10-CM | POA: Diagnosis not present

## 2017-02-26 DIAGNOSIS — C50912 Malignant neoplasm of unspecified site of left female breast: Secondary | ICD-10-CM | POA: Diagnosis not present

## 2017-02-28 DIAGNOSIS — M1712 Unilateral primary osteoarthritis, left knee: Secondary | ICD-10-CM | POA: Diagnosis not present

## 2017-03-04 ENCOUNTER — Telehealth: Payer: Self-pay

## 2017-03-04 NOTE — Telephone Encounter (Signed)
Susan Barnett called me today concerned about Breast irritation after completing radiation recently. She reports peeling, redness, and pain underneath her Left Breast which she developed after finishing radiation. She has been applying neosporin to this area as directed by Dr. Isidore Moos. She tells me that the pain to this area has improved since applying neosporin and the redness to the area has become darker in color. She denies signs and symptoms of infection such as drainage or fever. I encouraged her to continue applying neosporin and keeping the area as dry as possible. She voiced her understanding.  She has a follow up appointment on 04/05/17 with Dr. Isidore Moos, but was instructed to call back sooner if she developed a fever, increased pain, or drainage.

## 2017-03-08 ENCOUNTER — Telehealth: Payer: Self-pay | Admitting: Genetics

## 2017-03-08 NOTE — Telephone Encounter (Signed)
Received the following messages from genetic testing laboratory [facility]:  I relayed the above information to Susan Barnett, highlighting three options: billing through insurance, self-pay, and canceling testing. Ms. Kroner would like to consult her husband before making a final decision. She plans to call me back to let me know her choice by the end of today. I will await her call.

## 2017-03-12 ENCOUNTER — Telehealth: Payer: Self-pay | Admitting: Genetics

## 2017-03-12 NOTE — Telephone Encounter (Signed)
Called Susan Barnett to inquire about her decision regarding genetic testing and associated billing options. She stated that she has been busy with company and has not had a chance to make a decision. I encouraged her to call [facility] once she has made a decision about whether to continue testing. I gave her [facility]'s phone number. I encouraged her to call me if I can be of help in the process.

## 2017-03-14 ENCOUNTER — Ambulatory Visit (INDEPENDENT_AMBULATORY_CARE_PROVIDER_SITE_OTHER): Payer: Medicare Other | Admitting: Internal Medicine

## 2017-03-14 ENCOUNTER — Encounter: Payer: Self-pay | Admitting: Internal Medicine

## 2017-03-14 VITALS — BP 154/62 | HR 72 | Ht 62.0 in | Wt 156.8 lb

## 2017-03-14 DIAGNOSIS — Z7901 Long term (current) use of anticoagulants: Secondary | ICD-10-CM

## 2017-03-14 DIAGNOSIS — D509 Iron deficiency anemia, unspecified: Secondary | ICD-10-CM | POA: Diagnosis not present

## 2017-03-14 DIAGNOSIS — K219 Gastro-esophageal reflux disease without esophagitis: Secondary | ICD-10-CM

## 2017-03-14 DIAGNOSIS — E109 Type 1 diabetes mellitus without complications: Secondary | ICD-10-CM

## 2017-03-14 DIAGNOSIS — K5900 Constipation, unspecified: Secondary | ICD-10-CM

## 2017-03-14 DIAGNOSIS — R1310 Dysphagia, unspecified: Secondary | ICD-10-CM

## 2017-03-14 MED ORDER — NA SULFATE-K SULFATE-MG SULF 17.5-3.13-1.6 GM/177ML PO SOLN: 1.0000 | Freq: Once | ORAL | 0 refills | Status: AC

## 2017-03-14 NOTE — Progress Notes (Signed)
HISTORY OF PRESENT ILLNESS:  Susan Barnett is a pleasant 71 y.o. female with multiple significant medical problems including long-standing diabetes mellitus L insulin requiring, coronary artery disease with prior intervention and subsequent CABG, breast cancer, and osteoarthritis. She is on a number of medications including insulin, Janumet, and Plavix. I last saw the patient 05/14/2007 when she underwent colonoscopy for routine screening and upper endoscopy to evaluate reflux symptoms. Colonoscopy was normal. Follow-up continues recommended. Upper endoscopy revealed a small hiatal hernia and small gastric polyps which were biopsied and benign. She is sent today by Dr. Forde Dandy and Dr. Kenton Kingfisher with multiple chief complaints including iron deficiency anemia and intermittent solid food dysphagia. Outside records, laboratories, and x-rays reviewed personally. Patient was having problems with abdominal pain. Workup negative. Discontinuation of one of her medications resulted in resolution of pain. She does have chronic reflux for which she is maintained on pantoprazole. Off medication significant symptoms. On medication good relief of classic symptoms. Recent months she has noticed intermittent solid food dysphagia. This is new. No weight loss. She was recently found to be anemic with a hemoglobin of 10.2 MCV 71. No stool studies. PT 12 levels were normal. Subsequent iron studies confirmed iron deficiency with iron saturation of 3% and ferritin level of 9.3. Hemoglobin from April was 12.7. About one month ago she was started on iron. Stools are dark. She does have chronic intermittent constipation which is unchanged. No family history of colon cancer. Her cardiologist is Dr. Tamala Julian.   REVIEW OF SYSTEMS:  All non-GI ROS negative unless otherwise stated in the history of present illness except for allergy, arthritis, back pain, cough, fatigue, hearing problems, skin rash  Past Medical History:  Diagnosis Date  .  Anemia    mild  . Anginal pain ([place])    with exertion, last time prior to cardiac caht 06/02/14  . Anxiety    situational  . Arthritis   . CAD (coronary artery disease)    Dr. Linard Millers follows, no problems now  . Complication of anesthesia   . Diabetes mellitus, type 2 ([place])    TYPE 2  . Dysrhythmia    occ skips a beat, rate was fast before beta blocker.  . Gastric polyp   . GERD (gastroesophageal reflux disease)   . Headache    hx of  . Heartburn   . Hiatal hernia    mild head tremors  . Hypertension   . Hypothyroidism   . Occasional tremors    "of head", slight hands  . Pneumonia   . PONV (postoperative nausea and vomiting) 02/24/14  . Shortness of breath    with exertion  . Sleep apnea    cpap used nightly x 2 yrs now.    Past Surgical History:  Procedure Laterality Date  . BREAST BIOPSY Left 1987  . BREAST LUMPECTOMY WITH RADIOACTIVE SEED AND SENTINEL LYMPH NODE BIOPSY Left 12/25/2016   Procedure: BREAST LUMPECTOMY WITH RADIOACTIVE SEED AND SENTINEL LYMPH NODE BIOPSY;  Surgeon: [name], MD;  Location: [facility];  Service: General;  Laterality: Left;  . CARDIAC CATHETERIZATION  06/02/2014   LAD 30%, D1 80%, CFX 755, RCA > 95% ISR, rx w/ PTCA, heavy calcification, EF 65%  . CORONARY ARTERY BYPASS GRAFT N/A 06/14/2014   Procedure: CORONARY ARTERY BYPASS GRAFTING (CABG);  Surgeon: Gaye Pollack, MD;  Location: [place];  Service: Open Heart Surgery;  Laterality: N/A;  Times 2 using endoscopically harvested right saphenous vein.  . coronary stents  x3 stents placed-prior ablation  . DILATION AND CURETTAGE OF UTERUS    . ELBOW FRACTURE SURGERY Left   . INCONTINENCE SURGERY     sling  . KNEE ARTHROSCOPY Right 02/24/2014   Procedure: RIGHT KNEE ARTHROSCOPY WITH DEBRIDEMENT ;  Surgeon: Gearlean Alf, MD;  Location: [facility];  Service: Orthopedics;  Laterality: Right;  . LEFT HEART CATHETERIZATION WITH CORONARY ANGIOGRAM N/A 06/02/2014   Procedure: LEFT HEART  CATHETERIZATION WITH CORONARY ANGIOGRAM;  Surgeon: Sinclair Grooms, MD;  Location: [facility];  Service: Cardiovascular;  Laterality: N/A;  . TEE WITHOUT CARDIOVERSION N/A 06/14/2014   Procedure: TRANSESOPHAGEAL ECHOCARDIOGRAM (TEE);  Surgeon: Gaye Pollack, MD;  Location: [facility];  Service: Open Heart Surgery;  Laterality: N/A;    Social History AVRI PAIVA  reports that she has never smoked. She has never used smokeless tobacco. She reports that she does not drink alcohol or use drugs.  family history includes Breast cancer (age of onset: 59) in her cousin; Diabetes in her mother; Healthy in her brother; Heart attack in her mother; Hodgkin's lymphoma in her maternal uncle; Hypertension in her mother; Lung cancer in her brother; Ulcerative colitis in her daughter.  Allergies  Allergen Reactions  . Asa [Aspirin] Anaphylaxis  . Compazine [Prochlorperazine Edisylate] Swelling and Other (See Comments)    Tongue swelling   . Nsaids Hives and Swelling  . Shellfish Allergy Hives and Swelling  . Sulfa Antibiotics Hives and Swelling  . Zantac [Ranitidine Hcl] Hives and Swelling  . Lisinopril Cough       PHYSICAL EXAMINATION: Vital signs: BP (!) 154/62   Pulse 72   Ht 5\' 2"  (1.575 m)   Wt 156 lb 12.8 oz (71.1 kg)   BMI 28.68 kg/m   Constitutional: generally well-appearing, no acute distress Psychiatric: alert and oriented x3, cooperative Eyes: extraocular movements intact, anicteric, conjunctiva pink Mouth: oral pharynx moist, no lesions Neck: suppleWithout adenopathy Lymph: no lymphadenopathy Cardiovascular: heart regular rate and rhythm, no murmur Lungs: clear to auscultation bilaterally Abdomen: soft, nontender, nondistended, no obvious ascites, no peritoneal signs, normal bowel sounds, no organomegaly Rectal:Deferred until colonoscopy Extremities: no clubbing cyanosis or lower extremity edema bilaterally Skin: no lesions on visible extremities Neuro: No focal deficits.  Cranial nerves intact. Normal DTRs. No asterixis.   ASSESSMENT:  #1. Iron deficiency anemia. Rule out GI mucosal pathology #2. GERD. Chronic. Requires PPI for control #3. New onset intermittent solid food dysphagia. Rule out peptic stricture #4. Chronic constipation. Stable #5. Multiple medical problems including coronary artery disease for which she is on Plavix and diabetes for which she is on insulin #6. Prior colonoscopy and upper endoscopy 2008 as described   PLAN:  #1. Schedule colonoscopy to evaluate iron deficiency anemia. The patient is high-risk given her comorbidities and the need to address both antiplatelet therapy and diabetic medications for her procedure. It has been recommended that she hold Plavix 1 week prior to the procedures as she will require therapy. We will confer with her cardiologist Dr. Tamala Julian to see if this is acceptable.The nature of the procedure, as well as the risks, benefits, and alternatives were carefully and thoroughly reviewed with the patient. Ample time for discussion and questions allowed. The patient understood, was satisfied, and agreed to proceed. As well, recommended adjustments in her evening insulin from 26 units to 18 units and hold her oral agent in the morning of the procedure to avoid and wanted hypoglycemia. #2. Schedule upper endoscopy to evaluate iron deficiency  anemia and dysphagia. May require esophageal dilation. The patient is high risk as above.The nature of the procedure, as well as the risks, benefits, and alternatives were carefully and thoroughly reviewed with the patient. Ample time for discussion and questions allowed. The patient understood, was satisfied, and agreed to proceed. #3. Reflux precautions #4. Continue iron until one week prior to the procedure then stop to assist with colonic preparation #5. Continue pantoprazole #6. Strict reflux precautions #7. Ongoing general medical care and diabetic care with Dr. Kenton Kingfisher and Dr.  Forde Dandy respectively  A copy of this consultation note has been sent to Dr. Kenton Kingfisher and Dr. Forde Dandy and Dr. Tamala Julian

## 2017-03-14 NOTE — Patient Instructions (Signed)
You have been scheduled for an endoscopy and colonoscopy. Please follow the written instructions given to you at your visit today. Please pick up your prep supplies at the pharmacy within the next 1-3 days. If you use inhalers (even only as needed), please bring them with you on the day of your procedure. Your physician has requested that you go to www.startemmi.com and enter the access code given to you at your visit today. This web site gives a general overview about your procedure. However, you should still follow specific instructions given to you by our office regarding your preparation for the procedure.   Hold your iron one week prior to the procedure.

## 2017-03-15 ENCOUNTER — Telehealth: Payer: Self-pay | Admitting: Internal Medicine

## 2017-03-19 NOTE — Telephone Encounter (Signed)
Spoke to patient and told her I would leave a sample of Suprep up front to be picked up.  Patient agreed.

## 2017-03-26 ENCOUNTER — Telehealth: Payer: Self-pay

## 2017-03-26 NOTE — Telephone Encounter (Signed)
    RE: Susan Barnett DOB: [birth year] MRN: [id number]   Dear Dr. Tamala Julian:   We have scheduled the above patient for an endoscopic procedure. Our records show that she is on anticoagulation therapy.   Please advise as to how long the patient may come off her therapy of Plavix prior to the procedure, which is scheduled for 05/27/2017.  Please fax back/ or route the completed form to [place] at [phone].   Sincerely,    Phillis Haggis

## 2017-03-26 NOTE — Telephone Encounter (Signed)
May hold Plavix up to 5 days prior to procedure

## 2017-04-02 ENCOUNTER — Encounter: Payer: Self-pay | Admitting: Radiation Oncology

## 2017-04-02 NOTE — Telephone Encounter (Signed)
Spoke with patient and told her that per Dr. Tamala Julian she could hold her Plavix for 5 days prior to the procedure.  Patient agreed.

## 2017-04-05 ENCOUNTER — Encounter: Payer: Self-pay | Admitting: Radiation Oncology

## 2017-04-05 ENCOUNTER — Ambulatory Visit
Admission: RE | Admit: 2017-04-05 | Discharge: 2017-04-05 | Disposition: A | Discharge status: Home / Self Care | Payer: Medicare Other | Source: Ambulatory Visit | Attending: Radiation Oncology | Admitting: Radiation Oncology

## 2017-04-05 DIAGNOSIS — Z510 Encounter for antineoplastic radiation therapy: Secondary | ICD-10-CM | POA: Diagnosis not present

## 2017-04-05 DIAGNOSIS — C50412 Malignant neoplasm of upper-outer quadrant of left female breast: Secondary | ICD-10-CM

## 2017-04-05 DIAGNOSIS — Z170 Estrogen receptor positive status [ER+]: Principal | ICD-10-CM

## 2017-04-05 HISTORY — DX: Personal history of irradiation: Z92.3

## 2017-04-05 NOTE — Progress Notes (Signed)
Radiation Oncology         [phone] ________________________________  Name: Susan Barnett MRN: [id number]  Date: 04/05/2017  DOB: 05-12-1946  Follow-Up Visit Note  Outpatient  CC: Shirline Frees, MD  [name], MD  Diagnosis:      ICD-10-CM   1. Malignant neoplasm of upper-outer quadrant of left breast in female, estrogen receptor positive ([place]) C50.412    Z17.0    Stage I [id number] Left Breast UOQ Invasive Ductal Carcinoma with DCIS, ER 100%/ PR 100% / Her2-, Grade 1  Previous Radiation:  42.56 Gy in 16 fractions Completed on 02/21/2017  Narrative:  The patient returns today for routine follow-up after completing radiation one month ago.  She is doing well. She denies any new palpable lumps or bumps in her breasts. She continues to be followed by medical oncology and is taking Anastrozole.         .   On review of systems, the patient is positive for occasional "deep aches" over her left breast and persistent redness underneath her left breast.                  ALLERGIES:  is allergic to asa [aspirin]; compazine [prochlorperazine edisylate]; nsaids; shellfish allergy; sulfa antibiotics; zantac [ranitidine hcl]; and lisinopril.  Meds: Current Outpatient Prescriptions  Medication Sig Dispense Refill  . amLODipine (NORVASC) 5 MG tablet Take 5 mg by mouth daily.     Marland Kitchen anastrozole (ARIMIDEX) 1 MG tablet Take 1 tablet (1 mg total) by mouth daily. 90 tablet 3  . atorvastatin (LIPITOR) 10 MG tablet Take 10 mg by mouth every evening.     . bisacodyl (DULCOLAX) 5 MG EC tablet Take 5 mg by mouth daily as needed for mild constipation.    . cetirizine (ZYRTEC) 10 MG tablet Take 10 mg by mouth daily.    . cloNIDine (CATAPRES) 0.1 MG tablet Take 0.1 mg by mouth every evening.     . clopidogrel (PLAVIX) 75 MG tablet Take 75 mg by mouth daily.    . Coenzyme Q10 300 MG CAPS Take 300 mg by mouth 2 (two) times daily.    . Hydrocortisone (IWLNLGXQJ-19 EX) Apply 1 application topically 2  (two) times daily as needed (for dry/itchy affected area(s) of skin).    . iron polysaccharides (NIFEREX) 150 MG capsule Take 1 capsule (150 mg total) by mouth daily.    Marland Kitchen LANTUS SOLOSTAR 100 UNIT/ML Solostar Pen Inject 28 Units into the skin at bedtime.   0  . levothyroxine (SYNTHROID, LEVOTHROID) 75 MCG tablet Take 75 mcg by mouth daily before breakfast.     . losartan-hydrochlorothiazide (HYZAAR) 100-12.5 MG per tablet Take 1 tablet by mouth daily. 90 tablet 3  . metoprolol (LOPRESSOR) 50 MG tablet Take 1 tablet (50 mg total) by mouth 2 (two) times daily. 180 tablet 3  . Multiple Vitamin (MULTIVITAMIN WITH MINERALS) TABS tablet Take 1 tablet by mouth at bedtime.    Marland Kitchen NITROSTAT 0.4 MG SL tablet Take 0.4 mg by mouth every 5 (five) minutes as needed for chest pain.   0  . pantoprazole (PROTONIX) 40 MG tablet Take 1 tablet (40 mg total) by mouth daily before breakfast. 90 tablet 3  . potassium chloride SA (K-DUR,KLOR-CON) 20 MEQ tablet Take 20 mEq by mouth daily.  2  . Probiotic Product (PROBIOTIC PO) Take 1 tablet by mouth daily.    . sitaGLIPtin-metformin (JANUMET) 50-1000 MG per tablet Take 1 tablet by mouth 2 (two) times daily. Hold  for 48 hours, restart on 06/05/2014 (Patient taking differently: Take 1 tablet by mouth 2 (two) times daily. )     No current facility-administered medications for this encounter.      Physical Findings:  height is 5' 2" (1.575 m) and weight is 156 lb (70.8 kg). Her temperature is 98 F (36.7 C). Her blood pressure is 160/62 (abnormal) and her pulse is 72. Her oxygen saturation is 100%. .    General: Alert and oriented, in no acute distress Skin: She has a narrow line of erythema at L IM fold, which may be a persistent yeast infection which she has coped with in the past. She has a couple tiny whiteheads along the inframammary fold. Residual erythema and hyperpigmentation over the left breast.      Lab Findings: Lab Results  Component Value Date   WBC 9.6  02/19/2017   HGB 9.9 (L) 02/19/2017   HCT 32.5 (L) 02/19/2017   MCV 71.9 (L) 02/19/2017   PLT 367 02/19/2017     Radiographic Findings: No results found.  Impression/Plan: This is a very pleasant woman with a history of left breast cancer.  She knows continue yearly mammography. Continue med/onc visits. I will see her back as needed.  I encouraged her to call if she has any issues in the interim. She was given Miconazole powder to apply to her inframammary fold, and I advised her to use Vitamin E lotion over the dry skin (but not in the IM fold) where treatment was directed.  If not improved in the next few weeks, contact PCP or dermatology or me. _____________________________________   Eppie Gibson, MD  This document serves as a record of services personally performed by Eppie Gibson, MD. It was created on her behalf by Rae Lips, a trained medical scribe. The creation of this record is based on the scribe's personal observations and the provider's statements to them. This document has been checked and approved by the attending provider.

## 2017-04-05 NOTE — Progress Notes (Addendum)
Susan Barnett presents for follow up of radiation completed 02/21/17 to her Left Breast. She denies pain at this time. She does report "deep aches" at times over her Left Breast. She continues to have redness underneath her Left Breast. She is asking for a tube of radiaplex today. She is using cetaphil currently to this area. She denies fatigue. She is taking Anastrozole daily. She will see survivorship on 05/24/17.  BP (!) 160/62   Pulse 72   Temp 98 F (36.7 C)   Ht 5\' 2"  (1.575 m)   Wt 156 lb (70.8 kg)   SpO2 100% Comment: room air  BMI 28.53 kg/m    Wt Readings from Last 3 Encounters:  04/05/17 156 lb (70.8 kg)  03/14/17 156 lb 12.8 oz (71.1 kg)  02/21/17 156 lb 8 oz (71 kg)

## 2017-04-05 NOTE — Addendum Note (Signed)
Encounter addended by: Darcel Frane, Stephani Police, RN on: 04/05/2017  1:39 PM<BR>    Actions taken: Sign clinical note

## 2017-04-10 ENCOUNTER — Telehealth: Payer: Self-pay | Admitting: Genetics

## 2017-04-12 ENCOUNTER — Ambulatory Visit: Payer: Self-pay | Admitting: Genetics

## 2017-04-12 ENCOUNTER — Encounter: Payer: Self-pay | Admitting: Genetics

## 2017-04-12 DIAGNOSIS — Z1379 Encounter for other screening for genetic and chromosomal anomalies: Secondary | ICD-10-CM

## 2017-04-12 HISTORY — DX: Encounter for other screening for genetic and chromosomal anomalies: Z13.79

## 2017-04-12 NOTE — Progress Notes (Signed)
HPI: Ms. Breece was previously seen in the [facility] clinic on 02/19/2017 due to a personal and family history of cancer and concerns regarding a hereditary predisposition to cancer. Please refer to our prior cancer genetics clinic note for more information regarding Ms. Winkowski's medical, social and family histories, and our assessment and recommendations, at the time. Ms. Gonet recent genetic test results were disclosed to her, as were recommendations warranted by these results. These results and recommendations are discussed in more detail below.  CANCER HISTORY: In May 2018, at the age of 46, Ms. Hamlett was diagnosed with ER/PR+ HER2- invasive ductal carcinoma of the left breast. This was treated with lumpectomy and radiation.     Malignant neoplasm of upper-outer quadrant of left breast in female, estrogen receptor positive ([place])   10/01/2016 Initial Diagnosis    Left breast biopsy: 12:30: IDC with DCIS involving underlying papillary lesion, grade 1, ER 100%, PR 90%, Ki-67 10%, HER-2 negative ratio 1.29; irregular 1.9 cm mass axilla negative, T1 CN 0 stage IA clinical stage      12/25/2016 Surgery    Left lumpectomy: IDC grade 1, 1.5 cm, low-grade DCIS, resection margins negative, 0/2 lymph nodes negative, ER 100%, PR 100%, HER-2 negative ratio 1.29, Ki-67 10% T1 cN0 stage IA      12/25/2016 Oncotype testing    Oncotype DX score 2: Risk of recurrence 4%      01/30/2017 - 02/21/2017 Radiation Therapy    Adjuvant radiation therapy      03/25/2017 Genetic Testing    Genetic counseling and testing for hereditary cancer syndromes performed on 02/19/2017. Results are negative for pathogenic mutations in 46 genes analyzed by [facility] Common Hereditary Cancers Panel. Results are dated 03/25/2017. Genes tested: APC, ATM, AXIN2, BARD1, BMPR1A, BRCA1, BRCA2, BRIP1, CDH1, CDKN2A, CHEK2, CTNNA1, DICER1, EPCAM, GREM1, HOXB13, KIT, MEN1, MLH1, MSH2, MSH3, MSH6, MUTYH, NBN, NF1,  NTHL1, PALB2, PDGFRA, PMS2, POLD1, POLE, PTEN, RAD50, RAD51C, RAD51D, SDHA, SDHB, SDHC, SDHD, SMAD4, SMARCA4, STK11, TP53, TSC1, TSC2, and VHL.           FAMILY HISTORY:  We obtained a detailed, 4-generation family history.  Significant diagnoses are listed below: Family History  Problem Relation Age of Onset  . Heart attack Mother   . Hypertension Mother   . Diabetes Mother   . Lung cancer Brother        d.57 from metastatic disease. History of smoking.  Marland Kitchen Healthy Brother   . Breast cancer Cousin 30       d.30s  . Hodgkin's lymphoma Maternal Uncle        d.62s  . Ulcerative colitis Daughter    Ms. Wehrli has a son, age 56, and daughter, age 25, without cancers. Ms. Wain has two brothers. One brother is 36 without cancers. The other brother died at 60 from lung cancer that had metastasized to his liver at the time of diagnosis. He had a history of smoking.   Ms. Pasch mother died at 37 without cancers. Ms. Alexa had four maternal uncles and two maternal aunts. One uncle died with Hodgkin's lymphoma in his early-60s. The rest of her maternal relatives are not known to have cancer. Her maternal grandmother died at 35 and her maternal grandfather died in his 56s.  Ms. Bari father died at 18 following surgery for a lung injury he obtained while in the [place] that involved loss of oxygen. There is limited information about Ms. Such's paternal family history. Ms. Primeau reports that  she had 3 or 4 paternal aunts and one paternal uncle. She would not know if any had cancer. One paternal first-cousin died from breast cancer in her 62s. Ms. Solecki paternal grandparents both died of unknown cause before her parents met and she does not know anything else about their health.  Ms. Milliron is unaware of previous family history of genetic testing for hereditary cancer risks. Patient's maternal ancestors are of [place] descent, and paternal ancestors are of [place]  descent. There is no reported Ashkenazi Jewish ancestry. There is no known consanguinity.  GENETIC TEST RESULTS: Genetic testing performed through [facility] Common Hereditary Cancer Panel reported out on 03/25/2017 showed no pathogenic mutations. [facility] Common Hereditary Cancers Panel includes analysis of the following 46 genes: APC, ATM, AXIN2, BARD1, BMPR1A, BRCA1, BRCA2, BRIP1, CDH1, CDKN2A, CHEK2, CTNNA1, DICER1, EPCAM, GREM1, HOXB13, KIT, MEN1, MLH1, MSH2, MSH3, MSH6, MUTYH, NBN, NF1, NTHL1, PALB2, PDGFRA, PMS2, POLD1, POLE, PTEN, RAD50, RAD51C, RAD51D, SDHA, SDHB, SDHC, SDHD, SMAD4, SMARCA4, STK11, TP53, TSC1, TSC2, and VHL.  The test report will be scanned into EPIC and will be located under the Molecular Pathology section of the Results Review tab.A portion of the result report is included below for reference.    We discussed with Ms. Duarte that since the current genetic testing is not perfect, it is possible there may be a gene mutation in one of these genes that current testing cannot detect, but that chance is small. We also discussed, that it is possible that another gene that has not yet been discovered, or that we have not yet tested, is responsible for the cancer diagnoses in the family. Therefore, important to remain in touch with cancer genetics in the future so that we can continue to offer Ms. Guzzetta the most up to date genetic testing.   CANCER SCREENING RECOMMENDATIONS: This result indicates that it is unlikely Ms. Ehlert has an increased risk for a future cancer due to a mutation in one of these genes. Because no causative or actionable mutations were identified, it is recommended she continue to follow the cancer management and screening guidelines provided by her oncology and primary healthcare providers.  RECOMMENDATIONS FOR FAMILY MEMBERS: Women in this family, such as Ms. Quang's daughter, might be at some increased risk of developing breast cancer, over the general  population risk, simply due to the family history of cancer. We recommended women in this family have a yearly mammogram beginning at age 61, or 52 years younger than the earliest onset of cancer, an annual clinical breast exam, and perform monthly breast self-exams. Women in this family should also have a gynecological exam as recommended by their primary provider. All family members should have a colonoscopy by age 65.  Other members of ms. Sago's family may be candidates for genetic testing. If Ms. Haapala family members would like to learn more about hereditary cancer risk and genetic testing options, they should consult their physicians and/or a genetic counselor.   FOLLOW-UP: Lastly, we discussed with Ms. Vessel that cancer genetics is a rapidly advancing field and it is possible that new genetic tests will be appropriate for her and/or her family members in the future. We encouraged her to remain in contact with cancer genetics on an annual basis so we can update her personal and family histories and let her know of advances in cancer genetics that may benefit this family.   Our contact number was provided. Ms. Hink questions were answered to her satisfaction, and she knows she  is welcome to call us at anytime with additional questions or concerns.   Mal Misty, MS, [facility] Certified Naval [email or web address]

## 2017-04-12 NOTE — Telephone Encounter (Signed)
Reviewed that germline genetic testing revealed no pathogenic mutations. This is considered to be a negative result. Testing was performed through [facility]'s 46-gene Common Hereditary Cancers Panel. [facility] Common Hereditary Cancers Panel includes analysis of the following 46 genes: APC, ATM, AXIN2, BARD1, BMPR1A, BRCA1, BRCA2, BRIP1, CDH1, CDKN2A, CHEK2, CTNNA1, DICER1, EPCAM, GREM1, HOXB13, KIT, MEN1, MLH1, MSH2, MSH3, MSH6, MUTYH, NBN, NF1, NTHL1, PALB2, PDGFRA, PMS2, POLD1, POLE, PTEN, RAD50, RAD51C, RAD51D, SDHA, SDHB, SDHC, SDHD, SMAD4, SMARCA4, STK11, TP53, TSC1, TSC2, and VHL.  For more detailed discussion, please see genetic counseling documentation from 04/12/2017. Result report dated 03/25/2017.

## 2017-04-19 DIAGNOSIS — M1712 Unilateral primary osteoarthritis, left knee: Secondary | ICD-10-CM | POA: Diagnosis not present

## 2017-04-23 ENCOUNTER — Encounter: Payer: Self-pay | Admitting: Nurse Practitioner

## 2017-04-23 ENCOUNTER — Ambulatory Visit (INDEPENDENT_AMBULATORY_CARE_PROVIDER_SITE_OTHER): Payer: Medicare Other | Admitting: Nurse Practitioner

## 2017-04-23 VITALS — BP 150/78 | HR 70 | Ht 62.0 in | Wt 155.8 lb

## 2017-04-23 DIAGNOSIS — I10 Essential (primary) hypertension: Secondary | ICD-10-CM

## 2017-04-23 DIAGNOSIS — I259 Chronic ischemic heart disease, unspecified: Secondary | ICD-10-CM | POA: Diagnosis not present

## 2017-04-23 DIAGNOSIS — Z951 Presence of aortocoronary bypass graft: Secondary | ICD-10-CM

## 2017-04-23 DIAGNOSIS — E7800 Pure hypercholesterolemia, unspecified: Secondary | ICD-10-CM | POA: Diagnosis not present

## 2017-04-23 LAB — BASIC METABOLIC PANEL
BUN/Creatinine Ratio: 18 (ref 12–28)
BUN: 12 mg/dL (ref 8–27)
CO2: 24 mmol/L (ref 20–29)
Calcium: 9.6 mg/dL (ref 8.7–10.3)
Chloride: 94 mmol/L — ABNORMAL LOW (ref 96–106)
Creatinine, Ser: 0.66 mg/dL (ref 0.57–1.00)
GFR calc Af Amer: 102 mL/min/{1.73_m2} (ref >59)
GFR calc non Af Amer: 89 mL/min/{1.73_m2} (ref >59)
Glucose: 210 mg/dL — ABNORMAL HIGH (ref 65–99)
Potassium: 4.1 mmol/L (ref 3.5–5.2)
Sodium: 132 mmol/L — ABNORMAL LOW (ref 134–144)

## 2017-04-23 MED ORDER — LOSARTAN POTASSIUM-HCTZ 100-25 MG PO TABS: 1.0000 | ORAL_TABLET | Freq: Every day | ORAL | 3 refills | Status: DC

## 2017-04-23 NOTE — Progress Notes (Signed)
CARDIOLOGY OFFICE NOTE  Date:  04/23/2017    Susan Barnett Date of Birth: 04-Dec-1945 Medical Record #[id number]  PCP:  Shirline Frees, MD  Cardiologist:  Jennings Books  Chief Complaint  Patient presents with  . Coronary Artery Disease    6 month check - seen for Dr. Tamala Julian    History of Present Illness: Susan Barnett is a 71 y.o. female who presents today for a 6 month check. Seen for Dr. Tamala Julian.   She has know CAD with prior CABG x 2 for repeat in-stent restenosis of right coronary per Dr. Cyndia Bent in 2015, hypertension, diabetes mellitus, hyperlipidemia, hypothyroidism and obstructive sleep apnea. She had had BMS of the RCA in 2002 with restenosis treated with brachytherapy - then developed edge restenosis and was treated with DES  - developed restenosis again between the stents treated with angioplasty and stenting with a long DES back in 2006.  She had not been seen since December of 2016 - felt to be doing well at that time.   Diagnosed with left sided breast cancer earlier this year.  She needed lumpectomy. I then saw her for a pre op clearance visit. Cardiac status was ok. Her angina equivalent is typically more shortness of breath.    Comes in today. Here alone. She has lots of issues. BP is creeping up at home. She is not as active. Having issues with her knee - may be looking at knee replacement. Lots of stress with her husband - he has some undefined illness. She is tired. Not wearing her CPAP over the last few weeks. Has trouble with the mask. Has not seen Dr. Radford Pax in several years for her OSA. She notes vivid dreams at night - even to the point she hallucinates - this has been going on ever since her CABG. Fortunately, no chest pain. She does get winded with over exertion and admits she is not exercising. May be getting too much salt.   Past Medical History:  Diagnosis Date  . Anemia    mild  . Anginal pain ([place])    with exertion, last time prior to  cardiac caht 06/02/14  . Anxiety    situational  . Arthritis   . CAD (coronary artery disease)    Dr. Linard Millers follows, no problems now  . Complication of anesthesia   . Diabetes mellitus, type 2 ([place])    TYPE 2  . Dysrhythmia    occ skips a beat, rate was fast before beta blocker.  . Gastric polyp   . Genetic testing 04/12/2017   Ms. Wambolt underwent genetic counseling and testing for hereditary cancer syndromes on 02/19/2017. Her results were negative for mutations in all 46 genes analyzed by [facility]'s 46-gene Common Hereditary Cancers Panel. Genes analyzed include: APC, ATM, AXIN2, BARD1, BMPR1A, BRCA1, BRCA2, BRIP1, CDH1, CDKN2A, CHEK2, CTNNA1, DICER1, EPCAM, GREM1, HOXB13, KIT, MEN1, MLH1, MSH2, MSH3, MSH6, MUTYH, NBN  . GERD (gastroesophageal reflux disease)   . Headache    hx of  . Heartburn   . Hiatal hernia    mild head tremors  . History of radiation therapy 01/30/17- 02/21/17   Left Breast 42.56 Gy in 16 fractions  . Hypertension   . Hypothyroidism   . Occasional tremors    "of head", slight hands  . Pneumonia   . PONV (postoperative nausea and vomiting) 02/24/14  . Shortness of breath    with exertion  . Sleep apnea    cpap used  nightly x 2 yrs now.    Past Surgical History:  Procedure Laterality Date  . BREAST BIOPSY Left 1987  . BREAST LUMPECTOMY WITH RADIOACTIVE SEED AND SENTINEL LYMPH NODE BIOPSY Left 12/25/2016   Procedure: BREAST LUMPECTOMY WITH RADIOACTIVE SEED AND SENTINEL LYMPH NODE BIOPSY;  Surgeon: [name], MD;  Location: [facility];  Service: General;  Laterality: Left;  . CARDIAC CATHETERIZATION  06/02/2014   LAD 30%, D1 80%, CFX 755, RCA > 95% ISR, rx w/ PTCA, heavy calcification, EF 65%  . CORONARY ARTERY BYPASS GRAFT N/A 06/14/2014   Procedure: CORONARY ARTERY BYPASS GRAFTING (CABG);  Surgeon: Gaye Pollack, MD;  Location: [facility];  Service: Open Heart Surgery;  Laterality: N/A;  Times 2 using endoscopically harvested right saphenous vein.  .  coronary stents     x3 stents placed-prior ablation  . DILATION AND CURETTAGE OF UTERUS    . ELBOW FRACTURE SURGERY Left   . INCONTINENCE SURGERY     sling  . KNEE ARTHROSCOPY Right 02/24/2014   Procedure: RIGHT KNEE ARTHROSCOPY WITH DEBRIDEMENT ;  Surgeon: Gearlean Alf, MD;  Location: [facility];  Service: Orthopedics;  Laterality: Right;  . LEFT HEART CATHETERIZATION WITH CORONARY ANGIOGRAM N/A 06/02/2014   Procedure: LEFT HEART CATHETERIZATION WITH CORONARY ANGIOGRAM;  Surgeon: Sinclair Grooms, MD;  Location: [facility];  Service: Cardiovascular;  Laterality: N/A;  . TEE WITHOUT CARDIOVERSION N/A 06/14/2014   Procedure: TRANSESOPHAGEAL ECHOCARDIOGRAM (TEE);  Surgeon: Gaye Pollack, MD;  Location: [facility];  Service: Open Heart Surgery;  Laterality: N/A;     Medications: Current Meds  Medication Sig  . amLODipine (NORVASC) 5 MG tablet Take 5 mg by mouth daily.   Marland Kitchen anastrozole (ARIMIDEX) 1 MG tablet Take 1 tablet (1 mg total) by mouth daily.  Marland Kitchen atorvastatin (LIPITOR) 10 MG tablet Take 10 mg by mouth every evening.   . cetirizine (ZYRTEC) 10 MG tablet Take 10 mg by mouth daily.  . cloNIDine (CATAPRES) 0.1 MG tablet Take 0.1 mg by mouth every evening.   . clopidogrel (PLAVIX) 75 MG tablet Take 75 mg by mouth daily.  . Coenzyme Q10 300 MG CAPS Take 300 mg by mouth 2 (two) times daily.  . Hydrocortisone (YDXAJOINO-67 EX) Apply 1 application topically 2 (two) times daily as needed (for dry/itchy affected area(s) of skin).  . iron polysaccharides (NIFEREX) 150 MG capsule Take 1 capsule (150 mg total) by mouth daily.  Marland Kitchen LANTUS SOLOSTAR 100 UNIT/ML Solostar Pen Inject 28 Units into the skin at bedtime.   Marland Kitchen levothyroxine (SYNTHROID, LEVOTHROID) 75 MCG tablet Take 75 mcg by mouth daily before breakfast.   . metoprolol (LOPRESSOR) 50 MG tablet Take 1 tablet (50 mg total) by mouth 2 (two) times daily.  . Multiple Vitamin (MULTIVITAMIN WITH MINERALS) TABS tablet Take 1 tablet by mouth at bedtime.  Marland Kitchen  NITROSTAT 0.4 MG SL tablet Take 0.4 mg by mouth every 5 (five) minutes as needed for chest pain.   . pantoprazole (PROTONIX) 40 MG tablet Take 1 tablet (40 mg total) by mouth daily before breakfast.  . polyethylene glycol (MIRALAX / GLYCOLAX) packet Take 17 g by mouth daily.  . potassium chloride SA (K-DUR,KLOR-CON) 20 MEQ tablet Take 20 mEq by mouth daily.  . sitaGLIPtin-metformin (JANUMET) 50-1000 MG per tablet Take 1 tablet by mouth 2 (two) times daily. Hold for 48 hours, restart on 06/05/2014 (Patient taking differently: Take 1 tablet by mouth 2 (two) times daily. )  . [DISCONTINUED] losartan-hydrochlorothiazide (HYZAAR) 100-12.5  MG per tablet Take 1 tablet by mouth daily.     Allergies: Allergies  Allergen Reactions  . Asa [Aspirin] Anaphylaxis  . Compazine [Prochlorperazine Edisylate] Swelling and Other (See Comments)    Tongue swelling   . Nsaids Hives and Swelling  . Shellfish Allergy Hives and Swelling  . Sulfa Antibiotics Hives and Swelling  . Zantac [Ranitidine Hcl] Hives and Swelling  . Lisinopril Cough    Social History: The patient  reports that she has never smoked. She has never used smokeless tobacco. She reports that she does not drink alcohol or use drugs.   Family History: The patient's family history includes Breast cancer (age of onset: 82) in her cousin; Diabetes in her mother; Healthy in her brother; Heart attack in her mother; Hodgkin's lymphoma in her maternal uncle; Hypertension in her mother; Lung cancer in her brother; Ulcerative colitis in her daughter.   Review of Systems: Please see the history of present illness.   Otherwise, the review of systems is positive for none.   All other systems are reviewed and negative.   Physical Exam: VS:  BP (!) 150/78   Pulse 70   Ht 5' 2"  (1.575 m)   Wt 155 lb 12.8 oz (70.7 kg)   BMI 28.50 kg/m  .  BMI Body mass index is 28.5 kg/m.  Wt Readings from Last 3 Encounters:  04/23/17 155 lb 12.8 oz (70.7 kg)    04/05/17 156 lb (70.8 kg)  03/14/17 156 lb 12.8 oz (71.1 kg)   Repeat BP by me is 150/70  General: Pleasant. Well developed, well nourished and in no acute distress.   HEENT: Normal.  Neck: Supple, no JVD, carotid bruits, or masses noted.  Cardiac: Regular rate and rhythm. No murmurs, rubs, or gallops. No edema.  Respiratory:  Lungs are clear to auscultation bilaterally with normal work of breathing.  GI: Soft and nontender.  MS: No deformity or atrophy. Gait and ROM intact.  Skin: Warm and dry. Color is normal.  Neuro:  Strength and sensation are intact and no gross focal deficits noted.  Psych: Alert, appropriate and with normal affect.   LABORATORY DATA:  EKG:  EKG is not ordered today.  Lab Results  Component Value Date   WBC 9.6 02/19/2017   HGB 9.9 (L) 02/19/2017   HCT 32.5 (L) 02/19/2017   PLT 367 02/19/2017   GLUCOSE 137 (H) 02/19/2017   CHOL  08/13/2007    116        ATP III CLASSIFICATION:  <200     mg/dL   Desirable  200-239  mg/dL   Borderline High  >=240    mg/dL   High   TRIG 69 08/13/2007   HDL 41 08/13/2007   LDLCALC  08/13/2007    61        Total Cholesterol/HDL:CHD Risk Coronary Heart Disease Risk Table                     Men   Women  1/2 Average Risk   3.4   3.3   ALT 14 02/19/2017   AST 15 02/19/2017   NA 129 (L) 02/19/2017   K 3.8 02/19/2017   CL 94 (L) 02/19/2017   CREATININE 0.59 02/19/2017   BUN 13 02/19/2017   CO2 25 02/19/2017   INR 1.59 (H) 06/14/2014   HGBA1C 14.0 (H) 11/13/2016       BNP (last 3 results) No results for input(s): BNP in the last 8760  hours.  ProBNP (last 3 results) No results for input(s): PROBNP in the last 8760 hours.   Other Studies Reviewed Today:   Assessment/Plan:  1. Coronary artery disease -- no active symptoms reported. She is post CABG from 06/2014. Continue with CV risk factor modification - discussed at length.   2. Essential hypertension - BP drifting up - she tells me she has had  swelling with higher doses of Norvasc. Already with some dry mouth from the clonidine. Will increase the Hyzaar to 100-25. Lab today. BMET in about 2 weeks.   3. Type 2 diabetes mellitus with complication, without long-term current use of insulin ([place]) - followed by PCP. I have asked her to check her sugar at night after one of these reported vivid dreams - may be too low.   4. Hyperlipidemia - on statin therapy - labs by PCP noted. She has some generalized aching - brought me the article from the [facility] - will give her a drug holiday from her Lipitor for the next few weeks.   5. Aching - may be from her statin. She is also on Arimidex. Will give her a drug holiday from her statin. Seeing oncology next month.   6. Vivid dreams/hallucinating - may be from past heart lung machine, ? Low glucose.  7. OSA - needs to get back on her CPAP. Have asked her to try and resume her CPAP  Current medicines are reviewed with the patient today.  The patient does not have concerns regarding medicines other than what has been noted above.  The following changes have been made:  See above.  Labs/ tests ordered today include:    Orders Placed This Encounter  Procedures  . Basic metabolic panel  . Basic metabolic panel     Disposition:   FU with me in about 6 to 8 weeks.   Patient is agreeable to this plan and will call if any problems develop in the interim.   SignedTruitt Merle, NP  04/23/2017 10:40 AM  [facility] [address] [place], [place]  [address] Phone: [phone] Fax: [phone]

## 2017-04-23 NOTE — Patient Instructions (Addendum)
We will be checking the following labs today - BMET  BMET in 2 weeks   Medication Instructions:    Continue with your current medicines. BUT  Ok to stop the Lipitor for 2 weeks - see how your aching is - if it improves let me know - if it does not - go back on it  Increasing the Hyzaar to 100-25 mg a day - this has been sent to your pharmacy    Testing/Procedures To Be Arranged:  N/A  Follow-Up:   See me in about 6 to 8 week  Needs OV with Dr. Radford Pax for her OSA   Other Special Instructions:   Try to get back to wearing your CPAP  Check your sugar if possible at night as we talked about    If you need a refill on your cardiac medications before your next appointment, please call your pharmacy.   Call the [place] office at [phone] if you have any questions, problems or concerns.

## 2017-04-24 ENCOUNTER — Telehealth: Payer: Self-pay | Admitting: Nurse Practitioner

## 2017-04-24 NOTE — Telephone Encounter (Signed)
PT AWARE OF BMET RESULTS .Susan Barnett

## 2017-04-24 NOTE — Telephone Encounter (Signed)
New message     Pt is returning [place] call for test results

## 2017-04-26 DIAGNOSIS — M1712 Unilateral primary osteoarthritis, left knee: Secondary | ICD-10-CM | POA: Diagnosis not present

## 2017-04-30 ENCOUNTER — Telehealth: Payer: Self-pay

## 2017-04-30 NOTE — Telephone Encounter (Signed)
Per Cecille Rubin (see last OV note), called patient to schedule her for follow-up with Dr. Radford Pax. Patient did not pick up the phone. Left message for her that she has beens cheduled 10/4 with Dr. Radford Pax. Instructed her to call at her earliest convenience to confirm/deny appointment.

## 2017-05-02 NOTE — Telephone Encounter (Signed)
Confirmed OV with Dr. Radford Pax 10/4. Patient was grateful for call and agrees with treatment plan.

## 2017-05-03 DIAGNOSIS — Z23 Encounter for immunization: Secondary | ICD-10-CM | POA: Diagnosis not present

## 2017-05-07 ENCOUNTER — Other Ambulatory Visit: Payer: Medicare Other

## 2017-05-07 DIAGNOSIS — I10 Essential (primary) hypertension: Secondary | ICD-10-CM

## 2017-05-07 LAB — BASIC METABOLIC PANEL
BUN/Creatinine Ratio: 14 (ref 12–28)
BUN: 10 mg/dL (ref 8–27)
CO2: 24 mmol/L (ref 20–29)
Calcium: 9.2 mg/dL (ref 8.7–10.3)
Chloride: 91 mmol/L — ABNORMAL LOW (ref 96–106)
Creatinine, Ser: 0.71 mg/dL (ref 0.57–1.00)
GFR calc Af Amer: 98 mL/min/{1.73_m2} (ref >59)
GFR calc non Af Amer: 85 mL/min/{1.73_m2} (ref >59)
Glucose: 158 mg/dL — ABNORMAL HIGH (ref 65–99)
Potassium: 3.9 mmol/L (ref 3.5–5.2)
Sodium: 129 mmol/L — ABNORMAL LOW (ref 134–144)

## 2017-05-08 ENCOUNTER — Encounter: Payer: Self-pay | Admitting: Cardiology

## 2017-05-09 ENCOUNTER — Telehealth: Payer: Self-pay | Admitting: *Deleted

## 2017-05-09 DIAGNOSIS — Z79899 Other long term (current) drug therapy: Secondary | ICD-10-CM

## 2017-05-09 NOTE — Telephone Encounter (Signed)
-----   Message from Burtis Junes, NP sent at 05/07/2017  7:11 PM EDT ----- Ok to report. Lets find out how much water/fluids she is drinking - needs to cut back. Sodium level a little lower - may be due to her HCTZ - recheck BMET in one week. How is her BP doing?

## 2017-05-10 DIAGNOSIS — M1712 Unilateral primary osteoarthritis, left knee: Secondary | ICD-10-CM | POA: Diagnosis not present

## 2017-05-10 NOTE — Progress Notes (Signed)
Noted.   Susan Barnett 

## 2017-05-14 ENCOUNTER — Encounter: Payer: Self-pay | Admitting: Internal Medicine

## 2017-05-15 ENCOUNTER — Other Ambulatory Visit: Payer: Medicare Other

## 2017-05-16 ENCOUNTER — Telehealth: Payer: Self-pay | Admitting: *Deleted

## 2017-05-16 ENCOUNTER — Encounter: Payer: Self-pay | Admitting: Cardiology

## 2017-05-16 ENCOUNTER — Ambulatory Visit (INDEPENDENT_AMBULATORY_CARE_PROVIDER_SITE_OTHER): Payer: Medicare Other | Admitting: Cardiology

## 2017-05-16 ENCOUNTER — Other Ambulatory Visit: Payer: Medicare Other

## 2017-05-16 VITALS — BP 126/64 | HR 71 | Ht 62.0 in | Wt 157.4 lb

## 2017-05-16 DIAGNOSIS — Z79899 Other long term (current) drug therapy: Secondary | ICD-10-CM

## 2017-05-16 DIAGNOSIS — G4733 Obstructive sleep apnea (adult) (pediatric): Secondary | ICD-10-CM | POA: Diagnosis not present

## 2017-05-16 DIAGNOSIS — I10 Essential (primary) hypertension: Secondary | ICD-10-CM | POA: Diagnosis not present

## 2017-05-16 LAB — BASIC METABOLIC PANEL
BUN/Creatinine Ratio: 21 (ref 12–28)
BUN: 15 mg/dL (ref 8–27)
CO2: 21 mmol/L (ref 20–29)
Calcium: 9.7 mg/dL (ref 8.7–10.3)
Chloride: 94 mmol/L — ABNORMAL LOW (ref 96–106)
Creatinine, Ser: 0.72 mg/dL (ref 0.57–1.00)
GFR calc Af Amer: 97 mL/min/{1.73_m2} (ref >59)
GFR calc non Af Amer: 84 mL/min/{1.73_m2} (ref >59)
Glucose: 164 mg/dL — ABNORMAL HIGH (ref 65–99)
Potassium: 4.1 mmol/L (ref 3.5–5.2)
Sodium: 133 mmol/L — ABNORMAL LOW (ref 134–144)

## 2017-05-16 NOTE — Patient Instructions (Signed)

## 2017-05-16 NOTE — Progress Notes (Signed)
Cardiology Office Note:    Date:  05/16/2017   ID:  Susan Barnett, DOB 11-13-1945, MRN [id number]  PCP:  Shirline Frees, MD  Cardiologist:  Fransico Him, MD   Referring MD: Shirline Frees, MD   Chief Complaint  Patient presents with  . Sleep Apnea    History of Present Illness:    Susan Barnett is a 71 y.o. female with a hx of ASCAD followed by Dr. Tamala Julian show has a history of OSA and had been on CPAP but stopped using it.  She had a lumpectomy for breast CA in May and since then could not get used to using it again.  She uses the nasal pillow mask which she says has never fitted well.  She feels the pressure is adequate.  She sleeps on her side.  She does not know if she snores.  She does not wake up gasping for breath while sleeping.  She would like to see if there is a better mask out there that she can use now. She says that she does not feel rested in the am and she takes the mask off while she is sleeping and then is able to get back to sleep without it.    Past Medical History:  Diagnosis Date  . Anemia    mild  . Anginal pain ([place])    with exertion, last time prior to cardiac caht 06/02/14  . Anxiety    situational  . Arthritis   . CAD (coronary artery disease)    Dr. Linard Millers follows, no problems now  . Complication of anesthesia   . Diabetes mellitus, type 2 ([place])    TYPE 2  . Dysrhythmia    occ skips a beat, rate was fast before beta blocker.  . Gastric polyp   . Genetic testing 04/12/2017   Ms. Laverdiere underwent genetic counseling and testing for hereditary cancer syndromes on 02/19/2017. Her results were negative for mutations in all 46 genes analyzed by [facility]'s 46-gene Common Hereditary Cancers Panel. Genes analyzed include: APC, ATM, AXIN2, BARD1, BMPR1A, BRCA1, BRCA2, BRIP1, CDH1, CDKN2A, CHEK2, CTNNA1, DICER1, EPCAM, GREM1, HOXB13, KIT, MEN1, MLH1, MSH2, MSH3, MSH6, MUTYH, NBN  . GERD (gastroesophageal reflux disease)   . Headache    hx of  . Heartburn    . Hiatal hernia    mild head tremors  . History of radiation therapy 01/30/17- 02/21/17   Left Breast 42.56 Gy in 16 fractions  . Hypertension   . Hypothyroidism   . Occasional tremors    "of head", slight hands  . Pneumonia   . PONV (postoperative nausea and vomiting) 02/24/14  . Shortness of breath    with exertion  . Sleep apnea    cpap used nightly x 2 yrs now.    Past Surgical History:  Procedure Laterality Date  . BREAST BIOPSY Left 1987  . BREAST LUMPECTOMY WITH RADIOACTIVE SEED AND SENTINEL LYMPH NODE BIOPSY Left 12/25/2016   Procedure: BREAST LUMPECTOMY WITH RADIOACTIVE SEED AND SENTINEL LYMPH NODE BIOPSY;  Surgeon: [name], MD;  Location: [facility];  Service: General;  Laterality: Left;  . CARDIAC CATHETERIZATION  06/02/2014   LAD 30%, D1 80%, CFX 755, RCA > 95% ISR, rx w/ PTCA, heavy calcification, EF 65%  . CORONARY ARTERY BYPASS GRAFT N/A 06/14/2014   Procedure: CORONARY ARTERY BYPASS GRAFTING (CABG);  Surgeon: Gaye Pollack, MD;  Location: [facility];  Service: Open Heart Surgery;  Laterality: N/A;  Times 2 using endoscopically harvested  right saphenous vein.  . coronary stents     x3 stents placed-prior ablation  . DILATION AND CURETTAGE OF UTERUS    . ELBOW FRACTURE SURGERY Left   . INCONTINENCE SURGERY     sling  . KNEE ARTHROSCOPY Right 02/24/2014   Procedure: RIGHT KNEE ARTHROSCOPY WITH DEBRIDEMENT ;  Surgeon: Gearlean Alf, MD;  Location: [facility];  Service: Orthopedics;  Laterality: Right;  . LEFT HEART CATHETERIZATION WITH CORONARY ANGIOGRAM N/A 06/02/2014   Procedure: LEFT HEART CATHETERIZATION WITH CORONARY ANGIOGRAM;  Surgeon: Sinclair Grooms, MD;  Location: [facility] CATH [facility];  Service: Cardiovascular;  Laterality: N/A;  . TEE WITHOUT CARDIOVERSION N/A 06/14/2014   Procedure: TRANSESOPHAGEAL ECHOCARDIOGRAM (TEE);  Surgeon: Gaye Pollack, MD;  Location: [facility];  Service: Open Heart Surgery;  Laterality: N/A;    Current Medications: Current Meds  Medication  Sig  . amLODipine (NORVASC) 5 MG tablet Take 5 mg by mouth daily.   Marland Kitchen anastrozole (ARIMIDEX) 1 MG tablet Take 1 tablet (1 mg total) by mouth daily.  Marland Kitchen atorvastatin (LIPITOR) 10 MG tablet Take 10 mg by mouth every evening.   . cetirizine (ZYRTEC) 10 MG tablet Take 10 mg by mouth daily.  . cloNIDine (CATAPRES) 0.1 MG tablet Take 0.1 mg by mouth every evening.   . clopidogrel (PLAVIX) 75 MG tablet Take 75 mg by mouth daily.  . Coenzyme Q10 300 MG CAPS Take 300 mg by mouth 2 (two) times daily.  . iron polysaccharides (NIFEREX) 150 MG capsule Take 1 capsule (150 mg total) by mouth daily.  Marland Kitchen LANTUS SOLOSTAR 100 UNIT/ML Solostar Pen Inject 28 Units into the skin at bedtime.   Marland Kitchen levothyroxine (SYNTHROID, LEVOTHROID) 75 MCG tablet Take 75 mcg by mouth daily before breakfast.   . losartan-hydrochlorothiazide (HYZAAR) 100-25 MG tablet Take 1 tablet by mouth daily.  . metoprolol (LOPRESSOR) 50 MG tablet Take 1 tablet (50 mg total) by mouth 2 (two) times daily.  . Multiple Vitamin (MULTIVITAMIN WITH MINERALS) TABS tablet Take 1 tablet by mouth at bedtime.  Marland Kitchen NITROSTAT 0.4 MG SL tablet Take 0.4 mg by mouth every 5 (five) minutes as needed for chest pain.   . pantoprazole (PROTONIX) 40 MG tablet Take 1 tablet (40 mg total) by mouth daily before breakfast.  . polyethylene glycol (MIRALAX / GLYCOLAX) packet Take 17 g by mouth daily.  . potassium chloride SA (K-DUR,KLOR-CON) 20 MEQ tablet Take 20 mEq by mouth daily.  . sitaGLIPtin-metformin (JANUMET) 50-1000 MG per tablet Take 1 tablet by mouth 2 (two) times daily. Hold for 48 hours, restart on 06/05/2014 (Patient taking differently: Take 1 tablet by mouth 2 (two) times daily. )  . [DISCONTINUED] Hydrocortisone (MGNOIBBCW-88 EX) Apply 1 application topically 2 (two) times daily as needed (for dry/itchy affected area(s) of skin).     Allergies:   Asa [aspirin]; Compazine [prochlorperazine edisylate]; Nsaids; Shellfish allergy; Sulfa antibiotics; Zantac  [ranitidine hcl]; and Lisinopril   Social History   Social History  . Marital status: Married    Spouse name: N/A  . Number of children: N/A  . Years of education: N/A   Social History Main Topics  . Smoking status: Never Smoker  . Smokeless tobacco: Never Used  . Alcohol use No  . Drug use: No  . Sexual activity: Not Currently   Other Topics Concern  . None   Social History Narrative  . None     Family History: The patient's family history includes Breast cancer (age of onset: 71)  in her cousin; Diabetes in her mother; Healthy in her brother; Heart attack in her mother; Hodgkin's lymphoma in her maternal uncle; Hypertension in her mother; Lung cancer in her brother; Ulcerative colitis in her daughter.  ROS:   Please see the history of present illness.    ROS  All other systems reviewed and negative.   EKGs/Labs/Other Studies Reviewed:    The following studies were reviewed today: none  EKG:  EKG is not ordered today.    Recent Labs: 02/19/2017: ALT 14; HGB 9.9; Platelets 367 05/07/2017: BUN 10; Creatinine, Ser 0.71; Potassium 3.9; Sodium 129   Recent Lipid Panel    Component Value Date/Time   CHOL  08/13/2007 0615    116        ATP III CLASSIFICATION:  <200     mg/dL   Desirable  200-239  mg/dL   Borderline High  >=240    mg/dL   High   TRIG 69 08/13/2007 0615   HDL 41 08/13/2007 0615   CHOLHDL 2.8 08/13/2007 0615   VLDL 14 08/13/2007 0615   LDLCALC  08/13/2007 0615    61        Total Cholesterol/HDL:CHD Risk Coronary Heart Disease Risk Table                     Men   Women  1/2 Average Risk   3.4   3.3    Physical Exam:    VS:  BP 126/64   Pulse 71   Ht 5' 2"  (1.575 m)   Wt 157 lb 6.4 oz (71.4 kg)   SpO2 93%   BMI 28.79 kg/m     Wt Readings from Last 3 Encounters:  05/16/17 157 lb 6.4 oz (71.4 kg)  04/23/17 155 lb 12.8 oz (70.7 kg)  04/05/17 156 lb (70.8 kg)     GEN:  Well nourished, well developed in no acute distress HEENT:  Normal NECK: No JVD; No carotid bruits LYMPHATICS: No lymphadenopathy CARDIAC: RRR, no murmurs, rubs, gallops RESPIRATORY:  Clear to auscultation without rales, wheezing or rhonchi  ABDOMEN: Soft, non-tender, non-distended MUSCULOSKELETAL:  No edema; No deformity  SKIN: Warm and dry NEUROLOGIC:  Alert and oriented x 3 PSYCHIATRIC:  Normal affect  ASSESSMENT:    1. OSA (obstructive sleep apnea)   2. Essential hypertension    PLAN:    In order of problems listed above:  1.  OSA - the patient is tolerating PAP therapy well without any problems although she had a hard time getting back on it since her breast surgery. The PAP download was reviewed today and showed an AHI of 0.8/hr on 14 cm H2O with 70% compliance in using more than 4 hours nightly.  The patient has been using and benefiting from CPAP use and will continue to benefit from therapy.   2. HTN - BP is very well controlled on exam today.  She will continue on amlodipine 50m daily, CLonidine 0.160m metoprolol 5027mID and Losartan HCT 100-66m13mily.      Medication Adjustments/Labs and Tests Ordered: Current medicines are reviewed at length with the patient today.  Concerns regarding medicines are outlined above.  No orders of the defined types were placed in this encounter.  No orders of the defined types were placed in this encounter.   Signed, TracFransico Him  05/16/2017 9:32 AM    [place]

## 2017-05-16 NOTE — Telephone Encounter (Addendum)
Per Dr Radford Pax.Marland KitchenMarland KitchenResmed Airfit P20 mask with chin strap get D/L in 4 weeks  Resmed CPAP at 14cm H20 with heated humidity ordered.  [place] notified

## 2017-05-17 ENCOUNTER — Telehealth: Payer: Self-pay | Admitting: Nurse Practitioner

## 2017-05-17 NOTE — Telephone Encounter (Signed)
Follow Up:   Returning your call,,concerning her lab results.

## 2017-05-21 NOTE — Telephone Encounter (Signed)
Follow Up:    Returning your call from [place] her lab results. She said she will not be at home from 1:30 until 3:30.if not there,you can leave a message please.

## 2017-05-24 ENCOUNTER — Telehealth: Payer: Self-pay | Admitting: Hematology and Oncology

## 2017-05-24 ENCOUNTER — Ambulatory Visit (HOSPITAL_BASED_OUTPATIENT_CLINIC_OR_DEPARTMENT_OTHER): Payer: Medicare Other | Admitting: Adult Health

## 2017-05-24 ENCOUNTER — Encounter: Payer: Self-pay | Admitting: Adult Health

## 2017-05-24 VITALS — BP 141/65 | HR 76 | Temp 98.4°F | Resp 16 | Ht 62.0 in | Wt 155.8 lb

## 2017-05-24 DIAGNOSIS — C50412 Malignant neoplasm of upper-outer quadrant of left female breast: Secondary | ICD-10-CM

## 2017-05-24 DIAGNOSIS — Z170 Estrogen receptor positive status [ER+]: Secondary | ICD-10-CM | POA: Diagnosis not present

## 2017-05-24 NOTE — Progress Notes (Addendum)
CLINIC:  Survivorship   REASON FOR VISIT:  Routine follow-up post-treatment for a recent history of breast cancer.  BRIEF ONCOLOGIC HISTORY:    Malignant neoplasm of upper-outer quadrant of left breast in female, estrogen receptor positive ([place])   10/01/2016 Initial Diagnosis    Left breast biopsy: 12:30: IDC with DCIS involving underlying papillary lesion, grade 1, ER 100%, PR 90%, Ki-67 10%, HER-2 negative ratio 1.29; irregular 1.9 cm mass axilla negative, T1 CN 0 stage IA clinical stage      12/25/2016 Surgery    Left lumpectomy: IDC grade 1, 1.5 cm, low-grade DCIS, resection margins negative, 0/2 lymph nodes negative, ER 100%, PR 100%, HER-2 negative ratio 1.29, Ki-67 10% T1 cN0 stage IA      12/25/2016 Oncotype testing    Oncotype DX score 2: Risk of recurrence 4%      01/30/2017 - 02/21/2017 Radiation Therapy    Adjuvant radiation therapy      02/2017 -  Anti-estrogen oral therapy    Anastrozole daily      03/25/2017 Genetic Testing    Genetic counseling and testing for hereditary cancer syndromes performed on 02/19/2017. Results are negative for pathogenic mutations in 46 genes analyzed by [employer]'s Common Hereditary Cancers Panel. Results are dated 03/25/2017. Genes tested: APC, ATM, AXIN2, BARD1, BMPR1A, BRCA1, BRCA2, BRIP1, CDH1, CDKN2A, CHEK2, CTNNA1, DICER1, EPCAM, GREM1, HOXB13, KIT, MEN1, MLH1, MSH2, MSH3, MSH6, MUTYH, NBN, NF1, NTHL1, PALB2, PDGFRA, PMS2, POLD1, POLE, PTEN, RAD50, RAD51C, RAD51D, SDHA, SDHB, SDHC, SDHD, SMAD4, SMARCA4, STK11, TP53, TSC1, TSC2, and VHL.          INTERVAL HISTORY:  Susan Barnett presents to the [facility] today for our initial meeting to review her survivorship care plan detailing her treatment course for breast cancer, as well as monitoring long-term side effects of that treatment, education regarding health maintenance, screening, and overall wellness and health promotion.     Overall, Susan Barnett reports feeling quite  well.  She is achier than usual.  She is taking Anastrozole daily.  She also takes Lipitor, and stopped for a couple of weeks and her aching improving.  She is having some stomach issues.  She is seeing GI and has a colonoscopy planned soon.     REVIEW OF SYSTEMS:  Review of Systems  Constitutional: Negative for appetite change, chills, diaphoresis, fatigue, fever and unexpected weight change.  HENT:   Negative for hearing loss and lump/mass.   Eyes: Negative for eye problems and icterus.  Respiratory: Negative for chest tightness, cough and shortness of breath.   Cardiovascular: Negative for chest pain, leg swelling and palpitations.  Gastrointestinal: Negative for abdominal distention and abdominal pain.  Endocrine: Negative for hot flashes.  Genitourinary: Negative for difficulty urinating.   Musculoskeletal: Negative for arthralgias.  Skin: Negative for itching.  Neurological: Negative for dizziness, extremity weakness, headaches and numbness.  Hematological: Negative for adenopathy. Does not bruise/bleed easily.  Psychiatric/Behavioral: Negative for depression. The patient is not nervous/anxious.   Breast: Denies any new nodularity, masses, tenderness, nipple changes, or nipple discharge.      ONCOLOGY TREATMENT TEAM:  1. Surgeon:  Dr. [name] at [facility] 2. Medical Oncologist: Dr. Lindi Adie  3. Radiation Oncologist: Dr. Isidore Moos    PAST MEDICAL/SURGICAL HISTORY:  Past Medical History:  Diagnosis Date  . Anemia    mild  . Anginal pain ([place])    with exertion, last time prior to cardiac caht 06/02/14  . Anxiety    situational  . Arthritis   .  CAD (coronary artery disease)    Dr. Linard Millers follows, no problems now  . Complication of anesthesia   . Diabetes mellitus, type 2 ([place])    TYPE 2  . Dysrhythmia    occ skips a beat, rate was fast before beta blocker.  . Gastric polyp   . Genetic testing 04/12/2017   Susan Barnett underwent genetic counseling and  testing for hereditary cancer syndromes on 02/19/2017. Her results were negative for mutations in all 46 genes analyzed by [facility]'s 46-gene Common Hereditary Cancers Panel. Genes analyzed include: APC, ATM, AXIN2, BARD1, BMPR1A, BRCA1, BRCA2, BRIP1, CDH1, CDKN2A, CHEK2, CTNNA1, DICER1, EPCAM, GREM1, HOXB13, KIT, MEN1, MLH1, MSH2, MSH3, MSH6, MUTYH, NBN  . GERD (gastroesophageal reflux disease)   . Headache    hx of  . Heartburn   . Hiatal hernia    mild head tremors  . History of radiation therapy 01/30/17- 02/21/17   Left Breast 42.56 Gy in 16 fractions  . Hypertension   . Hypothyroidism   . Occasional tremors    "of head", slight hands  . Pneumonia   . PONV (postoperative nausea and vomiting) 02/24/14  . Shortness of breath    with exertion  . Sleep apnea    cpap used nightly x 2 yrs now.   Past Surgical History:  Procedure Laterality Date  . BREAST BIOPSY Left 1987  . BREAST LUMPECTOMY WITH RADIOACTIVE SEED AND SENTINEL LYMPH NODE BIOPSY Left 12/25/2016   Procedure: BREAST LUMPECTOMY WITH RADIOACTIVE SEED AND SENTINEL LYMPH NODE BIOPSY;  Surgeon: [name], MD;  Location: [facility];  Service: General;  Laterality: Left;  . CARDIAC CATHETERIZATION  06/02/2014   LAD 30%, D1 80%, CFX 755, RCA > 95% ISR, rx w/ PTCA, heavy calcification, EF 65%  . CORONARY ARTERY BYPASS GRAFT N/A 06/14/2014   Procedure: CORONARY ARTERY BYPASS GRAFTING (CABG);  Surgeon: Gaye Pollack, MD;  Location: [facility];  Service: Open Heart Surgery;  Laterality: N/A;  Times 2 using endoscopically harvested right saphenous vein.  . coronary stents     x3 stents placed-prior ablation  . DILATION AND CURETTAGE OF UTERUS    . ELBOW FRACTURE SURGERY Left   . INCONTINENCE SURGERY     sling  . KNEE ARTHROSCOPY Right 02/24/2014   Procedure: RIGHT KNEE ARTHROSCOPY WITH DEBRIDEMENT ;  Surgeon: Gearlean Alf, MD;  Location: [facility];  Service: Orthopedics;  Laterality: Right;  . LEFT HEART CATHETERIZATION WITH CORONARY  ANGIOGRAM N/A 06/02/2014   Procedure: LEFT HEART CATHETERIZATION WITH CORONARY ANGIOGRAM;  Surgeon: Sinclair Grooms, MD;  Location: [facility];  Service: Cardiovascular;  Laterality: N/A;  . TEE WITHOUT CARDIOVERSION N/A 06/14/2014   Procedure: TRANSESOPHAGEAL ECHOCARDIOGRAM (TEE);  Surgeon: Gaye Pollack, MD;  Location: [facility];  Service: Open Heart Surgery;  Laterality: N/A;     ALLERGIES:  Allergies  Allergen Reactions  . Asa [Aspirin] Anaphylaxis  . Compazine [Prochlorperazine Edisylate] Swelling and Other (See Comments)    Tongue swelling   . Nsaids Hives and Swelling  . Shellfish Allergy Hives and Swelling  . Sulfa Antibiotics Hives and Swelling  . Zantac [Ranitidine Hcl] Hives and Swelling  . Lisinopril Cough     CURRENT MEDICATIONS:  Outpatient Encounter Prescriptions as of 05/24/2017  Medication Sig  . amLODipine (NORVASC) 5 MG tablet Take 5 mg by mouth daily.   Marland Kitchen anastrozole (ARIMIDEX) 1 MG tablet Take 1 tablet (1 mg total) by mouth daily.  Marland Kitchen atorvastatin (LIPITOR) 10 MG tablet Take 10  mg by mouth every evening.   . cetirizine (ZYRTEC) 10 MG tablet Take 10 mg by mouth daily.  . cloNIDine (CATAPRES) 0.1 MG tablet Take 0.1 mg by mouth every evening.   . clopidogrel (PLAVIX) 75 MG tablet Take 75 mg by mouth daily.  . Coenzyme Q10 300 MG CAPS Take 300 mg by mouth 2 (two) times daily.  . iron polysaccharides (NIFEREX) 150 MG capsule Take 1 capsule (150 mg total) by mouth daily.  Marland Kitchen LANTUS SOLOSTAR 100 UNIT/ML Solostar Pen Inject 28 Units into the skin at bedtime.   Marland Kitchen levothyroxine (SYNTHROID, LEVOTHROID) 75 MCG tablet Take 75 mcg by mouth daily before breakfast.   . losartan-hydrochlorothiazide (HYZAAR) 100-25 MG tablet Take 1 tablet by mouth daily.  . metoprolol (LOPRESSOR) 50 MG tablet Take 1 tablet (50 mg total) by mouth 2 (two) times daily.  . Multiple Vitamin (MULTIVITAMIN WITH MINERALS) TABS tablet Take 1 tablet by mouth at bedtime.  Marland Kitchen NITROSTAT 0.4 MG SL tablet Take  0.4 mg by mouth every 5 (five) minutes as needed for chest pain.   . pantoprazole (PROTONIX) 40 MG tablet Take 1 tablet (40 mg total) by mouth daily before breakfast.  . polyethylene glycol (MIRALAX / GLYCOLAX) packet Take 17 g by mouth daily.  . potassium chloride SA (K-DUR,KLOR-CON) 20 MEQ tablet Take 20 mEq by mouth daily.  . sitaGLIPtin-metformin (JANUMET) 50-1000 MG per tablet Take 1 tablet by mouth 2 (two) times daily. Hold for 48 hours, restart on 06/05/2014 (Patient taking differently: Take 1 tablet by mouth 2 (two) times daily. )   No facility-administered encounter medications on file as of 05/24/2017.      ONCOLOGIC FAMILY HISTORY:  Family History  Problem Relation Age of Onset  . Heart attack Mother   . Hypertension Mother   . Diabetes Mother   . Lung cancer Brother        d.57 from metastatic disease. History of smoking.  Marland Kitchen Healthy Brother   . Breast cancer Cousin 30       d.30s  . Hodgkin's lymphoma Maternal Uncle        d.62s  . Ulcerative colitis Daughter      GENETIC COUNSELING/TESTING: See above  SOCIAL HISTORY:  Susan Barnett is married and lives with her husband in [place], [place].  She has 3 children and they live in [place], [place].  Susan Barnett is currently retired and keeps her grandchildren after school.  She denies any current or history of tobacco, alcohol, or illicit drug use.     PHYSICAL EXAMINATION:  Vital Signs:   Vitals:   05/24/17 0951  BP: (!) 141/65  Pulse: 76  Resp: 16  Temp: 98.4 F (36.9 C)  SpO2: 99%   Filed Weights   05/24/17 0951  Weight: 155 lb 12.8 oz (70.7 kg)   General: Well-nourished, well-appearing female in no acute distress.  She is unaccompanied today.   HEENT: Head is normocephalic.  Pupils equal and reactive to light. Conjunctivae clear without exudate.  Sclerae anicteric. Oral mucosa is pink, moist.  Oropharynx is pink without lesions or erythema.  Lymph: No cervical, supraclavicular, or  infraclavicular lymphadenopathy noted on palpation.  Cardiovascular: Regular rate and rhythm.Marland Kitchen Respiratory: Clear to auscultation bilaterally. Chest expansion symmetric; breathing non-labored.  GI: Abdomen soft and round; non-tender, non-distended. Bowel sounds normoactive.  GU: Deferred.  Neuro: No focal deficits. Steady gait.  Psych: Mood and affect normal and appropriate for situation.  Extremities: No edema. MSK: No focal spinal  tenderness to palpation.  Full range of motion in bilateral upper extremities Skin: Warm and dry.  LABORATORY DATA:  None for this visit.  DIAGNOSTIC IMAGING:  None for this visit.      ASSESSMENT AND PLAN:  Susan Barnett is a pleasant 71 y.o. female with Stage IA left breast invasive ductal carcinoma, ER+/PR+/HER2-, diagnosed in 12/2016, treated with lumpectomy, adjuvant radiation therapy, and anti-estrogen therapy with Anastrozole beginning in 02/2017.  She presents to the Survivorship Clinic for our initial meeting and routine follow-up post-completion of treatment for breast cancer.    1. Stage IA left breast cancer:  Susan Barnett is continuing to recover from definitive treatment for breast cancer. She will follow-up with her medical oncologist, Dr. Lindi Adie in 11/2017 with history and physical exam per surveillance protocol.  She will continue her anti-estrogen therapy with Anastrozole. Thus far, she is tolerating the Anastrozole well, with minimal side effects. She was instructed to make Dr. Lindi Adie or myself aware if she begins to experience any worsening side effects of the medication and I could see her back in clinic to help manage those side effects, as needed. Today, a comprehensive survivorship care plan and treatment summary was reviewed with the patient today detailing her breast cancer diagnosis, treatment course, potential late/long-term effects of treatment, appropriate follow-up care with recommendations for the future, and patient education resources.   A copy of this summary, along with a letter will be sent to the patient's primary care provider via mail/fax/In Basket message after today's visit.    2. Bone health:  Given Susan Barnett age/history of breast cancer and her current treatment regimen including anti-estrogen therapy with Anastrozole, she is at risk for bone demineralization.  She has undergone bone density testing, but cannot remember when this was.  I will get the patient to sign a release, and get a copy of the results from [facility] and order if necessary.  In the meantime, she was encouraged to increase her consumption of foods rich in calcium, as well as increase her weight-bearing activities.  She was given education on specific activities to promote bone health.  3. Cancer screening:  Due to Susan Barnett history and her age, she should receive screening for skin cancers, colon cancer, and gynecologic cancers.  The information and recommendations are listed on the patient's comprehensive care plan/treatment summary and were reviewed in detail with the patient.    4. Health maintenance and wellness promotion: Susan Barnett was encouraged to consume 5-7 servings of fruits and vegetables per day. We reviewed the "Nutrition Rainbow" handout, as well as the handout "Take Control of Your Health and Reduce Your Cancer Risk" from the [place].  She was also encouraged to engage in moderate to vigorous exercise for 30 minutes per day most days of the week. We discussed the [employer] fitness program, which is designed for cancer survivors to help them become more physically fit after cancer treatments.  She was instructed to limit her alcohol consumption and continue to abstain from tobacco use.     5. Support services/counseling: It is not uncommon for this period of the patient's cancer care trajectory to be one of many emotions and stressors.  We discussed an opportunity for her to participate in the next session of [facility]  ("Finding Your New Normal") support group series designed for patients after they have completed treatment.   Susan Barnett was encouraged to take advantage of our many other support services programs, support groups, and/or counseling in  coping with her new life as a cancer survivor after completing anti-cancer treatment.  She was offered support today through active listening and expressive supportive counseling.  She was given information regarding our available services and encouraged to contact me with any questions or for help enrolling in any of our support group/programs.    Dispo:   -Return to cancer center for follow up with Dr. Lindi Adie in 11/2017   -Mammogram due in 09/2017 -Bone density--will get results from [facility] -Follow up with Dr. [name] in 05/2017 -She is welcome to return back to the Survivorship Clinic at any time; no additional follow-up needed at this time.  -Consider referral back to survivorship as a long-term survivor for continued surveillance  A total of (30) minutes of face-to-face time was spent with this patient with greater than 50% of that time in counseling and care-coordination.   Gardenia Phlegm, NP Survivorship Program [facility] [phone]   Note: PRIMARY CARE PROVIDER Shirline Frees, [place] [phone]  ADDENDUM: Received normal bone density tests from 08/2016 done with Dr. Shirline Frees at [facility], results sent to scan.

## 2017-05-24 NOTE — Telephone Encounter (Signed)
Gave patient avs and calendar with appts per 10/12 los.

## 2017-05-27 ENCOUNTER — Telehealth: Payer: Self-pay

## 2017-05-27 ENCOUNTER — Other Ambulatory Visit (INDEPENDENT_AMBULATORY_CARE_PROVIDER_SITE_OTHER): Payer: Medicare Other

## 2017-05-27 ENCOUNTER — Encounter: Payer: Self-pay | Admitting: Internal Medicine

## 2017-05-27 ENCOUNTER — Ambulatory Visit (AMBULATORY_SURGERY_CENTER): Payer: Medicare Other | Admitting: Internal Medicine

## 2017-05-27 ENCOUNTER — Other Ambulatory Visit: Payer: Self-pay

## 2017-05-27 ENCOUNTER — Encounter: Payer: Medicare Other | Admitting: Internal Medicine

## 2017-05-27 VITALS — BP 143/60 | HR 64 | Temp 96.8°F | Resp 13 | Ht 62.0 in | Wt 156.0 lb

## 2017-05-27 DIAGNOSIS — D509 Iron deficiency anemia, unspecified: Secondary | ICD-10-CM

## 2017-05-27 DIAGNOSIS — K2100 Gastro-esophageal reflux disease with esophagitis, without bleeding: Secondary | ICD-10-CM

## 2017-05-27 DIAGNOSIS — K219 Gastro-esophageal reflux disease without esophagitis: Secondary | ICD-10-CM | POA: Diagnosis not present

## 2017-05-27 DIAGNOSIS — K222 Esophageal obstruction: Secondary | ICD-10-CM | POA: Diagnosis not present

## 2017-05-27 DIAGNOSIS — R1310 Dysphagia, unspecified: Secondary | ICD-10-CM

## 2017-05-27 DIAGNOSIS — D125 Benign neoplasm of sigmoid colon: Secondary | ICD-10-CM | POA: Diagnosis not present

## 2017-05-27 DIAGNOSIS — D120 Benign neoplasm of cecum: Secondary | ICD-10-CM

## 2017-05-27 DIAGNOSIS — E119 Type 2 diabetes mellitus without complications: Secondary | ICD-10-CM | POA: Diagnosis not present

## 2017-05-27 LAB — CBC WITH DIFFERENTIAL/PLATELET
Basophils Absolute: 0.1 10*3/uL (ref 0.0–0.1)
Basophils Relative: 0.5 % (ref 0.0–3.0)
Eosinophils Absolute: 0.4 10*3/uL (ref 0.0–0.7)
Eosinophils Relative: 4.3 % (ref 0.0–5.0)
HCT: 35.3 % — ABNORMAL LOW (ref 36.0–46.0)
Hemoglobin: 11.1 g/dL — ABNORMAL LOW (ref 12.0–15.0)
Lymphocytes Relative: 16.1 % (ref 12.0–46.0)
Lymphs Abs: 1.6 10*3/uL (ref 0.7–4.0)
MCHC: 31.5 g/dL (ref 30.0–36.0)
MCV: 74.1 fl — ABNORMAL LOW (ref 78.0–100.0)
Monocytes Absolute: 0.8 10*3/uL (ref 0.1–1.0)
Monocytes Relative: 7.6 % (ref 3.0–12.0)
Neutro Abs: 7.2 10*3/uL (ref 1.4–7.7)
Neutrophils Relative %: 71.5 % (ref 43.0–77.0)
Platelets: 377 10*3/uL (ref 150.0–400.0)
RBC: 4.77 Mil/uL (ref 3.87–5.11)
RDW: 18.5 % — ABNORMAL HIGH (ref 11.5–15.5)
WBC: 10.1 10*3/uL (ref 4.0–10.5)

## 2017-05-27 LAB — FERRITIN: Ferritin: 9.2 ng/mL — ABNORMAL LOW (ref 10.0–291.0)

## 2017-05-27 MED ORDER — SODIUM CHLORIDE 0.9 % IV SOLN: 500.0000 mL | INTRAVENOUS | Status: DC

## 2017-05-27 NOTE — Patient Instructions (Addendum)
YOU HAD AN ENDOSCOPIC PROCEDURE TODAY AT [facility]:   Refer to the procedure report that was given to you for any specific questions about what was found during the examination.  If the procedure report does not answer your questions, please call your gastroenterologist to clarify.  If you requested that your care partner not be given the details of your procedure findings, then the procedure report has been included in a sealed envelope for you to review at your convenience later.  YOU SHOULD EXPECT: Some feelings of bloating in the abdomen. Passage of more gas than usual.  Walking can help get rid of the air that was put into your GI tract during the procedure and reduce the bloating. If you had a lower endoscopy (such as a colonoscopy or flexible sigmoidoscopy) you may notice spotting of blood in your stool or on the toilet paper. If you underwent a bowel prep for your procedure, you may not have a normal bowel movement for a few days.  Please Note:  You might notice some irritation and congestion in your nose or some drainage.  This is from the oxygen used during your procedure.  There is no need for concern and it should clear up in a day or so.  SYMPTOMS TO REPORT IMMEDIATELY:   Following lower endoscopy (colonoscopy or flexible sigmoidoscopy):  Excessive amounts of blood in the stool  Significant tenderness or worsening of abdominal pains  Swelling of the abdomen that is new, acute  Fever of 100F or higher   Following upper endoscopy (EGD)  Vomiting of blood or coffee ground material  New chest pain or pain under the shoulder blades  Painful or persistently difficult swallowing  New shortness of breath  Fever of 100F or higher  Black, tarry-looking stools  For urgent or emergent issues, a gastroenterologist can be reached at any hour by calling [phone].   DIET:  We do recommend clear liquids until 10 am.  After 10 you may have a soft diet fr the rest of  today.  Regular diet tomorrow.  Drink plenty of fluids but you should avoid alcoholic beverages for 24 hours.  ACTIVITY:  You should plan to take it easy for the rest of today and you should NOT DRIVE or use heavy machinery until tomorrow (because of the sedation medicines used during the test).    FOLLOW UP: Our staff will call the number listed on your records the next business day following your procedure to check on you and address any questions or concerns that you may have regarding the information given to you following your procedure. If we do not reach you, we will leave a message.  However, if you are feeling well and you are not experiencing any problems, there is no need to return our call.  We will assume that you have returned to your regular daily activities without incident.  If any biopsies were taken you will be contacted by phone or by letter within the next 1-3 weeks.  Please call us at [phone] if you have not heard about the biopsies in 3 weeks.    SIGNATURES/CONFIDENTIALITY: You and/or your care partner have signed paperwork which will be entered into your electronic medical record.  These signatures attest to the fact that that the information above on your After Visit Summary has been reviewed and is understood.  Full responsibility of the confidentiality of this discharge information lies with you and/or your care-partner.  Take your  iron as prescribed. Resume your plavix tomorrow.    The 3rd floor nurse will schedule your next appointment and call you.  You will go to the lab for bloodwork this am.

## 2017-05-27 NOTE — Telephone Encounter (Signed)
Ordered CBC and Ferritin per Dr. Henrene Pastor.  To be seen in office in 4 weeks.

## 2017-05-27 NOTE — Op Note (Signed)
[facility] Patient Name: Susan Barnett Procedure Date: 05/27/2017 7:43 AM MRN: [id number] Endoscopist: Docia Chuck. Henrene Pastor , MD Age: 71 Referring MD:  Date of Birth: 05/20/1946 Gender: Female Account #: [id number] Procedure:                Upper GI endoscopy, with biopsies, with Venia Minks                            dilation of the esophagus Indications:              Dysphagia, Unexplained iron deficiency anemia Medicines:                Monitored Anesthesia Care Procedure:                Pre-Anesthesia Assessment:                           - Prior to the procedure, a History and Physical                            was performed, and patient medications and                            allergies were reviewed. The patient's tolerance of                            previous anesthesia was also reviewed. The risks                            and benefits of the procedure and the sedation                            options and risks were discussed with the patient.                            All questions were answered, and informed consent                            was obtained. Prior Anticoagulants: The patient has                            taken Plavix (clopidogrel), last dose was 5 days                            prior to procedure. ASA Grade Assessment: III - A                            patient with severe systemic disease. After                            reviewing the risks and benefits, the patient was                            deemed in satisfactory condition to undergo the  procedure.                           After obtaining informed consent, the endoscope was                            passed under direct vision. Throughout the                            procedure, the patient's blood pressure, pulse, and                            oxygen saturations were monitored continuously. The                            Endoscope was introduced through the  mouth, and                            advanced to the third part of duodenum. The upper                            GI endoscopy was accomplished without difficulty.                            The patient tolerated the procedure well. Scope In: Scope Out: Findings:                 One mild benign-appearing, intrinsic stenosis was                            found 35 cm from the incisors. There was mild edema                            at the Z line. The scope was withdrawn. Dilation                            was performed with a Maloney dilator with no                            resistance at 52 Fr.                           The exam of the esophagus was otherwise normal.                           The stomach was normal, save small sliding hiatal                            hernia and several small benign fundic gland type                            polyps.                           The examined duodenum was normal. Biopsies  for                            histology were taken with a cold forceps for                            evaluation of celiac disease.                           The cardia and gastric fundus were normal on                            retroflexion. Complications:            No immediate complications. Estimated Blood Loss:     Estimated blood loss: none. Impression:               - Benign-appearing esophageal stenosis. Dilated.                           - Essentially Normal stomach.                           - Normal examined duodenum. Biopsied. Recommendation:           - Patient has a contact number available for                            emergencies. The signs and symptoms of potential                            delayed complications were discussed with the                            patient. Return to normal activities tomorrow.                            Written discharge instructions were provided to the                            patient.                            - Resume previous diet.                           - Continue present medications. Resume iron.                            Continue reflux medication.                           - Await pathology results.                           - Resume Plavix (clopidogrel) at prior dose today.                           -  CBC and ferritin level today                           - Please make a follow-up office appointment with                            Dr. Henrene Pastor in about 4 weeks Docia Chuck. Henrene Pastor, MD 05/27/2017 9:00:13 AM This report has been signed electronically.

## 2017-05-27 NOTE — Progress Notes (Signed)
Called to room to assist during endoscopic procedure.  Patient ID and intended procedure confirmed with present staff. Received instructions for my participation in the procedure from the performing physician.  

## 2017-05-27 NOTE — Progress Notes (Signed)
Report to PACU, RN, vss, BBS= Clear.  

## 2017-05-27 NOTE — Op Note (Signed)
[name] Procedure Date: 05/27/2017 7:43 AM MRN: [id number] Endoscopist: Docia Chuck. Henrene Pastor , MD Age: 71 Referring MD:  Date of Birth: Sep 22, 1945 Gender: Female Account #: [id number] Procedure:                Colonoscopy, with cold snare polypectomy x 2 Indications:              Iron deficiency anemia. Negative examination 2008 Medicines:                Monitored Anesthesia Care Procedure:                Pre-Anesthesia Assessment:                           - Prior to the procedure, a History and Physical                            was performed, and patient medications and                            allergies were reviewed. The patient's tolerance of                            previous anesthesia was also reviewed. The risks                            and benefits of the procedure and the sedation                            options and risks were discussed with the patient.                            All questions were answered, and informed consent                            was obtained. Prior Anticoagulants: The patient has                            taken Plavix (clopidogrel), last dose was 5 days                            prior to procedure. ASA Grade Assessment: III - A                            patient with severe systemic disease. After                            reviewing the risks and benefits, the patient was                            deemed in satisfactory condition to undergo the                            procedure.  After obtaining informed consent, the colonoscope                            was passed under direct vision. Throughout the                            procedure, the patient's blood pressure, pulse, and                            oxygen saturations were monitored continuously. The                            Colonoscope was introduced through the anus and                            advanced to  the the cecum, identified by                            appendiceal orifice and ileocecal valve. The                            terminal ileum, ileocecal valve, appendiceal                            orifice, and rectum were photographed. The quality                            of the bowel preparation was excellent. The                            colonoscopy was performed without difficulty. The                            patient tolerated the procedure well. The bowel                            preparation used was SUPREP. Scope In: 8:21:25 AM Scope Out: 8:37:59 AM Scope Withdrawal Time: 0 hours 14 minutes 20 seconds  Total Procedure Duration: 0 hours 16 minutes 34 seconds  Findings:                 The terminal ileum appeared normal.                           Two polyps were found in the sigmoid colon and                            cecum. The polyps were 3 mm in size. These polyps                            were removed with a cold snare. Resection and                            retrieval were complete.  The exam was otherwise without abnormality on                            direct and retroflexion views. Complications:            No immediate complications. Estimated blood loss:                            None. Estimated Blood Loss:     Estimated blood loss: none. Impression:               - The examined portion of the ileum was normal.                           - Two 3 mm polyps in the sigmoid colon and in the                            cecum, removed with a cold snare. Resected and                            retrieved.                           - The examination was otherwise normal on direct                            and retroflexion views. Recommendation:           - Repeat colonoscopy in 5 years for surveillance.                           - Resume Plavix (clopidogrel) today at prior dose.                           - Patient has a contact number  available for                            emergencies. The signs and symptoms of potential                            delayed complications were discussed with the                            patient. Return to normal activities tomorrow.                            Written discharge instructions were provided to the                            patient.                           - Resume previous diet.                           - Continue present medications.                           -  Await pathology results. Docia Chuck. Henrene Pastor, MD 05/27/2017 8:44:31 AM This report has been signed electronically.

## 2017-05-28 ENCOUNTER — Telehealth: Payer: Self-pay | Admitting: *Deleted

## 2017-05-28 ENCOUNTER — Encounter: Payer: Self-pay | Admitting: *Deleted

## 2017-05-28 NOTE — Telephone Encounter (Signed)
  Follow up Call-  Call back number 05/27/2017  Post procedure Call Back phone  # [phone]  Some recent data might be hidden     Patient questions:  Do you have a fever, pain , or abdominal swelling? Yes,throat a little sore Pain Score  1 *  Have you tolerated food without any problems? Yes.    Have you been able to return to your normal activities? Yes.    Do you have any questions about your discharge instructions: Diet   No. Medications  No. Follow up visit  No.  Do you have questions or concerns about your Care? No.  Actions: * If pain score is 4 or above: No action needed, pain <4.

## 2017-05-28 NOTE — Telephone Encounter (Signed)
See result note.  

## 2017-05-28 NOTE — Telephone Encounter (Signed)
Patient states she is good except for her throat being a little sore, but no concerns.

## 2017-05-29 ENCOUNTER — Other Ambulatory Visit: Payer: Self-pay

## 2017-05-29 MED ORDER — POLYSACCHARIDE IRON COMPLEX 150 MG PO CAPS: 150.0000 mg | ORAL_CAPSULE | Freq: Three times a day (TID) | ORAL | 3 refills | Status: DC

## 2017-05-30 ENCOUNTER — Encounter: Payer: Self-pay | Admitting: Internal Medicine

## 2017-05-31 ENCOUNTER — Telehealth: Payer: Self-pay | Admitting: Internal Medicine

## 2017-06-03 DIAGNOSIS — N08 Glomerular disorders in diseases classified elsewhere: Secondary | ICD-10-CM | POA: Diagnosis not present

## 2017-06-03 DIAGNOSIS — R1013 Epigastric pain: Secondary | ICD-10-CM | POA: Diagnosis not present

## 2017-06-03 DIAGNOSIS — E1129 Type 2 diabetes mellitus with other diabetic kidney complication: Secondary | ICD-10-CM | POA: Diagnosis not present

## 2017-06-03 DIAGNOSIS — D509 Iron deficiency anemia, unspecified: Secondary | ICD-10-CM | POA: Diagnosis not present

## 2017-06-03 DIAGNOSIS — E781 Pure hyperglyceridemia: Secondary | ICD-10-CM | POA: Diagnosis not present

## 2017-06-03 DIAGNOSIS — G4733 Obstructive sleep apnea (adult) (pediatric): Secondary | ICD-10-CM | POA: Diagnosis not present

## 2017-06-03 DIAGNOSIS — E1142 Type 2 diabetes mellitus with diabetic polyneuropathy: Secondary | ICD-10-CM | POA: Diagnosis not present

## 2017-06-03 MED ORDER — POLYSACCHARIDE IRON COMPLEX 150 MG PO CAPS: 150.0000 mg | ORAL_CAPSULE | Freq: Three times a day (TID) | ORAL | 3 refills | Status: DC

## 2017-06-03 NOTE — Telephone Encounter (Signed)
Sent iron to [facility]

## 2017-06-07 DIAGNOSIS — G4733 Obstructive sleep apnea (adult) (pediatric): Secondary | ICD-10-CM | POA: Diagnosis not present

## 2017-06-11 ENCOUNTER — Encounter: Payer: Self-pay | Admitting: Nurse Practitioner

## 2017-06-11 ENCOUNTER — Ambulatory Visit (INDEPENDENT_AMBULATORY_CARE_PROVIDER_SITE_OTHER): Payer: Medicare Other | Admitting: Nurse Practitioner

## 2017-06-11 VITALS — BP 128/70 | HR 67 | Ht 62.0 in | Wt 156.8 lb

## 2017-06-11 DIAGNOSIS — I10 Essential (primary) hypertension: Secondary | ICD-10-CM

## 2017-06-11 DIAGNOSIS — I259 Chronic ischemic heart disease, unspecified: Secondary | ICD-10-CM

## 2017-06-11 LAB — BASIC METABOLIC PANEL
BUN/Creatinine Ratio: 16 (ref 12–28)
BUN: 11 mg/dL (ref 8–27)
CO2: 27 mmol/L (ref 20–29)
Calcium: 9.8 mg/dL (ref 8.7–10.3)
Chloride: 92 mmol/L — ABNORMAL LOW (ref 96–106)
Creatinine, Ser: 0.67 mg/dL (ref 0.57–1.00)
GFR calc Af Amer: 102 mL/min/{1.73_m2} (ref >59)
GFR calc non Af Amer: 88 mL/min/{1.73_m2} (ref >59)
Glucose: 169 mg/dL — ABNORMAL HIGH (ref 65–99)
Potassium: 4.2 mmol/L (ref 3.5–5.2)
Sodium: 133 mmol/L — ABNORMAL LOW (ref 134–144)

## 2017-06-11 MED ORDER — NITROSTAT 0.4 MG SL SUBL: 0.4000 mg | SUBLINGUAL_TABLET | SUBLINGUAL | 3 refills | Status: DC | PRN

## 2017-06-11 NOTE — Patient Instructions (Addendum)
We will be checking the following labs today - BMET   Medication Instructions:    Continue with your current medicines.     Testing/Procedures To Be Arranged:  N/A  Follow-Up:   See Dr. Tamala Julian in March    Other Special Instructions:   N/A    If you need a refill on your cardiac medications before your next appointment, please call your pharmacy.   Call the [place] office at [phone] if you have any questions, problems or concerns.

## 2017-06-11 NOTE — Progress Notes (Signed)
CARDIOLOGY OFFICE NOTE  Date:  06/11/2017    [name] Date of Birth: 1946/03/06 Medical Record #[id number]  PCP:  Shirline Frees, MD  Cardiologist:  Jennings Books  Chief Complaint  Patient presents with  . Hypertension    Follow up visit - seen for Dr. Tamala Julian    History of Present Illness: Susan Barnett is a 71 y.o. female who presents today for a 1 month check. Seen for Dr. Tamala Julian.   She has know CAD with priorCABG x 2 for repeat in-stent restenosis of right coronary per Dr. Cyndia Bent in 2015, hypertension, diabetes mellitus, hyperlipidemia, hypothyroidism and obstructive sleep apnea. She had had BMS of the RCA in 2002 with restenosis treated with brachytherapy - then developed edge restenosis and was treated with DES - developed restenosis again between the stents treated with angioplasty and stenting with a long DES back in 2006.  She had not been seen since December of 2016 - felt to be doing well at that time.   Diagnosed with left sided breast cancer earlier this year.  She needed lumpectomy. I then saw her for a pre op clearance visit. Cardiac status was ok. Her angina equivalent is typically more shortness of breath.    Last seen by me last month - lots of issues. BP creeping up. Not active. Husband with some unknown illness. Not using her CPAP. Vivid dreams at night - to the point of hallucinating. Fortunately, no chest pain. BP was elevated and we increased her Hyzaar. Encouraged to get back on her CPAP. Also advised to see PCP.   Comes in today. Here alone. She is doing well. Still with lots of issues. Has had her colonoscopy for follow up and for her anemia - this turned out ok. Her iron was increased - caused lots of belly pain - had to cut back. Her aching was perhaps better off Lipitor - but she went back on and does not wish to change. She had had some shots in her knee - that "has finally kicked in". BP is much better. No chest pain. Overall, seems  better.   Past Medical History:  Diagnosis Date  . Anemia    mild  . Anginal pain ([place])    with exertion, last time prior to cardiac caht 06/02/14  . Anxiety    situational  . Arthritis   . CAD (coronary artery disease)    Dr. Linard Millers follows, no problems now  . Cancer [facility])    Breast cancer  . Complication of anesthesia   . Diabetes mellitus, type 2 ([place])    TYPE 2  . Dysrhythmia    occ skips a beat, rate was fast before beta blocker.  . Gastric polyp   . Genetic testing 04/12/2017   Ms. Desena underwent genetic counseling and testing for hereditary cancer syndromes on 02/19/2017. Her results were negative for mutations in all 46 genes analyzed by [facility]'s 46-gene Common Hereditary Cancers Panel. Genes analyzed include: APC, ATM, AXIN2, BARD1, BMPR1A, BRCA1, BRCA2, BRIP1, CDH1, CDKN2A, CHEK2, CTNNA1, DICER1, EPCAM, GREM1, HOXB13, KIT, MEN1, MLH1, MSH2, MSH3, MSH6, MUTYH, NBN  . GERD (gastroesophageal reflux disease)   . Headache    hx of  . Heartburn   . Hiatal hernia    mild head tremors  . History of radiation therapy 01/30/17- 02/21/17   Left Breast 42.56 Gy in 16 fractions  . Hypertension   . Hypothyroidism   . Occasional tremors    "of  head", slight hands  . Osteopenia   . Pneumonia   . PONV (postoperative nausea and vomiting) 02/24/14  . Shortness of breath    with exertion  . Sleep apnea    cpap used nightly x 2 yrs now.    Past Surgical History:  Procedure Laterality Date  . BREAST BIOPSY Left 1987  . BREAST LUMPECTOMY WITH RADIOACTIVE SEED AND SENTINEL LYMPH NODE BIOPSY Left 12/25/2016   Procedure: BREAST LUMPECTOMY WITH RADIOACTIVE SEED AND SENTINEL LYMPH NODE BIOPSY;  Surgeon: [name], MD;  Location: [place];  Service: General;  Laterality: Left;  . CARDIAC CATHETERIZATION  06/02/2014   LAD 30%, D1 80%, CFX 755, RCA > 95% ISR, rx w/ PTCA, heavy calcification, EF 65%  . CORONARY ARTERY BYPASS GRAFT N/A 06/14/2014   Procedure: CORONARY ARTERY BYPASS  GRAFTING (CABG);  Surgeon: Gaye Pollack, MD;  Location: [place];  Service: Open Heart Surgery;  Laterality: N/A;  Times 2 using endoscopically harvested right saphenous vein.  . coronary stents     x3 stents placed-prior ablation  . DILATION AND CURETTAGE OF UTERUS    . ELBOW FRACTURE SURGERY Left   . INCONTINENCE SURGERY     sling  . KNEE ARTHROSCOPY Right 02/24/2014   Procedure: RIGHT KNEE ARTHROSCOPY WITH DEBRIDEMENT ;  Surgeon: Gearlean Alf, MD;  Location: [facility];  Service: Orthopedics;  Laterality: Right;  . LEFT HEART CATHETERIZATION WITH CORONARY ANGIOGRAM N/A 06/02/2014   Procedure: LEFT HEART CATHETERIZATION WITH CORONARY ANGIOGRAM;  Surgeon: Sinclair Grooms, MD;  Location: [facility] CATH LAB;  Service: Cardiovascular;  Laterality: N/A;  . TEE WITHOUT CARDIOVERSION N/A 06/14/2014   Procedure: TRANSESOPHAGEAL ECHOCARDIOGRAM (TEE);  Surgeon: Gaye Pollack, MD;  Location: [place];  Service: Open Heart Surgery;  Laterality: N/A;     Medications: Current Meds  Medication Sig  . amLODipine (NORVASC) 5 MG tablet Take 5 mg by mouth daily.   Marland Kitchen anastrozole (ARIMIDEX) 1 MG tablet Take 1 tablet (1 mg total) by mouth daily.  Marland Kitchen atorvastatin (LIPITOR) 10 MG tablet Take 10 mg by mouth every evening.   . cetirizine (ZYRTEC) 10 MG tablet Take 10 mg by mouth daily.  . cloNIDine (CATAPRES) 0.1 MG tablet Take 0.1 mg by mouth every evening.   . clopidogrel (PLAVIX) 75 MG tablet Take 75 mg by mouth daily.  . Coenzyme Q10 300 MG CAPS Take 300 mg by mouth 2 (two) times daily.  Marland Kitchen docusate sodium (COLACE) 100 MG capsule Take 200 mg by mouth 2 (two) times daily.  . iron polysaccharides (NIFEREX) 150 MG capsule Take 150 mg by mouth 2 (two) times daily.  Marland Kitchen LANTUS SOLOSTAR 100 UNIT/ML Solostar Pen Inject 28 Units into the skin at bedtime.   Marland Kitchen levothyroxine (SYNTHROID, LEVOTHROID) 75 MCG tablet Take 75 mcg by mouth daily before breakfast.   . losartan-hydrochlorothiazide (HYZAAR) 100-25 MG tablet Take 1 tablet by  mouth daily.  . metoprolol (LOPRESSOR) 50 MG tablet Take 1 tablet (50 mg total) by mouth 2 (two) times daily.  . Multiple Vitamin (MULTIVITAMIN WITH MINERALS) TABS tablet Take 1 tablet by mouth at bedtime.  Marland Kitchen NITROSTAT 0.4 MG SL tablet Place 1 tablet (0.4 mg total) under the tongue every 5 (five) minutes as needed for chest pain.  . pantoprazole (PROTONIX) 40 MG tablet Take 1 tablet (40 mg total) by mouth daily before breakfast.  . potassium chloride SA (K-DUR,KLOR-CON) 20 MEQ tablet Take 20 mEq by mouth daily.  . sitaGLIPtin-metformin (JANUMET) 50-1000 MG tablet  Take 1 tablet by mouth 2 (two) times daily with a meal.  . [DISCONTINUED] iron polysaccharides (NIFEREX) 150 MG capsule Take 1 capsule (150 mg total) by mouth 3 (three) times daily with meals.  . [DISCONTINUED] NITROSTAT 0.4 MG SL tablet Take 0.4 mg by mouth every 5 (five) minutes as needed for chest pain.   . [DISCONTINUED] polyethylene glycol (MIRALAX / GLYCOLAX) packet Take 17 g by mouth daily.  . [DISCONTINUED] sitaGLIPtin-metformin (JANUMET) 50-1000 MG per tablet Take 1 tablet by mouth 2 (two) times daily. Hold for 48 hours, restart on 06/05/2014 (Patient taking differently: Take 1 tablet by mouth 2 (two) times daily. )   Current Facility-Administered Medications for the 06/11/17 encounter (Office Visit) with Burtis Junes, NP  Medication  . 0.9 %  sodium chloride infusion     Allergies: Allergies  Allergen Reactions  . Asa [Aspirin] Anaphylaxis  . Compazine [Prochlorperazine Edisylate] Swelling and Other (See Comments)    Tongue swelling   . Nsaids Hives and Swelling  . Shellfish Allergy Hives and Swelling  . Sulfa Antibiotics Hives and Swelling  . Zantac [Ranitidine Hcl] Hives and Swelling  . Lisinopril Cough    Social History: The patient  reports that she has never smoked. She has never used smokeless tobacco. She reports that she does not drink alcohol or use drugs.   Family History: The patient's family  history includes Breast cancer (age of onset: 36) in her cousin; Diabetes in her mother; Healthy in her brother; Heart attack in her mother; Hodgkin's lymphoma in her maternal uncle; Hypertension in her mother; Lung cancer in her brother; Ulcerative colitis in her daughter.   Review of Systems: Please see the history of present illness.   Otherwise, the review of systems is positive for none.   All other systems are reviewed and negative.   Physical Exam: VS:  BP 128/70   Pulse 67   Ht _0  (1.575 m)   Wt 156 lb 12.8 oz (71.1 kg)   SpO2 99%   BMI 28.68 kg/m  .  BMI Body mass index is 28.68 kg/m.  Wt Readings from Last 3 Encounters:  06/11/17 156 lb 12.8 oz (71.1 kg)  05/27/17 156 lb (70.8 kg)  05/24/17 155 lb 12.8 oz (70.7 kg)    General: Pleasant. Well developed, well nourished and in no acute distress.   HEENT: Normal.  Neck: Supple, no JVD, carotid bruits, or masses noted.  Cardiac: Regular rate and rhythm. No murmurs, rubs, or gallops. No edema.  Respiratory:  Lungs are clear to auscultation bilaterally with normal work of breathing.  GI: Soft and nontender.  MS: No deformity or atrophy. Gait and ROM intact.  Skin: Warm and dry. Color is normal. Color always pale.  Neuro:  Strength and sensation are intact and no gross focal deficits noted.  Psych: Alert, appropriate and with normal affect.   LABORATORY DATA:  EKG:  EKG is not ordered today.  Lab Results  Component Value Date   WBC 10.1 05/27/2017   HGB 11.1 (L) 05/27/2017   HCT 35.3 (L) 05/27/2017   PLT 377.0 05/27/2017   GLUCOSE 164 (H) 05/16/2017   CHOL  08/13/2007    116        ATP III CLASSIFICATION:  <200     mg/dL   Desirable  200-239  mg/dL   Borderline High  >=240    mg/dL   High   TRIG 69 08/13/2007   HDL 41 08/13/2007   LDLCALC  08/13/2007    61        Total Cholesterol/HDL:CHD Risk Coronary Heart Disease Risk Table                     Men   Women  1/2 Average Risk   3.4   3.3   ALT 14  02/19/2017   AST 15 02/19/2017   NA 133 (L) 05/16/2017   K 4.1 05/16/2017   CL 94 (L) 05/16/2017   CREATININE 0.72 05/16/2017   BUN 15 05/16/2017   CO2 21 05/16/2017   INR 1.59 (H) 06/14/2014   HGBA1C 14.0 (H) 11/13/2016     BNP (last 3 results) No results for input(s): BNP in the last 8760 hours.  ProBNP (last 3 results) No results for input(s): PROBNP in the last 8760 hours.     Other Studies Reviewed Today:   Assessment/Plan:  1. Coronary artery disease -- no active symptoms reported. She is post CABG from 06/2014. Continue with CV risk factor modification - NTG refilled today.   2. Essential hypertension- BP has improved with recent medicine changes. Recheck BMET today. She will continue to monitor. No further changes made today.   3. Type 2 diabetes mellitus with complication, without long-term current use of insulin ([place]) -not discussed today.   4. Hyperlipidemia - on statin therapy - labs by PCP noted. I gave her a drug holiday due to aching - may have improved but she has restarted her current statin and does not wish to change at this time.   5. Aching - seems ok at this time.    6. Vivid dreams/hallucinating - not discussed today.  7. OSA - back on her CPAP  Current medicines are reviewed with the patient today.  The patient does not have concerns regarding medicines other than what has been noted above.  The following changes have been made:  See above.  Labs/ tests ordered today include:    Orders Placed This Encounter  Procedures  . Basic metabolic panel     Disposition:   FU with Dr. Tamala Julian in March. I will be happy to see back as well.    Patient is agreeable to this plan and will call if any problems develop in the interim.   SignedTruitt Merle, NP  06/11/2017 10:02 AM  [facility] [address] [place], [place]  [address] Phone: [phone] Fax: [phone]

## 2017-06-17 ENCOUNTER — Telehealth: Payer: Self-pay | Admitting: Interventional Cardiology

## 2017-06-17 NOTE — Telephone Encounter (Signed)
Spoke with pt and reviewed lab results. Pt verbalized understanding.

## 2017-06-17 NOTE — Telephone Encounter (Signed)
Susan Barnett is returning a call she thinks about her lab results . Please call

## 2017-06-18 DIAGNOSIS — E871 Hypo-osmolality and hyponatremia: Secondary | ICD-10-CM | POA: Diagnosis not present

## 2017-06-18 DIAGNOSIS — N08 Glomerular disorders in diseases classified elsewhere: Secondary | ICD-10-CM | POA: Diagnosis not present

## 2017-06-18 DIAGNOSIS — I10 Essential (primary) hypertension: Secondary | ICD-10-CM | POA: Diagnosis not present

## 2017-06-18 DIAGNOSIS — Z794 Long term (current) use of insulin: Secondary | ICD-10-CM | POA: Diagnosis not present

## 2017-06-22 DIAGNOSIS — E119 Type 2 diabetes mellitus without complications: Secondary | ICD-10-CM | POA: Diagnosis not present

## 2017-07-02 DIAGNOSIS — H2513 Age-related nuclear cataract, bilateral: Secondary | ICD-10-CM | POA: Diagnosis not present

## 2017-07-02 DIAGNOSIS — H25013 Cortical age-related cataract, bilateral: Secondary | ICD-10-CM | POA: Diagnosis not present

## 2017-07-02 DIAGNOSIS — H02831 Dermatochalasis of right upper eyelid: Secondary | ICD-10-CM | POA: Diagnosis not present

## 2017-07-02 DIAGNOSIS — E119 Type 2 diabetes mellitus without complications: Secondary | ICD-10-CM | POA: Diagnosis not present

## 2017-07-08 DIAGNOSIS — G4733 Obstructive sleep apnea (adult) (pediatric): Secondary | ICD-10-CM | POA: Diagnosis not present

## 2017-07-15 ENCOUNTER — Other Ambulatory Visit (INDEPENDENT_AMBULATORY_CARE_PROVIDER_SITE_OTHER): Payer: Medicare Other

## 2017-07-15 DIAGNOSIS — D509 Iron deficiency anemia, unspecified: Secondary | ICD-10-CM

## 2017-07-15 LAB — CBC WITH DIFFERENTIAL/PLATELET
Basophils Absolute: 0 10*3/uL (ref 0.0–0.1)
Basophils Relative: 0.4 % (ref 0.0–3.0)
Eosinophils Absolute: 0.7 10*3/uL (ref 0.0–0.7)
Eosinophils Relative: 6.8 % — ABNORMAL HIGH (ref 0.0–5.0)
HCT: 36.4 % (ref 36.0–46.0)
Hemoglobin: 11.5 g/dL — ABNORMAL LOW (ref 12.0–15.0)
Lymphocytes Relative: 15.5 % (ref 12.0–46.0)
Lymphs Abs: 1.5 10*3/uL (ref 0.7–4.0)
MCHC: 31.7 g/dL (ref 30.0–36.0)
MCV: 77.1 fl — ABNORMAL LOW (ref 78.0–100.0)
Monocytes Absolute: 0.7 10*3/uL (ref 0.1–1.0)
Monocytes Relative: 6.9 % (ref 3.0–12.0)
Neutro Abs: 6.9 10*3/uL (ref 1.4–7.7)
Neutrophils Relative %: 70.4 % (ref 43.0–77.0)
Platelets: 381 10*3/uL (ref 150.0–400.0)
RBC: 4.72 Mil/uL (ref 3.87–5.11)
RDW: 18.5 % — ABNORMAL HIGH (ref 11.5–15.5)
WBC: 9.7 10*3/uL (ref 4.0–10.5)

## 2017-07-15 LAB — FERRITIN: Ferritin: 9 ng/mL — ABNORMAL LOW (ref 10.0–291.0)

## 2017-07-16 ENCOUNTER — Other Ambulatory Visit: Payer: Self-pay

## 2017-07-16 DIAGNOSIS — D509 Iron deficiency anemia, unspecified: Secondary | ICD-10-CM

## 2017-07-17 ENCOUNTER — Ambulatory Visit: Payer: Medicare Other | Admitting: Internal Medicine

## 2017-07-17 ENCOUNTER — Encounter: Payer: Self-pay | Admitting: Internal Medicine

## 2017-07-17 VITALS — BP 136/60 | HR 76 | Ht 62.0 in | Wt 159.5 lb

## 2017-07-17 DIAGNOSIS — K222 Esophageal obstruction: Secondary | ICD-10-CM | POA: Diagnosis not present

## 2017-07-17 DIAGNOSIS — D120 Benign neoplasm of cecum: Secondary | ICD-10-CM

## 2017-07-17 DIAGNOSIS — K2100 Gastro-esophageal reflux disease with esophagitis, without bleeding: Secondary | ICD-10-CM

## 2017-07-17 DIAGNOSIS — D509 Iron deficiency anemia, unspecified: Secondary | ICD-10-CM | POA: Diagnosis not present

## 2017-07-17 DIAGNOSIS — R1310 Dysphagia, unspecified: Secondary | ICD-10-CM | POA: Diagnosis not present

## 2017-07-17 NOTE — Progress Notes (Signed)
HISTORY OF PRESENT ILLNESS:  Susan Barnett is a 71 y.o. female with multiple significant medical problems including long-standing diabetes mellitus requiring insulin, coronary artery disease with prior intervention and subsequent CABG, breast cancer, osteoarthritis, and chronic Plavix therapy. She presents today for follow-up regarding assessment and treatment of iron deficiency anemia. She is accompanied by her husband I saw the patient in this office 03/14/2017 regarding new diagnosis of iron deficiency anemia, chronic GERD with new onset intermittent solid food dysphagia, and chronic stable constipation. See that dictation for details. Her baseline hemoglobin was approximately 12.7. Her nadir hemoglobin was 9.3 with iron saturation 3%. She was placed on iron. Colonoscopy and upper endoscopy were performed 05/27/2017. Colonoscopy revealed 2 diminutive polyps which were removed and found to be tubular adenomas. The remaining portion of the colon as well as the terminal ileum appeared normal. Follow-up in 5 years recommended. Upper endoscopy revealed distal esophageal stricture which was dilated with 52 French Maloney dilator. Exam was otherwise unremarkable for significant abnormalities. Duodenal biopsies were taken and returned normal. Repeat blood work that day revealed improved but persistent anemia with hemoglobin 11.1. Ferritin level was low at 9.2. She has been on iron twice daily. Repeat laboratories 2 days ago revealed hemoglobin 11.5, MCV 77, ferritin level 9 She presents today for follow-up with her husband. Patient has no particular complaints. She finished up radiation therapy for breast cancer this summer. She does report improvement in her swallowing. Iron has worsened her constipation necessitating the use of Colace which helps. They have multiple questions  REVIEW OF SYSTEMS:  All non-GI ROS negative except for back pain, arthritis, sinus and allergy, muscle cramps, fatigue  Past Medical  History:  Diagnosis Date  . Anemia    mild  . Anginal pain ([place])    with exertion, last time prior to cardiac caht 06/02/14  . Anxiety    situational  . Arthritis   . CAD (coronary artery disease)    Dr. Linard Millers follows, no problems now  . Cancer [facility])    Breast cancer  . Complication of anesthesia   . Diabetes mellitus, type 2 ([place])    TYPE 2  . Dysrhythmia    occ skips a beat, rate was fast before beta blocker.  . Gastric polyp   . Genetic testing 04/12/2017   Ms. Attridge underwent genetic counseling and testing for hereditary cancer syndromes on 02/19/2017. Her results were negative for mutations in all 46 genes analyzed by [facility]'s 46-gene Common Hereditary Cancers Panel. Genes analyzed include: APC, ATM, AXIN2, BARD1, BMPR1A, BRCA1, BRCA2, BRIP1, CDH1, CDKN2A, CHEK2, CTNNA1, DICER1, EPCAM, GREM1, HOXB13, KIT, MEN1, MLH1, MSH2, MSH3, MSH6, MUTYH, NBN  . GERD (gastroesophageal reflux disease)   . Headache    hx of  . Heartburn   . Hiatal hernia    mild head tremors  . History of radiation therapy 01/30/17- 02/21/17   Left Breast 42.56 Gy in 16 fractions  . Hypertension   . Hypothyroidism   . Occasional tremors    "of head", slight hands  . Osteopenia   . Pneumonia   . PONV (postoperative nausea and vomiting) 02/24/14  . Shortness of breath    with exertion  . Sleep apnea    cpap used nightly x 2 yrs now.    Past Surgical History:  Procedure Laterality Date  . BREAST BIOPSY Left 1987  . BREAST LUMPECTOMY WITH RADIOACTIVE SEED AND SENTINEL LYMPH NODE BIOPSY Left 12/25/2016   Procedure: BREAST LUMPECTOMY WITH RADIOACTIVE SEED  AND SENTINEL LYMPH NODE BIOPSY;  Surgeon: [name], MD;  Location: [place];  Service: General;  Laterality: Left;  . CARDIAC CATHETERIZATION  06/02/2014   LAD 30%, D1 80%, CFX 755, RCA > 95% ISR, rx w/ PTCA, heavy calcification, EF 65%  . CORONARY ARTERY BYPASS GRAFT N/A 06/14/2014   Procedure: CORONARY ARTERY BYPASS GRAFTING (CABG);   Surgeon: Gaye Pollack, MD;  Location: [place];  Service: Open Heart Surgery;  Laterality: N/A;  Times 2 using endoscopically harvested right saphenous vein.  . coronary stents     x3 stents placed-prior ablation  . DILATION AND CURETTAGE OF UTERUS    . ELBOW FRACTURE SURGERY Left   . INCONTINENCE SURGERY     sling  . KNEE ARTHROSCOPY Right 02/24/2014   Procedure: RIGHT KNEE ARTHROSCOPY WITH DEBRIDEMENT ;  Surgeon: Gearlean Alf, MD;  Location: [facility];  Service: Orthopedics;  Laterality: Right;  . LEFT HEART CATHETERIZATION WITH CORONARY ANGIOGRAM N/A 06/02/2014   Procedure: LEFT HEART CATHETERIZATION WITH CORONARY ANGIOGRAM;  Surgeon: Sinclair Grooms, MD;  Location[facility] CATH LAB;  Service: Cardiovascular;  Laterality: N/A;  . TEE WITHOUT CARDIOVERSION N/A 06/14/2014   Procedure: TRANSESOPHAGEAL ECHOCARDIOGRAM (TEE);  Surgeon: Gaye Pollack, MD;  Location: [place];  Service: Open Heart Surgery;  Laterality: N/A;    Social History DIANELY KREHBIEL  reports that  has never smoked. she has never used smokeless tobacco. She reports that she does not drink alcohol or use drugs.  family history includes Breast cancer (age of onset: 6) in her cousin; Diabetes in her mother; Healthy in her brother; Heart attack in her mother; Hodgkin's lymphoma in her maternal uncle; Hypertension in her mother; Lung cancer in her brother; Ulcerative colitis in her daughter.  Allergies  Allergen Reactions  . Asa [Aspirin] Anaphylaxis  . Compazine [Prochlorperazine Edisylate] Swelling and Other (See Comments)    Tongue swelling   . Nsaids Hives and Swelling  . Shellfish Allergy Hives and Swelling  . Sulfa Antibiotics Hives and Swelling  . Zantac [Ranitidine Hcl] Hives and Swelling  . Lisinopril Cough       PHYSICAL EXAMINATION: Vital signs: BP 136/60 (BP Location: Right Arm, Patient Position: Sitting, Cuff Size: Normal)   Pulse 76   Ht _0  (1.575 m) Comment: height measured without shoes  Wt 159 lb 8 oz  (72.3 kg)   BMI 29.17 kg/m   Constitutional: generally well-appearing, no acute distress Psychiatric: alert and oriented x3, cooperative Eyes: extraocular movements intact, anicteric, conjunctiva pink Mouth: oral pharynx moist, no lesions Neck: supple no lymphadenopathy Cardiovascular: heart regular rate and rhythm, no murmur Lungs: clear to auscultation bilaterally Abdomen: soft, nontender, nondistended, no obvious ascites, no peritoneal signs, normal bowel sounds, no organomegaly Rectal: Omitted Extremities: no clubbing, cyanosis, or lower extremity edema bilaterally Skin: no lesions on visible extremities Neuro: No focal deficits. Cranial nerves intact  ASSESSMENT:  #1. Iron deficiency anemia. Etiology not ascertained. Rule out small bowel lesion. On chronic Plavix #2. GERD complicated by symptomatic peptic stricture. Status post dilation. Currently asymptomatic post dilation on PPI #3. Adenomatous colon polyps #4. Multiple significant medical problems   PLAN:  #1. Continue oral iron twice daily #2. Schedule capsule endoscopy to rule out small bowel lesion. #3. Follow-up blood work to be determined. If patient continues to have anemia with low ferritin despite oral iron, then refer to hematology for evaluation and consideration of iron infusion #4. GI follow-up in this office thereafter to be determined #5.  Surveillance colonoscopy 5 years #6. Reflux precautions #7. Continue PPI #8. Ongoing general medical care with Dr. Kenton Kingfisher  25 minutes spent face-to-face with the patient. Greater than 50% a time use for counseling and coordination of care regarding her iron deficiency anemia and GERD, complicated by symptomatic peptic stricture

## 2017-07-17 NOTE — Patient Instructions (Signed)
   CAPSULE ENDOSCOPY PATIENT INSTRUCTION SHEET  Susan Barnett March 15, 1946 [id number]   1. 07/19/2017 Seven (7) days prior to capsule endoscopy stop taking iron supplements and carafate.  2. 07/24/2017 Two (2) days prior to capsule endoscopy stop taking aspirin or any arthritis drugs.  3. 07/25/2017 Day before capsule endoscopy purchase a 238 gram bottle of Miralax from the laxative section of your drug store, and a 32 oz. bottle of Gatorade (no red).    4. 07/25/2017 One (1) day prior to capsule endoscopy: a) Stop smoking. b) Eat a regular diet until 12:00 Noon. c) After 12:00 Noon take only the following: Black coffee  Jell-O (no fruit or red Jell-o) Water   Bouillon (chicken or beef) 7-Up   Cranberry Juice Tea   Kool-Aid Popsicle (not red) Sprite   Coke Ginger Ale  Pepsi Mountain Dew Gatorade d) At 6:00 pm the evening before your appointment, drink 7 capfuls (105 grams) of Miralax with 32 oz. Gatorade. Drink 8 oz every 15 minutes until gone. e) Nothing to eat or drink after midnight except medications with a sip of water.  5. 07/26/2017 Day of capsule endoscopy: Wear loose two-piece clothing to your exam. No medications for 2 hours prior to your test.  6. Please arrive at [facility]  3rd floor patient registration area by 8:00am on: 07/26/2017.   For any questions: Call [place] at [phone] and ask to speak with one of the capsule endoscopy nurses.  You will need to return to the clinic at 4:00 on the day of the capsule endoscopy.    The above instructions have been reviewed and explained to me by________________   Patient signature:_________________________________________     Date:________________

## 2017-07-26 ENCOUNTER — Ambulatory Visit (INDEPENDENT_AMBULATORY_CARE_PROVIDER_SITE_OTHER): Payer: Self-pay | Admitting: Internal Medicine

## 2017-07-26 ENCOUNTER — Encounter: Payer: Self-pay | Admitting: Internal Medicine

## 2017-07-26 DIAGNOSIS — D509 Iron deficiency anemia, unspecified: Secondary | ICD-10-CM

## 2017-07-26 NOTE — Progress Notes (Signed)
Patient here for capsule endoscopy. Tolerated procedure. Verbalizes understanding of written and verbal instructions. 

## 2017-08-05 ENCOUNTER — Telehealth: Payer: Self-pay

## 2017-08-05 ENCOUNTER — Other Ambulatory Visit: Payer: Self-pay

## 2017-08-05 DIAGNOSIS — D509 Iron deficiency anemia, unspecified: Secondary | ICD-10-CM

## 2017-08-05 DIAGNOSIS — T184XXA Foreign body in colon, initial encounter: Secondary | ICD-10-CM

## 2017-08-05 NOTE — Telephone Encounter (Signed)
-----   Message from Susan Ferguson, PA-C sent at 08/02/2017  5:30 PM EST ----- Regarding: FW: capsule I called pt and spoke to her about capsule results . She has not seen the capsule but the study was complete  So she has likely passed it . Please call her Monday  And get her scheduled to have CBC, and Iron studies first week of January . If she still hasn't seen capsule  , have her come in for KUB next week , Thank you ----- Message ----- From: Irene Shipper, MD Sent: 08/01/2017  12:49 PM To: Amy Genia Harold, PA-C, Algernon Huxley, RN Subject: RE: capsule                                    Amy, Please contact the patient regarding her capsule endoscopy findings. Also, reiterate the recommendations as outlined below. Thank you  Impression 1. Small bowel erosions 2. Iron deficiency anemia with minimal findings on endoscopic workup Recommendations 1. Continue iron 3 times daily 2. The blood work has are been scheduled for next month. She should follow-through 3. If iron deficiency anemia refractory to oral Iron therapy, refer to hematology for evaluation and possible iron infusion  Thank you, JP ----- Message ----- From: Susan Ferguson, PA-C Sent: 07/31/2017   1:25 PM To: Irene Shipper, MD Subject: capsule                                        Capsule ready for review - she has several erosions in proximal small bowel - otherwise negative

## 2017-08-05 NOTE — Telephone Encounter (Signed)
Pt aware, orders in epic for labs and KUB, she has not seen the capsule.

## 2017-08-08 ENCOUNTER — Ambulatory Visit (INDEPENDENT_AMBULATORY_CARE_PROVIDER_SITE_OTHER)
Admission: RE | Admit: 2017-08-08 | Discharge: 2017-08-08 | Disposition: A | Discharge status: Home / Self Care | Payer: Medicare Other | Source: Ambulatory Visit | Attending: Physician Assistant | Admitting: Physician Assistant

## 2017-08-08 DIAGNOSIS — T184XXA Foreign body in colon, initial encounter: Secondary | ICD-10-CM | POA: Diagnosis not present

## 2017-08-08 DIAGNOSIS — R109 Unspecified abdominal pain: Secondary | ICD-10-CM | POA: Diagnosis not present

## 2017-08-08 DIAGNOSIS — C50912 Malignant neoplasm of unspecified site of left female breast: Secondary | ICD-10-CM | POA: Diagnosis not present

## 2017-08-13 HISTORY — PX: BREAST BIOPSY: SHX20

## 2017-08-15 ENCOUNTER — Other Ambulatory Visit: Payer: Self-pay

## 2017-08-15 ENCOUNTER — Other Ambulatory Visit (INDEPENDENT_AMBULATORY_CARE_PROVIDER_SITE_OTHER): Payer: Medicare Other

## 2017-08-15 DIAGNOSIS — D509 Iron deficiency anemia, unspecified: Secondary | ICD-10-CM

## 2017-08-15 LAB — CBC WITH DIFFERENTIAL/PLATELET
Basophils Absolute: 0.1 10*3/uL (ref 0.0–0.1)
Basophils Relative: 0.6 % (ref 0.0–3.0)
Eosinophils Absolute: 0.7 10*3/uL (ref 0.0–0.7)
Eosinophils Relative: 7 % — ABNORMAL HIGH (ref 0.0–5.0)
HCT: 35.9 % — ABNORMAL LOW (ref 36.0–46.0)
Hemoglobin: 11.4 g/dL — ABNORMAL LOW (ref 12.0–15.0)
Lymphocytes Relative: 13.8 % (ref 12.0–46.0)
Lymphs Abs: 1.4 10*3/uL (ref 0.7–4.0)
MCHC: 31.8 g/dL (ref 30.0–36.0)
MCV: 78.1 fl (ref 78.0–100.0)
Monocytes Absolute: 0.7 10*3/uL (ref 0.1–1.0)
Monocytes Relative: 6.3 % (ref 3.0–12.0)
Neutro Abs: 7.6 10*3/uL (ref 1.4–7.7)
Neutrophils Relative %: 72.3 % (ref 43.0–77.0)
Platelets: 354 10*3/uL (ref 150.0–400.0)
RBC: 4.6 Mil/uL (ref 3.87–5.11)
RDW: 17.9 % — ABNORMAL HIGH (ref 11.5–15.5)
WBC: 10.5 10*3/uL (ref 4.0–10.5)

## 2017-08-15 LAB — IBC PANEL
Iron: 41 ug/dL — ABNORMAL LOW (ref 42–145)
Saturation Ratios: 8.4 % — ABNORMAL LOW (ref 20.0–50.0)
Transferrin: 350 mg/dL (ref 212.0–360.0)

## 2017-08-15 LAB — FERRITIN: Ferritin: 7.8 ng/mL — ABNORMAL LOW (ref 10.0–291.0)

## 2017-08-16 ENCOUNTER — Telehealth: Payer: Self-pay | Admitting: Hematology and Oncology

## 2017-08-16 NOTE — Telephone Encounter (Signed)
Spoke with patient regarding appointment D/T Estab. PT per VG

## 2017-08-19 NOTE — Assessment & Plan Note (Signed)
Left breast biopsy02/19/2018: 12:30: IDC with DCIS involving underlying papillary lesion, grade 1, ER 100%, PR 90%, Ki-67 10%, HER-2 negative ratio 1.29; irregular 1.9 cm mass axilla negative, T1 CN 0 stage IA clinical stage Left lumpectomy 12/25/2016: IDC grade 1, 1.5 cm, low-grade DCIS, resection margins negative, 0/2 lymph nodes negative, ER 100%, PR 100%, HER-2 negative ratio 1.29, Ki-67 10% T1 cN0 stage IA Oncotype DX score 2: 4% risk of recurrence  Adjuvant radiation therapy 01/30/2017- 02/21/2017  Treatment plan: Anastrozole 1 mg by mouth daily 5 years started 02/21/17  Anastrozole Toxicities:  RTC in 1 year for follow up

## 2017-08-20 ENCOUNTER — Telehealth: Payer: Self-pay | Admitting: Hematology and Oncology

## 2017-08-20 ENCOUNTER — Inpatient Hospital Stay: Payer: Medicare Other | Attending: Hematology and Oncology | Admitting: Hematology and Oncology

## 2017-08-20 DIAGNOSIS — Z794 Long term (current) use of insulin: Secondary | ICD-10-CM | POA: Insufficient documentation

## 2017-08-20 DIAGNOSIS — M109 Gout, unspecified: Secondary | ICD-10-CM

## 2017-08-20 DIAGNOSIS — Z79899 Other long term (current) drug therapy: Secondary | ICD-10-CM | POA: Diagnosis not present

## 2017-08-20 DIAGNOSIS — M129 Arthropathy, unspecified: Secondary | ICD-10-CM | POA: Diagnosis not present

## 2017-08-20 DIAGNOSIS — Z79811 Long term (current) use of aromatase inhibitors: Secondary | ICD-10-CM | POA: Diagnosis not present

## 2017-08-20 DIAGNOSIS — D509 Iron deficiency anemia, unspecified: Secondary | ICD-10-CM | POA: Insufficient documentation

## 2017-08-20 DIAGNOSIS — Z923 Personal history of irradiation: Secondary | ICD-10-CM | POA: Insufficient documentation

## 2017-08-20 DIAGNOSIS — K5900 Constipation, unspecified: Secondary | ICD-10-CM | POA: Diagnosis not present

## 2017-08-20 DIAGNOSIS — R109 Unspecified abdominal pain: Secondary | ICD-10-CM | POA: Insufficient documentation

## 2017-08-20 DIAGNOSIS — C50412 Malignant neoplasm of upper-outer quadrant of left female breast: Secondary | ICD-10-CM | POA: Diagnosis not present

## 2017-08-20 DIAGNOSIS — Z170 Estrogen receptor positive status [ER+]: Secondary | ICD-10-CM | POA: Insufficient documentation

## 2017-08-20 NOTE — Assessment & Plan Note (Signed)
In October 2018, upper endoscopy, colonoscopy, capsule endoscopy were all negative Patient had been on oral iron therapy for the past 3 months without any improvement in her iron saturations. Severe intolerance to oral iron therapy with constipation and abdominal pain. Treatment plan: I suspect that she has malabsorption of iron and hence I instructed her to stop taking oral iron. I recommended that she start IV iron treatment 2 doses 1 week apart starting next Friday.    I will see her in April and will recheck her CBC and iron studies.

## 2017-08-20 NOTE — Telephone Encounter (Signed)
Gave patient AVs and calendar of upcoming January and April appointments.

## 2017-08-20 NOTE — Progress Notes (Signed)
Follow-up of iron deficiency anemia  Patient Care Team: Shirline Frees, MD as PCP - General (Family Medicine) Delice Bison, Charlestine Massed, NP as Nurse Practitioner (Hematology and Oncology) [name], MD as Consulting Physician (Hematology and Oncology) Eppie Gibson, MD as Attending Physician (Radiation Oncology) [name], MD as Consulting Physician (General Surgery)  DIAGNOSIS:  Encounter Diagnoses  Name Primary?  . Malignant neoplasm of upper-outer quadrant of left breast in female, estrogen receptor positive ([place])   . Iron deficiency anemia, unspecified iron deficiency anemia type     SUMMARY OF ONCOLOGIC HISTORY:   Malignant neoplasm of upper-outer quadrant of left breast in female, estrogen receptor positive ([place])   10/01/2016 Initial Diagnosis    Left breast biopsy: 12:30: IDC with DCIS involving underlying papillary lesion, grade 1, ER 100%, PR 90%, Ki-67 10%, HER-2 negative ratio 1.29; irregular 1.9 cm mass axilla negative, T1 CN 0 stage IA clinical stage      12/25/2016 Surgery    Left lumpectomy: IDC grade 1, 1.5 cm, low-grade DCIS, resection margins negative, 0/2 lymph nodes negative, ER 100%, PR 100%, HER-2 negative ratio 1.29, Ki-67 10% T1 cN0 stage IA      12/25/2016 Oncotype testing    Oncotype DX score 2: Risk of recurrence 4%      01/30/2017 - 02/21/2017 Radiation Therapy    Adjuvant radiation therapy      02/2017 -  Anti-estrogen oral therapy    Anastrozole daily      03/25/2017 Genetic Testing    Genetic counseling and testing for hereditary cancer syndromes performed on 02/19/2017. Results are negative for pathogenic mutations in 46 genes analyzed by [facility]'s Common Hereditary Cancers Panel. Results are dated 03/25/2017. Genes tested: APC, ATM, AXIN2, BARD1, BMPR1A, BRCA1, BRCA2, BRIP1, CDH1, CDKN2A, CHEK2, CTNNA1, DICER1, EPCAM, GREM1, HOXB13, KIT, MEN1, MLH1, MSH2, MSH3, MSH6, MUTYH, NBN, NF1, NTHL1, PALB2, PDGFRA, PMS2, POLD1, POLE, PTEN, RAD50,  RAD51C, RAD51D, SDHA, SDHB, SDHC, SDHD, SMAD4, SMARCA4, STK11, TP53, TSC1, TSC2, and VHL.          CHIEF COMPLIANT: Iron deficiency anemia  INTERVAL HISTORY: Susan Barnett is a 72 year old with above-mentioned history of left breast cancer who was also recently diagnosed with iron deficiency anemia and she has been sent to [place] for discussion regarding intravenous iron therapy.  She was noted to be mild to moderate anemia and was placed on oral iron therapy initially once a day then subsequently increased to 3 times a day.  In spite of all of this she continued to have iron deficiency.  She had undergone upper endoscopy colonoscopy and capsule endoscopy all of which were negative for bleeding.  Because of this I suspect that she has malabsorption.  She eats a very healthy diet and includes both meats as well as green vegetables.  She is asked complaining of constipation and gastritis and abdominal pain as result of oral iron therapy.  REVIEW OF SYSTEMS:   Constitutional: Denies fevers, chills or abnormal weight loss Eyes: Denies blurriness of vision Ears, nose, mouth, throat, and face: Denies mucositis or sore throat Respiratory: Denies cough, dyspnea or wheezes Cardiovascular: Denies palpitation, chest discomfort Gastrointestinal:  Denies nausea, heartburn or change in bowel habits Skin: Denies abnormal skin rashes Lymphatics: Denies new lymphadenopathy or easy bruising Neurological:Denies numbness, tingling or new weaknesses Behavioral/Psych: Mood is stable, no new changes  Extremities: No lower extremity edema  All other systems were reviewed with the patient and are negative.  I have reviewed the past medical history, past surgical history, social  history and family history with the patient and they are unchanged from previous note.  ALLERGIES:  is allergic to asa [aspirin]; compazine [prochlorperazine edisylate]; nsaids; shellfish allergy; sulfa antibiotics; zantac [ranitidine hcl];  and lisinopril.  MEDICATIONS:  Current Outpatient Medications  Medication Sig Dispense Refill  . amLODipine (NORVASC) 5 MG tablet Take 5 mg by mouth daily.     Marland Kitchen anastrozole (ARIMIDEX) 1 MG tablet Take 1 tablet (1 mg total) by mouth daily. 90 tablet 3  . atorvastatin (LIPITOR) 10 MG tablet Take 10 mg by mouth every evening.     . cetirizine (ZYRTEC) 10 MG tablet Take 10 mg by mouth daily.    . cloNIDine (CATAPRES) 0.1 MG tablet Take 0.1 mg by mouth every evening.     . clopidogrel (PLAVIX) 75 MG tablet Take 75 mg by mouth daily.    . Coenzyme Q10 300 MG CAPS Take 300 mg by mouth 2 (two) times daily.    . iron polysaccharides (NIFEREX) 150 MG capsule Take 150 mg by mouth 2 (two) times daily.    Marland Kitchen LANTUS SOLOSTAR 100 UNIT/ML Solostar Pen Inject 32 Units into the skin at bedtime.   0  . levothyroxine (SYNTHROID, LEVOTHROID) 75 MCG tablet Take 75 mcg by mouth daily before breakfast.     . losartan-hydrochlorothiazide (HYZAAR) 100-25 MG tablet Take 1 tablet by mouth daily. 90 tablet 3  . metoprolol (LOPRESSOR) 50 MG tablet Take 1 tablet (50 mg total) by mouth 2 (two) times daily. 180 tablet 3  . Multiple Vitamin (MULTIVITAMIN WITH MINERALS) TABS tablet Take 1 tablet by mouth at bedtime.    Marland Kitchen NITROSTAT 0.4 MG SL tablet Place 1 tablet (0.4 mg total) under the tongue every 5 (five) minutes as needed for chest pain. (Patient not taking: Reported on 07/17/2017) 25 tablet 3  . pantoprazole (PROTONIX) 40 MG tablet Take 1 tablet (40 mg total) by mouth daily before breakfast. 90 tablet 3  . potassium chloride SA (K-DUR,KLOR-CON) 20 MEQ tablet Take 20 mEq by mouth daily.  2  . sitaGLIPtin-metformin (JANUMET) 50-1000 MG tablet Take 1 tablet by mouth 2 (two) times daily with a meal.     Current Facility-Administered Medications  Medication Dose Route Frequency Provider Last Rate Last Dose  . 0.9 %  sodium chloride infusion  500 mL Intravenous Continuous Irene Shipper, MD        PHYSICAL EXAMINATION: ECOG  PERFORMANCE STATUS: 1 - Symptomatic but completely ambulatory  Vitals:   08/20/17 1049  BP: (!) 152/52  Pulse: 72  Resp: 16  Temp: 98.1 F (36.7 C)  SpO2: 100%   Filed Weights   08/20/17 1049  Weight: 158 lb (71.7 kg)    GENERAL:alert, no distress and comfortable SKIN: skin color, texture, turgor are normal, no rashes or significant lesions EYES: normal, Conjunctiva are pink and non-injected, sclera clear OROPHARYNX:no exudate, no erythema and lips, buccal mucosa, and tongue normal  NECK: supple, thyroid normal size, non-tender, without nodularity LYMPH:  no palpable lymphadenopathy in the cervical, axillary or inguinal LUNGS: clear to auscultation and percussion with normal breathing effort HEART: regular rate & rhythm and no murmurs and no lower extremity edema ABDOMEN:abdomen soft, non-tender and normal bowel sounds MUSCULOSKELETAL:no cyanosis of digits and no clubbing  NEURO: alert & oriented x 3 with fluent speech, no focal motor/sensory deficits EXTREMITIES: No lower extremity edema  LABORATORY DATA:  I have reviewed the data as listed   Chemistry      Component Value Date/Time  NA 133 (L) 06/11/2017 1006   K 4.2 06/11/2017 1006   CL 92 (L) 06/11/2017 1006   CO2 27 06/11/2017 1006   BUN 11 06/11/2017 1006   CREATININE 0.67 06/11/2017 1006      Component Value Date/Time   CALCIUM 9.8 06/11/2017 1006   ALKPHOS 52 02/19/2017 1409   AST 15 02/19/2017 1409   ALT 14 02/19/2017 1409   BILITOT 0.4 02/19/2017 1409       Lab Results  Component Value Date   WBC 10.5 08/15/2017   HGB 11.4 (L) 08/15/2017   HCT 35.9 (L) 08/15/2017   MCV 78.1 08/15/2017   PLT 354.0 08/15/2017   NEUTROABS 7.6 08/15/2017    ASSESSMENT & PLAN:  Malignant neoplasm of upper-outer quadrant of left breast in female, estrogen receptor positive ([facility]) Left breast biopsy02/19/2018: 12:30: IDC with DCIS involving underlying papillary lesion, grade 1, ER 100%, PR 90%, Ki-67 10%, HER-2  negative ratio 1.29; irregular 1.9 cm mass axilla negative, T1 CN 0 stage IA clinical stage Left lumpectomy 12/25/2016: IDC grade 1, 1.5 cm, low-grade DCIS, resection margins negative, 0/2 lymph nodes negative, ER 100%, PR 100%, HER-2 negative ratio 1.29, Ki-67 10% T1 cN0 stage IA Oncotype DX score 2: 4% risk of recurrence  Adjuvant radiation therapy 01/30/2017- 02/21/2017  Treatment plan: Anastrozole 1 mg by mouth daily 5 years started 02/21/17  Anastrozole Toxicities: Complains of diffuse musculoskeletal aches and pains in the shoulders hips and knees.  She attributes this to arthritis.  RTC in 1 year for follow up   Iron deficiency anemia In October 2018, upper endoscopy, colonoscopy, capsule endoscopy were all negative Patient had been on oral iron therapy for the past 3 months without any improvement in her iron saturations. Severe intolerance to oral iron therapy with constipation and abdominal pain. Treatment plan: I suspect that she has malabsorption of iron and hence I instructed her to stop taking oral iron. I recommended that she start IV iron treatment 2 doses 1 week apart starting next Friday.    I will see her in April and will recheck her CBC and iron studies.   I spent 25 minutes talking to the patient of which more than half was spent in counseling and coordination of care.  Orders Placed This Encounter  Procedures  . Iron and TIBC    Standing Status:   Future    Standing Expiration Date:   08/20/2018  . Ferritin    Standing Status:   Future    Standing Expiration Date:   08/20/2018  . CBC with Differential (Cancer Center Only)    Standing Status:   Future    Standing Expiration Date:   08/20/2018   The patient has a good understanding of the overall plan. she agrees with it. she will call with any problems that may develop before the next visit here.   Harriette Ohara, MD 08/20/17

## 2017-08-22 ENCOUNTER — Encounter: Payer: Self-pay | Admitting: Cardiology

## 2017-08-23 ENCOUNTER — Inpatient Hospital Stay: Payer: Medicare Other

## 2017-08-23 VITALS — BP 128/61 | HR 68 | Temp 97.6°F | Resp 17

## 2017-08-23 DIAGNOSIS — C50412 Malignant neoplasm of upper-outer quadrant of left female breast: Secondary | ICD-10-CM | POA: Diagnosis not present

## 2017-08-23 DIAGNOSIS — D509 Iron deficiency anemia, unspecified: Secondary | ICD-10-CM

## 2017-08-23 MED ORDER — SODIUM CHLORIDE 0.9 % IV SOLN
Freq: Once | INTRAVENOUS | Status: AC
  Administered 2017-08-23: 10:00:00 via INTRAVENOUS

## 2017-08-23 MED ORDER — SODIUM CHLORIDE 0.9 % IV SOLN
510.0000 mg | Freq: Once | INTRAVENOUS | Status: AC
  Administered 2017-08-23: 510 mg via INTRAVENOUS
  Filled 2017-08-23: qty 17

## 2017-08-23 NOTE — Patient Instructions (Signed)

## 2017-08-26 NOTE — Progress Notes (Signed)
Cardiology Office Note:    Date:  08/27/2017   ID:  Susan Barnett, DOB 12-Dec-1945, MRN [id number]  PCP:  Shirline Frees, MD  Cardiologist:  No primary care provider on file.    Referring MD: Shirline Frees, MD   Chief Complaint  Patient presents with  . Sleep Apnea  . Hypertension    History of Present Illness:    Susan Barnett is a 72 y.o. female with a hx of OSA on CPAP and HTN.  She has been on CPAP for some time and is doing well . I saw her last in October and a new CPAP was ordered.  She is now here for 10 week followup per insurance requirements to document compliance.    She is doing well with her CPAP device .  She tolerates the nasal mask with chin strap and feels the pressure is adequate.  Since going on CPAP, her daytime sleepiness has improved.  She has some mild mouth dryness.  She does not think that he snores.     Past Medical History:  Diagnosis Date  . Anemia    mild  . Anginal pain ([place])    with exertion, last time prior to cardiac caht 06/02/14  . Anxiety    situational  . Arthritis   . CAD (coronary artery disease)    Dr. Linard Millers follows, no problems now  . Cancer [facility])    Breast cancer  . Complication of anesthesia   . Diabetes mellitus, type 2 ([place])    TYPE 2  . Dysrhythmia    occ skips a beat, rate was fast before beta blocker.  . Gastric polyp   . Genetic testing 04/12/2017   Ms. Reddick underwent genetic counseling and testing for hereditary cancer syndromes on 02/19/2017. Her results were negative for mutations in all 46 genes analyzed by [facility]'s 46-gene Common Hereditary Cancers Panel. Genes analyzed include: APC, ATM, AXIN2, BARD1, BMPR1A, BRCA1, BRCA2, BRIP1, CDH1, CDKN2A, CHEK2, CTNNA1, DICER1, EPCAM, GREM1, HOXB13, KIT, MEN1, MLH1, MSH2, MSH3, MSH6, MUTYH, NBN  . GERD (gastroesophageal reflux disease)   . Headache    hx of  . Heartburn   . Hiatal hernia    mild head tremors  . History of radiation therapy 01/30/17- 02/21/17    Left Breast 42.56 Gy in 16 fractions  . Hypertension   . Hypothyroidism   . Occasional tremors    "of head", slight hands  . Osteopenia   . Pneumonia   . PONV (postoperative nausea and vomiting) 02/24/14  . Shortness of breath    with exertion  . Sleep apnea    cpap used nightly x 2 yrs now.    Past Surgical History:  Procedure Laterality Date  . BREAST BIOPSY Left 1987  . BREAST LUMPECTOMY WITH RADIOACTIVE SEED AND SENTINEL LYMPH NODE BIOPSY Left 12/25/2016   Procedure: BREAST LUMPECTOMY WITH RADIOACTIVE SEED AND SENTINEL LYMPH NODE BIOPSY;  Surgeon: [name], MD;  Location: [place];  Service: General;  Laterality: Left;  . CARDIAC CATHETERIZATION  06/02/2014   LAD 30%, D1 80%, CFX 755, RCA > 95% ISR, rx w/ PTCA, heavy calcification, EF 65%  . CORONARY ARTERY BYPASS GRAFT N/A 06/14/2014   Procedure: CORONARY ARTERY BYPASS GRAFTING (CABG);  Surgeon: Gaye Pollack, MD;  Location: [place];  Service: Open Heart Surgery;  Laterality: N/A;  Times 2 using endoscopically harvested right saphenous vein.  . coronary stents     x3 stents placed-prior ablation  . DILATION AND  CURETTAGE OF UTERUS    . ELBOW FRACTURE SURGERY Left   . INCONTINENCE SURGERY     sling  . KNEE ARTHROSCOPY Right 02/24/2014   Procedure: RIGHT KNEE ARTHROSCOPY WITH DEBRIDEMENT ;  Surgeon: Gearlean Alf, MD;  Location: [facility];  Service: Orthopedics;  Laterality: Right;  . LEFT HEART CATHETERIZATION WITH CORONARY ANGIOGRAM N/A 06/02/2014   Procedure: LEFT HEART CATHETERIZATION WITH CORONARY ANGIOGRAM;  Surgeon: Sinclair Grooms, MD;  Location: [facility] CATH LAB;  Service: Cardiovascular;  Laterality: N/A;  . TEE WITHOUT CARDIOVERSION N/A 06/14/2014   Procedure: TRANSESOPHAGEAL ECHOCARDIOGRAM (TEE);  Surgeon: Gaye Pollack, MD;  Location: [place];  Service: Open Heart Surgery;  Laterality: N/A;    Current Medications: Current Meds  Medication Sig  . amLODipine (NORVASC) 5 MG tablet Take 5 mg by mouth daily.   Marland Kitchen  anastrozole (ARIMIDEX) 1 MG tablet Take 1 tablet (1 mg total) by mouth daily.  Marland Kitchen atorvastatin (LIPITOR) 10 MG tablet Take 10 mg by mouth every evening.   . cetirizine (ZYRTEC) 10 MG tablet Take 10 mg by mouth daily.  . cloNIDine (CATAPRES) 0.1 MG tablet Take 0.1 mg by mouth every evening.   . clopidogrel (PLAVIX) 75 MG tablet Take 75 mg by mouth daily.  . Coenzyme Q10 300 MG CAPS Take 300 mg by mouth 2 (two) times daily.  Marland Kitchen LANTUS SOLOSTAR 100 UNIT/ML Solostar Pen Inject 32 Units into the skin at bedtime.   Marland Kitchen levothyroxine (SYNTHROID, LEVOTHROID) 75 MCG tablet Take 75 mcg by mouth daily before breakfast.   . losartan-hydrochlorothiazide (HYZAAR) 100-25 MG tablet Take 1 tablet by mouth daily.  . metoprolol (LOPRESSOR) 50 MG tablet Take 1 tablet (50 mg total) by mouth 2 (two) times daily.  . Multiple Vitamin (MULTIVITAMIN WITH MINERALS) TABS tablet Take 1 tablet by mouth at bedtime.  Marland Kitchen NITROSTAT 0.4 MG SL tablet Place 1 tablet (0.4 mg total) under the tongue every 5 (five) minutes as needed for chest pain.  . pantoprazole (PROTONIX) 40 MG tablet Take 1 tablet (40 mg total) by mouth daily before breakfast.  . potassium chloride SA (K-DUR,KLOR-CON) 20 MEQ tablet Take 20 mEq by mouth daily.  . sitaGLIPtin-metformin (JANUMET) 50-1000 MG tablet Take 1 tablet by mouth 2 (two) times daily with a meal.   Current Facility-Administered Medications for the 08/27/17 encounter (Office Visit) with Sueanne Margarita, MD  Medication  . 0.9 %  sodium chloride infusion     Allergies:   Asa [aspirin]; Compazine [prochlorperazine edisylate]; Nsaids; Shellfish allergy; Sulfa antibiotics; Zantac [ranitidine hcl]; and Lisinopril   Social History   Socioeconomic History  . Marital status: Married    Spouse name: None  . Number of children: None  . Years of education: None  . Highest education level: None  Social Needs  . Financial resource strain: None  . Food insecurity - worry: None  . Food insecurity -  inability: None  . Transportation needs - medical: None  . Transportation needs - non-medical: None  Occupational History  . None  Tobacco Use  . Smoking status: Never Smoker  . Smokeless tobacco: Never Used  Substance and Sexual Activity  . Alcohol use: No  . Drug use: No  . Sexual activity: Not Currently  Other Topics Concern  . None  Social History Narrative  . None     Family History: The patient's family history includes Breast cancer (age of onset: 38) in her cousin; Diabetes in her mother; Healthy in her brother; Heart  attack in her mother; Hodgkin's lymphoma in her maternal uncle; Hypertension in her mother; Lung cancer in her brother; Ulcerative colitis in her daughter.  ROS:   Please see the history of present illness.    ROS  All other systems reviewed and negative.   EKGs/Labs/Other Studies Reviewed:    The following studies were reviewed today: CPAP download  EKG:  EKG is not ordered today.    Recent Labs: 02/19/2017: ALT 14 06/11/2017: BUN 11; Creatinine, Ser 0.67; Potassium 4.2; Sodium 133 08/15/2017: Hemoglobin 11.4; Platelets 354.0   Recent Lipid Panel    Component Value Date/Time   CHOL  08/13/2007 0615    116        ATP III CLASSIFICATION:  <200     mg/dL   Desirable  200-239  mg/dL   Borderline High  >=240    mg/dL   High   TRIG 69 08/13/2007 0615   HDL 41 08/13/2007 0615   CHOLHDL 2.8 08/13/2007 0615   VLDL 14 08/13/2007 0615   LDLCALC  08/13/2007 0615    61        Total Cholesterol/HDL:CHD Risk Coronary Heart Disease Risk Table                     Men   Women  1/2 Average Risk   3.4   3.3    Physical Exam:    VS:  BP (!) 148/70 Comment: Right arm 148/78  Pulse 74   Ht 5' 2"  (1.575 m)   Wt 161 lb 3.2 oz (73.1 kg)   BMI 29.48 kg/m     Wt Readings from Last 3 Encounters:  08/27/17 161 lb 3.2 oz (73.1 kg)  08/20/17 158 lb (71.7 kg)  07/17/17 159 lb 8 oz (72.3 kg)     GEN:  Well nourished, well developed in no acute  distress HEENT: Normal NECK: No JVD; No carotid bruits LYMPHATICS: No lymphadenopathy CARDIAC: RRR, no murmurs, rubs, gallops RESPIRATORY:  Clear to auscultation without rales, wheezing or rhonchi  ABDOMEN: Soft, non-tender, non-distended MUSCULOSKELETAL:  No edema; No deformity  SKIN: Warm and dry NEUROLOGIC:  Alert and oriented x 3 PSYCHIATRIC:  Normal affect   ASSESSMENT:    1. OSA (obstructive sleep apnea)   2. Essential hypertension    PLAN:    In order of problems listed above:  1.  OSA - the patient is tolerating PAP therapy well without any problems. The PAP download was reviewed today and showed an AHI of 0.9/hr on 14 cm H2O with 90% compliance in using more than 4 hours nightly.  The patient has been using and benefiting from PAP use and will continue to benefit from therapy.   2.  HTN - BP is borderline controlled on exam but she just took her meds.  She will continue on amlodipine 73m daily, CLonidine 0.176mdaily, Lopressor 5014mID and Losartan HCT 100-2m54mily   Medication Adjustments/Labs and Tests Ordered: Current medicines are reviewed at length with the patient today.  Concerns regarding medicines are outlined above.  No orders of the defined types were placed in this encounter.  No orders of the defined types were placed in this encounter.   Signed, TracFransico Him  08/27/2017 8:53 AM    [place]

## 2017-08-27 ENCOUNTER — Encounter: Payer: Self-pay | Admitting: Cardiology

## 2017-08-27 ENCOUNTER — Ambulatory Visit: Payer: Medicare Other | Admitting: Cardiology

## 2017-08-27 VITALS — BP 148/70 | HR 74 | Ht 62.0 in | Wt 161.2 lb

## 2017-08-27 DIAGNOSIS — G4733 Obstructive sleep apnea (adult) (pediatric): Secondary | ICD-10-CM | POA: Diagnosis not present

## 2017-08-27 DIAGNOSIS — I10 Essential (primary) hypertension: Secondary | ICD-10-CM | POA: Diagnosis not present

## 2017-08-27 NOTE — Patient Instructions (Signed)
Medication Instructions:  Your physician recommends that you continue on your current medications as directed. Please refer to the Current Medication list given to you today.  If you need a refill on your cardiac medications, please contact your pharmacy first.  Labwork: None ordered   Testing/Procedures: None ordered   Follow-Up: Your physician wants you to follow-up in: 1 year with Dr. Turner. You will receive a reminder letter in the mail two months in advance. If you don't receive a letter, please call our office to schedule the follow-up appointment.  Any Other Special Instructions Will Be Listed Below (If Applicable).     If you need a refill on your cardiac medications before your next appointment, please call your pharmacy.   

## 2017-08-30 ENCOUNTER — Inpatient Hospital Stay: Payer: Medicare Other

## 2017-08-30 VITALS — BP 127/56 | HR 67 | Temp 98.0°F | Resp 18

## 2017-08-30 DIAGNOSIS — C50412 Malignant neoplasm of upper-outer quadrant of left female breast: Secondary | ICD-10-CM | POA: Diagnosis not present

## 2017-08-30 DIAGNOSIS — D509 Iron deficiency anemia, unspecified: Secondary | ICD-10-CM

## 2017-08-30 MED ORDER — SODIUM CHLORIDE 0.9 % IV SOLN
510.0000 mg | Freq: Once | INTRAVENOUS | Status: AC
  Administered 2017-08-30: 510 mg via INTRAVENOUS
  Filled 2017-08-30: qty 17

## 2017-08-30 MED ORDER — SODIUM CHLORIDE 0.9 % IV SOLN
Freq: Once | INTRAVENOUS | Status: AC
  Administered 2017-08-30: 10:00:00 via INTRAVENOUS

## 2017-08-30 NOTE — Patient Instructions (Signed)

## 2017-10-01 DIAGNOSIS — D126 Benign neoplasm of colon, unspecified: Secondary | ICD-10-CM | POA: Diagnosis not present

## 2017-10-01 DIAGNOSIS — E871 Hypo-osmolality and hyponatremia: Secondary | ICD-10-CM | POA: Diagnosis not present

## 2017-10-01 DIAGNOSIS — E1129 Type 2 diabetes mellitus with other diabetic kidney complication: Secondary | ICD-10-CM | POA: Diagnosis not present

## 2017-10-01 DIAGNOSIS — N08 Glomerular disorders in diseases classified elsewhere: Secondary | ICD-10-CM | POA: Diagnosis not present

## 2017-10-21 NOTE — Progress Notes (Signed)
Cardiology Office Note    Date:  10/22/2017   ID:  Susan Barnett, DOB [birth year], MRN [id number]  PCP:  Shirline Frees, MD  Cardiologist: Sinclair Grooms, MD   Chief Complaint  Patient presents with  . Coronary Artery Disease    History of Present Illness:  Susan Barnett is a 72 y.o. female  who presents for CAD, recent CABG for repeat in-stent restenosis of right coronary, hypertension, diabetes mellitus, hyperlipidemia, and obstructive sleep apnea.   Susan Barnett has no specific cardiovascular complaints.  She is not exercising.  She denies angina.  Not needed nitroglycerin.  She has gained weight.   Past Medical History:  Diagnosis Date  . Anemia    mild  . Anginal pain ([place])    with exertion, last time prior to cardiac caht 06/02/14  . Anxiety    situational  . Arthritis   . CAD (coronary artery disease)    Dr. Linard Millers follows, no problems now  . Cancer [facility])    Breast cancer  . Complication of anesthesia   . Diabetes mellitus, type 2 ([place])    TYPE 2  . Dysrhythmia    occ skips a beat, rate was fast before beta blocker.  . Gastric polyp   . Genetic testing 04/12/2017   Ms. Amero underwent genetic counseling and testing for hereditary cancer syndromes on 02/19/2017. Her results were negative for mutations in all 46 genes analyzed by [facility]'s 46-gene Common Hereditary Cancers Panel. Genes analyzed include: APC, ATM, AXIN2, BARD1, BMPR1A, BRCA1, BRCA2, BRIP1, CDH1, CDKN2A, CHEK2, CTNNA1, DICER1, EPCAM, GREM1, HOXB13, KIT, MEN1, MLH1, MSH2, MSH3, MSH6, MUTYH, NBN  . GERD (gastroesophageal reflux disease)   . Headache    hx of  . Heartburn   . Hiatal hernia    mild head tremors  . History of radiation therapy 01/30/17- 02/21/17   Left Breast 42.56 Gy in 16 fractions  . Hypertension   . Hypothyroidism   . Occasional tremors    "of head", slight hands  . Osteopenia   . Pneumonia   . PONV (postoperative nausea and vomiting) 02/24/14  . Shortness of breath    with exertion  . Sleep apnea    cpap used nightly x 2 yrs now.    Past Surgical History:  Procedure Laterality Date  . BREAST BIOPSY Left 1987  . BREAST LUMPECTOMY WITH RADIOACTIVE SEED AND SENTINEL LYMPH NODE BIOPSY Left 12/25/2016   Procedure: BREAST LUMPECTOMY WITH RADIOACTIVE SEED AND SENTINEL LYMPH NODE BIOPSY;  Surgeon: [name], MD;  Location: [place];  Service: General;  Laterality: Left;  . CARDIAC CATHETERIZATION  06/02/2014   LAD 30%, D1 80%, CFX 755, RCA > 95% ISR, rx w/ PTCA, heavy calcification, EF 65%  . CORONARY ARTERY BYPASS GRAFT N/A 06/14/2014   Procedure: CORONARY ARTERY BYPASS GRAFTING (CABG);  Surgeon: Gaye Pollack, MD;  Location: [facility];  Service: Open Heart Surgery;  Laterality: N/A;  Times 2 using endoscopically harvested right saphenous vein.  . coronary stents     x3 stents placed-prior ablation  . DILATION AND CURETTAGE OF UTERUS    . ELBOW FRACTURE SURGERY Left   . INCONTINENCE SURGERY     sling  . KNEE ARTHROSCOPY Right 02/24/2014   Procedure: RIGHT KNEE ARTHROSCOPY WITH DEBRIDEMENT ;  Surgeon: Gearlean Alf, MD;  Location: [facility];  Service: Orthopedics;  Laterality: Right;  . LEFT HEART CATHETERIZATION WITH CORONARY ANGIOGRAM N/A 06/02/2014   Procedure: LEFT HEART CATHETERIZATION WITH CORONARY  Cyril Loosen;  Surgeon: Sinclair Grooms, MD;  Location: [facility] CATH [facility];  Service: Cardiovascular;  Laterality: N/A;  . TEE WITHOUT CARDIOVERSION N/A 06/14/2014   Procedure: TRANSESOPHAGEAL ECHOCARDIOGRAM (TEE);  Surgeon: Gaye Pollack, MD;  Location: [place];  Service: Open Heart Surgery;  Laterality: N/A;    Current Medications: Outpatient Medications Prior to Visit  Medication Sig Dispense Refill  . amLODipine (NORVASC) 5 MG tablet Take 5 mg by mouth daily.     Marland Kitchen anastrozole (ARIMIDEX) 1 MG tablet Take 1 tablet (1 mg total) by mouth daily. 90 tablet 3  . atorvastatin (LIPITOR) 10 MG tablet Take 10 mg by mouth every evening.     . cetirizine (ZYRTEC) 10 MG  tablet Take 10 mg by mouth daily.    . cloNIDine (CATAPRES) 0.1 MG tablet Take 0.1 mg by mouth every evening.     . clopidogrel (PLAVIX) 75 MG tablet Take 75 mg by mouth daily.    . Coenzyme Q10 300 MG CAPS Take 300 mg by mouth 2 (two) times daily.    Marland Kitchen LANTUS SOLOSTAR 100 UNIT/ML Solostar Pen Inject 32 Units into the skin at bedtime.   0  . levothyroxine (SYNTHROID, LEVOTHROID) 75 MCG tablet Take 75 mcg by mouth daily before breakfast.     . losartan-hydrochlorothiazide (HYZAAR) 100-25 MG tablet Take 1 tablet by mouth daily. 90 tablet 3  . metoprolol (LOPRESSOR) 50 MG tablet Take 1 tablet (50 mg total) by mouth 2 (two) times daily. 180 tablet 3  . Multiple Vitamin (MULTIVITAMIN WITH MINERALS) TABS tablet Take 1 tablet by mouth at bedtime.    Marland Kitchen NITROSTAT 0.4 MG SL tablet Place 1 tablet (0.4 mg total) under the tongue every 5 (five) minutes as needed for chest pain. 25 tablet 3  . pantoprazole (PROTONIX) 40 MG tablet Take 1 tablet (40 mg total) by mouth daily before breakfast. 90 tablet 3  . potassium chloride SA (K-DUR,KLOR-CON) 20 MEQ tablet Take 20 mEq by mouth daily.  2  . sitaGLIPtin-metformin (JANUMET) 50-1000 MG tablet Take 1 tablet by mouth 2 (two) times daily with a meal.     Facility-Administered Medications Prior to Visit  Medication Dose Route Frequency Provider Last Rate Last Dose  . 0.9 %  sodium chloride infusion  500 mL Intravenous Continuous Irene Shipper, MD         Allergies:   Asa [aspirin]; Compazine [prochlorperazine edisylate]; Nsaids; Shellfish allergy; Sulfa antibiotics; Zantac [ranitidine hcl]; and Lisinopril   Social History   Socioeconomic History  . Marital status: Married    Spouse name: None  . Number of children: None  . Years of education: None  . Highest education level: None  Social Needs  . Financial resource strain: None  . Food insecurity - worry: None  . Food insecurity - inability: None  . Transportation needs - medical: None  . Transportation  needs - non-medical: None  Occupational History  . None  Tobacco Use  . Smoking status: Never Smoker  . Smokeless tobacco: Never Used  Substance and Sexual Activity  . Alcohol use: No  . Drug use: No  . Sexual activity: Not Currently  Other Topics Concern  . None  Social History Narrative  . None     Family History:  The patient's family history includes Breast cancer (age of onset: 72) in her cousin; Diabetes in her mother; Healthy in her brother; Heart attack in her mother; Hodgkin's lymphoma in her maternal uncle; Hypertension in her mother; Lung  cancer in her brother; Ulcerative colitis in her daughter.   ROS:   Please see the history of present illness.    Some hearing difficulty.  Constipation and snoring.  Occasional palpitation.  Back and muscle pain also noted. All other systems reviewed and are negative.   PHYSICAL EXAM:   VS:  BP (!) 142/60   Pulse 67   Ht 5' 2"  (1.575 m)   Wt 164 lb 12.8 oz (74.8 kg)   BMI 30.14 kg/m    GEN: Well nourished, well developed, in no acute distress  HEENT: normal  Neck: no JVD, carotid bruits, or masses Cardiac: RRR; no murmurs, rubs, or gallops,no edema  Respiratory:  clear to auscultation bilaterally, normal work of breathing GI: soft, nontender, nondistended, + BS MS: no deformity or atrophy  Skin: warm and dry, no rash Neuro:  Alert and Oriented x 3, Strength and sensation are intact Psych: euthymic mood, full affect  Wt Readings from Last 3 Encounters:  10/22/17 164 lb 12.8 oz (74.8 kg)  08/27/17 161 lb 3.2 oz (73.1 kg)  08/20/17 158 lb (71.7 kg)      Studies/Labs Reviewed:   EKG:  EKG sinus rhythm.  Otherwise unremarkable.  Recent Labs: 02/19/2017: ALT 14 06/11/2017: BUN 11; Creatinine, Ser 0.67; Potassium 4.2; Sodium 133 08/15/2017: Hemoglobin 11.4; Platelets 354.0   Lipid Panel    Component Value Date/Time   CHOL  08/13/2007 0615    116        ATP III CLASSIFICATION:  <200     mg/dL   Desirable  200-239   mg/dL   Borderline High  >=240    mg/dL   High   TRIG 69 08/13/2007 0615   HDL 41 08/13/2007 0615   CHOLHDL 2.8 08/13/2007 0615   VLDL 14 08/13/2007 0615   LDLCALC  08/13/2007 0615    61        Total Cholesterol/HDL:CHD Risk Coronary Heart Disease Risk Table                     Men   Women  1/2 Average Risk   3.4   3.3    Additional studies/ records that were reviewed today include:  None    ASSESSMENT:    1. Coronary artery disease involving coronary bypass graft of native heart with angina pectoris ([place])   2. Essential hypertension   3. Other hyperlipidemia   4. Type 2 diabetes mellitus with complication, with long-term current use of insulin (H[facility])      PLAN:  In order of problems listed above:  1. Stable with out angina.  No change in current therapy. 2. Blood pressure target 130/80 mmHg is being achieved. 3. LDL target less than 70.  Most recent evaluation level of 76 mg/dL in June. 4. We discussed her increasing weight.  We discussed exercise.  Target hemoglobin A1c less than 7.    Medication Adjustments/Labs and Tests Ordered: Current medicines are reviewed at length with the patient today.  Concerns regarding medicines are outlined above.  Medication changes, Labs and Tests ordered today are listed in the Patient Instructions below. Patient Instructions  Medication Instructions:  Your physician recommends that you continue on your current medications as directed. Please refer to the Current Medication list given to you today.  Labwork: None  Testing/Procedures: None  Follow-Up: Your physician wants you to follow-up in: 1 year with Dr. Tamala Julian.  You will receive a reminder letter in the mail two months in advance.  If you don't receive a letter, please call our office to schedule the follow-up appointment.   Any Other Special Instructions Will Be Listed Below (If Applicable).  Your physician discussed the importance of regular exercise and recommended that  you start or continue a regular exercise program for good health.   Low-Sodium Eating Plan Sodium, which is an element that makes up salt, helps you maintain a healthy balance of fluids in your body. Too much sodium can increase your blood pressure and cause fluid and waste to be held in your body. Your health care provider or dietitian may recommend following this plan if you have high blood pressure (hypertension), kidney disease, liver disease, or heart failure. Eating less sodium can help lower your blood pressure, reduce swelling, and protect your heart, liver, and kidneys. What are tips for following this plan? General guidelines  Most people on this plan should limit their sodium intake to 1,500-2,000 mg (milligrams) of sodium each day. Reading food labels  The Nutrition Facts label lists the amount of sodium in one serving of the food. If you eat more than one serving, you must multiply the listed amount of sodium by the number of servings.  Choose foods with less than 140 mg of sodium per serving.  Avoid foods with 300 mg of sodium or more per serving. Shopping  Look for lower-sodium products, often labeled as "low-sodium" or "no salt added."  Always check the sodium content even if foods are labeled as "unsalted" or "no salt added".  Buy fresh foods. ? Avoid canned foods and premade or frozen meals. ? Avoid canned, cured, or processed meats  Buy breads that have less than 80 mg of sodium per slice. Cooking  Eat more home-cooked food and less restaurant, buffet, and fast food.  Avoid adding salt when cooking. Use salt-free seasonings or herbs instead of table salt or sea salt. Check with your health care provider or pharmacist before using salt substitutes.  Cook with plant-based oils, such as canola, sunflower, or olive oil. Meal planning  When eating at a restaurant, ask that your food be prepared with less salt or no salt, if possible.  Avoid foods that contain MSG  (monosodium glutamate). MSG is sometimes added to [place] food, bouillon, and some canned foods. What foods are recommended? The items listed may not be a complete list. Talk with your dietitian about what dietary choices are best for you. Grains Low-sodium cereals, including oats, puffed wheat and rice, and shredded wheat. Low-sodium crackers. Unsalted rice. Unsalted pasta. Low-sodium bread. Whole-grain breads and whole-grain pasta. Vegetables Fresh or frozen vegetables. "No salt added" canned vegetables. "No salt added" tomato sauce and paste. Low-sodium or reduced-sodium tomato and vegetable juice. Fruits Fresh, frozen, or canned fruit. Fruit juice. Meats and other protein foods Fresh or frozen (no salt added) meat, poultry, seafood, and fish. Low-sodium canned tuna and salmon. Unsalted nuts. Dried peas, beans, and lentils without added salt. Unsalted canned beans. Eggs. Unsalted nut butters. Dairy Milk. Soy milk. Cheese that is naturally low in sodium, such as ricotta cheese, fresh mozzarella, or Swiss cheese Low-sodium or reduced-sodium cheese. Cream cheese. Yogurt. Fats and oils Unsalted butter. Unsalted margarine with no trans fat. Vegetable oils such as canola or olive oils. Seasonings and other foods Fresh and dried herbs and spices. Salt-free seasonings. Low-sodium mustard and ketchup. Sodium-free salad dressing. Sodium-free light mayonnaise. Fresh or refrigerated horseradish. Lemon juice. Vinegar. Homemade, reduced-sodium, or low-sodium soups. Unsalted popcorn and pretzels. Low-salt or salt-free chips. What  foods are not recommended? The items listed may not be a complete list. Talk with your dietitian about what dietary choices are best for you. Grains Instant hot cereals. Bread stuffing, pancake, and biscuit mixes. Croutons. Seasoned rice or pasta mixes. Noodle soup cups. Boxed or frozen macaroni and cheese. Regular salted crackers. Self-rising flour. Vegetables Sauerkraut, pickled  vegetables, and relishes. Olives. [place] fries. Onion rings. Regular canned vegetables (not low-sodium or reduced-sodium). Regular canned tomato sauce and paste (not low-sodium or reduced-sodium). Regular tomato and vegetable juice (not low-sodium or reduced-sodium). Frozen vegetables in sauces. Meats and other protein foods Meat or fish that is salted, canned, smoked, spiced, or pickled. Bacon, ham, sausage, hotdogs, corned beef, chipped beef, packaged lunch meats, salt pork, jerky, pickled herring, anchovies, regular canned tuna, sardines, salted nuts. Dairy Processed cheese and cheese spreads. Cheese curds. Blue cheese. Feta cheese. String cheese. Regular cottage cheese. Buttermilk. Canned milk. Fats and oils Salted butter. Regular margarine. Ghee. Bacon fat. Seasonings and other foods Onion salt, garlic salt, seasoned salt, table salt, and sea salt. Canned and packaged gravies. Worcestershire sauce. Tartar sauce. Barbecue sauce. Teriyaki sauce. Soy sauce, including reduced-sodium. Steak sauce. Fish sauce. Oyster sauce. Cocktail sauce. Horseradish that you find on the shelf. Regular ketchup and mustard. Meat flavorings and tenderizers. Bouillon cubes. Hot sauce and Tabasco sauce. Premade or packaged marinades. Premade or packaged taco seasonings. Relishes. Regular salad dressings. Salsa. Potato and tortilla chips. Corn chips and puffs. Salted popcorn and pretzels. Canned or dried soups. Pizza. Frozen entrees and pot pies. Summary  Eating less sodium can help lower your blood pressure, reduce swelling, and protect your heart, liver, and kidneys.  Most people on this plan should limit their sodium intake to 1,500-2,000 mg (milligrams) of sodium each day.  Canned, boxed, and frozen foods are high in sodium. Restaurant foods, fast foods, and pizza are also very high in sodium. You also get sodium by adding salt to food.  Try to cook at home, eat more fresh fruits and vegetables, and eat less fast  food, canned, processed, or prepared foods. This information is not intended to replace advice given to you by your health care provider. Make sure you discuss any questions you have with your health care provider. Document Released: 01/19/2002 Document Revised: 07/23/2016 Document Reviewed: 07/23/2016 Elsevier Interactive Patient Education  [employer].    If you need a refill on your cardiac medications before your next appointment, please call your pharmacy.      Signed, Sinclair Grooms, MD  10/22/2017 11:28 AM    [address], [place], [place]  [address] Phone: [phone]; Fax: [phone]

## 2017-10-22 ENCOUNTER — Encounter: Payer: Self-pay | Admitting: Interventional Cardiology

## 2017-10-22 ENCOUNTER — Ambulatory Visit: Payer: Medicare Other | Admitting: Interventional Cardiology

## 2017-10-22 VITALS — BP 142/60 | HR 67 | Ht 62.0 in | Wt 164.8 lb

## 2017-10-22 DIAGNOSIS — I10 Essential (primary) hypertension: Secondary | ICD-10-CM | POA: Diagnosis not present

## 2017-10-22 DIAGNOSIS — E7849 Other hyperlipidemia: Secondary | ICD-10-CM | POA: Diagnosis not present

## 2017-10-22 DIAGNOSIS — E118 Type 2 diabetes mellitus with unspecified complications: Secondary | ICD-10-CM

## 2017-10-22 DIAGNOSIS — I25709 Atherosclerosis of coronary artery bypass graft(s), unspecified, with unspecified angina pectoris: Secondary | ICD-10-CM

## 2017-10-22 DIAGNOSIS — Z794 Long term (current) use of insulin: Secondary | ICD-10-CM | POA: Diagnosis not present

## 2017-10-22 NOTE — Patient Instructions (Signed)
Medication Instructions:  Your physician recommends that you continue on your current medications as directed. Please refer to the Current Medication list given to you today.  Labwork: None  Testing/Procedures: None  Follow-Up: Your physician wants you to follow-up in: 1 year with Dr. Tamala Julian.  You will receive a reminder letter in the mail two months in advance. If you don't receive a letter, please call our office to schedule the follow-up appointment.   Any Other Special Instructions Will Be Listed Below (If Applicable).  Your physician discussed the importance of regular exercise and recommended that you start or continue a regular exercise program for good health.   Low-Sodium Eating Plan Sodium, which is an element that makes up salt, helps you maintain a healthy balance of fluids in your body. Too much sodium can increase your blood pressure and cause fluid and waste to be held in your body. Your health care provider or dietitian may recommend following this plan if you have high blood pressure (hypertension), kidney disease, liver disease, or heart failure. Eating less sodium can help lower your blood pressure, reduce swelling, and protect your heart, liver, and kidneys. What are tips for following this plan? General guidelines  Most people on this plan should limit their sodium intake to 1,500-2,000 mg (milligrams) of sodium each day. Reading food labels  The Nutrition Facts label lists the amount of sodium in one serving of the food. If you eat more than one serving, you must multiply the listed amount of sodium by the number of servings.  Choose foods with less than 140 mg of sodium per serving.  Avoid foods with 300 mg of sodium or more per serving. Shopping  Look for lower-sodium products, often labeled as "low-sodium" or "no salt added."  Always check the sodium content even if foods are labeled as "unsalted" or "no salt added".  Buy fresh foods. ? Avoid canned  foods and premade or frozen meals. ? Avoid canned, cured, or processed meats  Buy breads that have less than 80 mg of sodium per slice. Cooking  Eat more home-cooked food and less restaurant, buffet, and fast food.  Avoid adding salt when cooking. Use salt-free seasonings or herbs instead of table salt or sea salt. Check with your health care provider or pharmacist before using salt substitutes.  Cook with plant-based oils, such as canola, sunflower, or olive oil. Meal planning  When eating at a restaurant, ask that your food be prepared with less salt or no salt, if possible.  Avoid foods that contain MSG (monosodium glutamate). MSG is sometimes added to [place] food, bouillon, and some canned foods. What foods are recommended? The items listed may not be a complete list. Talk with your dietitian about what dietary choices are best for you. Grains Low-sodium cereals, including oats, puffed wheat and rice, and shredded wheat. Low-sodium crackers. Unsalted rice. Unsalted pasta. Low-sodium bread. Whole-grain breads and whole-grain pasta. Vegetables Fresh or frozen vegetables. "No salt added" canned vegetables. "No salt added" tomato sauce and paste. Low-sodium or reduced-sodium tomato and vegetable juice. Fruits Fresh, frozen, or canned fruit. Fruit juice. Meats and other protein foods Fresh or frozen (no salt added) meat, poultry, seafood, and fish. Low-sodium canned tuna and salmon. Unsalted nuts. Dried peas, beans, and lentils without added salt. Unsalted canned beans. Eggs. Unsalted nut butters. Dairy Milk. Soy milk. Cheese that is naturally low in sodium, such as ricotta cheese, fresh mozzarella, or Swiss cheese Low-sodium or reduced-sodium cheese. Cream cheese. Yogurt. Fats and oils  Unsalted butter. Unsalted margarine with no trans fat. Vegetable oils such as canola or olive oils. Seasonings and other foods Fresh and dried herbs and spices. Salt-free seasonings. Low-sodium mustard  and ketchup. Sodium-free salad dressing. Sodium-free light mayonnaise. Fresh or refrigerated horseradish. Lemon juice. Vinegar. Homemade, reduced-sodium, or low-sodium soups. Unsalted popcorn and pretzels. Low-salt or salt-free chips. What foods are not recommended? The items listed may not be a complete list. Talk with your dietitian about what dietary choices are best for you. Grains Instant hot cereals. Bread stuffing, pancake, and biscuit mixes. Croutons. Seasoned rice or pasta mixes. Noodle soup cups. Boxed or frozen macaroni and cheese. Regular salted crackers. Self-rising flour. Vegetables Sauerkraut, pickled vegetables, and relishes. Olives. [place] fries. Onion rings. Regular canned vegetables (not low-sodium or reduced-sodium). Regular canned tomato sauce and paste (not low-sodium or reduced-sodium). Regular tomato and vegetable juice (not low-sodium or reduced-sodium). Frozen vegetables in sauces. Meats and other protein foods Meat or fish that is salted, canned, smoked, spiced, or pickled. Bacon, ham, sausage, hotdogs, corned beef, chipped beef, packaged lunch meats, salt pork, jerky, pickled herring, anchovies, regular canned tuna, sardines, salted nuts. Dairy Processed cheese and cheese spreads. Cheese curds. Blue cheese. Feta cheese. String cheese. Regular cottage cheese. Buttermilk. Canned milk. Fats and oils Salted butter. Regular margarine. Ghee. Bacon fat. Seasonings and other foods Onion salt, garlic salt, seasoned salt, table salt, and sea salt. Canned and packaged gravies. Worcestershire sauce. Tartar sauce. Barbecue sauce. Teriyaki sauce. Soy sauce, including reduced-sodium. Steak sauce. Fish sauce. Oyster sauce. Cocktail sauce. Horseradish that you find on the shelf. Regular ketchup and mustard. Meat flavorings and tenderizers. Bouillon cubes. Hot sauce and Tabasco sauce. Premade or packaged marinades. Premade or packaged taco seasonings. Relishes. Regular salad dressings. Salsa.  Potato and tortilla chips. Corn chips and puffs. Salted popcorn and pretzels. Canned or dried soups. Pizza. Frozen entrees and pot pies. Summary  Eating less sodium can help lower your blood pressure, reduce swelling, and protect your heart, liver, and kidneys.  Most people on this plan should limit their sodium intake to 1,500-2,000 mg (milligrams) of sodium each day.  Canned, boxed, and frozen foods are high in sodium. Restaurant foods, fast foods, and pizza are also very high in sodium. You also get sodium by adding salt to food.  Try to cook at home, eat more fresh fruits and vegetables, and eat less fast food, canned, processed, or prepared foods. This information is not intended to replace advice given to you by your health care provider. Make sure you discuss any questions you have with your health care provider. Document Released: 01/19/2002 Document Revised: 07/23/2016 Document Reviewed: 07/23/2016 Elsevier Interactive Patient Education  [employer].    If you need a refill on your cardiac medications before your next appointment, please call your pharmacy.

## 2017-10-30 DIAGNOSIS — G4733 Obstructive sleep apnea (adult) (pediatric): Secondary | ICD-10-CM | POA: Diagnosis not present

## 2017-11-01 DIAGNOSIS — G4733 Obstructive sleep apnea (adult) (pediatric): Secondary | ICD-10-CM | POA: Diagnosis not present

## 2017-11-01 DIAGNOSIS — B354 Tinea corporis: Secondary | ICD-10-CM | POA: Diagnosis not present

## 2017-11-01 DIAGNOSIS — E7800 Pure hypercholesterolemia, unspecified: Secondary | ICD-10-CM | POA: Diagnosis not present

## 2017-11-01 DIAGNOSIS — I10 Essential (primary) hypertension: Secondary | ICD-10-CM | POA: Diagnosis not present

## 2017-11-19 ENCOUNTER — Inpatient Hospital Stay: Payer: Medicare Other | Attending: Hematology and Oncology

## 2017-11-19 ENCOUNTER — Ambulatory Visit: Payer: Medicare Other | Admitting: Hematology and Oncology

## 2017-11-19 DIAGNOSIS — Z923 Personal history of irradiation: Secondary | ICD-10-CM | POA: Insufficient documentation

## 2017-11-19 DIAGNOSIS — Z79811 Long term (current) use of aromatase inhibitors: Secondary | ICD-10-CM | POA: Diagnosis not present

## 2017-11-19 DIAGNOSIS — C50412 Malignant neoplasm of upper-outer quadrant of left female breast: Secondary | ICD-10-CM | POA: Insufficient documentation

## 2017-11-19 DIAGNOSIS — Z794 Long term (current) use of insulin: Secondary | ICD-10-CM | POA: Diagnosis not present

## 2017-11-19 DIAGNOSIS — Z170 Estrogen receptor positive status [ER+]: Secondary | ICD-10-CM | POA: Diagnosis not present

## 2017-11-19 DIAGNOSIS — D509 Iron deficiency anemia, unspecified: Secondary | ICD-10-CM | POA: Diagnosis not present

## 2017-11-19 DIAGNOSIS — Z79899 Other long term (current) drug therapy: Secondary | ICD-10-CM | POA: Diagnosis not present

## 2017-11-19 LAB — CBC WITH DIFFERENTIAL (CANCER CENTER ONLY)
Basophils Absolute: 0 10*3/uL (ref 0.0–0.1)
Basophils Relative: 0 %
Eosinophils Absolute: 0.4 10*3/uL (ref 0.0–0.5)
Eosinophils Relative: 5 %
HCT: 41 % (ref 34.8–46.6)
Hemoglobin: 14 g/dL (ref 11.6–15.9)
Lymphocytes Relative: 15 %
Lymphs Abs: 1.2 10*3/uL (ref 0.9–3.3)
MCH: 30.4 pg (ref 25.1–34.0)
MCHC: 34.1 g/dL (ref 31.5–36.0)
MCV: 89.1 fL (ref 79.5–101.0)
Monocytes Absolute: 0.5 10*3/uL (ref 0.1–0.9)
Monocytes Relative: 7 %
Neutro Abs: 5.9 10*3/uL (ref 1.5–6.5)
Neutrophils Relative %: 73 %
Platelet Count: 265 10*3/uL (ref 145–400)
RBC: 4.6 MIL/uL (ref 3.70–5.45)
RDW: 16 % — ABNORMAL HIGH (ref 11.2–14.5)
WBC Count: 8.1 10*3/uL (ref 3.9–10.3)

## 2017-11-19 LAB — IRON AND TIBC
Iron: 74 ug/dL (ref 41–142)
Saturation Ratios: 26 % (ref 21–57)
TIBC: 289 ug/dL (ref 236–444)
UIBC: 215 ug/dL

## 2017-11-19 LAB — FERRITIN: Ferritin: 273 ng/mL — ABNORMAL HIGH (ref 9–269)

## 2017-11-19 NOTE — Assessment & Plan Note (Signed)
Left breast biopsy02/19/2018: 12:30: IDC with DCIS involving underlying papillary lesion, grade 1, ER 100%, PR 90%, Ki-67 10%, HER-2 negative ratio 1.29; irregular 1.9 cm mass axilla negative, T1 CN 0 stage IA clinical stage Left lumpectomy 12/25/2016: IDC grade 1, 1.5 cm, low-grade DCIS, resection margins negative, 0/2 lymph nodes negative, ER 100%, PR 100%, HER-2 negative ratio 1.29, Ki-67 10% T1 cN0 stage IA Oncotype DX score 2: 4% risk of recurrence  Adjuvant radiation therapy 01/30/2017-02/21/2017  Treatment plan: Anastrozole 1 mg by mouth daily 5 years started 02/21/17  Anastrozole Toxicities: Complains of diffuse musculoskeletal aches and pains in the shoulders hips and knees.  She attributes this to arthritis.  Surveillance: 1. Breast Exam: benign 2. Mammogram:  RTC in 1 year for follow up

## 2017-11-20 ENCOUNTER — Inpatient Hospital Stay: Payer: Medicare Other | Admitting: Hematology and Oncology

## 2017-11-20 ENCOUNTER — Telehealth: Payer: Self-pay | Admitting: Hematology and Oncology

## 2017-11-20 VITALS — BP 114/89 | HR 66 | Temp 97.8°F | Resp 18 | Ht 62.0 in | Wt 163.5 lb

## 2017-11-20 DIAGNOSIS — Z79899 Other long term (current) drug therapy: Secondary | ICD-10-CM | POA: Diagnosis not present

## 2017-11-20 DIAGNOSIS — Z170 Estrogen receptor positive status [ER+]: Secondary | ICD-10-CM | POA: Diagnosis not present

## 2017-11-20 DIAGNOSIS — C50412 Malignant neoplasm of upper-outer quadrant of left female breast: Secondary | ICD-10-CM | POA: Diagnosis not present

## 2017-11-20 DIAGNOSIS — Z923 Personal history of irradiation: Secondary | ICD-10-CM

## 2017-11-20 DIAGNOSIS — Z79811 Long term (current) use of aromatase inhibitors: Secondary | ICD-10-CM

## 2017-11-20 DIAGNOSIS — Z794 Long term (current) use of insulin: Secondary | ICD-10-CM | POA: Diagnosis not present

## 2017-11-20 DIAGNOSIS — D509 Iron deficiency anemia, unspecified: Secondary | ICD-10-CM | POA: Diagnosis not present

## 2017-11-20 MED ORDER — LORATADINE 10 MG PO TABS: 10.0000 mg | ORAL_TABLET | Freq: Every day | ORAL | Status: DC

## 2017-11-20 NOTE — Progress Notes (Signed)
Patient Care Team: Susan Frees, MD as PCP - General (Family Medicine) Delice Bison, Charlestine Massed, NP as Nurse Practitioner (Hematology and Oncology) [name], MD as Consulting Physician (Hematology and Oncology) Eppie Gibson, MD as Attending Physician (Radiation Oncology) [name], MD as Consulting Physician (General Surgery)  DIAGNOSIS:  Encounter Diagnosis  Name Primary?  . Malignant neoplasm of upper-outer quadrant of left breast in female, estrogen receptor positive ([place])     SUMMARY OF ONCOLOGIC HISTORY:   Malignant neoplasm of upper-outer quadrant of left breast in female, estrogen receptor positive ([place])   10/01/2016 Initial Diagnosis    Left breast biopsy: 12:30: IDC with DCIS involving underlying papillary lesion, grade 1, ER 100%, PR 90%, Ki-67 10%, HER-2 negative ratio 1.29; irregular 1.9 cm mass axilla negative, T1 CN 0 stage IA clinical stage      12/25/2016 Surgery    Left lumpectomy: IDC grade 1, 1.5 cm, low-grade DCIS, resection margins negative, 0/2 lymph nodes negative, ER 100%, PR 100%, HER-2 negative ratio 1.29, Ki-67 10% T1 cN0 stage IA      12/25/2016 Oncotype testing    Oncotype DX score 2: Risk of recurrence 4%      01/30/2017 - 02/21/2017 Radiation Therapy    Adjuvant radiation therapy      02/2017 -  Anti-estrogen oral therapy    Anastrozole daily      03/25/2017 Genetic Testing    Genetic counseling and testing for hereditary cancer syndromes performed on 02/19/2017. Results are negative for pathogenic mutations in 46 genes analyzed by [facility]'s Common Hereditary Cancers Panel. Results are dated 03/25/2017. Genes tested: APC, ATM, AXIN2, BARD1, BMPR1A, BRCA1, BRCA2, BRIP1, CDH1, CDKN2A, CHEK2, CTNNA1, DICER1, EPCAM, GREM1, HOXB13, KIT, MEN1, MLH1, MSH2, MSH3, MSH6, MUTYH, NBN, NF1, NTHL1, PALB2, PDGFRA, PMS2, POLD1, POLE, PTEN, RAD50, RAD51C, RAD51D, SDHA, SDHB, SDHC, SDHD, SMAD4, SMARCA4, STK11, TP53, TSC1, TSC2, and VHL.           CHIEF COMPLIANT: Follow-up on anastrozole therapy complaining of breast pain  INTERVAL HISTORY: Susan Barnett is a 72 year old with above-mentioned history of left breast cancer treated with lumpectomy and radiation is currently on anastrozole therapy since July 2018.  She has chronic arthritis but otherwise no major problems or side effects from anastrozole.  She denies any hot flashes or arthralgias or myalgias.  She is lately been experiencing intermittent breast discomfort along the scar tissues.  She also feels that the scar feels lumpy.  She has not had a mammogram yet this year.  REVIEW OF SYSTEMS:   Constitutional: Denies fevers, chills or abnormal weight loss Eyes: Denies blurriness of vision Ears, nose, mouth, throat, and face: Denies mucositis or sore throat Respiratory: Denies cough, dyspnea or wheezes Cardiovascular: Denies palpitation, chest discomfort Gastrointestinal:  Denies nausea, heartburn or change in bowel habits Skin: Denies abnormal skin rashes Lymphatics: Denies new lymphadenopathy or easy bruising Neurological:Denies numbness, tingling or new weaknesses Behavioral/Psych: Mood is stable, no new changes  Extremities: No lower extremity edema Breast: Left breast discomfort All other systems were reviewed with the patient and are negative.  I have reviewed the past medical history, past surgical history, social history and family history with the patient and they are unchanged from previous note.  ALLERGIES:  is allergic to asa [aspirin]; compazine [prochlorperazine edisylate]; nsaids; shellfish allergy; sulfa antibiotics; zantac [ranitidine hcl]; and lisinopril.  MEDICATIONS:  Current Outpatient Medications  Medication Sig Dispense Refill  . amLODipine (NORVASC) 5 MG tablet Take 5 mg by mouth daily.     Marland Kitchen  anastrozole (ARIMIDEX) 1 MG tablet Take 1 tablet (1 mg total) by mouth daily. 90 tablet 3  . atorvastatin (LIPITOR) 10 MG tablet Take 10 mg by mouth every  evening.     . cetirizine (ZYRTEC) 10 MG tablet Take 10 mg by mouth daily.    . cloNIDine (CATAPRES) 0.1 MG tablet Take 0.1 mg by mouth every evening.     . clopidogrel (PLAVIX) 75 MG tablet Take 75 mg by mouth daily.    . Coenzyme Q10 300 MG CAPS Take 300 mg by mouth 2 (two) times daily.    Marland Kitchen LANTUS SOLOSTAR 100 UNIT/ML Solostar Pen Inject 32 Units into the skin at bedtime.   0  . levothyroxine (SYNTHROID, LEVOTHROID) 75 MCG tablet Take 75 mcg by mouth daily before breakfast.     . losartan-hydrochlorothiazide (HYZAAR) 100-25 MG tablet Take 1 tablet by mouth daily. 90 tablet 3  . metoprolol (LOPRESSOR) 50 MG tablet Take 1 tablet (50 mg total) by mouth 2 (two) times daily. 180 tablet 3  . Multiple Vitamin (MULTIVITAMIN WITH MINERALS) TABS tablet Take 1 tablet by mouth at bedtime.    Marland Kitchen NITROSTAT 0.4 MG SL tablet Place 1 tablet (0.4 mg total) under the tongue every 5 (five) minutes as needed for chest pain. 25 tablet 3  . pantoprazole (PROTONIX) 40 MG tablet Take 1 tablet (40 mg total) by mouth daily before breakfast. 90 tablet 3  . potassium chloride SA (K-DUR,KLOR-CON) 20 MEQ tablet Take 20 mEq by mouth daily.  2  . sitaGLIPtin-metformin (JANUMET) 50-1000 MG tablet Take 1 tablet by mouth 2 (two) times daily with a meal.     Current Facility-Administered Medications  Medication Dose Route Frequency Provider Last Rate Last Dose  . 0.9 %  sodium chloride infusion  500 mL Intravenous Continuous Irene Shipper, MD        PHYSICAL EXAMINATION: ECOG PERFORMANCE STATUS: 1 - Symptomatic but completely ambulatory  Vitals:   11/20/17 0910  BP: 114/89  Pulse: 66  Resp: 18  Temp: 97.8 F (36.6 C)  SpO2: 100%   Filed Weights   11/20/17 0910  Weight: 163 lb 8 oz (74.2 kg)    GENERAL:alert, no distress and comfortable SKIN: skin color, texture, turgor are normal, no rashes or significant lesions EYES: normal, Conjunctiva are pink and non-injected, sclera clear OROPHARYNX:no exudate, no  erythema and lips, buccal mucosa, and tongue normal  NECK: supple, thyroid normal size, non-tender, without nodularity LYMPH:  no palpable lymphadenopathy in the cervical, axillary or inguinal LUNGS: clear to auscultation and percussion with normal breathing effort HEART: regular rate & rhythm and no murmurs and no lower extremity edema ABDOMEN:abdomen soft, non-tender and normal bowel sounds MUSCULOSKELETAL:no cyanosis of digits and no clubbing  NEURO: alert & oriented x 3 with fluent speech, no focal motor/sensory deficits EXTREMITIES: No lower extremity edema BREAST: Surgical scars are noted on the left breast no palpable masses or nodules in either right or left breasts. No palpable axillary supraclavicular or infraclavicular adenopathy no breast tenderness or nipple discharge. (exam performed in the presence of a chaperone)  LABORATORY DATA:  I have reviewed the data as listed CMP Latest Ref Rng & Units 06/11/2017 05/16/2017 05/07/2017  Glucose 65 - 99 mg/dL 169(H) 164(H) 158(H)  BUN 8 - 27 mg/dL 11 15 10   Creatinine 0.57 - 1.00 mg/dL 0.67 0.72 0.71  Sodium 134 - 144 mmol/L 133(L) 133(L) 129(L)  Potassium 3.5 - 5.2 mmol/L 4.2 4.1 3.9  Chloride 96 - 106 mmol/L  92(L) 94(L) 91(L)  CO2 20 - 29 mmol/L 27 21 24   Calcium 8.7 - 10.3 mg/dL 9.8 9.7 9.2  Total Protein 6.5 - 8.1 g/dL - - -  Total Bilirubin 0.3 - 1.2 mg/dL - - -  Alkaline Phos 38 - 126 U/L - - -  AST 15 - 41 U/L - - -  ALT 14 - 54 U/L - - -    Lab Results  Component Value Date   WBC 8.1 11/19/2017   HGB 11.4 (L) 08/15/2017   HCT 41.0 11/19/2017   MCV 89.1 11/19/2017   PLT 265 11/19/2017   NEUTROABS 5.9 11/19/2017    ASSESSMENT & PLAN:  Malignant neoplasm of upper-outer quadrant of left breast in female, estrogen receptor positive ([facility]) Left breast biopsy02/19/2018: 12:30: IDC with DCIS involving underlying papillary lesion, grade 1, ER 100%, PR 90%, Ki-67 10%, HER-2 negative ratio 1.29; irregular 1.9 cm mass axilla  negative, T1 CN 0 stage IA clinical stage Left lumpectomy 12/25/2016: IDC grade 1, 1.5 cm, low-grade DCIS, resection margins negative, 0/2 lymph nodes negative, ER 100%, PR 100%, HER-2 negative ratio 1.29, Ki-67 10% T1 cN0 stage IA Oncotype DX score 2: 4% risk of recurrence  Adjuvant radiation therapy 01/30/2017-02/21/2017  Treatment plan: Anastrozole 1 mg by mouth daily 5 years started 02/21/17  Anastrozole Toxicities: Complains of diffuse musculoskeletal aches and pains in the shoulders hips and knees.  She attributes this to arthritis.  Surveillance: 1. Breast Exam: benign, surgical scars 2. Mammogram:  RTC in 1 year for follow up   Iron Deficiency anemia status post IV iron therapy 3 months ago Iron studies were fantastic today. Return to clinic in 6 months with labs and follow-up  No orders of the defined types were placed in this encounter.  The patient has a good understanding of the overall plan. she agrees with it. she will call with any problems that may develop before the next visit here.   Harriette Ohara, MD 11/20/17

## 2017-11-20 NOTE — Telephone Encounter (Signed)
Gave avs and calendar ° °

## 2017-11-21 ENCOUNTER — Other Ambulatory Visit: Payer: Medicare Other

## 2017-11-22 ENCOUNTER — Ambulatory Visit: Payer: Medicare Other | Admitting: Hematology and Oncology

## 2017-11-29 ENCOUNTER — Ambulatory Visit
Admission: RE | Admit: 2017-11-29 | Discharge: 2017-11-29 | Disposition: A | Discharge status: Home / Self Care | Payer: Medicare Other | Source: Ambulatory Visit | Attending: Hematology and Oncology | Admitting: Hematology and Oncology

## 2017-11-29 ENCOUNTER — Other Ambulatory Visit: Payer: Self-pay | Admitting: Hematology and Oncology

## 2017-11-29 DIAGNOSIS — Z170 Estrogen receptor positive status [ER+]: Principal | ICD-10-CM

## 2017-11-29 DIAGNOSIS — C50412 Malignant neoplasm of upper-outer quadrant of left female breast: Secondary | ICD-10-CM

## 2017-11-29 DIAGNOSIS — R921 Mammographic calcification found on diagnostic imaging of breast: Secondary | ICD-10-CM | POA: Diagnosis not present

## 2017-11-29 HISTORY — DX: Personal history of irradiation: Z92.3

## 2017-12-05 ENCOUNTER — Ambulatory Visit
Admission: RE | Admit: 2017-12-05 | Discharge: 2017-12-05 | Disposition: A | Discharge status: Home / Self Care | Payer: Medicare Other | Source: Ambulatory Visit | Attending: Hematology and Oncology | Admitting: Hematology and Oncology

## 2017-12-05 ENCOUNTER — Other Ambulatory Visit: Payer: Self-pay | Admitting: Hematology and Oncology

## 2017-12-05 DIAGNOSIS — C50412 Malignant neoplasm of upper-outer quadrant of left female breast: Secondary | ICD-10-CM

## 2017-12-05 DIAGNOSIS — R921 Mammographic calcification found on diagnostic imaging of breast: Secondary | ICD-10-CM

## 2017-12-05 DIAGNOSIS — D242 Benign neoplasm of left breast: Secondary | ICD-10-CM | POA: Diagnosis not present

## 2017-12-05 DIAGNOSIS — Z170 Estrogen receptor positive status [ER+]: Principal | ICD-10-CM

## 2018-01-30 DIAGNOSIS — E1129 Type 2 diabetes mellitus with other diabetic kidney complication: Secondary | ICD-10-CM | POA: Diagnosis not present

## 2018-01-30 DIAGNOSIS — N08 Glomerular disorders in diseases classified elsewhere: Secondary | ICD-10-CM | POA: Diagnosis not present

## 2018-01-30 DIAGNOSIS — C50412 Malignant neoplasm of upper-outer quadrant of left female breast: Secondary | ICD-10-CM | POA: Diagnosis not present

## 2018-01-30 DIAGNOSIS — D508 Other iron deficiency anemias: Secondary | ICD-10-CM | POA: Diagnosis not present

## 2018-02-04 DIAGNOSIS — C50912 Malignant neoplasm of unspecified site of left female breast: Secondary | ICD-10-CM | POA: Diagnosis not present

## 2018-02-06 DIAGNOSIS — E1142 Type 2 diabetes mellitus with diabetic polyneuropathy: Secondary | ICD-10-CM | POA: Diagnosis not present

## 2018-02-12 DIAGNOSIS — M79671 Pain in right foot: Secondary | ICD-10-CM | POA: Diagnosis not present

## 2018-02-12 DIAGNOSIS — I10 Essential (primary) hypertension: Secondary | ICD-10-CM | POA: Diagnosis not present

## 2018-02-12 DIAGNOSIS — M79672 Pain in left foot: Secondary | ICD-10-CM | POA: Diagnosis not present

## 2018-02-12 DIAGNOSIS — Z0000 Encounter for general adult medical examination without abnormal findings: Secondary | ICD-10-CM | POA: Diagnosis not present

## 2018-02-17 ENCOUNTER — Other Ambulatory Visit: Payer: Self-pay | Admitting: Family Medicine

## 2018-02-17 DIAGNOSIS — M79671 Pain in right foot: Secondary | ICD-10-CM

## 2018-02-18 ENCOUNTER — Other Ambulatory Visit: Payer: Self-pay | Admitting: Family Medicine

## 2018-02-18 DIAGNOSIS — M79671 Pain in right foot: Secondary | ICD-10-CM

## 2018-02-24 ENCOUNTER — Other Ambulatory Visit: Payer: Self-pay | Admitting: Hematology and Oncology

## 2018-02-24 DIAGNOSIS — Z170 Estrogen receptor positive status [ER+]: Principal | ICD-10-CM

## 2018-02-24 DIAGNOSIS — C50412 Malignant neoplasm of upper-outer quadrant of left female breast: Secondary | ICD-10-CM

## 2018-02-25 ENCOUNTER — Other Ambulatory Visit: Payer: Medicare Other

## 2018-02-26 ENCOUNTER — Ambulatory Visit
Admission: RE | Admit: 2018-02-26 | Discharge: 2018-02-26 | Disposition: A | Discharge status: Home / Self Care | Payer: Medicare Other | Source: Ambulatory Visit | Attending: Family Medicine | Admitting: Family Medicine

## 2018-02-26 DIAGNOSIS — M79671 Pain in right foot: Secondary | ICD-10-CM

## 2018-02-26 DIAGNOSIS — M79672 Pain in left foot: Secondary | ICD-10-CM | POA: Diagnosis not present

## 2018-03-07 IMAGING — MG BREAST SURGICAL SPECIMEN
1 series · 1 of 1 positions shown · non-contrast
Comparison: Previous exam(s).

CLINICAL DATA: Patient status post left breast lumpectomy.

EXAM:
SPECIMEN RADIOGRAPH OF THE LEFT BREAST

[L]
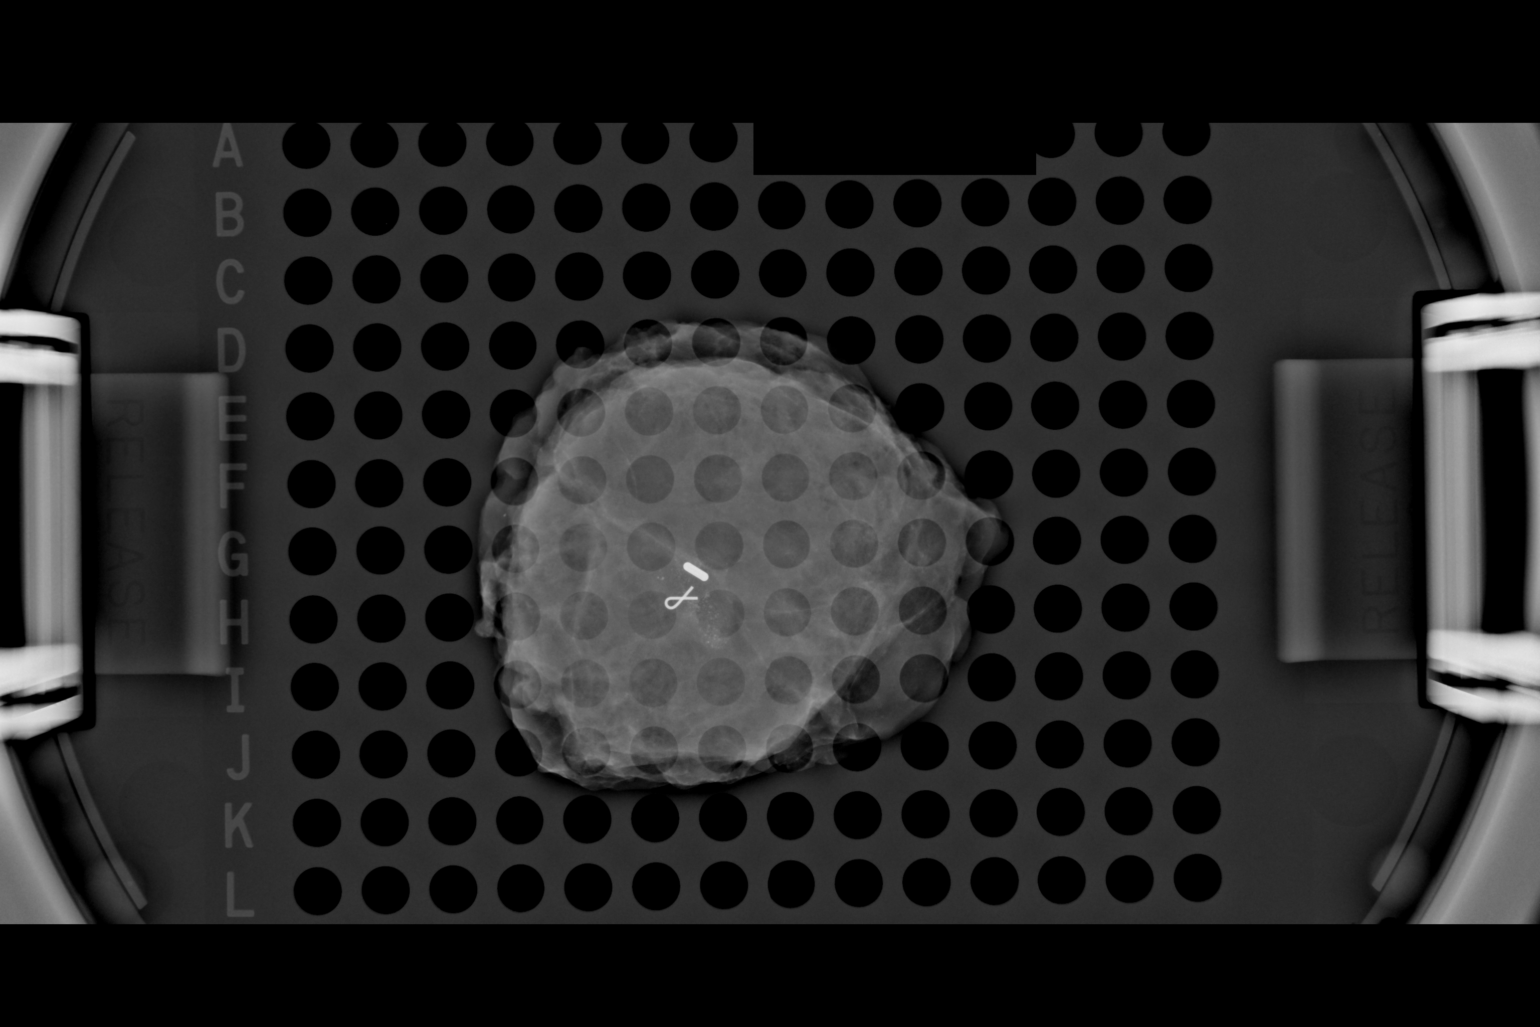

[1 of 1 positions shown; findings below may reference images not displayed]

FINDINGS: Status post excision of the left breast. The radioactive seed and
biopsy marker clip are present, completely intact, and were marked
for pathology.
IMPRESSION: Specimen radiograph of the left breast.

## 2018-04-07 DIAGNOSIS — M1811 Unilateral primary osteoarthritis of first carpometacarpal joint, right hand: Secondary | ICD-10-CM | POA: Diagnosis not present

## 2018-04-07 DIAGNOSIS — M654 Radial styloid tenosynovitis [de Quervain]: Secondary | ICD-10-CM | POA: Diagnosis not present

## 2018-04-07 DIAGNOSIS — M189 Osteoarthritis of first carpometacarpal joint, unspecified: Secondary | ICD-10-CM | POA: Diagnosis not present

## 2018-04-16 ENCOUNTER — Other Ambulatory Visit: Payer: Self-pay | Admitting: Nurse Practitioner

## 2018-04-21 DIAGNOSIS — H2513 Age-related nuclear cataract, bilateral: Secondary | ICD-10-CM | POA: Diagnosis not present

## 2018-04-21 DIAGNOSIS — H25013 Cortical age-related cataract, bilateral: Secondary | ICD-10-CM | POA: Diagnosis not present

## 2018-04-21 DIAGNOSIS — E119 Type 2 diabetes mellitus without complications: Secondary | ICD-10-CM | POA: Diagnosis not present

## 2018-04-21 DIAGNOSIS — H43813 Vitreous degeneration, bilateral: Secondary | ICD-10-CM | POA: Diagnosis not present

## 2018-05-06 DIAGNOSIS — G4733 Obstructive sleep apnea (adult) (pediatric): Secondary | ICD-10-CM | POA: Diagnosis not present

## 2018-05-06 DIAGNOSIS — M654 Radial styloid tenosynovitis [de Quervain]: Secondary | ICD-10-CM | POA: Diagnosis not present

## 2018-05-06 DIAGNOSIS — M1811 Unilateral primary osteoarthritis of first carpometacarpal joint, right hand: Secondary | ICD-10-CM | POA: Diagnosis not present

## 2018-05-08 DIAGNOSIS — H2511 Age-related nuclear cataract, right eye: Secondary | ICD-10-CM | POA: Diagnosis not present

## 2018-05-08 DIAGNOSIS — H25011 Cortical age-related cataract, right eye: Secondary | ICD-10-CM | POA: Diagnosis not present

## 2018-05-08 DIAGNOSIS — H25811 Combined forms of age-related cataract, right eye: Secondary | ICD-10-CM | POA: Diagnosis not present

## 2018-05-15 DIAGNOSIS — Z23 Encounter for immunization: Secondary | ICD-10-CM | POA: Diagnosis not present

## 2018-05-22 ENCOUNTER — Inpatient Hospital Stay: Payer: Medicare Other | Attending: Hematology and Oncology

## 2018-05-22 DIAGNOSIS — Z79811 Long term (current) use of aromatase inhibitors: Secondary | ICD-10-CM | POA: Insufficient documentation

## 2018-05-22 DIAGNOSIS — Z794 Long term (current) use of insulin: Secondary | ICD-10-CM | POA: Diagnosis not present

## 2018-05-22 DIAGNOSIS — D509 Iron deficiency anemia, unspecified: Secondary | ICD-10-CM | POA: Diagnosis not present

## 2018-05-22 DIAGNOSIS — Z170 Estrogen receptor positive status [ER+]: Secondary | ICD-10-CM | POA: Diagnosis not present

## 2018-05-22 DIAGNOSIS — Z923 Personal history of irradiation: Secondary | ICD-10-CM | POA: Diagnosis not present

## 2018-05-22 DIAGNOSIS — C50412 Malignant neoplasm of upper-outer quadrant of left female breast: Secondary | ICD-10-CM | POA: Diagnosis present

## 2018-05-22 DIAGNOSIS — Z79899 Other long term (current) drug therapy: Secondary | ICD-10-CM | POA: Diagnosis not present

## 2018-05-22 LAB — IRON AND TIBC
Iron: 60 ug/dL (ref 41–142)
Saturation Ratios: 19 % — ABNORMAL LOW (ref 21–57)
TIBC: 314 ug/dL (ref 236–444)
UIBC: 254 ug/dL

## 2018-05-22 LAB — CBC WITH DIFFERENTIAL (CANCER CENTER ONLY)
Abs Immature Granulocytes: 0.07 10*3/uL (ref 0.00–0.07)
Basophils Absolute: 0 10*3/uL (ref 0.0–0.1)
Basophils Relative: 1 %
Eosinophils Absolute: 0.7 10*3/uL — ABNORMAL HIGH (ref 0.0–0.5)
Eosinophils Relative: 7 %
HCT: 41.4 % (ref 36.0–46.0)
Hemoglobin: 13.8 g/dL (ref 12.0–15.0)
Immature Granulocytes: 1 %
Lymphocytes Relative: 13 %
Lymphs Abs: 1.2 10*3/uL (ref 0.7–4.0)
MCH: 30 pg (ref 26.0–34.0)
MCHC: 33.3 g/dL (ref 30.0–36.0)
MCV: 90 fL (ref 80.0–100.0)
Monocytes Absolute: 0.5 10*3/uL (ref 0.1–1.0)
Monocytes Relative: 5 %
Neutro Abs: 6.4 10*3/uL (ref 1.7–7.7)
Neutrophils Relative %: 73 %
Platelet Count: 283 10*3/uL (ref 150–400)
RBC: 4.6 MIL/uL (ref 3.87–5.11)
RDW: 12.9 % (ref 11.5–15.5)
WBC Count: 8.8 10*3/uL (ref 4.0–10.5)
nRBC: 0 % (ref 0.0–0.2)

## 2018-05-22 LAB — FERRITIN: Ferritin: 234 ng/mL (ref 11–307)

## 2018-05-23 ENCOUNTER — Inpatient Hospital Stay: Payer: Medicare Other | Admitting: Hematology and Oncology

## 2018-05-23 ENCOUNTER — Telehealth: Payer: Self-pay | Admitting: Hematology and Oncology

## 2018-05-23 DIAGNOSIS — Z794 Long term (current) use of insulin: Secondary | ICD-10-CM

## 2018-05-23 DIAGNOSIS — Z923 Personal history of irradiation: Secondary | ICD-10-CM | POA: Diagnosis not present

## 2018-05-23 DIAGNOSIS — C50412 Malignant neoplasm of upper-outer quadrant of left female breast: Secondary | ICD-10-CM | POA: Diagnosis not present

## 2018-05-23 DIAGNOSIS — Z170 Estrogen receptor positive status [ER+]: Secondary | ICD-10-CM

## 2018-05-23 DIAGNOSIS — Z79899 Other long term (current) drug therapy: Secondary | ICD-10-CM | POA: Diagnosis not present

## 2018-05-23 DIAGNOSIS — D509 Iron deficiency anemia, unspecified: Secondary | ICD-10-CM | POA: Diagnosis not present

## 2018-05-23 DIAGNOSIS — Z79811 Long term (current) use of aromatase inhibitors: Secondary | ICD-10-CM | POA: Diagnosis not present

## 2018-05-23 MED ORDER — LANTUS SOLOSTAR 100 UNIT/ML ~~LOC~~ SOPN: 40.0000 [IU] | PEN_INJECTOR | Freq: Every day | SUBCUTANEOUS | 0 refills | Status: DC

## 2018-05-23 NOTE — Assessment & Plan Note (Signed)
IV iron therapy January 2019 Lab review: 05/22/2018: Iron saturation 19%, ferritin 234, hemoglobin 13.8 No further evidence of iron deficiency. Return to clinic in 1 year for follow-up with labs done ahead of time.

## 2018-05-23 NOTE — Telephone Encounter (Signed)
Gave patient avs and calendar.   °

## 2018-05-23 NOTE — Assessment & Plan Note (Signed)
Left breast biopsy02/19/2018: 12:30: IDC with DCIS involving underlying papillary lesion, grade 1, ER 100%, PR 90%, Ki-67 10%, HER-2 negative ratio 1.29; irregular 1.9 cm mass axilla negative, T1 CN 0 stage IA clinical stage Left lumpectomy 12/25/2016: IDC grade 1, 1.5 cm, low-grade DCIS, resection margins negative, 0/2 lymph nodes negative, ER 100%, PR 100%, HER-2 negative ratio 1.29, Ki-67 10% T1 cN0 stage IA Oncotype DX score 2: 4% risk of recurrence  Adjuvant radiation therapy 01/30/2017-02/21/2017  Treatment plan: Anastrozole 1 mg by mouth daily 5 yearsstarted 02/21/17  Anastrozole Toxicities:Complains of diffuse musculoskeletal aches and pains in the shoulders hips and knees. She attributes this to arthritis.  Surveillance: 1. Breast Exam: benign, surgical scars 2. Mammogram:  11/29/2017 3 mm group of calcifications inferior to the left breast lumpectomy site, biopsy revealed fibroadenoma.  Breast density category B

## 2018-05-23 NOTE — Progress Notes (Signed)
Patient Care Team: Shirline Frees, MD as PCP - General (Family Medicine) Delice Bison, Charlestine Massed, NP as Nurse Practitioner (Hematology and Oncology) [name], MD as Consulting Physician (Hematology and Oncology) Eppie Gibson, MD as Attending Physician (Radiation Oncology) [name], MD as Consulting Physician (General Surgery)  DIAGNOSIS:  Encounter Diagnoses  Name Primary?  . Malignant neoplasm of upper-outer quadrant of left breast in female, estrogen receptor positive ([place])   . Iron deficiency anemia, unspecified iron deficiency anemia type     SUMMARY OF ONCOLOGIC HISTORY:   Malignant neoplasm of upper-outer quadrant of left breast in female, estrogen receptor positive ([place])   10/01/2016 Initial Diagnosis    Left breast biopsy: 12:30: IDC with DCIS involving underlying papillary lesion, grade 1, ER 100%, PR 90%, Ki-67 10%, HER-2 negative ratio 1.29; irregular 1.9 cm mass axilla negative, T1 CN 0 stage IA clinical stage    12/25/2016 Surgery    Left lumpectomy: IDC grade 1, 1.5 cm, low-grade DCIS, resection margins negative, 0/2 lymph nodes negative, ER 100%, PR 100%, HER-2 negative ratio 1.29, Ki-67 10% T1 cN0 stage IA    12/25/2016 Oncotype testing    Oncotype DX score 2: Risk of recurrence 4%    01/30/2017 - 02/21/2017 Radiation Therapy    Adjuvant radiation therapy    02/2017 -  Anti-estrogen oral therapy    Anastrozole daily    03/25/2017 Genetic Testing    Genetic counseling and testing for hereditary cancer syndromes performed on 02/19/2017. Results are negative for pathogenic mutations in 46 genes analyzed by [facility] Common Hereditary Cancers Panel. Results are dated 03/25/2017. Genes tested: APC, ATM, AXIN2, BARD1, BMPR1A, BRCA1, BRCA2, BRIP1, CDH1, CDKN2A, CHEK2, CTNNA1, DICER1, EPCAM, GREM1, HOXB13, KIT, MEN1, MLH1, MSH2, MSH3, MSH6, MUTYH, NBN, NF1, NTHL1, PALB2, PDGFRA, PMS2, POLD1, POLE, PTEN, RAD50, RAD51C, RAD51D, SDHA, SDHB, SDHC, SDHD, SMAD4,  SMARCA4, STK11, TP53, TSC1, TSC2, and VHL.        CHIEF COMPLIANT: Follow-up of breast cancer and iron deficiency anemia  INTERVAL HISTORY: Susan Barnett is a 72 year old with above-mentioned history of left breast cancer treated with lumpectomy radiation is currently on anastrozole therapy.  She is tolerating treatment extremely well.  Previously she was noted to have severe iron deficiency anemia and received IV iron.  She has not required any iron supplementation.  Today her iron levels are stable and the ferritin is 234.  She is struggling with getting her blood sugars under control.  Otherwise she is doing well.  REVIEW OF SYSTEMS:   Constitutional: Denies fevers, chills or abnormal weight loss Eyes: Denies blurriness of vision Ears, nose, mouth, throat, and face: Denies mucositis or sore throat Respiratory: Denies cough, dyspnea or wheezes Cardiovascular: Denies palpitation, chest discomfort Gastrointestinal:  Denies nausea, heartburn or change in bowel habits Skin: Denies abnormal skin rashes Lymphatics: Denies new lymphadenopathy or easy bruising Neurological:Denies numbness, tingling or new weaknesses Behavioral/Psych: Mood is stable, no new changes  Extremities: No lower extremity edema Breast:  denies any pain or lumps or nodules in either breasts All other systems were reviewed with the patient and are negative.  I have reviewed the past medical history, past surgical history, social history and family history with the patient and they are unchanged from previous note.  ALLERGIES:  is allergic to asa [aspirin]; compazine [prochlorperazine edisylate]; nsaids; shellfish allergy; sulfa antibiotics; zantac [ranitidine hcl]; and lisinopril.  MEDICATIONS:  Current Outpatient Medications  Medication Sig Dispense Refill  . amLODipine (NORVASC) 5 MG tablet Take 5 mg by mouth  daily.     . anastrozole (ARIMIDEX) 1 MG tablet TAKE 1 TABLET BY MOUTH ONCE DAILY 90 tablet 3  .  atorvastatin (LIPITOR) 10 MG tablet Take 10 mg by mouth every evening.     . cloNIDine (CATAPRES) 0.1 MG tablet Take 0.1 mg by mouth every evening.     . clopidogrel (PLAVIX) 75 MG tablet Take 75 mg by mouth daily.    . Coenzyme Q10 300 MG CAPS Take 300 mg by mouth 2 (two) times daily.    Marland Kitchen LANTUS SOLOSTAR 100 UNIT/ML Solostar Pen Inject 32 Units into the skin at bedtime.   0  . levothyroxine (SYNTHROID, LEVOTHROID) 75 MCG tablet Take 75 mcg by mouth daily before breakfast.     . loratadine (CLARITIN) 10 MG tablet Take 1 tablet (10 mg total) by mouth daily.    Marland Kitchen losartan-hydrochlorothiazide (HYZAAR) 100-25 MG tablet TAKE 1 TABLET BY MOUTH ONCE DAILY 90 tablet 3  . metoprolol (LOPRESSOR) 50 MG tablet Take 1 tablet (50 mg total) by mouth 2 (two) times daily. 180 tablet 3  . NITROSTAT 0.4 MG SL tablet Place 1 tablet (0.4 mg total) under the tongue every 5 (five) minutes as needed for chest pain. 25 tablet 3  . pantoprazole (PROTONIX) 40 MG tablet Take 1 tablet (40 mg total) by mouth daily before breakfast. 90 tablet 3  . potassium chloride SA (K-DUR,KLOR-CON) 20 MEQ tablet Take 20 mEq by mouth daily.  2  . sitaGLIPtin-metformin (JANUMET) 50-1000 MG tablet Take 1 tablet by mouth 2 (two) times daily with a meal.     Current Facility-Administered Medications  Medication Dose Route Frequency Provider Last Rate Last Dose  . 0.9 %  sodium chloride infusion  500 mL Intravenous Continuous Irene Shipper, MD        PHYSICAL EXAMINATION: ECOG PERFORMANCE STATUS: 1 - Symptomatic but completely ambulatory  Vitals:   05/23/18 1003  BP: (!) 139/57  Pulse: 68  Resp: 18  Temp: 97.9 F (36.6 C)  SpO2: 100%   Filed Weights   05/23/18 1003  Weight: 165 lb 1.6 oz (74.9 kg)    GENERAL:alert, no distress and comfortable SKIN: skin color, texture, turgor are normal, no rashes or significant lesions EYES: normal, Conjunctiva are pink and non-injected, sclera clear OROPHARYNX:no exudate, no erythema and  lips, buccal mucosa, and tongue normal  NECK: supple, thyroid normal size, non-tender, without nodularity LYMPH:  no palpable lymphadenopathy in the cervical, axillary or inguinal LUNGS: clear to auscultation and percussion with normal breathing effort HEART: regular rate & rhythm and no murmurs and no lower extremity edema ABDOMEN:abdomen soft, non-tender and normal bowel sounds MUSCULOSKELETAL:no cyanosis of digits and no clubbing  NEURO: alert & oriented x 3 with fluent speech, no focal motor/sensory deficits EXTREMITIES: No lower extremity edema BREAST: No palpable masses or nodules in either right or left breasts. No palpable axillary supraclavicular or infraclavicular adenopathy no breast tenderness or nipple discharge. (exam performed in the presence of a chaperone)  LABORATORY DATA:  I have reviewed the data as listed CMP Latest Ref Rng & Units 06/11/2017 05/16/2017 05/07/2017  Glucose 65 - 99 mg/dL 169(H) 164(H) 158(H)  BUN 8 - 27 mg/dL _0 Creatinine 0.57 - 1.00 mg/dL 0.67 0.72 0.71  Sodium 134 - 144 mmol/L 133(L) 133(L) 129(L)  Potassium 3.5 - 5.2 mmol/L 4.2 4.1 3.9  Chloride 96 - 106 mmol/L 92(L) 94(L) 91(L)  CO2 20 - 29 mmol/L _1 Calcium 8.7 - 10.3  mg/dL 9.8 9.7 9.2  Total Protein 6.5 - 8.1 g/dL - - -  Total Bilirubin 0.3 - 1.2 mg/dL - - -  Alkaline Phos 38 - 126 U/L - - -  AST 15 - 41 U/L - - -  ALT 14 - 54 U/L - - -    Lab Results  Component Value Date   WBC 8.8 05/22/2018   HGB 13.8 05/22/2018   HCT 41.4 05/22/2018   MCV 90.0 05/22/2018   PLT 283 05/22/2018   NEUTROABS 6.4 05/22/2018    ASSESSMENT & PLAN:  Malignant neoplasm of upper-outer quadrant of left breast in female, estrogen receptor positive ([facility]) Left breast biopsy02/19/2018: 12:30: IDC with DCIS involving underlying papillary lesion, grade 1, ER 100%, PR 90%, Ki-67 10%, HER-2 negative ratio 1.29; irregular 1.9 cm mass axilla negative, T1 CN 0 stage IA clinical stage Left lumpectomy  12/25/2016: IDC grade 1, 1.5 cm, low-grade DCIS, resection margins negative, 0/2 lymph nodes negative, ER 100%, PR 100%, HER-2 negative ratio 1.29, Ki-67 10% T1 cN0 stage IA Oncotype DX score 2: 4% risk of recurrence  Adjuvant radiation therapy 01/30/2017-02/21/2017  Treatment plan: Anastrozole 1 mg by mouth daily 5 yearsstarted 02/21/17  Anastrozole Toxicities:Complains of diffuse musculoskeletal aches and pains in the shoulders hips and knees. She attributes this to arthritis.  Surveillance: 1. Breast Exam: benign, surgical scars 2. Mammogram:  11/29/2017 3 mm group of calcifications inferior to the left breast lumpectomy site, biopsy revealed fibroadenoma.  Breast density category B   Iron deficiency anemia IV iron therapy January 2019 Lab review: 05/22/2018: Iron saturation 19%, ferritin 234, hemoglobin 13.8 No further evidence of iron deficiency. Return to clinic in 1 year for follow-up with labs done ahead of time.      No orders of the defined types were placed in this encounter.  The patient has a good understanding of the overall plan. she agrees with it. she will call with any problems that may develop before the next visit here.   Viinay K Gudena, MD 05/23/18    

## 2018-06-12 DIAGNOSIS — H2512 Age-related nuclear cataract, left eye: Secondary | ICD-10-CM | POA: Diagnosis not present

## 2018-06-12 DIAGNOSIS — H25012 Cortical age-related cataract, left eye: Secondary | ICD-10-CM | POA: Diagnosis not present

## 2018-06-12 DIAGNOSIS — H25812 Combined forms of age-related cataract, left eye: Secondary | ICD-10-CM | POA: Diagnosis not present

## 2018-06-16 DIAGNOSIS — C50919 Malignant neoplasm of unspecified site of unspecified female breast: Secondary | ICD-10-CM | POA: Diagnosis not present

## 2018-06-16 DIAGNOSIS — E1129 Type 2 diabetes mellitus with other diabetic kidney complication: Secondary | ICD-10-CM | POA: Diagnosis not present

## 2018-06-16 DIAGNOSIS — N08 Glomerular disorders in diseases classified elsewhere: Secondary | ICD-10-CM | POA: Diagnosis not present

## 2018-06-16 DIAGNOSIS — D126 Benign neoplasm of colon, unspecified: Secondary | ICD-10-CM | POA: Diagnosis not present

## 2018-06-17 ENCOUNTER — Other Ambulatory Visit: Payer: Self-pay | Admitting: Endocrinology

## 2018-06-17 DIAGNOSIS — E049 Nontoxic goiter, unspecified: Secondary | ICD-10-CM

## 2018-06-17 DIAGNOSIS — E1165 Type 2 diabetes mellitus with hyperglycemia: Secondary | ICD-10-CM | POA: Diagnosis not present

## 2018-06-17 DIAGNOSIS — E215 Disorder of parathyroid gland, unspecified: Secondary | ICD-10-CM

## 2018-06-19 ENCOUNTER — Ambulatory Visit
Admission: RE | Admit: 2018-06-19 | Discharge: 2018-06-19 | Disposition: A | Discharge status: Home / Self Care | Payer: Medicare Other | Source: Ambulatory Visit | Attending: Endocrinology | Admitting: Endocrinology

## 2018-06-19 DIAGNOSIS — E215 Disorder of parathyroid gland, unspecified: Secondary | ICD-10-CM

## 2018-06-19 DIAGNOSIS — E049 Nontoxic goiter, unspecified: Secondary | ICD-10-CM | POA: Diagnosis not present

## 2018-06-24 ENCOUNTER — Other Ambulatory Visit: Payer: Self-pay | Admitting: Endocrinology

## 2018-06-24 ENCOUNTER — Other Ambulatory Visit: Payer: Self-pay | Admitting: Interventional Cardiology

## 2018-06-24 DIAGNOSIS — E049 Nontoxic goiter, unspecified: Secondary | ICD-10-CM

## 2018-07-17 ENCOUNTER — Ambulatory Visit
Admission: RE | Admit: 2018-07-17 | Discharge: 2018-07-17 | Disposition: A | Discharge status: Home / Self Care | Payer: Medicare Other | Source: Ambulatory Visit | Attending: Endocrinology | Admitting: Endocrinology

## 2018-07-17 ENCOUNTER — Other Ambulatory Visit (HOSPITAL_COMMUNITY)
Admission: RE | Admit: 2018-07-17 | Discharge: 2018-07-17 | Disposition: A | Discharge status: Home / Self Care | Payer: Medicare Other | Source: Ambulatory Visit | Attending: Radiology | Admitting: Radiology

## 2018-07-17 DIAGNOSIS — E049 Nontoxic goiter, unspecified: Secondary | ICD-10-CM | POA: Insufficient documentation

## 2018-07-17 DIAGNOSIS — E041 Nontoxic single thyroid nodule: Secondary | ICD-10-CM | POA: Diagnosis not present

## 2018-07-22 ENCOUNTER — Other Ambulatory Visit: Payer: Self-pay | Admitting: *Deleted

## 2018-07-22 MED ORDER — HYDROCHLOROTHIAZIDE 25 MG PO TABS: 25.0000 mg | ORAL_TABLET | Freq: Every day | ORAL | 3 refills | Status: DC

## 2018-07-22 MED ORDER — LOSARTAN POTASSIUM 100 MG PO TABS: 100.0000 mg | ORAL_TABLET | Freq: Every day | ORAL | 3 refills | Status: DC

## 2018-08-21 NOTE — Progress Notes (Signed)
Cardiology Office Note   Date:  08/22/2018   ID:  Susan Barnett, Susan Barnett 1946/06/03, MRN [id number]  PCP:  Shirline Frees, MD  Cardiologist:  Dr. Tamala Julian    Chief Complaint  Patient presents with  . Chest Pain    dyspnea      History of Present Illness: Susan Barnett is a 73 y.o. female who presents for new chest pressure, tightness,heaviness and fluttering into throat.   Occurs when waking rapidly to   She has a hx of  CAD,  CABG for repeat in-stent restenosis of right coronary 2015, hypertension, diabetes mellitus, hyperlipidemia, and obstructive sleep apnea.  Coronary artery bypass grafting x 2  SVG to diagonal,  SVG to PDA  2015  Today she is here for chest pressure and tightness.  As above.  Some dyspnea with the pressure.  Also DOE more recently.  Her glucose is stable.  Her BP has been elevated as well.  But since she called the tightness has improved.  BP has been up to 476 systolic at home and is higher today.  She has had some lower ext edema as well. Her NTG is out of date. No nausea or diaphoresis with symptoms.    Past Medical History:  Diagnosis Date  . Anemia    mild  . Anginal pain ([place])    with exertion, last time prior to cardiac caht 06/02/14  . Anxiety    situational  . Arthritis   . CAD (coronary artery disease)    Dr. Linard Millers follows, no problems now  . Cancer [facility])    Breast cancer  . Complication of anesthesia   . Diabetes mellitus, type 2 ([place])    TYPE 2  . Dysrhythmia    occ skips a beat, rate was fast before beta blocker.  . Gastric polyp   . Genetic testing 04/12/2017   Ms. Brindisi underwent genetic counseling and testing for hereditary cancer syndromes on 02/19/2017. Her results were negative for mutations in all 46 genes analyzed by [facility]'s 46-gene Common Hereditary Cancers Panel. Genes analyzed include: APC, ATM, AXIN2, BARD1, BMPR1A, BRCA1, BRCA2, BRIP1, CDH1, CDKN2A, CHEK2, CTNNA1, DICER1, EPCAM, GREM1, HOXB13, KIT, MEN1, MLH1, MSH2,  MSH3, MSH6, MUTYH, NBN  . GERD (gastroesophageal reflux disease)   . Headache    hx of  . Heartburn   . Hiatal hernia    mild head tremors  . History of radiation therapy 01/30/17- 02/21/17   Left Breast 42.56 Gy in 16 fractions  . Hypertension   . Hypothyroidism   . Occasional tremors    "of head", slight hands  . Osteopenia   . Personal history of radiation therapy   . Pneumonia   . PONV (postoperative nausea and vomiting) 02/24/14  . Shortness of breath    with exertion  . Sleep apnea    cpap used nightly x 2 yrs now.    Past Surgical History:  Procedure Laterality Date  . BREAST BIOPSY Left 1987  . BREAST LUMPECTOMY Left   . BREAST LUMPECTOMY WITH RADIOACTIVE SEED AND SENTINEL LYMPH NODE BIOPSY Left 12/25/2016   Procedure: BREAST LUMPECTOMY WITH RADIOACTIVE SEED AND SENTINEL LYMPH NODE BIOPSY;  Surgeon: [name], MD;  Location: [facility];  Service: General;  Laterality: Left;  . CARDIAC CATHETERIZATION  06/02/2014   LAD 30%, D1 80%, CFX 755, RCA > 95% ISR, rx w/ PTCA, heavy calcification, EF 65%  . CORONARY ARTERY BYPASS GRAFT N/A 06/14/2014   Procedure: CORONARY ARTERY BYPASS GRAFTING (  CABG);  Surgeon: Gaye Pollack, MD;  Location: [facility] OR;  Service: Open Heart Surgery;  Laterality: N/A;  Times 2 using endoscopically harvested right saphenous vein.  . coronary stents     x3 stents placed-prior ablation  . DILATION AND CURETTAGE OF UTERUS    . ELBOW FRACTURE SURGERY Left   . INCONTINENCE SURGERY     sling  . KNEE ARTHROSCOPY Right 02/24/2014   Procedure: RIGHT KNEE ARTHROSCOPY WITH DEBRIDEMENT ;  Surgeon: Gearlean Alf, MD;  Location: [facility] ORS;  Service: Orthopedics;  Laterality: Right;  . LEFT HEART CATHETERIZATION WITH CORONARY ANGIOGRAM N/A 06/02/2014   Procedure: LEFT HEART CATHETERIZATION WITH CORONARY ANGIOGRAM;  Surgeon: Sinclair Grooms, MD;  Location: [facility] CATH LAB;  Service: Cardiovascular;  Laterality: N/A;  . TEE WITHOUT CARDIOVERSION N/A 06/14/2014    Procedure: TRANSESOPHAGEAL ECHOCARDIOGRAM (TEE);  Surgeon: Gaye Pollack, MD;  Location: [facility];  Service: Open Heart Surgery;  Laterality: N/A;     Current Outpatient Medications  Medication Sig Dispense Refill  . amLODipine (NORVASC) 5 MG tablet Take 5 mg by mouth daily.     Marland Kitchen anastrozole (ARIMIDEX) 1 MG tablet TAKE 1 TABLET BY MOUTH ONCE DAILY 90 tablet 3  . atorvastatin (LIPITOR) 10 MG tablet Take 10 mg by mouth every evening.     . cloNIDine (CATAPRES) 0.1 MG tablet Take 1 tablet (0.1 mg total) by mouth 2 (two) times daily. 60 tablet 6  . clopidogrel (PLAVIX) 75 MG tablet Take 75 mg by mouth daily.    . Coenzyme Q10 300 MG CAPS Take 300 mg by mouth 2 (two) times daily.    . diphenhydramine-acetaminophen (TYLENOL PM) 25-500 MG TABS tablet Take 1 tablet by mouth at bedtime as needed.    . hydrochlorothiazide (HYDRODIURIL) 25 MG tablet Take 1 tablet (25 mg total) by mouth daily. 90 tablet 3  . LANTUS SOLOSTAR 100 UNIT/ML Solostar Pen Inject 40 Units into the skin at bedtime. 15 mL 0  . levothyroxine (SYNTHROID, LEVOTHROID) 75 MCG tablet Take 75 mcg by mouth daily before breakfast.     . loratadine (CLARITIN) 10 MG tablet Take 1 tablet (10 mg total) by mouth daily.    Marland Kitchen losartan (COZAAR) 100 MG tablet Take 1 tablet (100 mg total) by mouth daily. 90 tablet 3  . metoprolol (LOPRESSOR) 50 MG tablet Take 1 tablet (50 mg total) by mouth 2 (two) times daily. 180 tablet 3  . NITROSTAT 0.4 MG SL tablet Place 1 tablet (0.4 mg total) under the tongue every 5 (five) minutes as needed for chest pain. 25 tablet 3  . pantoprazole (PROTONIX) 40 MG tablet Take 1 tablet (40 mg total) by mouth daily before breakfast. 90 tablet 3  . potassium chloride SA (K-DUR,KLOR-CON) 20 MEQ tablet Take 20 mEq by mouth daily.  2  . sitaGLIPtin-metformin (JANUMET) 50-1000 MG tablet Take 1 tablet by mouth 2 (two) times daily with a meal.     Current Facility-Administered Medications  Medication Dose Route Frequency Provider  Last Rate Last Dose  . 0.9 %  sodium chloride infusion  500 mL Intravenous Continuous Irene Shipper, MD        Allergies:   Asa [aspirin]; Compazine [prochlorperazine edisylate]; Nsaids; Shellfish allergy; Sulfa antibiotics; Zantac [ranitidine hcl]; and Lisinopril    Social History:  The patient  reports that she has never smoked. She has never used smokeless tobacco. She reports that she does not drink alcohol or use drugs.   Family History:  The patient's family history includes Breast cancer (age of onset: 22) in her cousin; Diabetes in her mother; Healthy in her brother; Heart attack in her mother; Hodgkin's lymphoma in her maternal uncle; Hypertension in her mother; Lung cancer in her brother; Ulcerative colitis in her daughter.    ROS:  General:no colds or fevers, no weight changes Skin:no rashes or ulcers HEENT:no blurred vision, no congestion CV:see HPI PUL:see HPI GI:no diarrhea constipation or melena, no indigestion GU:no hematuria, no dysuria MS:no joint pain, no claudication Neuro:no syncope, no lightheadedness Endo:+ diabetes, no thyroid disease  Wt Readings from Last 3 Encounters:  08/22/18 166 lb 6.4 oz (75.5 kg)  05/23/18 165 lb 1.6 oz (74.9 kg)  11/20/17 163 lb 8 oz (74.2 kg)     PHYSICAL EXAM: VS:  BP (!) 152/74   Pulse 76   Ht 5' 2"  (1.575 m)   Wt 166 lb 6.4 oz (75.5 kg)   SpO2 99%   BMI 30.43 kg/m  , BMI Body mass index is 30.43 kg/m. General:Pleasant affect, NAD Skin:Warm and dry, brisk capillary refill HEENT:normocephalic, sclera clear, mucus membranes moist Neck:supple, no JVD, no bruits  Heart:S1S2 RRR without murmur, gallup, rub or click Lungs:clear without rales, rhonchi, or wheezes QQI:WLNL, non tender, + BS, do not palpate liver spleen or masses Ext:no lower ext edema, 2+ pedal pulses, 2+ radial pulses Neuro:alert and oriented X 3, MAE, follows commands, + facial symmetry    EKG:  EKG is ordered today. The ekg ordered today demonstrates  SR normal EKG no changes.    Recent Labs: 05/22/2018: Hemoglobin 13.8; Platelet Count 283    Lipid Panel    Component Value Date/Time   CHOL  08/13/2007 0615    116        ATP III CLASSIFICATION:  <200     mg/dL   Desirable  200-239  mg/dL   Borderline High  >=240    mg/dL   High   TRIG 69 08/13/2007 0615   HDL 41 08/13/2007 0615   CHOLHDL 2.8 08/13/2007 0615   VLDL 14 08/13/2007 0615   LDLCALC  08/13/2007 0615    61        Total Cholesterol/HDL:CHD Risk Coronary Heart Disease Risk Table                     Men   Women  1/2 Average Risk   3.4   3.3       Other studies Reviewed: Additional studies/ records that were reviewed today include:  See above CABG 06/2014 1. Median Sternotomy 2. Extracorporeal circulation 3.   Coronary artery bypass grafting x 2   SVG to diagonal  SVG to PDA  4.   Endoscopic vein harvest from the right leg   ASSESSMENT AND PLAN:  1.  Angina with exertion.  Will check lesixcan myoview - she has knee issues and does not believe she can walk on treadmill.  She did have radiation with her breast cancer - she will follow up with Dr. Tamala Julian. Check BMP and CBC. Continue BB, and ARB.   2.  DOE may be graft dysfunction, will check echo.  Also with increase of lower ext edema.  3.  HTN increased will increase clonidine to BID   4.  CAD with hx of CABG in 2015.  5.  DM-2 on insulin followed by PCP   6.  Hx of breast cancer.  Treated with radiation.  7.  HLD on lipitor.  Continue LDL  was 71 in June, well controlled.  8.  OSA with CPAP followed by Dr. Radford Pax.  9.  Hx of iron def anemia will check CBC as cause of symptoms   Current medicines are reviewed with the patient today.  The patient Has no concerns regarding medicines.  The following changes have been made:  See above Labs/ tests ordered today include:see above  Disposition:   FU:  see above  Signed, Cecilie Kicks, NP  08/22/2018 2:32 PM    [facility] [place], [place], [place], [place] Phone: [phone]; Fax: [phone]

## 2018-08-22 ENCOUNTER — Encounter: Payer: Self-pay | Admitting: Cardiology

## 2018-08-22 ENCOUNTER — Encounter: Payer: Self-pay | Admitting: *Deleted

## 2018-08-22 ENCOUNTER — Ambulatory Visit: Payer: Medicare Other | Admitting: Cardiology

## 2018-08-22 VITALS — BP 152/74 | HR 76 | Ht 62.0 in | Wt 166.4 lb

## 2018-08-22 DIAGNOSIS — R0609 Other forms of dyspnea: Secondary | ICD-10-CM

## 2018-08-22 DIAGNOSIS — E118 Type 2 diabetes mellitus with unspecified complications: Secondary | ICD-10-CM

## 2018-08-22 DIAGNOSIS — Z951 Presence of aortocoronary bypass graft: Secondary | ICD-10-CM | POA: Diagnosis not present

## 2018-08-22 DIAGNOSIS — Z794 Long term (current) use of insulin: Secondary | ICD-10-CM

## 2018-08-22 DIAGNOSIS — Z853 Personal history of malignant neoplasm of breast: Secondary | ICD-10-CM

## 2018-08-22 DIAGNOSIS — E7849 Other hyperlipidemia: Secondary | ICD-10-CM

## 2018-08-22 DIAGNOSIS — R0600 Dyspnea, unspecified: Secondary | ICD-10-CM

## 2018-08-22 DIAGNOSIS — R0602 Shortness of breath: Secondary | ICD-10-CM

## 2018-08-22 DIAGNOSIS — D649 Anemia, unspecified: Secondary | ICD-10-CM | POA: Diagnosis not present

## 2018-08-22 DIAGNOSIS — I10 Essential (primary) hypertension: Secondary | ICD-10-CM

## 2018-08-22 DIAGNOSIS — I2510 Atherosclerotic heart disease of native coronary artery without angina pectoris: Secondary | ICD-10-CM | POA: Diagnosis not present

## 2018-08-22 DIAGNOSIS — G4733 Obstructive sleep apnea (adult) (pediatric): Secondary | ICD-10-CM

## 2018-08-22 MED ORDER — NITROSTAT 0.4 MG SL SUBL: 0.4000 mg | SUBLINGUAL_TABLET | SUBLINGUAL | 3 refills | Status: DC | PRN

## 2018-08-22 MED ORDER — CLONIDINE HCL 0.1 MG PO TABS: 0.1000 mg | ORAL_TABLET | Freq: Two times a day (BID) | ORAL | 6 refills | Status: DC

## 2018-08-22 NOTE — Patient Instructions (Signed)
Your physician has recommended you make the following change in your medication:  INCREASE CLONIDINE TO TWICE DAILY   Your physician recommends that you return for lab work in:  [place] has requested that you have a lexiscan myoview. For further information please visit [email or web address]. Please follow instruction sheet, as given.  Your physician recommends that you schedule a follow-up appointment in:  2-3 [place]

## 2018-08-23 LAB — CBC WITH DIFFERENTIAL/PLATELET
Basophils Absolute: 0.1 10*3/uL (ref 0.0–0.2)
Basos: 1 %
EOS (ABSOLUTE): 0.9 10*3/uL — ABNORMAL HIGH (ref 0.0–0.4)
Eos: 8 %
Hematocrit: 41.8 % (ref 34.0–46.6)
Hemoglobin: 14.1 g/dL (ref 11.1–15.9)
Immature Grans (Abs): 0.1 10*3/uL (ref 0.0–0.1)
Immature Granulocytes: 1 %
Lymphocytes Absolute: 1.5 10*3/uL (ref 0.7–3.1)
Lymphs: 14 %
MCH: 30.4 pg (ref 26.6–33.0)
MCHC: 33.7 g/dL (ref 31.5–35.7)
MCV: 90 fL (ref 79–97)
Monocytes Absolute: 0.6 10*3/uL (ref 0.1–0.9)
Monocytes: 5 %
Neutrophils Absolute: 7.8 10*3/uL — ABNORMAL HIGH (ref 1.4–7.0)
Neutrophils: 71 %
Platelets: 351 10*3/uL (ref 150–450)
RBC: 4.64 x10E6/uL (ref 3.77–5.28)
RDW: 12.5 % (ref 11.7–15.4)
WBC: 10.9 10*3/uL — ABNORMAL HIGH (ref 3.4–10.8)

## 2018-08-23 LAB — BASIC METABOLIC PANEL
BUN/Creatinine Ratio: 17 (ref 12–28)
BUN: 11 mg/dL (ref 8–27)
CO2: 23 mmol/L (ref 20–29)
Calcium: 10 mg/dL (ref 8.7–10.3)
Chloride: 89 mmol/L — ABNORMAL LOW (ref 96–106)
Creatinine, Ser: 0.66 mg/dL (ref 0.57–1.00)
GFR calc Af Amer: 101 mL/min/{1.73_m2} (ref >59)
GFR calc non Af Amer: 88 mL/min/{1.73_m2} (ref >59)
Glucose: 284 mg/dL — ABNORMAL HIGH (ref 65–99)
Potassium: 4.1 mmol/L (ref 3.5–5.2)
Sodium: 132 mmol/L — ABNORMAL LOW (ref 134–144)

## 2018-08-25 NOTE — Addendum Note (Signed)
Addended by: Gaetano Net on: 08/25/2018 07:07 AM   Modules accepted: Orders

## 2018-08-28 ENCOUNTER — Telehealth (HOSPITAL_COMMUNITY): Payer: Self-pay | Admitting: *Deleted

## 2018-08-28 ENCOUNTER — Telehealth (HOSPITAL_COMMUNITY): Payer: Self-pay

## 2018-08-28 NOTE — Telephone Encounter (Signed)
Left message on voicemail in reference to upcoming appointment scheduled for 09/01/18 Phone number given for a call back so details instructions can be given. Kirstie Peri

## 2018-08-28 NOTE — Telephone Encounter (Signed)
Pt called in for her instructions for her stress test. Detailed instructions given and the pt understood and stated that she would be here for her test. S.Mable Dara EMTP

## 2018-08-29 ENCOUNTER — Telehealth: Payer: Self-pay

## 2018-08-29 ENCOUNTER — Other Ambulatory Visit: Payer: Self-pay

## 2018-08-29 MED ORDER — NITROGLYCERIN 0.4 MG SL SUBL: 0.4000 mg | SUBLINGUAL_TABLET | SUBLINGUAL | 3 refills | Status: DC | PRN

## 2018-08-29 NOTE — Telephone Encounter (Signed)
**Note De-Identified Susan Barnett Obfuscation** We received a PA request from [facility] for brand name Nitrostat.  I called the pt who states that to her knowledge she has never taken the generic Nitroglycerin and states that we can refill it as generic Nitroglycerin and that if she has any problems with it she will call to let us know.  I have sent her NTG 0.4 mg #25 with 3 refills to [facility] to fill.

## 2018-09-01 ENCOUNTER — Ambulatory Visit (HOSPITAL_COMMUNITY): Payer: Medicare Other | Attending: Cardiology

## 2018-09-01 ENCOUNTER — Ambulatory Visit (HOSPITAL_BASED_OUTPATIENT_CLINIC_OR_DEPARTMENT_OTHER): Payer: Medicare Other

## 2018-09-01 ENCOUNTER — Other Ambulatory Visit: Payer: Self-pay

## 2018-09-01 VITALS — Ht 62.0 in | Wt 166.0 lb

## 2018-09-01 DIAGNOSIS — R0602 Shortness of breath: Secondary | ICD-10-CM

## 2018-09-01 DIAGNOSIS — R0609 Other forms of dyspnea: Secondary | ICD-10-CM

## 2018-09-01 DIAGNOSIS — D649 Anemia, unspecified: Secondary | ICD-10-CM | POA: Insufficient documentation

## 2018-09-01 DIAGNOSIS — R0600 Dyspnea, unspecified: Secondary | ICD-10-CM

## 2018-09-01 DIAGNOSIS — I25709 Atherosclerosis of coronary artery bypass graft(s), unspecified, with unspecified angina pectoris: Secondary | ICD-10-CM | POA: Insufficient documentation

## 2018-09-01 DIAGNOSIS — Z951 Presence of aortocoronary bypass graft: Secondary | ICD-10-CM | POA: Diagnosis not present

## 2018-09-01 LAB — MYOCARDIAL PERFUSION IMAGING
LV dias vol: 54 mL (ref 46–106)
LV sys vol: 18 mL
Peak HR: 83 {beats}/min
Rest HR: 63 {beats}/min
SDS: 0
SRS: 0
SSS: 0
TID: 1.05

## 2018-09-01 LAB — ECHOCARDIOGRAM COMPLETE
Height: 62 in
Weight: 2656 oz

## 2018-09-01 MED ORDER — TECHNETIUM TC 99M TETROFOSMIN IV KIT
10.3000 | PACK | Freq: Once | INTRAVENOUS | Status: AC | PRN
  Administered 2018-09-01: 10.3 via INTRAVENOUS
  Filled 2018-09-01: qty 11

## 2018-09-01 MED ORDER — REGADENOSON 0.4 MG/5ML IV SOLN
0.4000 mg | Freq: Once | INTRAVENOUS | Status: AC
  Administered 2018-09-01: 0.4 mg via INTRAVENOUS

## 2018-09-01 MED ORDER — TECHNETIUM TC 99M TETROFOSMIN IV KIT
32.5000 | PACK | Freq: Once | INTRAVENOUS | Status: AC | PRN
  Administered 2018-09-01: 32.5 via INTRAVENOUS
  Filled 2018-09-01: qty 33

## 2018-09-17 NOTE — Progress Notes (Signed)
Cardiology Office Note:    Date:  09/18/2018   ID:  Susan Barnett, Susan Barnett 10/28/1945, MRN [id number]  PCP:  Shirline Frees, MD  Cardiologist:  Sinclair Grooms, MD   Referring MD: Shirline Frees, MD   Chief Complaint  Patient presents with  . Coronary Artery Disease  . Chest Pain    History of Present Illness:    Susan Barnett is a 73 y.o. female with a hx of for CAD, recent CABG for repeat in-stent restenosis of right coronary, hypertension, diabetes mellitus, hyperlipidemia, and obstructive sleep apnea.   Recent recurrent chest pain and dyspnea.  The chest discomfort became an issue when the weather turned cold.  She would notice with ambulation that there would be tightness in the chest with radiation into the neck.  Rest would relieve the discomfort.  She had up to 5 episodes of discomfort.  There was mild dyspnea on exertion.  She came in to see a team member and work-up included an echo and nuclear study.  Both were unremarkable.  She has not had an episode of chest pain since several days prior to the office visit which led to the work-up.  Nitroglycerin causes severe headache, therefore she did not use nitroglycerin when symptoms were occurring.   Past Medical History:  Diagnosis Date  . Anemia    mild  . Anginal pain ([place])    with exertion, last time prior to cardiac caht 06/02/14  . Anxiety    situational  . Arthritis   . CAD (coronary artery disease)    Dr. Linard Millers follows, no problems now  . Cancer [facility])    Breast cancer  . Complication of anesthesia   . Diabetes mellitus, type 2 ([place])    TYPE 2  . Dysrhythmia    occ skips a beat, rate was fast before beta blocker.  . Gastric polyp   . Genetic testing 04/12/2017   Susan Barnett underwent genetic counseling and testing for hereditary cancer syndromes on 02/19/2017. Her results were negative for mutations in all 46 genes analyzed by [facility]'s 46-gene Common Hereditary Cancers Panel. Genes analyzed include: APC,  ATM, AXIN2, BARD1, BMPR1A, BRCA1, BRCA2, BRIP1, CDH1, CDKN2A, CHEK2, CTNNA1, DICER1, EPCAM, GREM1, HOXB13, KIT, MEN1, MLH1, MSH2, MSH3, MSH6, MUTYH, NBN  . GERD (gastroesophageal reflux disease)   . Headache    hx of  . Heartburn   . Hiatal hernia    mild head tremors  . History of radiation therapy 01/30/17- 02/21/17   Left Breast 42.56 Gy in 16 fractions  . Hypertension   . Hypothyroidism   . Occasional tremors    "of head", slight hands  . Osteopenia   . Personal history of radiation therapy   . Pneumonia   . PONV (postoperative nausea and vomiting) 02/24/14  . Shortness of breath    with exertion  . Sleep apnea    cpap used nightly x 2 yrs now.    Past Surgical History:  Procedure Laterality Date  . BREAST BIOPSY Left 1987  . BREAST LUMPECTOMY Left   . BREAST LUMPECTOMY WITH RADIOACTIVE SEED AND SENTINEL LYMPH NODE BIOPSY Left 12/25/2016   Procedure: BREAST LUMPECTOMY WITH RADIOACTIVE SEED AND SENTINEL LYMPH NODE BIOPSY;  Surgeon: [name], MD;  Location: [place];  Service: General;  Laterality: Left;  . CARDIAC CATHETERIZATION  06/02/2014   LAD 30%, D1 80%, CFX 755, RCA > 95% ISR, rx w/ PTCA, heavy calcification, EF 65%  . CORONARY ARTERY BYPASS GRAFT  N/A 06/14/2014   Procedure: CORONARY ARTERY BYPASS GRAFTING (CABG);  Surgeon: Gaye Pollack, MD;  Location: [place];  Service: Open Heart Surgery;  Laterality: N/A;  Times 2 using endoscopically harvested right saphenous vein.  . coronary stents     x3 stents placed-prior ablation  . DILATION AND CURETTAGE OF UTERUS    . ELBOW FRACTURE SURGERY Left   . INCONTINENCE SURGERY     sling  . KNEE ARTHROSCOPY Right 02/24/2014   Procedure: RIGHT KNEE ARTHROSCOPY WITH DEBRIDEMENT ;  Surgeon: Gearlean Alf, MD;  Location: [facility];  Service: Orthopedics;  Laterality: Right;  . LEFT HEART CATHETERIZATION WITH CORONARY ANGIOGRAM N/A 06/02/2014   Procedure: LEFT HEART CATHETERIZATION WITH CORONARY ANGIOGRAM;  Surgeon: Sinclair Grooms, MD;  Location: [facility] CATH LAB;  Service: Cardiovascular;  Laterality: N/A;  . TEE WITHOUT CARDIOVERSION N/A 06/14/2014   Procedure: TRANSESOPHAGEAL ECHOCARDIOGRAM (TEE);  Surgeon: Gaye Pollack, MD;  Location: [place];  Service: Open Heart Surgery;  Laterality: N/A;    Current Medications: Current Meds  Medication Sig  . amLODipine (NORVASC) 5 MG tablet Take 5 mg by mouth daily.   Marland Kitchen anastrozole (ARIMIDEX) 1 MG tablet TAKE 1 TABLET BY MOUTH ONCE DAILY  . atorvastatin (LIPITOR) 10 MG tablet Take 10 mg by mouth every evening.   . cloNIDine (CATAPRES) 0.1 MG tablet Take 1 tablet (0.1 mg total) by mouth 2 (two) times daily.  . clopidogrel (PLAVIX) 75 MG tablet Take 75 mg by mouth daily.  . Coenzyme Q10 300 MG CAPS Take 300 mg by mouth 2 (two) times daily.  . diphenhydramine-acetaminophen (TYLENOL PM) 25-500 MG TABS tablet Take 1 tablet by mouth at bedtime as needed.  . hydrochlorothiazide (HYDRODIURIL) 25 MG tablet Take 1 tablet (25 mg total) by mouth daily.  Marland Kitchen LANTUS SOLOSTAR 100 UNIT/ML Solostar Pen Inject 40 Units into the skin at bedtime.  Marland Kitchen levothyroxine (SYNTHROID, LEVOTHROID) 75 MCG tablet Take 75 mcg by mouth daily before breakfast.   . loratadine (CLARITIN) 10 MG tablet Take 1 tablet (10 mg total) by mouth daily.  Marland Kitchen losartan (COZAAR) 100 MG tablet Take 1 tablet (100 mg total) by mouth daily.  . metoprolol (LOPRESSOR) 50 MG tablet Take 1 tablet (50 mg total) by mouth 2 (two) times daily.  . nitroGLYCERIN (NITROSTAT) 0.4 MG SL tablet Place 1 tablet (0.4 mg total) under the tongue every 5 (five) minutes as needed for chest pain.  . pantoprazole (PROTONIX) 40 MG tablet Take 1 tablet (40 mg total) by mouth daily before breakfast.  . potassium chloride SA (K-DUR,KLOR-CON) 20 MEQ tablet Take 20 mEq by mouth daily.  . sitaGLIPtin-metformin (JANUMET) 50-1000 MG tablet Take 1 tablet by mouth 2 (two) times daily with a meal.   Current Facility-Administered Medications for the 09/18/18 encounter  (Office Visit) with [name], MD  Medication  . 0.9 %  sodium chloride infusion     Allergies:   Asa [aspirin]; Compazine [prochlorperazine edisylate]; Nsaids; Shellfish allergy; Sulfa antibiotics; Zantac [ranitidine hcl]; and Lisinopril   Social History   Socioeconomic History  . Marital status: Married    Spouse name: Not on file  . Number of children: Not on file  . Years of education: Not on file  . Highest education level: Not on file  Occupational History  . Not on file  Social Needs  . Financial resource strain: Not on file  . Food insecurity:    Worry: Not on file    Inability: Not  on file  . Transportation needs:    Medical: Not on file    Non-medical: Not on file  Tobacco Use  . Smoking status: Never Smoker  . Smokeless tobacco: Never Used  Substance and Sexual Activity  . Alcohol use: No  . Drug use: No  . Sexual activity: Not Currently  Lifestyle  . Physical activity:    Days per week: Not on file    Minutes per session: Not on file  . Stress: Not on file  Relationships  . Social connections:    Talks on phone: Not on file    Gets together: Not on file    Attends religious service: Not on file    Active member of club or organization: Not on file    Attends meetings of clubs or organizations: Not on file    Relationship status: Not on file  Other Topics Concern  . Not on file  Social History Narrative  . Not on file     Family History: The patient's family history includes Breast cancer (age of onset: 33) in her cousin; Diabetes in her mother; Healthy in her brother; Heart attack in her mother; Hodgkin's lymphoma in her maternal uncle; Hypertension in her mother; Lung cancer in her brother; Ulcerative colitis in her daughter.  ROS:   Please see the history of present illness.    Depression, stress, recent biopsy of the thyroid and breast.  Prior history of breast CA.  All other systems reviewed and are negative.  EKGs/Labs/Other Studies  Reviewed:    The following studies were reviewed today:  Cardial perfusion imaging September 01, 2018 Study Highlights     The left ventricular ejection fraction is hyperdynamic (>65%).  Nuclear stress EF: 67%.  There was no ST segment deviation noted during stress.  The study is normal.  This is a low risk study.   Normal stress nuclear study with no ischemia or infarction; EF 67 with normal wall motion.   2D Doppler echocardiogram September 01, 2018 Study Conclusions  - Left ventricle: The cavity size was normal. Wall thickness was   increased in a pattern of mild LVH. There was mild focal basal   hypertrophy of the septum. Systolic function was normal. The   estimated ejection fraction was in the range of 55% to 60%. Wall   motion was normal; there were no regional wall motion   abnormalities. Features are consistent with a pseudonormal left   ventricular filling pattern, with concomitant abnormal relaxation   and increased filling pressure (grade 2 diastolic dysfunction).   Doppler parameters are consistent with high ventricular filling   pressure. - Aortic valve: There was trivial regurgitation. - Mitral valve: Calcified annulus. There was mild regurgitation. - Left atrium: The atrium was mildly dilated. - Pulmonary arteries: Systolic pressure was mildly increased. PA   peak pressure: 32 mm Hg (S).  Impressions:  - Normal LV systolic function; moderate diastolic dysfunction; mild   LVH; trace AI; mild MR; mild LAE; trace TR with mild pulmonary   hypertension.   EKG:  EKG is not repeated.  Recent Labs: 08/22/2018: BUN 11; Creatinine, Ser 0.66; Hemoglobin 14.1; Platelets 351; Potassium 4.1; Sodium 132  Recent Lipid Panel    Component Value Date/Time   CHOL  08/13/2007 0615    116        ATP III CLASSIFICATION:  <200     mg/dL   Desirable  200-239  mg/dL   Borderline High  >=240    mg/dL  High   TRIG 69 08/13/2007 0615   HDL 41 08/13/2007 0615    CHOLHDL 2.8 08/13/2007 0615   VLDL 14 08/13/2007 0615   LDLCALC  08/13/2007 0615    61        Total Cholesterol/HDL:CHD Risk Coronary Heart Disease Risk Table                     Men   Women  1/2 Average Risk   3.4   3.3    Physical Exam:    VS:  BP 128/72   Pulse 77   Ht _0  (1.575 m)   Wt 166 lb 12.8 oz (75.7 kg)   SpO2 97%   BMI 30.51 kg/m     Wt Readings from Last 3 Encounters:  09/18/18 166 lb 12.8 oz (75.7 kg)  09/01/18 166 lb (75.3 kg)  08/22/18 166 lb 6.4 oz (75.5 kg)     GEN: Pale appearing. No acute distress HEENT: Normal NECK: No JVD. LYMPHATICS: No lymphadenopathy CARDIAC: RRR.  No murmur, no gallop, no edema VASCULAR: 2+ bilateral radial and carotid pulses, no bruits RESPIRATORY:  Clear to auscultation without rales, wheezing or rhonchi  ABDOMEN: Soft, non-tender, non-distended, No pulsatile mass, MUSCULOSKELETAL: No deformity  SKIN: Warm and dry NEUROLOGIC:  Alert and oriented x 3 PSYCHIATRIC:  Normal affect   ASSESSMENT:    1. Coronary artery disease involving coronary bypass graft of native heart with angina pectoris ([place])   2. Type 2 diabetes mellitus with complications ([place])   3. Essential hypertension   4. Mixed hyperlipidemia   5. OSA (obstructive sleep apnea)    PLAN:    In order of problems listed above:  1. Stable with no recurrence of angina since early January.  Had 5 episodes associated with physical activity which have subsequently resolved.  Plan continue observation with low risk myocardial perfusion study and normal LV function by echo.  Notify us if recurrent angina.  Plan 53-monthclinical follow-up.  Notify u[place]earlier if chest discomfort.   Medication Adjustments/Labs and Tests Ordered: Current medicines are reviewed at length with the patient today.  Concerns regarding medicines are outlined above.  No orders of the defined types were placed in this encounter.  No orders of the defined types were placed in this  encounter.   Patient Instructions  Medication Instructions:  Your physician recommends that you continue on your current medications as directed. Please refer to the Current Medication list given to you today.  If you need a refill on your cardiac medications before your next appointment, please call your pharmacy.    Lab work: None Ordered    Testing/Procedures: None Ordered   Follow-Up: At [facility] you and your health needs are our priority.  As part of our continuing mission to provide you with exceptional heart care, we have created designated Provider Care Teams.  These Care Teams include your primary Cardiologist (physician) and Advanced Practice Providers (APPs -  Physician Assistants and Nurse Practitioners) who all work together to provide you with the care you need, when you need it. You will need a follow up appointment in 6 months.  Please call our office 2 months in advance to schedule this appointment.  You may see HSinclair Grooms MD or one of the following Advanced Practice Providers on your designated Care Team:   LTruitt Merle NP LCecilie Kicks NP . JKathyrn Drown NP      Signed, HSinclair Grooms MD  09/18/2018 12:23 PM    [facility]

## 2018-09-18 ENCOUNTER — Encounter: Payer: Self-pay | Admitting: Interventional Cardiology

## 2018-09-18 ENCOUNTER — Ambulatory Visit: Payer: Medicare Other | Admitting: Interventional Cardiology

## 2018-09-18 VITALS — BP 128/72 | HR 77 | Ht 62.0 in | Wt 166.8 lb

## 2018-09-18 DIAGNOSIS — E782 Mixed hyperlipidemia: Secondary | ICD-10-CM | POA: Diagnosis not present

## 2018-09-18 DIAGNOSIS — I25709 Atherosclerosis of coronary artery bypass graft(s), unspecified, with unspecified angina pectoris: Secondary | ICD-10-CM

## 2018-09-18 DIAGNOSIS — G4733 Obstructive sleep apnea (adult) (pediatric): Secondary | ICD-10-CM

## 2018-09-18 DIAGNOSIS — E118 Type 2 diabetes mellitus with unspecified complications: Secondary | ICD-10-CM | POA: Diagnosis not present

## 2018-09-18 DIAGNOSIS — I10 Essential (primary) hypertension: Secondary | ICD-10-CM

## 2018-09-18 NOTE — Patient Instructions (Signed)
Medication Instructions:  Your physician recommends that you continue on your current medications as directed. Please refer to the Current Medication list given to you today.  If you need a refill on your cardiac medications before your next appointment, please call your pharmacy.    Lab work: None Ordered    Testing/Procedures: None Ordered   Follow-Up: At [facility], you and your health needs are our priority.  As part of our continuing mission to provide you with exceptional heart care, we have created designated Provider Care Teams.  These Care Teams include your primary Cardiologist (physician) and Advanced Practice Providers (APPs -  Physician Assistants and Nurse Practitioners) who all work together to provide you with the care you need, when you need it. You will need a follow up appointment in 6 months.  Please call our office 2 months in advance to schedule this appointment.  You may see Sinclair Grooms, MD or one of the following Advanced Practice Providers on your designated Care Team:   Truitt Merle, NP Cecilie Kicks, NP . Kathyrn Drown, NP

## 2018-09-19 DIAGNOSIS — E1165 Type 2 diabetes mellitus with hyperglycemia: Secondary | ICD-10-CM | POA: Diagnosis not present

## 2018-10-15 DIAGNOSIS — Z0120 Encounter for dental examination and cleaning without abnormal findings: Secondary | ICD-10-CM | POA: Diagnosis not present

## 2018-10-17 DIAGNOSIS — C50912 Malignant neoplasm of unspecified site of left female breast: Secondary | ICD-10-CM | POA: Diagnosis not present

## 2018-10-19 IMAGING — DX DG ABDOMEN 1V
1 series · 1 of 1 positions shown · non-contrast
Comparison: None.

CLINICAL DATA: 71-year-old female post endo capsule swallowed 2
weeks ago. No abdominal complaints. Initial encounter.

EXAM:
ABDOMEN - 1 VIEW

[abdomen kub]
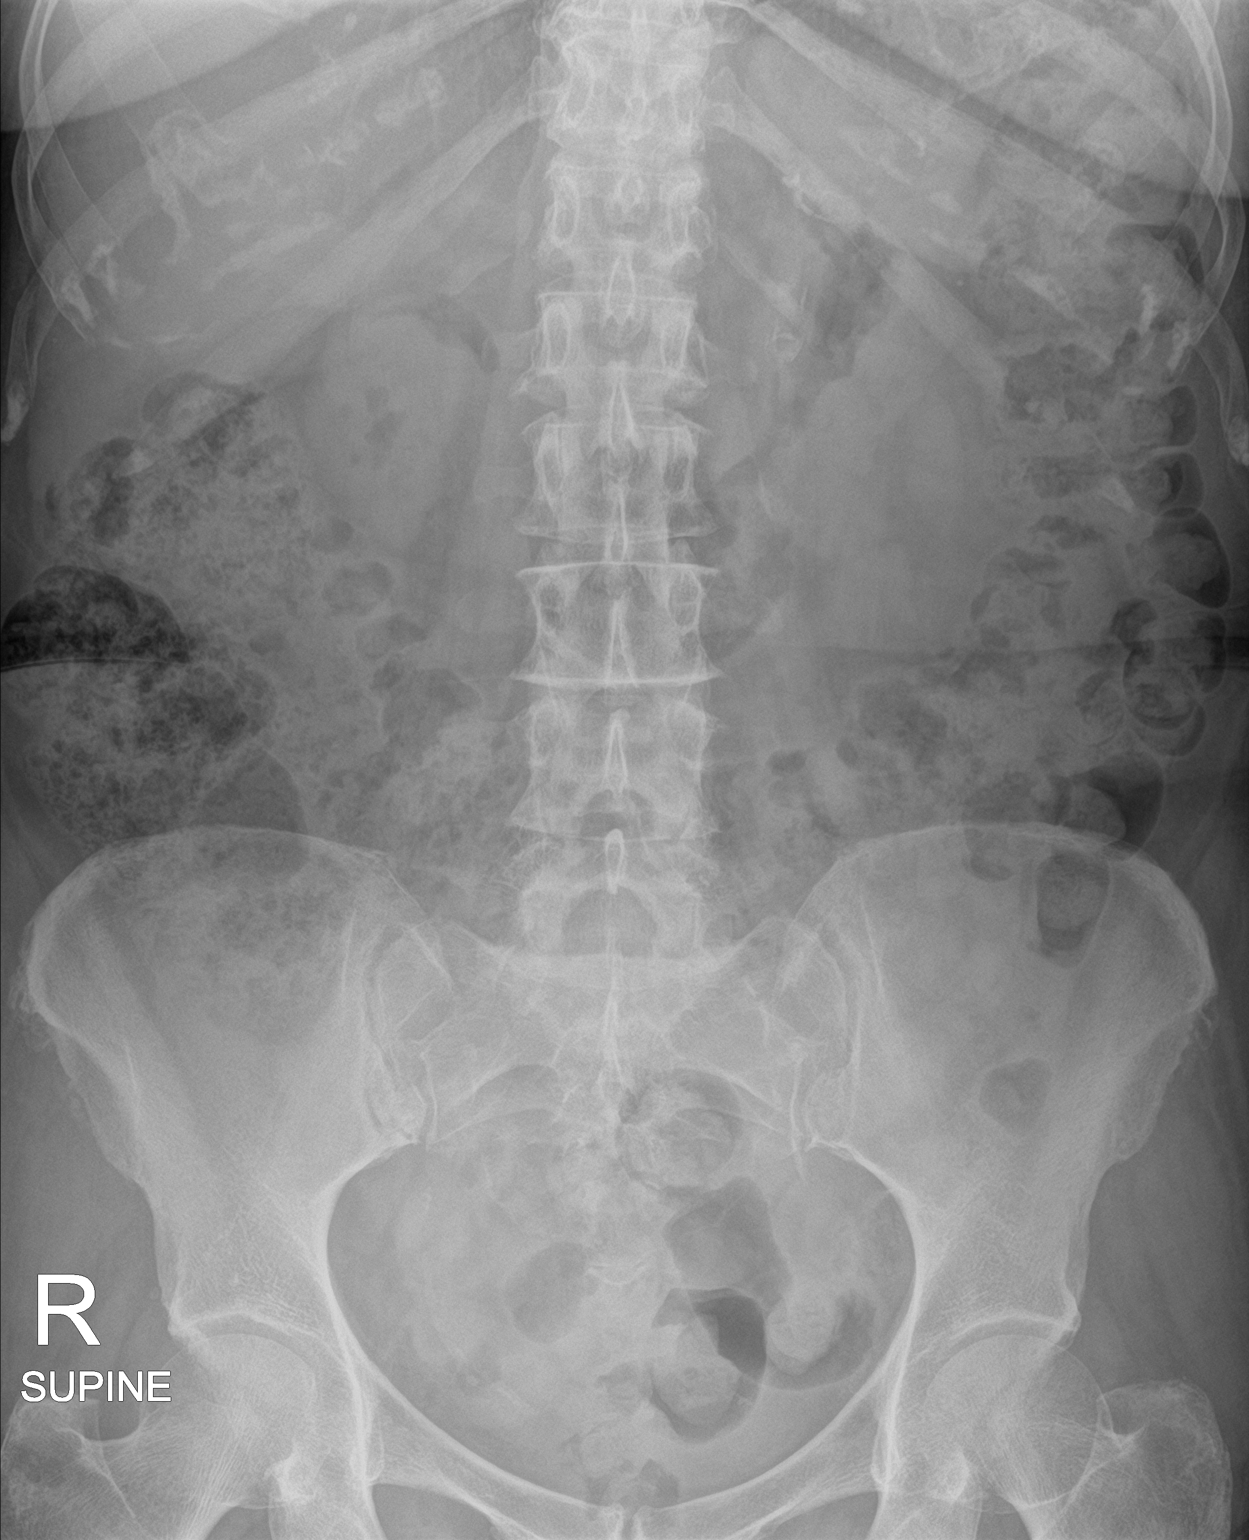

[1 of 1 positions shown; findings below may reference images not displayed]

FINDINGS: Moderate stool.

No radiopaque foreign body identified in the shape of a capsule.

Probable vascular calcifications.
IMPRESSION: No radiopaque foreign body identified in the shape of a capsule.

## 2018-10-22 DIAGNOSIS — E048 Other specified nontoxic goiter: Secondary | ICD-10-CM | POA: Diagnosis not present

## 2018-10-22 DIAGNOSIS — I5189 Other ill-defined heart diseases: Secondary | ICD-10-CM | POA: Diagnosis not present

## 2018-10-22 DIAGNOSIS — E668 Other obesity: Secondary | ICD-10-CM | POA: Diagnosis not present

## 2018-10-22 DIAGNOSIS — E1159 Type 2 diabetes mellitus with other circulatory complications: Secondary | ICD-10-CM | POA: Diagnosis not present

## 2018-11-10 ENCOUNTER — Ambulatory Visit: Payer: Medicare Other | Admitting: Interventional Cardiology

## 2018-11-12 DIAGNOSIS — G4733 Obstructive sleep apnea (adult) (pediatric): Secondary | ICD-10-CM | POA: Diagnosis not present

## 2019-01-04 DIAGNOSIS — H0014 Chalazion left upper eyelid: Secondary | ICD-10-CM | POA: Diagnosis not present

## 2019-01-21 DIAGNOSIS — H00035 Abscess of left lower eyelid: Secondary | ICD-10-CM | POA: Diagnosis not present

## 2019-01-21 DIAGNOSIS — H10413 Chronic giant papillary conjunctivitis, bilateral: Secondary | ICD-10-CM | POA: Diagnosis not present

## 2019-01-21 DIAGNOSIS — H0015 Chalazion left lower eyelid: Secondary | ICD-10-CM | POA: Diagnosis not present

## 2019-02-04 DIAGNOSIS — H10413 Chronic giant papillary conjunctivitis, bilateral: Secondary | ICD-10-CM | POA: Diagnosis not present

## 2019-02-04 DIAGNOSIS — H0015 Chalazion left lower eyelid: Secondary | ICD-10-CM | POA: Diagnosis not present

## 2019-02-11 ENCOUNTER — Other Ambulatory Visit: Payer: Self-pay | Admitting: Hematology and Oncology

## 2019-02-11 DIAGNOSIS — Z170 Estrogen receptor positive status [ER+]: Secondary | ICD-10-CM

## 2019-02-11 DIAGNOSIS — C50412 Malignant neoplasm of upper-outer quadrant of left female breast: Secondary | ICD-10-CM

## 2019-02-12 ENCOUNTER — Telehealth: Payer: Self-pay

## 2019-02-12 ENCOUNTER — Other Ambulatory Visit: Payer: Self-pay | Admitting: Hematology and Oncology

## 2019-02-12 DIAGNOSIS — C50412 Malignant neoplasm of upper-outer quadrant of left female breast: Secondary | ICD-10-CM

## 2019-02-12 DIAGNOSIS — Z1231 Encounter for screening mammogram for malignant neoplasm of breast: Secondary | ICD-10-CM

## 2019-02-12 NOTE — Telephone Encounter (Signed)
RN spoke with patient, pt attempted to scheduled mammogram today and notified scheduling staff that she was having a lump/nodule to left breast.  RN updated orders for u/s to left breast.  Pt aware, voiced understanding.

## 2019-02-20 DIAGNOSIS — Z0000 Encounter for general adult medical examination without abnormal findings: Secondary | ICD-10-CM | POA: Diagnosis not present

## 2019-02-20 DIAGNOSIS — K219 Gastro-esophageal reflux disease without esophagitis: Secondary | ICD-10-CM | POA: Diagnosis not present

## 2019-02-20 DIAGNOSIS — E7800 Pure hypercholesterolemia, unspecified: Secondary | ICD-10-CM | POA: Diagnosis not present

## 2019-02-20 DIAGNOSIS — I10 Essential (primary) hypertension: Secondary | ICD-10-CM | POA: Diagnosis not present

## 2019-02-25 ENCOUNTER — Other Ambulatory Visit: Payer: Self-pay | Admitting: Hematology and Oncology

## 2019-02-25 DIAGNOSIS — Z853 Personal history of malignant neoplasm of breast: Secondary | ICD-10-CM

## 2019-02-25 DIAGNOSIS — E1129 Type 2 diabetes mellitus with other diabetic kidney complication: Secondary | ICD-10-CM | POA: Diagnosis not present

## 2019-02-27 DIAGNOSIS — E871 Hypo-osmolality and hyponatremia: Secondary | ICD-10-CM | POA: Diagnosis not present

## 2019-02-27 DIAGNOSIS — E1129 Type 2 diabetes mellitus with other diabetic kidney complication: Secondary | ICD-10-CM | POA: Diagnosis not present

## 2019-02-27 DIAGNOSIS — D509 Iron deficiency anemia, unspecified: Secondary | ICD-10-CM | POA: Diagnosis not present

## 2019-02-27 DIAGNOSIS — N08 Glomerular disorders in diseases classified elsewhere: Secondary | ICD-10-CM | POA: Diagnosis not present

## 2019-03-02 ENCOUNTER — Other Ambulatory Visit: Payer: Self-pay

## 2019-03-02 ENCOUNTER — Ambulatory Visit: Payer: Medicare Other

## 2019-03-02 ENCOUNTER — Ambulatory Visit
Admission: RE | Admit: 2019-03-02 | Discharge: 2019-03-02 | Disposition: A | Discharge status: Home / Self Care | Payer: Medicare Other | Source: Ambulatory Visit | Attending: Hematology and Oncology | Admitting: Hematology and Oncology

## 2019-03-02 DIAGNOSIS — Z853 Personal history of malignant neoplasm of breast: Secondary | ICD-10-CM | POA: Diagnosis not present

## 2019-03-02 DIAGNOSIS — R921 Mammographic calcification found on diagnostic imaging of breast: Secondary | ICD-10-CM | POA: Diagnosis not present

## 2019-03-04 DIAGNOSIS — E039 Hypothyroidism, unspecified: Secondary | ICD-10-CM | POA: Diagnosis not present

## 2019-03-04 DIAGNOSIS — E1165 Type 2 diabetes mellitus with hyperglycemia: Secondary | ICD-10-CM | POA: Diagnosis not present

## 2019-03-04 DIAGNOSIS — E7800 Pure hypercholesterolemia, unspecified: Secondary | ICD-10-CM | POA: Diagnosis not present

## 2019-03-04 DIAGNOSIS — I10 Essential (primary) hypertension: Secondary | ICD-10-CM | POA: Diagnosis not present

## 2019-04-09 DIAGNOSIS — L821 Other seborrheic keratosis: Secondary | ICD-10-CM | POA: Diagnosis not present

## 2019-04-09 DIAGNOSIS — L720 Epidermal cyst: Secondary | ICD-10-CM | POA: Diagnosis not present

## 2019-04-09 DIAGNOSIS — L304 Erythema intertrigo: Secondary | ICD-10-CM | POA: Diagnosis not present

## 2019-04-09 DIAGNOSIS — L738 Other specified follicular disorders: Secondary | ICD-10-CM | POA: Diagnosis not present

## 2019-04-10 DIAGNOSIS — H0014 Chalazion left upper eyelid: Secondary | ICD-10-CM | POA: Diagnosis not present

## 2019-04-10 DIAGNOSIS — H04123 Dry eye syndrome of bilateral lacrimal glands: Secondary | ICD-10-CM | POA: Diagnosis not present

## 2019-04-19 ENCOUNTER — Other Ambulatory Visit: Payer: Self-pay | Admitting: Cardiology

## 2019-04-22 DIAGNOSIS — Z23 Encounter for immunization: Secondary | ICD-10-CM | POA: Diagnosis not present

## 2019-04-29 DIAGNOSIS — Z0120 Encounter for dental examination and cleaning without abnormal findings: Secondary | ICD-10-CM | POA: Diagnosis not present

## 2019-05-04 ENCOUNTER — Other Ambulatory Visit: Payer: Self-pay | Admitting: Hematology and Oncology

## 2019-05-04 DIAGNOSIS — Z170 Estrogen receptor positive status [ER+]: Secondary | ICD-10-CM

## 2019-05-04 DIAGNOSIS — C50412 Malignant neoplasm of upper-outer quadrant of left female breast: Secondary | ICD-10-CM

## 2019-05-06 DIAGNOSIS — H0014 Chalazion left upper eyelid: Secondary | ICD-10-CM | POA: Diagnosis not present

## 2019-05-06 DIAGNOSIS — D23121 Other benign neoplasm of skin of left upper eyelid, including canthus: Secondary | ICD-10-CM | POA: Diagnosis not present

## 2019-05-27 ENCOUNTER — Other Ambulatory Visit: Payer: Self-pay

## 2019-05-27 DIAGNOSIS — D509 Iron deficiency anemia, unspecified: Secondary | ICD-10-CM

## 2019-05-28 ENCOUNTER — Other Ambulatory Visit: Payer: Self-pay

## 2019-05-28 ENCOUNTER — Inpatient Hospital Stay: Payer: Medicare Other | Attending: Hematology and Oncology

## 2019-05-28 DIAGNOSIS — Z79899 Other long term (current) drug therapy: Secondary | ICD-10-CM | POA: Diagnosis not present

## 2019-05-28 DIAGNOSIS — Z170 Estrogen receptor positive status [ER+]: Secondary | ICD-10-CM | POA: Diagnosis not present

## 2019-05-28 DIAGNOSIS — D509 Iron deficiency anemia, unspecified: Secondary | ICD-10-CM | POA: Diagnosis not present

## 2019-05-28 DIAGNOSIS — C50412 Malignant neoplasm of upper-outer quadrant of left female breast: Secondary | ICD-10-CM | POA: Diagnosis not present

## 2019-05-28 DIAGNOSIS — Z79811 Long term (current) use of aromatase inhibitors: Secondary | ICD-10-CM | POA: Diagnosis not present

## 2019-05-28 LAB — CBC WITH DIFFERENTIAL (CANCER CENTER ONLY)
Abs Immature Granulocytes: 0.05 10*3/uL (ref 0.00–0.07)
Basophils Absolute: 0.1 10*3/uL (ref 0.0–0.1)
Basophils Relative: 1 %
Eosinophils Absolute: 0.9 10*3/uL — ABNORMAL HIGH (ref 0.0–0.5)
Eosinophils Relative: 10 %
HCT: 39 % (ref 36.0–46.0)
Hemoglobin: 13 g/dL (ref 12.0–15.0)
Immature Granulocytes: 1 %
Lymphocytes Relative: 16 %
Lymphs Abs: 1.5 10*3/uL (ref 0.7–4.0)
MCH: 30.4 pg (ref 26.0–34.0)
MCHC: 33.3 g/dL (ref 30.0–36.0)
MCV: 91.1 fL (ref 80.0–100.0)
Monocytes Absolute: 0.7 10*3/uL (ref 0.1–1.0)
Monocytes Relative: 7 %
Neutro Abs: 6.5 10*3/uL (ref 1.7–7.7)
Neutrophils Relative %: 65 %
Platelet Count: 287 10*3/uL (ref 150–400)
RBC: 4.28 MIL/uL (ref 3.87–5.11)
RDW: 13.2 % (ref 11.5–15.5)
WBC Count: 9.8 10*3/uL (ref 4.0–10.5)
nRBC: 0 % (ref 0.0–0.2)

## 2019-05-28 LAB — IRON AND TIBC
Iron: 59 ug/dL (ref 41–142)
Saturation Ratios: 20 % — ABNORMAL LOW (ref 21–57)
TIBC: 290 ug/dL (ref 236–444)
UIBC: 231 ug/dL (ref 120–384)

## 2019-05-28 LAB — FERRITIN: Ferritin: 168 ng/mL (ref 11–307)

## 2019-05-29 ENCOUNTER — Other Ambulatory Visit: Payer: Self-pay

## 2019-05-29 ENCOUNTER — Inpatient Hospital Stay: Payer: Medicare Other | Admitting: Hematology and Oncology

## 2019-05-29 DIAGNOSIS — D509 Iron deficiency anemia, unspecified: Secondary | ICD-10-CM | POA: Diagnosis not present

## 2019-05-29 DIAGNOSIS — Z170 Estrogen receptor positive status [ER+]: Secondary | ICD-10-CM | POA: Diagnosis not present

## 2019-05-29 DIAGNOSIS — Z79811 Long term (current) use of aromatase inhibitors: Secondary | ICD-10-CM | POA: Diagnosis not present

## 2019-05-29 DIAGNOSIS — C50412 Malignant neoplasm of upper-outer quadrant of left female breast: Secondary | ICD-10-CM | POA: Diagnosis not present

## 2019-05-29 DIAGNOSIS — Z79899 Other long term (current) drug therapy: Secondary | ICD-10-CM | POA: Diagnosis not present

## 2019-05-29 MED ORDER — ANASTROZOLE 1 MG PO TABS: 1.0000 mg | ORAL_TABLET | Freq: Every day | ORAL | 3 refills | Status: DC

## 2019-05-29 NOTE — Assessment & Plan Note (Signed)
Left breast biopsy02/19/2018: 12:30: IDC with DCIS involving underlying papillary lesion, grade 1, ER 100%, PR 90%, Ki-67 10%, HER-2 negative ratio 1.29; irregular 1.9 cm mass axilla negative, T1 CN 0 stage IA clinical stage Left lumpectomy 12/25/2016: IDC grade 1, 1.5 cm, low-grade DCIS, resection margins negative, 0/2 lymph nodes negative, ER 100%, PR 100%, HER-2 negative ratio 1.29, Ki-67 10% T1 cN0 stage IA Oncotype DX score 2: 4% risk of recurrence  Adjuvant radiation therapy 01/30/2017-02/21/2017  Treatment plan: Anastrozole 1 mg by mouth daily 5 yearsstarted 02/21/17  Anastrozole Toxicities:Complains of diffuse musculoskeletal aches and pains in the shoulders hips and knees. She attributes this to arthritis.  Surveillance: 1. Breast Exam: benign, surgical scars 2. Mammogram:  03/02/2019: Benign breast density category B  Iron deficiency anemia IV iron therapy January 2019 Lab review: 05/22/2018: Iron saturation 19%, ferritin 234, hemoglobin 13.8 No further evidence of iron deficiency. Return to clinic in 1 year for follow-up with labs done ahead of time.

## 2019-05-29 NOTE — Progress Notes (Signed)
Patient Care Team: Shirline Frees, MD as PCP - General (Family Medicine) [name], MD as PCP - Cardiology (Cardiology) Delice Bison, Charlestine Massed, NP as Nurse Practitioner (Hematology and Oncology) [name], MD as Consulting Physician (Hematology and Oncology) Eppie Gibson, MD as Attending Physician (Radiation Oncology) [name], MD as Consulting Physician (General Surgery)  DIAGNOSIS:    ICD-10-CM   1. Malignant neoplasm of upper-outer quadrant of left breast in female, estrogen receptor positive ([place])  C50.412    Z17.0     SUMMARY OF ONCOLOGIC HISTORY: Oncology History  Malignant neoplasm of upper-outer quadrant of left breast in female, estrogen receptor positive ([place])  10/01/2016 Initial Diagnosis   Left breast biopsy: 12:30: IDC with DCIS involving underlying papillary lesion, grade 1, ER 100%, PR 90%, Ki-67 10%, HER-2 negative ratio 1.29; irregular 1.9 cm mass axilla negative, T1 CN 0 stage IA clinical stage   12/25/2016 Surgery   Left lumpectomy: IDC grade 1, 1.5 cm, low-grade DCIS, resection margins negative, 0/2 lymph nodes negative, ER 100%, PR 100%, HER-2 negative ratio 1.29, Ki-67 10% T1 cN0 stage IA   12/25/2016 Oncotype testing   Oncotype DX score 2: Risk of recurrence 4%   01/30/2017 - 02/21/2017 Radiation Therapy   Adjuvant radiation therapy   02/2017 -  Anti-estrogen oral therapy   Anastrozole daily   03/25/2017 Genetic Testing   Genetic counseling and testing for hereditary cancer syndromes performed on 02/19/2017. Results are negative for pathogenic mutations in 46 genes analyzed by [facility]'s Common Hereditary Cancers Panel. Results are dated 03/25/2017. Genes tested: APC, ATM, AXIN2, BARD1, BMPR1A, BRCA1, BRCA2, BRIP1, CDH1, CDKN2A, CHEK2, CTNNA1, DICER1, EPCAM, GREM1, HOXB13, KIT, MEN1, MLH1, MSH2, MSH3, MSH6, MUTYH, NBN, NF1, NTHL1, PALB2, PDGFRA, PMS2, POLD1, POLE, PTEN, RAD50, RAD51C, RAD51D, SDHA, SDHB, SDHC, SDHD, SMAD4, SMARCA4, STK11,  TP53, TSC1, TSC2, and VHL.        CHIEF COMPLIANT: Follow-up of breast cancer and iron deficiency anemia  INTERVAL HISTORY: Susan Barnett is a 73 y.o. with above-mentioned history of left breast cancer treated with lumpectomy, radiation, and who is currently on anastrozole therapy. She also has a history of iron deficiency anemia and has previously received IV iron. I last saw her a year ago. Mammogram on 03/02/19 showed no evidence of malignancy bilaterally. She presents to the clinic today for annual follow-up.   REVIEW OF SYSTEMS:   Constitutional: Denies fevers, chills or abnormal weight loss Eyes: Denies blurriness of vision Ears, nose, mouth, throat, and face: Denies mucositis or sore throat Respiratory: Denies cough, dyspnea or wheezes Cardiovascular: Denies palpitation, chest discomfort Gastrointestinal: Denies nausea, heartburn or change in bowel habits Skin: Denies abnormal skin rashes Lymphatics: Denies new lymphadenopathy or easy bruising Neurological: Denies numbness, tingling or new weaknesses Behavioral/Psych: Mood is stable, no new changes  Extremities: No lower extremity edema Breast: denies any pain or lumps or nodules in either breasts, small pimple around the nipple All other systems were reviewed with the patient and are negative.  I have reviewed the past medical history, past surgical history, social history and family history with the patient and they are unchanged from previous note.  ALLERGIES:  is allergic to asa [aspirin]; compazine [prochlorperazine edisylate]; nsaids; shellfish allergy; sulfa antibiotics; zantac [ranitidine hcl]; and lisinopril.  MEDICATIONS:  Current Outpatient Medications  Medication Sig Dispense Refill   amLODipine (NORVASC) 5 MG tablet Take 5 mg by mouth daily.      anastrozole (ARIMIDEX) 1 MG tablet Take 1 tablet by mouth once daily 90  tablet 0   atorvastatin (LIPITOR) 10 MG tablet Take 10 mg by mouth every evening.       cloNIDine (CATAPRES) 0.1 MG tablet Take 1 tablet by mouth twice daily 180 tablet 2   clopidogrel (PLAVIX) 75 MG tablet Take 75 mg by mouth daily.     Coenzyme Q10 300 MG CAPS Take 300 mg by mouth 2 (two) times daily.     diphenhydramine-acetaminophen (TYLENOL PM) 25-500 MG TABS tablet Take 1 tablet by mouth at bedtime as needed.     hydrochlorothiazide (HYDRODIURIL) 25 MG tablet Take 1 tablet (25 mg total) by mouth daily. 90 tablet 3   LANTUS SOLOSTAR 100 UNIT/ML Solostar Pen Inject 40 Units into the skin at bedtime. 15 mL 0   levothyroxine (SYNTHROID, LEVOTHROID) 75 MCG tablet Take 75 mcg by mouth daily before breakfast.      loratadine (CLARITIN) 10 MG tablet Take 1 tablet (10 mg total) by mouth daily.     losartan (COZAAR) 100 MG tablet Take 1 tablet (100 mg total) by mouth daily. 90 tablet 3   metoprolol (LOPRESSOR) 50 MG tablet Take 1 tablet (50 mg total) by mouth 2 (two) times daily. 180 tablet 3   nitroGLYCERIN (NITROSTAT) 0.4 MG SL tablet Place 1 tablet (0.4 mg total) under the tongue every 5 (five) minutes as needed for chest pain. 25 tablet 3   pantoprazole (PROTONIX) 40 MG tablet Take 1 tablet (40 mg total) by mouth daily before breakfast. 90 tablet 3   potassium chloride SA (K-DUR,KLOR-CON) 20 MEQ tablet Take 20 mEq by mouth daily.  2   sitaGLIPtin-metformin (JANUMET) 50-1000 MG tablet Take 1 tablet by mouth 2 (two) times daily with a meal.     Current Facility-Administered Medications  Medication Dose Route Frequency Provider Last Rate Last Dose   0.9 %  sodium chloride infusion  500 mL Intravenous Continuous Irene Shipper, MD        PHYSICAL EXAMINATION: ECOG PERFORMANCE STATUS: 1 - Symptomatic but completely ambulatory  Vitals:   05/29/19 1124  BP: (!) 122/110  Pulse: 80  Resp: 16  Temp: 98.2 F (36.8 C)  SpO2: 100%   Filed Weights   05/29/19 1124  Weight: 163 lb 6.4 oz (74.1 kg)    GENERAL: alert, no distress and comfortable SKIN: skin color,  texture, turgor are normal, no rashes or significant lesions EYES: normal, Conjunctiva are pink and non-injected, sclera clear OROPHARYNX: no exudate, no erythema and lips, buccal mucosa, and tongue normal  NECK: supple, thyroid normal size, non-tender, without nodularity LYMPH: no palpable lymphadenopathy in the cervical, axillary or inguinal LUNGS: clear to auscultation and percussion with normal breathing effort HEART: regular rate & rhythm and no murmurs and no lower extremity edema ABDOMEN: abdomen soft, non-tender and normal bowel sounds MUSCULOSKELETAL: no cyanosis of digits and no clubbing  NEURO: alert & oriented x 3 with fluent speech, no focal motor/sensory deficits EXTREMITIES: No lower extremity edema BREAST: No palpable masses or nodules in either right or left breasts. No palpable axillary supraclavicular or infraclavicular adenopathy no breast tenderness or nipple discharge. (exam performed in the presence of a chaperone)  LABORATORY DATA:  I have reviewed the data as listed CMP Latest Ref Rng & Units 08/22/2018 06/11/2017 05/16/2017  Glucose 65 - 99 mg/dL 284(H) 169(H) 164(H)  BUN 8 - 27 mg/dL 11 11 15   Creatinine 0.57 - 1.00 mg/dL 0.66 0.67 0.72  Sodium 134 - 144 mmol/L 132(L) 133(L) 133(L)  Potassium 3.5 - 5.2 mmol/L  4.1 4.2 4.1  Chloride 96 - 106 mmol/L 89(L) 92(L) 94(L)  CO2 20 - 29 mmol/L 23 27 21   Calcium 8.7 - 10.3 mg/dL 10.0 9.8 9.7  Total Protein 6.5 - 8.1 g/dL - - -  Total Bilirubin 0.3 - 1.2 mg/dL - - -  Alkaline Phos 38 - 126 U/L - - -  AST 15 - 41 U/L - - -  ALT 14 - 54 U/L - - -    Lab Results  Component Value Date   WBC 9.8 05/28/2019   HGB 13.0 05/28/2019   HCT 39.0 05/28/2019   MCV 91.1 05/28/2019   PLT 287 05/28/2019   NEUTROABS 6.5 05/28/2019    ASSESSMENT & PLAN:  Malignant neoplasm of upper-outer quadrant of left breast in female, estrogen receptor positive ([facility]) Left breast biopsy02/19/2018: 12:30: IDC with DCIS involving underlying  papillary lesion, grade 1, ER 100%, PR 90%, Ki-67 10%, HER-2 negative ratio 1.29; irregular 1.9 cm mass axilla negative, T1 CN 0 stage IA clinical stage Left lumpectomy 12/25/2016: IDC grade 1, 1.5 cm, low-grade DCIS, resection margins negative, 0/2 lymph nodes negative, ER 100%, PR 100%, HER-2 negative ratio 1.29, Ki-67 10% T1 cN0 stage IA Oncotype DX score 2: 4% risk of recurrence  Adjuvant radiation therapy 01/30/2017-02/21/2017  Treatment plan: Anastrozole 1 mg by mouth daily 5 yearsstarted 02/21/17  Anastrozole Toxicities:Complains of diffuse musculoskeletal aches and pains in the shoulders hips and knees. She attributes this to arthritis.  Surveillance: 1. Breast Exam: benign, surgical scars 2. Mammogram:  03/02/2019: Benign breast density category B  Iron deficiency anemia IV iron therapy January 2019 Lab review: 05/22/2018: Iron saturation 20%, ferritin 168, hemoglobin 13 No further evidence of iron deficiency. Since her labs have been excellent I do not think we need to repeat iron studies again in the future unless her primary care finds her to be anemic again.  Return to clinic in 1 year for follow-up  No orders of the defined types were placed in this encounter.  The patient has a good understanding of the overall plan. she agrees with it. she will call with any problems that may develop before the next visit here.  [name], MD 05/29/2019  Julious Oka Dorshimer am acting as scribe for Dr. [name].  I have reviewed the above documentation for accuracy and completeness, and I agree with the above.

## 2019-06-03 ENCOUNTER — Telehealth: Payer: Self-pay | Admitting: Hematology and Oncology

## 2019-06-03 NOTE — Telephone Encounter (Signed)
I talk with patient regarding schedule  

## 2019-06-28 ENCOUNTER — Other Ambulatory Visit: Payer: Self-pay | Admitting: Nurse Practitioner

## 2019-07-03 DIAGNOSIS — H0100A Unspecified blepharitis right eye, upper and lower eyelids: Secondary | ICD-10-CM | POA: Diagnosis not present

## 2019-07-03 DIAGNOSIS — H0100B Unspecified blepharitis left eye, upper and lower eyelids: Secondary | ICD-10-CM | POA: Diagnosis not present

## 2019-07-03 DIAGNOSIS — H04123 Dry eye syndrome of bilateral lacrimal glands: Secondary | ICD-10-CM | POA: Diagnosis not present

## 2019-07-06 DIAGNOSIS — E049 Nontoxic goiter, unspecified: Secondary | ICD-10-CM | POA: Diagnosis not present

## 2019-07-06 DIAGNOSIS — C50919 Malignant neoplasm of unspecified site of unspecified female breast: Secondary | ICD-10-CM | POA: Diagnosis not present

## 2019-07-06 DIAGNOSIS — E1129 Type 2 diabetes mellitus with other diabetic kidney complication: Secondary | ICD-10-CM | POA: Diagnosis not present

## 2019-07-06 DIAGNOSIS — I739 Peripheral vascular disease, unspecified: Secondary | ICD-10-CM | POA: Diagnosis not present

## 2019-07-07 DIAGNOSIS — E1129 Type 2 diabetes mellitus with other diabetic kidney complication: Secondary | ICD-10-CM | POA: Diagnosis not present

## 2019-07-13 ENCOUNTER — Ambulatory Visit
Admission: EM | Admit: 2019-07-13 | Discharge: 2019-07-13 | Disposition: A | Discharge status: Home / Self Care | Payer: Medicare Other | Attending: Emergency Medicine | Admitting: Emergency Medicine

## 2019-07-13 ENCOUNTER — Encounter: Payer: Self-pay | Admitting: Emergency Medicine

## 2019-07-13 ENCOUNTER — Other Ambulatory Visit: Payer: Self-pay

## 2019-07-13 DIAGNOSIS — I10 Essential (primary) hypertension: Secondary | ICD-10-CM

## 2019-07-13 DIAGNOSIS — B9689 Other specified bacterial agents as the cause of diseases classified elsewhere: Secondary | ICD-10-CM

## 2019-07-13 DIAGNOSIS — N3001 Acute cystitis with hematuria: Secondary | ICD-10-CM

## 2019-07-13 LAB — POCT URINALYSIS DIP (MANUAL ENTRY)
Bilirubin, UA: NEGATIVE
Glucose, UA: 100 mg/dL — AB
Ketones, POC UA: NEGATIVE mg/dL
Nitrite, UA: NEGATIVE
Protein Ur, POC: NEGATIVE mg/dL
Spec Grav, UA: 1.015 (ref 1.010–1.025)
Urobilinogen, UA: 0.2 E.U./dL
pH, UA: 7.5 (ref 5.0–8.0)

## 2019-07-13 MED ORDER — CEPHALEXIN 500 MG PO CAPS: 500.0000 mg | ORAL_CAPSULE | Freq: Two times a day (BID) | ORAL | 0 refills | Status: AC

## 2019-07-13 MED ORDER — PHENAZOPYRIDINE HCL 100 MG PO TABS: 100.0000 mg | ORAL_TABLET | Freq: Three times a day (TID) | ORAL | 0 refills | Status: DC | PRN

## 2019-07-13 NOTE — ED Triage Notes (Signed)
Pt presents to [facility] for assessment of 2 days of burning with urination, foul smell urine, and urinary frequency.

## 2019-07-13 NOTE — ED Notes (Signed)
Patient able to ambulate independently  

## 2019-07-13 NOTE — Discharge Instructions (Addendum)
Take antibiotic as directed.  May use AZO as needed for symptom relief.

## 2019-07-13 NOTE — ED Provider Notes (Signed)
[name]    CSN: [id number] Arrival date & time: 07/13/19  1320      History   Chief Complaint Chief Complaint  Patient presents with  . Dysuria    HPI Susan Barnett is a 73 y.o. female presenting for 2-day course of burning with urination, malodorous urine, urinary frequency.  Patient denies abdominal back pain, history of pyelonephritis, renal calculi.  No fever, myalgias, chills.  Has not tried anything for symptoms.   Past Medical History:  Diagnosis Date  . Anemia    mild  . Anginal pain ([place])    with exertion, last time prior to cardiac caht 06/02/14  . Anxiety    situational  . Arthritis   . Breast cancer ([place]) 2018   Left Breast Cancer  . CAD (coronary artery disease)    Dr. Linard Millers follows, no problems now  . Cancer [facility])    Breast cancer  . Complication of anesthesia   . Diabetes mellitus, type 2 ([place])    TYPE 2  . Dysrhythmia    occ skips a beat, rate was fast before beta blocker.  . Gastric polyp   . Genetic testing 04/12/2017   [name] underwent genetic counseling and testing for hereditary cancer syndromes on 02/19/2017. Her results were negative for mutations in all 46 genes analyzed by [facility]'s 46-gene Common Hereditary Cancers Panel. Genes analyzed include: APC, ATM, AXIN2, BARD1, BMPR1A, BRCA1, BRCA2, BRIP1, CDH1, CDKN2A, CHEK2, CTNNA1, DICER1, EPCAM, GREM1, HOXB13, KIT, MEN1, MLH1, MSH2, MSH3, MSH6, MUTYH, NBN  . GERD (gastroesophageal reflux disease)   . Headache    hx of  . Heartburn   . Hiatal hernia    mild head tremors  . History of radiation therapy 01/30/17- 02/21/17   Left Breast 42.56 Gy in 16 fractions  . Hypertension   . Hypothyroidism   . Occasional tremors    "of head", slight hands  . Osteopenia   . Personal history of radiation therapy 2018   Left Breast Cancer  . Pneumonia   . PONV (postoperative nausea and vomiting) 02/24/14  . Shortness of breath    with exertion  . Sleep apnea    cpap used  nightly x 2 yrs now.    Patient Active Problem List   Diagnosis Date Noted  . Iron deficiency anemia 08/20/2017  . OSA (obstructive sleep apnea) 05/16/2017  . Genetic testing 04/12/2017  . Malignant neoplasm of upper-outer quadrant of left breast in female, estrogen receptor positive ([place]) 10/15/2016  . Hyperlipidemia 07/14/2015  . Coronary artery disease involving coronary bypass graft of native heart with angina pectoris ([place]) 06/14/2014  . Angina pectoris, crescendo ([place]) 06/02/2014  . Lateral meniscal tear 02/23/2014  . GASTRIC POLYP 11/19/2007  . HYPOTHYROIDISM 11/19/2007  . Type 2 diabetes mellitus with complications ([place]) 57/90/3833  . Essential hypertension 11/19/2007  . GERD 11/19/2007  . HIATAL HERNIA 11/19/2007  . RECTAL BLEEDING 11/19/2007  . HEARTBURN 11/19/2007    Past Surgical History:  Procedure Laterality Date  . BREAST BIOPSY Left 1987  . BREAST LUMPECTOMY Left 01/25/2017  . BREAST LUMPECTOMY WITH RADIOACTIVE SEED AND SENTINEL LYMPH NODE BIOPSY Left 12/25/2016   Procedure: BREAST LUMPECTOMY WITH RADIOACTIVE SEED AND SENTINEL LYMPH NODE BIOPSY;  Surgeon: [name], MD;  Location: [facility];  Service: General;  Laterality: Left;  . CARDIAC CATHETERIZATION  06/02/2014   LAD 30%, D1 80%, CFX 755, RCA > 95% ISR, rx w/ PTCA, heavy calcification, EF 65%  . CORONARY ARTERY BYPASS  GRAFT N/A 06/14/2014   Procedure: CORONARY ARTERY BYPASS GRAFTING (CABG);  Surgeon: Gaye Pollack, MD;  Location: [facility];  Service: Open Heart Surgery;  Laterality: N/A;  Times 2 using endoscopically harvested right saphenous vein.  . coronary stents     x3 stents placed-prior ablation  . DILATION AND CURETTAGE OF UTERUS    . ELBOW FRACTURE SURGERY Left   . INCONTINENCE SURGERY     sling  . KNEE ARTHROSCOPY Right 02/24/2014   Procedure: RIGHT KNEE ARTHROSCOPY WITH DEBRIDEMENT ;  Surgeon: Gearlean Alf, MD;  Location: [facility];  Service: Orthopedics;  Laterality: Right;  . LEFT HEART  CATHETERIZATION WITH CORONARY ANGIOGRAM N/A 06/02/2014   Procedure: LEFT HEART CATHETERIZATION WITH CORONARY ANGIOGRAM;  Surgeon: Sinclair Grooms, MD;  [facility]: [facility] CATH [facility];  Service: Cardiovascular;  Laterality: N/A;  . TEE WITHOUT CARDIOVERSION N/A 06/14/2014   Procedure: TRANSESOPHAGEAL ECHOCARDIOGRAM (TEE);  Surgeon: Gaye Pollack, MD;  Location: [facility];  Service: Open Heart Surgery;  Laterality: N/A;    OB History   No obstetric history on file.      Home Medications    Prior to Admission medications   Medication Sig Start Date End Date Taking? Authorizing Provider  cetirizine (ZYRTEC) 10 MG tablet Take 10 mg by mouth daily.   Yes [provider]  amLODipine (NORVASC) 5 MG tablet Take 5 mg by mouth daily.     [provider]  anastrozole (ARIMIDEX) 1 MG tablet Take 1 tablet (1 mg total) by mouth daily. 05/29/19   [name], MD  atorvastatin (LIPITOR) 10 MG tablet Take 10 mg by mouth every evening.     [provider]  cephALEXin (KEFLEX) 500 MG capsule Take 1 capsule (500 mg total) by mouth 2 (two) times daily for 5 days. 07/13/19 07/18/19  Hall-Potvin, [place], PA-C  cloNIDine (CATAPRES) 0.1 MG tablet Take 1 tablet by mouth twice daily 04/22/19   [name], MD  clopidogrel (PLAVIX) 75 MG tablet Take 75 mg by mouth daily.    [provider]  Coenzyme Q10 300 MG CAPS Take 300 mg by mouth 2 (two) times daily.    [provider]  diphenhydramine-acetaminophen (TYLENOL PM) 25-500 MG TABS tablet Take 1 tablet by mouth at bedtime as needed.    [provider]  hydrochlorothiazide (HYDRODIURIL) 25 MG tablet Take 1 tablet by mouth once daily 06/29/19   Burtis Junes, NP  LANTUS SOLOSTAR 100 UNIT/ML Solostar Pen Inject 40 Units into the skin at bedtime. 05/23/18   [name], MD  levothyroxine (SYNTHROID, LEVOTHROID) 75 MCG tablet Take 75 mcg by mouth daily before breakfast.     [provider]  loratadine  (CLARITIN) 10 MG tablet Take 1 tablet (10 mg total) by mouth daily. 11/20/17   [name], MD  losartan (COZAAR) 100 MG tablet Take 1 tablet by mouth once daily 06/29/19   Burtis Junes, NP  metoprolol (LOPRESSOR) 50 MG tablet Take 1 tablet (50 mg total) by mouth 2 (two) times daily. 09/08/14   [name], MD  nitroGLYCERIN (NITROSTAT) 0.4 MG SL tablet Place 1 tablet (0.4 mg total) under the tongue every 5 (five) minutes as needed for chest pain. 08/29/18 11/27/18  [name], MD  pantoprazole (PROTONIX) 40 MG tablet Take 1 tablet (40 mg total) by mouth daily before breakfast. 09/08/14   [name], MD  phenazopyridine (PYRIDIUM) 100 MG tablet Take 1 tablet (100 mg total) by mouth 3 (  three) times daily as needed for pain. 07/13/19   Hall-Potvin, [place], PA-C  potassium chloride SA (K-DUR,KLOR-CON) 20 MEQ tablet Take 20 mEq by mouth daily. 12/02/14   [provider]  sitaGLIPtin-metformin (JANUMET) 50-1000 MG tablet Take 1 tablet by mouth 2 (two) times daily with a meal.    [provider]    Family History Family History  Problem Relation Age of Onset  . Heart attack Mother   . Hypertension Mother   . Diabetes Mother   . Lung cancer Brother        d.57 from metastatic disease. History of smoking.  Marland Kitchen Healthy Brother   . Breast cancer Cousin 30       d.30s  . Hodgkin's lymphoma Maternal Uncle        d.62s  . Ulcerative colitis Daughter     Social History Social History   Tobacco Use  . Smoking status: Never Smoker  . Smokeless tobacco: Never Used  Substance Use Topics  . Alcohol use: No  . Drug use: No     Allergies   Asa [aspirin], Compazine [prochlorperazine edisylate], Nsaids, Shellfish allergy, Sulfa antibiotics, Zantac [ranitidine hcl], and Lisinopril   Review of Systems Review of Systems  Constitutional: Negative for fatigue and fever.  Respiratory: Negative for cough and shortness of breath.   Cardiovascular: Negative for chest pain  and palpitations.  Gastrointestinal: Negative for abdominal pain, constipation and diarrhea.  Genitourinary: Positive for dysuria and frequency. Negative for flank pain, hematuria, pelvic pain, urgency, vaginal bleeding, vaginal discharge and vaginal pain.     Physical Exam Triage Vital Signs ED Triage Vitals  Enc Vitals Group     BP 07/13/19 1337 (!) 162/79     Pulse Rate 07/13/19 1337 81     Resp 07/13/19 1337 16     Temp 07/13/19 1337 98.1 F (36.7 C)     Temp Source 07/13/19 1337 Temporal     SpO2 07/13/19 1337 98 %     Weight --      Height --      Head Circumference --      Peak Flow --      Pain Score 07/13/19 1338 0     Pain Loc --      Pain Edu? --      Excl. in [place]? --    No data found.  Updated Vital Signs BP (!) 162/79 (BP Location: Right Arm)   Pulse 81   Temp 98.1 F (36.7 C) (Temporal)   Resp 16   SpO2 98%   Visual Acuity Right Eye Distance:   Left Eye Distance:   Bilateral Distance:    Right Eye Near:   Left Eye Near:    Bilateral Near:     Physical Exam Constitutional:      General: She is not in acute distress. HENT:     Head: Normocephalic and atraumatic.  Eyes:     General: No scleral icterus.    Pupils: Pupils are equal, round, and reactive to light.  Cardiovascular:     Rate and Rhythm: Normal rate.  Pulmonary:     Effort: Pulmonary effort is normal.  Abdominal:     General: Bowel sounds are normal.     Palpations: Abdomen is soft.     Tenderness: There is no abdominal tenderness. There is no right CVA tenderness, left CVA tenderness or guarding.  Skin:    Coloration: Skin is not jaundiced or pale.  Neurological:  Mental Status: She is alert and oriented to person, place, and time.      UC Treatments / Results  Labs (all labs ordered are listed, but only abnormal results are displayed) Labs Reviewed  POCT URINALYSIS DIP (MANUAL ENTRY) - Abnormal; Notable for the following components:      Result Value   Glucose, UA  =100 (*)    Blood, UA trace-intact (*)    Leukocytes, UA Small (1+) (*)    All other components within normal limits  URINE CULTURE    EKG   Radiology No results found.  Procedures Procedures (including critical care time)  Medications Ordered in UC Medications - No data to display  Initial Impression / Assessment and Plan / UC Course  I have reviewed the triage vital signs and the nursing notes.  Pertinent labs & imaging results that were available during my care of the patient were reviewed by me and considered in my medical decision making (see chart for details).     POC urine dipstick done office, reviewed by me: Positive for WBC, RBC, glucose.  Culture pending.  Will start Keflex today.  Patient requesting Azo as this typically helps with symptoms.  Return precautions discussed, patient verbalized understanding and is agreeable to plan. Final Clinical Impressions(s) / UC Diagnoses   Final diagnoses:  Acute cystitis with hematuria     Discharge Instructions     Take antibiotic as directed.  May use AZO as needed for symptom relief.    ED Prescriptions    Medication Sig Dispense Auth. Provider   cephALEXin (KEFLEX) 500 MG capsule Take 1 capsule (500 mg total) by mouth 2 (two) times daily for 5 days. 10 capsule Hall-Potvin, [place], PA-C   phenazopyridine (PYRIDIUM) 100 MG tablet Take 1 tablet (100 mg total) by mouth 3 (three) times daily as needed for pain. 10 tablet Hall-Potvin, [place], PA-C     PDMP not reviewed this encounter.   Hall-Potvin, [place], [place] 07/13/19 1407

## 2019-07-14 DIAGNOSIS — H524 Presbyopia: Secondary | ICD-10-CM | POA: Diagnosis not present

## 2019-07-15 LAB — URINE CULTURE: Culture: >=100000 — AB

## 2019-07-16 ENCOUNTER — Telehealth: Payer: Self-pay | Admitting: Emergency Medicine

## 2019-07-16 NOTE — Telephone Encounter (Signed)
Urine culture was positive for e coli  and was given keflex at urgent care visit. Attempted to reach patient to see how she was feeling, no answer.

## 2019-08-26 ENCOUNTER — Ambulatory Visit: Payer: Medicare Other | Attending: Internal Medicine

## 2019-08-26 DIAGNOSIS — Z23 Encounter for immunization: Secondary | ICD-10-CM | POA: Insufficient documentation

## 2019-08-26 NOTE — Progress Notes (Signed)
   Covid-19 Vaccination Clinic  Name:  Susan Barnett    MRN: [id number] DOB: Dec 29, 1945  08/26/2019  [name] was observed post Covid-19 immunization for 30 minutes based on pre-vaccination screening without incidence. She was provided with Vaccine Information Sheet and instruction to access the V-Safe system.   [name] was instructed to call 911 with any severe reactions post vaccine: Marland Kitchen Difficulty breathing  . Swelling of your face and throat  . A fast heartbeat  . A bad rash all over your body  . Dizziness and weakness    Immunizations Administered    Name Date Dose VIS Date Route   Pfizer COVID-19 Vaccine 08/26/2019 11:25 AM 0.3 mL 07/24/2019 Intramuscular   Manufacturer: [place]   Lot: [id number]   [place]: [id number]

## 2019-09-16 ENCOUNTER — Ambulatory Visit: Payer: Medicare Other | Attending: Internal Medicine

## 2019-09-16 DIAGNOSIS — Z23 Encounter for immunization: Secondary | ICD-10-CM | POA: Insufficient documentation

## 2019-09-16 NOTE — Progress Notes (Signed)
   Covid-19 Vaccination Clinic  Name:  Susan Barnett    MRN: [id number] DOB: 15-Nov-1945  09/16/2019  Susan Barnett was observed post Covid-19 immunization for 15 minutes without incidence. She was provided with Vaccine Information Sheet and instruction to access the V-Safe system.   Susan Barnett was instructed to call 911 with any severe reactions post vaccine: Marland Kitchen Difficulty breathing  . Swelling of your face and throat  . A fast heartbeat  . A bad rash all over your body  . Dizziness and weakness    Immunizations Administered    Name Date Dose VIS Date Route   Pfizer COVID-19 Vaccine 09/16/2019 10:15 AM 0.3 mL 07/24/2019 Intramuscular   Manufacturer: [place]   Lot: [id number]   [place]: [id number]

## 2019-09-26 ENCOUNTER — Other Ambulatory Visit: Payer: Self-pay | Admitting: Nurse Practitioner

## 2019-09-28 ENCOUNTER — Other Ambulatory Visit: Payer: Self-pay | Admitting: Interventional Cardiology

## 2019-09-28 MED ORDER — LOSARTAN POTASSIUM 100 MG PO TABS: 100.0000 mg | ORAL_TABLET | Freq: Every day | ORAL | 0 refills | Status: DC

## 2019-10-14 ENCOUNTER — Telehealth: Payer: Self-pay | Admitting: Interventional Cardiology

## 2019-10-14 MED ORDER — LOSARTAN POTASSIUM 100 MG PO TABS: 100.0000 mg | ORAL_TABLET | Freq: Every day | ORAL | 0 refills | Status: DC

## 2019-10-14 MED ORDER — HYDROCHLOROTHIAZIDE 25 MG PO TABS: 25.0000 mg | ORAL_TABLET | Freq: Every day | ORAL | 0 refills | Status: DC

## 2019-10-14 NOTE — Telephone Encounter (Signed)
*  STAT* If patient is at the pharmacy, call can be transferred to refill team.   1. Which medications need to be refilled? (please list name of each medication and dose if known)  losartan (COZAAR) 100 MG tablet hydrochlorothiazide (HYDRODIURIL) 25 MG tablet  2. Which pharmacy/location (including street and city if local pharmacy) is medication to be sent to? [facility], [address]  3. Do they need a 30 day or 90 day supply? 90 day supply

## 2019-10-14 NOTE — Telephone Encounter (Signed)
**Note De-Identified Susan Barnett Obfuscation** I have e-scribed the pts HCTZ and Losartan Rxs to [facility] to fill per request. The pts is overdue for f/u so #15 with no refills granted on each.

## 2019-10-18 NOTE — Progress Notes (Signed)
Cardiology Office Note:    Date:  10/19/2019   ID:  Susan Barnett, DOB [birth year], MRN [id number]  PCP:  Shirline Frees, MD  Cardiologist:  Sinclair Grooms, MD   Referring MD: Shirline Frees, MD   Chief Complaint  Patient presents with  . Congestive Heart Failure  . Shortness of Breath    History of Present Illness:    Susan Barnett is a 74 y.o. female with a hx of CAD, recent CABG for repeat in-stent restenosis of right coronary, hypertension, diabetes mellitus, hyperlipidemia, and obstructive sleep apnea.  She complains of shortness of breath on exertion.  She denies orthopnea and PND.  No anginal complaints or nitroglycerin use.  She has also been concerned about sporadic elevations in blood pressure with systolic greater than 022 mmHg as it is today.  She has had some trace lower extremity edema.  Past Medical History:  Diagnosis Date  . Anemia    mild  . Anginal pain ([place])    with exertion, last time prior to cardiac caht 06/02/14  . Anxiety    situational  . Arthritis   . Breast cancer ([place]) 2018   Left Breast Cancer  . CAD (coronary artery disease)    Dr. Linard Millers follows, no problems now  . Cancer [facility])    Breast cancer  . Complication of anesthesia   . Diabetes mellitus, type 2 ([place])    TYPE 2  . Dysrhythmia    occ skips a beat, rate was fast before beta blocker.  . Gastric polyp   . Genetic testing 04/12/2017   Ms. Delmont underwent genetic counseling and testing for hereditary cancer syndromes on 02/19/2017. Her results were negative for mutations in all 46 genes analyzed by [facility]'s 46-gene Common Hereditary Cancers Panel. Genes analyzed include: APC, ATM, AXIN2, BARD1, BMPR1A, BRCA1, BRCA2, BRIP1, CDH1, CDKN2A, CHEK2, CTNNA1, DICER1, EPCAM, GREM1, HOXB13, KIT, MEN1, MLH1, MSH2, MSH3, MSH6, MUTYH, NBN  . GERD (gastroesophageal reflux disease)   . Headache    hx of  . Heartburn   . Hiatal hernia    mild head tremors  . History of radiation  therapy 01/30/17- 02/21/17   Left Breast 42.56 Gy in 16 fractions  . Hypertension   . Hypothyroidism   . Occasional tremors    "of head", slight hands  . Osteopenia   . Personal history of radiation therapy 2018   Left Breast Cancer  . Pneumonia   . PONV (postoperative nausea and vomiting) 02/24/14  . Shortness of breath    with exertion  . Sleep apnea    cpap used nightly x 2 yrs now.    Past Surgical History:  Procedure Laterality Date  . BREAST BIOPSY Left 1987  . BREAST LUMPECTOMY Left 01/25/2017  . BREAST LUMPECTOMY WITH RADIOACTIVE SEED AND SENTINEL LYMPH NODE BIOPSY Left 12/25/2016   Procedure: BREAST LUMPECTOMY WITH RADIOACTIVE SEED AND SENTINEL LYMPH NODE BIOPSY;  Surgeon: [name], MD;  Location: [place];  Service: General;  Laterality: Left;  . CARDIAC CATHETERIZATION  06/02/2014   LAD 30%, D1 80%, CFX 755, RCA > 95% ISR, rx w/ PTCA, heavy calcification, EF 65%  . CORONARY ARTERY BYPASS GRAFT N/A 06/14/2014   Procedure: CORONARY ARTERY BYPASS GRAFTING (CABG);  Surgeon: Gaye Pollack, MD;  Location: [place];  Service: Open Heart Surgery;  Laterality: N/A;  Times 2 using endoscopically harvested right saphenous vein.  . coronary stents     x3 stents placed-prior ablation  . DILATION  AND CURETTAGE OF UTERUS    . ELBOW FRACTURE SURGERY Left   . INCONTINENCE SURGERY     sling  . KNEE ARTHROSCOPY Right 02/24/2014   Procedure: RIGHT KNEE ARTHROSCOPY WITH DEBRIDEMENT ;  Surgeon: Gearlean Alf, MD;  Location: [facility];  Service: Orthopedics;  Laterality: Right;  . LEFT HEART CATHETERIZATION WITH CORONARY ANGIOGRAM N/A 06/02/2014   Procedure: LEFT HEART CATHETERIZATION WITH CORONARY ANGIOGRAM;  Surgeon: Sinclair Grooms, MD;  Location: [facility] CATH LAB;  Service: Cardiovascular;  Laterality: N/A;  . TEE WITHOUT CARDIOVERSION N/A 06/14/2014   Procedure: TRANSESOPHAGEAL ECHOCARDIOGRAM (TEE);  Surgeon: Gaye Pollack, MD;  Location: [place];  Service: Open Heart Surgery;  Laterality:  N/A;    Current Medications: Current Meds  Medication Sig  . amLODipine (NORVASC) 5 MG tablet Take 5 mg by mouth daily.   Marland Kitchen anastrozole (ARIMIDEX) 1 MG tablet Take 1 tablet (1 mg total) by mouth daily.  Marland Kitchen atorvastatin (LIPITOR) 10 MG tablet Take 10 mg by mouth every evening.   . cetirizine (ZYRTEC) 10 MG tablet Take 10 mg by mouth daily.  . cloNIDine (CATAPRES) 0.1 MG tablet Take 1 tablet by mouth twice daily  . clopidogrel (PLAVIX) 75 MG tablet Take 75 mg by mouth daily.  . Coenzyme Q10 300 MG CAPS Take 300 mg by mouth 2 (two) times daily.  . diphenhydramine-acetaminophen (TYLENOL PM) 25-500 MG TABS tablet Take 1 tablet by mouth at bedtime as needed.  . hydrochlorothiazide (HYDRODIURIL) 25 MG tablet Take 1 tablet (25 mg total) by mouth daily.  Marland Kitchen LANTUS SOLOSTAR 100 UNIT/ML Solostar Pen Inject 40 Units into the skin at bedtime.  Marland Kitchen levothyroxine (SYNTHROID, LEVOTHROID) 75 MCG tablet Take 75 mcg by mouth daily before breakfast.   . losartan (COZAAR) 100 MG tablet Take 1 tablet (100 mg total) by mouth daily. Please make overdue appt with Dr. Tamala Julian before anymore refills. 2nd attempt  . metoprolol (LOPRESSOR) 50 MG tablet Take 1 tablet (50 mg total) by mouth 2 (two) times daily.  . nitroGLYCERIN (NITROSTAT) 0.4 MG SL tablet Place 1 tablet (0.4 mg total) under the tongue every 5 (five) minutes as needed for chest pain.  . pantoprazole (PROTONIX) 40 MG tablet Take 1 tablet (40 mg total) by mouth daily before breakfast.  . sitaGLIPtin-metformin (JANUMET) 50-1000 MG tablet Take 1 tablet by mouth 2 (two) times daily with a meal.  . [DISCONTINUED] potassium chloride SA (K-DUR,KLOR-CON) 20 MEQ tablet Take 20 mEq by mouth daily.   Current Facility-Administered Medications for the 10/19/19 encounter (Office Visit) with [name], MD  Medication  . 0.9 %  sodium chloride infusion     Allergies:   Asa [aspirin], Compazine [prochlorperazine edisylate], Nsaids, Shellfish allergy, Sulfa antibiotics,  Zantac [ranitidine hcl], and Lisinopril   Social History   Socioeconomic History  . Marital status: Married    Spouse name: Not on file  . Number of children: Not on file  . Years of education: Not on file  . Highest education level: Not on file  Occupational History  . Not on file  Tobacco Use  . Smoking status: Never Smoker  . Smokeless tobacco: Never Used  Substance and Sexual Activity  . Alcohol use: No  . Drug use: No  . Sexual activity: Not Currently  Other Topics Concern  . Not on file  Social History Narrative  . Not on file   Social Determinants of Health   Financial Resource Strain:   . Difficulty of Paying Living  Expenses: Not on file  Food Insecurity:   . Worried About Charity fundraiser in the Last Year: Not on file  . Ran Out of Food in the Last Year: Not on file  Transportation Needs:   . Lack of Transportation (Medical): Not on file  . Lack of Transportation (Non-Medical): Not on file  Physical Activity:   . Days of Exercise per Week: Not on file  . Minutes of Exercise per Session: Not on file  Stress:   . Feeling of Stress : Not on file  Social Connections:   . Frequency of Communication with Friends and Family: Not on file  . Frequency of Social Gatherings with Friends and Family: Not on file  . Attends Religious Services: Not on file  . Active Member of Clubs or Organizations: Not on file  . Attends Archivist Meetings: Not on file  . Marital Status: Not on file     Family History: The patient's family history includes Breast cancer (age of onset: 49) in her cousin; Diabetes in her mother; Healthy in her brother; Heart attack in her mother; Hodgkin's lymphoma in her maternal uncle; Hypertension in her mother; Lung cancer in her brother; Ulcerative colitis in her daughter.  ROS:   Please see the history of present illness.    She fell recently after losing balance while bending over.  She banged her lower rib cage although it is  feeling much better now.  She denies shortness of breath.  She has tried SGLT2 therapy in the past but was complicated by yeast infections.  She is not wearing her CPAP since May because it causes her eyes to dry.  All other systems reviewed and are negative.  EKGs/Labs/Other Studies Reviewed:    The following studies were reviewed today: No recent data  EKG:  EKG normal sinus rhythm.  Poor R wave progression.  Recent Labs: 05/28/2019: Hemoglobin 13.0; Platelet Count 287  Recent Lipid Panel    Component Value Date/Time   CHOL  08/13/2007 0615    116        ATP III CLASSIFICATION:  <200     mg/dL   Desirable  200-239  mg/dL   Borderline High  >=240    mg/dL   High   TRIG 69 08/13/2007 0615   HDL 41 08/13/2007 0615   CHOLHDL 2.8 08/13/2007 0615   VLDL 14 08/13/2007 0615   LDLCALC  08/13/2007 0615    61        Total Cholesterol/HDL:CHD Risk Coronary Heart Disease Risk Table                     Men   Women  1/2 Average Risk   3.4   3.3    Physical Exam:    VS:  BP (!) 144/76   Pulse 68   Ht 5' 2"  (1.575 m)   Wt 165 lb 3.2 oz (74.9 kg)   SpO2 98%   BMI 30.22 kg/m     Wt Readings from Last 3 Encounters:  10/19/19 165 lb 3.2 oz (74.9 kg)  05/29/19 163 lb 6.4 oz (74.1 kg)  09/18/18 166 lb 12.8 oz (75.7 kg)     GEN: Moderate obesity. No acute distress HEENT: Normal NECK: No JVD. LYMPHATICS: No lymphadenopathy CARDIAC:  RRR without murmur, gallop, with trace bilateral ankle edema. VASCULAR:  Normal Pulses. No bruits. RESPIRATORY:  Clear to auscultation without rales, wheezing or rhonchi  ABDOMEN: Soft, non-tender, non-distended, No pulsatile  mass, MUSCULOSKELETAL: No deformity  SKIN: Warm and dry NEUROLOGIC:  Alert and oriented x 3 PSYCHIATRIC:  Normal affect   ASSESSMENT:    1. Coronary artery disease involving coronary bypass graft of native heart with angina pectoris ([place])   2. Essential hypertension   3. Mixed hyperlipidemia   4. OSA (obstructive sleep  apnea)   5. Type 2 diabetes mellitus with complications ([place])   6. Educated about COVID-19 virus infection    PLAN:    In order of problems listed above:  1. Secondary prevention discussed.  She has been unable to tolerate SGLT2 therapy.  She needs to achieve 150 minutes of moderate activity per week. 2. Blood pressure tends to run above 140 mmHg.  Plan to add spironolactone 12.5 mg/day.  Basic metabolic panel in 1 week to check potassium.  May be able to discontinue K. Dur.  Will decrease K-Dur to 10 mEq in the meantime. 3. Target LDL less than 70.  Continue low intensity statin therapy, Lipitor 10 mg/day.  Last LDL was 55. 4. She is not wearing CPAP.  Her mask does not fit well and causes dry eyes.  She needs to get back with her sleep physician to figure out a mechanism to improve compliance. 5. SGLT2 therapy was discussed.  She has tried [place] and had recurrent yeast infections.  This is therefore off the table. 6. She has gotten both doses of Pfizer.  Still practicing social distancing and handwashing.  Overall education and awareness concerning primary/secondary risk prevention was discussed in detail: LDL less than 70, hemoglobin A1c less than 7, blood pressure target less than 130/80 mmHg, >150 minutes of moderate aerobic activity per week, avoidance of smoking, weight control (via diet and exercise), and continued surveillance/management of/for obstructive sleep apnea.    Medication Adjustments/Labs and Tests Ordered: Current medicines are reviewed at length with the patient today.  Concerns regarding medicines are outlined above.  Orders Placed This Encounter  Procedures  . Basic metabolic panel   Meds ordered this encounter  Medications  . spironolactone (ALDACTONE) 25 MG tablet    Sig: Take 0.5 tablets (12.5 mg total) by mouth daily.    Dispense:  45 tablet    Refill:  3  . potassium chloride (KLOR-CON) 10 MEQ tablet    Sig: Take 1 tablet (10 mEq total) by mouth daily.     Dispense:  90 tablet    Refill:  3    Dose change    Patient Instructions  Medication Instructions:  1) START Spironolactone 12.8m once daily 2) DECREASE Potassium to 114m once daily  *If you need a refill on your cardiac medications before your next appointment, please call your pharmacy*   Lab Work: BMET in 7-10 days  If you have labs (blood work) drawn today and your tests are completely normal, you will receive your results only by: . Marland KitchenyChart Message (if you have MyChart) OR . A paper copy in the mail If you have any lab test that is abnormal or we need to change your treatment, we will call you to review the results.   Testing/Procedures: None   Follow-Up: [facility]  As part of our continuing mission to provide you with exceptional heart care, we have created designated Provider Care Teams.  These Care Teams include your primary Cardiologist (physician) and Advanced Practice Providers (APPs -  Physician Assistants and Nurse Practitioners) who all work together to provide you with  the care you need, when you need it.  We recommend signing up for the patient portal called "MyChart".  Sign up information is provided on this After Visit Summary.  MyChart is used to connect with patients for Virtual Visits (Telemedicine).  Patients are able to view lab/test results, encounter notes, upcoming appointments, etc.  Non-urgent messages can be sent to your provider as well.   To learn more about what you can do with MyChart, go to [email or web address].    Your next appointment:   1 year(s)  The format for your next appointment:   In Person  Provider:   You may see Sinclair Grooms, MD or one of the following Advanced Practice Providers on your designated Care Team:    Truitt Merle, NP  Cecilie Kicks, NP  Kathyrn Drown, NP    Other Instructions  Your provider recommends that you maintain 150 minutes per week of  moderate aerobic activity.      Signed, Sinclair Grooms, MD  10/19/2019 10:53 AM    [place]

## 2019-10-19 ENCOUNTER — Ambulatory Visit: Payer: Medicare Other | Admitting: Interventional Cardiology

## 2019-10-19 ENCOUNTER — Encounter: Payer: Self-pay | Admitting: Interventional Cardiology

## 2019-10-19 ENCOUNTER — Other Ambulatory Visit: Payer: Self-pay

## 2019-10-19 VITALS — BP 144/76 | HR 68 | Ht 62.0 in | Wt 165.2 lb

## 2019-10-19 DIAGNOSIS — G4733 Obstructive sleep apnea (adult) (pediatric): Secondary | ICD-10-CM

## 2019-10-19 DIAGNOSIS — I25709 Atherosclerosis of coronary artery bypass graft(s), unspecified, with unspecified angina pectoris: Secondary | ICD-10-CM

## 2019-10-19 DIAGNOSIS — I10 Essential (primary) hypertension: Secondary | ICD-10-CM | POA: Diagnosis not present

## 2019-10-19 DIAGNOSIS — E782 Mixed hyperlipidemia: Secondary | ICD-10-CM

## 2019-10-19 DIAGNOSIS — E118 Type 2 diabetes mellitus with unspecified complications: Secondary | ICD-10-CM

## 2019-10-19 DIAGNOSIS — Z7189 Other specified counseling: Secondary | ICD-10-CM

## 2019-10-19 MED ORDER — SPIRONOLACTONE 25 MG PO TABS: 12.5000 mg | ORAL_TABLET | Freq: Every day | ORAL | 3 refills | Status: DC

## 2019-10-19 MED ORDER — POTASSIUM CHLORIDE ER 10 MEQ PO TBCR: 10.0000 meq | EXTENDED_RELEASE_TABLET | Freq: Every day | ORAL | 3 refills | Status: DC

## 2019-10-19 NOTE — Patient Instructions (Signed)
Medication Instructions:  1) START Spironolactone 12.5mg  once daily 2) DECREASE Potassium to 73meq once daily  *If you need a refill on your cardiac medications before your next appointment, please call your pharmacy*   Lab Work: BMET in 7-10 days  If you have labs (blood work) drawn today and your tests are completely normal, you will receive your results only by: Marland Kitchen MyChart Message (if you have MyChart) OR . A paper copy in the mail If you have any lab test that is abnormal or we need to change your treatment, we will call you to review the results.   Testing/Procedures: None   Follow-Up: At [facility], you and your health needs are our priority.  As part of our continuing mission to provide you with exceptional heart care, we have created designated Provider Care Teams.  These Care Teams include your primary Cardiologist (physician) and Advanced Practice Providers (APPs -  Physician Assistants and Nurse Practitioners) who all work together to provide you with the care you need, when you need it.  We recommend signing up for the patient portal called "MyChart".  Sign up information is provided on this After Visit Summary.  MyChart is used to connect with patients for Virtual Visits (Telemedicine).  Patients are able to view lab/test results, encounter notes, upcoming appointments, etc.  Non-urgent messages can be sent to your provider as well.   To learn more about what you can do with MyChart, go to [email or web address].    Your next appointment:   1 year(s)  The format for your next appointment:   In Person  Provider:   You may see Sinclair Grooms, MD or one of the following Advanced Practice Providers on your designated Care Team:    Truitt Merle, NP  Cecilie Kicks, NP  Kathyrn Drown, NP    Other Instructions  Your provider recommends that you maintain 150 minutes per week of moderate aerobic activity.

## 2019-10-20 NOTE — Addendum Note (Signed)
Addended by: Carylon Perches on: 10/20/2019 03:17 PM   Modules accepted: Orders

## 2019-10-28 ENCOUNTER — Other Ambulatory Visit: Payer: Medicare Other

## 2019-10-28 ENCOUNTER — Telehealth: Payer: Self-pay | Admitting: Interventional Cardiology

## 2019-10-28 MED ORDER — LOSARTAN POTASSIUM 100 MG PO TABS: 100.0000 mg | ORAL_TABLET | Freq: Every day | ORAL | 3 refills | Status: DC

## 2019-10-28 NOTE — Telephone Encounter (Signed)
Okay to try that

## 2019-10-28 NOTE — Telephone Encounter (Signed)
Pt c/o medication issue:  1. Name of Medication: spironolactone (ALDACTONE) 25 MG tablet   2. How are you currently taking this medication (dosage and times per day)? As directed  3. Are you having a reaction (difficulty breathing--STAT)? no  4. What is your medication issue? Patient is having a hard time splitting this pill in half. She states she has a pill cutter but she cant cut it directly in half.

## 2019-10-28 NOTE — Telephone Encounter (Signed)
Pt's medication was sent to pt's pharmacy as requested. Confirmation received.  °

## 2019-10-28 NOTE — Telephone Encounter (Signed)
*  STAT* If patient is at the pharmacy, call can be transferred to refill team.   1. Which medications need to be refilled? (please list name of each medication and dose if known) losartan (COZAAR) 100 MG tablet  2. Which pharmacy/location (including street and city if local pharmacy) is medication to be sent to? [place], [address]  3. Do they need a 30 day or 90 day supply? 90   Patient got a 15 day supply but she had an appt with Dr. Tamala Julian on 10/19/19

## 2019-10-28 NOTE — Telephone Encounter (Signed)
Pt having a hard time splitting the pills evenly.  States they are slippery and keep shooting off the pill cutter.  Advised to try taking the whole tablet every other day and to monitor blood pressure. Will route to Dr. Tamala Julian to see if this is ok.

## 2019-10-29 ENCOUNTER — Other Ambulatory Visit: Payer: Medicare Other

## 2019-11-03 DIAGNOSIS — I25810 Atherosclerosis of coronary artery bypass graft(s) without angina pectoris: Secondary | ICD-10-CM | POA: Diagnosis not present

## 2019-11-03 DIAGNOSIS — E1159 Type 2 diabetes mellitus with other circulatory complications: Secondary | ICD-10-CM | POA: Diagnosis not present

## 2019-11-03 DIAGNOSIS — C50919 Malignant neoplasm of unspecified site of unspecified female breast: Secondary | ICD-10-CM | POA: Diagnosis not present

## 2019-11-03 DIAGNOSIS — M8580 Other specified disorders of bone density and structure, unspecified site: Secondary | ICD-10-CM | POA: Diagnosis not present

## 2019-11-05 ENCOUNTER — Other Ambulatory Visit: Payer: Medicare Other | Admitting: *Deleted

## 2019-11-05 ENCOUNTER — Other Ambulatory Visit: Payer: Self-pay

## 2019-11-05 DIAGNOSIS — I10 Essential (primary) hypertension: Secondary | ICD-10-CM

## 2019-11-06 LAB — BASIC METABOLIC PANEL
BUN/Creatinine Ratio: 21 (ref 12–28)
BUN: 15 mg/dL (ref 8–27)
CO2: 24 mmol/L (ref 20–29)
Calcium: 9.6 mg/dL (ref 8.7–10.3)
Chloride: 92 mmol/L — ABNORMAL LOW (ref 96–106)
Creatinine, Ser: 0.72 mg/dL (ref 0.57–1.00)
GFR calc Af Amer: 95 mL/min/{1.73_m2} (ref >59)
GFR calc non Af Amer: 83 mL/min/{1.73_m2} (ref >59)
Glucose: 258 mg/dL — ABNORMAL HIGH (ref 65–99)
Potassium: 4.6 mmol/L (ref 3.5–5.2)
Sodium: 130 mmol/L — ABNORMAL LOW (ref 134–144)

## 2019-11-11 ENCOUNTER — Other Ambulatory Visit: Payer: Self-pay

## 2019-11-11 DIAGNOSIS — Z0120 Encounter for dental examination and cleaning without abnormal findings: Secondary | ICD-10-CM | POA: Diagnosis not present

## 2019-11-11 MED ORDER — HYDROCHLOROTHIAZIDE 25 MG PO TABS: 25.0000 mg | ORAL_TABLET | Freq: Every day | ORAL | 3 refills | Status: DC

## 2019-12-17 DIAGNOSIS — M25511 Pain in right shoulder: Secondary | ICD-10-CM | POA: Diagnosis not present

## 2019-12-17 DIAGNOSIS — M545 Low back pain: Secondary | ICD-10-CM | POA: Diagnosis not present

## 2019-12-18 ENCOUNTER — Emergency Department (HOSPITAL_COMMUNITY): Payer: Medicare Other

## 2019-12-18 ENCOUNTER — Inpatient Hospital Stay (HOSPITAL_COMMUNITY)
Admission: EM | Admit: 2019-12-18 | Discharge: 2019-12-21 | DRG: 641 | Disposition: A | Discharge status: Home Health | Payer: Medicare Other | Attending: Internal Medicine | Admitting: Internal Medicine

## 2019-12-18 ENCOUNTER — Other Ambulatory Visit: Payer: Self-pay

## 2019-12-18 ENCOUNTER — Encounter (HOSPITAL_COMMUNITY): Payer: Self-pay

## 2019-12-18 DIAGNOSIS — W010XXA Fall on same level from slipping, tripping and stumbling without subsequent striking against object, initial encounter: Secondary | ICD-10-CM | POA: Diagnosis present

## 2019-12-18 DIAGNOSIS — Z79899 Other long term (current) drug therapy: Secondary | ICD-10-CM

## 2019-12-18 DIAGNOSIS — Z794 Long term (current) use of insulin: Secondary | ICD-10-CM

## 2019-12-18 DIAGNOSIS — Z833 Family history of diabetes mellitus: Secondary | ICD-10-CM

## 2019-12-18 DIAGNOSIS — E785 Hyperlipidemia, unspecified: Secondary | ICD-10-CM | POA: Diagnosis not present

## 2019-12-18 DIAGNOSIS — I10 Essential (primary) hypertension: Secondary | ICD-10-CM | POA: Diagnosis not present

## 2019-12-18 DIAGNOSIS — M25531 Pain in right wrist: Secondary | ICD-10-CM | POA: Diagnosis not present

## 2019-12-18 DIAGNOSIS — Z9181 History of falling: Secondary | ICD-10-CM | POA: Diagnosis not present

## 2019-12-18 DIAGNOSIS — E871 Hypo-osmolality and hyponatremia: Secondary | ICD-10-CM | POA: Diagnosis not present

## 2019-12-18 DIAGNOSIS — K219 Gastro-esophageal reflux disease without esophagitis: Secondary | ICD-10-CM | POA: Diagnosis not present

## 2019-12-18 DIAGNOSIS — R52 Pain, unspecified: Secondary | ICD-10-CM

## 2019-12-18 DIAGNOSIS — S0990XA Unspecified injury of head, initial encounter: Secondary | ICD-10-CM | POA: Diagnosis not present

## 2019-12-18 DIAGNOSIS — Z79890 Hormone replacement therapy (postmenopausal): Secondary | ICD-10-CM | POA: Diagnosis not present

## 2019-12-18 DIAGNOSIS — M25511 Pain in right shoulder: Secondary | ICD-10-CM | POA: Diagnosis not present

## 2019-12-18 DIAGNOSIS — Z79811 Long term (current) use of aromatase inhibitors: Secondary | ICD-10-CM | POA: Diagnosis not present

## 2019-12-18 DIAGNOSIS — Z8249 Family history of ischemic heart disease and other diseases of the circulatory system: Secondary | ICD-10-CM

## 2019-12-18 DIAGNOSIS — Z803 Family history of malignant neoplasm of breast: Secondary | ICD-10-CM

## 2019-12-18 DIAGNOSIS — E039 Hypothyroidism, unspecified: Secondary | ICD-10-CM | POA: Diagnosis not present

## 2019-12-18 DIAGNOSIS — C50912 Malignant neoplasm of unspecified site of left female breast: Secondary | ICD-10-CM | POA: Diagnosis not present

## 2019-12-18 DIAGNOSIS — S0083XA Contusion of other part of head, initial encounter: Secondary | ICD-10-CM | POA: Diagnosis not present

## 2019-12-18 DIAGNOSIS — Z886 Allergy status to analgesic agent status: Secondary | ICD-10-CM

## 2019-12-18 DIAGNOSIS — Z881 Allergy status to other antibiotic agents status: Secondary | ICD-10-CM

## 2019-12-18 DIAGNOSIS — Z7902 Long term (current) use of antithrombotics/antiplatelets: Secondary | ICD-10-CM | POA: Diagnosis not present

## 2019-12-18 DIAGNOSIS — I2510 Atherosclerotic heart disease of native coronary artery without angina pectoris: Secondary | ICD-10-CM | POA: Diagnosis not present

## 2019-12-18 DIAGNOSIS — Z923 Personal history of irradiation: Secondary | ICD-10-CM

## 2019-12-18 DIAGNOSIS — M549 Dorsalgia, unspecified: Secondary | ICD-10-CM | POA: Diagnosis not present

## 2019-12-18 DIAGNOSIS — E1165 Type 2 diabetes mellitus with hyperglycemia: Secondary | ICD-10-CM | POA: Diagnosis present

## 2019-12-18 DIAGNOSIS — Z20822 Contact with and (suspected) exposure to covid-19: Secondary | ICD-10-CM | POA: Diagnosis present

## 2019-12-18 DIAGNOSIS — E119 Type 2 diabetes mellitus without complications: Secondary | ICD-10-CM | POA: Diagnosis not present

## 2019-12-18 DIAGNOSIS — E782 Mixed hyperlipidemia: Secondary | ICD-10-CM | POA: Diagnosis not present

## 2019-12-18 DIAGNOSIS — Z87892 Personal history of anaphylaxis: Secondary | ICD-10-CM | POA: Diagnosis not present

## 2019-12-18 DIAGNOSIS — M8580 Other specified disorders of bone density and structure, unspecified site: Secondary | ICD-10-CM | POA: Diagnosis not present

## 2019-12-18 DIAGNOSIS — Z8701 Personal history of pneumonia (recurrent): Secondary | ICD-10-CM | POA: Diagnosis not present

## 2019-12-18 DIAGNOSIS — S299XXA Unspecified injury of thorax, initial encounter: Secondary | ICD-10-CM | POA: Diagnosis not present

## 2019-12-18 DIAGNOSIS — R531 Weakness: Secondary | ICD-10-CM

## 2019-12-18 DIAGNOSIS — Z951 Presence of aortocoronary bypass graft: Secondary | ICD-10-CM

## 2019-12-18 DIAGNOSIS — R0789 Other chest pain: Secondary | ICD-10-CM | POA: Diagnosis present

## 2019-12-18 DIAGNOSIS — E118 Type 2 diabetes mellitus with unspecified complications: Secondary | ICD-10-CM | POA: Diagnosis present

## 2019-12-18 DIAGNOSIS — Z91013 Allergy to seafood: Secondary | ICD-10-CM

## 2019-12-18 DIAGNOSIS — Z888 Allergy status to other drugs, medicaments and biological substances status: Secondary | ICD-10-CM

## 2019-12-18 DIAGNOSIS — Z955 Presence of coronary angioplasty implant and graft: Secondary | ICD-10-CM

## 2019-12-18 DIAGNOSIS — Z882 Allergy status to sulfonamides status: Secondary | ICD-10-CM

## 2019-12-18 DIAGNOSIS — W19XXXA Unspecified fall, initial encounter: Secondary | ICD-10-CM | POA: Diagnosis not present

## 2019-12-18 DIAGNOSIS — G4733 Obstructive sleep apnea (adult) (pediatric): Secondary | ICD-10-CM | POA: Diagnosis present

## 2019-12-18 DIAGNOSIS — R9431 Abnormal electrocardiogram [ECG] [EKG]: Secondary | ICD-10-CM | POA: Diagnosis not present

## 2019-12-18 DIAGNOSIS — S199XXA Unspecified injury of neck, initial encounter: Secondary | ICD-10-CM | POA: Diagnosis not present

## 2019-12-18 DIAGNOSIS — S6991XA Unspecified injury of right wrist, hand and finger(s), initial encounter: Secondary | ICD-10-CM | POA: Diagnosis not present

## 2019-12-18 DIAGNOSIS — M1990 Unspecified osteoarthritis, unspecified site: Secondary | ICD-10-CM | POA: Diagnosis not present

## 2019-12-18 DIAGNOSIS — R079 Chest pain, unspecified: Secondary | ICD-10-CM | POA: Diagnosis not present

## 2019-12-18 DIAGNOSIS — M5489 Other dorsalgia: Secondary | ICD-10-CM | POA: Diagnosis not present

## 2019-12-18 LAB — BASIC METABOLIC PANEL
Anion gap: 13 (ref 5–15)
BUN: 15 mg/dL (ref 8–23)
CO2: 21 mmol/L — ABNORMAL LOW (ref 22–32)
Calcium: 8.7 mg/dL — ABNORMAL LOW (ref 8.9–10.3)
Chloride: 85 mmol/L — ABNORMAL LOW (ref 98–111)
Creatinine, Ser: 0.63 mg/dL (ref 0.44–1.00)
GFR calc Af Amer: >60 mL/min (ref >60)
GFR calc non Af Amer: >60 mL/min (ref >60)
Glucose, Bld: 256 mg/dL — ABNORMAL HIGH (ref 70–99)
Potassium: 3.7 mmol/L (ref 3.5–5.1)
Sodium: 119 mmol/L — CL (ref 135–145)

## 2019-12-18 LAB — COMPREHENSIVE METABOLIC PANEL
ALT: 22 U/L (ref 0–44)
AST: 23 U/L (ref 15–41)
Albumin: 4.2 g/dL (ref 3.5–5.0)
Alkaline Phosphatase: 63 U/L (ref 38–126)
Anion gap: 12 (ref 5–15)
BUN: 15 mg/dL (ref 8–23)
CO2: 25 mmol/L (ref 22–32)
Calcium: 9.7 mg/dL (ref 8.9–10.3)
Chloride: 86 mmol/L — ABNORMAL LOW (ref 98–111)
Creatinine, Ser: 0.73 mg/dL (ref 0.44–1.00)
GFR calc Af Amer: >60 mL/min (ref >60)
GFR calc non Af Amer: >60 mL/min (ref >60)
Glucose, Bld: 137 mg/dL — ABNORMAL HIGH (ref 70–99)
Potassium: 4.5 mmol/L (ref 3.5–5.1)
Sodium: 123 mmol/L — ABNORMAL LOW (ref 135–145)
Total Bilirubin: 0.6 mg/dL (ref 0.3–1.2)
Total Protein: 7.5 g/dL (ref 6.5–8.1)

## 2019-12-18 LAB — RESPIRATORY PANEL BY RT PCR (FLU A&B, COVID)
Influenza A by PCR: NEGATIVE
Influenza B by PCR: NEGATIVE
SARS Coronavirus 2 by RT PCR: NEGATIVE

## 2019-12-18 LAB — CBC
HCT: 38.9 % (ref 36.0–46.0)
Hemoglobin: 13 g/dL (ref 12.0–15.0)
MCH: 29.5 pg (ref 26.0–34.0)
MCHC: 33.4 g/dL (ref 30.0–36.0)
MCV: 88.4 fL (ref 80.0–100.0)
Platelets: 362 10*3/uL (ref 150–400)
RBC: 4.4 MIL/uL (ref 3.87–5.11)
RDW: 13.1 % (ref 11.5–15.5)
WBC: 15.8 10*3/uL — ABNORMAL HIGH (ref 4.0–10.5)
nRBC: 0 % (ref 0.0–0.2)

## 2019-12-18 LAB — GLUCOSE, CAPILLARY: Glucose-Capillary: 194 mg/dL — ABNORMAL HIGH (ref 70–99)

## 2019-12-18 LAB — TSH: TSH: 2.233 u[IU]/mL (ref 0.350–4.500)

## 2019-12-18 LAB — OSMOLALITY: Osmolality: 264 mOsm/kg — ABNORMAL LOW (ref 275–295)

## 2019-12-18 LAB — HEMOGLOBIN A1C
Hgb A1c MFr Bld: 8.4 % — ABNORMAL HIGH (ref 4.8–5.6)
Mean Plasma Glucose: 194.38 mg/dL

## 2019-12-18 MED ORDER — PANTOPRAZOLE SODIUM 40 MG PO TBEC
40.0000 mg | DELAYED_RELEASE_TABLET | Freq: Every day | ORAL | Status: DC
  Administered 2019-12-19 – 2019-12-21 (×3): 40 mg via ORAL
  Filled 2019-12-18 (×3): qty 1

## 2019-12-18 MED ORDER — INSULIN GLARGINE 100 UNIT/ML ~~LOC~~ SOLN
35.0000 [IU] | Freq: Every day | SUBCUTANEOUS | Status: DC
  Administered 2019-12-18: 35 [IU] via SUBCUTANEOUS
  Filled 2019-12-18 (×4): qty 0.35

## 2019-12-18 MED ORDER — ATORVASTATIN CALCIUM 10 MG PO TABS
10.0000 mg | ORAL_TABLET | Freq: Every evening | ORAL | Status: DC
  Administered 2019-12-19 – 2019-12-20 (×2): 10 mg via ORAL
  Filled 2019-12-18 (×2): qty 1

## 2019-12-18 MED ORDER — INSULIN ASPART 100 UNIT/ML ~~LOC~~ SOLN
0.0000 [IU] | Freq: Every day | SUBCUTANEOUS | Status: DC
  Administered 2019-12-19: 3 [IU] via SUBCUTANEOUS

## 2019-12-18 MED ORDER — INSULIN ASPART 100 UNIT/ML ~~LOC~~ SOLN
0.0000 [IU] | Freq: Three times a day (TID) | SUBCUTANEOUS | Status: DC
  Administered 2019-12-19: 3 [IU] via SUBCUTANEOUS
  Administered 2019-12-19: 2 [IU] via SUBCUTANEOUS
  Administered 2019-12-20: 3 [IU] via SUBCUTANEOUS
  Administered 2019-12-20: 2 [IU] via SUBCUTANEOUS
  Administered 2019-12-20: 3 [IU] via SUBCUTANEOUS
  Administered 2019-12-21: 2 [IU] via SUBCUTANEOUS
  Administered 2019-12-21: 3 [IU] via SUBCUTANEOUS

## 2019-12-18 MED ORDER — ENOXAPARIN SODIUM 40 MG/0.4ML ~~LOC~~ SOLN
40.0000 mg | SUBCUTANEOUS | Status: DC
  Administered 2019-12-18 – 2019-12-20 (×3): 40 mg via SUBCUTANEOUS
  Filled 2019-12-18 (×3): qty 0.4

## 2019-12-18 MED ORDER — LORATADINE 10 MG PO TABS
10.0000 mg | ORAL_TABLET | Freq: Every day | ORAL | Status: DC
  Administered 2019-12-19 – 2019-12-21 (×3): 10 mg via ORAL
  Filled 2019-12-18 (×3): qty 1

## 2019-12-18 MED ORDER — ALBUTEROL SULFATE (2.5 MG/3ML) 0.083% IN NEBU: 2.5000 mg | INHALATION_SOLUTION | RESPIRATORY_TRACT | Status: DC | PRN

## 2019-12-18 MED ORDER — NITROGLYCERIN 0.4 MG SL SUBL: 0.4000 mg | SUBLINGUAL_TABLET | SUBLINGUAL | Status: DC | PRN

## 2019-12-18 MED ORDER — AMLODIPINE BESYLATE 5 MG PO TABS
5.0000 mg | ORAL_TABLET | Freq: Every day | ORAL | Status: DC
  Administered 2019-12-19 – 2019-12-21 (×3): 5 mg via ORAL
  Filled 2019-12-18 (×3): qty 1

## 2019-12-18 MED ORDER — LOSARTAN POTASSIUM 50 MG PO TABS
100.0000 mg | ORAL_TABLET | Freq: Every day | ORAL | Status: DC
  Administered 2019-12-19 – 2019-12-21 (×3): 100 mg via ORAL
  Filled 2019-12-18 (×3): qty 2

## 2019-12-18 MED ORDER — SODIUM CHLORIDE 0.9 % IV SOLN: INTRAVENOUS | Status: DC

## 2019-12-18 MED ORDER — METOPROLOL TARTRATE 50 MG PO TABS
50.0000 mg | ORAL_TABLET | Freq: Two times a day (BID) | ORAL | Status: DC
Start: 2019-12-18 — End: 2019-12-21
  Administered 2019-12-18 – 2019-12-21 (×6): 50 mg via ORAL
  Filled 2019-12-18 (×6): qty 1

## 2019-12-18 MED ORDER — POLYETHYLENE GLYCOL 3350 17 G PO PACK: 17.0000 g | PACK | Freq: Every day | ORAL | Status: DC | PRN

## 2019-12-18 MED ORDER — SODIUM CHLORIDE 0.9% FLUSH
3.0000 mL | Freq: Two times a day (BID) | INTRAVENOUS | Status: DC
  Administered 2019-12-18 – 2019-12-21 (×6): 3 mL via INTRAVENOUS

## 2019-12-18 MED ORDER — OXYCODONE HCL 5 MG PO TABS
5.0000 mg | ORAL_TABLET | Freq: Four times a day (QID) | ORAL | Status: DC | PRN
  Administered 2019-12-18 – 2019-12-21 (×7): 5 mg via ORAL
  Filled 2019-12-18 (×7): qty 1

## 2019-12-18 MED ORDER — ANASTROZOLE 1 MG PO TABS
1.0000 mg | ORAL_TABLET | Freq: Every day | ORAL | Status: DC
  Administered 2019-12-19 – 2019-12-21 (×3): 1 mg via ORAL
  Filled 2019-12-18 (×3): qty 1

## 2019-12-18 MED ORDER — CLONIDINE HCL 0.1 MG PO TABS
0.1000 mg | ORAL_TABLET | Freq: Two times a day (BID) | ORAL | Status: DC
Start: 2019-12-18 — End: 2019-12-21
  Administered 2019-12-18 – 2019-12-21 (×6): 0.1 mg via ORAL
  Filled 2019-12-18 (×6): qty 1

## 2019-12-18 MED ORDER — LEVOTHYROXINE SODIUM 75 MCG PO TABS
75.0000 ug | ORAL_TABLET | Freq: Every day | ORAL | Status: DC
  Administered 2019-12-19 – 2019-12-21 (×3): 75 ug via ORAL
  Filled 2019-12-18 (×3): qty 1

## 2019-12-18 MED ORDER — ACETAMINOPHEN 325 MG PO TABS
650.0000 mg | ORAL_TABLET | Freq: Four times a day (QID) | ORAL | Status: DC | PRN
  Administered 2019-12-18 – 2019-12-21 (×2): 650 mg via ORAL
  Filled 2019-12-18 (×2): qty 2

## 2019-12-18 MED ORDER — CLOPIDOGREL BISULFATE 75 MG PO TABS
75.0000 mg | ORAL_TABLET | Freq: Every day | ORAL | Status: DC
  Administered 2019-12-19 – 2019-12-21 (×3): 75 mg via ORAL
  Filled 2019-12-18 (×3): qty 1

## 2019-12-18 MED ORDER — ACETAMINOPHEN 650 MG RE SUPP: 650.0000 mg | Freq: Four times a day (QID) | RECTAL | Status: DC | PRN

## 2019-12-18 NOTE — H&P (Signed)
History and Physical    [name]:[id number] DOB: 1946-07-19 DOA: 12/18/2019  PCP: Shirline Frees, MD   I have briefly reviewed patients previous medical reports in [facility].  Patient coming from: Home  Chief Complaint: Fall  HPI: Susan Barnett is a 74 year old married female, independent of her activities, of CAD, CABG for repeat in-stent stenosis of right coronary, hypertension, IDDM/type II DM, hyperlipidemia, OSA noncompliant with CPAP, left breast cancer s/p left lumpectomy, radiation therapy and on Arimidex, iron deficiency anemia, presented to [facility] ED via EMS following a fall at home.  Patient evaluated along with her friend at bedside.  Patient reported that about noon time today, she was helping her spouse to get the groceries into her house.  The electric vacuum cleaner was heading towards the main door and to prevent it from getting out, she seemed to hurry up a little bit, tripped and fell face forward onto hardwood floor.  She cut her lower lip, hit her chin and anterior chest wall.  No head or neck injury.  No bleeding reported.  No LOC.  No preceding dizziness, lightheadedness, palpitations or chest pain.  At baseline she has difficulty getting up without assistance of holding onto something like furniture.  She was thereby unable to get up and her spouse was unable to help her because of his disabilities.  She called 911.  Currently reports anterior chest wall pain more on the right parasternal area and some back pain.  She denies fevers or chills, dysuria or urinary frequency.  Appetite has been good.  She drinks a lot of sweet tea.  She reports that this was her third fall within a month's time.  The last one was a week ago and the one prior to that was a month ago, all mechanical falls without syncope.  She completed 2 doses of her COVID-19 vaccine several weeks ago.  ED Course: Afebrile, blood pressures marginally high but other vital signs stable.   Lab work significant for sodium 123, chloride 86, glucose 137, WBC 15.8.  Chest x-ray: No active cardiopulmonary disease.  CT head: No evidence of acute intracranial abnormality.  Mild chronic small vessel ischemic disease and cerebral atrophy.  CT C-spine: No cervical spine fracture.  CT maxillofacial: No acute findings.  Review of Systems:  All other systems reviewed and apart from HPI, are negative.  Patient reports some chronic right shoulder pain from the first fall for which she is seeing orthopedics and this is no worse than prior.  Past Medical History:  Diagnosis Date  . Anemia    mild  . Anginal pain ([place])    with exertion, last time prior to cardiac caht 06/02/14  . Anxiety    situational  . Arthritis   . Breast cancer ([place]) 2018   Left Breast Cancer  . CAD (coronary artery disease)    Dr. Linard Millers follows, no problems now  . Cancer [facility])    Breast cancer  . Complication of anesthesia   . Diabetes mellitus, type 2 ([place])    TYPE 2  . Dysrhythmia    occ skips a beat, rate was fast before beta blocker.  . Gastric polyp   . Genetic testing 04/12/2017   Ms. Dogan underwent genetic counseling and testing for hereditary cancer syndromes on 02/19/2017. Her results were negative for mutations in all 46 genes analyzed by [facility]'s 46-gene Common Hereditary Cancers Panel. Genes analyzed include: APC, ATM, AXIN2, BARD1, BMPR1A, BRCA1, BRCA2, BRIP1,  CDH1, CDKN2A, CHEK2, CTNNA1, DICER1, EPCAM, GREM1, HOXB13, KIT, MEN1, MLH1, MSH2, MSH3, MSH6, MUTYH, NBN  . GERD (gastroesophageal reflux disease)   . Headache    hx of  . Heartburn   . Hiatal hernia    mild head tremors  . History of radiation therapy 01/30/17- 02/21/17   Left Breast 42.56 Gy in 16 fractions  . Hypertension   . Hypothyroidism   . Occasional tremors    "of head", slight hands  . Osteopenia   . Personal history of radiation therapy 2018   Left Breast Cancer  . Pneumonia   . PONV (postoperative nausea and vomiting)  02/24/14  . Shortness of breath    with exertion  . Sleep apnea    cpap used nightly x 2 yrs now.    Past Surgical History:  Procedure Laterality Date  . BREAST BIOPSY Left 1987  . BREAST LUMPECTOMY Left 01/25/2017  . BREAST LUMPECTOMY WITH RADIOACTIVE SEED AND SENTINEL LYMPH NODE BIOPSY Left 12/25/2016   Procedure: BREAST LUMPECTOMY WITH RADIOACTIVE SEED AND SENTINEL LYMPH NODE BIOPSY;  Surgeon: [name], MD;  Location: [place];  Service: General;  Laterality: Left;  . CARDIAC CATHETERIZATION  06/02/2014   LAD 30%, D1 80%, CFX 755, RCA > 95% ISR, rx w/ PTCA, heavy calcification, EF 65%  . CORONARY ARTERY BYPASS GRAFT N/A 06/14/2014   Procedure: CORONARY ARTERY BYPASS GRAFTING (CABG);  Surgeon: Gaye Pollack, MD;  Location: [place];  Service: Open Heart Surgery;  Laterality: N/A;  Times 2 using endoscopically harvested right saphenous vein.  . coronary stents     x3 stents placed-prior ablation  . DILATION AND CURETTAGE OF UTERUS    . ELBOW FRACTURE SURGERY Left   . INCONTINENCE SURGERY     sling  . KNEE ARTHROSCOPY Right 02/24/2014   Procedure: RIGHT KNEE ARTHROSCOPY WITH DEBRIDEMENT ;  Surgeon: Gearlean Alf, MD;  Location: [facility];  Service: Orthopedics;  Laterality: Right;  . LEFT HEART CATHETERIZATION WITH CORONARY ANGIOGRAM N/A 06/02/2014   Procedure: LEFT HEART CATHETERIZATION WITH CORONARY ANGIOGRAM;  Surgeon: Sinclair Grooms, MD;  [facility] CATH LAB;  Service: Cardiovascular;  Laterality: N/A;  . TEE WITHOUT CARDIOVERSION N/A 06/14/2014   Procedure: TRANSESOPHAGEAL ECHOCARDIOGRAM (TEE);  Surgeon: Gaye Pollack, MD;  Location: [place];  Service: Open Heart Surgery;  Laterality: N/A;    Social History  reports that she has never smoked. She has never used smokeless tobacco. She reports that she does not drink alcohol or use drugs.  Allergies  Allergen Reactions  . Asa [Aspirin] Anaphylaxis  . Compazine [Prochlorperazine Edisylate] Swelling and Other (See Comments)      Tongue swelling   . Nsaids Hives and Swelling  . Shellfish Allergy Hives and Swelling  . Sulfa Antibiotics Hives and Swelling  . Zantac [Ranitidine Hcl] Hives and Swelling  . Lisinopril Cough    Family History  Problem Relation Age of Onset  . Heart attack Mother   . Hypertension Mother   . Diabetes Mother   . Lung cancer Brother        d.57 from metastatic disease. History of smoking.  Marland Kitchen Healthy Brother   . Breast cancer Cousin 30       d.30s  . Hodgkin's lymphoma Maternal Uncle        d.62s  . Ulcerative colitis Daughter      Prior to Admission medications   Medication Sig Start Date End Date Taking? Authorizing Provider  amLODipine (NORVASC) 5 MG  tablet Take 5 mg by mouth daily.    Yes [provider]  anastrozole (ARIMIDEX) 1 MG tablet Take 1 tablet (1 mg total) by mouth daily. 05/29/19  Yes [name], MD  atorvastatin (LIPITOR) 10 MG tablet Take 10 mg by mouth every evening.    Yes [provider]  CALCIUM PO Take 1 tablet by mouth in the morning and at bedtime.   Yes [provider]  cetirizine (ZYRTEC) 10 MG tablet Take 10 mg by mouth daily.   Yes [provider]  cloNIDine (CATAPRES) 0.1 MG tablet Take 1 tablet by mouth twice daily 04/22/19  Yes [name], MD  clopidogrel (PLAVIX) 75 MG tablet Take 75 mg by mouth daily.   Yes [provider]  Coenzyme Q10 300 MG CAPS Take 300 mg by mouth 2 (two) times daily.   Yes [provider]  diphenhydramine-acetaminophen (TYLENOL PM) 25-500 MG TABS tablet Take 1 tablet by mouth at bedtime as needed (For pain).    Yes [provider]  hydrochlorothiazide (HYDRODIURIL) 25 MG tablet Take 1 tablet (25 mg total) by mouth daily. 11/11/19  Yes [name], MD  LANTUS SOLOSTAR 100 UNIT/ML Solostar Pen Inject 40 Units into the skin at bedtime. Patient taking differently: Inject 50 Units into the skin at bedtime.  05/23/18  Yes [name], MD  levothyroxine  (SYNTHROID, LEVOTHROID) 75 MCG tablet Take 75 mcg by mouth daily before breakfast.    Yes [provider]  losartan (COZAAR) 100 MG tablet Take 1 tablet (100 mg total) by mouth daily. 10/28/19  Yes [name], MD  metoprolol (LOPRESSOR) 50 MG tablet Take 1 tablet (50 mg total) by mouth 2 (two) times daily. 09/08/14  Yes [name], MD  nitroGLYCERIN (NITROSTAT) 0.4 MG SL tablet Place 1 tablet (0.4 mg total) under the tongue every 5 (five) minutes as needed for chest pain. 08/29/18 12/18/19 Yes [name], MD  pantoprazole (PROTONIX) 40 MG tablet Take 1 tablet (40 mg total) by mouth daily before breakfast. 09/08/14  Yes [name], MD  Polyethyl Glycol-Propyl Glycol (SYSTANE OP) Place 1 drop into both eyes in the morning and at bedtime.   Yes [provider]  potassium chloride (KLOR-CON) 10 MEQ tablet Take 1 tablet (10 mEq total) by mouth daily. 10/19/19 01/17/20 Yes [name], MD  sitaGLIPtin-metformin (JANUMET) 50-1000 MG tablet Take 1 tablet by mouth 2 (two) times daily with a meal.   Yes [provider]  spironolactone (ALDACTONE) 25 MG tablet Take 0.5 tablets (12.5 mg total) by mouth daily. Patient taking differently: Take 25 mg by mouth every other day.  10/19/19  Yes [name], MD    Physical Exam: Vitals:   12/18/19 1547 12/18/19 1600 12/18/19 1630 12/18/19 1708  BP: (!) 151/72 121/88 (!) 133/92 (!) 162/88  Pulse: 70 68 66 68  Resp: 18 15 19 20   Temp:      TempSrc:      SpO2: 99% 99% 100% 100%  Weight:      Height:        Patient was examined along with her female RN as chaperone in the room and also patient's female friend was at bedside.  Constitutional: Pleasant elderly female, moderately built and nourished lying comfortably propped up in bed without distress. Eyes: PERTLA, lids and conjunctivae normal ENMT: Mucous membranes are moist. Posterior pharynx clear of any exudate or lesions. Normal dentition.  Mild cut and swelling of right  side of lower lip.  Mild bruising of her chin. Neck: supple, no masses, no thyromegaly Respiratory: Clear to auscultation without wheezing, rhonchi or crackles. No increased work of breathing.  Midline sternotomy scar of CABG.  Mild anterior chest wall tenderness, more on right parasternal area without external bruising or irregularity of bony areas. Cardiovascular: S1 & S2 heard, regular rate and rhythm. No JVD, murmurs, rubs or clicks. No pedal edema.  Telemetry personally reviewed: Sinus rhythm Abdomen: Non distended. Non tender. Soft. No organomegaly or masses appreciated. No clinical Ascites. Normal bowel sounds heard. Musculoskeletal: no clubbing / cyanosis. No joint deformity upper and lower extremities. Good ROM, no contractures. Normal muscle tone.  Skin: no rashes, lesions, ulcers. No induration Neurologic: CN 2-12 grossly intact. Sensation intact, DTR normal. Strength 5/5 in all 4 limbs.  Psychiatric: Normal judgment and insight. Alert and oriented x 3. Normal mood.     Labs on Admission: I have personally reviewed following labs and imaging studies  CBC: Recent Labs  Lab 12/18/19 1458  WBC 15.8*  HGB 13.0  HCT 38.9  MCV 88.4  PLT 939    Basic Metabolic Panel: Recent Labs  Lab 12/18/19 1458  NA 123*  K 4.5  CL 86*  CO2 25  GLUCOSE 137*  BUN 15  CREATININE 0.73  CALCIUM 9.7    Liver Function Tests: Recent Labs  Lab 12/18/19 1458  AST 23  ALT 22  ALKPHOS 63  BILITOT 0.6  PROT 7.5  ALBUMIN 4.2    Radiological Exams on Admission: DG Chest 2 View  Result Date: 12/18/2019 CLINICAL DATA:  Fall.  Pain with inspiration on the right-side. EXAM: CHEST - 2 VIEW COMPARISON:  July 14, 2014 FINDINGS: The heart size and mediastinal contours are within normal limits. Both lungs are clear. The visualized skeletal structures are unremarkable. The patient is status post prior median sternotomy. IMPRESSION: No active cardiopulmonary disease. Electronically Signed   By:  Constance Holster M.D.   On: 12/18/2019 15:47   CT HEAD WO CONTRAST  Result Date: 12/18/2019 CLINICAL DATA:  Fall. Head trauma. Facial bruising. On Plavix. EXAM: CT HEAD WITHOUT CONTRAST CT MAXILLOFACIAL WITHOUT CONTRAST CT CERVICAL SPINE WITHOUT CONTRAST TECHNIQUE: Multidetector CT imaging of the head, cervical spine, and maxillofacial structures were performed using the standard protocol without intravenous contrast. Multiplanar CT image reconstructions of the cervical spine and maxillofacial structures were also generated. COMPARISON:  None. FINDINGS: CT HEAD FINDINGS Brain: There is no evidence of acute infarct, intracranial hemorrhage, mass, midline shift, or extra-axial fluid collection. Mild lateral and third ventriculomegaly is favored to reflect central predominant cerebral atrophy rather than hydrocephalus. Hypodensities in the cerebral white matter bilaterally are nonspecific but compatible with mild chronic small vessel ischemic disease. Vascular: Calcified atherosclerosis at the skull base. No hyperdense vessel. Skull: No fracture or suspicious osseous lesion. Other: None. CT MAXILLOFACIAL FINDINGS Osseous: No fracture, suspicious osseous lesion, or mandibular dislocation. Orbits: Bilateral cataract extraction. No acute traumatic finding. Sinuses: Paranasal sinuses and mastoid air cells are clear. Soft tissues: Carotid atherosclerosis and right tonsillar calcifications. CT CERVICAL SPINE FINDINGS Alignment: Normal. Skull base and vertebrae: No acute fracture or suspicious osseous lesion. Soft tissues and spinal canal: No prevertebral fluid or swelling. No visible canal hematoma. Disc levels: Mild multilevel cervical disc and facet degeneration without evidence of high-grade spinal stenosis. Moderate bilateral neural foraminal stenosis at C3-4 due to uncovertebral and facet spurring. Upper chest: 3 mm nodule in the posterior right upper lobe. Other: Asymmetric enlargement of the  left thyroid lobe;  this has been documented on prior studies and biopsied in 2019. IMPRESSION: 1. No evidence of acute intracranial abnormality. 2. Mild chronic small vessel ischemic disease and cerebral atrophy. 3. No acute maxillofacial or cervical spine fracture. Electronically Signed   By: Logan Bores M.D.   On: 12/18/2019 15:44   CT Cervical Spine Wo Contrast  Result Date: 12/18/2019 CLINICAL DATA:  Fall. Head trauma. Facial bruising. On Plavix. EXAM: CT HEAD WITHOUT CONTRAST CT MAXILLOFACIAL WITHOUT CONTRAST CT CERVICAL SPINE WITHOUT CONTRAST TECHNIQUE: Multidetector CT imaging of the head, cervical spine, and maxillofacial structures were performed using the standard protocol without intravenous contrast. Multiplanar CT image reconstructions of the cervical spine and maxillofacial structures were also generated. COMPARISON:  None. FINDINGS: CT HEAD FINDINGS Brain: There is no evidence of acute infarct, intracranial hemorrhage, mass, midline shift, or extra-axial fluid collection. Mild lateral and third ventriculomegaly is favored to reflect central predominant cerebral atrophy rather than hydrocephalus. Hypodensities in the cerebral white matter bilaterally are nonspecific but compatible with mild chronic small vessel ischemic disease. Vascular: Calcified atherosclerosis at the skull base. No hyperdense vessel. Skull: No fracture or suspicious osseous lesion. Other: None. CT MAXILLOFACIAL FINDINGS Osseous: No fracture, suspicious osseous lesion, or mandibular dislocation. Orbits: Bilateral cataract extraction. No acute traumatic finding. Sinuses: Paranasal sinuses and mastoid air cells are clear. Soft tissues: Carotid atherosclerosis and right tonsillar calcifications. CT CERVICAL SPINE FINDINGS Alignment: Normal. Skull base and vertebrae: No acute fracture or suspicious osseous lesion. Soft tissues and spinal canal: No prevertebral fluid or swelling. No visible canal hematoma. Disc levels: Mild multilevel cervical disc and  facet degeneration without evidence of high-grade spinal stenosis. Moderate bilateral neural foraminal stenosis at C3-4 due to uncovertebral and facet spurring. Upper chest: 3 mm nodule in the posterior right upper lobe. Other: Asymmetric enlargement of the left thyroid lobe; this has been documented on prior studies and biopsied in 2019. IMPRESSION: 1. No evidence of acute intracranial abnormality. 2. Mild chronic small vessel ischemic disease and cerebral atrophy. 3. No acute maxillofacial or cervical spine fracture. Electronically Signed   By: Logan Bores M.D.   On: 12/18/2019 15:44   CT Maxillofacial Wo Contrast  Result Date: 12/18/2019 CLINICAL DATA:  Fall. Head trauma. Facial bruising. On Plavix. EXAM: CT HEAD WITHOUT CONTRAST CT MAXILLOFACIAL WITHOUT CONTRAST CT CERVICAL SPINE WITHOUT CONTRAST TECHNIQUE: Multidetector CT imaging of the head, cervical spine, and maxillofacial structures were performed using the standard protocol without intravenous contrast. Multiplanar CT image reconstructions of the cervical spine and maxillofacial structures were also generated. COMPARISON:  None. FINDINGS: CT HEAD FINDINGS Brain: There is no evidence of acute infarct, intracranial hemorrhage, mass, midline shift, or extra-axial fluid collection. Mild lateral and third ventriculomegaly is favored to reflect central predominant cerebral atrophy rather than hydrocephalus. Hypodensities in the cerebral white matter bilaterally are nonspecific but compatible with mild chronic small vessel ischemic disease. Vascular: Calcified atherosclerosis at the skull base. No hyperdense vessel. Skull: No fracture or suspicious osseous lesion. Other: None. CT MAXILLOFACIAL FINDINGS Osseous: No fracture, suspicious osseous lesion, or mandibular dislocation. Orbits: Bilateral cataract extraction. No acute traumatic finding. Sinuses: Paranasal sinuses and mastoid air cells are clear. Soft tissues: Carotid atherosclerosis and right tonsillar  calcifications. CT CERVICAL SPINE FINDINGS Alignment: Normal. Skull base and vertebrae: No acute fracture or suspicious osseous lesion. Soft tissues and spinal canal: No prevertebral fluid or swelling. No visible canal hematoma. Disc levels: Mild multilevel cervical disc and facet degeneration without evidence of high-grade spinal stenosis. Moderate  bilateral neural foraminal stenosis at C3-4 due to uncovertebral and facet spurring. Upper chest: 3 mm nodule in the posterior right upper lobe. Other: Asymmetric enlargement of the left thyroid lobe; this has been documented on prior studies and biopsied in 2019. IMPRESSION: 1. No evidence of acute intracranial abnormality. 2. Mild chronic small vessel ischemic disease and cerebral atrophy. 3. No acute maxillofacial or cervical spine fracture. Electronically Signed   By: Logan Bores M.D.   On: 12/18/2019 15:44    EKG: Independently reviewed.  Normal sinus rhythm at 63 bpm, normal axis, no acute changes and QTC 449 ms.  Assessment/Plan Principal Problem:   Hyponatremia Active Problems:   Hypothyroidism   Type 2 diabetes mellitus with complications (HCC)   Essential hypertension   GERD   Hyperlipidemia   OSA (obstructive sleep apnea)     1. Hyponatremia: Suspect due to HCTZ and may be some excess free water intake.  She has resistant hypertension and is on polypharmacy for same including HCTZ and Aldactone.  She has some degree of chronic hyponatremia with serum sodium ranging in the high 120s to low 130s.  Given her advanced age, would consider stopping HCTZ indefinitely.  The subacute hyponatremia may be a contributor to her falls.  Check TSH.  Check serum and urine osmolarity.  Held HCTZ.  Briefly hydrated with IV normal saline and follow BMP closely.  Aim to correct serum sodium no more than 8-10 mEq/day. 2. Mechanical falls, recurrent: Appears to have tripped on something each of the 3 times.  No concern for syncope or arrhythmias.  Supportive  treatment with pain medications.  PT and OT evaluation. 3. CAD s/p CABG: Asymptomatic of angina like symptoms.  Has not used as needed NTG recently.  Continue Plavix, statins and metoprolol. 4. Essential hypertension: Suspect resistant to controlled given her polypharmacy.  Held HCTZ and Aldactone.  Continue amlodipine, Catapres, Cozaar and metoprolol. 5. Type II DM/IDDM: Placed on reduced dose of Lantus, add SSI.  Monitor CBGs closely and adjust insulins as needed. 6. Hyperlipidemia: Continue statins. 7. OSA: Apparently her CPAP machine is ill fitting and has not used it in more than a year.  Outpatient follow-up. 8. Left breast cancer status post lumpectomy, radiation: Follows with oncology.  Continue Arimidex.   DVT prophylaxis: Lovenox Code Status: Full Family Communication: Discussed in detail with patient's female friend at bedside, updated care and answered questions. Disposition Plan:   Patient is from:  Home  Anticipated DC to:  Home  Anticipated DC date:  12/19/2019  Anticipated DC barriers: Improvement in hyponatremia and therapies evaluation after significant fall.   Consults called: None Admission status: None  Severity of Illness: The appropriate patient status for this patient is OBSERVATION. Observation status is judged to be reasonable and necessary in order to provide the required intensity of service to ensure the patient's safety. The patient's presenting symptoms, physical exam findings, and initial radiographic and laboratory data in the context of their medical condition is felt to place them at decreased risk for further clinical deterioration. Furthermore, it is anticipated that the patient will be medically stable for discharge from the hospital within 2 midnights of admission. The following factors support the patient status of observation.   " The patient's presenting symptoms include fall with injury to her lower lip and chin and anterior chest wall. " The  physical exam findings include bruising of her chin, small cut and swelling of her lower lip, anterior chest wall tenderness. " The initial radiographic  and laboratory data are serum sodium 123 CT head and neck and maxillofacial without acute injuries.      Vernell Leep MD [facility]  To contact the attending provider between 7A-7P or the covering provider during after hours 7P-7A, please log into the web site [email or web address] and access using universal [place] password for that web site. If you do not have the password, please call the hospital operator.  12/18/2019, 5:29 PM

## 2019-12-18 NOTE — Progress Notes (Signed)
NEW ADMISSION NOTE New Admission Note:   Arrival Method: from ED via stretcher  Mental Orientation: axox4 Telemetry: box 17m6 Assessment: Completed Skin: see skin assessment [id number] Pain: denies Tubes:none Safety Measures: Safety Fall Prevention Plan has been discussed  Admission: To be Completed [facility] [place] Orientation: Patient has been orientated to the room, unit and staff.  Family: friend at the bedside   Orders have been reviewed and implemented. Will continue to monitor the patient. Call light has been placed within reach and bed alarm has been activated.   Paulla Fore, RN

## 2019-12-18 NOTE — ED Triage Notes (Signed)
[facility] reports pt had mechanical fall, landing on face and torso. Pain with inspiration on the right side. Bruising to face and bite mark to lip. No loc, pt does take plavix.

## 2019-12-18 NOTE — Progress Notes (Signed)
Orthopedic Tech Progress Note Patient Details:  [name] [birth year] [id number] Level 2 trauma Patient ID: Susan Barnett, female   DOB: 11/03/1945, 74 y.o.   MRN: [id number]   Susan Barnett 12/18/2019, 2:20 PM

## 2019-12-18 NOTE — ED Notes (Signed)
Called CT to let them know pt is ready for CT

## 2019-12-18 NOTE — ED Notes (Signed)
Pt transported to CT ?

## 2019-12-18 NOTE — ED Provider Notes (Signed)
[facility] EMERGENCY DEPARTMENT Provider Note   CSN: [id number] Arrival date & time: 12/18/19  1415     History Chief Complaint  Patient presents with  . Fall    Level 2 pt on thinners    Susan Barnett is a 74 y.o. female.  Susan Barnett is a 74 year old female with a history of CAD, DM 2, hypertension, hypothyroidism, sleep apnea and GERD who presents with a mechanical fall.  Susan Barnett presents after a ground-level fall where she tripped over her shoe.  She says this is the third fall in a couple of weeks.  She denies presyncopal symptoms including chest pain, shortness of breath, palpitations, lightheadedness and dizziness.  She denies syncope.  She denies loss of consciousness.  The fall was unwitnessed.  She denies incontinence of urine or feces.  She reports falling flat on her face.  She reports tasting a small amount of blood.  Her left knee as well as her chin.  She denies a large amount of blood.  She denies any loss of teeth. She reports her prior falls were mechanical as well.  She reports falling over her vacuum and over the threshold of the doorway previously.  She denies hitting her head with prior falls.  She does endorse right shoulder pain from a prior fall for which she saw an orthopedic surgeon who diagnosed her with tendinitis.  She reports her fall started after taking Tessalon Perles.  She is taking [employer] when the first 2 falls happen but has since quit taking them and this fall happened.  She does report some deconditioning since the pandemic began.  She is to go to cardiac rehab.  She has not been as active.  She endorses a weak feeling in her knees and says she has to walk extremely carefully she goes down the stairs especially.  At baseline she walks without a cane or walker.  On ROS she denies fevers, chills, sore throat, rhinorrhea, dysuria, chest pain shortness of breath.  She does endorse some sharp pain on inspiration.  This is new since  her fall.         Past Medical History:  Diagnosis Date  . Anemia    mild  . Anginal pain ([place])    with exertion, last time prior to cardiac caht 06/02/14  . Anxiety    situational  . Arthritis   . Breast cancer ([place]) 2018   Left Breast Cancer  . CAD (coronary artery disease)    Dr. Linard Millers follows, no problems now  . Cancer [facility])    Breast cancer  . Complication of anesthesia   . Diabetes mellitus, type 2 ([place])    TYPE 2  . Dysrhythmia    occ skips a beat, rate was fast before beta blocker.  . Gastric polyp   . Genetic testing 04/12/2017   Susan Barnett underwent genetic counseling and testing for hereditary cancer syndromes on 02/19/2017. Her results were negative for mutations in all 46 genes analyzed by [facility]'s 46-gene Common Hereditary Cancers Panel. Genes analyzed include: APC, ATM, AXIN2, BARD1, BMPR1A, BRCA1, BRCA2, BRIP1, CDH1, CDKN2A, CHEK2, CTNNA1, DICER1, EPCAM, GREM1, HOXB13, KIT, MEN1, MLH1, MSH2, MSH3, MSH6, MUTYH, NBN  . GERD (gastroesophageal reflux disease)   . Headache    hx of  . Heartburn   . Hiatal hernia    mild head tremors  . History of radiation therapy 01/30/17- 02/21/17   Left Breast 42.56 Gy in 16 fractions  .  Hypertension   . Hypothyroidism   . Occasional tremors    "of head", slight hands  . Osteopenia   . Personal history of radiation therapy 2018   Left Breast Cancer  . Pneumonia   . PONV (postoperative nausea and vomiting) 02/24/14  . Shortness of breath    with exertion  . Sleep apnea    cpap used nightly x 2 yrs now.    Patient Active Problem List   Diagnosis Date Noted  . Iron deficiency anemia 08/20/2017  . OSA (obstructive sleep apnea) 05/16/2017  . Genetic testing 04/12/2017  . Malignant neoplasm of upper-outer quadrant of left breast in female, estrogen receptor positive ([place]) 10/15/2016  . Hyperlipidemia 07/14/2015  . Coronary artery disease involving coronary bypass graft of native heart with angina pectoris ([place])  06/14/2014  . Angina pectoris, crescendo ([place]) 06/02/2014  . Lateral meniscal tear 02/23/2014  . GASTRIC POLYP 11/19/2007  . HYPOTHYROIDISM 11/19/2007  . Type 2 diabetes mellitus with complications ([place]) 45/62/5638  . Essential hypertension 11/19/2007  . GERD 11/19/2007  . HIATAL HERNIA 11/19/2007  . RECTAL BLEEDING 11/19/2007  . HEARTBURN 11/19/2007    Past Surgical History:  Procedure Laterality Date  . BREAST BIOPSY Left 1987  . BREAST LUMPECTOMY Left 01/25/2017  . BREAST LUMPECTOMY WITH RADIOACTIVE SEED AND SENTINEL LYMPH NODE BIOPSY Left 12/25/2016   Procedure: BREAST LUMPECTOMY WITH RADIOACTIVE SEED AND SENTINEL LYMPH NODE BIOPSY;  Surgeon: [name], MD;  Location: [facility];  Service: General;  Laterality: Left;  . CARDIAC CATHETERIZATION  06/02/2014   LAD 30%, D1 80%, CFX 755, RCA > 95% ISR, rx w/ PTCA, heavy calcification, EF 65%  . CORONARY ARTERY BYPASS GRAFT N/A 06/14/2014   Procedure: CORONARY ARTERY BYPASS GRAFTING (CABG);  Surgeon: Gaye Pollack, MD;  Location: [facility];  Service: Open Heart Surgery;  Laterality: N/A;  Times 2 using endoscopically harvested right saphenous vein.  . coronary stents     x3 stents placed-prior ablation  . DILATION AND CURETTAGE OF UTERUS    . ELBOW FRACTURE SURGERY Left   . INCONTINENCE SURGERY     sling  . KNEE ARTHROSCOPY Right 02/24/2014   Procedure: RIGHT KNEE ARTHROSCOPY WITH DEBRIDEMENT ;  Surgeon: Gearlean Alf, MD;  Location: [facility];  Service: Orthopedics;  Laterality: Right;  . LEFT HEART CATHETERIZATION WITH CORONARY ANGIOGRAM N/A 06/02/2014   Procedure: LEFT HEART CATHETERIZATION WITH CORONARY ANGIOGRAM;  Surgeon: Sinclair Grooms, MD;  Location: [facility] CATH LAB;  Service: Cardiovascular;  Laterality: N/A;  . TEE WITHOUT CARDIOVERSION N/A 06/14/2014   Procedure: TRANSESOPHAGEAL ECHOCARDIOGRAM (TEE);  Surgeon: Gaye Pollack, MD;  Location: [facility];  Service: Open Heart Surgery;  Laterality: N/A;     OB History   No obstetric  history on file.     Family History  Problem Relation Age of Onset  . Heart attack Mother   . Hypertension Mother   . Diabetes Mother   . Lung cancer Brother        d.57 from metastatic disease. History of smoking.  Marland Kitchen Healthy Brother   . Breast cancer Cousin 30       d.30s  . Hodgkin's lymphoma Maternal Uncle        d.62s  . Ulcerative colitis Daughter     Social History   Tobacco Use  . Smoking status: Never Smoker  . Smokeless tobacco: Never Used  Substance Use Topics  . Alcohol use: No  . Drug use: No    Home  Medications Prior to Admission medications   Medication Sig Start Date End Date Taking? Authorizing Provider  amLODipine (NORVASC) 5 MG tablet Take 5 mg by mouth daily.     [provider]  anastrozole (ARIMIDEX) 1 MG tablet Take 1 tablet (1 mg total) by mouth daily. 05/29/19   [name], MD  atorvastatin (LIPITOR) 10 MG tablet Take 10 mg by mouth every evening.     [provider]  cetirizine (ZYRTEC) 10 MG tablet Take 10 mg by mouth daily.    [provider]  cloNIDine (CATAPRES) 0.1 MG tablet Take 1 tablet by mouth twice daily 04/22/19   [name], MD  clopidogrel (PLAVIX) 75 MG tablet Take 75 mg by mouth daily.    [provider]  Coenzyme Q10 300 MG CAPS Take 300 mg by mouth 2 (two) times daily.    [provider]  diphenhydramine-acetaminophen (TYLENOL PM) 25-500 MG TABS tablet Take 1 tablet by mouth at bedtime as needed.    [provider]  hydrochlorothiazide (HYDRODIURIL) 25 MG tablet Take 1 tablet (25 mg total) by mouth daily. 11/11/19   [name], MD  LANTUS SOLOSTAR 100 UNIT/ML Solostar Pen Inject 40 Units into the skin at bedtime. 05/23/18   [name], MD  levothyroxine (SYNTHROID, LEVOTHROID) 75 MCG tablet Take 75 mcg by mouth daily before breakfast.     [provider]  losartan (COZAAR) 100 MG tablet Take 1 tablet (100 mg total) by mouth daily. 10/28/19   [name], MD    metoprolol (LOPRESSOR) 50 MG tablet Take 1 tablet (50 mg total) by mouth 2 (two) times daily. 09/08/14   [name], MD  nitroGLYCERIN (NITROSTAT) 0.4 MG SL tablet Place 1 tablet (0.4 mg total) under the tongue every 5 (five) minutes as needed for chest pain. 08/29/18 10/19/19  [name], MD  pantoprazole (PROTONIX) 40 MG tablet Take 1 tablet (40 mg total) by mouth daily before breakfast. 09/08/14   [name], MD  potassium chloride (KLOR-CON) 10 MEQ tablet Take 1 tablet (10 mEq total) by mouth daily. 10/19/19 01/17/20  [name], MD  sitaGLIPtin-metformin (JANUMET) 50-1000 MG tablet Take 1 tablet by mouth 2 (two) times daily with a meal.    [provider]  spironolactone (ALDACTONE) 25 MG tablet Take 0.5 tablets (12.5 mg total) by mouth daily. 10/19/19   [name], MD    Allergies    Asa [aspirin], Compazine [prochlorperazine edisylate], Nsaids, Shellfish allergy, Sulfa antibiotics, Zantac [ranitidine hcl], and Lisinopril  Review of Systems   Review of Systems  Constitutional: Negative for chills and fever.  HENT: Negative for rhinorrhea and sore throat.   Respiratory: Negative for shortness of breath.        Pain w/ inspiration  Cardiovascular: Negative for chest pain.  Gastrointestinal: Negative for abdominal pain.  Genitourinary: Negative for dysuria.  Musculoskeletal: Positive for back pain (chronic).       Multiple falls  Neurological: Negative for dizziness, syncope, weakness, light-headedness and numbness.    Physical Exam Updated Vital Signs BP (!) 151/72   Pulse 70   Temp 98.1 F (36.7 C) (Oral)   Resp 18   Ht 5' 2"  (1.575 m)   Wt 72.6 kg   SpO2 99%   BMI 29.26 kg/m   Physical Exam Vitals and nursing note reviewed.  Constitutional:      General: She is not in acute distress. HENT:     Head: Normocephalic.  Comments: Ecchymoses of the chin and lower lip Eyes:     Extraocular Movements: Extraocular movements intact.     Pupils: Pupils  are equal, round, and reactive to light.  Neck:     Comments: Range of motion limited by pain Cardiovascular:     Rate and Rhythm: Normal rate and regular rhythm.     Heart sounds: No murmur.  Pulmonary:     Effort: Pulmonary effort is normal.     Breath sounds: Rales (over the right lung base) present.  Chest:     Chest wall: Tenderness (Point tenderness over the right chest wall) present.  Abdominal:     General: There is no distension.     Tenderness: There is no abdominal tenderness.  Musculoskeletal:        General: Tenderness (Right shoulder) present.     Comments: No point tenderness over the C, T and L spines  Skin:    General: Skin is warm and dry.  Neurological:     General: No focal deficit present.     Mental Status: She is alert and oriented to person, place, and time.     Cranial Nerves: No cranial nerve deficit.     Sensory: No sensory deficit.     Motor: No weakness.  Psychiatric:        Mood and Affect: Mood normal.     ED Results / Procedures / Treatments   Labs (all labs ordered are listed, but only abnormal results are displayed) Labs Reviewed  CBC - Abnormal; Notable for the following components:      Result Value   WBC 15.8 (*)    All other components within normal limits  COMPREHENSIVE METABOLIC PANEL - Abnormal; Notable for the following components:   Sodium 123 (*)    Chloride 86 (*)    Glucose, Bld 137 (*)    All other components within normal limits    EKG EKG Interpretation  Date/Time:  Friday Dec 18 2019 14:39:28 EDT Ventricular Rate:  63 PR Interval:    QRS Duration: 97 QT Interval:  438 QTC Calculation: 449 R Axis:   31 Text Interpretation: Sinus rhythm Probable left atrial enlargement Baseline wander Otherwise within normal limits Confirmed by Carmin Muskrat [phone]) on 12/18/2019 2:50:01 PM   Radiology DG Chest 2 View  Result Date: 12/18/2019 CLINICAL DATA:  Fall.  Pain with inspiration on the right-side. EXAM: CHEST - 2 VIEW  COMPARISON:  July 14, 2014 FINDINGS: The heart size and mediastinal contours are within normal limits. Both lungs are clear. The visualized skeletal structures are unremarkable. The patient is status post prior median sternotomy. IMPRESSION: No active cardiopulmonary disease. Electronically Signed   By: Constance Holster M.D.   On: 12/18/2019 15:47   CT HEAD WO CONTRAST  Result Date: 12/18/2019 CLINICAL DATA:  Fall. Head trauma. Facial bruising. On Plavix. EXAM: CT HEAD WITHOUT CONTRAST CT MAXILLOFACIAL WITHOUT CONTRAST CT CERVICAL SPINE WITHOUT CONTRAST TECHNIQUE: Multidetector CT imaging of the head, cervical spine, and maxillofacial structures were performed using the standard protocol without intravenous contrast. Multiplanar CT image reconstructions of the cervical spine and maxillofacial structures were also generated. COMPARISON:  None. FINDINGS: CT HEAD FINDINGS Brain: There is no evidence of acute infarct, intracranial hemorrhage, mass, midline shift, or extra-axial fluid collection. Mild lateral and third ventriculomegaly is favored to reflect central predominant cerebral atrophy rather than hydrocephalus. Hypodensities in the cerebral white matter bilaterally are nonspecific but compatible with mild chronic small vessel ischemic disease. Vascular: Calcified  atherosclerosis at the skull base. No hyperdense vessel. Skull: No fracture or suspicious osseous lesion. Other: None. CT MAXILLOFACIAL FINDINGS Osseous: No fracture, suspicious osseous lesion, or mandibular dislocation. Orbits: Bilateral cataract extraction. No acute traumatic finding. Sinuses: Paranasal sinuses and mastoid air cells are clear. Soft tissues: Carotid atherosclerosis and right tonsillar calcifications. CT CERVICAL SPINE FINDINGS Alignment: Normal. Skull base and vertebrae: No acute fracture or suspicious osseous lesion. Soft tissues and spinal canal: No prevertebral fluid or swelling. No visible canal hematoma. Disc levels: Mild  multilevel cervical disc and facet degeneration without evidence of high-grade spinal stenosis. Moderate bilateral neural foraminal stenosis at C3-4 due to uncovertebral and facet spurring. Upper chest: 3 mm nodule in the posterior right upper lobe. Other: Asymmetric enlargement of the left thyroid lobe; this has been documented on prior studies and biopsied in 2019. IMPRESSION: 1. No evidence of acute intracranial abnormality. 2. Mild chronic small vessel ischemic disease and cerebral atrophy. 3. No acute maxillofacial or cervical spine fracture. Electronically Signed   By: Logan Bores M.D.   On: 12/18/2019 15:44   CT Cervical Spine Wo Contrast  Result Date: 12/18/2019 CLINICAL DATA:  Fall. Head trauma. Facial bruising. On Plavix. EXAM: CT HEAD WITHOUT CONTRAST CT MAXILLOFACIAL WITHOUT CONTRAST CT CERVICAL SPINE WITHOUT CONTRAST TECHNIQUE: Multidetector CT imaging of the head, cervical spine, and maxillofacial structures were performed using the standard protocol without intravenous contrast. Multiplanar CT image reconstructions of the cervical spine and maxillofacial structures were also generated. COMPARISON:  None. FINDINGS: CT HEAD FINDINGS Brain: There is no evidence of acute infarct, intracranial hemorrhage, mass, midline shift, or extra-axial fluid collection. Mild lateral and third ventriculomegaly is favored to reflect central predominant cerebral atrophy rather than hydrocephalus. Hypodensities in the cerebral white matter bilaterally are nonspecific but compatible with mild chronic small vessel ischemic disease. Vascular: Calcified atherosclerosis at the skull base. No hyperdense vessel. Skull: No fracture or suspicious osseous lesion. Other: None. CT MAXILLOFACIAL FINDINGS Osseous: No fracture, suspicious osseous lesion, or mandibular dislocation. Orbits: Bilateral cataract extraction. No acute traumatic finding. Sinuses: Paranasal sinuses and mastoid air cells are clear. Soft tissues: Carotid  atherosclerosis and right tonsillar calcifications. CT CERVICAL SPINE FINDINGS Alignment: Normal. Skull base and vertebrae: No acute fracture or suspicious osseous lesion. Soft tissues and spinal canal: No prevertebral fluid or swelling. No visible canal hematoma. Disc levels: Mild multilevel cervical disc and facet degeneration without evidence of high-grade spinal stenosis. Moderate bilateral neural foraminal stenosis at C3-4 due to uncovertebral and facet spurring. Upper chest: 3 mm nodule in the posterior right upper lobe. Other: Asymmetric enlargement of the left thyroid lobe; this has been documented on prior studies and biopsied in 2019. IMPRESSION: 1. No evidence of acute intracranial abnormality. 2. Mild chronic small vessel ischemic disease and cerebral atrophy. 3. No acute maxillofacial or cervical spine fracture. Electronically Signed   By: Logan Bores M.D.   On: 12/18/2019 15:44   CT Maxillofacial Wo Contrast  Result Date: 12/18/2019 CLINICAL DATA:  Fall. Head trauma. Facial bruising. On Plavix. EXAM: CT HEAD WITHOUT CONTRAST CT MAXILLOFACIAL WITHOUT CONTRAST CT CERVICAL SPINE WITHOUT CONTRAST TECHNIQUE: Multidetector CT imaging of the head, cervical spine, and maxillofacial structures were performed using the standard protocol without intravenous contrast. Multiplanar CT image reconstructions of the cervical spine and maxillofacial structures were also generated. COMPARISON:  None. FINDINGS: CT HEAD FINDINGS Brain: There is no evidence of acute infarct, intracranial hemorrhage, mass, midline shift, or extra-axial fluid collection. Mild lateral and third ventriculomegaly is favored to reflect  central predominant cerebral atrophy rather than hydrocephalus. Hypodensities in the cerebral white matter bilaterally are nonspecific but compatible with mild chronic small vessel ischemic disease. Vascular: Calcified atherosclerosis at the skull base. No hyperdense vessel. Skull: No fracture or suspicious  osseous lesion. Other: None. CT MAXILLOFACIAL FINDINGS Osseous: No fracture, suspicious osseous lesion, or mandibular dislocation. Orbits: Bilateral cataract extraction. No acute traumatic finding. Sinuses: Paranasal sinuses and mastoid air cells are clear. Soft tissues: Carotid atherosclerosis and right tonsillar calcifications. CT CERVICAL SPINE FINDINGS Alignment: Normal. Skull base and vertebrae: No acute fracture or suspicious osseous lesion. Soft tissues and spinal canal: No prevertebral fluid or swelling. No visible canal hematoma. Disc levels: Mild multilevel cervical disc and facet degeneration without evidence of high-grade spinal stenosis. Moderate bilateral neural foraminal stenosis at C3-4 due to uncovertebral and facet spurring. Upper chest: 3 mm nodule in the posterior right upper lobe. Other: Asymmetric enlargement of the left thyroid lobe; this has been documented on prior studies and biopsied in 2019. IMPRESSION: 1. No evidence of acute intracranial abnormality. 2. Mild chronic small vessel ischemic disease and cerebral atrophy. 3. No acute maxillofacial or cervical spine fracture. Electronically Signed   By: Logan Bores M.D.   On: 12/18/2019 15:44    Procedures Procedures (including critical care time)  Medications Ordered in ED Medications  oxyCODONE (Oxy IR/ROXICODONE) immediate release tablet 5 mg (has no administration in time range)    ED Course  I have reviewed the triage vital signs and the nursing notes.  Pertinent labs & imaging results that were available during my care of the patient were reviewed by me and considered in my medical decision making (see chart for details).    MDM Rules/Calculators/A&P                      Susan Barnett is a 74 year old female with a history of CAD, DM 2, hypertension, hypothyroidism, sleep apnea and GERD who presents with a mechanical fall.  Fall: -pt presenting with ground level mechanical fall without cardiac or presyncopal sx; no  signs of seizures, no LOC but did hit face with trauma to face and chin -this is the third fall in a few weeks in setting of what patient describes as deconditioning and increasing instability in her knees -had shoulder pain from prior fall for which she's followed up with ortho and been diagnosed with tendonitis -now endorsing new facial pain since fall with limited neck ROM secondary to pain but no point tenderness along the spine  -also endorsing some right sided chest pain with deep inspiration with associated point tenderness of the right chest wall -unclear etiology of her falls, could be secondary to deconditioning vs a small prior cerebellar stroke -less concerned for cardiac causes given lack of preceding sx -unlikely orthostatic as patient's BP elevated by EMS and on arrival and per patient when she checks it at home; has been undergoing changes to her blood pressure medication regimen -pain w/ inspiration and chest wall tenderness concerning for rib fracture  Plan: -CBC -CMP -EKG -CT Head WO   -CT C Spine WO -CT Maxillofacial WO -DG Chest 2 View   Final Clinical Impression(s) / ED Diagnoses Final diagnoses:  Fall, initial encounter  Hyponatremia  Weakness   -EKG unremarkable -WBC of 15.8 -NA of 123 -nothing acute on imaging; 3 mm nodule in posterior right lung noted on CT neck  -will admit for additional work up and treatment for hyponatremia and fall  Rx / DC  Orders ED Discharge Orders    None       Al Decant, MD 12/18/19 1648    Carmin Muskrat, MD 12/19/19 [phone]

## 2019-12-19 ENCOUNTER — Inpatient Hospital Stay (HOSPITAL_COMMUNITY): Payer: Medicare Other

## 2019-12-19 DIAGNOSIS — K219 Gastro-esophageal reflux disease without esophagitis: Secondary | ICD-10-CM | POA: Diagnosis present

## 2019-12-19 DIAGNOSIS — M549 Dorsalgia, unspecified: Secondary | ICD-10-CM | POA: Diagnosis present

## 2019-12-19 DIAGNOSIS — Z87892 Personal history of anaphylaxis: Secondary | ICD-10-CM | POA: Diagnosis not present

## 2019-12-19 DIAGNOSIS — G4733 Obstructive sleep apnea (adult) (pediatric): Secondary | ICD-10-CM | POA: Diagnosis present

## 2019-12-19 DIAGNOSIS — R0789 Other chest pain: Secondary | ICD-10-CM | POA: Diagnosis present

## 2019-12-19 DIAGNOSIS — E119 Type 2 diabetes mellitus without complications: Secondary | ICD-10-CM | POA: Diagnosis present

## 2019-12-19 DIAGNOSIS — E782 Mixed hyperlipidemia: Secondary | ICD-10-CM | POA: Diagnosis not present

## 2019-12-19 DIAGNOSIS — Z923 Personal history of irradiation: Secondary | ICD-10-CM | POA: Diagnosis not present

## 2019-12-19 DIAGNOSIS — C50912 Malignant neoplasm of unspecified site of left female breast: Secondary | ICD-10-CM | POA: Diagnosis present

## 2019-12-19 DIAGNOSIS — I2510 Atherosclerotic heart disease of native coronary artery without angina pectoris: Secondary | ICD-10-CM | POA: Diagnosis present

## 2019-12-19 DIAGNOSIS — W010XXA Fall on same level from slipping, tripping and stumbling without subsequent striking against object, initial encounter: Secondary | ICD-10-CM | POA: Diagnosis present

## 2019-12-19 DIAGNOSIS — E039 Hypothyroidism, unspecified: Secondary | ICD-10-CM | POA: Diagnosis present

## 2019-12-19 DIAGNOSIS — Z7902 Long term (current) use of antithrombotics/antiplatelets: Secondary | ICD-10-CM | POA: Diagnosis not present

## 2019-12-19 DIAGNOSIS — Z79811 Long term (current) use of aromatase inhibitors: Secondary | ICD-10-CM | POA: Diagnosis not present

## 2019-12-19 DIAGNOSIS — E871 Hypo-osmolality and hyponatremia: Secondary | ICD-10-CM | POA: Diagnosis present

## 2019-12-19 DIAGNOSIS — Z9181 History of falling: Secondary | ICD-10-CM | POA: Diagnosis not present

## 2019-12-19 DIAGNOSIS — I10 Essential (primary) hypertension: Secondary | ICD-10-CM | POA: Diagnosis present

## 2019-12-19 DIAGNOSIS — E785 Hyperlipidemia, unspecified: Secondary | ICD-10-CM | POA: Diagnosis present

## 2019-12-19 DIAGNOSIS — Z794 Long term (current) use of insulin: Secondary | ICD-10-CM | POA: Diagnosis not present

## 2019-12-19 DIAGNOSIS — Z951 Presence of aortocoronary bypass graft: Secondary | ICD-10-CM | POA: Diagnosis not present

## 2019-12-19 DIAGNOSIS — Z79890 Hormone replacement therapy (postmenopausal): Secondary | ICD-10-CM | POA: Diagnosis not present

## 2019-12-19 DIAGNOSIS — W19XXXA Unspecified fall, initial encounter: Secondary | ICD-10-CM | POA: Diagnosis not present

## 2019-12-19 DIAGNOSIS — M8580 Other specified disorders of bone density and structure, unspecified site: Secondary | ICD-10-CM | POA: Diagnosis present

## 2019-12-19 DIAGNOSIS — M1990 Unspecified osteoarthritis, unspecified site: Secondary | ICD-10-CM | POA: Diagnosis present

## 2019-12-19 DIAGNOSIS — Z79899 Other long term (current) drug therapy: Secondary | ICD-10-CM | POA: Diagnosis not present

## 2019-12-19 DIAGNOSIS — Z8701 Personal history of pneumonia (recurrent): Secondary | ICD-10-CM | POA: Diagnosis not present

## 2019-12-19 DIAGNOSIS — Z20822 Contact with and (suspected) exposure to covid-19: Secondary | ICD-10-CM | POA: Diagnosis present

## 2019-12-19 LAB — CBC
HCT: 34.6 % — ABNORMAL LOW (ref 36.0–46.0)
Hemoglobin: 11.8 g/dL — ABNORMAL LOW (ref 12.0–15.0)
MCH: 29.6 pg (ref 26.0–34.0)
MCHC: 34.1 g/dL (ref 30.0–36.0)
MCV: 86.9 fL (ref 80.0–100.0)
Platelets: 324 10*3/uL (ref 150–400)
RBC: 3.98 MIL/uL (ref 3.87–5.11)
RDW: 13.1 % (ref 11.5–15.5)
WBC: 15.3 10*3/uL — ABNORMAL HIGH (ref 4.0–10.5)
nRBC: 0 % (ref 0.0–0.2)

## 2019-12-19 LAB — BASIC METABOLIC PANEL
Anion gap: 13 (ref 5–15)
BUN: 13 mg/dL (ref 8–23)
CO2: 20 mmol/L — ABNORMAL LOW (ref 22–32)
Calcium: 8.6 mg/dL — ABNORMAL LOW (ref 8.9–10.3)
Chloride: 88 mmol/L — ABNORMAL LOW (ref 98–111)
Creatinine, Ser: 0.57 mg/dL (ref 0.44–1.00)
GFR calc Af Amer: >60 mL/min (ref >60)
GFR calc non Af Amer: >60 mL/min (ref >60)
Glucose, Bld: 196 mg/dL — ABNORMAL HIGH (ref 70–99)
Potassium: 3.6 mmol/L (ref 3.5–5.1)
Sodium: 121 mmol/L — ABNORMAL LOW (ref 135–145)

## 2019-12-19 LAB — URINALYSIS, ROUTINE W REFLEX MICROSCOPIC
Bilirubin Urine: NEGATIVE
Glucose, UA: >=500 mg/dL — AB
Hgb urine dipstick: NEGATIVE
Ketones, ur: NEGATIVE mg/dL
Leukocytes,Ua: NEGATIVE
Nitrite: NEGATIVE
Protein, ur: NEGATIVE mg/dL
Specific Gravity, Urine: 1.011 (ref 1.005–1.030)
pH: 7 (ref 5.0–8.0)

## 2019-12-19 LAB — GLUCOSE, CAPILLARY
Glucose-Capillary: 167 mg/dL — ABNORMAL HIGH (ref 70–99)
Glucose-Capillary: 241 mg/dL — ABNORMAL HIGH (ref 70–99)
Glucose-Capillary: 164 mg/dL — ABNORMAL HIGH (ref 70–99)
Glucose-Capillary: 253 mg/dL — ABNORMAL HIGH (ref 70–99)

## 2019-12-19 LAB — SODIUM
Sodium: 121 mmol/L — ABNORMAL LOW (ref 135–145)
Sodium: 124 mmol/L — ABNORMAL LOW (ref 135–145)

## 2019-12-19 LAB — OSMOLALITY, URINE: Osmolality, Ur: 342 mOsm/kg (ref 300–900)

## 2019-12-19 MED ORDER — ALUM & MAG HYDROXIDE-SIMETH 200-200-20 MG/5ML PO SUSP
15.0000 mL | Freq: Four times a day (QID) | ORAL | Status: DC | PRN
  Administered 2019-12-19: 15 mL via ORAL
  Filled 2019-12-19: qty 30

## 2019-12-19 MED ORDER — INSULIN GLARGINE 100 UNIT/ML ~~LOC~~ SOLN
40.0000 [IU] | Freq: Every day | SUBCUTANEOUS | Status: DC
  Administered 2019-12-19 – 2019-12-20 (×2): 40 [IU] via SUBCUTANEOUS
  Filled 2019-12-19 (×3): qty 0.4

## 2019-12-19 NOTE — Patient Care Conference (Signed)
Called and updated patient's daughter over phone. All questions answered. Per daughter, patient's husband would be the preferred contact person in the future.

## 2019-12-19 NOTE — Progress Notes (Signed)
PROGRESS NOTE    Susan Barnett  [id number] DOB: Sep 27, 1945 DOA: 12/18/2019 PCP: Shirline Frees, MD    Brief Narrative:  74 year old married female, independent of her activities, of CAD, CABG for repeat in-stent stenosis of right coronary, hypertension, IDDM/type II DM, hyperlipidemia, OSA noncompliant with CPAP, left breast cancer s/p left lumpectomy, radiation therapy and on Arimidex, iron deficiency anemia, presented to [facility] ED via EMS following a fall at home.  Patient evaluated along with her friend at bedside.  Patient reported that about noon time today, she was helping her spouse to get the groceries into her house.  The electric vacuum cleaner was heading towards the main door and to prevent it from getting out, she seemed to hurry up a little bit, tripped and fell face forward onto hardwood floor.  She cut her lower lip, hit her chin and anterior chest wall.  No head or neck injury.  No bleeding reported.  No LOC.  No preceding dizziness, lightheadedness, palpitations or chest pain.  At baseline she has difficulty getting up without assistance of holding onto something like furniture.  She was thereby unable to get up and her spouse was unable to help her because of his disabilities.  She called 911.  Currently reports anterior chest wall pain more on the right parasternal area and some back pain.  She denies fevers or chills, dysuria or urinary frequency.  Appetite has been good.  She drinks a lot of sweet tea.  She reports that this was her third fall within a month's time.  The last one was a week ago and the one prior to that was a month ago, all mechanical falls without syncope.  She completed 2 doses of her COVID-19 vaccine several weeks ago.  ED Course: Afebrile, blood pressures marginally high but other vital signs stable.  Lab work significant for sodium 123, chloride 86, glucose 137, WBC 15.8.  Chest x-ray: No active cardiopulmonary disease.  CT head: No evidence  of acute intracranial abnormality.  Mild chronic small vessel ischemic disease and cerebral atrophy.  CT C-spine: No cervical spine fracture.  CT maxillofacial: No acute findings.  Assessment & Plan:   Principal Problem:   Hyponatremia Active Problems:   Hypothyroidism   Type 2 diabetes mellitus with complications (HCC)   Essential hypertension   GERD   Hyperlipidemia   OSA (obstructive sleep apnea)   1. Hyponatremia:  1. Per history, pt admitted to drinking ample amounts of tea prior to admit 2. Urine osm 340's with serum osm low at 264 3. HCTZ on hold. Will also fluid restrict. Agree with holding HCTZ indefinitely given pt's age 25. Follow serial sodium levels 5. Repeat bmet in AM 2. Mechanical falls, recurrent:  1. Imaging reviewed, no fractures or acute pathology noted 2. PT/OT to evaluate 3. CAD s/p CABG:  1. Without chest pains this AM 2. Continue Plavix, statins and metoprolol as tolerated. 4. Essential hypertension:  1. Currently stable and controlled 2. Held HCTZ and Aldactone as per above. 3. Continue amlodipine, Catapres, Cozaar and metoprolol. 5. Type II DM/IDDM:  1. Currently on reduced dose of Lantus, add SSI.   2. Glucose in the 160's to 240's today 3. A1c of 8.4 4. Will continue home dose of 40 units lantus 6. Hyperlipidemia:  1. Continue statins as tolerated 7. OSA:  1. Pt reportedly noted her CPAP machine is ill fitting and has not used it in more than a year.   2. Recommend outpatient follow-up.  8. Left breast cancer status post lumpectomy, radiation:  1. Pt followed by [facility], Dr. Lindi Adie 2. Continue Arimidex as tolerated  DVT prophylaxis: Lovenox subq Code Status: Full Family Communication: Pt in room, family not at bedside  Status is: Observation  The patient will require care spanning > 2 midnights and should be moved to inpatient because: Persistent severe electrolyte disturbances  Dispo: The patient is from: Home               Anticipated d/c is to: Home              Anticipated d/c date is: 2 days              Patient currently is not medically stable to d/c.    Consultants:     Procedures:     Antimicrobials: Anti-infectives (From admission, onward)   None       Subjective: Without complaints, in good spirits  Objective: Vitals:   12/18/19 1825 12/18/19 1858 12/19/19 0257 12/19/19 0914  BP: (!) 143/56 (!) 155/66 (!) 126/58 130/68  Pulse: 63 69 61 68  Resp: 19 18 18 18   Temp:  98.8 F (37.1 C) 98.5 F (36.9 C) 99.2 F (37.3 C)  TempSrc:  Oral Oral Oral  SpO2: 98% 98% 97% 98%  Weight:      Height:        Intake/Output Summary (Last 24 hours) at 12/19/2019 1340 Last data filed at 12/19/2019 0700 Gross per 24 hour  Intake 862.71 ml  Output 900 ml  Net -37.29 ml   Filed Weights   12/18/19 1441  Weight: 72.6 kg    Examination:  General exam: Appears calm and comfortable  Respiratory system: Clear to auscultation. Respiratory effort normal. Cardiovascular system: S1 & S2 heard, Regular Gastrointestinal system: Abdomen is nondistended, soft and nontender. No organomegaly or masses felt. Normal bowel sounds heard. Central nervous system: Alert and oriented. No focal neurological deficits. Extremities: Symmetric 5 x 5 power. Skin: No rashes, lesions multiple areas of ecchymosis Psychiatry: Judgement and insight appear normal. Mood & affect appropriate.   Data Reviewed: I have personally reviewed following labs and imaging studies  CBC: Recent Labs  Lab 12/18/19 1458 12/19/19 0500  WBC 15.8* 15.3*  HGB 13.0 11.8*  HCT 38.9 34.6*  MCV 88.4 86.9  PLT 362 0000000   Basic Metabolic Panel: Recent Labs  Lab 12/18/19 1458 12/18/19 2306 12/19/19 0500 12/19/19 1015  NA 123* 119* 121* 121*  K 4.5 3.7 3.6  --   CL 86* 85* 88*  --   CO2 25 21* 20*  --   GLUCOSE 137* 256* 196*  --   BUN 15 15 13   --   CREATININE 0.73 0.63 0.57  --   CALCIUM 9.7 8.7* 8.6*  --    GFR: Estimated  Creatinine Clearance: 57.6 mL/min (by C-G formula based on SCr of 0.57 mg/dL). Liver Function Tests: Recent Labs  Lab 12/18/19 1458  AST 23  ALT 22  ALKPHOS 63  BILITOT 0.6  PROT 7.5  ALBUMIN 4.2   No results for input(s): LIPASE, AMYLASE in the last 168 hours. No results for input(s): AMMONIA in the last 168 hours. Coagulation Profile: No results for input(s): INR, PROTIME in the last 168 hours. Cardiac Enzymes: No results for input(s): CKTOTAL, CKMB, CKMBINDEX, TROPONINI in the last 168 hours. BNP (last 3 results) No results for input(s): PROBNP in the last 8760 hours. HbA1C: Recent Labs    12/18/19 1902  HGBA1C 8.4*   CBG: Recent Labs  Lab 12/18/19 2122 12/19/19 0747 12/19/19 1127  GLUCAP 194* 167* 241*   Lipid Profile: No results for input(s): CHOL, HDL, LDLCALC, TRIG, CHOLHDL, LDLDIRECT in the last 72 hours. Thyroid Function Tests: Recent Labs    12/18/19 1902  TSH 2.233   Anemia Panel: No results for input(s): VITAMINB12, FOLATE, FERRITIN, TIBC, IRON, RETICCTPCT in the last 72 hours. Sepsis Labs: No results for input(s): PROCALCITON, LATICACIDVEN in the last 168 hours.  Recent Results (from the past 240 hour(s))  Respiratory Panel by RT PCR (Flu A&B, Covid) - Nasopharyngeal Swab     Status: None   Collection Time: 12/18/19  5:18 PM   Specimen: Nasopharyngeal Swab  Result Value Ref Range Status   SARS Coronavirus 2 by RT PCR NEGATIVE NEGATIVE Final    Comment: (NOTE) SARS-CoV-2 target nucleic acids are NOT DETECTED. The SARS-CoV-2 RNA is generally detectable in upper respiratoy specimens during the acute phase of infection. The lowest concentration of SARS-CoV-2 viral copies this assay can detect is 131 copies/mL. A negative result does not preclude SARS-Cov-2 infection and should not be used as the sole basis for treatment or other patient management decisions. A negative result may occur with  improper specimen collection/handling, submission of  specimen other than nasopharyngeal swab, presence of viral mutation(s) within the areas targeted by this assay, and inadequate number of viral copies (<131 copies/mL). A negative result must be combined with clinical observations, patient history, and epidemiological information. The expected result is Negative. Fact Sheet for Patients:  PinkCheek.be Fact Sheet for Healthcare Providers:  GravelBags.it This test is not yet ap proved or cleared by the [place] FDA and  has been authorized for detection and/or diagnosis of SARS-CoV-2 by FDA under an Emergency Use Authorization (EUA). This EUA will remain  in effect (meaning this test can be used) for the duration of the COVID-19 declaration under Section 564(b)(1) of the Act, 21 U.S.C. section 360bbb-3(b)(1), unless the authorization is terminated or revoked sooner.    Influenza A by PCR NEGATIVE NEGATIVE Final   Influenza B by PCR NEGATIVE NEGATIVE Final    Comment: (NOTE) The Xpert Xpress SARS-CoV-2/FLU/RSV assay is intended as an aid in  the diagnosis of influenza from Nasopharyngeal swab specimens and  should not be used as a sole basis for treatment. Nasal washings and  aspirates are unacceptable for Xpert Xpress SARS-CoV-2/FLU/RSV  testing. Fact Sheet for Patients: PinkCheek.be Fact Sheet for Healthcare Providers: GravelBags.it This test is not yet approved or cleared by the [place] FDA and  has been authorized for detection and/or diagnosis of SARS-CoV-2 by  FDA under an Emergency Use Authorization (EUA). This EUA will remain  in effect (meaning this test can be used) for the duration of the  Covid-19 declaration under Section 564(b)(1) of the Act, 21  U.S.C. section 360bbb-3(b)(1), unless the authorization is  terminated or revoked. Performed at [facility], [place] [address], [place],  [place] [address]      Radiology Studies: DG Chest 2 View  Result Date: 12/18/2019 CLINICAL DATA:  Fall.  Pain with inspiration on the right-side. EXAM: CHEST - 2 VIEW COMPARISON:  July 14, 2014 FINDINGS: The heart size and mediastinal contours are within normal limits. Both lungs are clear. The visualized skeletal structures are unremarkable. The patient is status post prior median sternotomy. IMPRESSION: No active cardiopulmonary disease. Electronically Signed   By: Constance Holster M.D.   On: 12/18/2019 15:47   CT  HEAD WO CONTRAST  Result Date: 12/18/2019 CLINICAL DATA:  Fall. Head trauma. Facial bruising. On Plavix. EXAM: CT HEAD WITHOUT CONTRAST CT MAXILLOFACIAL WITHOUT CONTRAST CT CERVICAL SPINE WITHOUT CONTRAST TECHNIQUE: Multidetector CT imaging of the head, cervical spine, and maxillofacial structures were performed using the standard protocol without intravenous contrast. Multiplanar CT image reconstructions of the cervical spine and maxillofacial structures were also generated. COMPARISON:  None. FINDINGS: CT HEAD FINDINGS Brain: There is no evidence of acute infarct, intracranial hemorrhage, mass, midline shift, or extra-axial fluid collection. Mild lateral and third ventriculomegaly is favored to reflect central predominant cerebral atrophy rather than hydrocephalus. Hypodensities in the cerebral white matter bilaterally are nonspecific but compatible with mild chronic small vessel ischemic disease. Vascular: Calcified atherosclerosis at the skull base. No hyperdense vessel. Skull: No fracture or suspicious osseous lesion. Other: None. CT MAXILLOFACIAL FINDINGS Osseous: No fracture, suspicious osseous lesion, or mandibular dislocation. Orbits: Bilateral cataract extraction. No acute traumatic finding. Sinuses: Paranasal sinuses and mastoid air cells are clear. Soft tissues: Carotid atherosclerosis and right tonsillar calcifications. CT CERVICAL SPINE FINDINGS Alignment: Normal. Skull base and  vertebrae: No acute fracture or suspicious osseous lesion. Soft tissues and spinal canal: No prevertebral fluid or swelling. No visible canal hematoma. Disc levels: Mild multilevel cervical disc and facet degeneration without evidence of high-grade spinal stenosis. Moderate bilateral neural foraminal stenosis at C3-4 due to uncovertebral and facet spurring. Upper chest: 3 mm nodule in the posterior right upper lobe. Other: Asymmetric enlargement of the left thyroid lobe; this has been documented on prior studies and biopsied in 2019. IMPRESSION: 1. No evidence of acute intracranial abnormality. 2. Mild chronic small vessel ischemic disease and cerebral atrophy. 3. No acute maxillofacial or cervical spine fracture. Electronically Signed   By: Logan Bores M.D.   On: 12/18/2019 15:44   CT Cervical Spine Wo Contrast  Result Date: 12/18/2019 CLINICAL DATA:  Fall. Head trauma. Facial bruising. On Plavix. EXAM: CT HEAD WITHOUT CONTRAST CT MAXILLOFACIAL WITHOUT CONTRAST CT CERVICAL SPINE WITHOUT CONTRAST TECHNIQUE: Multidetector CT imaging of the head, cervical spine, and maxillofacial structures were performed using the standard protocol without intravenous contrast. Multiplanar CT image reconstructions of the cervical spine and maxillofacial structures were also generated. COMPARISON:  None. FINDINGS: CT HEAD FINDINGS Brain: There is no evidence of acute infarct, intracranial hemorrhage, mass, midline shift, or extra-axial fluid collection. Mild lateral and third ventriculomegaly is favored to reflect central predominant cerebral atrophy rather than hydrocephalus. Hypodensities in the cerebral white matter bilaterally are nonspecific but compatible with mild chronic small vessel ischemic disease. Vascular: Calcified atherosclerosis at the skull base. No hyperdense vessel. Skull: No fracture or suspicious osseous lesion. Other: None. CT MAXILLOFACIAL FINDINGS Osseous: No fracture, suspicious osseous lesion, or  mandibular dislocation. Orbits: Bilateral cataract extraction. No acute traumatic finding. Sinuses: Paranasal sinuses and mastoid air cells are clear. Soft tissues: Carotid atherosclerosis and right tonsillar calcifications. CT CERVICAL SPINE FINDINGS Alignment: Normal. Skull base and vertebrae: No acute fracture or suspicious osseous lesion. Soft tissues and spinal canal: No prevertebral fluid or swelling. No visible canal hematoma. Disc levels: Mild multilevel cervical disc and facet degeneration without evidence of high-grade spinal stenosis. Moderate bilateral neural foraminal stenosis at C3-4 due to uncovertebral and facet spurring. Upper chest: 3 mm nodule in the posterior right upper lobe. Other: Asymmetric enlargement of the left thyroid lobe; this has been documented on prior studies and biopsied in 2019. IMPRESSION: 1. No evidence of acute intracranial abnormality. 2. Mild chronic small vessel ischemic disease and  cerebral atrophy. 3. No acute maxillofacial or cervical spine fracture. Electronically Signed   By: Logan Bores M.D.   On: 12/18/2019 15:44   CT Maxillofacial Wo Contrast  Result Date: 12/18/2019 CLINICAL DATA:  Fall. Head trauma. Facial bruising. On Plavix. EXAM: CT HEAD WITHOUT CONTRAST CT MAXILLOFACIAL WITHOUT CONTRAST CT CERVICAL SPINE WITHOUT CONTRAST TECHNIQUE: Multidetector CT imaging of the head, cervical spine, and maxillofacial structures were performed using the standard protocol without intravenous contrast. Multiplanar CT image reconstructions of the cervical spine and maxillofacial structures were also generated. COMPARISON:  None. FINDINGS: CT HEAD FINDINGS Brain: There is no evidence of acute infarct, intracranial hemorrhage, mass, midline shift, or extra-axial fluid collection. Mild lateral and third ventriculomegaly is favored to reflect central predominant cerebral atrophy rather than hydrocephalus. Hypodensities in the cerebral white matter bilaterally are nonspecific but  compatible with mild chronic small vessel ischemic disease. Vascular: Calcified atherosclerosis at the skull base. No hyperdense vessel. Skull: No fracture or suspicious osseous lesion. Other: None. CT MAXILLOFACIAL FINDINGS Osseous: No fracture, suspicious osseous lesion, or mandibular dislocation. Orbits: Bilateral cataract extraction. No acute traumatic finding. Sinuses: Paranasal sinuses and mastoid air cells are clear. Soft tissues: Carotid atherosclerosis and right tonsillar calcifications. CT CERVICAL SPINE FINDINGS Alignment: Normal. Skull base and vertebrae: No acute fracture or suspicious osseous lesion. Soft tissues and spinal canal: No prevertebral fluid or swelling. No visible canal hematoma. Disc levels: Mild multilevel cervical disc and facet degeneration without evidence of high-grade spinal stenosis. Moderate bilateral neural foraminal stenosis at C3-4 due to uncovertebral and facet spurring. Upper chest: 3 mm nodule in the posterior right upper lobe. Other: Asymmetric enlargement of the left thyroid lobe; this has been documented on prior studies and biopsied in 2019. IMPRESSION: 1. No evidence of acute intracranial abnormality. 2. Mild chronic small vessel ischemic disease and cerebral atrophy. 3. No acute maxillofacial or cervical spine fracture. Electronically Signed   By: Logan Bores M.D.   On: 12/18/2019 15:44    Scheduled Meds: . amLODipine  5 mg Oral Daily  . anastrozole  1 mg Oral Daily  . atorvastatin  10 mg Oral QPM  . cloNIDine  0.1 mg Oral BID  . clopidogrel  75 mg Oral Daily  . enoxaparin (LOVENOX) injection  40 mg Subcutaneous Q24H  . insulin aspart  0-5 Units Subcutaneous QHS  . insulin aspart  0-9 Units Subcutaneous TID WC  . insulin glargine  35 Units Subcutaneous QHS  . levothyroxine  75 mcg Oral Q0600  . loratadine  10 mg Oral Daily  . losartan  100 mg Oral Daily  . metoprolol tartrate  50 mg Oral BID  . pantoprazole  40 mg Oral QAC breakfast  . sodium chloride  flush  3 mL Intravenous Q12H   Continuous Infusions:   LOS: 0 days   Marylu Lund, MD [facility] Pager On Amion  If 7PM-7AM, please contact night-coverage 12/19/2019, 1:40 PM

## 2019-12-19 NOTE — Evaluation (Signed)
Occupational Therapy Evaluation Patient Details Name: Susan Barnett MRN: [id number] DOB: Feb 21, 1946 Today's Date: 12/19/2019    History of Present Illness Susan Barnett is a 74 yo female with PMH: CAD, CABG, hypertension, IDDM/type II DM, hyperlipidemia, OSA noncompliant with CPAP, left breast cancer s/p left lumpectomy, radiation therapy, iron deficiency anemia, presented to [facility] ED via EMS following a fall at home. Imaging neg for acute fx. Pt reports 3 falls in past month.    Clinical Impression   This 74 y/o female presents with the above. PTA pt reports independence with ADL, iADL and functional mobility. Pt currently presenting with unsteadiness with standing/mobility, overall weakness and decreased activity tolerance. Pt currently requiring minA for functional mobility using RW (both room/hallway); requiring minA for LB and toileting ADL, setup assist for seated UB ADL. Pt reports lives at home with spouse. She will benefit from continued acute OT services and currently recommend follow up [facility] services after discharge to maximize her overall safety and independence with ADL and mobility. Will follow.     Follow Up Recommendations  Home health OT;Supervision/Assistance - 24 hour    Equipment Recommendations  None recommended by OT           Precautions / Restrictions Precautions Precautions: Fall Precaution Comments: 3 falls in past month Restrictions Weight Bearing Restrictions: No      Mobility Bed Mobility               General bed mobility comments: seated EOB upon arrival  Transfers Overall transfer level: Needs assistance Equipment used: Rolling walker (2 wheeled);None Transfers: Sit to/from Stand Sit to Stand: Min guard;Min assist         General transfer comment: minguard from EOB, completing x2; increased assist from lower surface height of toilet; VCs for safe hand placement    Balance Overall balance assessment: Needs  assistance;History of Falls Sitting-balance support: Feet supported Sitting balance-Leahy Scale: Good     Standing balance support: Bilateral upper extremity supported;No upper extremity supported;During functional activity Standing balance-Leahy Scale: Fair Standing balance comment: able to static stand to wash hands with minguard assist                           ADL either performed or assessed with clinical judgement   ADL Overall ADL's : Needs assistance/impaired Eating/Feeding: Modified independent;Sitting Eating/Feeding Details (indicate cue type and reason): finishing breakfast upon arrival Grooming: Wash/dry hands;Min guard;Standing   Upper Body Bathing: Set up;Supervision/ safety;Sitting   Lower Body Bathing: Minimal assistance;Sit to/from stand   Upper Body Dressing : Set up;Sitting   Lower Body Dressing: Minimal assistance;Sit to/from stand   Toilet Transfer: Minimal assistance;Ambulation;RW;Regular Toilet;Grab bars   Toileting- Clothing Manipulation and Hygiene: Minimal assistance;Sit to/from stand Toileting - Clothing Manipulation Details (indicate cue type and reason): assist for balance while pt performing clothing management   Tub/Shower Transfer Details (indicate cue type and reason): discussed possibly needing to use shower seat for increased safety Functional mobility during ADLs: Minimal assistance;Rolling walker General ADL Comments: pt with unsteadiness without AD, generalized weakness and decreased activity tolerance     Vision   Vision Assessment?: No apparent visual deficits     Perception     Praxis      Pertinent Vitals/Pain Pain Assessment: Faces Faces Pain Scale: Hurts little more Pain Location: generalized, R shoulder/ribcage with certain movements Pain Descriptors / Indicators: Discomfort;Sore Pain Intervention(s): Limited activity within patient's tolerance;Monitored during session;Repositioned  Hand Dominance Right    Extremity/Trunk Assessment Upper Extremity Assessment Upper Extremity Assessment: RUE deficits/detail RUE Deficits / Details: effortful ROM due to shoulder stiffness/soreness from recent fall  RUE: Unable to fully assess due to pain RUE Coordination: decreased gross motor   Lower Extremity Assessment Lower Extremity Assessment: Defer to PT evaluation       Communication Communication Communication: No difficulties   Cognition Arousal/Alertness: Awake/alert Behavior During Therapy: WFL for tasks assessed/performed Overall Cognitive Status: Within Functional Limits for tasks assessed                                     General Comments  VSS on RA    Exercises     Shoulder Instructions      Home Living Family/patient expects to be discharged to:: Private residence Living Arrangements: Spouse/significant other Available Help at Discharge: Family Type of Home: House Home Access: Stairs to enter [employer] of Steps: 2 onto porch and then 1 into home Entrance Stairs-Rails: Right;Left Home Layout: One level     Bathroom Shower/Tub: Tub/shower unit;Walk-in shower(uses walk-in)   Bathroom Toilet: (comfort height)     Home Equipment: Shower seat - built in          Prior Functioning/Environment Level of Independence: Independent                 OT Problem List: Decreased strength;Decreased range of motion;Decreased activity tolerance;Impaired balance (sitting and/or standing);Decreased safety awareness;Decreased knowledge of use of DME or AE      OT Treatment/Interventions: Self-care/ADL training;Therapeutic exercise;Energy conservation;DME and/or AE instruction;Therapeutic activities;Patient/family education;Balance training    OT Goals(Current goals can be found in the care plan section) Acute Rehab OT Goals Patient Stated Goal: regain her independence, no more falls  OT Goal Formulation: With patient Time For Goal Achievement:  01/02/20 Potential to Achieve Goals: Good  OT Frequency: Min 2X/week   Barriers to D/C:            Co-evaluation PT/OT/SLP Co-Evaluation/Treatment: Yes Reason for Co-Treatment: For patient/therapist safety;To address functional/ADL transfers   OT goals addressed during session: ADL's and self-care      AM-PAC OT "6 Clicks" Daily Activity     Outcome Measure Help from another person eating meals?: None Help from another person taking care of personal grooming?: A Little Help from another person toileting, which includes using toliet, bedpan, or urinal?: A Little Help from another person bathing (including washing, rinsing, drying)?: A Little Help from another person to put on and taking off regular upper body clothing?: A Little Help from another person to put on and taking off regular lower body clothing?: A Little 6 Click Score: 19   End of Session Equipment Utilized During Treatment: Gait belt;Rolling walker Nurse Communication: Mobility status  Activity Tolerance: Patient tolerated treatment well Patient left: in chair;with call bell/phone within reach;with chair alarm set  OT Visit Diagnosis: Muscle weakness (generalized) (M62.81);Unsteadiness on feet (R26.81);History of falling (Z91.81)                Time: [id number] OT Time Calculation (min): 37 min Charges:  OT General Charges $OT Visit: 1 Visit OT Evaluation $OT Eval Moderate Complexity: [place], OT Acute Rehabilitation Services Pager [phone] Office [phone]   Raymondo Band 12/19/2019, 9:28 AM

## 2019-12-19 NOTE — Evaluation (Signed)
Physical Therapy Evaluation Patient Details Name: Susan Barnett MRN: [id number] DOB: 11/25/1945 Today's Date: 12/19/2019   History of Present Illness  Susan Barnett is a 74 yo female with PMH: CAD, CABG, hypertension, IDDM/type II DM, hyperlipidemia, OSA noncompliant with CPAP, left breast cancer s/p left lumpectomy, radiation therapy, iron deficiency anemia, presented to [facility] ED via EMS following a fall at home. Imaging neg for acute fx. Pt reports 3 falls in past month.   Clinical Impression  Pt admitted with above diagnosis. Pt presents with recent falls from catching toe on surfaces. She relays knee discomfort from OA which is affecting her step height but she is also tight B heel cords which is decreasing her df during gait. Educated pt on this and gave her heel cord stretches to be performed in sitting with a sheet. Pt would benefit from HHPT for balance program and she is agreeable.  Pt currently with functional limitations due to the deficits listed below (see PT Problem List). Pt will benefit from skilled PT to increase their independence and safety with mobility to allow discharge to the venue listed below.       Follow Up Recommendations Home health PT;Supervision for mobility/OOB    Equipment Recommendations  Rolling walker with 5" wheels    Recommendations for Other Services       Precautions / Restrictions Precautions Precautions: Fall Precaution Comments: 3 falls in past month Restrictions Weight Bearing Restrictions: No      Mobility  Bed Mobility               General bed mobility comments: seated EOB upon arrival  Transfers Overall transfer level: Needs assistance Equipment used: Rolling walker (2 wheeled);None Transfers: Sit to/from Stand Sit to Stand: Min guard         General transfer comment: min-guard from EOB with vc's for hand placement as well as fwd wt shift. Pt struggles with sit<>stand because she cannot slide feet under  her due to tight heel cords but then has to wt shift more than she feels comfortable with due to insecurity from recent falls.   Ambulation/Gait Ambulation/Gait assistance: Min assist Gait Distance (Feet): 100 Feet Assistive device: Rolling walker (2 wheeled);1 person hand held assist Gait Pattern/deviations: Step-through pattern;Decreased stride length;Shuffle Gait velocity: decreased Gait velocity interpretation: <1.8 ft/sec, indicate of risk for recurrent falls General Gait Details: Required HHA without AD, unsteady. Given RW and was able to ambluate with min-guard during straight gait and min A with turning and obstacle navigation. Decreased step ht noted during gait  Stairs            Wheelchair Mobility    Modified Rankin (Stroke Patients Only)       Balance Overall balance assessment: Needs assistance;History of Falls Sitting-balance support: Feet supported Sitting balance-Leahy Scale: Good     Standing balance support: Bilateral upper extremity supported;No upper extremity supported;During functional activity Standing balance-Leahy Scale: Fair Standing balance comment: posterior bias in standing                             Pertinent Vitals/Pain Pain Assessment: Faces Faces Pain Scale: Hurts little more Pain Location: generalized, R shoulder/ribcage with certain movements Pain Descriptors / Indicators: Discomfort;Sore Pain Intervention(s): Limited activity within patient's tolerance;Monitored during session    Susan Barnett expects to be discharged to:: Private residence Living Arrangements: Spouse/significant other Available Help at Discharge: Family Type of Home: [employer]  Access: Stairs to enter Entrance Stairs-Rails: Psychiatric nurse of Steps: 2 onto porch and then 1 into home Home Layout: One level Home Equipment: Shower seat - built in Additional Comments: pt lives with husband but he is disabled from  advanced peripheral neuropathy    Prior Function Level of Independence: Independent               Hand Dominance   Dominant Hand: Right    Extremity/Trunk Assessment   Upper Extremity Assessment Upper Extremity Assessment: Defer to OT evaluation RUE Deficits / Details: effortful ROM due to shoulder stiffness/soreness from recent fall  RUE: Unable to fully assess due to pain RUE Coordination: decreased gross motor    Lower Extremity Assessment Lower Extremity Assessment: RLE deficits/detail;LLE deficits/detail RLE Deficits / Details: pt with B knee OA, hip flex 4-/5, knee ext 4/5, knee flex 4+/5, pt with very tight gastroc and limited ankle df RLE Sensation: history of peripheral neuropathy;decreased proprioception RLE Coordination: decreased fine motor LLE Deficits / Details: same as RLE LLE Sensation: decreased proprioception;history of peripheral neuropathy LLE Coordination: decreased fine motor    Cervical / Trunk Assessment Cervical / Trunk Assessment: Kyphotic  Communication   Communication: No difficulties  Cognition Arousal/Alertness: Awake/alert Behavior During Therapy: WFL for tasks assessed/performed Overall Cognitive Status: Within Functional Limits for tasks assessed                                        General Comments General comments (skin integrity, edema, etc.): VSS on RA, noted 2/4 DOE but SpO2 98% and HR 81 bpm    Exercises Other Exercises Other Exercises: heel cord stretch with sheet, 30 sec R and 30 secs L   Assessment/Plan    PT Assessment Patient needs continued PT services  PT Problem List Decreased strength;Decreased activity tolerance;Decreased balance;Decreased range of motion;Decreased mobility;Decreased coordination;Decreased knowledge of use of DME;Decreased knowledge of precautions;Pain       PT Treatment Interventions DME instruction;Gait training;Stair training;Functional mobility training;Therapeutic  activities;Therapeutic exercise;Balance training;Patient/family education    PT Goals (Current goals can be found in the Care Plan section)  Acute Rehab PT Goals Patient Stated Goal: regain her independence, no more falls  PT Goal Formulation: With patient Time For Goal Achievement: 01/02/20 Potential to Achieve Goals: Good    Frequency Min 3X/week   Barriers to discharge        Co-evaluation PT/OT/SLP Co-Evaluation/Treatment: Yes Reason for Co-Treatment: For patient/therapist safety PT goals addressed during session: Mobility/safety with mobility;Balance;Proper use of DME;Strengthening/ROM OT goals addressed during session: ADL's and self-care       AM-PAC PT "6 Clicks" Mobility  Outcome Measure Help needed turning from your back to your side while in a flat bed without using bedrails?: None Help needed moving from lying on your back to sitting on the side of a flat bed without using bedrails?: None Help needed moving to and from a bed to a chair (including a wheelchair)?: A Little Help needed standing up from a chair using your arms (e.g., wheelchair or bedside chair)?: A Little Help needed to walk in hospital room?: A Little Help needed climbing 3-5 steps with a railing? : A Little 6 Click Score: 20    End of Session Equipment Utilized During Treatment: Gait belt Activity Tolerance: Patient tolerated treatment well Patient left: in chair;with call bell/phone within reach;with chair alarm set Nurse Communication: Mobility status PT Visit  Diagnosis: Muscle weakness (generalized) (M62.81);History of falling (Z91.81);Repeated falls (R29.6);Difficulty in walking, not elsewhere classified (R26.2);Pain;Unsteadiness on feet (R26.81) Pain - Right/Left: Right Pain - part of body: Shoulder    Time: [id number] PT Time Calculation (min) (ACUTE ONLY): 37 min   Charges:   PT Evaluation $PT Eval Moderate Complexity: [place]   Pager [phone] Office [place] 12/19/2019, 11:15 AM

## 2019-12-19 NOTE — Plan of Care (Signed)
  Problem: Education: Goal: Knowledge of General Education information will improve Description Including pain rating scale, medication(s)/side effects and non-pharmacologic comfort measures Outcome: Progressing   

## 2019-12-20 LAB — COMPREHENSIVE METABOLIC PANEL
ALT: 18 U/L (ref 0–44)
AST: 18 U/L (ref 15–41)
Albumin: 3.6 g/dL (ref 3.5–5.0)
Alkaline Phosphatase: 59 U/L (ref 38–126)
Anion gap: 13 (ref 5–15)
BUN: 13 mg/dL (ref 8–23)
CO2: 23 mmol/L (ref 22–32)
Calcium: 9.1 mg/dL (ref 8.9–10.3)
Chloride: 90 mmol/L — ABNORMAL LOW (ref 98–111)
Creatinine, Ser: 0.51 mg/dL (ref 0.44–1.00)
GFR calc Af Amer: >60 mL/min (ref >60)
GFR calc non Af Amer: >60 mL/min (ref >60)
Glucose, Bld: 126 mg/dL — ABNORMAL HIGH (ref 70–99)
Potassium: 3.5 mmol/L (ref 3.5–5.1)
Sodium: 126 mmol/L — ABNORMAL LOW (ref 135–145)
Total Bilirubin: 0.6 mg/dL (ref 0.3–1.2)
Total Protein: 7 g/dL (ref 6.5–8.1)

## 2019-12-20 LAB — CBC
HCT: 36.3 % (ref 36.0–46.0)
Hemoglobin: 12.6 g/dL (ref 12.0–15.0)
MCH: 29.9 pg (ref 26.0–34.0)
MCHC: 34.7 g/dL (ref 30.0–36.0)
MCV: 86 fL (ref 80.0–100.0)
Platelets: 328 10*3/uL (ref 150–400)
RBC: 4.22 MIL/uL (ref 3.87–5.11)
RDW: 13.2 % (ref 11.5–15.5)
WBC: 11.9 10*3/uL — ABNORMAL HIGH (ref 4.0–10.5)
nRBC: 0 % (ref 0.0–0.2)

## 2019-12-20 LAB — GLUCOSE, CAPILLARY
Glucose-Capillary: 164 mg/dL — ABNORMAL HIGH (ref 70–99)
Glucose-Capillary: 201 mg/dL — ABNORMAL HIGH (ref 70–99)
Glucose-Capillary: 204 mg/dL — ABNORMAL HIGH (ref 70–99)
Glucose-Capillary: 165 mg/dL — ABNORMAL HIGH (ref 70–99)

## 2019-12-20 MED ORDER — ONDANSETRON HCL 4 MG/2ML IJ SOLN: 4.0000 mg | Freq: Four times a day (QID) | INTRAMUSCULAR | Status: DC | PRN

## 2019-12-20 NOTE — Patient Care Conference (Signed)
Tried to call patient's spouse at all numbers listed. No answer. Next, called and updated pt's daughter who is listed as emergency contact. Updates given. Will follow

## 2019-12-20 NOTE — Progress Notes (Signed)
Occupational Therapy Treatment Patient Details Name: Susan Barnett MRN: [id number] DOB: [birth year] Today's Date: 12/20/2019    History of present illness Susan Barnett is a 74 yo female with PMH: CAD, CABG, hypertension, IDDM/type II DM, hyperlipidemia, OSA noncompliant with CPAP, left breast cancer s/p left lumpectomy, radiation therapy, iron deficiency anemia, presented to [facility] ED via EMS following a fall at home. Imaging neg for acute fx. Pt reports 3 falls in past month.    OT comments  Pt presents seated in recliner pleasant and eager to work with therapy. Pt progressing to supervision level for completion of room level mobility using RW and standing grooming ADL. Pt requiring light minA for toileting task to assist with gowns only. Overall pt tolerating session well including prolonged standing activity at sink. She continues to have some limitations due to general pain s/p falls but anticipate should progress well as pain subsides. Recommend continue per POC at this time.   Follow Up Recommendations  Home health OT;Supervision/Assistance - 24 hour    Equipment Recommendations  None recommended by OT          Precautions / Restrictions Precautions Precautions: Fall Precaution Comments: 3 falls in past month Restrictions Weight Bearing Restrictions: No       Mobility Bed Mobility Overal bed mobility: Needs Assistance Bed Mobility: Sit to Supine       Sit to supine: Supervision   General bed mobility comments: for safety, increased time/effort; transitioning to flat bed without bedrail  Transfers Overall transfer level: Needs assistance Equipment used: Rolling walker (2 wheeled);None Transfers: Sit to/from Stand Sit to Stand: Min guard         General transfer comment: for safety and balance, min VCs for safe hand placement when returning to sitting     Balance Overall balance assessment: Needs assistance;History of Falls Sitting-balance  support: Feet supported Sitting balance-Leahy Scale: Good     Standing balance support: Bilateral upper extremity supported;No upper extremity supported;During functional activity Standing balance-Leahy Scale: Fair                             ADL either performed or assessed with clinical judgement   ADL Overall ADL's : Needs assistance/impaired Eating/Feeding: Modified independent;Sitting   Grooming: Wash/dry hands;Wash/dry face;Oral care;Supervision/safety;Standing                   Toilet Transfer: Supervision/safety;Ambulation;RW;BSC Toilet Transfer Details (indicate cue type and reason): BSC over toilet Toileting- Clothing Manipulation and Hygiene: Supervision/safety;Sitting/lateral lean;Minimal assistance;Sit to/from stand Toileting - Clothing Manipulation Details (indicate cue type and reason): assist only for management of gowns during pericare      Functional mobility during ADLs: Supervision/safety;Rolling walker                         Cognition Arousal/Alertness: Awake/alert Behavior During Therapy: WFL for tasks assessed/performed Overall Cognitive Status: Within Functional Limits for tasks assessed                                          Exercises Exercises: Other exercises Other Exercises Other Exercises: scapular retract and hold x3-5 sec, x 5 reps Other Exercises: lap slides into shoulder flexion within pt pain limitation , x5   Shoulder Instructions       General Comments  Pertinent Vitals/ Pain       Pain Assessment: Faces Faces Pain Scale: Hurts little more Pain Location: generalized, R shoulder/ribcage with certain movements Pain Descriptors / Indicators: Discomfort;Sore Pain Intervention(s): Limited activity within patient's tolerance;Monitored during session;Repositioned  Home Living                                          Prior Functioning/Environment               Frequency  Min 2X/week        Progress Toward Goals  OT Goals(current goals can now be found in the care plan section)  Progress towards OT goals: Progressing toward goals  Acute Rehab OT Goals Patient Stated Goal: regain her independence, no more falls  OT Goal Formulation: With patient Time For Goal Achievement: 01/02/20 Potential to Achieve Goals: Good ADL Goals Pt Will Perform Grooming: with modified independence;standing Pt Will Perform Lower Body Bathing: with modified independence;sit to/from stand Pt Will Perform Lower Body Dressing: with modified independence;sit to/from stand Pt Will Transfer to Toilet: with modified independence;ambulating Pt Will Perform Toileting - Clothing Manipulation and hygiene: with modified independence;sit to/from stand Additional ADL Goal #1: Pt will identify at least 3 ways to reduce risk for falls.  Plan Discharge plan remains appropriate    Co-evaluation                 AM-PAC OT "6 Clicks" Daily Activity     Outcome Measure   Help from another person eating meals?: None Help from another person taking care of personal grooming?: A Little Help from another person toileting, which includes using toliet, bedpan, or urinal?: A Little Help from another person bathing (including washing, rinsing, drying)?: A Little Help from another person to put on and taking off regular upper body clothing?: A Little Help from another person to put on and taking off regular lower body clothing?: A Little 6 Click Score: 19    End of Session Equipment Utilized During Treatment: Gait belt;Rolling walker  OT Visit Diagnosis: Muscle weakness (generalized) (M62.81);Unsteadiness on feet (R26.81);History of falling (Z91.81)   Activity Tolerance Patient tolerated treatment well   Patient Left in bed;with call bell/phone within reach;with bed alarm set   Nurse Communication Mobility status        Time: [id number] OT Time Calculation (min):  33 min  Charges: OT General Charges $OT Visit: 1 Visit OT Treatments $Self Care/Home Management : 23-37 mins  Susan Barnett, OT Acute Rehabilitation Services Pager [phone] Office [place] 12/20/2019, 10:19 AM

## 2019-12-20 NOTE — Progress Notes (Signed)
PROGRESS NOTE    Susan Barnett  [id number] DOB: 05/07/1946 DOA: 12/18/2019 PCP: Shirline Frees, MD    Brief Narrative:  74 year old married female, independent of her activities, of CAD, CABG for repeat in-stent stenosis of right coronary, hypertension, IDDM/type II DM, hyperlipidemia, OSA noncompliant with CPAP, left breast cancer s/p left lumpectomy, radiation therapy and on Arimidex, iron deficiency anemia, presented to [facility] ED via EMS following a fall at home.  Patient evaluated along with her friend at bedside.  Patient reported that about noon time today, she was helping her spouse to get the groceries into her house.  The electric vacuum cleaner was heading towards the main door and to prevent it from getting out, she seemed to hurry up a little bit, tripped and fell face forward onto hardwood floor.  She cut her lower lip, hit her chin and anterior chest wall.  No head or neck injury.  No bleeding reported.  No LOC.  No preceding dizziness, lightheadedness, palpitations or chest pain.  At baseline she has difficulty getting up without assistance of holding onto something like furniture.  She was thereby unable to get up and her spouse was unable to help her because of his disabilities.  She called 911.  Currently reports anterior chest wall pain more on the right parasternal area and some back pain.  She denies fevers or chills, dysuria or urinary frequency.  Appetite has been good.  She drinks a lot of sweet tea.  She reports that this was her third fall within a month's time.  The last one was a week ago and the one prior to that was a month ago, all mechanical falls without syncope.  She completed 2 doses of her COVID-19 vaccine several weeks ago.  ED Course: Afebrile, blood pressures marginally high but other vital signs stable.  Lab work significant for sodium 123, chloride 86, glucose 137, WBC 15.8.  Chest x-ray: No active cardiopulmonary disease.  CT head: No evidence  of acute intracranial abnormality.  Mild chronic small vessel ischemic disease and cerebral atrophy.  CT C-spine: No cervical spine fracture.  CT maxillofacial: No acute findings.  Assessment & Plan:   Principal Problem:   Hyponatremia Active Problems:   Hypothyroidism   Type 2 diabetes mellitus with complications (HCC)   Essential hypertension   GERD   Hyperlipidemia   OSA (obstructive sleep apnea)   1. Hyponatremia:  1. Per history, pt admitted to drinking ample amounts of tea prior to admit 2. Urine osm 340's with serum osm low at 264 3. HCTZ on hold. Cont fluid restriction.  4. Sodium levels are trending up 5. Repeat bmet in AM 2. Mechanical falls, recurrent:  1. Imaging reviewed, no fractures or acute pathology noted 2. PT/OT recs for home PT/OT noted 3. CAD s/p CABG:  1. Without chest pains this AM 2. Continue Plavix, statins and metoprolol as tolerated. 4. Essential hypertension:  1. Currently stable and controlled 2. Held HCTZ and Aldactone as per above. 3. Continue amlodipine, Catapres, Cozaar and metoprolol as tolerated 5. Type II DM/IDDM:  1. Glucose in the 160's to 240's today 2. A1c of 8.4 noted 3. Continued on home dose of 40 units lantus 4. Glucose in the 160-200's. Will monitor for now 6. Hyperlipidemia:  1. Continue statins as pt tolerates 7. OSA:  1. Pt reportedly noted her CPAP machine is ill fitting and has not used it in more than a year.   2. Recommend outpatient follow-up after d/c  8. Left breast cancer status post lumpectomy, radiation:  1. Pt followed by [facility], Dr. Lindi Adie 2. Continue Arimidex as pt tolerates  DVT prophylaxis: Lovenox subq Code Status: Full Family Communication: Pt in room, family not at bedside  Status is: Observation  The patient will require care spanning > 2 midnights and should be moved to inpatient because: Persistent severe electrolyte disturbances  Dispo: The patient is from: Home              Anticipated  d/c is to: Home              Anticipated d/c date is: 2 days              Patient currently is not medically stable to d/c.   Consultants:     Procedures:     Antimicrobials: Anti-infectives (From admission, onward)   None      Subjective: Reports feeling better. Eager to go home  Objective: Vitals:   12/19/19 1739 12/19/19 2035 12/20/19 0545 12/20/19 0907  BP:  (!) 141/54 140/60 (!) 138/53  Pulse:  67 61 71  Resp:  16 18   Temp: 98.8 F (37.1 C) 99.1 F (37.3 C) 98.2 F (36.8 C)   TempSrc:  Oral Oral   SpO2: 96% 96% 98% 97%  Weight:  73 kg    Height:        Intake/Output Summary (Last 24 hours) at 12/20/2019 1457 Last data filed at 12/20/2019 0600 Gross per 24 hour  Intake 240 ml  Output 650 ml  Net -410 ml   Filed Weights   12/18/19 1441 12/19/19 2035  Weight: 72.6 kg 73 kg    Examination: General exam: Awake, laying in bed, in nad Respiratory system: Normal respiratory effort, no wheezing Cardiovascular system: regular rate, s1, s2 Gastrointestinal system: Soft, nondistended, positive BS Central nervous system: CN2-12 grossly intact, strength intact Extremities: Perfused, no clubbing Skin: Normal skin turgor, no notable skin lesions seen Psychiatry: Mood normal // no visual hallucinations   Data Reviewed: I have personally reviewed following labs and imaging studies  CBC: Recent Labs  Lab 12/18/19 1458 12/19/19 0500 12/20/19 0321  WBC 15.8* 15.3* 11.9*  HGB 13.0 11.8* 12.6  HCT 38.9 34.6* 36.3  MCV 88.4 86.9 86.0  PLT 362 324 [id number]   Basic Metabolic Panel: Recent Labs  Lab 12/18/19 1458 12/18/19 1458 12/18/19 2306 12/19/19 0500 12/19/19 1015 12/19/19 1549 12/20/19 0321  NA 123*   < > 119* 121* 121* 124* 126*  K 4.5  --  3.7 3.6  --   --  3.5  CL 86*  --  85* 88*  --   --  90*  CO2 25  --  21* 20*  --   --  23  GLUCOSE 137*  --  256* 196*  --   --  126*  BUN 15  --  15 13  --   --  13  CREATININE 0.73  --  0.63 0.57  --   --  0.51    CALCIUM 9.7  --  8.7* 8.6*  --   --  9.1   < > = values in this interval not displayed.   GFR: Estimated Creatinine Clearance: 57.8 mL/min (by C-G formula based on SCr of 0.51 mg/dL). Liver Function Tests: Recent Labs  Lab 12/18/19 1458 12/20/19 0321  AST 23 18  ALT 22 18  ALKPHOS 63 59  BILITOT 0.6 0.6  PROT 7.5 7.0  ALBUMIN 4.2 3.6  No results for input(s): LIPASE, AMYLASE in the last 168 hours. No results for input(s): AMMONIA in the last 168 hours. Coagulation Profile: No results for input(s): INR, PROTIME in the last 168 hours. Cardiac Enzymes: No results for input(s): CKTOTAL, CKMB, CKMBINDEX, TROPONINI in the last 168 hours. BNP (last 3 results) No results for input(s): PROBNP in the last 8760 hours. HbA1C: Recent Labs    12/18/19 1902  HGBA1C 8.4*   CBG: Recent Labs  Lab 12/19/19 1127 12/19/19 1752 12/19/19 2037 12/20/19 0712 12/20/19 1153  GLUCAP 241* 164* 253* 164* 201*   Lipid Profile: No results for input(s): CHOL, HDL, LDLCALC, TRIG, CHOLHDL, LDLDIRECT in the last 72 hours. Thyroid Function Tests: Recent Labs    12/18/19 1902  TSH 2.233   Anemia Panel: No results for input(s): VITAMINB12, FOLATE, FERRITIN, TIBC, IRON, RETICCTPCT in the last 72 hours. Sepsis Labs: No results for input(s): PROCALCITON, LATICACIDVEN in the last 168 hours.  Recent Results (from the past 240 hour(s))  Respiratory Panel by RT PCR (Flu A&B, Covid) - Nasopharyngeal Swab     Status: None   Collection Time: 12/18/19  5:18 PM   Specimen: Nasopharyngeal Swab  Result Value Ref Range Status   SARS Coronavirus 2 by RT PCR NEGATIVE NEGATIVE Final    Comment: (NOTE) SARS-CoV-2 target nucleic acids are NOT DETECTED. The SARS-CoV-2 RNA is generally detectable in upper respiratoy specimens during the acute phase of infection. The lowest concentration of SARS-CoV-2 viral copies this assay can detect is 131 copies/mL. A negative result does not preclude SARS-Cov-2 infection  and should not be used as the sole basis for treatment or other patient management decisions. A negative result may occur with  improper specimen collection/handling, submission of specimen other than nasopharyngeal swab, presence of viral mutation(s) within the areas targeted by this assay, and inadequate number of viral copies (<131 copies/mL). A negative result must be combined with clinical observations, patient history, and epidemiological information. The expected result is Negative. Fact Sheet for Patients:  PinkCheek.be Fact Sheet for Healthcare Providers:  GravelBags.it This test is not yet ap proved or cleared by the [place] FDA and  has been authorized for detection and/or diagnosis of SARS-CoV-2 by FDA under an Emergency Use Authorization (EUA). This EUA will remain  in effect (meaning this test can be used) for the duration of the COVID-19 declaration under Section 564(b)(1) of the Act, 21 U.S.C. section 360bbb-3(b)(1), unless the authorization is terminated or revoked sooner.    Influenza A by PCR NEGATIVE NEGATIVE Final   Influenza B by PCR NEGATIVE NEGATIVE Final    Comment: (NOTE) The Xpert Xpress SARS-CoV-2/FLU/RSV assay is intended as an aid in  the diagnosis of influenza from Nasopharyngeal swab specimens and  should not be used as a sole basis for treatment. Nasal washings and  aspirates are unacceptable for Xpert Xpress SARS-CoV-2/FLU/RSV  testing. Fact Sheet for Patients: PinkCheek.be Fact Sheet for Healthcare Providers: GravelBags.it This test is not yet approved or cleared by the [place] FDA and  has been authorized for detection and/or diagnosis of SARS-CoV-2 by  FDA under an Emergency Use Authorization (EUA). This EUA will remain  in effect (meaning this test can be used) for the duration of the  Covid-19 declaration under Section  564(b)(1) of the Act, 21  U.S.C. section 360bbb-3(b)(1), unless the authorization is  terminated or revoked. Performed at [facility], [place] [address] [place], [place] [address]      Radiology Studies: DG Chest  2 View  Result Date: 12/18/2019 CLINICAL DATA:  Fall.  Pain with inspiration on the right-side. EXAM: CHEST - 2 VIEW COMPARISON:  July 14, 2014 FINDINGS: The heart size and mediastinal contours are within normal limits. Both lungs are clear. The visualized skeletal structures are unremarkable. The patient is status post prior median sternotomy. IMPRESSION: No active cardiopulmonary disease. Electronically Signed   By: Constance Holster M.D.   On: 12/18/2019 15:47   DG Shoulder Right  Result Date: 12/19/2019 CLINICAL DATA:  Fall with right shoulder pain EXAM: RIGHT SHOULDER - 2+ VIEW COMPARISON:  None. FINDINGS: There is no evidence of fracture or dislocation. There is no evidence of arthropathy or other focal bone abnormality. Soft tissues are unremarkable. IMPRESSION: Negative. Electronically Signed   By: Zerita Boers M.D.   On: 12/19/2019 18:12   DG Wrist 2 Views Right  Result Date: 12/19/2019 CLINICAL DATA:  Wrist pain after a fall. EXAM: RIGHT WRIST - 2 VIEW COMPARISON:  None. FINDINGS: There is no evidence of fracture or dislocation. There is no evidence of arthropathy or other focal bone abnormality. Soft tissues are unremarkable. IMPRESSION: Negative. Electronically Signed   By: Zerita Boers M.D.   On: 12/19/2019 18:13   CT HEAD WO CONTRAST  Result Date: 12/18/2019 CLINICAL DATA:  Fall. Head trauma. Facial bruising. On Plavix. EXAM: CT HEAD WITHOUT CONTRAST CT MAXILLOFACIAL WITHOUT CONTRAST CT CERVICAL SPINE WITHOUT CONTRAST TECHNIQUE: Multidetector CT imaging of the head, cervical spine, and maxillofacial structures were performed using the standard protocol without intravenous contrast. Multiplanar CT image reconstructions of the cervical spine and maxillofacial  structures were also generated. COMPARISON:  None. FINDINGS: CT HEAD FINDINGS Brain: There is no evidence of acute infarct, intracranial hemorrhage, mass, midline shift, or extra-axial fluid collection. Mild lateral and third ventriculomegaly is favored to reflect central predominant cerebral atrophy rather than hydrocephalus. Hypodensities in the cerebral white matter bilaterally are nonspecific but compatible with mild chronic small vessel ischemic disease. Vascular: Calcified atherosclerosis at the skull base. No hyperdense vessel. Skull: No fracture or suspicious osseous lesion. Other: None. CT MAXILLOFACIAL FINDINGS Osseous: No fracture, suspicious osseous lesion, or mandibular dislocation. Orbits: Bilateral cataract extraction. No acute traumatic finding. Sinuses: Paranasal sinuses and mastoid air cells are clear. Soft tissues: Carotid atherosclerosis and right tonsillar calcifications. CT CERVICAL SPINE FINDINGS Alignment: Normal. Skull base and vertebrae: No acute fracture or suspicious osseous lesion. Soft tissues and spinal canal: No prevertebral fluid or swelling. No visible canal hematoma. Disc levels: Mild multilevel cervical disc and facet degeneration without evidence of high-grade spinal stenosis. Moderate bilateral neural foraminal stenosis at C3-4 due to uncovertebral and facet spurring. Upper chest: 3 mm nodule in the posterior right upper lobe. Other: Asymmetric enlargement of the left thyroid lobe; this has been documented on prior studies and biopsied in 2019. IMPRESSION: 1. No evidence of acute intracranial abnormality. 2. Mild chronic small vessel ischemic disease and cerebral atrophy. 3. No acute maxillofacial or cervical spine fracture. Electronically Signed   By: Logan Bores M.D.   On: 12/18/2019 15:44   CT Cervical Spine Wo Contrast  Result Date: 12/18/2019 CLINICAL DATA:  Fall. Head trauma. Facial bruising. On Plavix. EXAM: CT HEAD WITHOUT CONTRAST CT MAXILLOFACIAL WITHOUT CONTRAST  CT CERVICAL SPINE WITHOUT CONTRAST TECHNIQUE: Multidetector CT imaging of the head, cervical spine, and maxillofacial structures were performed using the standard protocol without intravenous contrast. Multiplanar CT image reconstructions of the cervical spine and maxillofacial structures were also generated. COMPARISON:  None. FINDINGS: CT HEAD FINDINGS  Brain: There is no evidence of acute infarct, intracranial hemorrhage, mass, midline shift, or extra-axial fluid collection. Mild lateral and third ventriculomegaly is favored to reflect central predominant cerebral atrophy rather than hydrocephalus. Hypodensities in the cerebral white matter bilaterally are nonspecific but compatible with mild chronic small vessel ischemic disease. Vascular: Calcified atherosclerosis at the skull base. No hyperdense vessel. Skull: No fracture or suspicious osseous lesion. Other: None. CT MAXILLOFACIAL FINDINGS Osseous: No fracture, suspicious osseous lesion, or mandibular dislocation. Orbits: Bilateral cataract extraction. No acute traumatic finding. Sinuses: Paranasal sinuses and mastoid air cells are clear. Soft tissues: Carotid atherosclerosis and right tonsillar calcifications. CT CERVICAL SPINE FINDINGS Alignment: Normal. Skull base and vertebrae: No acute fracture or suspicious osseous lesion. Soft tissues and spinal canal: No prevertebral fluid or swelling. No visible canal hematoma. Disc levels: Mild multilevel cervical disc and facet degeneration without evidence of high-grade spinal stenosis. Moderate bilateral neural foraminal stenosis at C3-4 due to uncovertebral and facet spurring. Upper chest: 3 mm nodule in the posterior right upper lobe. Other: Asymmetric enlargement of the left thyroid lobe; this has been documented on prior studies and biopsied in 2019. IMPRESSION: 1. No evidence of acute intracranial abnormality. 2. Mild chronic small vessel ischemic disease and cerebral atrophy. 3. No acute maxillofacial or  cervical spine fracture. Electronically Signed   By: Logan Bores M.D.   On: 12/18/2019 15:44   CT Maxillofacial Wo Contrast  Result Date: 12/18/2019 CLINICAL DATA:  Fall. Head trauma. Facial bruising. On Plavix. EXAM: CT HEAD WITHOUT CONTRAST CT MAXILLOFACIAL WITHOUT CONTRAST CT CERVICAL SPINE WITHOUT CONTRAST TECHNIQUE: Multidetector CT imaging of the head, cervical spine, and maxillofacial structures were performed using the standard protocol without intravenous contrast. Multiplanar CT image reconstructions of the cervical spine and maxillofacial structures were also generated. COMPARISON:  None. FINDINGS: CT HEAD FINDINGS Brain: There is no evidence of acute infarct, intracranial hemorrhage, mass, midline shift, or extra-axial fluid collection. Mild lateral and third ventriculomegaly is favored to reflect central predominant cerebral atrophy rather than hydrocephalus. Hypodensities in the cerebral white matter bilaterally are nonspecific but compatible with mild chronic small vessel ischemic disease. Vascular: Calcified atherosclerosis at the skull base. No hyperdense vessel. Skull: No fracture or suspicious osseous lesion. Other: None. CT MAXILLOFACIAL FINDINGS Osseous: No fracture, suspicious osseous lesion, or mandibular dislocation. Orbits: Bilateral cataract extraction. No acute traumatic finding. Sinuses: Paranasal sinuses and mastoid air cells are clear. Soft tissues: Carotid atherosclerosis and right tonsillar calcifications. CT CERVICAL SPINE FINDINGS Alignment: Normal. Skull base and vertebrae: No acute fracture or suspicious osseous lesion. Soft tissues and spinal canal: No prevertebral fluid or swelling. No visible canal hematoma. Disc levels: Mild multilevel cervical disc and facet degeneration without evidence of high-grade spinal stenosis. Moderate bilateral neural foraminal stenosis at C3-4 due to uncovertebral and facet spurring. Upper chest: 3 mm nodule in the posterior right upper lobe.  Other: Asymmetric enlargement of the left thyroid lobe; this has been documented on prior studies and biopsied in 2019. IMPRESSION: 1. No evidence of acute intracranial abnormality. 2. Mild chronic small vessel ischemic disease and cerebral atrophy. 3. No acute maxillofacial or cervical spine fracture. Electronically Signed   By: Logan Bores M.D.   On: 12/18/2019 15:44    Scheduled Meds: . amLODipine  5 mg Oral Daily  . anastrozole  1 mg Oral Daily  . atorvastatin  10 mg Oral QPM  . cloNIDine  0.1 mg Oral BID  . clopidogrel  75 mg Oral Daily  . enoxaparin (LOVENOX) injection  40 mg Subcutaneous Q24H  . insulin aspart  0-5 Units Subcutaneous QHS  . insulin aspart  0-9 Units Subcutaneous TID WC  . insulin glargine  40 Units Subcutaneous QHS  . levothyroxine  75 mcg Oral Q0600  . loratadine  10 mg Oral Daily  . losartan  100 mg Oral Daily  . metoprolol tartrate  50 mg Oral BID  . pantoprazole  40 mg Oral QAC breakfast  . sodium chloride flush  3 mL Intravenous Q12H   Continuous Infusions:   LOS: 1 day   Marylu Lund, MD [facility] Pager On Amion  If 7PM-7AM, please contact night-coverage 12/20/2019, 2:57 PM

## 2019-12-21 LAB — COMPREHENSIVE METABOLIC PANEL
ALT: 18 U/L (ref 0–44)
AST: 18 U/L (ref 15–41)
Albumin: 3.7 g/dL (ref 3.5–5.0)
Alkaline Phosphatase: 69 U/L (ref 38–126)
Anion gap: 10 (ref 5–15)
BUN: 10 mg/dL (ref 8–23)
CO2: 23 mmol/L (ref 22–32)
Calcium: 8.9 mg/dL (ref 8.9–10.3)
Chloride: 98 mmol/L (ref 98–111)
Creatinine, Ser: 0.59 mg/dL (ref 0.44–1.00)
GFR calc Af Amer: >60 mL/min (ref >60)
GFR calc non Af Amer: >60 mL/min (ref >60)
Glucose, Bld: 164 mg/dL — ABNORMAL HIGH (ref 70–99)
Potassium: 4 mmol/L (ref 3.5–5.1)
Sodium: 131 mmol/L — ABNORMAL LOW (ref 135–145)
Total Bilirubin: 0.6 mg/dL (ref 0.3–1.2)
Total Protein: 6.9 g/dL (ref 6.5–8.1)

## 2019-12-21 LAB — GLUCOSE, CAPILLARY
Glucose-Capillary: 182 mg/dL — ABNORMAL HIGH (ref 70–99)
Glucose-Capillary: 226 mg/dL — ABNORMAL HIGH (ref 70–99)

## 2019-12-21 MED ORDER — AMLODIPINE BESYLATE 5 MG PO TABS
5.0000 mg | ORAL_TABLET | ORAL | Status: DC
  Filled 2019-12-21: qty 1

## 2019-12-21 MED ORDER — AMLODIPINE BESYLATE 10 MG PO TABS: 10.0000 mg | ORAL_TABLET | Freq: Every day | ORAL | Status: DC

## 2019-12-21 MED ORDER — TRAMADOL HCL 50 MG PO TABS: 50.0000 mg | ORAL_TABLET | Freq: Four times a day (QID) | ORAL | 0 refills | Status: DC | PRN

## 2019-12-21 MED ORDER — AMLODIPINE BESYLATE 10 MG PO TABS: 10.0000 mg | ORAL_TABLET | Freq: Every day | ORAL | 0 refills | Status: DC

## 2019-12-21 NOTE — Plan of Care (Signed)
  Problem: Education: Goal: Knowledge of General Education information will improve Description: Including pain rating scale, medication(s)/side effects and non-pharmacologic comfort measures Outcome: Progressing   Problem: Pain Managment: Goal: General experience of comfort will improve Outcome: Progressing   Problem: Safety: Goal: Ability to remain free from injury will improve Outcome: Progressing   

## 2019-12-21 NOTE — Progress Notes (Signed)
Physical Therapy Treatment Patient Details Name: Susan Barnett MRN: [id number] DOB: [birth year] Today's Date: 12/21/2019    History of Present Illness Susan Barnett is a 74 yo female with PMH: CAD, CABG, hypertension, IDDM/type II DM, hyperlipidemia, OSA noncompliant with CPAP, left breast cancer s/p left lumpectomy, radiation therapy, iron deficiency anemia, presented to [facility] ED via EMS following a fall at home. Imaging neg for acute fx. Pt reports 3 falls in past month.     PT Comments    Continuing work on functional mobility and activity tolerance; Able to perform calf stretches well with a few minimal cues for form; She mentioned R rib and shoulder pain when cued to take deep breaths with stretches, so demonstrated pillow splinting, and encouraged incentive spirometry; Performed entire walk with RW -- Susan Barnett reported feeling unsteady with today's walk -- no overt loss of balance noted; Overall good progress; she indicated she is quite interested in HHPT follow up; encourage Outpt PT after [facility] course    Follow Up Recommendations  Home health PT;Supervision for mobility/OOB(followed by Outpt PT for gait and balance dysfunction)     Equipment Recommendations  Rolling walker with 5" wheels    Recommendations for Other Services       Precautions / Restrictions Precautions Precautions: Fall Precaution Comments: 3 falls in past month    Mobility  Bed Mobility                  Transfers Overall transfer level: Needs assistance Equipment used: Rolling walker (2 wheeled) Transfers: Sit to/from Stand Sit to Stand: Min guard         General transfer comment: for safety and balance, min VCs for safe hand placement when returning to sitting   Ambulation/Gait Ambulation/Gait assistance: Min guard(with and without physical contact) Gait Distance (Feet): 100 Feet Assistive device: Rolling walker (2 wheeled) Gait Pattern/deviations: Step-through  pattern;Decreased stride length;Shuffle Gait velocity: decreased   General Gait Details: Continues to tend towards short step legth, with notably dec heel strike bilaterally   Stairs             Wheelchair Mobility    Modified Rankin (Stroke Patients Only)       Balance     Sitting balance-Leahy Scale: Good     Standing balance support: Bilateral upper extremity supported;No upper extremity supported;During functional activity Standing balance-Leahy Scale: Fair                              Cognition Arousal/Alertness: Awake/alert Behavior During Therapy: WFL for tasks assessed/performed Overall Cognitive Status: Within Functional Limits for tasks assessed                                        Exercises Other Exercises Other Exercises: Bilateral calf stretch in sitting with belt; 15-30 sec holex3 reps each Other Exercises: Incentive spiromentry; notably decr insp volume; encouraged consistent use every hour throughout day    General Comments        Pertinent Vitals/Pain Pain Assessment: Faces Faces Pain Scale: Hurts little more Pain Location: generalized, R shoulder/ribcage with certain movements, and deep breaths Pain Descriptors / Indicators: Discomfort;Sore;Grimacing Pain Intervention(s): Monitored during session;Other (comment)(taugth pillow splinting for deep breaths)    Home Living  Prior Function            PT Goals (current goals can now be found in the care plan section) Acute Rehab PT Goals Patient Stated Goal: regain her independence, no more falls  PT Goal Formulation: With patient Time For Goal Achievement: 01/02/20 Potential to Achieve Goals: Good Progress towards PT goals: Progressing toward goals    Frequency    Min 3X/week      PT Plan Current plan remains appropriate    Co-evaluation              AM-PAC PT "6 Clicks" Mobility   Outcome Measure  Help  needed turning from your back to your side while in a flat bed without using bedrails?: None Help needed moving from lying on your back to sitting on the side of a flat bed without using bedrails?: None Help needed moving to and from a bed to a chair (including a wheelchair)?: A Little Help needed standing up from a chair using your arms (e.g., wheelchair or bedside chair)?: A Little Help needed to walk in hospital room?: A Little Help needed climbing 3-5 steps with a railing? : A Little 6 Click Score: 20    End of Session Equipment Utilized During Treatment: Gait belt Activity Tolerance: Patient tolerated treatment well Patient left: in chair;with call bell/phone within reach;with chair alarm set Nurse Communication: Mobility status PT Visit Diagnosis: Muscle weakness (generalized) (M62.81);History of falling (Z91.81);Repeated falls (R29.6);Difficulty in walking, not elsewhere classified (R26.2);Pain;Unsteadiness on feet (R26.81) Pain - Right/Left: Right Pain - part of body: Shoulder(rib cage right side)     Time: [id number] PT Time Calculation (min) (ACUTE ONLY): 18 min  Charges:  $Gait Training: 8-22 mins                     Susan Barnett, PT  Acute Rehabilitation Services Pager [phone] Office [phone]    Susan Barnett 12/21/2019, 11:26 AM

## 2019-12-21 NOTE — TOC Transition Note (Addendum)
Transition of Care [facility]) - CM/SW Discharge Note   Patient Details  Name: Susan Barnett MRN: [id number] Date of Birth: Dec 17, 1945  [facility]) CM/SW Contact:  Bartholomew Crews, RN Phone Number: [phone] 12/21/2019, 1:49 PM   Clinical Narrative:     Spoke with patient at the bedside. PTA home with spouse. Has a cpap at home from [employer], but states that she needs to talk to someone about a different mask. Referral sent to [employer] for RW, and to advise of her cpap needs. Discussed HH recommendations. Patient in agreement. Reviewed agency choice list fro [phone] from medicare.gov list. Referral accepted by [facility] for PT and OT. Patient to received discharge medications from [place]. Patient states that she has a friend who will be picking her up. Patient to transition home today. No further TOC needs identified.    Final next level of care: [place] Barriers to Discharge: No Barriers Identified   Patient Goals and CMS Choice Patient states their goals for this hospitalization and ongoing recovery are:: return home CMS Medicare.gov Compare Post Acute Care list provided to:: Patient Choice offered to / list presented to : Patient  Discharge Placement                       Discharge Plan and Services In-house Referral: NA Discharge Planning Services: CM Consult Post Acute Care Choice: Home Health, Durable Medical Equipment          DME Arranged: [place] DME Agency: [employer] Date DME Agency Contacted: 12/21/19 Time DME Agency Contacted: [phone] Representative spoke with at DME Agency: Thedore Mins HH Arranged: PT, OT [place] Agency: [facility] Date [place]: 12/21/19 Time [place]: [id number] Representative spoke with at [place]: [place] ([place]) Interventions     Readmission Risk Interventions No flowsheet data found.

## 2019-12-21 NOTE — Progress Notes (Signed)
Patient discharge teaching given, including activity, diet, follow-up appoints, and medications. Patient verbalized understanding of all discharge instructions. IV access was d/c'd. Vitals are stable. Skin is intact except as charted in most recent assessments. Pt to be escorted out by volunteer, to be driven home by family.   DME delivered and copay for [facility] pharmacy pending.

## 2019-12-21 NOTE — Discharge Summary (Signed)
Physician Discharge Summary  Susan Barnett [id number] DOB: 1946-07-26 DOA: 12/18/2019  PCP: Shirline Frees, MD  Admit date: 12/18/2019 Discharge date: 12/21/2019  Admitted From: Home Disposition:  Home  Recommendations for Outpatient Follow-up:  1. Follow up with PCP in 1-2 weeks 2. Recommend repeat BMET in one week  Home Health:PT OT  Equipment/Devices:Rolling walker    Discharge Condition:Improved CODE STATUS:Full Diet recommendation: Diabetic   Brief/Interim Summary: 74 year old married female, independent of her activities, of CAD, CABG for repeat in-stent stenosis of right coronary, hypertension, IDDM/type II DM, hyperlipidemia, OSA noncompliant with CPAP, left breast cancer s/p left lumpectomy, radiation therapy and on Arimidex, iron deficiency anemia, presented to [facility] ED via EMS following a fall at home. Patient evaluated along with her friend at bedside. Patient reported that about noon time today, she was helping her spouse to get the groceries into her house. The electric vacuum cleaner was heading towards the main door and to prevent it from getting out, she seemed to hurry up a little bit, tripped and fell face forward onto hardwood floor. She cut her lower lip, hit her chin and anterior chest wall. No head or neck injury. No bleeding reported. No LOC. No preceding dizziness, lightheadedness, palpitations or chest pain. At baseline she has difficulty getting up without assistance of holding onto something like furniture. She was thereby unable to get up and her spouse was unable to help her because of his disabilities. She called 911. Currently reports anterior chest wall pain more on the right parasternal area and some back pain. She denies fevers or chills, dysuria or urinary frequency. Appetite has been good. She drinks a lot of sweet tea. She reports that this was her third fall within a month's time. The last one was a week ago and the one  prior to that was a month ago, all mechanical falls without syncope. She completed 2 doses of her COVID-19 vaccine several weeks ago.  ED Course:Afebrile, blood pressures marginally high but other vital signs stable. Lab work significant for sodium 123, chloride 86, glucose 137, WBC 15.8. Chest x-ray: No active cardiopulmonary disease. CT head: No evidence of acute intracranial abnormality. Mild chronic small vessel ischemic disease and cerebral atrophy. CT C-spine: No cervical spine fracture. CT maxillofacial: No acute findings.  Discharge Diagnoses:  Principal Problem:   Hyponatremia Active Problems:   Hypothyroidism   Type 2 diabetes mellitus with complications (HCC)   Essential hypertension   GERD   Hyperlipidemia   OSA (obstructive sleep apnea)   1. Hyponatremia:  1. Per history, pt admitted to drinking ample amounts of tea prior to admit 2. Urine osm 340's with serum osm low at 264 3. HCTZ on hold. Much improved wtiht fluid restriction.  4. Sodium levels trending up. Noted to be up to 131 on day of d/c 5. Recommend repeat in one week 2. Mechanical falls, recurrent:  1. Imaging reviewed, no fractures or acute pathology noted 2. PT/OT recs for home PT/OT noted 3. CAD s/p CABG:  1. Without chest pains this AM 2. Continue Plavix, statins and metoprolol as tolerated. 4. Essential hypertension:  1. Currently stable and controlled 2. Held HCTZ and Aldactone as per above. 3. Continue amlodipine, Catapres, Cozaar and metoprolol as tolerated 5. Type II DM/IDDM:  1. A1c of 8.4 noted 2. Continued on home dose of 40 units lantus 6. Hyperlipidemia:  1. Continue statins as pt tolerates 7. OSA:  1. Pt reportedly noted her CPAP machine is ill fitting and  has not used it in more than a year.  2. Recommend outpatient follow-up after d/c 8. Left breast cancer status post lumpectomy, radiation:  1. Pt followed by [facility], Dr. Lindi Adie 2. Continue Arimidex as pt  tolerates  Discharge Instructions   Allergies as of 12/21/2019      Reactions   Asa [aspirin] Anaphylaxis   Compazine [prochlorperazine Edisylate] Swelling, Other (See Comments)   Tongue swelling    Nsaids Hives, Swelling   Shellfish Allergy Hives, Swelling   Sulfa Antibiotics Hives, Swelling   Zantac [ranitidine Hcl] Hives, Swelling   Lisinopril Cough      Medication List    STOP taking these medications   hydrochlorothiazide 25 MG tablet Commonly known as: HYDRODIURIL   spironolactone 25 MG tablet Commonly known as: ALDACTONE     TAKE these medications   amLODipine 10 MG tablet Commonly known as: NORVASC Take 1 tablet (10 mg total) by mouth daily. Start taking on: Dec 22, 2019 What changed:   medication strength  how much to take   anastrozole 1 MG tablet Commonly known as: ARIMIDEX Take 1 tablet (1 mg total) by mouth daily.   atorvastatin 10 MG tablet Commonly known as: LIPITOR Take 10 mg by mouth every evening.   CALCIUM PO Take 1 tablet by mouth in the morning and at bedtime.   cetirizine 10 MG tablet Commonly known as: ZYRTEC Take 10 mg by mouth daily.   cloNIDine 0.1 MG tablet Commonly known as: CATAPRES Take 1 tablet by mouth twice daily   clopidogrel 75 MG tablet Commonly known as: PLAVIX Take 75 mg by mouth daily.   Coenzyme Q10 300 MG Caps Take 300 mg by mouth 2 (two) times daily.   diphenhydramine-acetaminophen 25-500 MG Tabs tablet Commonly known as: TYLENOL PM Take 1 tablet by mouth at bedtime as needed (For pain).   Lantus SoloStar 100 UNIT/ML Solostar Pen Generic drug: insulin glargine Inject 40 Units into the skin at bedtime. What changed: how much to take   levothyroxine 75 MCG tablet Commonly known as: SYNTHROID Take 75 mcg by mouth daily before breakfast.   losartan 100 MG tablet Commonly known as: COZAAR Take 1 tablet (100 mg total) by mouth daily.   metoprolol tartrate 50 MG tablet Commonly known as:  LOPRESSOR Take 1 tablet (50 mg total) by mouth 2 (two) times daily.   nitroGLYCERIN 0.4 MG SL tablet Commonly known as: NITROSTAT Place 1 tablet (0.4 mg total) under the tongue every 5 (five) minutes as needed for chest pain.   pantoprazole 40 MG tablet Commonly known as: PROTONIX Take 1 tablet (40 mg total) by mouth daily before breakfast.   potassium chloride 10 MEQ tablet Commonly known as: KLOR-CON Take 1 tablet (10 mEq total) by mouth daily.   sitaGLIPtin-metformin 50-1000 MG tablet Commonly known as: JANUMET Take 1 tablet by mouth 2 (two) times daily with a meal.   SYSTANE OP Place 1 drop into both eyes in the morning and at bedtime.       Allergies  Allergen Reactions  . Asa [Aspirin] Anaphylaxis  . Compazine [Prochlorperazine Edisylate] Swelling and Other (See Comments)    Tongue swelling   . Nsaids Hives and Swelling  . Shellfish Allergy Hives and Swelling  . Sulfa Antibiotics Hives and Swelling  . Zantac [Ranitidine Hcl] Hives and Swelling  . Lisinopril Cough    Procedures/Studies: DG Chest 2 View  Result Date: 12/18/2019 CLINICAL DATA:  Fall.  Pain with inspiration on the right-side. EXAM:  CHEST - 2 VIEW COMPARISON:  July 14, 2014 FINDINGS: The heart size and mediastinal contours are within normal limits. Both lungs are clear. The visualized skeletal structures are unremarkable. The patient is status post prior median sternotomy. IMPRESSION: No active cardiopulmonary disease. Electronically Signed   By: Constance Holster M.D.   On: 12/18/2019 15:47   DG Shoulder Right  Result Date: 12/19/2019 CLINICAL DATA:  Fall with right shoulder pain EXAM: RIGHT SHOULDER - 2+ VIEW COMPARISON:  None. FINDINGS: There is no evidence of fracture or dislocation. There is no evidence of arthropathy or other focal bone abnormality. Soft tissues are unremarkable. IMPRESSION: Negative. Electronically Signed   By: Zerita Boers M.D.   On: 12/19/2019 18:12   DG Wrist 2 Views  Right  Result Date: 12/19/2019 CLINICAL DATA:  Wrist pain after a fall. EXAM: RIGHT WRIST - 2 VIEW COMPARISON:  None. FINDINGS: There is no evidence of fracture or dislocation. There is no evidence of arthropathy or other focal bone abnormality. Soft tissues are unremarkable. IMPRESSION: Negative. Electronically Signed   By: Zerita Boers M.D.   On: 12/19/2019 18:13   CT HEAD WO CONTRAST  Result Date: 12/18/2019 CLINICAL DATA:  Fall. Head trauma. Facial bruising. On Plavix. EXAM: CT HEAD WITHOUT CONTRAST CT MAXILLOFACIAL WITHOUT CONTRAST CT CERVICAL SPINE WITHOUT CONTRAST TECHNIQUE: Multidetector CT imaging of the head, cervical spine, and maxillofacial structures were performed using the standard protocol without intravenous contrast. Multiplanar CT image reconstructions of the cervical spine and maxillofacial structures were also generated. COMPARISON:  None. FINDINGS: CT HEAD FINDINGS Brain: There is no evidence of acute infarct, intracranial hemorrhage, mass, midline shift, or extra-axial fluid collection. Mild lateral and third ventriculomegaly is favored to reflect central predominant cerebral atrophy rather than hydrocephalus. Hypodensities in the cerebral white matter bilaterally are nonspecific but compatible with mild chronic small vessel ischemic disease. Vascular: Calcified atherosclerosis at the skull base. No hyperdense vessel. Skull: No fracture or suspicious osseous lesion. Other: None. CT MAXILLOFACIAL FINDINGS Osseous: No fracture, suspicious osseous lesion, or mandibular dislocation. Orbits: Bilateral cataract extraction. No acute traumatic finding. Sinuses: Paranasal sinuses and mastoid air cells are clear. Soft tissues: Carotid atherosclerosis and right tonsillar calcifications. CT CERVICAL SPINE FINDINGS Alignment: Normal. Skull base and vertebrae: No acute fracture or suspicious osseous lesion. Soft tissues and spinal canal: No prevertebral fluid or swelling. No visible canal hematoma.  Disc levels: Mild multilevel cervical disc and facet degeneration without evidence of high-grade spinal stenosis. Moderate bilateral neural foraminal stenosis at C3-4 due to uncovertebral and facet spurring. Upper chest: 3 mm nodule in the posterior right upper lobe. Other: Asymmetric enlargement of the left thyroid lobe; this has been documented on prior studies and biopsied in 2019. IMPRESSION: 1. No evidence of acute intracranial abnormality. 2. Mild chronic small vessel ischemic disease and cerebral atrophy. 3. No acute maxillofacial or cervical spine fracture. Electronically Signed   By: Logan Bores M.D.   On: 12/18/2019 15:44   CT Cervical Spine Wo Contrast  Result Date: 12/18/2019 CLINICAL DATA:  Fall. Head trauma. Facial bruising. On Plavix. EXAM: CT HEAD WITHOUT CONTRAST CT MAXILLOFACIAL WITHOUT CONTRAST CT CERVICAL SPINE WITHOUT CONTRAST TECHNIQUE: Multidetector CT imaging of the head, cervical spine, and maxillofacial structures were performed using the standard protocol without intravenous contrast. Multiplanar CT image reconstructions of the cervical spine and maxillofacial structures were also generated. COMPARISON:  None. FINDINGS: CT HEAD FINDINGS Brain: There is no evidence of acute infarct, intracranial hemorrhage, mass, midline shift, or extra-axial fluid collection. Mild  lateral and third ventriculomegaly is favored to reflect central predominant cerebral atrophy rather than hydrocephalus. Hypodensities in the cerebral white matter bilaterally are nonspecific but compatible with mild chronic small vessel ischemic disease. Vascular: Calcified atherosclerosis at the skull base. No hyperdense vessel. Skull: No fracture or suspicious osseous lesion. Other: None. CT MAXILLOFACIAL FINDINGS Osseous: No fracture, suspicious osseous lesion, or mandibular dislocation. Orbits: Bilateral cataract extraction. No acute traumatic finding. Sinuses: Paranasal sinuses and mastoid air cells are clear. Soft  tissues: Carotid atherosclerosis and right tonsillar calcifications. CT CERVICAL SPINE FINDINGS Alignment: Normal. Skull base and vertebrae: No acute fracture or suspicious osseous lesion. Soft tissues and spinal canal: No prevertebral fluid or swelling. No visible canal hematoma. Disc levels: Mild multilevel cervical disc and facet degeneration without evidence of high-grade spinal stenosis. Moderate bilateral neural foraminal stenosis at C3-4 due to uncovertebral and facet spurring. Upper chest: 3 mm nodule in the posterior right upper lobe. Other: Asymmetric enlargement of the left thyroid lobe; this has been documented on prior studies and biopsied in 2019. IMPRESSION: 1. No evidence of acute intracranial abnormality. 2. Mild chronic small vessel ischemic disease and cerebral atrophy. 3. No acute maxillofacial or cervical spine fracture. Electronically Signed   By: Logan Bores M.D.   On: 12/18/2019 15:44   CT Maxillofacial Wo Contrast  Result Date: 12/18/2019 CLINICAL DATA:  Fall. Head trauma. Facial bruising. On Plavix. EXAM: CT HEAD WITHOUT CONTRAST CT MAXILLOFACIAL WITHOUT CONTRAST CT CERVICAL SPINE WITHOUT CONTRAST TECHNIQUE: Multidetector CT imaging of the head, cervical spine, and maxillofacial structures were performed using the standard protocol without intravenous contrast. Multiplanar CT image reconstructions of the cervical spine and maxillofacial structures were also generated. COMPARISON:  None. FINDINGS: CT HEAD FINDINGS Brain: There is no evidence of acute infarct, intracranial hemorrhage, mass, midline shift, or extra-axial fluid collection. Mild lateral and third ventriculomegaly is favored to reflect central predominant cerebral atrophy rather than hydrocephalus. Hypodensities in the cerebral white matter bilaterally are nonspecific but compatible with mild chronic small vessel ischemic disease. Vascular: Calcified atherosclerosis at the skull base. No hyperdense vessel. Skull: No fracture  or suspicious osseous lesion. Other: None. CT MAXILLOFACIAL FINDINGS Osseous: No fracture, suspicious osseous lesion, or mandibular dislocation. Orbits: Bilateral cataract extraction. No acute traumatic finding. Sinuses: Paranasal sinuses and mastoid air cells are clear. Soft tissues: Carotid atherosclerosis and right tonsillar calcifications. CT CERVICAL SPINE FINDINGS Alignment: Normal. Skull base and vertebrae: No acute fracture or suspicious osseous lesion. Soft tissues and spinal canal: No prevertebral fluid or swelling. No visible canal hematoma. Disc levels: Mild multilevel cervical disc and facet degeneration without evidence of high-grade spinal stenosis. Moderate bilateral neural foraminal stenosis at C3-4 due to uncovertebral and facet spurring. Upper chest: 3 mm nodule in the posterior right upper lobe. Other: Asymmetric enlargement of the left thyroid lobe; this has been documented on prior studies and biopsied in 2019. IMPRESSION: 1. No evidence of acute intracranial abnormality. 2. Mild chronic small vessel ischemic disease and cerebral atrophy. 3. No acute maxillofacial or cervical spine fracture. Electronically Signed   By: Logan Bores M.D.   On: 12/18/2019 15:44     Subjective: Eager to go home today  Discharge Exam: Vitals:   12/21/19 0535 12/21/19 0914  BP: (!) 170/69 (!) 155/72  Pulse: 65 70  Resp: 16 18  Temp: 98.8 F (37.1 C) 98.2 F (36.8 C)  SpO2: 100% 98%   Vitals:   12/20/19 1704 12/20/19 2239 12/21/19 0535 12/21/19 0914  BP:  134/81 (!) 170/69 Marland Kitchen)  155/72  Pulse:  77 65 70  Resp:   16 18  Temp: 98.7 F (37.1 C) 98.5 F (36.9 C) 98.8 F (37.1 C) 98.2 F (36.8 C)  TempSrc: Oral Oral Oral Oral  SpO2:   100% 98%  Weight:      Height:        General: Pt is alert, awake, not in acute distress Cardiovascular: RRR, S1/S2 +, no rubs, no gallops Respiratory: CTA bilaterally, no wheezing, no rhonchi Abdominal: Soft, NT, ND, bowel sounds + Extremities: no edema,  no cyanosis   The results of significant diagnostics from this hospitalization (including imaging, microbiology, ancillary and laboratory) are listed below for reference.     Microbiology: Recent Results (from the past 240 hour(s))  Respiratory Panel by RT PCR (Flu A&B, Covid) - Nasopharyngeal Swab     Status: None   Collection Time: 12/18/19  5:18 PM   Specimen: Nasopharyngeal Swab  Result Value Ref Range Status   SARS Coronavirus 2 by RT PCR NEGATIVE NEGATIVE Final    Comment: (NOTE) SARS-CoV-2 target nucleic acids are NOT DETECTED. The SARS-CoV-2 RNA is generally detectable in upper respiratoy specimens during the acute phase of infection. The lowest concentration of SARS-CoV-2 viral copies this assay can detect is 131 copies/mL. A negative result does not preclude SARS-Cov-2 infection and should not be used as the sole basis for treatment or other patient management decisions. A negative result may occur with  improper specimen collection/handling, submission of specimen other than nasopharyngeal swab, presence of viral mutation(s) within the areas targeted by this assay, and inadequate number of viral copies (<131 copies/mL). A negative result must be combined with clinical observations, patient history, and epidemiological information. The expected result is Negative. Fact Sheet for Patients:  PinkCheek.be Fact Sheet for Healthcare Providers:  GravelBags.it This test is not yet ap proved or cleared by the [place] FDA and  has been authorized for detection and/or diagnosis of SARS-CoV-2 by FDA under an Emergency Use Authorization (EUA). This EUA will remain  in effect (meaning this test can be used) for the duration of the COVID-19 declaration under Section 564(b)(1) of the Act, 21 U.S.C. section 360bbb-3(b)(1), unless the authorization is terminated or revoked sooner.    Influenza A by PCR NEGATIVE NEGATIVE  Final   Influenza B by PCR NEGATIVE NEGATIVE Final    Comment: (NOTE) The Xpert Xpress SARS-CoV-2/FLU/RSV assay is intended as an aid in  the diagnosis of influenza from Nasopharyngeal swab specimens and  should not be used as a sole basis for treatment. Nasal washings and  aspirates are unacceptable for Xpert Xpress SARS-CoV-2/FLU/RSV  testing. Fact Sheet for Patients: PinkCheek.be Fact Sheet for Healthcare Providers: GravelBags.it This test is not yet approved or cleared by the [place] FDA and  has been authorized for detection and/or diagnosis of SARS-CoV-2 by  FDA under an Emergency Use Authorization (EUA). This EUA will remain  in effect (meaning this test can be used) for the duration of the  Covid-19 declaration under Section 564(b)(1) of the Act, 21  U.S.C. section 360bbb-3(b)(1), unless the authorization is  terminated or revoked. Performed at [facility], [place] [address] [place], [place] [address]      Labs: BNP (last 3 results) No results for input(s): BNP in the last 8760 hours. Basic Metabolic Panel: Recent Labs  Lab 12/18/19 1458 12/18/19 1458 12/18/19 2306 12/18/19 2306 12/19/19 0500 12/19/19 1015 12/19/19 1549 12/20/19 0321 12/21/19 0345  NA 123*   < >  119*   < > 121* 121* 124* 126* 131*  K 4.5  --  3.7  --  3.6  --   --  3.5 4.0  CL 86*  --  85*  --  88*  --   --  90* 98  CO2 25  --  21*  --  20*  --   --  23 23  GLUCOSE 137*  --  256*  --  196*  --   --  126* 164*  BUN 15  --  15  --  13  --   --  13 10  CREATININE 0.73  --  0.63  --  0.57  --   --  0.51 0.59  CALCIUM 9.7  --  8.7*  --  8.6*  --   --  9.1 8.9   < > = values in this interval not displayed.   Liver Function Tests: Recent Labs  Lab 12/18/19 1458 12/20/19 0321 12/21/19 0345  AST 23 18 18   ALT 22 18 18   ALKPHOS 63 59 69  BILITOT 0.6 0.6 0.6  PROT 7.5 7.0 6.9  ALBUMIN 4.2 3.6 3.7   No results for input(s):  LIPASE, AMYLASE in the last 168 hours. No results for input(s): AMMONIA in the last 168 hours. CBC: Recent Labs  Lab 12/18/19 1458 12/19/19 0500 12/20/19 0321  WBC 15.8* 15.3* 11.9*  HGB 13.0 11.8* 12.6  HCT 38.9 34.6* 36.3  MCV 88.4 86.9 86.0  PLT 362 324 328   Cardiac Enzymes: No results for input(s): CKTOTAL, CKMB, CKMBINDEX, TROPONINI in the last 168 hours. BNP: Invalid input(s): POCBNP CBG: Recent Labs  Lab 12/20/19 1153 12/20/19 1700 12/20/19 2236 12/21/19 0713 12/21/19 1117  GLUCAP 201* 204* 165* 182* 226*   D-Dimer No results for input(s): DDIMER in the last 72 hours. Hgb A1c Recent Labs    12/18/19 1902  HGBA1C 8.4*   Lipid Profile No results for input(s): CHOL, HDL, LDLCALC, TRIG, CHOLHDL, LDLDIRECT in the last 72 hours. Thyroid function studies Recent Labs    12/18/19 1902  TSH 2.233   Anemia work up No results for input(s): VITAMINB12, FOLATE, FERRITIN, TIBC, IRON, RETICCTPCT in the last 72 hours. Urinalysis    Component Value Date/Time   COLORURINE STRAW (A) 12/19/2019 1430   APPEARANCEUR CLEAR 12/19/2019 1430   LABSPEC 1.011 12/19/2019 1430   PHURINE 7.0 12/19/2019 1430   GLUCOSEU >=500 (A) 12/19/2019 1430   HGBUR NEGATIVE 12/19/2019 1430   BILIRUBINUR NEGATIVE 12/19/2019 1430   BILIRUBINUR negative 07/13/2019 1343   KETONESUR NEGATIVE 12/19/2019 1430   PROTEINUR NEGATIVE 12/19/2019 1430   UROBILINOGEN 0.2 07/13/2019 1343   UROBILINOGEN 0.2 06/11/2014 1536   NITRITE NEGATIVE 12/19/2019 1430   LEUKOCYTESUR NEGATIVE 12/19/2019 1430   Sepsis Labs Invalid input(s): PROCALCITONIN,  WBC,  LACTICIDVEN Microbiology Recent Results (from the past 240 hour(s))  Respiratory Panel by RT PCR (Flu A&B, Covid) - Nasopharyngeal Swab     Status: None   Collection Time: 12/18/19  5:18 PM   Specimen: Nasopharyngeal Swab  Result Value Ref Range Status   SARS Coronavirus 2 by RT PCR NEGATIVE NEGATIVE Final    Comment: (NOTE) SARS-CoV-2 target nucleic  acids are NOT DETECTED. The SARS-CoV-2 RNA is generally detectable in upper respiratoy specimens during the acute phase of infection. The lowest concentration of SARS-CoV-2 viral copies this assay can detect is 131 copies/mL. A negative result does not preclude SARS-Cov-2 infection and should not be used as the sole basis for  treatment or other patient management decisions. A negative result may occur with  improper specimen collection/handling, submission of specimen other than nasopharyngeal swab, presence of viral mutation(s) within the areas targeted by this assay, and inadequate number of viral copies (<131 copies/mL). A negative result must be combined with clinical observations, patient history, and epidemiological information. The expected result is Negative. Fact Sheet for Patients:  PinkCheek.be Fact Sheet for Healthcare Providers:  GravelBags.it This test is not yet ap proved or cleared by the [place] FDA and  has been authorized for detection and/or diagnosis of SARS-CoV-2 by FDA under an Emergency Use Authorization (EUA). This EUA will remain  in effect (meaning this test can be used) for the duration of the COVID-19 declaration under Section 564(b)(1) of the Act, 21 U.S.C. section 360bbb-3(b)(1), unless the authorization is terminated or revoked sooner.    Influenza A by PCR NEGATIVE NEGATIVE Final   Influenza B by PCR NEGATIVE NEGATIVE Final    Comment: (NOTE) The Xpert Xpress SARS-CoV-2/FLU/RSV assay is intended as an aid in  the diagnosis of influenza from Nasopharyngeal swab specimens and  should not be used as a sole basis for treatment. Nasal washings and  aspirates are unacceptable for Xpert Xpress SARS-CoV-2/FLU/RSV  testing. Fact Sheet for Patients: PinkCheek.be Fact Sheet for Healthcare Providers: GravelBags.it This test is not yet  approved or cleared by the [place] FDA and  has been authorized for detection and/or diagnosis of SARS-CoV-2 by  FDA under an Emergency Use Authorization (EUA). This EUA will remain  in effect (meaning this test can be used) for the duration of the  Covid-19 declaration under Section 564(b)(1) of the Act, 21  U.S.C. section 360bbb-3(b)(1), unless the authorization is  terminated or revoked. Performed at [facility], [place] [address] [place], [place] [address]    Time spent: 30 min  SIGNED:   Marylu Lund, MD  [facility] 12/21/2019, 12:14 PM  If 7PM-7AM, please contact night-coverage

## 2019-12-27 DIAGNOSIS — I25119 Atherosclerotic heart disease of native coronary artery with unspecified angina pectoris: Secondary | ICD-10-CM | POA: Diagnosis not present

## 2019-12-27 DIAGNOSIS — E119 Type 2 diabetes mellitus without complications: Secondary | ICD-10-CM | POA: Diagnosis not present

## 2019-12-27 DIAGNOSIS — E871 Hypo-osmolality and hyponatremia: Secondary | ICD-10-CM | POA: Diagnosis not present

## 2019-12-27 DIAGNOSIS — G8929 Other chronic pain: Secondary | ICD-10-CM | POA: Diagnosis not present

## 2019-12-27 DIAGNOSIS — E785 Hyperlipidemia, unspecified: Secondary | ICD-10-CM | POA: Diagnosis not present

## 2019-12-27 DIAGNOSIS — N390 Urinary tract infection, site not specified: Secondary | ICD-10-CM | POA: Diagnosis not present

## 2019-12-27 DIAGNOSIS — R251 Tremor, unspecified: Secondary | ICD-10-CM | POA: Diagnosis not present

## 2019-12-27 DIAGNOSIS — I499 Cardiac arrhythmia, unspecified: Secondary | ICD-10-CM | POA: Diagnosis not present

## 2019-12-27 DIAGNOSIS — D509 Iron deficiency anemia, unspecified: Secondary | ICD-10-CM | POA: Diagnosis not present

## 2019-12-27 DIAGNOSIS — K219 Gastro-esophageal reflux disease without esophagitis: Secondary | ICD-10-CM | POA: Diagnosis not present

## 2019-12-27 DIAGNOSIS — D131 Benign neoplasm of stomach: Secondary | ICD-10-CM | POA: Diagnosis not present

## 2019-12-27 DIAGNOSIS — E039 Hypothyroidism, unspecified: Secondary | ICD-10-CM | POA: Diagnosis not present

## 2019-12-27 DIAGNOSIS — I10 Essential (primary) hypertension: Secondary | ICD-10-CM | POA: Diagnosis not present

## 2019-12-27 DIAGNOSIS — R296 Repeated falls: Secondary | ICD-10-CM | POA: Diagnosis not present

## 2019-12-27 DIAGNOSIS — M25511 Pain in right shoulder: Secondary | ICD-10-CM | POA: Diagnosis not present

## 2019-12-27 DIAGNOSIS — D126 Benign neoplasm of colon, unspecified: Secondary | ICD-10-CM | POA: Diagnosis not present

## 2019-12-28 DIAGNOSIS — S20219D Contusion of unspecified front wall of thorax, subsequent encounter: Secondary | ICD-10-CM | POA: Diagnosis not present

## 2019-12-28 DIAGNOSIS — E871 Hypo-osmolality and hyponatremia: Secondary | ICD-10-CM | POA: Diagnosis not present

## 2020-01-06 ENCOUNTER — Other Ambulatory Visit: Payer: Self-pay | Admitting: Interventional Cardiology

## 2020-01-12 DIAGNOSIS — E038 Other specified hypothyroidism: Secondary | ICD-10-CM | POA: Diagnosis not present

## 2020-01-12 DIAGNOSIS — E871 Hypo-osmolality and hyponatremia: Secondary | ICD-10-CM | POA: Diagnosis not present

## 2020-01-12 DIAGNOSIS — H8110 Benign paroxysmal vertigo, unspecified ear: Secondary | ICD-10-CM | POA: Diagnosis not present

## 2020-01-12 DIAGNOSIS — E1129 Type 2 diabetes mellitus with other diabetic kidney complication: Secondary | ICD-10-CM | POA: Diagnosis not present

## 2020-01-26 DIAGNOSIS — I499 Cardiac arrhythmia, unspecified: Secondary | ICD-10-CM | POA: Diagnosis not present

## 2020-01-26 DIAGNOSIS — E785 Hyperlipidemia, unspecified: Secondary | ICD-10-CM | POA: Diagnosis not present

## 2020-01-26 DIAGNOSIS — G8929 Other chronic pain: Secondary | ICD-10-CM | POA: Diagnosis not present

## 2020-01-26 DIAGNOSIS — R296 Repeated falls: Secondary | ICD-10-CM | POA: Diagnosis not present

## 2020-01-26 DIAGNOSIS — N390 Urinary tract infection, site not specified: Secondary | ICD-10-CM | POA: Diagnosis not present

## 2020-01-26 DIAGNOSIS — D126 Benign neoplasm of colon, unspecified: Secondary | ICD-10-CM | POA: Diagnosis not present

## 2020-01-26 DIAGNOSIS — E119 Type 2 diabetes mellitus without complications: Secondary | ICD-10-CM | POA: Diagnosis not present

## 2020-01-26 DIAGNOSIS — K219 Gastro-esophageal reflux disease without esophagitis: Secondary | ICD-10-CM | POA: Diagnosis not present

## 2020-01-26 DIAGNOSIS — R251 Tremor, unspecified: Secondary | ICD-10-CM | POA: Diagnosis not present

## 2020-01-26 DIAGNOSIS — D509 Iron deficiency anemia, unspecified: Secondary | ICD-10-CM | POA: Diagnosis not present

## 2020-01-26 DIAGNOSIS — I10 Essential (primary) hypertension: Secondary | ICD-10-CM | POA: Diagnosis not present

## 2020-01-26 DIAGNOSIS — D131 Benign neoplasm of stomach: Secondary | ICD-10-CM | POA: Diagnosis not present

## 2020-01-26 DIAGNOSIS — E871 Hypo-osmolality and hyponatremia: Secondary | ICD-10-CM | POA: Diagnosis not present

## 2020-01-26 DIAGNOSIS — E039 Hypothyroidism, unspecified: Secondary | ICD-10-CM | POA: Diagnosis not present

## 2020-01-26 DIAGNOSIS — M25511 Pain in right shoulder: Secondary | ICD-10-CM | POA: Diagnosis not present

## 2020-01-26 DIAGNOSIS — I25119 Atherosclerotic heart disease of native coronary artery with unspecified angina pectoris: Secondary | ICD-10-CM | POA: Diagnosis not present

## 2020-01-29 DIAGNOSIS — E871 Hypo-osmolality and hyponatremia: Secondary | ICD-10-CM | POA: Diagnosis not present

## 2020-02-07 NOTE — Progress Notes (Signed)
Virtual Visit via Telephone Note   This visit type was conducted due to national recommendations for restrictions regarding the COVID-19 Pandemic (e.g. social distancing) in an effort to limit this patient's exposure and mitigate transmission in our community.  Due to her co-morbid illnesses, this patient is at least at moderate risk for complications without adequate follow up.  This format is felt to be most appropriate for this patient at this time.  The patient did not have access to video technology/had technical difficulties with video requiring transitioning to audio format only (telephone).  All issues noted in this document were discussed and addressed.  No physical exam could be performed with this format.  Please refer to the patient's chart for her  consent to telehealth for [facility].  Evaluation Performed:  Follow-up visit  This visit type was conducted due to national recommendations for restrictions regarding the COVID-19 Pandemic (e.g. social distancing).  This format is felt to be most appropriate for this patient at this time.  All issues noted in this document were discussed and addressed.  No physical exam was performed (except for noted visual exam findings with Video Visits).  Please refer to the patient's chart (MyChart message for video visits and phone note for telephone visits) for the patient's consent to telehealth for [facility].  Date:  02/08/2020   ID:  SAMREEN SELTZER, DOB 1946/03/11, MRN [id number]  Patient Location:  Home  Provider location:   [facility]  Cardiology Office Note:    Date:  02/08/2020   ID:  MIKENA MASONER, DOB [birth year], MRN [id number]  PCP:  Shirline Frees, MD  Cardiologist:  Sinclair Grooms, MD    Referring MD: Shirline Frees, MD   CC:   OSA   History of Present Illness:    TAKELIA URIETA is a 74 y.o. female with a hx of OSA on CPAP and HTN.  She was doing well with her CPAP device when I saw her last but  apparently stopped using it. I saw her back in 2019 and was doing well with her PAP therapy but last May started getting styes in her eye and thought it was related to her PAP mask blowing air into her eyes.  She did not seek help due to COVID 19.  She was using the nasal mask at time.  She thinks it was related to the new mask she got right after seeing me.  She tells me that she feels tired during the day and when she sits down to read during the day she will fall asleep.  She wakes up at least 3 times nightly but not always to use the restroom.  She has a lot of pain in her neck and shoulders that keeps her up as well.  She goes to bed at 11:30PM and gets up at 5am.  Her husband says that she snores at night.    Past Medical History:  Diagnosis Date  . Anemia    mild  . Anginal pain ([place])    with exertion, last time prior to cardiac caht 06/02/14  . Anxiety    situational  . Arthritis   . Breast cancer ([place]) 2018   Left Breast Cancer  . CAD (coronary artery disease)    Dr. Linard Millers follows, no problems now  . Cancer [facility])    Breast cancer  . Complication of anesthesia   . Diabetes mellitus, type 2 ([place])    TYPE 2  .  Dysrhythmia    occ skips a beat, rate was fast before beta blocker.  . Gastric polyp   . Genetic testing 04/12/2017   Ms. Boccio underwent genetic counseling and testing for hereditary cancer syndromes on 02/19/2017. Her results were negative for mutations in all 46 genes analyzed by [facility]'s 46-gene Common Hereditary Cancers Panel. Genes analyzed include: APC, ATM, AXIN2, BARD1, BMPR1A, BRCA1, BRCA2, BRIP1, CDH1, CDKN2A, CHEK2, CTNNA1, DICER1, EPCAM, GREM1, HOXB13, KIT, MEN1, MLH1, MSH2, MSH3, MSH6, MUTYH, NBN  . GERD (gastroesophageal reflux disease)   . Headache    hx of  . Heartburn   . Hiatal hernia    mild head tremors  . History of radiation therapy 01/30/17- 02/21/17   Left Breast 42.56 Gy in 16 fractions  . Hypertension   . Hypothyroidism   . Occasional  tremors    "of head", slight hands  . Osteopenia   . Personal history of radiation therapy 2018   Left Breast Cancer  . Pneumonia   . PONV (postoperative nausea and vomiting) 02/24/14  . Shortness of breath    with exertion  . Sleep apnea    cpap used nightly x 2 yrs now.    Past Surgical History:  Procedure Laterality Date  . BREAST BIOPSY Left 1987  . BREAST LUMPECTOMY Left 01/25/2017  . BREAST LUMPECTOMY WITH RADIOACTIVE SEED AND SENTINEL LYMPH NODE BIOPSY Left 12/25/2016   Procedure: BREAST LUMPECTOMY WITH RADIOACTIVE SEED AND SENTINEL LYMPH NODE BIOPSY;  Surgeon: [name], MD;  Location: [place];  Service: General;  Laterality: Left;  . CARDIAC CATHETERIZATION  06/02/2014   LAD 30%, D1 80%, CFX 755, RCA > 95% ISR, rx w/ PTCA, heavy calcification, EF 65%  . CORONARY ARTERY BYPASS GRAFT N/A 06/14/2014   Procedure: CORONARY ARTERY BYPASS GRAFTING (CABG);  Surgeon: Gaye Pollack, MD;  Location: [place];  Service: Open Heart Surgery;  Laterality: N/A;  Times 2 using endoscopically harvested right saphenous vein.  . coronary stents     x3 stents placed-prior ablation  . DILATION AND CURETTAGE OF UTERUS    . ELBOW FRACTURE SURGERY Left   . INCONTINENCE SURGERY     sling  . KNEE ARTHROSCOPY Right 02/24/2014   Procedure: RIGHT KNEE ARTHROSCOPY WITH DEBRIDEMENT ;  Surgeon: Gearlean Alf, MD;  [facility];  Service: Orthopedics;  Laterality: Right;  . LEFT HEART CATHETERIZATION WITH CORONARY ANGIOGRAM N/A 06/02/2014   Procedure: LEFT HEART CATHETERIZATION WITH CORONARY ANGIOGRAM;  Surgeon: Sinclair Grooms, MD;  Location: [facility] C[facility] LAB;  Service: Cardiovascular;  Laterality: N/A;  . TEE WITHOUT CARDIOVERSION N/A 06/14/2014   Procedure: TRANSESOPHAGEAL ECHOCARDIOGRAM (TEE);  Surgeon: Gaye Pollack, MD;  Location: [place];  Service: Open Heart Surgery;  Laterality: N/A;    Current Medications: Current Meds  Medication Sig  . amLODipine (NORVASC) 10 MG tablet Take 1 tablet (10  mg total) by mouth daily.  Marland Kitchen anastrozole (ARIMIDEX) 1 MG tablet Take 1 tablet (1 mg total) by mouth daily.  Marland Kitchen atorvastatin (LIPITOR) 10 MG tablet Take 10 mg by mouth every evening.   Marland Kitchen CALCIUM PO Take 1 tablet by mouth in the morning and at bedtime.  . cloNIDine (CATAPRES) 0.1 MG tablet Take 1 tablet by mouth twice daily  . clopidogrel (PLAVIX) 75 MG tablet Take 75 mg by mouth daily.  . Coenzyme Q10 300 MG CAPS Take 300 mg by mouth 2 (two) times daily.  . Cranberry 1000 MG CAPS Take by mouth.  . diphenhydramine-acetaminophen (TYLENOL  PM) 25-500 MG TABS tablet Take 1 tablet by mouth at bedtime as needed (For pain).   Marland Kitchen LANTUS SOLOSTAR 100 UNIT/ML Solostar Pen Inject 40 Units into the skin at bedtime. (Patient taking differently: Inject 44 Units into the skin at bedtime. )  . levothyroxine (SYNTHROID, LEVOTHROID) 75 MCG tablet Take 75 mcg by mouth daily before breakfast.   . Loratadine (CLARITIN) 10 MG CAPS Take by mouth.  . losartan (COZAAR) 100 MG tablet Take 1 tablet (100 mg total) by mouth daily.  . metoprolol (LOPRESSOR) 50 MG tablet Take 1 tablet (50 mg total) by mouth 2 (two) times daily.  . Multiple Vitamin (MULTIVITAMIN ADULT PO) Take by mouth.  . nitroGLYCERIN (NITROSTAT) 0.4 MG SL tablet Place 1 tablet (0.4 mg total) under the tongue every 5 (five) minutes as needed for chest pain.  . pantoprazole (PROTONIX) 40 MG tablet Take 1 tablet (40 mg total) by mouth daily before breakfast.  . Polyethyl Glycol-Propyl Glycol (SYSTANE OP) Place 1 drop into both eyes as needed.   . potassium chloride (KLOR-CON) 10 MEQ tablet Take 1 tablet (10 mEq total) by mouth daily.  . sitaGLIPtin-metformin (JANUMET) 50-1000 MG tablet Take 1 tablet by mouth 2 (two) times daily with a meal.  . traMADol (ULTRAM) 50 MG tablet Take 1 tablet (50 mg total) by mouth every 6 (six) hours as needed.     Allergies:   Asa [aspirin], Compazine [prochlorperazine edisylate], Nsaids, Shellfish allergy, Sulfa antibiotics,  Zantac [ranitidine hcl], and Lisinopril   Social History   Socioeconomic History  . Marital status: Married    Spouse name: Not on file  . Number of children: Not on file  . Years of education: Not on file  . Highest education level: Not on file  Occupational History  . Not on file  Tobacco Use  . Smoking status: Never Smoker  . Smokeless tobacco: Never Used  Vaping Use  . Vaping Use: Never used  Substance and Sexual Activity  . Alcohol use: No  . Drug use: No  . Sexual activity: Not Currently  Other Topics Concern  . Not on file  Social History Narrative  . Not on file   Social Determinants of Health   Financial Resource Strain:   . Difficulty of Paying Living Expenses:   Food Insecurity:   . Worried About Charity fundraiser in the Last Year:   . Arboriculturist in the Last Year:   Transportation Needs:   . Film/video editor (Medical):   Marland Kitchen Lack of Transportation (Non-Medical):   Physical Activity:   . Days of Exercise per Week:   . Minutes of Exercise per Session:   Stress:   . Feeling of Stress :   Social Connections:   . Frequency of Communication with Friends and Family:   . Frequency of Social Gatherings with Friends and Family:   . Attends Religious Services:   . Active Member of Clubs or Organizations:   . Attends Archivist Meetings:   Marland Kitchen Marital Status:      Family History: The patient's family history includes Breast cancer (age of onset: 85) in her cousin; Diabetes in her mother; Healthy in her brother; Heart attack in her mother; Hodgkin's lymphoma in her maternal uncle; Hypertension in her mother; Lung cancer in her brother; Ulcerative colitis in her daughter.  ROS:   Please see the history of present illness.    ROS  All other systems reviewed and negative.   EKGs/Labs/Other  Studies Reviewed:    The following studies were reviewed today: CPAP download  EKG:  EKG is not ordered today.    Recent Labs: 12/18/2019: TSH  2.233 12/20/2019: Hemoglobin 12.6; Platelets 328 12/21/2019: ALT 18; BUN 10; Creatinine, Ser 0.59; Potassium 4.0; Sodium 131   Recent Lipid Panel    Component Value Date/Time   CHOL  08/13/2007 0615    116        ATP III CLASSIFICATION:  <200     mg/dL   Desirable  200-239  mg/dL   Borderline High  >=240    mg/dL   High   TRIG 69 08/13/2007 0615   HDL 41 08/13/2007 0615   CHOLHDL 2.8 08/13/2007 0615   VLDL 14 08/13/2007 0615   LDLCALC  08/13/2007 0615    61        Total Cholesterol/HDL:CHD Risk Coronary Heart Disease Risk Table                     Men   Women  1/2 Average Risk   3.4   3.3    Physical Exam:    VS:  BP 111/69   Pulse 73   Temp (!) 97.3 F (36.3 C)   Ht 5' 2"  (1.575 m)   Wt 157 lb (71.2 kg)   BMI 28.72 kg/m     Wt Readings from Last 3 Encounters:  02/08/20 157 lb (71.2 kg)  12/19/19 160 lb 15 oz (73 kg)  10/19/19 165 lb 3.2 oz (74.9 kg)     ASSESSMENT/PLAN:    In order of problems listed above:  1.  OSA -She stopped using her device a year ago because she thought that her mask was causing inflammation of her eyes from air blowing into her eyes.   -she continues to have excessive daytime sleepiness and wakes herself up snoring and I think she would benefit getting back on PAP -I will order her new PAP supplies and a new mask -I have recommended that she go in person to the DME to get a mask fitting  -followup with me in 8 weeks  2.  HTN  -BP controlled -continue amlodipine 70m daily, Clonidine 0.14mBID, Losartan 10021maily, Lopressor 42m22mD  Medication Adjustments/Labs and Tests Ordered: Current medicines are reviewed at length with the patient today.  Concerns regarding medicines are outlined above.  No orders of the defined types were placed in this encounter.  No orders of the defined types were placed in this encounter.   Signed, TracFransico Him  02/08/2020 10:11 AM    [facility]

## 2020-02-08 ENCOUNTER — Telehealth (INDEPENDENT_AMBULATORY_CARE_PROVIDER_SITE_OTHER): Payer: Medicare Other | Admitting: Cardiology

## 2020-02-08 ENCOUNTER — Encounter: Payer: Self-pay | Admitting: Cardiology

## 2020-02-08 VITALS — BP 111/69 | HR 73 | Temp 97.3°F | Ht 62.0 in | Wt 157.0 lb

## 2020-02-08 DIAGNOSIS — I10 Essential (primary) hypertension: Secondary | ICD-10-CM

## 2020-02-08 DIAGNOSIS — G4733 Obstructive sleep apnea (adult) (pediatric): Secondary | ICD-10-CM | POA: Diagnosis not present

## 2020-02-09 ENCOUNTER — Telehealth: Payer: Self-pay | Admitting: *Deleted

## 2020-02-09 DIAGNOSIS — G4733 Obstructive sleep apnea (adult) (pediatric): Secondary | ICD-10-CM

## 2020-02-09 DIAGNOSIS — E785 Hyperlipidemia, unspecified: Secondary | ICD-10-CM | POA: Diagnosis not present

## 2020-02-09 DIAGNOSIS — E039 Hypothyroidism, unspecified: Secondary | ICD-10-CM | POA: Diagnosis not present

## 2020-02-09 DIAGNOSIS — E0842 Diabetes mellitus due to underlying condition with diabetic polyneuropathy: Secondary | ICD-10-CM | POA: Diagnosis not present

## 2020-02-09 DIAGNOSIS — I25119 Atherosclerotic heart disease of native coronary artery with unspecified angina pectoris: Secondary | ICD-10-CM | POA: Diagnosis not present

## 2020-02-09 NOTE — Telephone Encounter (Signed)
Order placed to [employer] [facility] via community message 

## 2020-02-09 NOTE — Telephone Encounter (Signed)
-----   Message from Sueanne Margarita, MD sent at 02/08/2020 10:14 AM EDT ----- She stopped using her device a year ago because she thought that her mask was causing inflammation of her eyes from air blowing into her eyes. Please order new PAP supplies and a new mask.  I have recommended that she go in person to the DME to get a mask fitting with a new mask in person with the DME. -followup with me in 8 weeks

## 2020-02-19 DIAGNOSIS — I25119 Atherosclerotic heart disease of native coronary artery with unspecified angina pectoris: Secondary | ICD-10-CM | POA: Diagnosis not present

## 2020-02-19 DIAGNOSIS — I10 Essential (primary) hypertension: Secondary | ICD-10-CM | POA: Diagnosis not present

## 2020-02-19 DIAGNOSIS — E785 Hyperlipidemia, unspecified: Secondary | ICD-10-CM | POA: Diagnosis not present

## 2020-02-19 DIAGNOSIS — E039 Hypothyroidism, unspecified: Secondary | ICD-10-CM | POA: Diagnosis not present

## 2020-02-23 DIAGNOSIS — Z0000 Encounter for general adult medical examination without abnormal findings: Secondary | ICD-10-CM | POA: Diagnosis not present

## 2020-02-23 DIAGNOSIS — E1165 Type 2 diabetes mellitus with hyperglycemia: Secondary | ICD-10-CM | POA: Diagnosis not present

## 2020-02-23 DIAGNOSIS — I10 Essential (primary) hypertension: Secondary | ICD-10-CM | POA: Diagnosis not present

## 2020-02-23 DIAGNOSIS — E7800 Pure hypercholesterolemia, unspecified: Secondary | ICD-10-CM | POA: Diagnosis not present

## 2020-03-02 DIAGNOSIS — E1129 Type 2 diabetes mellitus with other diabetic kidney complication: Secondary | ICD-10-CM | POA: Diagnosis not present

## 2020-03-02 DIAGNOSIS — E049 Nontoxic goiter, unspecified: Secondary | ICD-10-CM | POA: Diagnosis not present

## 2020-03-02 DIAGNOSIS — I5189 Other ill-defined heart diseases: Secondary | ICD-10-CM | POA: Diagnosis not present

## 2020-03-02 DIAGNOSIS — Z794 Long term (current) use of insulin: Secondary | ICD-10-CM | POA: Diagnosis not present

## 2020-03-10 ENCOUNTER — Other Ambulatory Visit: Payer: Self-pay | Admitting: Hematology and Oncology

## 2020-03-10 DIAGNOSIS — Z853 Personal history of malignant neoplasm of breast: Secondary | ICD-10-CM

## 2020-03-13 ENCOUNTER — Other Ambulatory Visit: Payer: Self-pay

## 2020-03-13 ENCOUNTER — Ambulatory Visit
Admission: EM | Admit: 2020-03-13 | Discharge: 2020-03-13 | Disposition: A | Discharge status: Home / Self Care | Payer: Medicare Other | Attending: Emergency Medicine | Admitting: Emergency Medicine

## 2020-03-13 ENCOUNTER — Encounter: Payer: Self-pay | Admitting: Emergency Medicine

## 2020-03-13 DIAGNOSIS — N3001 Acute cystitis with hematuria: Secondary | ICD-10-CM | POA: Diagnosis not present

## 2020-03-13 LAB — POCT URINALYSIS DIP (MANUAL ENTRY)
Bilirubin, UA: NEGATIVE
Glucose, UA: NEGATIVE mg/dL
Ketones, POC UA: NEGATIVE mg/dL
Nitrite, UA: NEGATIVE
Protein Ur, POC: NEGATIVE mg/dL
Spec Grav, UA: 1.015 (ref 1.010–1.025)
Urobilinogen, UA: 0.2 E.U./dL
pH, UA: 6 (ref 5.0–8.0)

## 2020-03-13 MED ORDER — NITROFURANTOIN MONOHYD MACRO 100 MG PO CAPS
100.0000 mg | ORAL_CAPSULE | Freq: Two times a day (BID) | ORAL | 0 refills | Status: DC
Start: 2020-03-13 — End: 2020-04-24

## 2020-03-13 NOTE — ED Provider Notes (Signed)
[name]    CSN: [id number] Arrival date & time: 03/13/20  1538      History   Chief Complaint Chief Complaint  Patient presents with  . Dysuria    HPI Susan Barnett is a 74 y.o. female presenting for possible UTI.  Patient nursing.  With urination since late last night.  Denies abdominal or pelvic pain, back pain, fever.  No blood in urine.  Has tried Azo with some relief.  UTI was in November 2020: Seen here for this, see those records which were reviewed by me at time of visit.    Past Medical History:  Diagnosis Date  . Anemia    mild  . Anginal pain ([place])    with exertion, last time prior to cardiac caht 06/02/14  . Anxiety    situational  . Arthritis   . Breast cancer ([place]) 2018   Left Breast Cancer  . CAD (coronary artery disease)    Dr. Linard Millers follows, no problems now  . Cancer [facility])    Breast cancer  . Complication of anesthesia   . Diabetes mellitus, type 2 ([place])    TYPE 2  . Dysrhythmia    occ skips a beat, rate was fast before beta blocker.  . Gastric polyp   . Genetic testing 04/12/2017   [name] underwent genetic counseling and testing for hereditary cancer syndromes on 02/19/2017. Her results were negative for mutations in all 46 genes analyzed by [facility]'s 46-gene Common Hereditary Cancers Panel. Genes analyzed include: APC, ATM, AXIN2, BARD1, BMPR1A, BRCA1, BRCA2, BRIP1, CDH1, CDKN2A, CHEK2, CTNNA1, DICER1, EPCAM, GREM1, HOXB13, KIT, MEN1, MLH1, MSH2, MSH3, MSH6, MUTYH, NBN  . GERD (gastroesophageal reflux disease)   . Headache    hx of  . Heartburn   . Hiatal hernia    mild head tremors  . History of radiation therapy 01/30/17- 02/21/17   Left Breast 42.56 Gy in 16 fractions  . Hypertension   . Hypothyroidism   . Occasional tremors    "of head", slight hands  . Osteopenia   . Personal history of radiation therapy 2018   Left Breast Cancer  . Pneumonia   . PONV (postoperative nausea and vomiting) 02/24/14  . Shortness of  breath    with exertion  . Sleep apnea    cpap used nightly x 2 yrs now.    Patient Active Problem List   Diagnosis Date Noted  . Hyponatremia 12/18/2019  . Iron deficiency anemia 08/20/2017  . OSA (obstructive sleep apnea) 05/16/2017  . Genetic testing 04/12/2017  . Malignant neoplasm of upper-outer quadrant of left breast in female, estrogen receptor positive ([place]) 10/15/2016  . Hyperlipidemia 07/14/2015  . Coronary artery disease involving coronary bypass graft of native heart with angina pectoris ([place]) 06/14/2014  . Lateral meniscal tear 02/23/2014  . GASTRIC POLYP 11/19/2007  . Hypothyroidism 11/19/2007  . Type 2 diabetes mellitus with complications ([place]) 63/78/5885  . Essential hypertension 11/19/2007  . GERD 11/19/2007  . HIATAL HERNIA 11/19/2007  . RECTAL BLEEDING 11/19/2007    Past Surgical History:  Procedure Laterality Date  . BREAST BIOPSY Left 1987  . BREAST LUMPECTOMY Left 01/25/2017  . BREAST LUMPECTOMY WITH RADIOACTIVE SEED AND SENTINEL LYMPH NODE BIOPSY Left 12/25/2016   Procedure: BREAST LUMPECTOMY WITH RADIOACTIVE SEED AND SENTINEL LYMPH NODE BIOPSY;  Surgeon: [name], MD;  Location: [place];  Service: General;  Laterality: Left;  . CARDIAC CATHETERIZATION  06/02/2014   LAD 30%, D1 80%, CFX 755,  RCA > 95% ISR, rx w/ PTCA, heavy calcification, EF 65%  . CORONARY ARTERY BYPASS GRAFT N/A 06/14/2014   Procedure: CORONARY ARTERY BYPASS GRAFTING (CABG);  Surgeon: Gaye Pollack, MD;  Location: [place];  Service: Open Heart Surgery;  Laterality: N/A;  Times 2 using endoscopically harvested right saphenous vein.  . coronary stents     x3 stents placed-prior ablation  . DILATION AND CURETTAGE OF UTERUS    . ELBOW FRACTURE SURGERY Left   . INCONTINENCE SURGERY     sling  . KNEE ARTHROSCOPY Right 02/24/2014   Procedure: RIGHT KNEE ARTHROSCOPY WITH DEBRIDEMENT ;  Surgeon: Gearlean Alf, MD;  [facility];  Service: Orthopedics;  Laterality: Right;  . LEFT  HEART CATHETERIZATION WITH CORONARY ANGIOGRAM N/A 06/02/2014   Procedure: LEFT HEART CATHETERIZATION WITH CORONARY ANGIOGRAM;  Surgeon: Sinclair Grooms, MD;  Location: [facility] CATH LAB;  Service: Cardiovascular;  Laterality: N/A;  . TEE WITHOUT CARDIOVERSION N/A 06/14/2014   Procedure: TRANSESOPHAGEAL ECHOCARDIOGRAM (TEE);  Surgeon: Gaye Pollack, MD;  Location: [place];  Service: Open Heart Surgery;  Laterality: N/A;    OB History   No obstetric history on file.      Home Medications    Prior to Admission medications   Medication Sig Start Date End Date Taking? Authorizing Provider  amLODipine (NORVASC) 10 MG tablet Take 1 tablet (10 mg total) by mouth daily. 12/22/19 02/08/20  [name], MD  anastrozole (ARIMIDEX) 1 MG tablet Take 1 tablet (1 mg total) by mouth daily. 05/29/19   [name], MD  atorvastatin (LIPITOR) 10 MG tablet Take 10 mg by mouth every evening.     [provider]  CALCIUM PO Take 1 tablet by mouth in the morning and at bedtime.    [provider]  cetirizine (ZYRTEC) 10 MG tablet Take 10 mg by mouth daily. Patient not taking: Reported on 02/08/2020    [provider]  cloNIDine (CATAPRES) 0.1 MG tablet Take 1 tablet by mouth twice daily 01/06/20   [name], MD  clopidogrel (PLAVIX) 75 MG tablet Take 75 mg by mouth daily.    [provider]  Coenzyme Q10 300 MG CAPS Take 300 mg by mouth 2 (two) times daily.    [provider]  Cranberry 1000 MG CAPS Take by mouth.    [provider]  diphenhydramine-acetaminophen (TYLENOL PM) 25-500 MG TABS tablet Take 1 tablet by mouth at bedtime as needed (For pain).     [provider]  LANTUS SOLOSTAR 100 UNIT/ML Solostar Pen Inject 40 Units into the skin at bedtime. Patient taking differently: Inject 44 Units into the skin at bedtime.  05/23/18   [name], MD  levothyroxine (SYNTHROID, LEVOTHROID) 75 MCG tablet Take 75 mcg by mouth daily before breakfast.      [provider]  Loratadine (CLARITIN) 10 MG CAPS Take by mouth.    [provider]  losartan (COZAAR) 100 MG tablet Take 1 tablet (100 mg total) by mouth daily. 10/28/19   [name], MD  metoprolol (LOPRESSOR) 50 MG tablet Take 1 tablet (50 mg total) by mouth 2 (two) times daily. 09/08/14   [name], MD  Multiple Vitamin (MULTIVITAMIN ADULT PO) Take by mouth.    [provider]  nitrofurantoin, macrocrystal-monohydrate, (MACROBID) 100 MG capsule Take 1 capsule (100 mg total) by mouth 2 (two) times daily. 03/13/20   Hall-Potvin, [place], PA-C  nitroGLYCERIN (NITROSTAT) 0.4 MG SL tablet Place  1 tablet (0.4 mg total) under the tongue every 5 (five) minutes as needed for chest pain. 08/29/18 02/08/20  [name], MD  pantoprazole (PROTONIX) 40 MG tablet Take 1 tablet (40 mg total) by mouth daily before breakfast. 09/08/14   [name], MD  Polyethyl Glycol-Propyl Glycol (SYSTANE OP) Place 1 drop into both eyes as needed.     [provider]  potassium chloride (KLOR-CON) 10 MEQ tablet Take 1 tablet (10 mEq total) by mouth daily. 10/19/19 02/08/20  [name], MD  sitaGLIPtin-metformin (JANUMET) 50-1000 MG tablet Take 1 tablet by mouth 2 (two) times daily with a meal.    [provider]  traMADol (ULTRAM) 50 MG tablet Take 1 tablet (50 mg total) by mouth every 6 (six) hours as needed. 12/21/19   [name], MD    Family History Family History  Problem Relation Age of Onset  . Heart attack Mother   . Hypertension Mother   . Diabetes Mother   . Lung cancer Brother        d.57 from metastatic disease. History of smoking.  Marland Kitchen Healthy Brother   . Breast cancer Cousin 30       d.30s  . Hodgkin's lymphoma Maternal Uncle        d.62s  . Ulcerative colitis Daughter     Social History Social History   Tobacco Use  . Smoking status: Never Smoker  . Smokeless tobacco: Never Used  Vaping Use  . Vaping Use: Never used  Substance  Use Topics  . Alcohol use: No  . Drug use: No     Allergies   Asa [aspirin], Compazine [prochlorperazine edisylate], Nsaids, Shellfish allergy, Sulfa antibiotics, Zantac [ranitidine hcl], and Lisinopril   Review of Systems As per HPI   Physical Exam Triage Vital Signs ED Triage Vitals [03/13/20 1608]  Enc Vitals Group     BP (!) 154/78     Pulse Rate 80     Resp 18     Temp 97.9 F (36.6 C)     Temp Source Oral     SpO2 97 %     Weight      Height      Head Circumference      Peak Flow      Pain Score 0     Pain Loc      Pain Edu?      Excl. in [place]?    No data found.  Updated Vital Signs BP (!) 154/78 (BP Location: Right Arm)   Pulse 80   Temp 97.9 F (36.6 C) (Oral)   Resp 18   SpO2 97%   Visual Acuity Right Eye Distance:   Left Eye Distance:   Bilateral Distance:    Right Eye Near:   Left Eye Near:    Bilateral Near:     Physical Exam Constitutional:      General: She is not in acute distress. HENT:     Head: Normocephalic and atraumatic.  Eyes:     General: No scleral icterus.    Pupils: Pupils are equal, round, and reactive to light.  Cardiovascular:     Rate and Rhythm: Normal rate.  Pulmonary:     Effort: Pulmonary effort is normal.  Abdominal:     General: Bowel sounds are normal.     Palpations: Abdomen is soft.     Tenderness: There is no abdominal tenderness. There is no right CVA tenderness, left CVA tenderness or guarding.  Skin:    Coloration: Skin is not jaundiced or pale.  Neurological:     Mental Status: She is alert and oriented to person, place, and time.      UC Treatments / Results  Labs (all labs ordered are listed, but only abnormal results are displayed) Labs Reviewed  POCT URINALYSIS DIP (MANUAL ENTRY) - Abnormal; Notable for the following components:      Result Value   Blood, UA trace-lysed (*)    Leukocytes, UA Trace (*)    All other components within normal limits  URINE CULTURE     EKG   Radiology No results found.  Procedures Procedures (including critical care time)  Medications Ordered in UC Medications - No data to display  Initial Impression / Assessment and Plan / UC Course  I have reviewed the triage vital signs and the nursing notes.  Pertinent labs & imaging results that were available during my care of the patient were reviewed by me and considered in my medical decision making (see chart for details).     Patient appears well in office today.  Urine significant for leukocytes, blood: Culture pending.  Last UTI from November 30: Culture positive for E. coli that improved with Keflex despite being resistant to ampicillin.  Was sensitive to nitrofurantoin-we will start this today, change antibiotic if indicated.  Return precautions discussed, pt  verbalized understanding and is agreeable to plan. Final Clinical Impressions(s) / UC Diagnoses   Final diagnoses:  Acute cystitis with hematuria     Discharge Instructions     Take antibiotic twice daily with food. Important to drink plenty of water throughout the day. May continue Azo as needed for burning sensation. Return for worsening urinary symptoms, blood in urine, abdominal or back pain, fever.    ED Prescriptions    Medication Sig Dispense Auth. Provider   nitrofurantoin, macrocrystal-monohydrate, (MACROBID) 100 MG capsule Take 1 capsule (100 mg total) by mouth 2 (two) times daily. 10 capsule Hall-Potvin, [place], PA-C     PDMP not reviewed this encounter.   Hall-Potvin, [place], [place] 03/13/20 1640

## 2020-03-13 NOTE — ED Notes (Signed)
Patient able to ambulate independently  

## 2020-03-13 NOTE — ED Triage Notes (Signed)
APP assessment prior to RN triage.  Please see provider note.

## 2020-03-13 NOTE — Discharge Instructions (Signed)
Take antibiotic twice daily with food. Important to drink plenty of water throughout the day. May continue Azo as needed for burning sensation. Return for worsening urinary symptoms, blood in urine, abdominal or back pain, fever. 

## 2020-03-16 LAB — URINE CULTURE: Culture: >=100000 — AB

## 2020-03-30 ENCOUNTER — Other Ambulatory Visit: Payer: Self-pay

## 2020-03-30 ENCOUNTER — Ambulatory Visit
Admission: RE | Admit: 2020-03-30 | Discharge: 2020-03-30 | Disposition: A | Discharge status: Home / Self Care | Payer: Medicare Other | Source: Ambulatory Visit | Attending: Hematology and Oncology | Admitting: Hematology and Oncology

## 2020-03-30 DIAGNOSIS — Z853 Personal history of malignant neoplasm of breast: Secondary | ICD-10-CM

## 2020-03-30 DIAGNOSIS — R928 Other abnormal and inconclusive findings on diagnostic imaging of breast: Secondary | ICD-10-CM | POA: Diagnosis not present

## 2020-04-08 DIAGNOSIS — L820 Inflamed seborrheic keratosis: Secondary | ICD-10-CM | POA: Diagnosis not present

## 2020-04-08 DIAGNOSIS — D485 Neoplasm of uncertain behavior of skin: Secondary | ICD-10-CM | POA: Diagnosis not present

## 2020-04-08 DIAGNOSIS — L814 Other melanin hyperpigmentation: Secondary | ICD-10-CM | POA: Diagnosis not present

## 2020-04-08 DIAGNOSIS — L821 Other seborrheic keratosis: Secondary | ICD-10-CM | POA: Diagnosis not present

## 2020-04-12 DIAGNOSIS — E039 Hypothyroidism, unspecified: Secondary | ICD-10-CM | POA: Diagnosis not present

## 2020-04-12 DIAGNOSIS — E1165 Type 2 diabetes mellitus with hyperglycemia: Secondary | ICD-10-CM | POA: Diagnosis not present

## 2020-04-12 DIAGNOSIS — I25119 Atherosclerotic heart disease of native coronary artery with unspecified angina pectoris: Secondary | ICD-10-CM | POA: Diagnosis not present

## 2020-04-12 DIAGNOSIS — E785 Hyperlipidemia, unspecified: Secondary | ICD-10-CM | POA: Diagnosis not present

## 2020-04-15 DIAGNOSIS — I25119 Atherosclerotic heart disease of native coronary artery with unspecified angina pectoris: Secondary | ICD-10-CM | POA: Diagnosis not present

## 2020-04-15 DIAGNOSIS — E785 Hyperlipidemia, unspecified: Secondary | ICD-10-CM | POA: Diagnosis not present

## 2020-04-15 DIAGNOSIS — E0842 Diabetes mellitus due to underlying condition with diabetic polyneuropathy: Secondary | ICD-10-CM | POA: Diagnosis not present

## 2020-04-15 DIAGNOSIS — E039 Hypothyroidism, unspecified: Secondary | ICD-10-CM | POA: Diagnosis not present

## 2020-04-16 ENCOUNTER — Other Ambulatory Visit: Payer: Self-pay | Admitting: Hematology and Oncology

## 2020-04-16 DIAGNOSIS — C50412 Malignant neoplasm of upper-outer quadrant of left female breast: Secondary | ICD-10-CM

## 2020-04-16 DIAGNOSIS — Z170 Estrogen receptor positive status [ER+]: Secondary | ICD-10-CM

## 2020-04-24 ENCOUNTER — Other Ambulatory Visit: Payer: Self-pay

## 2020-04-24 ENCOUNTER — Encounter: Payer: Self-pay | Admitting: Emergency Medicine

## 2020-04-24 ENCOUNTER — Ambulatory Visit
Admission: EM | Admit: 2020-04-24 | Discharge: 2020-04-24 | Disposition: A | Discharge status: Home / Self Care | Payer: Medicare Other | Attending: Emergency Medicine | Admitting: Emergency Medicine

## 2020-04-24 DIAGNOSIS — N3001 Acute cystitis with hematuria: Secondary | ICD-10-CM | POA: Insufficient documentation

## 2020-04-24 LAB — POCT URINALYSIS DIP (MANUAL ENTRY)
Bilirubin, UA: NEGATIVE
Glucose, UA: NEGATIVE mg/dL
Ketones, POC UA: NEGATIVE mg/dL
Nitrite, UA: NEGATIVE
Protein Ur, POC: NEGATIVE mg/dL
Spec Grav, UA: 1.015 (ref 1.010–1.025)
Urobilinogen, UA: 0.2 E.U./dL
pH, UA: 6 (ref 5.0–8.0)

## 2020-04-24 MED ORDER — NITROFURANTOIN MONOHYD MACRO 100 MG PO CAPS
100.0000 mg | ORAL_CAPSULE | Freq: Two times a day (BID) | ORAL | 0 refills | Status: DC
Start: 2020-04-24 — End: 2020-05-10

## 2020-04-24 NOTE — ED Provider Notes (Signed)
[name]    CSN: [id number] Arrival date & time: 04/24/20  1458      History   Chief Complaint Chief Complaint  Patient presents with  . Dysuria    HPI Susan Barnett is a 74 y.o. female  Patient with history of recurrent UTI presenting for 1 day course of dysuria.  Patient has tolerated Macrobid well in the past: Requesting today.  Denies abdominal or back pain, fever, vaginal discharge or pelvic pain.  Past Medical History:  Diagnosis Date  . Anemia    mild  . Anginal pain ([place])    with exertion, last time prior to cardiac caht 06/02/14  . Anxiety    situational  . Arthritis   . Breast cancer ([name]) 2018   Left Breast Cancer  . CAD (coronary artery disease)    Dr. Linard Millers follows, no problems now  . Cancer [facility])    Breast cancer  . Complication of anesthesia   . Diabetes mellitus, type 2 ([name])    TYPE 2  . Dysrhythmia    occ skips a beat, rate was fast before beta blocker.  . Gastric polyp   . Genetic testing 04/12/2017   Ms. [name] underwent genetic counseling and testing for hereditary cancer syndromes on 02/19/2017. Her results were negative for mutations in all 46 genes analyzed by [employer]'s 46-gene Common Hereditary Cancers Panel. Genes analyzed include: APC, ATM, AXIN2, BARD1, BMPR1A, BRCA1, BRCA2, BRIP1, CDH1, CDKN2A, CHEK2, CTNNA1, DICER1, EPCAM, GREM1, HOXB13, KIT, MEN1, MLH1, MSH2, MSH3, MSH6, MUTYH, NBN  . GERD (gastroesophageal reflux disease)   . Headache    hx of  . Heartburn   . Hiatal hernia    mild head tremors  . History of radiation therapy 01/30/17- 02/21/17   Left Breast 42.56 Gy in 16 fractions  . Hypertension   . Hypothyroidism   . Occasional tremors    "of head", slight hands  . Osteopenia   . Personal history of radiation therapy 2018   Left Breast Cancer  . Pneumonia   . PONV (postoperative nausea and vomiting) 02/24/14  . Shortness of breath    with exertion  . Sleep apnea    cpap used nightly x 2 yrs now.     Patient Active Problem List   Diagnosis Date Noted  . Hyponatremia 12/18/2019  . Iron deficiency anemia 08/20/2017  . OSA (obstructive sleep apnea) 05/16/2017  . Genetic testing 04/12/2017  . Malignant neoplasm of upper-outer quadrant of left breast in female, estrogen receptor positive ([name]) 10/15/2016  . Hyperlipidemia 07/14/2015  . Coronary artery disease involving coronary bypass graft of native heart with angina pectoris ([name]) 06/14/2014  . Lateral meniscal tear 02/23/2014  . GASTRIC POLYP 11/19/2007  . Hypothyroidism 11/19/2007  . Type 2 diabetes mellitus with complications ([name]) 78/29/5621  . Essential hypertension 11/19/2007  . GERD 11/19/2007  . HIATAL HERNIA 11/19/2007  . RECTAL BLEEDING 11/19/2007    Past Surgical History:  Procedure Laterality Date  . BREAST BIOPSY Left 1987  . BREAST BIOPSY Left 2019  . BREAST LUMPECTOMY Left 01/25/2017  . BREAST LUMPECTOMY WITH RADIOACTIVE SEED AND SENTINEL LYMPH NODE BIOPSY Left 12/25/2016   Procedure: BREAST LUMPECTOMY WITH RADIOACTIVE SEED AND SENTINEL LYMPH NODE BIOPSY;  Surgeon: [name], MD;  Location: [facility];  Service: General;  Laterality: Left;  . CARDIAC CATHETERIZATION  06/02/2014   LAD 30%, D1 80%, CFX 755, RCA > 95% ISR, rx w/ PTCA, heavy calcification, EF 65%  . CORONARY ARTERY BYPASS  GRAFT N/A 06/14/2014   Procedure: CORONARY ARTERY BYPASS GRAFTING (CABG);  Surgeon: Gaye Pollack, MD;  Location: [facility];  Service: Open Heart Surgery;  Laterality: N/A;  Times 2 using endoscopically harvested right saphenous vein.  . coronary stents     x3 stents placed-prior ablation  . DILATION AND CURETTAGE OF UTERUS    . ELBOW FRACTURE SURGERY Left   . INCONTINENCE SURGERY     sling  . KNEE ARTHROSCOPY Right 02/24/2014   Procedure: RIGHT KNEE ARTHROSCOPY WITH DEBRIDEMENT ;  Surgeon: Gearlean Alf, MD;  Location: [facility];  Service: Orthopedics;  Laterality: Right;  . LEFT HEART CATHETERIZATION WITH CORONARY ANGIOGRAM N/A  06/02/2014   Procedure: LEFT HEART CATHETERIZATION WITH CORONARY ANGIOGRAM;  Surgeon: Sinclair Grooms, MD;  Location: [facility] C[facility];  Service: Cardiovascular;  Laterality: N/A;  . TEE WITHOUT CARDIOVERSION N/A 06/14/2014   Procedure: TRANSESOPHAGEAL ECHOCARDIOGRAM (TEE);  Surgeon: Gaye Pollack, MD;  Location: [facility];  Service: Open Heart Surgery;  Laterality: N/A;    OB History   No obstetric history on file.      Home Medications    Prior to Admission medications   Medication Sig Start Date End Date Taking? Authorizing Provider  amLODipine (NORVASC) 10 MG tablet Take 1 tablet (10 mg total) by mouth daily. 12/22/19 02/08/20  [name], MD  anastrozole (ARIMIDEX) 1 MG tablet Take 1 tablet by mouth once daily 04/19/20   [name], MD  atorvastatin (LIPITOR) 10 MG tablet Take 10 mg by mouth every evening.     [provider]  CALCIUM PO Take 1 tablet by mouth in the morning and at bedtime.    [provider]  cetirizine (ZYRTEC) 10 MG tablet Take 10 mg by mouth daily. Patient not taking: Reported on 02/08/2020    [provider]  cloNIDine (CATAPRES) 0.1 MG tablet Take 1 tablet by mouth twice daily 01/06/20   [name], MD  clopidogrel (PLAVIX) 75 MG tablet Take 75 mg by mouth daily.    [provider]  Coenzyme Q10 300 MG CAPS Take 300 mg by mouth 2 (two) times daily.    [provider]  Cranberry 1000 MG CAPS Take by mouth.    [provider]  diphenhydramine-acetaminophen (TYLENOL PM) 25-500 MG TABS tablet Take 1 tablet by mouth at bedtime as needed (For pain).     [provider]  LANTUS SOLOSTAR 100 UNIT/ML Solostar Pen Inject 40 Units into the skin at bedtime. Patient taking differently: Inject 44 Units into the skin at bedtime.  05/23/18   [name], MD  levothyroxine (SYNTHROID, LEVOTHROID) 75 MCG tablet Take 75 mcg by mouth daily before breakfast.     [provider]  Loratadine (CLARITIN) 10 MG  CAPS Take by mouth.    [provider]  losartan (COZAAR) 100 MG tablet Take 1 tablet (100 mg total) by mouth daily. 10/28/19   [name], MD  metoprolol (LOPRESSOR) 50 MG tablet Take 1 tablet (50 mg total) by mouth 2 (two) times daily. 09/08/14   [name], MD  Multiple Vitamin (MULTIVITAMIN ADULT PO) Take by mouth.    [provider]  nitrofurantoin, macrocrystal-monohydrate, (MACROBID) 100 MG capsule Take 1 capsule (100 mg total) by mouth 2 (two) times daily. 04/24/20   Hall-Potvin, [place], PA-C  nitroGLYCERIN (NITROSTAT) 0.4 MG SL tablet Place 1 tablet (0.4 mg total) under the tongue every 5 (five) minutes as needed for chest pain. 08/29/18  02/08/20  [name], MD  pantoprazole (PROTONIX) 40 MG tablet Take 1 tablet (40 mg total) by mouth daily before breakfast. 09/08/14   [name], MD  Polyethyl Glycol-Propyl Glycol (SYSTANE OP) Place 1 drop into both eyes as needed.     [provider]  potassium chloride (KLOR-CON) 10 MEQ tablet Take 1 tablet (10 mEq total) by mouth daily. 10/19/19 02/08/20  [name], MD  sitaGLIPtin-metformin (JANUMET) 50-1000 MG tablet Take 1 tablet by mouth 2 (two) times daily with a meal.    [provider]  traMADol (ULTRAM) 50 MG tablet Take 1 tablet (50 mg total) by mouth every 6 (six) hours as needed. 12/21/19   [name], MD    Family History Family History  Problem Relation Age of Onset  . Heart attack Mother   . Hypertension Mother   . Diabetes Mother   . Lung cancer Brother        d.57 from metastatic disease. History of smoking.  Marland Kitchen Healthy Brother   . Breast cancer Cousin 30       d.30s  . Hodgkin's lymphoma Maternal Uncle        d.62s  . Ulcerative colitis Daughter     Social History Social History   Tobacco Use  . Smoking status: Never Smoker  . Smokeless tobacco: Never Used  Vaping Use  . Vaping Use: Never used  Substance Use Topics  . Alcohol use: No  . Drug use: No      Allergies   Asa [aspirin], Compazine [prochlorperazine edisylate], Nsaids, Shellfish allergy, Sulfa antibiotics, Zantac [ranitidine hcl], and Lisinopril   Review of Systems As per HPI   Physical Exam Triage Vital Signs ED Triage Vitals [04/24/20 1605]  Enc Vitals Group     BP (!) 170/75     Pulse Rate 70     Resp 18     Temp 98.2 F (36.8 C)     Temp Source Oral     SpO2 95 %     Weight      Height      Head Circumference      Peak Flow      Pain Score 3     Pain Loc      Pain Edu?      Excl. in [place]?    No data found.  Updated Vital Signs BP (!) 170/75 (BP Location: Right Arm)   Pulse 70   Temp 98.2 F (36.8 C) (Oral)   Resp 18   SpO2 95%   Visual Acuity Right Eye Distance:   Left Eye Distance:   Bilateral Distance:    Right Eye Near:   Left Eye Near:    Bilateral Near:     Physical Exam Constitutional:      General: She is not in acute distress. HENT:     Head: Normocephalic and atraumatic.  Eyes:     General: No scleral icterus.    Pupils: Pupils are equal, round, and reactive to light.  Cardiovascular:     Rate and Rhythm: Normal rate.  Pulmonary:     Effort: Pulmonary effort is normal.  Abdominal:     General: Bowel sounds are normal.     Palpations: Abdomen is soft.     Tenderness: There is no abdominal tenderness. There is no right CVA tenderness, left CVA tenderness or guarding.  Skin:    Coloration: Skin is not jaundiced or pale.  Neurological:  Mental Status: She is alert and oriented to person, place, and time.      UC Treatments / Results  Labs (all labs ordered are listed, but only abnormal results are displayed) Labs Reviewed  POCT URINALYSIS DIP (MANUAL ENTRY) - Abnormal; Notable for the following components:      Result Value   Blood, UA trace-intact (*)    Leukocytes, UA Small (1+) (*)    All other components within normal limits  URINE CULTURE    EKG   Radiology No results  found.  Procedures Procedures (including critical care time)  Medications Ordered in UC Medications - No data to display  Initial Impression / Assessment and Plan / UC Course  I have reviewed the triage vital signs and the nursing notes.  Pertinent labs & imaging results that were available during my care of the patient were reviewed by me and considered in my medical decision making (see chart for details).     Patient afebrile, nontoxic in office today.  Urine dipstick with leukocytes, blood.  Culture pending.  Previous culture from 03/13/2020 with pansensitive E. coli.  Will start Macrobid she has tolerated well in the past today.  Return precautions discussed, pt verbalized understanding and is agreeable to plan. Final Clinical Impressions(s) / UC Diagnoses   Final diagnoses:  Acute cystitis with hematuria     Discharge Instructions     Take antibiotic twice daily with food. Important to drink plenty of water throughout the day. Return for worsening urinary symptoms, blood in urine, abdominal or back pain, fever.    ED Prescriptions    Medication Sig Dispense Auth. Provider   nitrofurantoin, macrocrystal-monohydrate, (MACROBID) 100 MG capsule Take 1 capsule (100 mg total) by mouth 2 (two) times daily. 10 capsule Hall-Potvin, [place], PA-C     PDMP not reviewed this encounter.   Hall-Potvin, [place], [place] 04/24/20 1635

## 2020-04-24 NOTE — ED Triage Notes (Signed)
Pt here for dysuria x 1 day with hx of similar

## 2020-04-24 NOTE — Discharge Instructions (Signed)
Take antibiotic twice daily with food. Important to drink plenty of water throughout the day. Return for worsening urinary symptoms, blood in urine, abdominal or back pain, fever. 

## 2020-04-27 LAB — URINE CULTURE: Culture: >=100000 — AB

## 2020-04-29 DIAGNOSIS — N390 Urinary tract infection, site not specified: Secondary | ICD-10-CM | POA: Diagnosis not present

## 2020-04-29 DIAGNOSIS — Z23 Encounter for immunization: Secondary | ICD-10-CM | POA: Diagnosis not present

## 2020-05-09 ENCOUNTER — Telehealth: Payer: Self-pay | Admitting: Interventional Cardiology

## 2020-05-09 NOTE — Telephone Encounter (Signed)
New message   Pt c/o BP issue: STAT if pt c/o blurred vision, one-sided weakness or slurred speech  1. What are your last 5 BP readings?  169/87 this morning  130's / 80s all week even on bp meds   2. Are you having any other symptoms (ex. Dizziness, headache, blurred vision, passed out)? Headache , neck ache  3. What is your BP issue? Pt stated this started a week 1/2 ago and slowly worse.       Pt c/o Shortness Of Breath: STAT if SOB developed within the last 24 hours or pt is noticeably SOB on the phone  1. Are you currently SOB (can you hear that pt is SOB on the phone)? no  2. How long have you been experiencing SOB? When she lays down at night or if she carries something heavy.    3. Are you SOB when sitting or when up moving around? Both   4. Are you currently experiencing any other symptoms? Pt state that she feels worse at night and when she wakes up in the morning it is a little better.  She would like to get in to been seen.  She states she was in the hosp this past may for a fall and has not followed up   Best number [phone]

## 2020-05-09 NOTE — Progress Notes (Signed)
CARDIOLOGY OFFICE NOTE  Date:  05/10/2020    Susan Barnett Date of Birth: 1945-10-19 Medical Record #[id number]  PCP:  Shirline Frees, MD  Cardiologist:  Tamala Julian  Chief Complaint  Patient presents with  . Follow-up    Work in visit - seen for Dr. Tamala Julian    History of Present Illness: Susan Barnett is a 74 y.o. female who presents today for a work in visit. Seen for Dr. Tamala Julian.   She has a history of known CAD with subsequent CABG x 2 in 2015 per Dr. Cyndia Bent for ISR of the RCA, HTN, DM, HLD and OSA on CPAP.   She was last seen by Dr. Tamala Julian in March - had had some DOE and sporadic elevation in BP. Saw Dr. Radford Pax in June for her sleep/OSA.   Phone call yesterday - "Pt states she fell in May and medications were changed after being hospitalized.  States she was on 2 diuretics at that time but they took her off of both.  BPs were fine for awhile but have recently started trending up.  BPs usually 110-120s on top but recent BPs have been 130s/80s.  This morning BP was 169/87 after medication.  Feels good this morning but generally concerned about overall readings.  Feet swell a little and improve overnight.  Has trouble breathing when she lays down at night.  HR usually in the 70s but was 95 last night.  When laying down has chest discomfort.  Develops discomfort in neck, shoulders and back sometimes.  No issues today.  Scheduled her to see Truitt Merle, NP tomorrow.  Advised when appropriate to go to ER.  Pt appreciative for call."  Thus added to my schedule for today.    Comes in today. Here alone. Fell back in May because her robot vacuum was coming out the door. She hit her chin and chest - she got concerned and called EMS. Could not get up off the floor. Sodium was reportedly low. Has been on fluid restriction ever since - this has caused several UTI's. She notes now diffuse aching in her chest recently - hard for her to discern/describe - it is better today. BP and HR high. BP is  ok here today but HR is higher than her normal.  She says she is not able to tell what is coming from her heart or arthritis. Sounds like she had some jaw pain as well. Now off Aldactone and HCTZ due to the low sodium.  She is hopeful she can increase her po intake of water. Some occasional swelling but typically this goes down overnight.   Past Medical History:  Diagnosis Date  . Anemia    mild  . Anginal pain ([place])    with exertion, last time prior to cardiac caht 06/02/14  . Anxiety    situational  . Arthritis   . Breast cancer ([place]) 2018   Left Breast Cancer  . CAD (coronary artery disease)    Dr. Linard Millers follows, no problems now  . Cancer [facility])    Breast cancer  . Complication of anesthesia   . Diabetes mellitus, type 2 ([place])    TYPE 2  . Dysrhythmia    occ skips a beat, rate was fast before beta blocker.  . Gastric polyp   . Genetic testing 04/12/2017   Ms. Antigua underwent genetic counseling and testing for hereditary cancer syndromes on 02/19/2017. Her results were negative for mutations in all 46 genes analyzed  by [facility]'s 46-gene Common Hereditary Cancers Panel. Genes analyzed include: APC, ATM, AXIN2, BARD1, BMPR1A, BRCA1, BRCA2, BRIP1, CDH1, CDKN2A, CHEK2, CTNNA1, DICER1, EPCAM, GREM1, HOXB13, KIT, MEN1, MLH1, MSH2, MSH3, MSH6, MUTYH, NBN  . GERD (gastroesophageal reflux disease)   . Headache    hx of  . Heartburn   . Hiatal hernia    mild head tremors  . History of radiation therapy 01/30/17- 02/21/17   Left Breast 42.56 Gy in 16 fractions  . Hypertension   . Hypothyroidism   . Occasional tremors    "of head", slight hands  . Osteopenia   . Personal history of radiation therapy 2018   Left Breast Cancer  . Pneumonia   . PONV (postoperative nausea and vomiting) 02/24/14  . Shortness of breath    with exertion  . Sleep apnea    cpap used nightly x 2 yrs now.    Past Surgical History:  Procedure Laterality Date  . BREAST BIOPSY Left 1987  . BREAST BIOPSY  Left 2019  . BREAST LUMPECTOMY Left 01/25/2017  . BREAST LUMPECTOMY WITH RADIOACTIVE SEED AND SENTINEL LYMPH NODE BIOPSY Left 12/25/2016   Procedure: BREAST LUMPECTOMY WITH RADIOACTIVE SEED AND SENTINEL LYMPH NODE BIOPSY;  Surgeon: [name], MD;  Location: [facility];  Service: General;  Laterality: Left;  . CARDIAC CATHETERIZATION  06/02/2014   LAD 30%, D1 80%, CFX 755, RCA > 95% ISR, rx w/ PTCA, heavy calcification, EF 65%  . CORONARY ARTERY BYPASS GRAFT N/A 06/14/2014   Procedure: CORONARY ARTERY BYPASS GRAFTING (CABG);  Surgeon: Gaye Pollack, MD;  Location: [facility];  Service: Open Heart Surgery;  Laterality: N/A;  Times 2 using endoscopically harvested right saphenous vein.  . coronary stents     x3 stents placed-prior ablation  . DILATION AND CURETTAGE OF UTERUS    . ELBOW FRACTURE SURGERY Left   . INCONTINENCE SURGERY     sling  . KNEE ARTHROSCOPY Right 02/24/2014   Procedure: RIGHT KNEE ARTHROSCOPY WITH DEBRIDEMENT ;  Surgeon: Gearlean Alf, MD;  Location: [facility];  Service: Orthopedics;  Laterality: Right;  . LEFT HEART CATHETERIZATION WITH CORONARY ANGIOGRAM N/A 06/02/2014   Procedure: LEFT HEART CATHETERIZATION WITH CORONARY ANGIOGRAM;  Surgeon: Sinclair Grooms, MD;  Location: [facility] CATH LAB;  Service: Cardiovascular;  Laterality: N/A;  . TEE WITHOUT CARDIOVERSION N/A 06/14/2014   Procedure: TRANSESOPHAGEAL ECHOCARDIOGRAM (TEE);  Surgeon: Gaye Pollack, MD;  Location: [facility];  Service: Open Heart Surgery;  Laterality: N/A;     Medications: Current Meds  Medication Sig  . amLODipine (NORVASC) 5 MG tablet Take 5 mg by mouth daily.  Marland Kitchen anastrozole (ARIMIDEX) 1 MG tablet Take 1 tablet by mouth once daily  . atorvastatin (LIPITOR) 10 MG tablet Take 10 mg by mouth every evening.   Marland Kitchen CALCIUM PO Take 1 tablet by mouth in the morning and at bedtime.  . cetirizine (ZYRTEC) 10 MG tablet Take 10 mg by mouth daily.   . cloNIDine (CATAPRES) 0.1 MG tablet Take 1 tablet by mouth twice daily   . clopidogrel (PLAVIX) 75 MG tablet Take 75 mg by mouth daily.  . Coenzyme Q10 300 MG CAPS Take 300 mg by mouth 2 (two) times daily.  . Cranberry 1000 MG CAPS Take by mouth.  . diphenhydramine-acetaminophen (TYLENOL PM) 25-500 MG TABS tablet Take 1 tablet by mouth at bedtime as needed (For pain).   Marland Kitchen LANTUS SOLOSTAR 100 UNIT/ML Solostar Pen Inject 40 Units into the skin at bedtime.  Marland Kitchen  levothyroxine (SYNTHROID, LEVOTHROID) 75 MCG tablet Take 75 mcg by mouth daily before breakfast.   . Loratadine (CLARITIN) 10 MG CAPS Take by mouth.  . losartan (COZAAR) 100 MG tablet Take 1 tablet (100 mg total) by mouth daily.  . metoprolol tartrate (LOPRESSOR) 50 MG tablet Take 1.5 tablets (75 mg total) by mouth 2 (two) times daily.  . Multiple Vitamin (MULTIVITAMIN ADULT PO) Take by mouth.  Glory Rosebush VERIO test strip 1 each 2 (two) times daily.  . pantoprazole (PROTONIX) 40 MG tablet Take 1 tablet (40 mg total) by mouth daily before breakfast.  . Polyethyl Glycol-Propyl Glycol (SYSTANE OP) Place 1 drop into both eyes as needed.   . sitaGLIPtin-metformin (JANUMET) 50-1000 MG tablet Take 1 tablet by mouth 2 (two) times daily with a meal.  . [DISCONTINUED] metoprolol (LOPRESSOR) 50 MG tablet Take 1 tablet (50 mg total) by mouth 2 (two) times daily.     Allergies: Allergies  Allergen Reactions  . Asa [Aspirin] Anaphylaxis  . Compazine [Prochlorperazine Edisylate] Swelling and Other (See Comments)    Tongue swelling   . Nsaids Hives and Swelling  . Shellfish Allergy Hives and Swelling  . Sulfa Antibiotics Hives and Swelling  . Zantac [Ranitidine Hcl] Hives and Swelling  . Lisinopril Cough    Social History: The patient  reports that she has never smoked. She has never used smokeless tobacco. She reports that she does not drink alcohol and does not use drugs.   Family History: The patient's family history includes Breast cancer (age of onset: 59) in her cousin; Diabetes in her mother; Healthy in her  brother; Heart attack in her mother; Hodgkin's lymphoma in her maternal uncle; Hypertension in her mother; Lung cancer in her brother; Ulcerative colitis in her daughter.   Review of Systems: Please see the history of present illness.   All other systems are reviewed and negative.   Physical Exam: VS:  BP 128/70   Pulse 90   Ht 5' 2"  (1.575 m)   Wt 158 lb 12.8 oz (72 kg)   SpO2 97%   BMI 29.04 kg/m  .  BMI Body mass index is 29.04 kg/m.  Wt Readings from Last 3 Encounters:  05/10/20 158 lb 12.8 oz (72 kg)  02/08/20 157 lb (71.2 kg)  12/19/19 160 lb 15 oz (73 kg)    General: Alert and in no acute distress.   Cardiac: Regular rate and rhythm. Her rate is faster today. No murmurs, rubs, or gallops. No significant edema.  Respiratory:  Lungs are clear to auscultation bilaterally with normal work of breathing.  GI: Soft and nontender.  MS: No deformity or atrophy. Gait and ROM intact.  Skin: Warm and dry. Color is normal.  Neuro:  Strength and sensation are intact and no gross focal deficits noted.  Psych: Alert, appropriate and with normal affect.   LABORATORY DATA:  EKG:  EKG is ordered today.  Personally reviewed by me. This demonstrates NSR - inferior Q's - tracing is unchanged other than faster HR.   Lab Results  Component Value Date   WBC 11.9 (H) 12/20/2019   HGB 12.6 12/20/2019   HCT 36.3 12/20/2019   PLT 328 12/20/2019   GLUCOSE 164 (H) 12/21/2019   CHOL  08/13/2007    116        ATP III CLASSIFICATION:  <200     mg/dL   Desirable  200-239  mg/dL   Borderline High  >=240    mg/dL   High  TRIG 69 08/13/2007   HDL 41 08/13/2007   LDLCALC  08/13/2007    61        Total Cholesterol/HDL:CHD Risk Coronary Heart Disease Risk Table                     Men   Women  1/2 Average Risk   3.4   3.3   ALT 18 12/21/2019   AST 18 12/21/2019   NA 131 (L) 12/21/2019   K 4.0 12/21/2019   CL 98 12/21/2019   CREATININE 0.59 12/21/2019   BUN 10 12/21/2019   CO2 23  12/21/2019   TSH 2.233 12/18/2019   INR 1.59 (H) 06/14/2014   HGBA1C 8.4 (H) 12/18/2019       BNP (last 3 results) No results for input(s): BNP in the last 8760 hours.  ProBNP (last 3 results) No results for input(s): PROBNP in the last 8760 hours.   Other Studies Reviewed Today:  Myoview Study Highlights 08/2018    The left ventricular ejection fraction is hyperdynamic (>65%).  Nuclear stress EF: 67%.  There was no ST segment deviation noted during stress.  The study is normal.  This is a low risk study.   Normal stress nuclear study with no ischemia or infarction; EF 67 with normal wall motion.   Echo Study Conclusions 08/2018  - Left ventricle: The cavity size was normal. Wall thickness was  increased in a pattern of mild LVH. There was mild focal basal  hypertrophy of the septum. Systolic function was normal. The  estimated ejection fraction was in the range of 55% to 60%. Wall  motion was normal; there were no regional wall motion  abnormalities. Features are consistent with a pseudonormal left  ventricular filling pattern, with concomitant abnormal relaxation  and increased filling pressure (grade 2 diastolic dysfunction).  Doppler parameters are consistent with high ventricular filling  pressure.  - Aortic valve: There was trivial regurgitation.  - Mitral valve: Calcified annulus. There was mild regurgitation.  - Left atrium: The atrium was mildly dilated.  - Pulmonary arteries: Systolic pressure was mildly increased. PA  peak pressure: 32 mm Hg (S).      ASSESSMENT & PLAN:    1. Chest pain - known CAD - will get her Myoview updated. Increasing Lopressor as well today.   2. HTN - with reports of increasing BP - recheck by me is also good - 13080 - I have increased however her Lopressor to get her HR a little lower.   3. Known CAD with prior CABG in 2015 following ISR of the RCA - see #1.   4. HLD   5. OSA  6. Prior fall -  probably due to hyponatremia - needs labs today. She is still on some potassium - no longer on any diuretic therapy.   7. DM - has not tolerated SGLT2 therapy in the past.   Current medicines are reviewed with the patient today.  The patient does not have concerns regarding medicines other than what has been noted above.  The following changes have been made:  See above.  Labs/ tests ordered today include:    Orders Placed This Encounter  Procedures  . Basic metabolic panel  . CBC  . MYOCARDIAL PERFUSION IMAGING     Disposition:   FU with [place] in about a month. Lopressor is increased today. Lab today. Arranging Myoview. Further disposition to follow.   Patient is agreeable to this plan and will call if  any problems develop in the interim.   SignedTruitt Merle, NP  05/10/2020 3:52 PM  [facility] [address] [place], [place]  [address] Phone: [phone] Fax: [phone]

## 2020-05-09 NOTE — Telephone Encounter (Signed)
Pt states she fell in May and medications were changed after being hospitalized.  States she was on 2 diuretics at that time but they took her off of both.  BPs were fine for awhile but have recently started trending up.  BPs usually 110-120s on top but recent BPs have been 130s/80s.  This morning BP was 169/87 after medication.  Feels good this morning but generally concerned about overall readings.  Feet swell a little and improve overnight.  Has trouble breathing when she lays down at night.  HR usually in the 70s but was 95 last night.  When laying down has chest discomfort.  Develops discomfort in neck, shoulders and back sometimes.  No issues today.  Scheduled her to see Truitt Merle, NP tomorrow.  Advised when appropriate to go to ER.  Pt appreciative for call.

## 2020-05-10 ENCOUNTER — Other Ambulatory Visit: Payer: Self-pay

## 2020-05-10 ENCOUNTER — Encounter: Payer: Self-pay | Admitting: Nurse Practitioner

## 2020-05-10 ENCOUNTER — Ambulatory Visit: Payer: Medicare Other | Admitting: Nurse Practitioner

## 2020-05-10 VITALS — BP 128/70 | HR 90 | Ht 62.0 in | Wt 158.8 lb

## 2020-05-10 DIAGNOSIS — I10 Essential (primary) hypertension: Secondary | ICD-10-CM | POA: Diagnosis not present

## 2020-05-10 DIAGNOSIS — R0602 Shortness of breath: Secondary | ICD-10-CM | POA: Diagnosis not present

## 2020-05-10 DIAGNOSIS — I25709 Atherosclerosis of coronary artery bypass graft(s), unspecified, with unspecified angina pectoris: Secondary | ICD-10-CM

## 2020-05-10 DIAGNOSIS — I2510 Atherosclerotic heart disease of native coronary artery without angina pectoris: Secondary | ICD-10-CM

## 2020-05-10 DIAGNOSIS — E782 Mixed hyperlipidemia: Secondary | ICD-10-CM

## 2020-05-10 MED ORDER — METOPROLOL TARTRATE 50 MG PO TABS: 75.0000 mg | ORAL_TABLET | Freq: Two times a day (BID) | ORAL | 3 refills | Status: DC

## 2020-05-10 NOTE — Patient Instructions (Addendum)
After Visit Summary:  We will be checking the following labs today - BMET and CBC   Medication Instructions:    Continue with your current medicines.   Verify your dose of Norvasc - are you on 5 mg of 10 mg  Increase the Lopressor to 75 mg twice a day - this will be a pill and a half twice a day - I have sent this to your pharmacy.    If you need a refill on your cardiac medications before your next appointment, please call your pharmacy.     Testing/Procedures To Be Arranged:  Lexiscan   You are scheduled for a Myocardial Perfusion Imaging Study on __________________________________ at _________________________________.   Please arrive 15 minutes prior to your appointment time for registration and insurance purposes.   The test will take approximately 3 to 4 hours to complete; you may bring reading material. If someone comes with you to your appointment, they will need to remain in the main lobby due to limited space in the testing area.   How to prepare for your Myocardial Perfusion test:   Do not eat or drink 3 hours prior to your test, except you may have water.    Do not consume products containing caffeine (regular or decaffeinated) 12 hours prior to your test (ex: coffee, chocolate, soda, tea)   Do bring a list of your current medications with you. If not listed below, you may take your medications as normal.    Bring any held medication to your appointment, as you may be required to take it once the test is complete.   Do wear comfortable clothes (no dresses or overalls) and walking shoes. Tennis shoes are preferred. No heels or open toed shoes.  Do not wear cologne, perfume, aftershave or lotions (deodorant is allowed).   If these instructions are not followed, you test will have to be rescheduled.   Please report to [address] for your test. If you have questions or concerns about your appointment, please call the Nuclear Lab at  [phone].  If you cannot keep your appointment, please provide 24 hour notification to the Nuclear lab to avoid a possible $50 charge to your account.       Follow-Up:   See me or Dr. Tamala Julian in about a month for follow up.     At [facility], you and your health needs are our priority.  As part of our continuing mission to provide you with exceptional heart care, we have created designated Provider Care Teams.  These Care Teams include your primary Cardiologist (physician) and Advanced Practice Providers (APPs -  Physician Assistants and Nurse Practitioners) who all work together to provide you with the care you need, when you need it.  Special Instructions:  . Stay safe, wash your hands for at least 20 seconds and wear a mask when needed.  . It was good to talk with you today.    Call the [facility] office at [phone] if you have any questions, problems or concerns.

## 2020-05-11 ENCOUNTER — Telehealth (HOSPITAL_COMMUNITY): Payer: Self-pay | Admitting: *Deleted

## 2020-05-11 ENCOUNTER — Other Ambulatory Visit: Payer: Self-pay | Admitting: *Deleted

## 2020-05-11 ENCOUNTER — Telehealth: Payer: Self-pay | Admitting: Interventional Cardiology

## 2020-05-11 ENCOUNTER — Encounter (HOSPITAL_COMMUNITY): Payer: Self-pay | Admitting: *Deleted

## 2020-05-11 LAB — BASIC METABOLIC PANEL
BUN/Creatinine Ratio: 22 (ref 12–28)
BUN: 19 mg/dL (ref 8–27)
CO2: 25 mmol/L (ref 20–29)
Calcium: 9.7 mg/dL (ref 8.7–10.3)
Chloride: 104 mmol/L (ref 96–106)
Creatinine, Ser: 0.85 mg/dL (ref 0.57–1.00)
GFR calc Af Amer: 78 mL/min/{1.73_m2} (ref >59)
GFR calc non Af Amer: 67 mL/min/{1.73_m2} (ref >59)
Glucose: 180 mg/dL — ABNORMAL HIGH (ref 65–99)
Potassium: 4.5 mmol/L (ref 3.5–5.2)
Sodium: 140 mmol/L (ref 134–144)

## 2020-05-11 LAB — CBC
Hematocrit: 37.2 % (ref 34.0–46.6)
Hemoglobin: 12.1 g/dL (ref 11.1–15.9)
MCH: 29.4 pg (ref 26.6–33.0)
MCHC: 32.5 g/dL (ref 31.5–35.7)
MCV: 90 fL (ref 79–97)
Platelets: 341 10*3/uL (ref 150–450)
RBC: 4.12 x10E6/uL (ref 3.77–5.28)
RDW: 12.3 % (ref 11.7–15.4)
WBC: 11.3 10*3/uL — ABNORMAL HIGH (ref 3.4–10.8)

## 2020-05-11 NOTE — Telephone Encounter (Signed)
Pt is scheduled to get her COVID booster tomorrow (05/12/20) and wanted to know if that would interfere with her upcoming stress test 05/19/20. Please let the patient know if she can still get her COVID booster tomorrow or if she needs to reschedule the booster

## 2020-05-11 NOTE — Addendum Note (Signed)
Addended by: Lanna Poche R on: 05/11/2020 01:25 PM   Modules accepted: Orders

## 2020-05-11 NOTE — Telephone Encounter (Signed)
Left message on voicemail per DPR in reference to upcoming appointment scheduled on 05/18/2020 at 1000 with detailed instructions given per Myocardial Perfusion Study Information Sheet for the test. LM to arrive 15 minutes early, and that it is imperative to arrive on time for appointment to keep from having the test rescheduled. If you need to cancel or reschedule your appointment, please call the office within 24 hours of your appointment. Failure to do so may result in a cancellation of your appointment, and a $50 no show fee. Phone number given for call back for any questions. Mychart letter sent with instructions.Ramere Downs, Ranae [place]ms

## 2020-05-12 ENCOUNTER — Other Ambulatory Visit: Payer: Self-pay | Admitting: *Deleted

## 2020-05-16 DIAGNOSIS — Z0120 Encounter for dental examination and cleaning without abnormal findings: Secondary | ICD-10-CM | POA: Diagnosis not present

## 2020-05-18 ENCOUNTER — Ambulatory Visit (HOSPITAL_COMMUNITY): Payer: Medicare Other | Attending: Internal Medicine

## 2020-05-18 ENCOUNTER — Other Ambulatory Visit: Payer: Self-pay

## 2020-05-18 DIAGNOSIS — I25709 Atherosclerosis of coronary artery bypass graft(s), unspecified, with unspecified angina pectoris: Secondary | ICD-10-CM | POA: Diagnosis not present

## 2020-05-18 DIAGNOSIS — R0602 Shortness of breath: Secondary | ICD-10-CM | POA: Insufficient documentation

## 2020-05-18 LAB — MYOCARDIAL PERFUSION IMAGING
LV dias vol: 59 mL (ref 46–106)
LV sys vol: 17 mL
Peak HR: 103 {beats}/min
Rest HR: 79 {beats}/min
SDS: 0
SRS: 0
SSS: 0
TID: 0.92

## 2020-05-18 MED ORDER — TECHNETIUM TC 99M TETROFOSMIN IV KIT
10.9000 | PACK | Freq: Once | INTRAVENOUS | Status: AC | PRN
  Administered 2020-05-18: 10.9 via INTRAVENOUS
  Filled 2020-05-18: qty 11

## 2020-05-18 MED ORDER — REGADENOSON 0.4 MG/5ML IV SOLN
0.4000 mg | Freq: Once | INTRAVENOUS | Status: AC
  Administered 2020-05-18: 0.4 mg via INTRAVENOUS

## 2020-05-18 MED ORDER — TECHNETIUM TC 99M TETROFOSMIN IV KIT
31.6000 | PACK | Freq: Once | INTRAVENOUS | Status: AC | PRN
  Administered 2020-05-18: 31.6 via INTRAVENOUS
  Filled 2020-05-18: qty 32

## 2020-05-19 ENCOUNTER — Telehealth: Payer: Self-pay

## 2020-05-19 ENCOUNTER — Telehealth: Payer: Self-pay | Admitting: Nurse Practitioner

## 2020-05-19 DIAGNOSIS — I10 Essential (primary) hypertension: Secondary | ICD-10-CM

## 2020-05-19 NOTE — Telephone Encounter (Signed)
New message ° ° ° ° ° °Returning a call to the nurse to get stress test results °

## 2020-05-19 NOTE — Telephone Encounter (Signed)
Called pt to give her stress test results and she c/o still having occasional dyspnea, elevated HR (highest 103), and high bp (highest 179/90). APP has been made aware.

## 2020-05-20 MED ORDER — METOPROLOL TARTRATE 50 MG PO TABS: 100.0000 mg | ORAL_TABLET | Freq: Two times a day (BID) | ORAL | 3 refills | Status: DC

## 2020-05-20 NOTE — Telephone Encounter (Signed)
Spoke with pt and advised per Susan Merle, NP increase Lopressor to 100mg  (2 - 50mg  tablets) twice daily by mouth and keep f/u appointment with Dr Tamala Julian as scheduled.  Pt also advised to continue to monitor blood pressure and call with any additional concerns.  Pt verbalizes understanding and agrees with current plan.

## 2020-05-20 NOTE — Telephone Encounter (Signed)
She can increase her Lopressor to 100 mg BID.  She needs to keep her follow up with Dr. Tamala Julian for later this month.   Cecille Rubin

## 2020-05-24 ENCOUNTER — Telehealth: Payer: Self-pay | Admitting: Nurse Practitioner

## 2020-05-24 DIAGNOSIS — I10 Essential (primary) hypertension: Secondary | ICD-10-CM

## 2020-05-24 MED ORDER — METOPROLOL TARTRATE 50 MG PO TABS: 100.0000 mg | ORAL_TABLET | Freq: Two times a day (BID) | ORAL | 3 refills | Status: DC

## 2020-05-24 NOTE — Telephone Encounter (Signed)
Pt's medication was sent to pt's pharmacy as requested. Confirmation received.  °

## 2020-05-24 NOTE — Telephone Encounter (Signed)
   *  STAT* If patient is at the pharmacy, call can be transferred to refill team.   1. Which medications need to be refilled? (please list name of each medication and dose if known)   metoprolol tartrate (LOPRESSOR) 50 MG tablet    2. Which pharmacy/location (including street and city if local pharmacy) is medication to be sent to? [facility] - [place], [place] - [place] [address]  3. Do they need a 30 day or 90 day supply? 90 days  Please send prescription to [facility]

## 2020-05-29 NOTE — Progress Notes (Signed)
Patient Care Team: Shirline Frees, MD as PCP - General (Family Medicine) [name], MD as PCP - Cardiology (Cardiology) Sueanne Margarita, MD as PCP - Sleep Medicine (Cardiology) Delice Bison, Charlestine Massed, NP as Nurse Practitioner (Hematology and Oncology) [name], MD as Consulting Physician (Hematology and Oncology) Eppie Gibson, MD as Attending Physician (Radiation Oncology) [name], MD (Inactive) as Consulting Physician (General Surgery)  DIAGNOSIS:    ICD-10-CM   1. Malignant neoplasm of upper-outer quadrant of left breast in female, estrogen receptor positive ([place])  C50.412    Z17.0   2. Iron deficiency anemia, unspecified iron deficiency anemia type  D50.9     SUMMARY OF ONCOLOGIC HISTORY: Oncology History  Malignant neoplasm of upper-outer quadrant of left breast in female, estrogen receptor positive ([place])  10/01/2016 Initial Diagnosis   Left breast biopsy: 12:30: IDC with DCIS involving underlying papillary lesion, grade 1, ER 100%, PR 90%, Ki-67 10%, HER-2 negative ratio 1.29; irregular 1.9 cm mass axilla negative, T1 CN 0 stage IA clinical stage   12/25/2016 Surgery   Left lumpectomy: IDC grade 1, 1.5 cm, low-grade DCIS, resection margins negative, 0/2 lymph nodes negative, ER 100%, PR 100%, HER-2 negative ratio 1.29, Ki-67 10% T1 cN0 stage IA   12/25/2016 Oncotype testing   Oncotype DX score 2: Risk of recurrence 4%   01/30/2017 - 02/21/2017 Radiation Therapy   Adjuvant radiation therapy   02/2017 -  Anti-estrogen oral therapy   Anastrozole daily   03/25/2017 Genetic Testing   Genetic counseling and testing for hereditary cancer syndromes performed on 02/19/2017. Results are negative for pathogenic mutations in 46 genes analyzed by [facility]'s Common Hereditary Cancers Panel. Results are dated 03/25/2017. Genes tested: APC, ATM, AXIN2, BARD1, BMPR1A, BRCA1, BRCA2, BRIP1, CDH1, CDKN2A, CHEK2, CTNNA1, DICER1, EPCAM, GREM1, HOXB13, KIT, MEN1, MLH1, MSH2,  MSH3, MSH6, MUTYH, NBN, NF1, NTHL1, PALB2, PDGFRA, PMS2, POLD1, POLE, PTEN, RAD50, RAD51C, RAD51D, SDHA, SDHB, SDHC, SDHD, SMAD4, SMARCA4, STK11, TP53, TSC1, TSC2, and VHL.        CHIEF COMPLIANT: Follow-up of breast cancer and iron deficiency anemia  INTERVAL HISTORY: Susan Barnett is a 74 y.o. with above-mentioned history of left breast cancer treated with lumpectomy, radiation, and who is currently on anastrozole therapy. She also has a history of iron deficiency anemia and has previously received IV iron. Mammogram on 03/30/20 showed no evidence of malignancy bilaterally. She presents to the clinic today for annual follow-up.  Her major complaint today is muscle aches and pains.  She is also had a few falls recently and has dizziness if she gets up too quickly from lying down position.  Denies any lumps or nodules or pain in the breast.  ALLERGIES:  is allergic to asa [aspirin], compazine [prochlorperazine edisylate], nsaids, shellfish allergy, sulfa antibiotics, zantac [ranitidine hcl], and lisinopril.  MEDICATIONS:  Current Outpatient Medications  Medication Sig Dispense Refill  . amLODipine (NORVASC) 5 MG tablet Take 5 mg by mouth daily.    Marland Kitchen anastrozole (ARIMIDEX) 1 MG tablet Take 1 tablet by mouth once daily 90 tablet 0  . atorvastatin (LIPITOR) 10 MG tablet Take 10 mg by mouth every evening.     Marland Kitchen CALCIUM PO Take 1 tablet by mouth in the morning and at bedtime.    . cetirizine (ZYRTEC) 10 MG tablet Take 10 mg by mouth daily.     . cloNIDine (CATAPRES) 0.1 MG tablet Take 1 tablet by mouth twice daily 180 tablet 3  . clopidogrel (PLAVIX) 75 MG tablet Take  75 mg by mouth daily.    . Coenzyme Q10 300 MG CAPS Take 300 mg by mouth 2 (two) times daily.    . Cranberry 1000 MG CAPS Take by mouth.    . diphenhydramine-acetaminophen (TYLENOL PM) 25-500 MG TABS tablet Take 1 tablet by mouth at bedtime as needed (For pain).     Marland Kitchen LANTUS SOLOSTAR 100 UNIT/ML Solostar Pen Inject 40 Units into  the skin at bedtime. 15 mL 0  . levothyroxine (SYNTHROID, LEVOTHROID) 75 MCG tablet Take 75 mcg by mouth daily before breakfast.     . Loratadine (CLARITIN) 10 MG CAPS Take by mouth.    . losartan (COZAAR) 100 MG tablet Take 1 tablet (100 mg total) by mouth daily. 90 tablet 3  . metoprolol tartrate (LOPRESSOR) 50 MG tablet Take 2 tablets (100 mg total) by mouth 2 (two) times daily. 360 tablet 3  . Multiple Vitamin (MULTIVITAMIN ADULT PO) Take by mouth.    . nitroGLYCERIN (NITROSTAT) 0.4 MG SL tablet Place 1 tablet (0.4 mg total) under the tongue every 5 (five) minutes as needed for chest pain. 25 tablet 3  . ONETOUCH VERIO test strip 1 each 2 (two) times daily.    . pantoprazole (PROTONIX) 40 MG tablet Take 1 tablet (40 mg total) by mouth daily before breakfast. 90 tablet 3  . Polyethyl Glycol-Propyl Glycol (SYSTANE OP) Place 1 drop into both eyes as needed.     . sitaGLIPtin-metformin (JANUMET) 50-1000 MG tablet Take 1 tablet by mouth 2 (two) times daily with a meal.     No current facility-administered medications for this visit.    PHYSICAL EXAMINATION: ECOG PERFORMANCE STATUS: 1 - Symptomatic but completely ambulatory  Vitals:   05/30/20 1031  BP: (!) 164/62  Pulse: 87  Resp: 18  Temp: 97.7 F (36.5 C)  SpO2: 98%   Filed Weights   05/30/20 1031  Weight: 157 lb 12.8 oz (71.6 kg)    BREAST: No palpable masses or nodules in either right or left breasts. No palpable axillary supraclavicular or infraclavicular adenopathy no breast tenderness or nipple discharge. (exam performed in the presence of a chaperone)  LABORATORY DATA:  I have reviewed the data as listed CMP Latest Ref Rng & Units 05/10/2020 12/21/2019 12/20/2019  Glucose 65 - 99 mg/dL 180(H) 164(H) 126(H)  BUN 8 - 27 mg/dL 19 10 13   Creatinine 0.57 - 1.00 mg/dL 0.85 0.59 0.51  Sodium 134 - 144 mmol/L 140 131(L) 126(L)  Potassium 3.5 - 5.2 mmol/L 4.5 4.0 3.5  Chloride 96 - 106 mmol/L 104 98 90(L)  CO2 20 - 29 mmol/L 25  23 23   Calcium 8.7 - 10.3 mg/dL 9.7 8.9 9.1  Total Protein 6.5 - 8.1 g/dL - 6.9 7.0  Total Bilirubin 0.3 - 1.2 mg/dL - 0.6 0.6  Alkaline Phos 38 - 126 U/L - 69 59  AST 15 - 41 U/L - 18 18  ALT 0 - 44 U/L - 18 18    Lab Results  Component Value Date   WBC 11.3 (H) 05/10/2020   HGB 12.1 05/10/2020   HCT 37.2 05/10/2020   MCV 90 05/10/2020   PLT 341 05/10/2020   NEUTROABS 6.5 05/28/2019    ASSESSMENT & PLAN:  Malignant neoplasm of upper-outer quadrant of left breast in female, estrogen receptor positive ([place]) Left breast biopsy02/19/2018: 12:30: IDC with DCIS involving underlying papillary lesion, grade 1, ER 100%, PR 90%, Ki-67 10%, HER-2 negative ratio 1.29; irregular 1.9 cm mass axilla negative, T1 CN  0 stage IA clinical stage Left lumpectomy 12/25/2016: IDC grade 1, 1.5 cm, low-grade DCIS, resection margins negative, 0/2 lymph nodes negative, ER 100%, PR 100%, HER-2 negative ratio 1.29, Ki-67 10% T1 cN0 stage IA Oncotype DX score 2: 4% risk of recurrence  Adjuvant radiation therapy 01/30/2017-02/21/2017  Treatment plan: Anastrozole 1 mg by mouth daily 5 yearsstarted 02/21/17 stopped 05/30/2020 for arthralgias  Anastrozole Toxicities:Continues to have worsening arthralgias myalgias. I recommended that she stop anastrozole therapy at this time. I will call her in 3 weeks.  If her symptoms are better than we will consider switching her to letrozole. They are not better than she can resume and continue with anastrozole therapy.  We can then attribute the muscle aches and pains to her chronic arthritis.  Mild leukocytosis: Secondary to underlying inflammation.  No work-up necessary.  Surveillance: 1. Breast Exam: benign, surgical scars 2. Mammogram:03/30/2020: Benign breast density category B  Iron deficiency anemia   IV iron therapy January 2019 Lab review: 05/22/2018: Iron saturation 20%, ferritin 168, hemoglobin 13 No further evidence of iron deficiency.    No  orders of the defined types were placed in this encounter.  The patient has a good understanding of the overall plan. she agrees with it. she will call with any problems that may develop before the next visit here.  Total time spent: 20 mins including face to face time and time spent for planning, charting and coordination of care  [name], MD 05/30/2020  I, Cloyde Reams Dorshimer, am acting as scribe for Dr. [name].  I have reviewed the above documentation for accuracy and completeness, and I agree with the above.

## 2020-05-30 ENCOUNTER — Other Ambulatory Visit: Payer: Self-pay

## 2020-05-30 ENCOUNTER — Inpatient Hospital Stay: Payer: Medicare Other | Attending: Hematology and Oncology | Admitting: Hematology and Oncology

## 2020-05-30 DIAGNOSIS — Z79899 Other long term (current) drug therapy: Secondary | ICD-10-CM | POA: Diagnosis not present

## 2020-05-30 DIAGNOSIS — C50412 Malignant neoplasm of upper-outer quadrant of left female breast: Secondary | ICD-10-CM | POA: Insufficient documentation

## 2020-05-30 DIAGNOSIS — N390 Urinary tract infection, site not specified: Secondary | ICD-10-CM | POA: Diagnosis not present

## 2020-05-30 DIAGNOSIS — Z923 Personal history of irradiation: Secondary | ICD-10-CM | POA: Diagnosis not present

## 2020-05-30 DIAGNOSIS — R42 Dizziness and giddiness: Secondary | ICD-10-CM | POA: Insufficient documentation

## 2020-05-30 DIAGNOSIS — Z79811 Long term (current) use of aromatase inhibitors: Secondary | ICD-10-CM | POA: Diagnosis not present

## 2020-05-30 DIAGNOSIS — D509 Iron deficiency anemia, unspecified: Secondary | ICD-10-CM | POA: Diagnosis not present

## 2020-05-30 DIAGNOSIS — Z170 Estrogen receptor positive status [ER+]: Secondary | ICD-10-CM | POA: Insufficient documentation

## 2020-05-30 NOTE — Assessment & Plan Note (Signed)
Left breast biopsy02/19/2018: 12:30: IDC with DCIS involving underlying papillary lesion, grade 1, ER 100%, PR 90%, Ki-67 10%, HER-2 negative ratio 1.29; irregular 1.9 cm mass axilla negative, T1 CN 0 stage IA clinical stage Left lumpectomy 12/25/2016: IDC grade 1, 1.5 cm, low-grade DCIS, resection margins negative, 0/2 lymph nodes negative, ER 100%, PR 100%, HER-2 negative ratio 1.29, Ki-67 10% T1 cN0 stage IA Oncotype DX score 2: 4% risk of recurrence  Adjuvant radiation therapy 01/30/2017-02/21/2017  Treatment plan: Anastrozole 1 mg by mouth daily 5 yearsstarted 02/21/17  Anastrozole Toxicities:Complains of diffuse musculoskeletal aches and pains in the shoulders hips and knees. She attributes this to arthritis.  Surveillance: 1. Breast Exam: benign, surgical scars 2. Mammogram:03/30/2020: Benign breast density category B

## 2020-05-30 NOTE — Assessment & Plan Note (Signed)
Iron deficiency anemia IV iron therapy January 2019 Lab review: 05/22/2018: Iron saturation 20%, ferritin 168, hemoglobin 13 No further evidence of iron deficiency.

## 2020-06-08 DIAGNOSIS — I10 Essential (primary) hypertension: Secondary | ICD-10-CM | POA: Diagnosis not present

## 2020-06-08 NOTE — Progress Notes (Signed)
Cardiology Office Note:    Date:  06/10/2020   ID:  Susan Barnett, DOB Jul 26, 1946, MRN [id number]  PCP:  Susan Frees, MD  Cardiologist:  Sinclair Grooms, MD   Referring MD: Susan Frees, MD   Chief Complaint  Patient presents with  . Congestive Heart Failure  . Coronary Artery Disease    History of Present Illness:    Susan Barnett is a 74 y.o. female with a hx of CAD, recent CABG for repeat in-stent restenosis of right coronary, hypertension, diabetes mellitus II, hyperlipidemia, and obstructive sleep apnea.  She is having some puffiness in her feet.  She has dyspnea on exertion.  Heart rate increases with walking.  Beta-blocker therapy increased.  Some improvement has been noted.  She is concerned about being put back on diuretics because of hyponatremia.  She reveals to me that she no longer wears her CPAP.  She has never liked wearing the CPAP.  This has been the case for least 6 months.  Past Medical History:  Diagnosis Date  . Anemia    mild  . Anginal pain ([place])    with exertion, last time prior to cardiac caht 06/02/14  . Anxiety    situational  . Arthritis   . Breast cancer ([place]) 2018   Left Breast Cancer  . CAD (coronary artery disease)    Dr. Linard Millers follows, no problems now  . Cancer [facility])    Breast cancer  . Complication of anesthesia   . Diabetes mellitus, type 2 ([place])    TYPE 2  . Dysrhythmia    occ skips a beat, rate was fast before beta blocker.  . Gastric polyp   . Genetic testing 04/12/2017   Ms. Glasco underwent genetic counseling and testing for hereditary cancer syndromes on 02/19/2017. Her results were negative for mutations in all 46 genes analyzed by [facility]'s 46-gene Common Hereditary Cancers Panel. Genes analyzed include: APC, ATM, AXIN2, BARD1, BMPR1A, BRCA1, BRCA2, BRIP1, CDH1, CDKN2A, CHEK2, CTNNA1, DICER1, EPCAM, GREM1, HOXB13, KIT, MEN1, MLH1, MSH2, MSH3, MSH6, MUTYH, NBN  . GERD (gastroesophageal reflux disease)   .  Headache    hx of  . Heartburn   . Hiatal hernia    mild head tremors  . History of radiation therapy 01/30/17- 02/21/17   Left Breast 42.56 Gy in 16 fractions  . Hypertension   . Hypothyroidism   . Occasional tremors    "of head", slight hands  . Osteopenia   . Personal history of radiation therapy 2018   Left Breast Cancer  . Pneumonia   . PONV (postoperative nausea and vomiting) 02/24/14  . Shortness of breath    with exertion  . Sleep apnea    cpap used nightly x 2 yrs now.    Past Surgical History:  Procedure Laterality Date  . BREAST BIOPSY Left 1987  . BREAST BIOPSY Left 2019  . BREAST LUMPECTOMY Left 01/25/2017  . BREAST LUMPECTOMY WITH RADIOACTIVE SEED AND SENTINEL LYMPH NODE BIOPSY Left 12/25/2016   Procedure: BREAST LUMPECTOMY WITH RADIOACTIVE SEED AND SENTINEL LYMPH NODE BIOPSY;  Surgeon: [name], MD;  Location: [facility];  Service: General;  Laterality: Left;  . CARDIAC CATHETERIZATION  06/02/2014   LAD 30%, D1 80%, CFX 755, RCA > 95% ISR, rx w/ PTCA, heavy calcification, EF 65%  . CORONARY ARTERY BYPASS GRAFT N/A 06/14/2014   Procedure: CORONARY ARTERY BYPASS GRAFTING (CABG);  Surgeon: Gaye Pollack, MD;  Location: [facility] OR;  Service: Open Heart  Surgery;  Laterality: N/A;  Times 2 using endoscopically harvested right saphenous vein.  . coronary stents     x3 stents placed-prior ablation  . DILATION AND CURETTAGE OF UTERUS    . ELBOW FRACTURE SURGERY Left   . INCONTINENCE SURGERY     sling  . KNEE ARTHROSCOPY Right 02/24/2014   Procedure: RIGHT KNEE ARTHROSCOPY WITH DEBRIDEMENT ;  Surgeon: Gearlean Alf, MD;  Location: [facility] ORS;  Service: Orthopedics;  Laterality: Right;  . LEFT HEART CATHETERIZATION WITH CORONARY ANGIOGRAM N/A 06/02/2014   Procedure: LEFT HEART CATHETERIZATION WITH CORONARY ANGIOGRAM;  Surgeon: Sinclair Grooms, MD;  Location: [facility] CATH LAB;  Service: Cardiovascular;  Laterality: N/A;  . TEE WITHOUT CARDIOVERSION N/A 06/14/2014   Procedure:  TRANSESOPHAGEAL ECHOCARDIOGRAM (TEE);  Surgeon: Gaye Pollack, MD;  Location: [place];  Service: Open Heart Surgery;  Laterality: N/A;    Current Medications: Current Meds  Medication Sig  . amLODipine (NORVASC) 5 MG tablet Take 5 mg by mouth daily.  Marland Kitchen anastrozole (ARIMIDEX) 1 MG tablet Take 1 tablet by mouth once daily  . atorvastatin (LIPITOR) 10 MG tablet Take 10 mg by mouth every evening.   Marland Kitchen CALCIUM PO Take 1 tablet by mouth in the morning and at bedtime.  . cetirizine (ZYRTEC) 10 MG tablet Take 10 mg by mouth daily.   . cloNIDine (CATAPRES) 0.1 MG tablet Take 1 tablet by mouth twice daily  . clopidogrel (PLAVIX) 75 MG tablet Take 75 mg by mouth daily.  . Coenzyme Q10 300 MG CAPS Take 300 mg by mouth 2 (two) times daily.  . Cranberry 1000 MG CAPS Take by mouth.  . diphenhydramine-acetaminophen (TYLENOL PM) 25-500 MG TABS tablet Take 1 tablet by mouth at bedtime as needed (For pain).   Marland Kitchen LANTUS SOLOSTAR 100 UNIT/ML Solostar Pen Inject 40 Units into the skin at bedtime.  Marland Kitchen levothyroxine (SYNTHROID, LEVOTHROID) 75 MCG tablet Take 75 mcg by mouth daily before breakfast.   . Loratadine (CLARITIN) 10 MG CAPS Take by mouth.  . losartan (COZAAR) 100 MG tablet Take 1 tablet (100 mg total) by mouth daily.  . metoprolol tartrate (LOPRESSOR) 50 MG tablet Take 2 tablets (100 mg total) by mouth 2 (two) times daily.  . Multiple Vitamin (MULTIVITAMIN ADULT PO) Take by mouth.  Glory Rosebush VERIO test strip 1 each 2 (two) times daily.  . pantoprazole (PROTONIX) 40 MG tablet Take 1 tablet (40 mg total) by mouth daily before breakfast.  . Polyethyl Glycol-Propyl Glycol (SYSTANE OP) Place 1 drop into both eyes as needed.   . sitaGLIPtin-metformin (JANUMET) 50-1000 MG tablet Take 1 tablet by mouth 2 (two) times daily with a meal.     Allergies:   Asa [aspirin], Compazine [prochlorperazine edisylate], Nsaids, Shellfish allergy, Sulfa antibiotics, Zantac [ranitidine hcl], and Lisinopril   Social History    Socioeconomic History  . Marital status: Married    Spouse name: Not on file  . Number of children: Not on file  . Years of education: Not on file  . Highest education level: Not on file  Occupational History  . Not on file  Tobacco Use  . Smoking status: Never Smoker  . Smokeless tobacco: Never Used  Vaping Use  . Vaping Use: Never used  Substance and Sexual Activity  . Alcohol use: No  . Drug use: No  . Sexual activity: Not Currently  Other Topics Concern  . Not on file  Social History Narrative  . Not on file   Social  Determinants of Health   Financial Resource Strain:   . Difficulty of Paying Living Expenses: Not on file  Food Insecurity:   . Worried About Charity fundraiser in the Last Year: Not on file  . Ran Out of Food in the Last Year: Not on file  Transportation Needs:   . Lack of Transportation (Medical): Not on file  . Lack of Transportation (Non-Medical): Not on file  Physical Activity:   . Days of Exercise per Week: Not on file  . Minutes of Exercise per Session: Not on file  Stress:   . Feeling of Stress : Not on file  Social Connections:   . Frequency of Communication with Friends and Family: Not on file  . Frequency of Social Gatherings with Friends and Family: Not on file  . Attends Religious Services: Not on file  . Active Member of Clubs or Organizations: Not on file  . Attends Archivist Meetings: Not on file  . Marital Status: Not on file     Family History: The patient's family history includes Breast cancer (age of onset: 66) in her cousin; Diabetes in her mother; Healthy in her brother; Heart attack in her mother; Hodgkin's lymphoma in her maternal uncle; Hypertension in her mother; Lung cancer in her brother; Ulcerative colitis in her daughter.  ROS:   Please see the history of present illness.    She has noted increased palpitations and increased heart rate.  Breathlessness.  She did develop hyponatremia on the combination  of spironolactone and HCTZ.  Hyponatremia presented with her becoming somewhat disoriented and having a fall at home.  All diuretic therapy has been discontinued.  She is not sleeping well.  All other systems reviewed and are negative.  EKGs/Labs/Other Studies Reviewed:    The following studies were reviewed today: No new data  EKG:  EKG not repeated  Recent Labs: 12/18/2019: TSH 2.233 12/21/2019: ALT 18 05/10/2020: BUN 19; Creatinine, Ser 0.85; Hemoglobin 12.1; Platelets 341; Potassium 4.5; Sodium 140  Recent Lipid Panel    Component Value Date/Time   CHOL  08/13/2007 0615    116        ATP III CLASSIFICATION:  <200     mg/dL   Desirable  200-239  mg/dL   Borderline High  >=240    mg/dL   High   TRIG 69 08/13/2007 0615   HDL 41 08/13/2007 0615   CHOLHDL 2.8 08/13/2007 0615   VLDL 14 08/13/2007 0615   LDLCALC  08/13/2007 0615    61        Total Cholesterol/HDL:CHD Risk Coronary Heart Disease Risk Table                     Men   Women  1/2 Average Risk   3.4   3.3    Physical Exam:    VS:  BP 136/62   Pulse 82   Ht 5' 2" (1.575 m)   Wt 155 lb 6.4 oz (70.5 kg)   SpO2 97%   BMI 28.42 kg/m     Wt Readings from Last 3 Encounters:  06/10/20 155 lb 6.4 oz (70.5 kg)  05/30/20 157 lb 12.8 oz (71.6 kg)  05/18/20 158 lb (71.7 kg)     GEN: Elderly. No acute distress HEENT: Normal NECK: No JVD. LYMPHATICS: No lymphadenopathy CARDIAC:  RRR without murmur, gallop, or edema. VASCULAR:  Normal Pulses. No bruits. RESPIRATORY:  Clear to auscultation without rales, wheezing or rhonchi  ABDOMEN: Soft, non-tender, non-distended, No pulsatile mass, MUSCULOSKELETAL: No deformity  SKIN: Warm and dry NEUROLOGIC:  Alert and oriented x 3 PSYCHIATRIC:  Normal affect   ASSESSMENT:    1. Coronary artery disease involving coronary bypass graft of native heart with angina pectoris ([place])   2. Essential hypertension   3. Mixed hyperlipidemia   4. Type 2 diabetes mellitus with  complications ([place])   5. OSA (obstructive sleep apnea)   6. Educated about COVID-19 virus infection   7. Chronic diastolic heart failure (H[facility])    PLAN:    In order of problems listed above:  1. Secondary prevention is understood. 2. Blood pressure is actually well controlled.  Low-salt diet is being followed.   3. Continue Lipitor 10 mg/day 4. Continue therapy as prescribed by primary which includes Lantus, and low carbohydrate diet. 5. I have strongly encouraged that she resume CPAP.  This has a lot to do with her decreased energy and restlessness. 6. Vaccinated and practicing social medication. 7. Start HCTZ 12.5 mg, Monday Wednesday and Friday.  Bmet in 10 days.   Remove volume with low-dose diuretic therapy but check to make sure she does not redevelop hyponatremia.  Basic metabolic panel in 10 days.   Medication Adjustments/Labs and Tests Ordered: Current medicines are reviewed at length with the patient today.  Concerns regarding medicines are outlined above.  Orders Placed This Encounter  Procedures  . Basic metabolic panel   Meds ordered this encounter  Medications  . hydrochlorothiazide (MICROZIDE) 12.5 MG capsule    Sig: Take 1 capsule (12.5 mg total) by mouth every Monday, Wednesday, and Friday.    Dispense:  45 capsule    Refill:  3    Patient Instructions  Medication Instructions:  1) START Hydrochlorothiazide 12.63m once daily on Monday, Wednesday and Friday  *If you need a refill on your cardiac medications before your next appointment, please call your pharmacy*   Lab Work: BMET in 10 days  If you have labs (blood work) drawn today and your tests are completely normal, you will receive your results only by: .Marland KitchenMyChart Message (if you have MyChart) OR . A paper copy in the mail If you have any lab test that is abnormal or we need to change your treatment, we will call you to review the results.   Testing/Procedures: None   Follow-Up: [facility] and your health needs are our priority.  As part of our continuing mission to provide you with exceptional heart care, we have created designated Provider Care Teams.  These Care Teams include your primary Cardiologist (physician) and Advanced Practice Providers (APPs -  Physician Assistants and Nurse Practitioners) who all work together to provide you with the care you need, when you need it.  We recommend signing up for the patient portal called "MyChart".  Sign up information is provided on this After Visit Summary.  MyChart is used to connect with patients for Virtual Visits (Telemedicine).  Patients are able to view lab/test results, encounter notes, upcoming appointments, etc.  Non-urgent messages can be sent to your provider as well.   To learn more about what you can do with MyChart, go to [email or web address]    Your next appointment:   2 month(s)  The format for your next appointment:   In Person  Provider:   You may see HSinclair Grooms MD or one of the following Advanced Practice Providers on your designated Care Team:    LTruitt Merle  NP  Cecilie Kicks, NP  Kathyrn Drown, NP    Other Instructions      Signed, Sinclair Grooms, MD  06/10/2020 3:49 PM    [facility]

## 2020-06-09 DIAGNOSIS — I10 Essential (primary) hypertension: Secondary | ICD-10-CM | POA: Diagnosis not present

## 2020-06-09 DIAGNOSIS — E039 Hypothyroidism, unspecified: Secondary | ICD-10-CM | POA: Diagnosis not present

## 2020-06-09 DIAGNOSIS — I25119 Atherosclerotic heart disease of native coronary artery with unspecified angina pectoris: Secondary | ICD-10-CM | POA: Diagnosis not present

## 2020-06-09 DIAGNOSIS — E785 Hyperlipidemia, unspecified: Secondary | ICD-10-CM | POA: Diagnosis not present

## 2020-06-09 DIAGNOSIS — E1165 Type 2 diabetes mellitus with hyperglycemia: Secondary | ICD-10-CM | POA: Diagnosis not present

## 2020-06-10 ENCOUNTER — Ambulatory Visit: Payer: Medicare Other | Admitting: Interventional Cardiology

## 2020-06-10 ENCOUNTER — Other Ambulatory Visit: Payer: Self-pay

## 2020-06-10 ENCOUNTER — Encounter: Payer: Self-pay | Admitting: Interventional Cardiology

## 2020-06-10 VITALS — BP 136/62 | HR 82 | Ht 62.0 in | Wt 155.4 lb

## 2020-06-10 DIAGNOSIS — Z7189 Other specified counseling: Secondary | ICD-10-CM

## 2020-06-10 DIAGNOSIS — I10 Essential (primary) hypertension: Secondary | ICD-10-CM

## 2020-06-10 DIAGNOSIS — I5032 Chronic diastolic (congestive) heart failure: Secondary | ICD-10-CM

## 2020-06-10 DIAGNOSIS — E118 Type 2 diabetes mellitus with unspecified complications: Secondary | ICD-10-CM

## 2020-06-10 DIAGNOSIS — I25709 Atherosclerosis of coronary artery bypass graft(s), unspecified, with unspecified angina pectoris: Secondary | ICD-10-CM | POA: Diagnosis not present

## 2020-06-10 DIAGNOSIS — G4733 Obstructive sleep apnea (adult) (pediatric): Secondary | ICD-10-CM

## 2020-06-10 DIAGNOSIS — E782 Mixed hyperlipidemia: Secondary | ICD-10-CM

## 2020-06-10 MED ORDER — HYDROCHLOROTHIAZIDE 12.5 MG PO CAPS: 12.5000 mg | ORAL_CAPSULE | ORAL | 3 refills | Status: DC

## 2020-06-10 NOTE — Patient Instructions (Signed)
Medication Instructions:  1) START Hydrochlorothiazide 12.5mg  once daily on Monday, Wednesday and Friday  *If you need a refill on your cardiac medications before your next appointment, please call your pharmacy*   Lab Work: BMET in 10 days  If you have labs (blood work) drawn today and your tests are completely normal, you will receive your results only by: Marland Kitchen MyChart Message (if you have MyChart) OR . A paper copy in the mail If you have any lab test that is abnormal or we need to change your treatment, we will call you to review the results.   Testing/Procedures: None   Follow-Up: At [facility], you and your health needs are our priority.  As part of our continuing mission to provide you with exceptional heart care, we have created designated Provider Care Teams.  These Care Teams include your primary Cardiologist (physician) and Advanced Practice Providers (APPs -  Physician Assistants and Nurse Practitioners) who all work together to provide you with the care you need, when you need it.  We recommend signing up for the patient portal called "MyChart".  Sign up information is provided on this After Visit Summary.  MyChart is used to connect with patients for Virtual Visits (Telemedicine).  Patients are able to view lab/test results, encounter notes, upcoming appointments, etc.  Non-urgent messages can be sent to your provider as well.   To learn more about what you can do with MyChart, go to [email or web address].    Your next appointment:   2 month(s)  The format for your next appointment:   In Person  Provider:   You may see Sinclair Grooms, MD or one of the following Advanced Practice Providers on your designated Care Team:    Truitt Merle, NP  Cecilie Kicks, NP  Kathyrn Drown, NP    Other Instructions

## 2020-06-20 ENCOUNTER — Inpatient Hospital Stay: Payer: Medicare Other | Attending: Hematology and Oncology | Admitting: Hematology and Oncology

## 2020-06-20 ENCOUNTER — Other Ambulatory Visit: Payer: Self-pay

## 2020-06-20 ENCOUNTER — Other Ambulatory Visit: Payer: Medicare Other | Admitting: *Deleted

## 2020-06-20 DIAGNOSIS — I10 Essential (primary) hypertension: Secondary | ICD-10-CM | POA: Diagnosis not present

## 2020-06-20 DIAGNOSIS — I25709 Atherosclerosis of coronary artery bypass graft(s), unspecified, with unspecified angina pectoris: Secondary | ICD-10-CM | POA: Diagnosis not present

## 2020-06-20 DIAGNOSIS — C50412 Malignant neoplasm of upper-outer quadrant of left female breast: Secondary | ICD-10-CM

## 2020-06-20 DIAGNOSIS — Z170 Estrogen receptor positive status [ER+]: Secondary | ICD-10-CM | POA: Diagnosis not present

## 2020-06-20 MED ORDER — LETROZOLE 2.5 MG PO TABS: 2.5000 mg | ORAL_TABLET | Freq: Every day | ORAL | 3 refills | Status: DC

## 2020-06-20 NOTE — Assessment & Plan Note (Signed)
Left breast biopsy02/19/2018: 12:30: IDC with DCIS involving underlying papillary lesion, grade 1, ER 100%, PR 90%, Ki-67 10%, HER-2 negative ratio 1.29; irregular 1.9 cm mass axilla negative, T1 CN 0 stage IA clinical stage Left lumpectomy 12/25/2016: IDC grade 1, 1.5 cm, low-grade DCIS, resection margins negative, 0/2 lymph nodes negative, ER 100%, PR 100%, HER-2 negative ratio 1.29, Ki-67 10% T1 cN0 stage IA Oncotype DX score 2: 4% risk of recurrence  Adjuvant radiation therapy 01/30/2017-02/21/2017  Treatment plan: Anastrozole 1 mg by mouth daily 5 yearsstarted 02/21/17 stopped 05/30/2020 for arthralgias  Anastrozole Toxicities:worsening arthralgias myalgias. If her symptoms are better than we will consider switching her to letrozole. They are not better than she can resume and continue with anastrozole therapy.  We can then attribute the muscle aches and pains to her chronic arthritis.  Surveillance: 1. Breast Exam: benign, surgical scars 2. Mammogram:03/30/2020: Benign breast density category B

## 2020-06-20 NOTE — Progress Notes (Signed)
HEMATOLOGY-ONCOLOGY MYCHART VIDEO VISIT PROGRESS NOTE  I connected with Susan Barnett on 06/20/2020 at 12:00 PM EST by MyChart video conference and verified that I am speaking with the correct person using two identifiers.  I discussed the limitations, risks, security and privacy concerns of performing an evaluation and management service by MyChart and the availability of in person appointments.  I also discussed with the patient that there may be a patient responsible charge related to this service. The patient expressed understanding and agreed to proceed.  Patient's Location: Home Physician Location: Clinic  CHIEF COMPLIANT: Follow-up of breast cancer    INTERVAL HISTORY: Susan Barnett is a 74 y.o. female with above-mentioned history of left breast cancer treated with lumpectomy,radiation, and whois currently on anastrozole therapy. She also has a history of iron deficiency anemia and has previously received IV iron.She presents over MyChart todayfor annual follow-up. She stopped anastrozole therapy and we are checking in with her to see if her joint aches and pains have improved.  She tells me that some symptoms are improved but other still persistent.  Especially when the weather is cold her symptoms are worse.  Oncology History  Malignant neoplasm of upper-outer quadrant of left breast in female, estrogen receptor positive ([place])  10/01/2016 Initial Diagnosis   Left breast biopsy: 12:30: IDC with DCIS involving underlying papillary lesion, grade 1, ER 100%, PR 90%, Ki-67 10%, HER-2 negative ratio 1.29; irregular 1.9 cm mass axilla negative, T1 CN 0 stage IA clinical stage   12/25/2016 Surgery   Left lumpectomy: IDC grade 1, 1.5 cm, low-grade DCIS, resection margins negative, 0/2 lymph nodes negative, ER 100%, PR 100%, HER-2 negative ratio 1.29, Ki-67 10% T1 cN0 stage IA   12/25/2016 Oncotype testing   Oncotype DX score 2: Risk of recurrence 4%   01/30/2017 - 02/21/2017 Radiation  Therapy   Adjuvant radiation therapy   02/2017 -  Anti-estrogen oral therapy   Anastrozole daily   03/25/2017 Genetic Testing   Genetic counseling and testing for hereditary cancer syndromes performed on 02/19/2017. Results are negative for pathogenic mutations in 46 genes analyzed by [facility]'s Common Hereditary Cancers Panel. Results are dated 03/25/2017. Genes tested: APC, ATM, AXIN2, BARD1, BMPR1A, BRCA1, BRCA2, BRIP1, CDH1, CDKN2A, CHEK2, CTNNA1, DICER1, EPCAM, GREM1, HOXB13, KIT, MEN1, MLH1, MSH2, MSH3, MSH6, MUTYH, NBN, NF1, NTHL1, PALB2, PDGFRA, PMS2, POLD1, POLE, PTEN, RAD50, RAD51C, RAD51D, SDHA, SDHB, SDHC, SDHD, SMAD4, SMARCA4, STK11, TP53, TSC1, TSC2, and VHL.       Observations/Objective:  There were no vitals filed for this visit. There is no height or weight on file to calculate BMI.  I have reviewed the data as listed CMP Latest Ref Rng & Units 05/10/2020 12/21/2019 12/20/2019  Glucose 65 - 99 mg/dL 180(H) 164(H) 126(H)  BUN 8 - 27 mg/dL _0 Creatinine 0.57 - 1.00 mg/dL 0.85 0.59 0.51  Sodium 134 - 144 mmol/L 140 131(L) 126(L)  Potassium 3.5 - 5.2 mmol/L 4.5 4.0 3.5  Chloride 96 - 106 mmol/L 104 98 90(L)  CO2 20 - 29 mmol/L _1 Calcium 8.7 - 10.3 mg/dL 9.7 8.9 9.1  Total Protein 6.5 - 8.1 g/dL - 6.9 7.0  Total Bilirubin 0.3 - 1.2 mg/dL - 0.6 0.6  Alkaline Phos 38 - 126 U/L - 69 59  AST 15 - 41 U/L - 18 18  ALT 0 - 44 U/L - 18 18    Lab Results  Component Value Date   WBC 11.3 (H) 05/10/2020  HGB 12.1 05/10/2020   HCT 37.2 05/10/2020   MCV 90 05/10/2020   PLT 341 05/10/2020   NEUTROABS 6.5 05/28/2019      Assessment Plan:  Malignant neoplasm of upper-outer quadrant of left breast in female, estrogen receptor positive ([place]) Left breast biopsy02/19/2018: 12:30: IDC with DCIS involving underlying papillary lesion, grade 1, ER 100%, PR 90%, Ki-67 10%, HER-2 negative ratio 1.29; irregular 1.9 cm mass axilla negative, T1 CN 0 stage IA clinical  stage Left lumpectomy 12/25/2016: IDC grade 1, 1.5 cm, low-grade DCIS, resection margins negative, 0/2 lymph nodes negative, ER 100%, PR 100%, HER-2 negative ratio 1.29, Ki-67 10% T1 cN0 stage IA Oncotype DX score 2: 4% risk of recurrence  Adjuvant radiation therapy 01/30/2017-02/21/2017  Treatment plan: Anastrozole 1 mg by mouth daily 5 yearsstarted 02/21/17 stopped 05/30/2020 for arthralgias Some symptoms are better but others are not much different.  We still decided to switch her from anastrozole to letrozole.   Surveillance: 1. Breast Exam: benign, surgical scars 2. Mammogram:03/30/2020: Benign breast density category B  MyChart virtual visit in 3 months to assess tolerability to letrozole.  I discussed the assessment and treatment plan with the patient. The patient was provided an opportunity to ask questions and all were answered. The patient agreed with the plan and demonstrated an understanding of the instructions. The patient was advised to call back or seek an in-person evaluation if the symptoms worsen or if the condition fails to improve as anticipated.   I provided 20 minutes of face-to-face MyChart video visit time during this encounter.    Rulon Eisenmenger, MD 06/20/2020   I, Molly Dorshimer, am acting as scribe for [name], MD.  I have reviewed the above documentation for accuracy and completeness, and I agree with the above.

## 2020-06-21 LAB — BASIC METABOLIC PANEL
BUN/Creatinine Ratio: 26 (ref 12–28)
BUN: 12 mg/dL (ref 8–27)
CO2: 26 mmol/L (ref 20–29)
Calcium: 9.7 mg/dL (ref 8.7–10.3)
Chloride: 101 mmol/L (ref 96–106)
Creatinine, Ser: 0.47 mg/dL — ABNORMAL LOW (ref 0.57–1.00)
GFR calc Af Amer: 112 mL/min/{1.73_m2} (ref >59)
GFR calc non Af Amer: 97 mL/min/{1.73_m2} (ref >59)
Glucose: 176 mg/dL — ABNORMAL HIGH (ref 65–99)
Potassium: 4.3 mmol/L (ref 3.5–5.2)
Sodium: 139 mmol/L (ref 134–144)

## 2020-06-24 DIAGNOSIS — E039 Hypothyroidism, unspecified: Secondary | ICD-10-CM | POA: Diagnosis not present

## 2020-06-24 DIAGNOSIS — E785 Hyperlipidemia, unspecified: Secondary | ICD-10-CM | POA: Diagnosis not present

## 2020-06-24 DIAGNOSIS — I25119 Atherosclerotic heart disease of native coronary artery with unspecified angina pectoris: Secondary | ICD-10-CM | POA: Diagnosis not present

## 2020-06-24 DIAGNOSIS — I10 Essential (primary) hypertension: Secondary | ICD-10-CM | POA: Diagnosis not present

## 2020-07-01 DIAGNOSIS — Z87440 Personal history of urinary (tract) infections: Secondary | ICD-10-CM | POA: Diagnosis not present

## 2020-07-18 DIAGNOSIS — I25119 Atherosclerotic heart disease of native coronary artery with unspecified angina pectoris: Secondary | ICD-10-CM | POA: Diagnosis not present

## 2020-07-18 DIAGNOSIS — E785 Hyperlipidemia, unspecified: Secondary | ICD-10-CM | POA: Diagnosis not present

## 2020-07-18 DIAGNOSIS — E039 Hypothyroidism, unspecified: Secondary | ICD-10-CM | POA: Diagnosis not present

## 2020-07-18 DIAGNOSIS — E1165 Type 2 diabetes mellitus with hyperglycemia: Secondary | ICD-10-CM | POA: Diagnosis not present

## 2020-07-20 DIAGNOSIS — I2510 Atherosclerotic heart disease of native coronary artery without angina pectoris: Secondary | ICD-10-CM | POA: Diagnosis not present

## 2020-07-20 DIAGNOSIS — E1142 Type 2 diabetes mellitus with diabetic polyneuropathy: Secondary | ICD-10-CM | POA: Diagnosis not present

## 2020-07-20 DIAGNOSIS — E039 Hypothyroidism, unspecified: Secondary | ICD-10-CM | POA: Diagnosis not present

## 2020-07-20 DIAGNOSIS — C50919 Malignant neoplasm of unspecified site of unspecified female breast: Secondary | ICD-10-CM | POA: Diagnosis not present

## 2020-08-02 NOTE — Progress Notes (Signed)
CARDIOLOGY OFFICE NOTE  Date:  08/16/2020    Susan Barnett Date of Birth: September 10, 1945 Medical Record #[id number]  PCP:  Susan Frees, MD  Cardiologist:  Jennings Books  Chief Complaint  Patient presents with  . Follow-up    Seen for Dr. Tamala Julian    History of Present Illness: Susan Barnett is a 74 y.o. female who presents today for a follow up visit. Seen for Dr. Tamala Julian.   She has a history of known CAD, subsequent CABG x 2 in 2015 per Dr. Cyndia Bent for repeat in-stent restenosis of right coronary, hypertension, diabetes mellitus II, hyperlipidemia, and obstructive sleep apnea.   She was last seen in October by Dr. Tamala Julian - some DOE and swelling noted. Had had increased HR with walking - beta blocker increased with some improvement. Had stopped her CPAP.  Has had prior tendency towards hyponatremia with multiple diuretics. Low dose diuretic was restarted at her last visit.    Comes in today. Here alone. Doing ok.  Happy with how she is doing. No chest pain. Her breathing is stable - stable DOE. Swelling has improved. Her weight is down. She is not using the CPAP. She can sit down and "go right off to sleep". She had trouble with the mask blowing air up into her eyes - she thought this was causing styes. Tolerating her medicines. She has had recent labs with Dr. Forde Dandy. Her A1C is improving.    Past Medical History:  Diagnosis Date  . Anemia    mild  . Anginal pain ([place])    with exertion, last time prior to cardiac caht 06/02/14  . Anxiety    situational  . Arthritis   . Breast cancer ([place]) 2018   Left Breast Cancer  . CAD (coronary artery disease)    Dr. Linard Millers follows, no problems now  . Cancer [facility])    Breast cancer  . Complication of anesthesia   . Diabetes mellitus, type 2 ([place])    TYPE 2  . Dysrhythmia    occ skips a beat, rate was fast before beta blocker.  . Gastric polyp   . Genetic testing 04/12/2017   Ms. Fittro underwent genetic counseling and testing  for hereditary cancer syndromes on 02/19/2017. Her results were negative for mutations in all 46 genes analyzed by [facility]'s 46-gene Common Hereditary Cancers Panel. Genes analyzed include: APC, ATM, AXIN2, BARD1, BMPR1A, BRCA1, BRCA2, BRIP1, CDH1, CDKN2A, CHEK2, CTNNA1, DICER1, EPCAM, GREM1, HOXB13, KIT, MEN1, MLH1, MSH2, MSH3, MSH6, MUTYH, NBN  . GERD (gastroesophageal reflux disease)   . Headache    hx of  . Heartburn   . Hiatal hernia    mild head tremors  . History of radiation therapy 01/30/17- 02/21/17   Left Breast 42.56 Gy in 16 fractions  . Hypertension   . Hypothyroidism   . Occasional tremors    "of head", slight hands  . Osteopenia   . Personal history of radiation therapy 2018   Left Breast Cancer  . Pneumonia   . PONV (postoperative nausea and vomiting) 02/24/14  . Shortness of breath    with exertion  . Sleep apnea    cpap used nightly x 2 yrs now.    Past Surgical History:  Procedure Laterality Date  . BREAST BIOPSY Left 1987  . BREAST BIOPSY Left 2019  . BREAST LUMPECTOMY Left 01/25/2017  . BREAST LUMPECTOMY WITH RADIOACTIVE SEED AND SENTINEL LYMPH NODE BIOPSY Left 12/25/2016   Procedure: BREAST LUMPECTOMY  WITH RADIOACTIVE SEED AND SENTINEL LYMPH NODE BIOPSY;  Surgeon: [name], MD;  Location: [facility];  Service: General;  Laterality: Left;  . CARDIAC CATHETERIZATION  06/02/2014   LAD 30%, D1 80%, CFX 755, RCA > 95% ISR, rx w/ PTCA, heavy calcification, EF 65%  . CORONARY ARTERY BYPASS GRAFT N/A 06/14/2014   Procedure: CORONARY ARTERY BYPASS GRAFTING (CABG);  Surgeon: Gaye Pollack, MD;  Location: [facility];  Service: Open Heart Surgery;  Laterality: N/A;  Times 2 using endoscopically harvested right saphenous vein.  . coronary stents     x3 stents placed-prior ablation  . DILATION AND CURETTAGE OF UTERUS    . ELBOW FRACTURE SURGERY Left   . INCONTINENCE SURGERY     sling  . KNEE ARTHROSCOPY Right 02/24/2014   Procedure: RIGHT KNEE ARTHROSCOPY WITH  DEBRIDEMENT ;  Surgeon: Gearlean Alf, MD;  Location: [facility];  Service: Orthopedics;  Laterality: Right;  . LEFT HEART CATHETERIZATION WITH CORONARY ANGIOGRAM N/A 06/02/2014   Procedure: LEFT HEART CATHETERIZATION WITH CORONARY ANGIOGRAM;  Surgeon: Sinclair Grooms, MD;  Location: [facility] CATH LAB;  Service: Cardiovascular;  Laterality: N/A;  . TEE WITHOUT CARDIOVERSION N/A 06/14/2014   Procedure: TRANSESOPHAGEAL ECHOCARDIOGRAM (TEE);  Surgeon: Gaye Pollack, MD;  Location: [facility];  Service: Open Heart Surgery;  Laterality: N/A;     Medications: Current Meds  Medication Sig  . amLODipine (NORVASC) 5 MG tablet Take 5 mg by mouth daily.  Marland Kitchen anastrozole (ARIMIDEX) 1 MG tablet Take 1 tablet by mouth once daily  . atorvastatin (LIPITOR) 10 MG tablet Take 10 mg by mouth every evening.   Marland Kitchen CALCIUM PO Take 1 tablet by mouth in the morning and at bedtime.  . cetirizine (ZYRTEC) 10 MG tablet Take 10 mg by mouth daily.   . cloNIDine (CATAPRES) 0.1 MG tablet Take 1 tablet by mouth twice daily  . clopidogrel (PLAVIX) 75 MG tablet Take 75 mg by mouth daily.  . Coenzyme Q10 300 MG CAPS Take 300 mg by mouth 2 (two) times daily.  . diphenhydramine-acetaminophen (TYLENOL PM) 25-500 MG TABS tablet Take 1 tablet by mouth at bedtime as needed (For pain).   . hydrochlorothiazide (MICROZIDE) 12.5 MG capsule Take 1 capsule (12.5 mg total) by mouth every Monday, Wednesday, and Friday.  Marland Kitchen LANTUS SOLOSTAR 100 UNIT/ML Solostar Pen Inject 40 Units into the skin at bedtime.  Marland Kitchen letrozole (FEMARA) 2.5 MG tablet Take 1 tablet (2.5 mg total) by mouth daily.  Marland Kitchen levothyroxine (SYNTHROID, LEVOTHROID) 75 MCG tablet Take 75 mcg by mouth daily before breakfast.   . losartan (COZAAR) 100 MG tablet Take 1 tablet (100 mg total) by mouth daily.  . metoprolol tartrate (LOPRESSOR) 50 MG tablet Take 2 tablets (100 mg total) by mouth 2 (two) times daily.  . miconazole (ANTIFUNGAL) 2 % powder as needed.  . Multiple Vitamin (MULTIVITAMIN  ADULT PO) Take by mouth.  Glory Rosebush VERIO test strip 1 each 2 (two) times daily.  . pantoprazole (PROTONIX) 40 MG tablet Take 1 tablet (40 mg total) by mouth daily before breakfast.  . Polyethyl Glycol-Propyl Glycol (SYSTANE OP) Place 1 drop into both eyes as needed.   . Probiotic Product (ALIGN) 4 MG CAPS daily in the afternoon.  . sitaGLIPtin-metformin (JANUMET) 50-1000 MG tablet Take 1 tablet by mouth 2 (two) times daily with a meal.     Allergies: Allergies  Allergen Reactions  . Asa [Aspirin] Anaphylaxis  . Compazine [Prochlorperazine Edisylate] Swelling and Other (See Comments)  Tongue swelling   . Nsaids Hives and Swelling  . Shellfish Allergy Hives and Swelling  . Sulfa Antibiotics Hives and Swelling  . Zantac [Ranitidine Hcl] Hives and Swelling  . Lisinopril Cough    Social History: The patient  reports that she has never smoked. She has never used smokeless tobacco. She reports that she does not drink alcohol and does not use drugs.   Family History: The patient's family history includes Breast cancer (age of onset: 57) in her cousin; Diabetes in her mother; Healthy in her brother; Heart attack in her mother; Hodgkin's lymphoma in her maternal uncle; Hypertension in her mother; Lung cancer in her brother; Ulcerative colitis in her daughter.   Review of Systems: Please see the history of present illness.   All other systems are reviewed and negative.   Physical Exam: VS:  BP 130/68   Pulse 74   Ht 5' 2"  (1.575 m)   Wt 151 lb (68.5 kg)   SpO2 97%   BMI 27.62 kg/m  .  BMI Body mass index is 27.62 kg/m.  Wt Readings from Last 3 Encounters:  08/16/20 151 lb (68.5 kg)  06/10/20 155 lb 6.4 oz (70.5 kg)  05/30/20 157 lb 12.8 oz (71.6 kg)    General: Pleasant. Alert and in no acute distress. Weight is down.   Cardiac: Regular rate and rhythm. No murmurs, rubs, or gallops. No edema.  Respiratory:  Lungs are clear to auscultation bilaterally with normal work of  breathing.  GI: Soft and nontender.  MS: No deformity or atrophy. Gait and ROM intact.  Skin: Warm and dry. Color is normal.  Neuro:  Strength and sensation are intact and no gross focal deficits noted.  Psych: Alert, appropriate and with normal affect.   LABORATORY DATA:  EKG:  EKG is not ordered today.    Lab Results  Component Value Date   WBC 11.3 (H) 05/10/2020   HGB 12.1 05/10/2020   HCT 37.2 05/10/2020   PLT 341 05/10/2020   GLUCOSE 176 (H) 06/20/2020   CHOL  08/13/2007    116        ATP III CLASSIFICATION:  <200     mg/dL   Desirable  200-239  mg/dL   Borderline High  >=240    mg/dL   High   TRIG 69 08/13/2007   HDL 41 08/13/2007   LDLCALC  08/13/2007    61        Total Cholesterol/HDL:CHD Risk Coronary Heart Disease Risk Table                     Men   Women  1/2 Average Risk   3.4   3.3   ALT 18 12/21/2019   AST 18 12/21/2019   NA 139 06/20/2020   K 4.3 06/20/2020   CL 101 06/20/2020   CREATININE 0.47 (L) 06/20/2020   BUN 12 06/20/2020   CO2 26 06/20/2020   TSH 2.233 12/18/2019   INR 1.59 (H) 06/14/2014   HGBA1C 8.4 (H) 12/18/2019       BNP (last 3 results) No results for input(s): BNP in the last 8760 hours.  ProBNP (last 3 results) No results for input(s): PROBNP in the last 8760 hours.   Other Studies Reviewed Today:  Myoview Study Highlights 08/2018    The left ventricular ejection fraction is hyperdynamic (>65%).  Nuclear stress EF: 67%.  There was no ST segment deviation noted during stress.  The study is normal.  This is a low risk study.  Normal stress nuclear study with no ischemia or infarction; EF 67 with normal wall motion.   Echo Study Conclusions 08/2018  - Left ventricle: The cavity size was normal. Wall thickness was  increased in a pattern of mild LVH. There was mild focal basal  hypertrophy of the septum. Systolic function was normal. The  estimated ejection fraction was in the range of 55% to 60%.  Wall  motion was normal; there were no regional wall motion  abnormalities. Features are consistent with a pseudonormal left  ventricular filling pattern, with concomitant abnormal relaxation  and increased filling pressure (grade 2 diastolic dysfunction).  Doppler parameters are consistent with high ventricular filling  pressure.  - Aortic valve: There was trivial regurgitation.  - Mitral valve: Calcified annulus. There was mild regurgitation.  - Left atrium: The atrium was mildly dilated.  - Pulmonary arteries: Systolic pressure was mildly increased. PA  peak pressure: 32 mm Hg (S).       ASSESSMENT & PLAN:     1. CAD with prior CABG from 2015 - she is doing well. No worrisome symptoms. BP is good. LDL at goal.   2. HTN - BP looks good - no changes made today.   3. HLD - labs per Endocrine and PCP.   4. DM - her A1C is improving - this is good.   5. OSA - not using CPAP despite encouragement - she has tried.   6. History of hyponatremia - follow up lab stable. Her swelling has improved.   7. Palpitations - this has improved with prior increase in beta blocker.   8. Swelling - has improved with just low dose diuretic.   Current medicines are reviewed with the patient today.  The patient does not have concerns regarding medicines other than what has been noted above.  The following changes have been made:  See above.  Labs/ tests ordered today include:   No orders of the defined types were placed in this encounter.    Disposition:   Has recall with Dr. Tamala Julian for March - will push that out to April/May.    Patient is agreeable to this plan and will call if any problems develop in the interim.   SignedTruitt Merle, NP  08/16/2020 11:56 AM  [facility] [address] [place], [place]  [address] Phone: [phone] Fax: [phone]

## 2020-08-16 ENCOUNTER — Other Ambulatory Visit: Payer: Self-pay

## 2020-08-16 ENCOUNTER — Ambulatory Visit: Payer: Medicare Other | Admitting: Nurse Practitioner

## 2020-08-16 ENCOUNTER — Encounter: Payer: Self-pay | Admitting: Nurse Practitioner

## 2020-08-16 VITALS — BP 130/68 | HR 74 | Ht 62.0 in | Wt 151.0 lb

## 2020-08-16 DIAGNOSIS — E782 Mixed hyperlipidemia: Secondary | ICD-10-CM

## 2020-08-16 DIAGNOSIS — I25709 Atherosclerosis of coronary artery bypass graft(s), unspecified, with unspecified angina pectoris: Secondary | ICD-10-CM

## 2020-08-16 DIAGNOSIS — I10 Essential (primary) hypertension: Secondary | ICD-10-CM | POA: Diagnosis not present

## 2020-08-16 DIAGNOSIS — G4733 Obstructive sleep apnea (adult) (pediatric): Secondary | ICD-10-CM | POA: Diagnosis not present

## 2020-08-16 NOTE — Patient Instructions (Addendum)
After Visit Summary:  We will be checking the following labs today - NONE   Medication Instructions:    Continue with your current medicines.    If you need a refill on your cardiac medications before your next appointment, please call your pharmacy.     Testing/Procedures To Be Arranged:  N/A  Follow-Up:   See Dr. Katrinka Blazing in April/May    At [facility], you and your health needs are our priority.  As part of our continuing mission to provide you with exceptional heart care, we have created designated Provider Care Teams.  These Care Teams include your primary Cardiologist (physician) and Advanced Practice Providers (APPs -  Physician Assistants and Nurse Practitioners) who all work together to provide you with the care you need, when you need it.  Special Instructions:  . Stay safe, wash your hands for at least 20 seconds and wear a mask when needed.  . It was good to talk with you today.    Call the [facility] office at [phone] if you have any questions, problems or concerns.

## 2020-08-25 DIAGNOSIS — E039 Hypothyroidism, unspecified: Secondary | ICD-10-CM | POA: Diagnosis not present

## 2020-08-25 DIAGNOSIS — I10 Essential (primary) hypertension: Secondary | ICD-10-CM | POA: Diagnosis not present

## 2020-08-25 DIAGNOSIS — E0842 Diabetes mellitus due to underlying condition with diabetic polyneuropathy: Secondary | ICD-10-CM | POA: Diagnosis not present

## 2020-08-25 DIAGNOSIS — E7800 Pure hypercholesterolemia, unspecified: Secondary | ICD-10-CM | POA: Diagnosis not present

## 2020-08-31 DIAGNOSIS — E039 Hypothyroidism, unspecified: Secondary | ICD-10-CM | POA: Diagnosis not present

## 2020-08-31 DIAGNOSIS — E785 Hyperlipidemia, unspecified: Secondary | ICD-10-CM | POA: Diagnosis not present

## 2020-08-31 DIAGNOSIS — E1165 Type 2 diabetes mellitus with hyperglycemia: Secondary | ICD-10-CM | POA: Diagnosis not present

## 2020-08-31 DIAGNOSIS — I10 Essential (primary) hypertension: Secondary | ICD-10-CM | POA: Diagnosis not present

## 2020-09-30 DIAGNOSIS — Z87440 Personal history of urinary (tract) infections: Secondary | ICD-10-CM | POA: Diagnosis not present

## 2020-11-09 DIAGNOSIS — E039 Hypothyroidism, unspecified: Secondary | ICD-10-CM | POA: Diagnosis not present

## 2020-11-09 DIAGNOSIS — I10 Essential (primary) hypertension: Secondary | ICD-10-CM | POA: Diagnosis not present

## 2020-11-09 DIAGNOSIS — E1165 Type 2 diabetes mellitus with hyperglycemia: Secondary | ICD-10-CM | POA: Diagnosis not present

## 2020-11-09 DIAGNOSIS — E785 Hyperlipidemia, unspecified: Secondary | ICD-10-CM | POA: Diagnosis not present

## 2020-11-18 DIAGNOSIS — I2510 Atherosclerotic heart disease of native coronary artery without angina pectoris: Secondary | ICD-10-CM | POA: Diagnosis not present

## 2020-11-18 DIAGNOSIS — E1142 Type 2 diabetes mellitus with diabetic polyneuropathy: Secondary | ICD-10-CM | POA: Diagnosis not present

## 2020-11-18 DIAGNOSIS — I739 Peripheral vascular disease, unspecified: Secondary | ICD-10-CM | POA: Diagnosis not present

## 2020-11-18 DIAGNOSIS — C50919 Malignant neoplasm of unspecified site of unspecified female breast: Secondary | ICD-10-CM | POA: Diagnosis not present

## 2020-12-01 ENCOUNTER — Other Ambulatory Visit: Payer: Self-pay | Admitting: Interventional Cardiology

## 2020-12-05 DIAGNOSIS — K219 Gastro-esophageal reflux disease without esophagitis: Secondary | ICD-10-CM | POA: Diagnosis not present

## 2020-12-05 DIAGNOSIS — E039 Hypothyroidism, unspecified: Secondary | ICD-10-CM | POA: Diagnosis not present

## 2020-12-05 DIAGNOSIS — I10 Essential (primary) hypertension: Secondary | ICD-10-CM | POA: Diagnosis not present

## 2020-12-05 DIAGNOSIS — E785 Hyperlipidemia, unspecified: Secondary | ICD-10-CM | POA: Diagnosis not present

## 2020-12-20 ENCOUNTER — Ambulatory Visit: Payer: Medicare Other | Admitting: Interventional Cardiology

## 2020-12-20 DIAGNOSIS — H52223 Regular astigmatism, bilateral: Secondary | ICD-10-CM | POA: Diagnosis not present

## 2020-12-23 ENCOUNTER — Other Ambulatory Visit: Payer: Self-pay

## 2020-12-23 ENCOUNTER — Encounter: Payer: Self-pay | Admitting: Interventional Cardiology

## 2020-12-23 ENCOUNTER — Ambulatory Visit: Payer: Medicare Other | Admitting: Interventional Cardiology

## 2020-12-23 VITALS — BP 128/62 | HR 80 | Ht 62.0 in | Wt 159.2 lb

## 2020-12-23 DIAGNOSIS — I10 Essential (primary) hypertension: Secondary | ICD-10-CM

## 2020-12-23 DIAGNOSIS — E118 Type 2 diabetes mellitus with unspecified complications: Secondary | ICD-10-CM

## 2020-12-23 DIAGNOSIS — I5032 Chronic diastolic (congestive) heart failure: Secondary | ICD-10-CM

## 2020-12-23 DIAGNOSIS — G4733 Obstructive sleep apnea (adult) (pediatric): Secondary | ICD-10-CM

## 2020-12-23 DIAGNOSIS — I25709 Atherosclerosis of coronary artery bypass graft(s), unspecified, with unspecified angina pectoris: Secondary | ICD-10-CM | POA: Diagnosis not present

## 2020-12-23 DIAGNOSIS — E782 Mixed hyperlipidemia: Secondary | ICD-10-CM | POA: Diagnosis not present

## 2020-12-23 NOTE — Progress Notes (Signed)
Cardiology Office Note:    Date:  12/23/2020   ID:  Susan Barnett, DOB [birth year], MRN [id number]  PCP:  Shirline Frees, MD  Cardiologist:  Sinclair Grooms, MD   Referring MD: Shirline Frees, MD   Chief Complaint  Patient presents with  . Coronary Artery Disease  . Hypertension    History of Present Illness:    Susan Barnett is a 75 y.o. female with a hx of CAD, recent CABG for repeat in-stent restenosis of right coronary, hypertension, diabetes mellitus II, hyperlipidemia, and obstructive sleep apnea.  She is doing well.  She has been relatively sedentary.  Her right knee gives her trouble.  She does not have orthopnea PND.  She does get dyspnea on exertion.  She denies angina.  She has not needed sublingual nitroglycerin.  She has no orthopnea or lower extremity swelling.  She was on Jardiance earlier but developed a yeast infection.  Past Medical History:  Diagnosis Date  . Anemia    mild  . Anginal pain ([place])    with exertion, last time prior to cardiac caht 06/02/14  . Anxiety    situational  . Arthritis   . Breast cancer ([place]) 2018   Left Breast Cancer  . CAD (coronary artery disease)    Dr. Linard Millers follows, no problems now  . Cancer [facility])    Breast cancer  . Complication of anesthesia   . Diabetes mellitus, type 2 ([place])    TYPE 2  . Dysrhythmia    occ skips a beat, rate was fast before beta blocker.  . Gastric polyp   . Genetic testing 04/12/2017   Ms. Zito underwent genetic counseling and testing for hereditary cancer syndromes on 02/19/2017. Her results were negative for mutations in all 46 genes analyzed by [facility]'s 46-gene Common Hereditary Cancers Panel. Genes analyzed include: APC, ATM, AXIN2, BARD1, BMPR1A, BRCA1, BRCA2, BRIP1, CDH1, CDKN2A, CHEK2, CTNNA1, DICER1, EPCAM, GREM1, HOXB13, KIT, MEN1, MLH1, MSH2, MSH3, MSH6, MUTYH, NBN  . GERD (gastroesophageal reflux disease)   . Headache    hx of  . Heartburn   . Hiatal hernia    mild head  tremors  . History of radiation therapy 01/30/17- 02/21/17   Left Breast 42.56 Gy in 16 fractions  . Hypertension   . Hypothyroidism   . Occasional tremors    "of head", slight hands  . Osteopenia   . Personal history of radiation therapy 2018   Left Breast Cancer  . Pneumonia   . PONV (postoperative nausea and vomiting) 02/24/14  . Shortness of breath    with exertion  . Sleep apnea    cpap used nightly x 2 yrs now.    Past Surgical History:  Procedure Laterality Date  . BREAST BIOPSY Left 1987  . BREAST BIOPSY Left 2019  . BREAST LUMPECTOMY Left 01/25/2017  . BREAST LUMPECTOMY WITH RADIOACTIVE SEED AND SENTINEL LYMPH NODE BIOPSY Left 12/25/2016   Procedure: BREAST LUMPECTOMY WITH RADIOACTIVE SEED AND SENTINEL LYMPH NODE BIOPSY;  Surgeon: [name], MD;  Location: [place];  Service: General;  Laterality: Left;  . CARDIAC CATHETERIZATION  06/02/2014   LAD 30%, D1 80%, CFX 755, RCA > 95% ISR, rx w/ PTCA, heavy calcification, EF 65%  . CORONARY ARTERY BYPASS GRAFT N/A 06/14/2014   Procedure: CORONARY ARTERY BYPASS GRAFTING (CABG);  Surgeon: Gaye Pollack, MD;  Location: [place];  Service: Open Heart Surgery;  Laterality: N/A;  Times 2 using endoscopically harvested right saphenous vein.  Marland Kitchen  coronary stents     x3 stents placed-prior ablation  . DILATION AND CURETTAGE OF UTERUS    . ELBOW FRACTURE SURGERY Left   . INCONTINENCE SURGERY     sling  . KNEE ARTHROSCOPY Right 02/24/2014   Procedure: RIGHT KNEE ARTHROSCOPY WITH DEBRIDEMENT ;  Surgeon: Gearlean Alf, MD;  Location: [facility];  Service: Orthopedics;  Laterality: Right;  . LEFT HEART CATHETERIZATION WITH CORONARY ANGIOGRAM N/A 06/02/2014   Procedure: LEFT HEART CATHETERIZATION WITH CORONARY ANGIOGRAM;  Surgeon: Sinclair Grooms, MD;  Location: [facility] CATH LAB;  Service: Cardiovascular;  Laterality: N/A;  . TEE WITHOUT CARDIOVERSION N/A 06/14/2014   Procedure: TRANSESOPHAGEAL ECHOCARDIOGRAM (TEE);  Surgeon: Gaye Pollack, MD;   Location: [place];  Service: Open Heart Surgery;  Laterality: N/A;    Current Medications: Current Meds  Medication Sig  . amLODipine (NORVASC) 5 MG tablet Take 5 mg by mouth daily.  Marland Kitchen anastrozole (ARIMIDEX) 1 MG tablet Take 1 tablet by mouth once daily  . atorvastatin (LIPITOR) 10 MG tablet Take 10 mg by mouth every evening.   Marland Kitchen CALCIUM PO Take 1 tablet by mouth in the morning and at bedtime.  . cetirizine (ZYRTEC) 10 MG tablet Take 10 mg by mouth daily.   . cloNIDine (CATAPRES) 0.1 MG tablet TAKE ONE TABLET BY MOUTH EVERY MORNING and TAKE ONE TABLET BY MOUTH EVERY EVENING  . clopidogrel (PLAVIX) 75 MG tablet Take 75 mg by mouth daily.  . Coenzyme Q10 300 MG CAPS Take 300 mg by mouth 2 (two) times daily.  . D-Mannose 500 MG CAPS See admin instructions.  . diphenhydramine-acetaminophen (TYLENOL PM) 25-500 MG TABS tablet Take 1 tablet by mouth at bedtime as needed (For pain).   . hydrochlorothiazide (MICROZIDE) 12.5 MG capsule Take 1 capsule (12.5 mg total) by mouth every Monday, Wednesday, and Friday.  Marland Kitchen LANTUS SOLOSTAR 100 UNIT/ML Solostar Pen Inject 40 Units into the skin at bedtime.  Marland Kitchen letrozole (FEMARA) 2.5 MG tablet Take 1 tablet (2.5 mg total) by mouth daily.  Marland Kitchen levothyroxine (SYNTHROID, LEVOTHROID) 75 MCG tablet Take 75 mcg by mouth daily before breakfast.   . loratadine (CLARITIN) 10 MG tablet 1 tablet  . losartan (COZAAR) 100 MG tablet Take 1 tablet (100 mg total) by mouth daily.  . metoprolol tartrate (LOPRESSOR) 50 MG tablet Take 2 tablets (100 mg total) by mouth 2 (two) times daily.  . miconazole (ANTIFUNGAL) 2 % powder as needed.  . Multiple Vitamin (MULTIVITAMIN ADULT PO) Take by mouth.  Glory Rosebush VERIO test strip 1 each 2 (two) times daily.  . pantoprazole (PROTONIX) 40 MG tablet Take 1 tablet (40 mg total) by mouth daily before breakfast.  . Polyethyl Glycol-Propyl Glycol (SYSTANE OP) Place 1 drop into both eyes as needed.   . Probiotic Product (ALIGN) 4 MG CAPS daily in  the afternoon.  . sitaGLIPtin-metformin (JANUMET) 50-1000 MG tablet Take 1 tablet by mouth 2 (two) times daily with a meal.     Allergies:   Asa [aspirin], Compazine [prochlorperazine edisylate], Nsaids, Shellfish allergy, Sulfa antibiotics, Zantac [ranitidine hcl], and Lisinopril   Social History   Socioeconomic History  . Marital status: Married    Spouse name: Not on file  . Number of children: Not on file  . Years of education: Not on file  . Highest education level: Not on file  Occupational History  . Not on file  Tobacco Use  . Smoking status: Never Smoker  . Smokeless tobacco: Never Used  Vaping  Use  . Vaping Use: Never used  Substance and Sexual Activity  . Alcohol use: No  . Drug use: No  . Sexual activity: Not Currently  Other Topics Concern  . Not on file  Social History Narrative  . Not on file   Social Determinants of Health   Financial Resource Strain: Not on file  Food Insecurity: Not on file  Transportation Needs: Not on file  Physical Activity: Not on file  Stress: Not on file  Social Connections: Not on file     Family History: The patient's family history includes Breast cancer (age of onset: 76) in her cousin; Diabetes in her mother; Healthy in her brother; Heart attack in her mother; Hodgkin's lymphoma in her maternal uncle; Hypertension in her mother; Lung cancer in her brother; Ulcerative colitis in her daughter.  ROS:   Please see the history of present illness.    Right knee arthritis prevents walking.  She was in phase 3 cardiac rehab for greater than 10 years.  Since COVID pandemic began, physical activity has significantly decreased.  All other systems reviewed and are negative.  EKGs/Labs/Other Studies Reviewed:    The following studies were reviewed today: 2D Doppler echocardiogram 2020: Study Conclusions   - Left ventricle: The cavity size was normal. Wall thickness was  increased in a pattern of mild LVH. There was mild focal  basal  hypertrophy of the septum. Systolic function was normal. The  estimated ejection fraction was in the range of 55% to 60%. Wall  motion was normal; there were no regional wall motion  abnormalities. Features are consistent with a pseudonormal left  ventricular filling pattern, with concomitant abnormal relaxation  and increased filling pressure (grade 2 diastolic dysfunction).  Doppler parameters are consistent with high ventricular filling  pressure.  - Aortic valve: There was trivial regurgitation.  - Mitral valve: Calcified annulus. There was mild regurgitation.  - Left atrium: The atrium was mildly dilated.  - Pulmonary arteries: Systolic pressure was mildly increased. PA  peak pressure: 32 mm Hg (S).   Impressions:   - Normal LV systolic function; moderate diastolic dysfunction; mild  LVH; trace AI; mild MR; mild LAE; trace TR with mild pulmonary  hypertension.   EKG:  EKG 05/11/2020, small inferior Q waves, otherwise normal.  A tracing is not repeated today.  Recent Labs: 05/10/2020: Hemoglobin 12.1; Platelets 341 06/20/2020: BUN 12; Creatinine, Ser 0.47; Potassium 4.3; Sodium 139  Recent Lipid Panel    Component Value Date/Time   CHOL  08/13/2007 0615    116        ATP III CLASSIFICATION:  <200     mg/dL   Desirable  200-239  mg/dL   Borderline High  >=240    mg/dL   High   TRIG 69 08/13/2007 0615   HDL 41 08/13/2007 0615   CHOLHDL 2.8 08/13/2007 0615   VLDL 14 08/13/2007 0615   LDLCALC  08/13/2007 0615    61        Total Cholesterol/HDL:CHD Risk Coronary Heart Disease Risk Table                     Men   Women  1/2 Average Risk   3.4   3.3    Physical Exam:    VS:  BP 128/62   Pulse 80   Ht 5' 2" (1.575 m)   Wt 159 lb 3.2 oz (72.2 kg)   SpO2 98%   BMI 29.12 kg/m  Wt Readings from Last 3 Encounters:  12/23/20 159 lb 3.2 oz (72.2 kg)  08/16/20 151 lb (68.5 kg)  06/10/20 155 lb 6.4 oz (70.5 kg)     GEN: Mildly overweight.  No acute distress HEENT: Normal NECK: No JVD. LYMPHATICS: No lymphadenopathy CARDIAC: No murmur. RRR no gallop, or edema. VASCULAR:  Normal Pulses. No bruits. RESPIRATORY:  Clear to auscultation without rales, wheezing or rhonchi  ABDOMEN: Soft, non-tender, non-distended, No pulsatile mass, MUSCULOSKELETAL: No deformity  SKIN: Warm and dry NEUROLOGIC:  Alert and oriented x 3 PSYCHIATRIC:  Normal affect   ASSESSMENT:    1. Coronary artery disease involving coronary bypass graft of native heart with angina pectoris ([place])   2. Essential hypertension   3. Mixed hyperlipidemia   4. OSA (obstructive sleep apnea)   5. Type 2 diabetes mellitus with complications ([place])   6. Diastolic dysfunction with chronic heart failure (HCC)    PLAN:    In order of problems listed above:  1. Secondary prevention measures were reviewed.  She needs to increase physical activity. 2. Excellent blood pressure control.  Continue losartan Catapres, Microzide. 3. She is currently taking Lipitor 10 mg/day and has an LDL less than 20.  No change in therapy.  Triglyceride is greater than 250.  We discussed Vascepa.  It would be cost prohibitive for her. 4. Compliant with CPAP. 5. Discussed SGLT2 therapy that will provide cardioprotection.  She has failed on Jardiance due to yeast infection. 6. SGLT2 therapy was discussed because it has been proven Benefits relative to risk reduction in patients with diastolic heart failure.  She is not eligible because of yeast infection.  Overall education and awareness concerning secondary risk prevention was discussed in detail: LDL less than 70, hemoglobin A1c less than 7, blood pressure target less than 130/80 mmHg, >150 minutes of moderate aerobic activity per week, avoidance of smoking, weight control (via diet and exercise), and continued surveillance/management of/for obstructive sleep apnea.    Medication Adjustments/Labs and Tests Ordered: Current medicines are  reviewed at length with the patient today.  Concerns regarding medicines are outlined above.  No orders of the defined types were placed in this encounter.  No orders of the defined types were placed in this encounter.   There are no Patient Instructions on file for this visit.   Signed, Sinclair Grooms, MD  12/23/2020 2:20 PM    [facility]

## 2020-12-23 NOTE — Patient Instructions (Signed)
Medication Instructions:  Your physician recommends that you continue on your current medications as directed. Please refer to the Current Medication list given to you today.  *If you need a refill on your cardiac medications before your next appointment, please call your pharmacy*   Lab Work: None If you have labs (blood work) drawn today and your tests are completely normal, you will receive your results only by: . MyChart Message (if you have MyChart) OR . A paper copy in the mail If you have any lab test that is abnormal or we need to change your treatment, we will call you to review the results.   Testing/Procedures: None   Follow-Up: At [facility], you and your health needs are our priority.  As part of our continuing mission to provide you with exceptional heart care, we have created designated Provider Care Teams.  These Care Teams include your primary Cardiologist (physician) and Advanced Practice Providers (APPs -  Physician Assistants and Nurse Practitioners) who all work together to provide you with the care you need, when you need it.  We recommend signing up for the patient portal called "MyChart".  Sign up information is provided on this After Visit Summary.  MyChart is used to connect with patients for Virtual Visits (Telemedicine).  Patients are able to view lab/test results, encounter notes, upcoming appointments, etc.  Non-urgent messages can be sent to your provider as well.   To learn more about what you can do with MyChart, go to [email or web address].    Your next appointment:   1 year(s)  The format for your next appointment:   In Person  Provider:   You may see Henry W Smith III, MD or one of the following Advanced Practice Providers on your designated Care Team:    Jill McDaniel, NP    Other Instructions  Your provider recommends that you maintain 150 minutes per week of moderate aerobic activity.   

## 2021-01-04 DIAGNOSIS — I25119 Atherosclerotic heart disease of native coronary artery with unspecified angina pectoris: Secondary | ICD-10-CM | POA: Diagnosis not present

## 2021-01-04 DIAGNOSIS — E785 Hyperlipidemia, unspecified: Secondary | ICD-10-CM | POA: Diagnosis not present

## 2021-01-04 DIAGNOSIS — E039 Hypothyroidism, unspecified: Secondary | ICD-10-CM | POA: Diagnosis not present

## 2021-01-04 DIAGNOSIS — I10 Essential (primary) hypertension: Secondary | ICD-10-CM | POA: Diagnosis not present

## 2021-01-31 DIAGNOSIS — I10 Essential (primary) hypertension: Secondary | ICD-10-CM | POA: Diagnosis not present

## 2021-01-31 DIAGNOSIS — M5136 Other intervertebral disc degeneration, lumbar region: Secondary | ICD-10-CM | POA: Diagnosis not present

## 2021-01-31 DIAGNOSIS — E109 Type 1 diabetes mellitus without complications: Secondary | ICD-10-CM | POA: Diagnosis not present

## 2021-01-31 DIAGNOSIS — M545 Low back pain, unspecified: Secondary | ICD-10-CM | POA: Diagnosis not present

## 2021-02-06 ENCOUNTER — Other Ambulatory Visit: Payer: Self-pay

## 2021-02-06 ENCOUNTER — Ambulatory Visit (HOSPITAL_COMMUNITY)
Admission: EM | Admit: 2021-02-06 | Discharge: 2021-02-06 | Disposition: A | Discharge status: Home / Self Care | Payer: Medicare Other

## 2021-02-06 DIAGNOSIS — N950 Postmenopausal bleeding: Secondary | ICD-10-CM | POA: Diagnosis not present

## 2021-02-06 DIAGNOSIS — R42 Dizziness and giddiness: Secondary | ICD-10-CM | POA: Diagnosis not present

## 2021-02-06 DIAGNOSIS — I2510 Atherosclerotic heart disease of native coronary artery without angina pectoris: Secondary | ICD-10-CM | POA: Diagnosis not present

## 2021-02-06 NOTE — ED Notes (Signed)
Pt leaving. States she thinks she is exaggerating and declines visit.

## 2021-02-07 DIAGNOSIS — N950 Postmenopausal bleeding: Secondary | ICD-10-CM | POA: Diagnosis not present

## 2021-02-07 DIAGNOSIS — E785 Hyperlipidemia, unspecified: Secondary | ICD-10-CM | POA: Diagnosis not present

## 2021-02-07 DIAGNOSIS — I10 Essential (primary) hypertension: Secondary | ICD-10-CM | POA: Diagnosis not present

## 2021-02-07 DIAGNOSIS — I25119 Atherosclerotic heart disease of native coronary artery with unspecified angina pectoris: Secondary | ICD-10-CM | POA: Diagnosis not present

## 2021-02-07 DIAGNOSIS — E039 Hypothyroidism, unspecified: Secondary | ICD-10-CM | POA: Diagnosis not present

## 2021-02-15 DIAGNOSIS — H26492 Other secondary cataract, left eye: Secondary | ICD-10-CM | POA: Diagnosis not present

## 2021-02-16 DIAGNOSIS — R42 Dizziness and giddiness: Secondary | ICD-10-CM | POA: Diagnosis not present

## 2021-02-16 DIAGNOSIS — D72829 Elevated white blood cell count, unspecified: Secondary | ICD-10-CM | POA: Diagnosis not present

## 2021-03-01 ENCOUNTER — Telehealth: Payer: Self-pay | Admitting: Interventional Cardiology

## 2021-03-01 NOTE — Telephone Encounter (Signed)
STAT if patient feels like he/she is going to faint   Are you dizzy now? NO   Do you feel faint or have you passed out? No  Do you have any other symptoms? Unknown  Have you checked your HR and BP (record if available)? 170/unknown they checked when she went to urgent care

## 2021-03-01 NOTE — Telephone Encounter (Signed)
Pt has been having issues with postural dizziness recently.  Dizziness is worse when lying down but doesn't occur everyday.  Has to wait a few seconds after sitting up before she can stand up and go.  Does check her vitals but didn't have the readings.  States BP is always a little high.  Went to GYN recently for vaginal bleeding and this is when she had the first occurrence of dizziness.  They sent her to Urgent Care.  Urgent Care did an EKG and told pt she had changes and to contact our office.  They gave her a copy.  I have asked her to drop this off at our office which she will do tomorrow.  Been having blood issues as well with it dropping too low.  Advised pt once I receive the EKG, I will have Dr. Tamala Julian review it and I will be in touch with any recommendations he may have.  Pt appreciative for call.  Pt is seeing PCP this week and will review with them as well.

## 2021-03-02 DIAGNOSIS — H8113 Benign paroxysmal vertigo, bilateral: Secondary | ICD-10-CM | POA: Diagnosis not present

## 2021-03-02 DIAGNOSIS — I10 Essential (primary) hypertension: Secondary | ICD-10-CM | POA: Diagnosis not present

## 2021-03-02 DIAGNOSIS — Z0000 Encounter for general adult medical examination without abnormal findings: Secondary | ICD-10-CM | POA: Diagnosis not present

## 2021-03-02 DIAGNOSIS — E7800 Pure hypercholesterolemia, unspecified: Secondary | ICD-10-CM | POA: Diagnosis not present

## 2021-03-03 NOTE — Telephone Encounter (Signed)
Dizziness worsening with lying down suggest vertigo. ECG has chronic abnormality.

## 2021-03-03 NOTE — Telephone Encounter (Signed)
Spoke with pt and made her aware of recommendation.  Pt verbalized understanding and was in agreement with plan.

## 2021-03-07 DIAGNOSIS — I10 Essential (primary) hypertension: Secondary | ICD-10-CM | POA: Diagnosis not present

## 2021-03-07 DIAGNOSIS — E785 Hyperlipidemia, unspecified: Secondary | ICD-10-CM | POA: Diagnosis not present

## 2021-03-07 DIAGNOSIS — E0842 Diabetes mellitus due to underlying condition with diabetic polyneuropathy: Secondary | ICD-10-CM | POA: Diagnosis not present

## 2021-03-07 DIAGNOSIS — E039 Hypothyroidism, unspecified: Secondary | ICD-10-CM | POA: Diagnosis not present

## 2021-03-08 DIAGNOSIS — N950 Postmenopausal bleeding: Secondary | ICD-10-CM | POA: Diagnosis not present

## 2021-03-23 DIAGNOSIS — I2510 Atherosclerotic heart disease of native coronary artery without angina pectoris: Secondary | ICD-10-CM | POA: Diagnosis not present

## 2021-03-23 DIAGNOSIS — E1142 Type 2 diabetes mellitus with diabetic polyneuropathy: Secondary | ICD-10-CM | POA: Diagnosis not present

## 2021-03-23 DIAGNOSIS — I739 Peripheral vascular disease, unspecified: Secondary | ICD-10-CM | POA: Diagnosis not present

## 2021-03-23 DIAGNOSIS — C50919 Malignant neoplasm of unspecified site of unspecified female breast: Secondary | ICD-10-CM | POA: Diagnosis not present

## 2021-03-31 DIAGNOSIS — Z87440 Personal history of urinary (tract) infections: Secondary | ICD-10-CM | POA: Diagnosis not present

## 2021-04-04 DIAGNOSIS — E785 Hyperlipidemia, unspecified: Secondary | ICD-10-CM | POA: Diagnosis not present

## 2021-04-04 DIAGNOSIS — E0842 Diabetes mellitus due to underlying condition with diabetic polyneuropathy: Secondary | ICD-10-CM | POA: Diagnosis not present

## 2021-04-04 DIAGNOSIS — I10 Essential (primary) hypertension: Secondary | ICD-10-CM | POA: Diagnosis not present

## 2021-04-04 DIAGNOSIS — E039 Hypothyroidism, unspecified: Secondary | ICD-10-CM | POA: Diagnosis not present

## 2021-04-10 DIAGNOSIS — I788 Other diseases of capillaries: Secondary | ICD-10-CM | POA: Diagnosis not present

## 2021-04-10 DIAGNOSIS — L821 Other seborrheic keratosis: Secondary | ICD-10-CM | POA: Diagnosis not present

## 2021-04-10 DIAGNOSIS — L720 Epidermal cyst: Secondary | ICD-10-CM | POA: Diagnosis not present

## 2021-04-10 DIAGNOSIS — L918 Other hypertrophic disorders of the skin: Secondary | ICD-10-CM | POA: Diagnosis not present

## 2021-05-04 DIAGNOSIS — E785 Hyperlipidemia, unspecified: Secondary | ICD-10-CM | POA: Diagnosis not present

## 2021-05-04 DIAGNOSIS — I10 Essential (primary) hypertension: Secondary | ICD-10-CM | POA: Diagnosis not present

## 2021-05-04 DIAGNOSIS — E1165 Type 2 diabetes mellitus with hyperglycemia: Secondary | ICD-10-CM | POA: Diagnosis not present

## 2021-05-04 DIAGNOSIS — E039 Hypothyroidism, unspecified: Secondary | ICD-10-CM | POA: Diagnosis not present

## 2021-05-18 DIAGNOSIS — Z23 Encounter for immunization: Secondary | ICD-10-CM | POA: Diagnosis not present

## 2021-05-26 ENCOUNTER — Ambulatory Visit: Payer: Medicare Other | Admitting: Interventional Cardiology

## 2021-05-29 ENCOUNTER — Other Ambulatory Visit: Payer: Self-pay | Admitting: Interventional Cardiology

## 2021-05-29 DIAGNOSIS — I10 Essential (primary) hypertension: Secondary | ICD-10-CM

## 2021-06-02 DIAGNOSIS — E0842 Diabetes mellitus due to underlying condition with diabetic polyneuropathy: Secondary | ICD-10-CM | POA: Diagnosis not present

## 2021-06-02 DIAGNOSIS — E039 Hypothyroidism, unspecified: Secondary | ICD-10-CM | POA: Diagnosis not present

## 2021-06-02 DIAGNOSIS — E785 Hyperlipidemia, unspecified: Secondary | ICD-10-CM | POA: Diagnosis not present

## 2021-06-02 DIAGNOSIS — I10 Essential (primary) hypertension: Secondary | ICD-10-CM | POA: Diagnosis not present

## 2021-06-05 DIAGNOSIS — M25571 Pain in right ankle and joints of right foot: Secondary | ICD-10-CM | POA: Diagnosis not present

## 2021-06-26 DIAGNOSIS — I10 Essential (primary) hypertension: Secondary | ICD-10-CM | POA: Diagnosis not present

## 2021-06-26 DIAGNOSIS — M545 Low back pain, unspecified: Secondary | ICD-10-CM | POA: Diagnosis not present

## 2021-06-26 DIAGNOSIS — E109 Type 1 diabetes mellitus without complications: Secondary | ICD-10-CM | POA: Diagnosis not present

## 2021-06-26 DIAGNOSIS — M5136 Other intervertebral disc degeneration, lumbar region: Secondary | ICD-10-CM | POA: Diagnosis not present

## 2021-06-27 ENCOUNTER — Other Ambulatory Visit: Payer: Self-pay | Admitting: Interventional Cardiology

## 2021-06-30 DIAGNOSIS — E0842 Diabetes mellitus due to underlying condition with diabetic polyneuropathy: Secondary | ICD-10-CM | POA: Diagnosis not present

## 2021-06-30 DIAGNOSIS — E039 Hypothyroidism, unspecified: Secondary | ICD-10-CM | POA: Diagnosis not present

## 2021-06-30 DIAGNOSIS — E785 Hyperlipidemia, unspecified: Secondary | ICD-10-CM | POA: Diagnosis not present

## 2021-06-30 DIAGNOSIS — I10 Essential (primary) hypertension: Secondary | ICD-10-CM | POA: Diagnosis not present

## 2021-07-10 ENCOUNTER — Other Ambulatory Visit: Payer: Self-pay | Admitting: Hematology and Oncology

## 2021-07-10 ENCOUNTER — Telehealth: Payer: Self-pay | Admitting: *Deleted

## 2021-07-10 MED ORDER — LETROZOLE 2.5 MG PO TABS
2.5000 mg | ORAL_TABLET | Freq: Every day | ORAL | 0 refills | Status: DC
Start: 2021-07-10 — End: 2021-09-28

## 2021-07-10 NOTE — Telephone Encounter (Signed)
Refill request for Letrozole sent to pharmacy on file

## 2021-07-12 ENCOUNTER — Telehealth: Payer: Self-pay | Admitting: Hematology and Oncology

## 2021-07-12 NOTE — Telephone Encounter (Signed)
Scheduled per sch msg. Called and left msg. Mailed printout  

## 2021-07-15 DIAGNOSIS — S92355A Nondisplaced fracture of fifth metatarsal bone, left foot, initial encounter for closed fracture: Secondary | ICD-10-CM | POA: Diagnosis not present

## 2021-07-15 DIAGNOSIS — S8265XA Nondisplaced fracture of lateral malleolus of left fibula, initial encounter for closed fracture: Secondary | ICD-10-CM | POA: Diagnosis not present

## 2021-07-20 DIAGNOSIS — S8265XA Nondisplaced fracture of lateral malleolus of left fibula, initial encounter for closed fracture: Secondary | ICD-10-CM | POA: Diagnosis not present

## 2021-07-20 DIAGNOSIS — M79672 Pain in left foot: Secondary | ICD-10-CM | POA: Diagnosis not present

## 2021-07-20 DIAGNOSIS — S92355A Nondisplaced fracture of fifth metatarsal bone, left foot, initial encounter for closed fracture: Secondary | ICD-10-CM | POA: Diagnosis not present

## 2021-07-20 DIAGNOSIS — M25572 Pain in left ankle and joints of left foot: Secondary | ICD-10-CM | POA: Diagnosis not present

## 2021-08-10 DIAGNOSIS — I10 Essential (primary) hypertension: Secondary | ICD-10-CM | POA: Diagnosis not present

## 2021-08-10 DIAGNOSIS — E0842 Diabetes mellitus due to underlying condition with diabetic polyneuropathy: Secondary | ICD-10-CM | POA: Diagnosis not present

## 2021-08-10 DIAGNOSIS — E785 Hyperlipidemia, unspecified: Secondary | ICD-10-CM | POA: Diagnosis not present

## 2021-08-10 DIAGNOSIS — K219 Gastro-esophageal reflux disease without esophagitis: Secondary | ICD-10-CM | POA: Diagnosis not present

## 2021-08-31 DIAGNOSIS — E7800 Pure hypercholesterolemia, unspecified: Secondary | ICD-10-CM | POA: Diagnosis not present

## 2021-08-31 DIAGNOSIS — M79672 Pain in left foot: Secondary | ICD-10-CM | POA: Diagnosis not present

## 2021-08-31 DIAGNOSIS — I10 Essential (primary) hypertension: Secondary | ICD-10-CM | POA: Diagnosis not present

## 2021-08-31 DIAGNOSIS — S92355A Nondisplaced fracture of fifth metatarsal bone, left foot, initial encounter for closed fracture: Secondary | ICD-10-CM | POA: Diagnosis not present

## 2021-08-31 DIAGNOSIS — M79671 Pain in right foot: Secondary | ICD-10-CM | POA: Diagnosis not present

## 2021-08-31 DIAGNOSIS — E039 Hypothyroidism, unspecified: Secondary | ICD-10-CM | POA: Diagnosis not present

## 2021-08-31 DIAGNOSIS — E1165 Type 2 diabetes mellitus with hyperglycemia: Secondary | ICD-10-CM | POA: Diagnosis not present

## 2021-08-31 DIAGNOSIS — M25572 Pain in left ankle and joints of left foot: Secondary | ICD-10-CM | POA: Diagnosis not present

## 2021-09-12 DIAGNOSIS — M79651 Pain in right thigh: Secondary | ICD-10-CM | POA: Diagnosis not present

## 2021-09-12 DIAGNOSIS — M5136 Other intervertebral disc degeneration, lumbar region: Secondary | ICD-10-CM | POA: Diagnosis not present

## 2021-09-12 DIAGNOSIS — E109 Type 1 diabetes mellitus without complications: Secondary | ICD-10-CM | POA: Diagnosis not present

## 2021-09-12 DIAGNOSIS — I10 Essential (primary) hypertension: Secondary | ICD-10-CM | POA: Diagnosis not present

## 2021-09-25 DIAGNOSIS — E1142 Type 2 diabetes mellitus with diabetic polyneuropathy: Secondary | ICD-10-CM | POA: Diagnosis not present

## 2021-09-25 DIAGNOSIS — I739 Peripheral vascular disease, unspecified: Secondary | ICD-10-CM | POA: Diagnosis not present

## 2021-09-25 DIAGNOSIS — I10 Essential (primary) hypertension: Secondary | ICD-10-CM | POA: Diagnosis not present

## 2021-09-25 DIAGNOSIS — G4733 Obstructive sleep apnea (adult) (pediatric): Secondary | ICD-10-CM | POA: Diagnosis not present

## 2021-09-28 ENCOUNTER — Other Ambulatory Visit: Payer: Self-pay | Admitting: Hematology and Oncology

## 2021-09-29 ENCOUNTER — Telehealth: Payer: Self-pay | Admitting: Hematology and Oncology

## 2021-09-29 NOTE — Telephone Encounter (Signed)
Sch per 2/16 inbasket, pt aware

## 2021-10-06 DIAGNOSIS — S92355A Nondisplaced fracture of fifth metatarsal bone, left foot, initial encounter for closed fracture: Secondary | ICD-10-CM | POA: Diagnosis not present

## 2021-10-06 DIAGNOSIS — M25561 Pain in right knee: Secondary | ICD-10-CM | POA: Diagnosis not present

## 2021-10-06 DIAGNOSIS — M170 Bilateral primary osteoarthritis of knee: Secondary | ICD-10-CM | POA: Diagnosis not present

## 2021-10-06 DIAGNOSIS — S8265XA Nondisplaced fracture of lateral malleolus of left fibula, initial encounter for closed fracture: Secondary | ICD-10-CM | POA: Diagnosis not present

## 2021-10-09 DIAGNOSIS — K219 Gastro-esophageal reflux disease without esophagitis: Secondary | ICD-10-CM | POA: Diagnosis not present

## 2021-10-09 DIAGNOSIS — E7800 Pure hypercholesterolemia, unspecified: Secondary | ICD-10-CM | POA: Diagnosis not present

## 2021-10-09 DIAGNOSIS — I10 Essential (primary) hypertension: Secondary | ICD-10-CM | POA: Diagnosis not present

## 2021-10-09 DIAGNOSIS — E1165 Type 2 diabetes mellitus with hyperglycemia: Secondary | ICD-10-CM | POA: Diagnosis not present

## 2021-10-10 ENCOUNTER — Inpatient Hospital Stay: Payer: Medicare Other | Admitting: Hematology and Oncology

## 2021-10-25 ENCOUNTER — Other Ambulatory Visit: Payer: Self-pay | Admitting: Hematology and Oncology

## 2021-10-25 DIAGNOSIS — Z9889 Other specified postprocedural states: Secondary | ICD-10-CM

## 2021-10-31 DIAGNOSIS — I5032 Chronic diastolic (congestive) heart failure: Secondary | ICD-10-CM | POA: Diagnosis not present

## 2021-10-31 DIAGNOSIS — I10 Essential (primary) hypertension: Secondary | ICD-10-CM | POA: Diagnosis not present

## 2021-10-31 DIAGNOSIS — E7800 Pure hypercholesterolemia, unspecified: Secondary | ICD-10-CM | POA: Diagnosis not present

## 2021-10-31 DIAGNOSIS — E1165 Type 2 diabetes mellitus with hyperglycemia: Secondary | ICD-10-CM | POA: Diagnosis not present

## 2021-11-03 ENCOUNTER — Other Ambulatory Visit: Payer: Self-pay | Admitting: Interventional Cardiology

## 2021-11-03 ENCOUNTER — Other Ambulatory Visit: Payer: Self-pay | Admitting: Hematology and Oncology

## 2021-11-06 ENCOUNTER — Ambulatory Visit: Payer: Medicare Other | Admitting: Hematology and Oncology

## 2021-11-09 DIAGNOSIS — M1712 Unilateral primary osteoarthritis, left knee: Secondary | ICD-10-CM | POA: Diagnosis not present

## 2021-11-09 DIAGNOSIS — M170 Bilateral primary osteoarthritis of knee: Secondary | ICD-10-CM | POA: Diagnosis not present

## 2021-11-09 DIAGNOSIS — M1711 Unilateral primary osteoarthritis, right knee: Secondary | ICD-10-CM | POA: Diagnosis not present

## 2021-11-14 ENCOUNTER — Ambulatory Visit
Admission: RE | Admit: 2021-11-14 | Discharge: 2021-11-14 | Disposition: A | Discharge status: Home / Self Care | Payer: Medicare Other | Source: Ambulatory Visit | Attending: Hematology and Oncology | Admitting: Hematology and Oncology

## 2021-11-14 DIAGNOSIS — R922 Inconclusive mammogram: Secondary | ICD-10-CM | POA: Diagnosis not present

## 2021-11-14 DIAGNOSIS — Z9889 Other specified postprocedural states: Secondary | ICD-10-CM

## 2021-11-14 DIAGNOSIS — Z853 Personal history of malignant neoplasm of breast: Secondary | ICD-10-CM | POA: Diagnosis not present

## 2021-11-16 DIAGNOSIS — M170 Bilateral primary osteoarthritis of knee: Secondary | ICD-10-CM | POA: Diagnosis not present

## 2021-11-16 DIAGNOSIS — S92355A Nondisplaced fracture of fifth metatarsal bone, left foot, initial encounter for closed fracture: Secondary | ICD-10-CM | POA: Diagnosis not present

## 2021-11-16 DIAGNOSIS — S8265XA Nondisplaced fracture of lateral malleolus of left fibula, initial encounter for closed fracture: Secondary | ICD-10-CM | POA: Diagnosis not present

## 2021-11-17 NOTE — Progress Notes (Signed)
? ?Patient Care Team: ?Shirline Frees, MD as PCP - General (Family Medicine) ?[name], MD as PCP - Cardiology (Cardiology) ?Sueanne Margarita, MD as PCP - Sleep Medicine (Cardiology) ?Gardenia Phlegm, NP as Nurse Practitioner (Hematology and Oncology) ?[name], MD as Consulting Physician (Hematology and Oncology) ?Eppie Gibson, MD as Attending Physician (Radiation Oncology) ?[name], MD (Inactive) as Consulting Physician (General Surgery) ? ?DIAGNOSIS:  ?Encounter Diagnosis  ?Name Primary?  ? Malignant neoplasm of upper-outer quadrant of left breast in female, estrogen receptor positive ([place])   ? ? ?SUMMARY OF ONCOLOGIC HISTORY: ?Oncology History  ?Malignant neoplasm of upper-outer quadrant of left breast in female, estrogen receptor positive ([place])  ?10/01/2016 Initial Diagnosis  ? Left breast biopsy: 12:30: IDC with DCIS involving underlying papillary lesion, grade 1, ER 100%, PR 90%, Ki-67 10%, HER-2 negative ratio 1.29; irregular 1.9 cm mass axilla negative, T1 CN 0 stage IA clinical stage ? ?  ?12/25/2016 Surgery  ? Left lumpectomy: IDC grade 1, 1.5 cm, low-grade DCIS, resection margins negative, 0/2 lymph nodes negative, ER 100%, PR 100%, HER-2 negative ratio 1.29, Ki-67 10% T1 cN0 stage IA ? ?  ?12/25/2016 Oncotype testing  ? Oncotype DX score 2: Risk of recurrence 4% ? ?  ?01/30/2017 - 02/21/2017 Radiation Therapy  ? Adjuvant radiation therapy ? ?  ?02/2017 -  Anti-estrogen oral therapy  ? Anastrozole daily ? ?  ?03/25/2017 Genetic Testing  ? Genetic counseling and testing for hereditary cancer syndromes performed on 02/19/2017. Results are negative for pathogenic mutations in 46 genes analyzed by [facility]'s Common Hereditary Cancers Panel. Results are dated 03/25/2017. Genes tested: APC, ATM, AXIN2, BARD1, BMPR1A, BRCA1, BRCA2, BRIP1, CDH1, CDKN2A, CHEK2, CTNNA1, DICER1, EPCAM, GREM1, HOXB13, KIT, MEN1, MLH1, MSH2, MSH3, MSH6, MUTYH, NBN, NF1, NTHL1, PALB2, PDGFRA, PMS2, POLD1,  POLE, PTEN, RAD50, RAD51C, RAD51D, SDHA, SDHB, SDHC, SDHD, SMAD4, SMARCA4, STK11, TP53, TSC1, TSC2, and VHL. ? ? ? ?  ? ? ?CHIEF COMPLIANT: Follow-up of breast cancer estrogen receptor positive on Letrozole ? ?INTERVAL HISTORY: Susan Barnett is a ?76 y.o. female with above-mentioned history of left breast cancer treated with lumpectomy, radiation, and who is currently on Letrozole therapy. She presents to the clinic today for a follow-up. She states she wants to stop the letrozole. She also complains of multiple falls. She complains of knee pain. ? ?ALLERGIES:  is allergic to asa [aspirin], compazine [prochlorperazine edisylate], nsaids, shellfish allergy, sulfa antibiotics, zantac [ranitidine hcl], lisinopril, and zantac [ranitidine]. ? ?MEDICATIONS:  ?Current Outpatient Medications  ?Medication Sig Dispense Refill  ? amLODipine (NORVASC) 5 MG tablet Take 5 mg by mouth daily.    ? atorvastatin (LIPITOR) 10 MG tablet Take 10 mg by mouth every evening.     ? CALCIUM PO Take 1 tablet by mouth in the morning and at bedtime.    ? cetirizine (ZYRTEC) 10 MG tablet Take 10 mg by mouth daily.     ? cloNIDine (CATAPRES) 0.1 MG tablet TAKE ONE TABLET BY MOUTH EVERY MORNING and TAKE ONE TABLET BY MOUTH EVERY EVENING 180 tablet 0  ? clopidogrel (PLAVIX) 75 MG tablet Take 75 mg by mouth daily.    ? Coenzyme Q10 300 MG CAPS Take 300 mg by mouth 2 (two) times daily.    ? D-Mannose 500 MG CAPS See admin instructions.    ? diphenhydramine-acetaminophen (TYLENOL PM) 25-500 MG TABS tablet Take 1 tablet by mouth at bedtime as needed (For pain).     ? hydrochlorothiazide (MICROZIDE) 12.5 MG capsule  Take 1 capsule by mouth on Monday, Wednesday and Friday. 45 capsule 1  ? LANTUS SOLOSTAR 100 UNIT/ML Solostar Pen Inject 40 Units into the skin at bedtime. 15 mL 0  ? levothyroxine (SYNTHROID, LEVOTHROID) 75 MCG tablet Take 75 mcg by mouth daily before breakfast.     ? loratadine (CLARITIN) 10 MG tablet 1 tablet    ? losartan (COZAAR) 100  MG tablet Take 1 tablet (100 mg total) by mouth daily. 90 tablet 3  ? metoprolol tartrate (LOPRESSOR) 50 MG tablet TAKE TWO TABLETS BY MOUTH EVERY MORNING and TAKE TWO TABLETS BY MOUTH EVERY EVENING 360 tablet 3  ? miconazole (ANTIFUNGAL) 2 % powder as needed.    ? Multiple Vitamin (MULTIVITAMIN ADULT PO) Take by mouth.    ? nitroGLYCERIN (NITROSTAT) 0.4 MG SL tablet Place 1 tablet (0.4 mg total) under the tongue every 5 (five) minutes as needed for chest pain. 25 tablet 3  ? ONETOUCH VERIO test strip 1 each 2 (two) times daily.    ? pantoprazole (PROTONIX) 40 MG tablet Take 1 tablet (40 mg total) by mouth daily before breakfast. 90 tablet 3  ? Polyethyl Glycol-Propyl Glycol (SYSTANE OP) Place 1 drop into both eyes as needed.     ? Probiotic Product (ALIGN) 4 MG CAPS daily in the afternoon.    ? sitaGLIPtin-metformin (JANUMET) 50-1000 MG tablet Take 1 tablet by mouth 2 (two) times daily with a meal.    ? ?No current facility-administered medications for this visit.  ? ? ?PHYSICAL EXAMINATION: ?ECOG PERFORMANCE STATUS: 1 - Symptomatic but completely ambulatory ? ?Vitals:  ? 12/01/21 1136  ?BP: (!) 154/63  ?Pulse: 73  ?Resp: 18  ?Temp: (!) 97.2 ?F (36.2 ?C)  ?SpO2: 99%  ? ?Filed Weights  ? 12/01/21 1136  ?Weight: 158 lb 6.4 oz (71.8 kg)  ? ?  ? ?LABORATORY DATA:  ?I have reviewed the data as listed ? ?  Latest Ref Rng & Units 06/20/2020  ?  2:35 PM 05/10/2020  ?  4:05 PM 12/21/2019  ?  3:45 AM  ?CMP  ?Glucose 65 - 99 mg/dL 176   180   164    ?BUN 8 - 27 mg/dL 12   19   10     ?Creatinine 0.57 - 1.00 mg/dL 0.47   0.85   0.59    ?Sodium 134 - 144 mmol/L 139   140   131    ?Potassium 3.5 - 5.2 mmol/L 4.3   4.5   4.0    ?Chloride 96 - 106 mmol/L 101   104   98    ?CO2 20 - 29 mmol/L 26   25   23     ?Calcium 8.7 - 10.3 mg/dL 9.7   9.7   8.9    ?Total Protein 6.5 - 8.1 g/dL   6.9    ?Total Bilirubin 0.3 - 1.2 mg/dL   0.6    ?Alkaline Phos 38 - 126 U/L   69    ?AST 15 - 41 U/L   18    ?ALT 0 - 44 U/L   18    ? ? ?Lab Results   ?Component Value Date  ? WBC 11.3 (H) 05/10/2020  ? HGB 12.1 05/10/2020  ? HCT 37.2 05/10/2020  ? MCV 90 05/10/2020  ? PLT 341 05/10/2020  ? NEUTROABS 6.5 05/28/2019  ? ? ?ASSESSMENT & PLAN:  ?Malignant neoplasm of upper-outer quadrant of left breast in female, estrogen receptor positive ([place]) ?Left breast biopsy 10/01/2016:  12:30: IDC with DCIS involving underlying papillary lesion, grade 1, ER 100%, PR 90%, Ki-67 10%, HER-2 negative ratio 1.29; irregular 1.9 cm mass axilla negative, T1 CN 0 stage IA clinical stage ?Left lumpectomy 12/25/2016: IDC grade 1, 1.5 cm, low-grade DCIS, resection margins negative, 0/2 lymph nodes negative, ER 100%, PR 100%, HER-2 negative ratio 1.29, Ki-67 10% T1 cN0 stage IA  ?Oncotype DX score 2: 4% risk of recurrence ?  ?Adjuvant radiation therapy 01/30/2017- 02/21/2017 ?  ?Treatment plan: Anastrozole 1 mg by mouth daily ?5 years started 02/21/17 stopped 05/30/2020 for arthralgias switched to letrozole ? ?Letrozole toxicities: Joint aches and stiffness and muscle cramps. ?We discussed the pros and cons and decided to discontinue antiestrogen therapy at this time. ? ?  ?Surveillance: ?1. Breast Exam: 12/01/2021 benign, surgical scars ?2. Mammogram: 11/14/2021: Benign breast density category B ?  ?Return to clinic on an as-needed basis ? ? ? ?No orders of the defined types were placed in this encounter. ? ?The patient has a good understanding of the overall plan. she agrees with it. she will call with any problems that may develop before the next visit here. ?Total time spent: 30 mins including face to face time and time spent for planning, charting and co-ordination of care ? ? Harriette Ohara, MD ?12/01/21 ? ? ? I Gardiner Coins am scribing for Dr. Lindi Adie ? ?I have reviewed the above documentation for accuracy and completeness, and I agree with the above. ? ?

## 2021-11-27 ENCOUNTER — Other Ambulatory Visit: Payer: Self-pay | Admitting: Hematology and Oncology

## 2021-12-01 ENCOUNTER — Inpatient Hospital Stay: Payer: Medicare Other | Attending: Hematology and Oncology | Admitting: Hematology and Oncology

## 2021-12-01 ENCOUNTER — Other Ambulatory Visit: Payer: Self-pay

## 2021-12-01 ENCOUNTER — Other Ambulatory Visit: Payer: Self-pay | Admitting: Interventional Cardiology

## 2021-12-01 DIAGNOSIS — Z170 Estrogen receptor positive status [ER+]: Secondary | ICD-10-CM | POA: Diagnosis not present

## 2021-12-01 DIAGNOSIS — Z7984 Long term (current) use of oral hypoglycemic drugs: Secondary | ICD-10-CM | POA: Diagnosis not present

## 2021-12-01 DIAGNOSIS — M25569 Pain in unspecified knee: Secondary | ICD-10-CM | POA: Diagnosis not present

## 2021-12-01 DIAGNOSIS — Z923 Personal history of irradiation: Secondary | ICD-10-CM | POA: Diagnosis not present

## 2021-12-01 DIAGNOSIS — Z79899 Other long term (current) drug therapy: Secondary | ICD-10-CM | POA: Diagnosis not present

## 2021-12-01 DIAGNOSIS — C50412 Malignant neoplasm of upper-outer quadrant of left female breast: Secondary | ICD-10-CM | POA: Insufficient documentation

## 2021-12-01 DIAGNOSIS — Z79811 Long term (current) use of aromatase inhibitors: Secondary | ICD-10-CM | POA: Diagnosis not present

## 2021-12-01 NOTE — Assessment & Plan Note (Addendum)
Left breast biopsy?10/01/2016: 12:30: IDC with DCIS involving underlying papillary lesion, grade 1, ER 100%, PR 90%, Ki-67 10%, HER-2 negative ratio 1.29; irregular 1.9 cm mass axilla negative, T1 CN 0 stage IA clinical stage ?Left lumpectomy 12/25/2016: IDC grade 1, 1.5 cm, low-grade DCIS, resection margins negative, 0/2 lymph nodes negative, ER 100%, PR 100%, HER-2 negative ratio 1.29, Ki-67 10% T1 cN0 stage IA? ?Oncotype DX score 2: 4% risk of recurrence ?? ?Adjuvant radiation therapy 01/30/2017-?02/21/2017 ?? ?Treatment plan: Anastrozole 1 mg by mouth daily ?5 years?started 02/21/17?stopped 05/30/2020 for arthralgias switched to letrozole ? ?Letrozole toxicities: Joint aches and stiffness and muscle cramps. ?We discussed the pros and cons and decided to discontinue antiestrogen therapy at this time. ? ?? ?Surveillance: ?1. Breast Exam: 12/01/2021 benign, surgical scars ?2. Mammogram:?11/14/2021: Benign breast density category B ?? ?Return to clinic on an as-needed basis ?

## 2021-12-04 DIAGNOSIS — S8262XD Displaced fracture of lateral malleolus of left fibula, subsequent encounter for closed fracture with routine healing: Secondary | ICD-10-CM | POA: Diagnosis not present

## 2021-12-04 DIAGNOSIS — S92352D Displaced fracture of fifth metatarsal bone, left foot, subsequent encounter for fracture with routine healing: Secondary | ICD-10-CM | POA: Diagnosis not present

## 2021-12-07 ENCOUNTER — Other Ambulatory Visit: Payer: Self-pay

## 2021-12-07 MED ORDER — NITROGLYCERIN 0.4 MG SL SUBL: 0.4000 mg | SUBLINGUAL_TABLET | SUBLINGUAL | 1 refills | Status: DC | PRN

## 2021-12-12 DIAGNOSIS — S8262XD Displaced fracture of lateral malleolus of left fibula, subsequent encounter for closed fracture with routine healing: Secondary | ICD-10-CM | POA: Diagnosis not present

## 2021-12-12 DIAGNOSIS — S92352D Displaced fracture of fifth metatarsal bone, left foot, subsequent encounter for fracture with routine healing: Secondary | ICD-10-CM | POA: Diagnosis not present

## 2021-12-13 DIAGNOSIS — H524 Presbyopia: Secondary | ICD-10-CM | POA: Diagnosis not present

## 2021-12-14 DIAGNOSIS — S92352D Displaced fracture of fifth metatarsal bone, left foot, subsequent encounter for fracture with routine healing: Secondary | ICD-10-CM | POA: Diagnosis not present

## 2021-12-14 DIAGNOSIS — S8262XD Displaced fracture of lateral malleolus of left fibula, subsequent encounter for closed fracture with routine healing: Secondary | ICD-10-CM | POA: Diagnosis not present

## 2021-12-18 DIAGNOSIS — S92352D Displaced fracture of fifth metatarsal bone, left foot, subsequent encounter for fracture with routine healing: Secondary | ICD-10-CM | POA: Diagnosis not present

## 2021-12-18 DIAGNOSIS — S8262XD Displaced fracture of lateral malleolus of left fibula, subsequent encounter for closed fracture with routine healing: Secondary | ICD-10-CM | POA: Diagnosis not present

## 2021-12-21 DIAGNOSIS — S8262XD Displaced fracture of lateral malleolus of left fibula, subsequent encounter for closed fracture with routine healing: Secondary | ICD-10-CM | POA: Diagnosis not present

## 2021-12-21 DIAGNOSIS — S92352D Displaced fracture of fifth metatarsal bone, left foot, subsequent encounter for fracture with routine healing: Secondary | ICD-10-CM | POA: Diagnosis not present

## 2021-12-25 DIAGNOSIS — H524 Presbyopia: Secondary | ICD-10-CM | POA: Diagnosis not present

## 2021-12-26 DIAGNOSIS — S8262XD Displaced fracture of lateral malleolus of left fibula, subsequent encounter for closed fracture with routine healing: Secondary | ICD-10-CM | POA: Diagnosis not present

## 2021-12-26 DIAGNOSIS — S92352D Displaced fracture of fifth metatarsal bone, left foot, subsequent encounter for fracture with routine healing: Secondary | ICD-10-CM | POA: Diagnosis not present

## 2021-12-28 DIAGNOSIS — I10 Essential (primary) hypertension: Secondary | ICD-10-CM | POA: Diagnosis not present

## 2021-12-28 DIAGNOSIS — E1165 Type 2 diabetes mellitus with hyperglycemia: Secondary | ICD-10-CM | POA: Diagnosis not present

## 2021-12-28 DIAGNOSIS — I5032 Chronic diastolic (congestive) heart failure: Secondary | ICD-10-CM | POA: Diagnosis not present

## 2021-12-28 DIAGNOSIS — I25119 Atherosclerotic heart disease of native coronary artery with unspecified angina pectoris: Secondary | ICD-10-CM | POA: Diagnosis not present

## 2021-12-29 DIAGNOSIS — S92352D Displaced fracture of fifth metatarsal bone, left foot, subsequent encounter for fracture with routine healing: Secondary | ICD-10-CM | POA: Diagnosis not present

## 2021-12-29 DIAGNOSIS — S8262XD Displaced fracture of lateral malleolus of left fibula, subsequent encounter for closed fracture with routine healing: Secondary | ICD-10-CM | POA: Diagnosis not present

## 2022-01-01 DIAGNOSIS — S92352D Displaced fracture of fifth metatarsal bone, left foot, subsequent encounter for fracture with routine healing: Secondary | ICD-10-CM | POA: Diagnosis not present

## 2022-01-01 DIAGNOSIS — S8262XD Displaced fracture of lateral malleolus of left fibula, subsequent encounter for closed fracture with routine healing: Secondary | ICD-10-CM | POA: Diagnosis not present

## 2022-01-02 DIAGNOSIS — M545 Low back pain, unspecified: Secondary | ICD-10-CM | POA: Diagnosis not present

## 2022-01-02 DIAGNOSIS — M5136 Other intervertebral disc degeneration, lumbar region: Secondary | ICD-10-CM | POA: Diagnosis not present

## 2022-01-03 NOTE — Progress Notes (Signed)
Cardiology Office Note:    Date:  01/04/2022   ID:  Susan Barnett, DOB 02/11/1946, MRN [id number]  PCP:  Shirline Frees, MD  Cardiologist:  Sinclair Grooms, MD   Referring MD: Shirline Frees, MD   Chief Complaint  Patient presents with   Shortness of Breath   Congestive Heart Failure   Coronary Artery Disease    History of Present Illness:    Susan Barnett is a 76 y.o. female with a hx of CAD, recent CABG for repeat in-stent restenosis of right coronary, hypertension, diabetes mellitus II, hyperlipidemia, and obstructive sleep apnea.    She does have some dyspnea on exertion.  We determined that she had diastolic heart failure.  We attempted to use SGLT2 therapy but she developed recurrent urinary tract infection/yeast.  She now does with exertional dyspnea by just taking her time and resting when she has to.  She is not having angina.  No nitroglycerin use.  Past Medical History:  Diagnosis Date   Anemia    mild   Anginal pain (HCC)    with exertion, last time prior to cardiac caht 06/02/14   Anxiety    situational   Arthritis    Breast cancer ([place]) 2018   Left Breast Cancer   CAD (coronary artery disease)    Dr. Linard Millers follows, no problems now   Cancer [facility])    Breast cancer   Complication of anesthesia    Diabetes mellitus, type 2 ([place])    TYPE 2   Dysrhythmia    occ skips a beat, rate was fast before beta blocker.   Gastric polyp    Genetic testing 04/12/2017   Ms. Titsworth underwent genetic counseling and testing for hereditary cancer syndromes on 02/19/2017. Her results were negative for mutations in all 46 genes analyzed by [facility]'s 46-gene Common Hereditary Cancers Panel. Genes analyzed include: APC, ATM, AXIN2, BARD1, BMPR1A, BRCA1, BRCA2, BRIP1, CDH1, CDKN2A, CHEK2, CTNNA1, DICER1, EPCAM, GREM1, HOXB13, KIT, MEN1, MLH1, MSH2, MSH3, MSH6, MUTYH, NBN   GERD (gastroesophageal reflux disease)    Headache    hx of   Heartburn    Hiatal hernia    mild  head tremors   History of radiation therapy 01/30/17- 02/21/17   Left Breast 42.56 Gy in 16 fractions   Hypertension    Hypothyroidism    Occasional tremors    "of head", slight hands   Osteopenia    Personal history of radiation therapy 2018   Left Breast Cancer   Pneumonia    PONV (postoperative nausea and vomiting) 02/24/14   Shortness of breath    with exertion   Sleep apnea    cpap used nightly x 2 yrs now.    Past Surgical History:  Procedure Laterality Date   BREAST BIOPSY Left 1987   BREAST BIOPSY Left 2019   BREAST LUMPECTOMY Left 01/25/2017   BREAST LUMPECTOMY WITH RADIOACTIVE SEED AND SENTINEL LYMPH NODE BIOPSY Left 12/25/2016   Procedure: BREAST LUMPECTOMY WITH RADIOACTIVE SEED AND SENTINEL LYMPH NODE BIOPSY;  Surgeon: [name], MD;  Location: [place];  Service: General;  Laterality: Left;   CARDIAC CATHETERIZATION  06/02/2014   LAD 30%, D1 80%, CFX 755, RCA > 95% ISR, rx w/ PTCA, heavy calcification, EF 65%   CORONARY ARTERY BYPASS GRAFT N/A 06/14/2014   Procedure: CORONARY ARTERY BYPASS GRAFTING (CABG);  Surgeon: Gaye Pollack, MD;  Location: [place];  Service: Open Heart Surgery;  Laterality: N/A;  Times 2 using  endoscopically harvested right saphenous vein.   coronary stents     x3 stents placed-prior ablation   DILATION AND CURETTAGE OF UTERUS     ELBOW FRACTURE SURGERY Left    INCONTINENCE SURGERY     sling   KNEE ARTHROSCOPY Right 02/24/2014   Procedure: RIGHT KNEE ARTHROSCOPY WITH DEBRIDEMENT ;  Surgeon: Gearlean Alf, MD;  Location: [facility];  Service: Orthopedics;  Laterality: Right;   LEFT HEART CATHETERIZATION WITH CORONARY ANGIOGRAM N/A 06/02/2014   Procedure: LEFT HEART CATHETERIZATION WITH CORONARY ANGIOGRAM;  Surgeon: Sinclair Grooms, MD;  Location[facility] C[facility];  Service: Cardiovascular;  Laterality: N/A;   TEE WITHOUT CARDIOVERSION N/A 06/14/2014   Procedure: TRANSESOPHAGEAL ECHOCARDIOGRAM (TEE);  Surgeon: Gaye Pollack, MD;  Location: [place];   Service: Open Heart Surgery;  Laterality: N/A;    Current Medications: Current Meds  Medication Sig   Acetaminophen (ACETAMINOPHEN EXTRA STRENGTH) 500 MG capsule Take 2 tablets by mouth in the morning and at bedtime.   amLODipine (NORVASC) 5 MG tablet Take 5 mg by mouth daily.   atorvastatin (LIPITOR) 10 MG tablet Take 10 mg by mouth every evening.    CALCIUM PO Take 1 tablet by mouth in the morning and at bedtime.   cetirizine (ZYRTEC) 10 MG tablet Take 10 mg by mouth daily.    Cetirizine HCl (ZYRTEC ALLERGY) 10 MG CAPS Take 1 tablet by mouth daily.   cloNIDine (CATAPRES) 0.1 MG tablet TAKE ONE TABLET BY MOUTH EVERY MORNING and TAKE ONE TABLET BY MOUTH EVERY EVENING   clopidogrel (PLAVIX) 75 MG tablet Take 75 mg by mouth daily.   Coenzyme Q10 300 MG CAPS Take 300 mg by mouth 2 (two) times daily.   D-Mannose 500 MG CAPS See admin instructions.   diphenhydramine-acetaminophen (TYLENOL PM) 25-500 MG TABS tablet Take 1 tablet by mouth at bedtime as needed (For pain).    hydrochlorothiazide (MICROZIDE) 12.5 MG capsule Take 1 capsule by mouth on Monday, Wednesday and Friday.   LANTUS SOLOSTAR 100 UNIT/ML Solostar Pen Inject 40 Units into the skin at bedtime.   levothyroxine (SYNTHROID, LEVOTHROID) 75 MCG tablet Take 75 mcg by mouth daily before breakfast.    loratadine (CLARITIN) 10 MG tablet 1 tablet   losartan (COZAAR) 100 MG tablet Take 1 tablet (100 mg total) by mouth daily.   meclizine (ANTIVERT) 25 MG tablet as needed.   metoprolol tartrate (LOPRESSOR) 50 MG tablet TAKE TWO TABLETS BY MOUTH EVERY MORNING and TAKE TWO TABLETS BY MOUTH EVERY EVENING   miconazole (ANTIFUNGAL) 2 % powder as needed.   Multiple Vitamin (MULTIVITAMIN ADULT PO) Take by mouth.   nitroGLYCERIN (NITROSTAT) 0.4 MG SL tablet Place 1 tablet (0.4 mg total) under the tongue every 5 (five) minutes as needed for chest pain.   ONETOUCH VERIO test strip 1 each 2 (two) times daily.   pantoprazole (PROTONIX) 40 MG tablet Take  1 tablet (40 mg total) by mouth daily before breakfast.   Polyethyl Glycol-Propyl Glycol (SYSTANE OP) Place 1 drop into both eyes as needed.    predniSONE (DELTASONE) 5 MG tablet 5 mg as directed.   sitaGLIPtin-metformin (JANUMET) 50-1000 MG tablet Take 1 tablet by mouth 2 (two) times daily with a meal.     Allergies:   Asa [aspirin], Compazine [prochlorperazine edisylate], Nsaids, Shellfish allergy, Sulfa antibiotics, Zantac [ranitidine hcl], Lisinopril, and Zantac [ranitidine]   Social History   Socioeconomic History   Marital status: Married    Spouse name: Not on file  Number of children: Not on file   Years of education: Not on file   Highest education level: Not on file  Occupational History   Not on file  Tobacco Use   Smoking status: Never   Smokeless tobacco: Never  Vaping Use   Vaping Use: Never used  Substance and Sexual Activity   Alcohol use: No   Drug use: No   Sexual activity: Not Currently  Other Topics Concern   Not on file  Social History Narrative   Not on file   Social Determinants of Health   Financial Resource Strain: Not on file  Food Insecurity: Not on file  Transportation Needs: Not on file  Physical Activity: Not on file  Stress: Not on file  Social Connections: Not on file     Family History: The patient's family history includes Breast cancer (age of onset: 18) in her cousin; Diabetes in her mother; Healthy in her brother; Heart attack in her mother; Hodgkin's lymphoma in her maternal uncle; Hypertension in her mother; Lung cancer in her brother; Ulcerative colitis in her daughter.  ROS:   Please see the history of present illness.    Low back discomfort.  Recent tapering dose prednisone series.  No peripheral edema.  Denies orthopnea.  No nitroglycerin use.  All other systems reviewed and are negative.  EKGs/Labs/Other Studies Reviewed:    The following studies were reviewed today: 2020 2D Doppler echocardiogram Study Conclusions    - Left ventricle: The cavity size was normal. Wall thickness was    increased in a pattern of mild LVH. There was mild focal basal    hypertrophy of the septum. Systolic function was normal. The    estimated ejection fraction was in the range of 55% to 60%. Wall    motion was normal; there were no regional wall motion    abnormalities. Features are consistent with a pseudonormal left    ventricular filling pattern, with concomitant abnormal relaxation    and increased filling pressure (grade 2 diastolic dysfunction).    Doppler parameters are consistent with high ventricular filling    pressure.  - Aortic valve: There was trivial regurgitation.  - Mitral valve: Calcified annulus. There was mild regurgitation.  - Left atrium: The atrium was mildly dilated.  - Pulmonary arteries: Systolic pressure was mildly increased. PA    peak pressure: 32 mm Hg (S).   Impressions:   - Normal LV systolic function; moderate diastolic dysfunction; mild    LVH; trace AI; mild MR; mild LAE; trace TR with mild pulmonary    hypertension.  EKG:  EKG normal sinus rhythm with small inferior Q waves.  Otherwise unremarkable.  Unchanged from prior tracing done most recently in June 2022  Recent Labs: No results found for requested labs within last 8760 hours.  Recent Lipid Panel    Component Value Date/Time   CHOL  08/13/2007 0615    116        ATP III CLASSIFICATION:  <200     mg/dL   Desirable  200-239  mg/dL   Borderline High  >=240    mg/dL   High   TRIG 69 08/13/2007 0615   HDL 41 08/13/2007 0615   CHOLHDL 2.8 08/13/2007 0615   VLDL 14 08/13/2007 0615   LDLCALC  08/13/2007 0615    61        Total Cholesterol/HDL:CHD Risk Coronary Heart Disease Risk Table  Men   Women  1/2 Average Risk   3.4   3.3    Physical Exam:    VS:  BP 138/68   Pulse 81   Ht 5' 2"  (1.575 m)   Wt 155 lb 12.8 oz (70.7 kg)   SpO2 96%   BMI 28.50 kg/m     Wt Readings from Last 3 Encounters:   01/04/22 155 lb 12.8 oz (70.7 kg)  12/01/21 158 lb 6.4 oz (71.8 kg)  12/23/20 159 lb 3.2 oz (72.2 kg)     GEN: Overweight. No acute distress HEENT: Normal NECK: No JVD. LYMPHATICS: No lymphadenopathy CARDIAC: Apical systolic murmur. RRR S4 gallop, or edema. VASCULAR:  Normal Pulses. No bruits. RESPIRATORY:  Clear to auscultation without rales, wheezing or rhonchi  ABDOMEN: Soft, non-tender, non-distended, No pulsatile mass, MUSCULOSKELETAL: No deformity  SKIN: Warm and dry NEUROLOGIC:  Alert and oriented x 3 PSYCHIATRIC:  Normal affect   ASSESSMENT:    1. Coronary artery disease involving coronary bypass graft of native heart with angina pectoris ([place])   2. Essential hypertension   3. Mixed hyperlipidemia   4. OSA (obstructive sleep apnea)   5. Type 2 diabetes mellitus with complications ([place])   6. Diastolic dysfunction with chronic heart failure (HCC)    PLAN:    In order of problems listed above:  Secondary prevention covered. Blood pressure control is perfect.  She is running between 130-140/70 mmHg.  Continue losartan, Catapres, Lopressor, and amlodipine.  Also taking Microzide 12.5 mg/day. Continue Lipitor therapy.  LDL target less than 65. CPAP is being used and she is compliant. Unable to use SGLT2 due to recurrent yeast/urinary infections.   Continue low-dose diuretic therapy.  Overall education and awareness concerning secondary risk prevention was discussed in detail: LDL less than 70, hemoglobin A1c less than 7, blood pressure target less than 130/80 mmHg, >150 minutes of moderate aerobic activity per week, avoidance of smoking, weight control (via diet and exercise), and continued surveillance/management of/for obstructive sleep apnea.    Medication Adjustments/Labs and Tests Ordered: Current medicines are reviewed at length with the patient today.  Concerns regarding medicines are outlined above.  Orders Placed This Encounter  Procedures   EKG 12-Lead    No orders of the defined types were placed in this encounter.   Patient Instructions  Medication Instructions:  Your physician recommends that you continue on your current medications as directed. Please refer to the Current Medication list given to you today.  *If you need a refill on your cardiac medications before your next appointment, please call your pharmacy*  Lab Work: NONE  Testing/Procedures: NONE  Follow-Up: At [facility], you and your health needs are our priority.  As part of our continuing mission to provide you with exceptional heart care, we have created designated Provider Care Teams.  These Care Teams include your primary Cardiologist (physician) and Advanced Practice Providers (APPs -  Physician Assistants and Nurse Practitioners) who all work together to provide you with the care you need, when you need it.  Your next appointment:   1 year(s)  The format for your next appointment:   In Person  Provider:   Sinclair Grooms, MD {   Important Information About Sugar         Signed, Sinclair Grooms, MD  01/04/2022 5:13 PM    [place]

## 2022-01-04 ENCOUNTER — Encounter: Payer: Self-pay | Admitting: Interventional Cardiology

## 2022-01-04 ENCOUNTER — Ambulatory Visit: Payer: Medicare Other | Admitting: Interventional Cardiology

## 2022-01-04 VITALS — BP 138/68 | HR 81 | Ht 62.0 in | Wt 155.8 lb

## 2022-01-04 DIAGNOSIS — G4733 Obstructive sleep apnea (adult) (pediatric): Secondary | ICD-10-CM

## 2022-01-04 DIAGNOSIS — I10 Essential (primary) hypertension: Secondary | ICD-10-CM

## 2022-01-04 DIAGNOSIS — I25709 Atherosclerosis of coronary artery bypass graft(s), unspecified, with unspecified angina pectoris: Secondary | ICD-10-CM | POA: Diagnosis not present

## 2022-01-04 DIAGNOSIS — E782 Mixed hyperlipidemia: Secondary | ICD-10-CM

## 2022-01-04 DIAGNOSIS — I5032 Chronic diastolic (congestive) heart failure: Secondary | ICD-10-CM

## 2022-01-04 DIAGNOSIS — E118 Type 2 diabetes mellitus with unspecified complications: Secondary | ICD-10-CM

## 2022-01-04 NOTE — Patient Instructions (Signed)

## 2022-01-11 DIAGNOSIS — S8262XD Displaced fracture of lateral malleolus of left fibula, subsequent encounter for closed fracture with routine healing: Secondary | ICD-10-CM | POA: Diagnosis not present

## 2022-01-11 DIAGNOSIS — S92352D Displaced fracture of fifth metatarsal bone, left foot, subsequent encounter for fracture with routine healing: Secondary | ICD-10-CM | POA: Diagnosis not present

## 2022-01-15 DIAGNOSIS — M5451 Vertebrogenic low back pain: Secondary | ICD-10-CM | POA: Diagnosis not present

## 2022-01-16 DIAGNOSIS — S92352D Displaced fracture of fifth metatarsal bone, left foot, subsequent encounter for fracture with routine healing: Secondary | ICD-10-CM | POA: Diagnosis not present

## 2022-01-16 DIAGNOSIS — S8262XD Displaced fracture of lateral malleolus of left fibula, subsequent encounter for closed fracture with routine healing: Secondary | ICD-10-CM | POA: Diagnosis not present

## 2022-01-19 DIAGNOSIS — S8262XD Displaced fracture of lateral malleolus of left fibula, subsequent encounter for closed fracture with routine healing: Secondary | ICD-10-CM | POA: Diagnosis not present

## 2022-01-19 DIAGNOSIS — S92352D Displaced fracture of fifth metatarsal bone, left foot, subsequent encounter for fracture with routine healing: Secondary | ICD-10-CM | POA: Diagnosis not present

## 2022-01-23 DIAGNOSIS — M5136 Other intervertebral disc degeneration, lumbar region: Secondary | ICD-10-CM | POA: Diagnosis not present

## 2022-01-23 DIAGNOSIS — M5459 Other low back pain: Secondary | ICD-10-CM | POA: Diagnosis not present

## 2022-01-23 DIAGNOSIS — M545 Low back pain, unspecified: Secondary | ICD-10-CM | POA: Diagnosis not present

## 2022-01-24 DIAGNOSIS — M5451 Vertebrogenic low back pain: Secondary | ICD-10-CM | POA: Diagnosis not present

## 2022-01-26 DIAGNOSIS — S92352D Displaced fracture of fifth metatarsal bone, left foot, subsequent encounter for fracture with routine healing: Secondary | ICD-10-CM | POA: Diagnosis not present

## 2022-01-26 DIAGNOSIS — S8262XD Displaced fracture of lateral malleolus of left fibula, subsequent encounter for closed fracture with routine healing: Secondary | ICD-10-CM | POA: Diagnosis not present

## 2022-01-29 DIAGNOSIS — E0842 Diabetes mellitus due to underlying condition with diabetic polyneuropathy: Secondary | ICD-10-CM | POA: Diagnosis not present

## 2022-01-29 DIAGNOSIS — I10 Essential (primary) hypertension: Secondary | ICD-10-CM | POA: Diagnosis not present

## 2022-01-29 DIAGNOSIS — E7800 Pure hypercholesterolemia, unspecified: Secondary | ICD-10-CM | POA: Diagnosis not present

## 2022-01-29 DIAGNOSIS — E039 Hypothyroidism, unspecified: Secondary | ICD-10-CM | POA: Diagnosis not present

## 2022-01-29 DIAGNOSIS — M170 Bilateral primary osteoarthritis of knee: Secondary | ICD-10-CM | POA: Diagnosis not present

## 2022-01-31 DIAGNOSIS — M5451 Vertebrogenic low back pain: Secondary | ICD-10-CM | POA: Diagnosis not present

## 2022-02-01 DIAGNOSIS — M545 Low back pain, unspecified: Secondary | ICD-10-CM | POA: Diagnosis not present

## 2022-02-02 DIAGNOSIS — M5451 Vertebrogenic low back pain: Secondary | ICD-10-CM | POA: Diagnosis not present

## 2022-02-05 DIAGNOSIS — M170 Bilateral primary osteoarthritis of knee: Secondary | ICD-10-CM | POA: Diagnosis not present

## 2022-02-07 DIAGNOSIS — S92352D Displaced fracture of fifth metatarsal bone, left foot, subsequent encounter for fracture with routine healing: Secondary | ICD-10-CM | POA: Diagnosis not present

## 2022-02-07 DIAGNOSIS — S8262XD Displaced fracture of lateral malleolus of left fibula, subsequent encounter for closed fracture with routine healing: Secondary | ICD-10-CM | POA: Diagnosis not present

## 2022-02-09 DIAGNOSIS — M5451 Vertebrogenic low back pain: Secondary | ICD-10-CM | POA: Diagnosis not present

## 2022-02-12 DIAGNOSIS — M170 Bilateral primary osteoarthritis of knee: Secondary | ICD-10-CM | POA: Diagnosis not present

## 2022-02-14 DIAGNOSIS — M545 Low back pain, unspecified: Secondary | ICD-10-CM | POA: Diagnosis not present

## 2022-02-14 DIAGNOSIS — S8262XD Displaced fracture of lateral malleolus of left fibula, subsequent encounter for closed fracture with routine healing: Secondary | ICD-10-CM | POA: Diagnosis not present

## 2022-02-14 DIAGNOSIS — S92352D Displaced fracture of fifth metatarsal bone, left foot, subsequent encounter for fracture with routine healing: Secondary | ICD-10-CM | POA: Diagnosis not present

## 2022-02-16 DIAGNOSIS — M5451 Vertebrogenic low back pain: Secondary | ICD-10-CM | POA: Diagnosis not present

## 2022-02-23 DIAGNOSIS — M5451 Vertebrogenic low back pain: Secondary | ICD-10-CM | POA: Diagnosis not present

## 2022-02-26 DIAGNOSIS — I5032 Chronic diastolic (congestive) heart failure: Secondary | ICD-10-CM | POA: Diagnosis not present

## 2022-02-26 DIAGNOSIS — E7800 Pure hypercholesterolemia, unspecified: Secondary | ICD-10-CM | POA: Diagnosis not present

## 2022-02-26 DIAGNOSIS — I10 Essential (primary) hypertension: Secondary | ICD-10-CM | POA: Diagnosis not present

## 2022-02-26 DIAGNOSIS — E039 Hypothyroidism, unspecified: Secondary | ICD-10-CM | POA: Diagnosis not present

## 2022-03-02 DIAGNOSIS — M5451 Vertebrogenic low back pain: Secondary | ICD-10-CM | POA: Diagnosis not present

## 2022-03-05 ENCOUNTER — Other Ambulatory Visit: Payer: Self-pay | Admitting: Interventional Cardiology

## 2022-03-15 DIAGNOSIS — M47816 Spondylosis without myelopathy or radiculopathy, lumbar region: Secondary | ICD-10-CM | POA: Diagnosis not present

## 2022-03-16 DIAGNOSIS — M5451 Vertebrogenic low back pain: Secondary | ICD-10-CM | POA: Diagnosis not present

## 2022-03-21 DIAGNOSIS — E7800 Pure hypercholesterolemia, unspecified: Secondary | ICD-10-CM | POA: Diagnosis not present

## 2022-03-21 DIAGNOSIS — I10 Essential (primary) hypertension: Secondary | ICD-10-CM | POA: Diagnosis not present

## 2022-03-21 DIAGNOSIS — Z0000 Encounter for general adult medical examination without abnormal findings: Secondary | ICD-10-CM | POA: Diagnosis not present

## 2022-03-21 DIAGNOSIS — E039 Hypothyroidism, unspecified: Secondary | ICD-10-CM | POA: Diagnosis not present

## 2022-03-22 ENCOUNTER — Other Ambulatory Visit: Payer: Self-pay | Admitting: Family Medicine

## 2022-03-22 DIAGNOSIS — M810 Age-related osteoporosis without current pathological fracture: Secondary | ICD-10-CM

## 2022-03-22 DIAGNOSIS — M5451 Vertebrogenic low back pain: Secondary | ICD-10-CM | POA: Diagnosis not present

## 2022-03-27 DIAGNOSIS — I2510 Atherosclerotic heart disease of native coronary artery without angina pectoris: Secondary | ICD-10-CM | POA: Diagnosis not present

## 2022-03-27 DIAGNOSIS — I10 Essential (primary) hypertension: Secondary | ICD-10-CM | POA: Diagnosis not present

## 2022-03-27 DIAGNOSIS — E1142 Type 2 diabetes mellitus with diabetic polyneuropathy: Secondary | ICD-10-CM | POA: Diagnosis not present

## 2022-03-27 DIAGNOSIS — E7800 Pure hypercholesterolemia, unspecified: Secondary | ICD-10-CM | POA: Diagnosis not present

## 2022-03-27 DIAGNOSIS — G4733 Obstructive sleep apnea (adult) (pediatric): Secondary | ICD-10-CM | POA: Diagnosis not present

## 2022-03-27 DIAGNOSIS — E0842 Diabetes mellitus due to underlying condition with diabetic polyneuropathy: Secondary | ICD-10-CM | POA: Diagnosis not present

## 2022-03-27 DIAGNOSIS — E039 Hypothyroidism, unspecified: Secondary | ICD-10-CM | POA: Diagnosis not present

## 2022-03-30 DIAGNOSIS — M47816 Spondylosis without myelopathy or radiculopathy, lumbar region: Secondary | ICD-10-CM | POA: Diagnosis not present

## 2022-04-06 DIAGNOSIS — Z6828 Body mass index (BMI) 28.0-28.9, adult: Secondary | ICD-10-CM | POA: Diagnosis not present

## 2022-04-06 DIAGNOSIS — M4316 Spondylolisthesis, lumbar region: Secondary | ICD-10-CM | POA: Diagnosis not present

## 2022-04-27 ENCOUNTER — Telehealth: Payer: Self-pay | Admitting: *Deleted

## 2022-04-27 NOTE — Telephone Encounter (Signed)
   Pre-operative Risk Assessment    Patient Name: Susan Barnett  DOB: Oct 15, 1945 MRN: [id number]      Request for Surgical Clearance    Procedure:   L3-4 LUMBAR FUSION  Date of Surgery:  Clearance TBD                                 Surgeon:  Eustace Moore Surgeon's Group or Practice Name:  [facility] Phone number:  [phone]  Fax number:  [phone]   Type of Clearance Requested:   - Medical    Type of Anesthesia:  General    Additional requests/questions:    Astrid Divine   04/27/2022, 7:39 AM  \

## 2022-04-27 NOTE — Telephone Encounter (Signed)
Primary Cardiologist:Henry Carlye Grippe, MD   Preoperative team, please contact this patient and set up a phone call appointment for further preoperative risk assessment. Please obtain consent and complete medication review. Thank you for your help.   I confirm that guidance regarding antiplatelet and oral anticoagulation therapy has been completed and, if necessary, noted below (none requested).   Emmaline Life, NP-C  04/27/2022, 4:40 PM [address] Office [phone] Fax [phone]

## 2022-05-01 NOTE — Telephone Encounter (Signed)
Left a message for the pt to call back for a tele pre op appt 

## 2022-05-02 ENCOUNTER — Telehealth: Payer: Self-pay | Admitting: *Deleted

## 2022-05-02 NOTE — Telephone Encounter (Signed)
Pt agreeable to plan of care for tele pre op appt 05/08/22 @ 9:40. Med rec and consent are done.      Patient Consent for Virtual Visit        Susan Barnett has provided verbal consent on 05/02/2022 for a virtual visit (video or telephone).   CONSENT FOR VIRTUAL VISIT FOR:  Susan Barnett  By participating in this virtual visit I agree to the following:  I hereby voluntarily request, consent and authorize [facility] and its employed or contracted physicians, physician assistants, nurse practitioners or other licensed health care professionals (the Practitioner), to provide me with telemedicine health care services (the "Services") as deemed necessary by the treating Practitioner. I acknowledge and consent to receive the Services by the Practitioner via telemedicine. I understand that the telemedicine visit will involve communicating with the Practitioner through live audiovisual communication technology and the disclosure of certain medical information by electronic transmission. I acknowledge that I have been given the opportunity to request an in-person assessment or other available alternative prior to the telemedicine visit and am voluntarily participating in the telemedicine visit.  I understand that I have the right to withhold or withdraw my consent to the use of telemedicine in the course of my care at any time, without affecting my right to future care or treatment, and that the Practitioner or I may terminate the telemedicine visit at any time. I understand that I have the right to inspect all information obtained and/or recorded in the course of the telemedicine visit and may receive copies of available information for a reasonable fee.  I understand that some of the potential risks of receiving the Services via telemedicine include:  Delay or interruption in medical evaluation due to technological equipment failure or disruption; Information transmitted may not be  sufficient (e.g. poor resolution of images) to allow for appropriate medical decision making by the Practitioner; and/or  In rare instances, security protocols could fail, causing a breach of personal health information.  Furthermore, I acknowledge that it is my responsibility to provide information about my medical history, conditions and care that is complete and accurate to the best of my ability. I acknowledge that Practitioner's advice, recommendations, and/or decision may be based on factors not within their control, such as incomplete or inaccurate data provided by me or distortions of diagnostic images or specimens that may result from electronic transmissions. I understand that the practice of medicine is not an exact science and that Practitioner makes no warranties or guarantees regarding treatment outcomes. I acknowledge that a copy of this consent can be made available to me via my patient portal ([facility]), or I can request a printed copy by calling the office of [facility].    I understand that my insurance will be billed for this visit.   I have read or had this consent read to me. I understand the contents of this consent, which adequately explains the benefits and risks of the Services being provided via telemedicine.  I have been provided ample opportunity to ask questions regarding this consent and the Services and have had my questions answered to my satisfaction. I give my informed consent for the services to be provided through the use of telemedicine in my medical care

## 2022-05-02 NOTE — Telephone Encounter (Signed)
Pt agreeable to plan of care for tele pre op appt 05/08/22 @ 9:40. Med rec and consent are done.

## 2022-05-07 DIAGNOSIS — I10 Essential (primary) hypertension: Secondary | ICD-10-CM | POA: Diagnosis not present

## 2022-05-07 DIAGNOSIS — E0842 Diabetes mellitus due to underlying condition with diabetic polyneuropathy: Secondary | ICD-10-CM | POA: Diagnosis not present

## 2022-05-07 DIAGNOSIS — K219 Gastro-esophageal reflux disease without esophagitis: Secondary | ICD-10-CM | POA: Diagnosis not present

## 2022-05-07 DIAGNOSIS — E7800 Pure hypercholesterolemia, unspecified: Secondary | ICD-10-CM | POA: Diagnosis not present

## 2022-05-08 ENCOUNTER — Ambulatory Visit (INDEPENDENT_AMBULATORY_CARE_PROVIDER_SITE_OTHER): Payer: Medicare Other | Admitting: Family

## 2022-05-08 ENCOUNTER — Encounter (HOSPITAL_BASED_OUTPATIENT_CLINIC_OR_DEPARTMENT_OTHER): Payer: Self-pay | Admitting: Family

## 2022-05-08 VITALS — BP 136/80 | Ht 62.0 in

## 2022-05-08 DIAGNOSIS — Z01810 Encounter for preprocedural cardiovascular examination: Secondary | ICD-10-CM | POA: Diagnosis not present

## 2022-05-08 DIAGNOSIS — I25118 Atherosclerotic heart disease of native coronary artery with other forms of angina pectoris: Secondary | ICD-10-CM

## 2022-05-08 DIAGNOSIS — I119 Hypertensive heart disease without heart failure: Secondary | ICD-10-CM

## 2022-05-08 DIAGNOSIS — E785 Hyperlipidemia, unspecified: Secondary | ICD-10-CM

## 2022-05-08 DIAGNOSIS — I10 Essential (primary) hypertension: Secondary | ICD-10-CM

## 2022-05-08 NOTE — Progress Notes (Signed)
Virtual Visit via Telephone Note   Because of Analese Sovine Fleishman's co-morbid illnesses, she is at least at moderate risk for complications without adequate follow up.  This format is felt to be most appropriate for this patient at this time.  The patient did not have access to video technology/had technical difficulties with video requiring transitioning to audio format only (telephone).  All issues noted in this document were discussed and addressed.  No physical exam could be performed with this format.  Please refer to the patient's chart for her consent to telehealth for [facility].    Date:  05/08/2022   ID:  Susan Barnett, DOB [birth year], MRN [id number] The patient was identified using 2 identifiers.  Patient Location: Home Provider Location: Home Office   PCP:  Shirline Frees, [place] [facility] Cardiologist:  Sinclair Grooms, MD Sleep Medicine:  Fransico Him, MD     Evaluation Performed:  Follow-Up Visit  Chief Complaint:  Preop clearance  History of Present Illness:    Susan Barnett is a 76 y.o. female with CAD s/p CABG 2015 for in-stent restenosis of right coronary, hypertension, DM 2, hyperlipidemia, OSA.  She was last seen 01/04/2022 by Dr. Tamala Julian.  At last visit she noted some exertional dyspnea.  Previously attempted WHQP5F for diastolic heart failure but developed recurrent UTI/yeast infection.  Her exertional dyspnea was stable and resolved with rest.  Presents today for preop clearance for L2-L3 fusion. Tells me she gets out of breath with activity. This has been ongoing for some time and is stable. It is resolved by rest.  It is different than her prior angina.She is ambulating around her home by herself with a [employer]ine. She is able to take her 3 stairs to get into the home and complete housework.  Denies chest pain, lightheadedness, orthopnea, PND.  She has mild lower extremity edema which is resolved by morning. Tells me she is still  not sure if she wants to have the surgery and is still considering.    Past Medical History:  Diagnosis Date   Anemia    mild   Anginal pain (HCC)    with exertion, last time prior to cardiac caht 06/02/14   Anxiety    situational   Arthritis    Breast cancer ([place]) 2018   Left Breast Cancer   CAD (coronary artery disease)    Dr. Linard Millers follows, no problems now   Cancer [facility])    Breast cancer   Complication of anesthesia    Diabetes mellitus, type 2 ([place])    TYPE 2   Dysrhythmia    occ skips a beat, rate was fast before beta blocker.   Gastric polyp    Genetic testing 04/12/2017   Ms. Riehl underwent genetic counseling and testing for hereditary cancer syndromes on 02/19/2017. Her results were negative for mutations in all 46 genes analyzed by [facility]'s 46-gene Common Hereditary Cancers Panel. Genes analyzed include: APC, ATM, AXIN2, BARD1, BMPR1A, BRCA1, BRCA2, BRIP1, CDH1, CDKN2A, CHEK2, CTNNA1, DICER1, EPCAM, GREM1, HOXB13, KIT, MEN1, MLH1, MSH2, MSH3, MSH6, MUTYH, NBN   GERD (gastroesophageal reflux disease)    Headache    hx of   Heartburn    Hiatal hernia    mild head tremors   History of radiation therapy 01/30/17- 02/21/17   Left Breast 42.56 Gy in 16 fractions   Hypertension    Hypothyroidism    Occasional tremors    "of head", slight  hands   Osteopenia    Personal history of radiation therapy 2018   Left Breast Cancer   Pneumonia    PONV (postoperative nausea and vomiting) 02/24/14   Shortness of breath    with exertion   Sleep apnea    cpap used nightly x 2 yrs now.   Past Surgical History:  Procedure Laterality Date   BREAST BIOPSY Left 1987   BREAST BIOPSY Left 2019   BREAST LUMPECTOMY Left 01/25/2017   BREAST LUMPECTOMY WITH RADIOACTIVE SEED AND SENTINEL LYMPH NODE BIOPSY Left 12/25/2016   Procedure: BREAST LUMPECTOMY WITH RADIOACTIVE SEED AND SENTINEL LYMPH NODE BIOPSY;  Surgeon: [name], MD;  Location: [facility];  Service: General;   Laterality: Left;   CARDIAC CATHETERIZATION  06/02/2014   LAD 30%, D1 80%, CFX 755, RCA > 95% ISR, rx w/ PTCA, heavy calcification, EF 65%   CORONARY ARTERY BYPASS GRAFT N/A 06/14/2014   Procedure: CORONARY ARTERY BYPASS GRAFTING (CABG);  Surgeon: Gaye Pollack, MD;  Location: [facility];  Service: Open Heart Surgery;  Laterality: N/A;  Times 2 using endoscopically harvested right saphenous vein.   coronary stents     x3 stents placed-prior ablation   DILATION AND CURETTAGE OF UTERUS     ELBOW FRACTURE SURGERY Left    INCONTINENCE SURGERY     sling   KNEE ARTHROSCOPY Right 02/24/2014   Procedure: RIGHT KNEE ARTHROSCOPY WITH DEBRIDEMENT ;  Surgeon: Gearlean Alf, MD;  Location: [facility];  Service: Orthopedics;  Laterality: Right;   LEFT HEART CATHETERIZATION WITH CORONARY ANGIOGRAM N/A 06/02/2014   Procedure: LEFT HEART CATHETERIZATION WITH CORONARY ANGIOGRAM;  Surgeon: Sinclair Grooms, MD;  Location: [facility] CATH LAB;  Service: Cardiovascular;  Laterality: N/A;   TEE WITHOUT CARDIOVERSION N/A 06/14/2014   Procedure: TRANSESOPHAGEAL ECHOCARDIOGRAM (TEE);  Surgeon: Gaye Pollack, MD;  Location: [facility];  Service: Open Heart Surgery;  Laterality: N/A;     Current Meds  Medication Sig   Acetaminophen (ACETAMINOPHEN EXTRA STRENGTH) 500 MG capsule Take 2 tablets by mouth in the morning and at bedtime.   amLODipine (NORVASC) 5 MG tablet Take 5 mg by mouth daily.   atorvastatin (LIPITOR) 10 MG tablet Take 10 mg by mouth every evening.    CALCIUM PO Take 1 tablet by mouth in the morning and at bedtime.   cetirizine (ZYRTEC) 10 MG tablet Take 10 mg by mouth daily.    Cetirizine HCl (ZYRTEC ALLERGY) 10 MG CAPS Take 1 tablet by mouth daily.   cloNIDine (CATAPRES) 0.1 MG tablet TAKE ONE TABLET BY MOUTH EVERY MORNING and TAKE ONE TABLET BY MOUTH EVERY EVENING   clopidogrel (PLAVIX) 75 MG tablet Take 75 mg by mouth daily.   Coenzyme Q10 300 MG CAPS Take 300 mg by mouth 2 (two) times daily.   D-Mannose 500 MG  CAPS See admin instructions.   diphenhydramine-acetaminophen (TYLENOL PM) 25-500 MG TABS tablet Take 1 tablet by mouth at bedtime as needed (For pain).    hydrochlorothiazide (MICROZIDE) 12.5 MG capsule Take 1 capsule by mouth on monday, wednesday, and friday   LANTUS SOLOSTAR 100 UNIT/ML Solostar Pen Inject 40 Units into the skin at bedtime.   levothyroxine (SYNTHROID, LEVOTHROID) 75 MCG tablet Take 75 mcg by mouth daily before breakfast.    loratadine (CLARITIN) 10 MG tablet 1 tablet   losartan (COZAAR) 100 MG tablet Take 1 tablet (100 mg total) by mouth daily.   meclizine (ANTIVERT) 25 MG tablet as needed.   metoprolol tartrate (LOPRESSOR)  50 MG tablet TAKE TWO TABLETS BY MOUTH EVERY MORNING and TAKE TWO TABLETS BY MOUTH EVERY EVENING   miconazole (ANTIFUNGAL) 2 % powder as needed.   Multiple Vitamin (MULTIVITAMIN ADULT PO) Take by mouth.   ONETOUCH VERIO test strip 1 each 2 (two) times daily.   pantoprazole (PROTONIX) 40 MG tablet Take 1 tablet (40 mg total) by mouth daily before breakfast.   Polyethyl Glycol-Propyl Glycol (SYSTANE OP) Place 1 drop into both eyes as needed.    predniSONE (DELTASONE) 5 MG tablet 5 mg as directed.   sitaGLIPtin-metformin (JANUMET) 50-1000 MG tablet Take 1 tablet by mouth 2 (two) times daily with a meal.     Allergies:   Asa [aspirin], Compazine [prochlorperazine edisylate], Nsaids, Shellfish allergy, Sulfa antibiotics, Zantac [ranitidine hcl], Lisinopril, and Zantac [ranitidine]   Social History   Tobacco Use   Smoking status: Never   Smokeless tobacco: Never  Vaping Use   Vaping Use: Never used  Substance Use Topics   Alcohol use: No   Drug use: No     Family Hx: The patient's family history includes Breast cancer (age of onset: 48) in her cousin; Diabetes in her mother; Healthy in her brother; Heart attack in her mother; Hodgkin's lymphoma in her maternal uncle; Hypertension in her mother; Lung cancer in her brother; Ulcerative colitis in her  daughter.  ROS:   Please see the history of present illness.     All other systems reviewed and are negative.   Prior CV studies:   The following studies were reviewed today:  Echo 05/18/20 Nuclear stress EF: 72%. No wall motion abnormalities. There was no ST segment deviation noted during stress. The study is normal. This is a low risk study. No evidence of ischemia or infarction. Prior CABG.  Labs/Other Tests and Data Reviewed:    EKG:  No ECG reviewed.  Recent Labs: No results found for requested labs within last 365 days.   Recent Lipid Panel Lab Results  Component Value Date/Time   CHOL  08/13/2007 06:15 AM    116        ATP III CLASSIFICATION:  <200     mg/dL   Desirable  200-239  mg/dL   Borderline High  >=240    mg/dL   High   TRIG 69 08/13/2007 06:15 AM   HDL 41 08/13/2007 06:15 AM   CHOLHDL 2.8 08/13/2007 06:15 AM   LDLCALC  08/13/2007 06:15 AM    61        Total Cholesterol/HDL:CHD Risk Coronary Heart Disease Risk Table                     Men   Women  1/2 Average Risk   3.4   3.3    Wt Readings from Last 3 Encounters:  01/04/22 155 lb 12.8 oz (70.7 kg)  12/01/21 158 lb 6.4 oz (71.8 kg)  12/23/20 159 lb 3.2 oz (72.2 kg)          Objective:    Vital Signs:  BP 136/80   Ht 5' 2"  (1.575 m)   BMI 28.50 kg/m    VITAL SIGNS:  reviewed  ASSESSMENT & PLAN:    Preop clearance -upcoming lumbar fusion.  She tells me she is still considering procedure. May hold Plavix 5 days prior to planned procedure per office protocols.  Resume as soon as deemed safe postoperatively by surgical team. According to the Revised Cardiac Risk Index (RCRI), her Perioperative Risk of Major Cardiac  Event is (%): 6.6.Her Functional Capacity in METs is: 4.3 according to the [employer] Activity Status Index (DASI).  She is deemed acceptable risk for the planned procedure without additional cardiovascular testing.  She was educated to contact our office if she has new or worsening  symptoms prior to procedure.  Will route to surgical team so they are aware via epic fax function.  CAD -no chest pain, pressure, tightness.  Her exertional dyspnea is stable and more consistent with her diastolic dysfunction and deconditioning.  GDMT includes atorvastatin, Plavix. Heart healthy diet and regular cardiovascular exercise encouraged.    HLD, LDL goal <70 - Continue Atorvastatin 47m daily. Denies myalgias.  HTN - BP well controlled per home readings per her report.  Continue current antihypertensive regimen.              Time:   Today, I have spent 13 minutes with the patient with telehealth technology discussing the above problems.     Medication Adjustments/Labs and Tests Ordered: Current medicines are reviewed at length with the patient today.  Concerns regarding medicines are outlined above.   Tests Ordered: No orders of the defined types were placed in this encounter.   Medication Changes: No orders of the defined types were placed in this encounter.   Follow Up:  In Person  12/2022 with Dr. STamala Julian Signed, CLoel Dubonnet NP  05/08/2022 9:57 AM    [place]

## 2022-05-24 ENCOUNTER — Other Ambulatory Visit: Payer: Self-pay | Admitting: Interventional Cardiology

## 2022-05-24 DIAGNOSIS — I10 Essential (primary) hypertension: Secondary | ICD-10-CM

## 2022-05-29 DIAGNOSIS — I10 Essential (primary) hypertension: Secondary | ICD-10-CM | POA: Diagnosis not present

## 2022-05-29 DIAGNOSIS — E1165 Type 2 diabetes mellitus with hyperglycemia: Secondary | ICD-10-CM | POA: Diagnosis not present

## 2022-05-29 DIAGNOSIS — E7800 Pure hypercholesterolemia, unspecified: Secondary | ICD-10-CM | POA: Diagnosis not present

## 2022-06-05 ENCOUNTER — Encounter: Payer: Self-pay | Admitting: Internal Medicine

## 2022-07-18 ENCOUNTER — Ambulatory Visit: Payer: Medicare Other | Admitting: Physical Therapy

## 2022-07-23 ENCOUNTER — Ambulatory Visit: Payer: Medicare Other

## 2022-07-26 DIAGNOSIS — E1142 Type 2 diabetes mellitus with diabetic polyneuropathy: Secondary | ICD-10-CM | POA: Diagnosis not present

## 2022-07-26 DIAGNOSIS — G4733 Obstructive sleep apnea (adult) (pediatric): Secondary | ICD-10-CM | POA: Diagnosis not present

## 2022-07-26 DIAGNOSIS — I739 Peripheral vascular disease, unspecified: Secondary | ICD-10-CM | POA: Diagnosis not present

## 2022-07-26 DIAGNOSIS — I10 Essential (primary) hypertension: Secondary | ICD-10-CM | POA: Diagnosis not present

## 2022-08-24 DIAGNOSIS — I10 Essential (primary) hypertension: Secondary | ICD-10-CM | POA: Diagnosis not present

## 2022-08-24 DIAGNOSIS — E0842 Diabetes mellitus due to underlying condition with diabetic polyneuropathy: Secondary | ICD-10-CM | POA: Diagnosis not present

## 2022-08-24 DIAGNOSIS — E785 Hyperlipidemia, unspecified: Secondary | ICD-10-CM | POA: Diagnosis not present

## 2022-08-24 DIAGNOSIS — I25119 Atherosclerotic heart disease of native coronary artery with unspecified angina pectoris: Secondary | ICD-10-CM | POA: Diagnosis not present

## 2022-09-10 ENCOUNTER — Encounter: Payer: Self-pay | Admitting: Hematology and Oncology

## 2022-09-17 ENCOUNTER — Ambulatory Visit
Admission: RE | Admit: 2022-09-17 | Discharge: 2022-09-17 | Disposition: A | Discharge status: Home / Self Care | Payer: Medicare Other | Source: Ambulatory Visit | Attending: Family Medicine | Admitting: Family Medicine

## 2022-09-17 DIAGNOSIS — M810 Age-related osteoporosis without current pathological fracture: Secondary | ICD-10-CM | POA: Diagnosis not present

## 2022-09-17 DIAGNOSIS — M85851 Other specified disorders of bone density and structure, right thigh: Secondary | ICD-10-CM | POA: Diagnosis not present

## 2022-09-17 DIAGNOSIS — Z780 Asymptomatic menopausal state: Secondary | ICD-10-CM | POA: Diagnosis not present

## 2022-09-26 DIAGNOSIS — E039 Hypothyroidism, unspecified: Secondary | ICD-10-CM | POA: Diagnosis not present

## 2022-09-26 DIAGNOSIS — E0842 Diabetes mellitus due to underlying condition with diabetic polyneuropathy: Secondary | ICD-10-CM | POA: Diagnosis not present

## 2022-09-26 DIAGNOSIS — E785 Hyperlipidemia, unspecified: Secondary | ICD-10-CM | POA: Diagnosis not present

## 2022-09-26 DIAGNOSIS — I10 Essential (primary) hypertension: Secondary | ICD-10-CM | POA: Diagnosis not present

## 2022-11-26 ENCOUNTER — Other Ambulatory Visit: Payer: Self-pay | Admitting: Family Medicine

## 2022-11-26 DIAGNOSIS — Z1231 Encounter for screening mammogram for malignant neoplasm of breast: Secondary | ICD-10-CM

## 2022-11-29 ENCOUNTER — Encounter: Payer: Self-pay | Admitting: Hematology and Oncology

## 2022-11-29 DIAGNOSIS — Z794 Long term (current) use of insulin: Secondary | ICD-10-CM | POA: Diagnosis not present

## 2022-11-29 DIAGNOSIS — E1142 Type 2 diabetes mellitus with diabetic polyneuropathy: Secondary | ICD-10-CM | POA: Diagnosis not present

## 2022-11-29 DIAGNOSIS — I739 Peripheral vascular disease, unspecified: Secondary | ICD-10-CM | POA: Diagnosis not present

## 2022-11-29 DIAGNOSIS — I119 Hypertensive heart disease without heart failure: Secondary | ICD-10-CM | POA: Diagnosis not present

## 2022-11-30 ENCOUNTER — Ambulatory Visit: Payer: Medicare Other | Admitting: Cardiology

## 2022-11-30 ENCOUNTER — Encounter: Payer: Self-pay | Admitting: Cardiology

## 2022-11-30 VITALS — BP 173/80 | HR 78 | Resp 16 | Ht 62.0 in | Wt 155.0 lb

## 2022-11-30 DIAGNOSIS — E785 Hyperlipidemia, unspecified: Secondary | ICD-10-CM

## 2022-11-30 DIAGNOSIS — E118 Type 2 diabetes mellitus with unspecified complications: Secondary | ICD-10-CM

## 2022-11-30 DIAGNOSIS — Z951 Presence of aortocoronary bypass graft: Secondary | ICD-10-CM

## 2022-11-30 DIAGNOSIS — E782 Mixed hyperlipidemia: Secondary | ICD-10-CM

## 2022-11-30 DIAGNOSIS — R072 Precordial pain: Secondary | ICD-10-CM

## 2022-11-30 DIAGNOSIS — I25118 Atherosclerotic heart disease of native coronary artery with other forms of angina pectoris: Secondary | ICD-10-CM

## 2022-11-30 DIAGNOSIS — I10 Essential (primary) hypertension: Secondary | ICD-10-CM

## 2022-11-30 DIAGNOSIS — G4733 Obstructive sleep apnea (adult) (pediatric): Secondary | ICD-10-CM

## 2022-11-30 MED ORDER — HYDROCHLOROTHIAZIDE 25 MG PO TABS
25.0000 mg | ORAL_TABLET | Freq: Every morning | ORAL | 0 refills | Status: DC
Start: 2022-11-30 — End: 2022-12-05

## 2022-11-30 MED ORDER — NITROGLYCERIN 0.4 MG SL SUBL: 0.4000 mg | SUBLINGUAL_TABLET | SUBLINGUAL | 1 refills | Status: DC | PRN

## 2022-11-30 MED ORDER — ISOSORBIDE MONONITRATE ER 30 MG PO TB24
30.0000 mg | ORAL_TABLET | Freq: Every day | ORAL | 0 refills | Status: DC
Start: 2022-11-30 — End: 2022-12-11

## 2022-11-30 NOTE — Progress Notes (Unsigned)
ID:  Susan Barnett, DOB 19-Mar-1946, MRN [id number]  PCP:  Johny Blamer, MD  Cardiologist:  Tessa Lerner, DO, [facility] (established care 11/30/2022) Former Cardiology Providers: Dr. Verdis Prime Sleep medicine provider: Dr. Carolanne Grumbling  REASON FOR CONSULT: Chest pain  REQUESTING PHYSICIAN:  Johny Blamer, MD [address] [place],  [place] [address]  Chief Complaint  Patient presents with   Chest Pain   New Patient (Initial Visit)   Dizziness    HPI  Susan Barnett is a 77 y.o. Caucasian female who presents to the clinic for evaluation of chest pain at the request of Johny Blamer, MD. Her past medical history and cardiovascular risk factors include: CAD status post CABG 2015 (SVG to diagonal and SVG to PDA), hypertension, diabetes mellitus type 2, hyperlipidemia, OSA  Patient presents to the office for evaluation of chest pain.  She is accompanied by her friend Jola Babinski at today's visit.  She provides verbal consent with regards to discussing her medical information in her presence.  She recently saw Dr. Evlyn Kanner for diabetes management and voiced concerns for substernal chest pain.  She did not have an appointment with [facility] heart care until May 2024 and therefore was referred to the practice for further evaluation and management.  A very pleasant 77 year old female who states that she has substernal discomfort, ongoing for the last 4 to 5 days, intermittent, radiates up to the throat/jaw, at times brought on by effort related activities and also at rest.  She chose not to take night nitroglycerin tablets due to prior headaches.  And her last chest pain was this morning.  For reasons unknown she did not seek medical attention by going to the closest ER via EMS for further evaluation and management.   ALLERGIES: Allergies  Allergen Reactions   Asa [Aspirin] Anaphylaxis   Compazine [Prochlorperazine Edisylate] Swelling and Other (See Comments)    Tongue swelling     Nsaids Hives and Swelling   Shellfish Allergy Hives and Swelling   Sulfa Antibiotics Hives and Swelling   Zantac [Ranitidine Hcl] Hives and Swelling   Lisinopril Cough    MEDICATION LIST PRIOR TO VISIT: Current Meds  Medication Sig   amLODipine (NORVASC) 5 MG tablet Take 5 mg by mouth daily.   atorvastatin (LIPITOR) 10 MG tablet Take 10 mg by mouth every evening.    CALCIUM PO Take 1 tablet by mouth in the morning and at bedtime.   Cetirizine HCl (ZYRTEC ALLERGY) 10 MG CAPS Take 1 tablet by mouth daily.   cloNIDine (CATAPRES) 0.1 MG tablet TAKE ONE TABLET BY MOUTH EVERY MORNING and TAKE ONE TABLET BY MOUTH EVERY EVENING   clopidogrel (PLAVIX) 75 MG tablet Take 75 mg by mouth daily.   Coenzyme Q10 300 MG CAPS Take 300 mg by mouth 2 (two) times daily.   D-Mannose 500 MG CAPS Take 500 mg by mouth in the morning and at bedtime.   hydrochlorothiazide (HYDRODIURIL) 25 MG tablet Take 1 tablet (25 mg total) by mouth every morning.   isosorbide mononitrate (IMDUR) 30 MG 24 hr tablet Take 1 tablet (30 mg total) by mouth daily at 10 pm.   LANTUS SOLOSTAR 100 UNIT/ML Solostar Pen Inject 40 Units into the skin at bedtime. (Patient taking differently: Inject 42 Units into the skin at bedtime.)   levothyroxine (SYNTHROID, LEVOTHROID) 75 MCG tablet Take 75 mcg by mouth daily before breakfast.    losartan (COZAAR) 100 MG tablet Take 1 tablet (100 mg total) by  mouth daily.   metoprolol tartrate (LOPRESSOR) 50 MG tablet TAKE TWO TABLETS BY MOUTH EVERY MORNING and TAKE TWO TABLETS BY MOUTH EVERY EVENING   miconazole (ANTIFUNGAL) 2 % powder Apply 1 Application topically as needed for itching.   Multiple Vitamin (MULTIVITAMIN ADULT PO) Take 1 tablet by mouth daily.   ONETOUCH VERIO test strip 1 each 2 (two) times daily.   pantoprazole (PROTONIX) 40 MG tablet Take 1 tablet (40 mg total) by mouth daily before breakfast.   sitaGLIPtin-metformin (JANUMET) 50-1000 MG tablet Take 1 tablet by mouth 2 (two) times  daily with a meal.   [DISCONTINUED] Acetaminophen (ACETAMINOPHEN EXTRA STRENGTH) 500 MG capsule Take 1,000 mg by mouth in the morning and at bedtime.   [DISCONTINUED] cetirizine (ZYRTEC) 10 MG tablet Take 10 mg by mouth daily.    [DISCONTINUED] diphenhydramine-acetaminophen (TYLENOL PM) 25-500 MG TABS tablet Take 1 tablet by mouth at bedtime as needed (For pain).    [DISCONTINUED] hydrochlorothiazide (MICROZIDE) 12.5 MG capsule Take 1 capsule by mouth on monday, wednesday, and friday   [DISCONTINUED] loratadine (CLARITIN) 10 MG tablet 10 mg.   [DISCONTINUED] nitroGLYCERIN (NITROSTAT) 0.4 MG SL tablet Place 1 tablet (0.4 mg total) under the tongue every 5 (five) minutes as needed for chest pain.   [DISCONTINUED] Polyethyl Glycol-Propyl Glycol (SYSTANE OP) Place 1 drop into both eyes as needed.    [DISCONTINUED] predniSONE (DELTASONE) 5 MG tablet 5 mg as directed.     PAST MEDICAL HISTORY: Past Medical History:  Diagnosis Date   Anemia    mild   Anginal pain    with exertion, last time prior to cardiac caht 06/02/14   Anxiety    situational   Arthritis    Breast cancer 2018   Left Breast Cancer   CAD (coronary artery disease)    Dr. Mendel Ryder follows, no problems now   Cancer    Breast cancer   Complication of anesthesia    Diabetes mellitus, type 2    TYPE 2   Dysrhythmia    occ skips a beat, rate was fast before beta blocker.   Gastric polyp    Genetic testing 04/12/2017   Ms. Zent underwent genetic counseling and testing for hereditary cancer syndromes on 02/19/2017. Her results were negative for mutations in all 46 genes analyzed by [facility]'s 46-gene Common Hereditary Cancers Panel. Genes analyzed include: APC, ATM, AXIN2, BARD1, BMPR1A, BRCA1, BRCA2, BRIP1, CDH1, CDKN2A, CHEK2, CTNNA1, DICER1, EPCAM, GREM1, HOXB13, KIT, MEN1, MLH1, MSH2, MSH3, MSH6, MUTYH, NBN   GERD (gastroesophageal reflux disease)    Headache    hx of   Heartburn    Hiatal hernia    mild head tremors    History of radiation therapy 01/30/17- 02/21/17   Left Breast 42.56 Gy in 16 fractions   Hypertension    Hypothyroidism    Occasional tremors    "of head", slight hands   Osteopenia    Personal history of radiation therapy 2018   Left Breast Cancer   Pneumonia    PONV (postoperative nausea and vomiting) 02/24/14   Shortness of breath    with exertion   Sleep apnea    cpap used nightly x 2 yrs now.    PAST SURGICAL HISTORY: Past Surgical History:  Procedure Laterality Date   BREAST BIOPSY Left 1987   BREAST BIOPSY Left 2019   BREAST LUMPECTOMY Left 01/25/2017   BREAST LUMPECTOMY WITH RADIOACTIVE SEED AND SENTINEL LYMPH NODE BIOPSY Left 12/25/2016   Procedure: BREAST LUMPECTOMY WITH RADIOACTIVE  SEED AND SENTINEL LYMPH NODE BIOPSY;  Surgeon: Glenna Fellows, MD;  Location: [facility] OR;  Service: General;  Laterality: Left;   CARDIAC CATHETERIZATION  06/02/2014   LAD 30%, D1 80%, CFX 755, RCA > 95% ISR, rx w/ PTCA, heavy calcification, EF 65%   CORONARY ARTERY BYPASS GRAFT N/A 06/14/2014   Procedure: CORONARY ARTERY BYPASS GRAFTING (CABG);  Surgeon: Alleen Borne, MD;  Location: [facility] OR;  Service: Open Heart Surgery;  Laterality: N/A;  Times 2 using endoscopically harvested right saphenous vein.   coronary stents     x3 stents placed-prior ablation   DILATION AND CURETTAGE OF UTERUS     ELBOW FRACTURE SURGERY Left    INCONTINENCE SURGERY     sling   KNEE ARTHROSCOPY Right 02/24/2014   Procedure: RIGHT KNEE ARTHROSCOPY WITH DEBRIDEMENT ;  Surgeon: Loanne Drilling, MD;  Location: [facility] ORS;  Service: Orthopedics;  Laterality: Right;   LEFT HEART CATHETERIZATION WITH CORONARY ANGIOGRAM N/A 06/02/2014   Procedure: LEFT HEART CATHETERIZATION WITH CORONARY ANGIOGRAM;  Surgeon: Lesleigh Noe, MD;  Location: [facility] CATH LAB;  Service: Cardiovascular;  Laterality: N/A;   TEE WITHOUT CARDIOVERSION N/A 06/14/2014   Procedure: TRANSESOPHAGEAL ECHOCARDIOGRAM (TEE);  Surgeon: Alleen Borne, MD;  Location:  [facility] OR;  Service: Open Heart Surgery;  Laterality: N/A;    FAMILY HISTORY: The patient family history includes Breast cancer (age of onset: 61) in her cousin; Diabetes in her mother; Healthy in her brother; Heart attack in her mother; Hodgkin's lymphoma in her maternal uncle; Hypertension in her mother; Lung cancer in her brother; Ulcerative colitis in her daughter.  SOCIAL HISTORY:  The patient  reports that she has never smoked. She has never used smokeless tobacco. She reports that she does not drink alcohol and does not use drugs.  REVIEW OF SYSTEMS: Review of Systems  Cardiovascular:  Positive for chest pain, dyspnea on exertion and leg swelling (feet). Negative for claudication, irregular heartbeat, near-syncope, orthopnea, palpitations, paroxysmal nocturnal dyspnea and syncope.  Respiratory:  Positive for shortness of breath.   Hematologic/Lymphatic: Negative for bleeding problem.  Musculoskeletal:  Negative for muscle cramps and myalgias.  Neurological:  Negative for dizziness and light-headedness.    PHYSICAL EXAM:    11/30/2022   10:17 AM 05/08/2022    9:42 AM 01/04/2022    4:20 PM  Vitals with BMI  Height 5\' 2"  5\' 2"  5\' 2"   Weight 155 lbs  155 lbs 13 oz  BMI 28.34  28.49  Systolic 173 136 161  Diastolic 80 80 68  Pulse 78  81    Physical Exam  Constitutional: No distress.  Age appropriate, hemodynamically stable.   Neck: No JVD present.  Cardiovascular: Normal rate, regular rhythm, S1 normal, S2 normal, intact distal pulses and normal pulses. Exam reveals no gallop, no S3 and no S4.  No murmur heard. Pulmonary/Chest: Effort normal and breath sounds normal. No stridor. She has no wheezes. She has no rales.  Abdominal: Soft. Bowel sounds are normal. She exhibits no distension. There is no abdominal tenderness.  Musculoskeletal:        General: No edema.     Cervical back: Neck supple.  Neurological: She is alert and oriented to person, place, and time. She has intact  cranial nerves (2-12).  Skin: Skin is warm and moist.   CARDIAC DATABASE: EKG: November 30, 2022: Sinus rhythm, 75 bpm, normal axis, LAE, without underlying ischemia or injury pattern  Echocardiogram: 09/01/2018: LVEF 55 to 60%, grade 2  diastolic dysfunction, trivial AR, mild MR, mild left atrial enlargement.  Stress Testing: MPI 05/18/2020  Nuclear stress EF: 72%. No wall motion abnormalities.  There was no ST segment deviation noted during stress.  The study is normal.  This is a low risk study. No evidence of ischemia or infarction. Prior CABG.  Heart Catheterization: None  LABORATORY DATA:    Latest Ref Rng & Units 11/30/2022   11:54 AM 05/10/2020    4:05 PM 12/20/2019    3:21 AM  CBC  WBC 3.4 - 10.8 x10E3/uL 10.9  11.3  11.9   Hemoglobin 11.1 - 15.9 g/dL 40.9  81.1  91.4   Hematocrit 34.0 - 46.6 % 40.5  37.2  36.3   Platelets 150 - 450 x10E3/uL 313  341  328        Latest Ref Rng & Units 11/30/2022   11:54 AM 06/20/2020    2:35 PM 05/10/2020    4:05 PM  CMP  Glucose 70 - 99 mg/dL 782  956  213   BUN 8 - 27 mg/dL Creatinine 0.57 - 1.00 mg/dL 0.86  5.78  4.69   Sodium 134 - 144 mmol/L 138  139  140   Potassium 3.5 - 5.2 mmol/L 4.3  4.3  4.5   Chloride 96 - 106 mmol/L 98  101  104   CO2 20 - 29 mmol/L Calcium 8.7 - 10.3 mg/dL 62.9  9.7  9.7     Lipid Panel     Component Value Date/Time   CHOL  08/13/2007 0615    116        ATP III CLASSIFICATION:  <200     mg/dL   Desirable  528-413  mg/dL   Borderline High  >=244    mg/dL   High   TRIG 69 08/15/7251 0615   HDL 41 08/13/2007 0615   CHOLHDL 2.8 08/13/2007 0615   VLDL 14 08/13/2007 0615   LDLCALC  08/13/2007 0615    61        Total Cholesterol/HDL:CHD Risk Coronary Heart Disease Risk Table                     Men   Women  1/2 Average Risk   3.4   3.3    No components found for: "NTPROBNP" Recent Labs    11/30/22 1154  PROBNP 793*   No results for input(s): "TSH" in the  last 8760 hours.  BMP Recent Labs    11/30/22 1154  NA 138  K 4.3  CL 98  CO2 25  GLUCOSE 313*  BUN 15  CREATININE 0.69  CALCIUM 10.8*    HEMOGLOBIN A1C Lab Results  Component Value Date   HGBA1C 8.4 (H) 12/18/2019   MPG 194.38 12/18/2019   External Labs: Collected: 07/26/2022 Hemoglobin A1c 7.6.  Collected: 03/27/2022 Sodium 137, potassium 4.8, chloride 104, bicarb 27. AST 17, ALT 16, alkaline phosphatase 55. BUN 11, creatinine 0.8. Hemoglobin 13.8, hematocrit 39.2%. Total cholesterol 135, triglycerides 160, HDL 38, LDL 65, non-HDL 97. TSH 0.17  IMPRESSION:    ICD-10-CM   1. Coronary artery disease involving native coronary artery of native heart with other form of angina pectoris  I25.118 isosorbide mononitrate (IMDUR) 30 MG 24 hr tablet    CBC    Basic metabolic panel    Pro b natriuretic peptide (BNP)    2. Hx of CABG  Z95.1  3. Precordial pain  R07.2 EKG 12-Lead    4. Essential hypertension  I10 hydrochlorothiazide (HYDRODIURIL) 25 MG tablet    isosorbide mononitrate (IMDUR) 30 MG 24 hr tablet    5. Hyperlipidemia LDL goal <70  E78.5     6. Mixed hyperlipidemia  E78.2     7. OSA (obstructive sleep apnea)  G47.33     8. Type 2 diabetes mellitus with complications  E11.8        RECOMMENDATIONS: SARAIYA KOZMA is a 77 y.o. Caucasian female whose past medical history and cardiac risk factors include: CAD status post CABG 2015 (SVG to diagonal and SVG to PDA), hypertension, diabetes mellitus type 2, hyperlipidemia, OSA.  Coronary artery disease involving native coronary artery of native heart with other form of angina pectoris Hx of CABG Precordial pain Her symptoms are very concerning for cardiac etiology. EKG is nonischemic. No active pain at the time of the office visit. Patient remains reluctant to go to the ED today and would like to discuss this further with her husband. I have asked her to keep a very low threshold to go to the ED for  further evaluation and management as her symptoms are very concerning for cardiac discomfort. Echo will be ordered to evaluate for structural heart disease and left ventricular systolic function. Her pretest probability for obstructive CAD or disease involving her prior vein grafts is high and therefore stress test is not the ideal test of choice.  However we did discuss the risks, benefits, alternatives to left heart catheterization with possible intervention and patient would like to proceed with angiography as opposed to stress test at this time.  Shared Decision Making/Informed Consent The risks [stroke (1 in 1000), death (1 in 1000), kidney failure [usually temporary] (1 in 500), bleeding (1 in 200), allergic reaction [possibly serious] (1 in 200)], benefits (diagnostic support and management of coronary artery disease) and alternatives of a cardiac catheterization were discussed in detail with Ms. Tuccillo and she is willing to proceed.  She is allergic to aspirin. Continue Plavix Continue statin therapy Antianginal therapies include: Metoprolol, sublingual nitroglycerin tablets, Norvasc, Imdur (initiating therapy). Will lopressor to toprol at the next visit (wanted to avoid too many medication changes).   Essential hypertension Office blood pressures are not well-controlled. Medications reconciled. Start hydrochlorothiazide 25 mg p.o. daily. Patient is asked to keep a log of blood pressures at home. Ideally in the long-term would like to wean off of clonidine. Not a great candidate for SGLT2 inhibitors given the history of yeast/UTIs.  Mixed hyperlipidemia Patient remains reluctant to go up on statin therapy. Given her history of CAD and prior CABG recommend a goal LDL <55 mg/dL. Continue current therapy for now until the heart cath is complete and will be discussed at the next visit  Type 2 diabetes mellitus with complications Currently follows with Dr. Evlyn Kanner Reemphasized importance  of glycemic control. Currently on ARB, statin therapy Not a candidate for SGLT2 inhibitors given her history of yeast infections/UTIs  Data Reviewed: I have independently reviewed external notes provided by the referring provider as part of this office visit.   I have independently reviewed results of EKG, labs as part of medical decision making. I have ordered the following tests:  Orders Placed This Encounter  Procedures   CBC   Basic metabolic panel   Pro b natriuretic peptide (BNP)   EKG 12-Lead   PCV ECHOCARDIOGRAM COMPLETE    Standing Status:   Future    Standing  Expiration Date:   12/01/2023  I have made medications changes at today's encounter as noted above. History of present illness was obtained by both patient and friend ([place]) at today's office visit.   FINAL MEDICATION LIST END OF ENCOUNTER: Meds ordered this encounter  Medications   hydrochlorothiazide (HYDRODIURIL) 25 MG tablet    Sig: Take 1 tablet (25 mg total) by mouth every morning.    Dispense:  90 tablet    Refill:  0   isosorbide mononitrate (IMDUR) 30 MG 24 hr tablet    Sig: Take 1 tablet (30 mg total) by mouth daily at 10 pm.    Dispense:  90 tablet    Refill:  0   nitroGLYCERIN (NITROSTAT) 0.4 MG SL tablet    Sig: Place 1 tablet (0.4 mg total) under the tongue every 5 (five) minutes as needed for chest pain.    Dispense:  25 tablet    Refill:  1    Please call our office to schedule an yearly appointment with Dr. Katrinka Blazing for May 2023 before anymore refills. [phone]. Thank you 1st attempt    Medications Discontinued During This Encounter  Medication Reason   meclizine (ANTIVERT) 25 MG tablet    hydrochlorothiazide (MICROZIDE) 12.5 MG capsule    nitroGLYCERIN (NITROSTAT) 0.4 MG SL tablet Reorder     Current Outpatient Medications:    amLODipine (NORVASC) 5 MG tablet, Take 5 mg by mouth daily., Disp: , Rfl:    atorvastatin (LIPITOR) 10 MG tablet, Take 10 mg by mouth every evening. , Disp: ,  Rfl:    CALCIUM PO, Take 1 tablet by mouth in the morning and at bedtime., Disp: , Rfl:    Cetirizine HCl (ZYRTEC ALLERGY) 10 MG CAPS, Take 1 tablet by mouth daily., Disp: , Rfl:    cloNIDine (CATAPRES) 0.1 MG tablet, TAKE ONE TABLET BY MOUTH EVERY MORNING and TAKE ONE TABLET BY MOUTH EVERY EVENING, Disp: 180 tablet, Rfl: 0   clopidogrel (PLAVIX) 75 MG tablet, Take 75 mg by mouth daily., Disp: , Rfl:    Coenzyme Q10 300 MG CAPS, Take 300 mg by mouth 2 (two) times daily., Disp: , Rfl:    D-Mannose 500 MG CAPS, Take 500 mg by mouth in the morning and at bedtime., Disp: , Rfl:    hydrochlorothiazide (HYDRODIURIL) 25 MG tablet, Take 1 tablet (25 mg total) by mouth every morning., Disp: 90 tablet, Rfl: 0   isosorbide mononitrate (IMDUR) 30 MG 24 hr tablet, Take 1 tablet (30 mg total) by mouth daily at 10 pm., Disp: 90 tablet, Rfl: 0   LANTUS SOLOSTAR 100 UNIT/ML Solostar Pen, Inject 40 Units into the skin at bedtime. (Patient taking differently: Inject 42 Units into the skin at bedtime.), Disp: 15 mL, Rfl: 0   levothyroxine (SYNTHROID, LEVOTHROID) 75 MCG tablet, Take 75 mcg by mouth daily before breakfast. , Disp: , Rfl:    losartan (COZAAR) 100 MG tablet, Take 1 tablet (100 mg total) by mouth daily., Disp: 90 tablet, Rfl: 3   metoprolol tartrate (LOPRESSOR) 50 MG tablet, TAKE TWO TABLETS BY MOUTH EVERY MORNING and TAKE TWO TABLETS BY MOUTH EVERY EVENING, Disp: 360 tablet, Rfl: 3   miconazole (ANTIFUNGAL) 2 % powder, Apply 1 Application topically as needed for itching., Disp: , Rfl:    Multiple Vitamin (MULTIVITAMIN ADULT PO), Take 1 tablet by mouth daily., Disp: , Rfl:    ONETOUCH VERIO test strip, 1 each 2 (two) times daily., Disp: , Rfl:    pantoprazole (PROTONIX)  40 MG tablet, Take 1 tablet (40 mg total) by mouth daily before breakfast., Disp: 90 tablet, Rfl: 3   sitaGLIPtin-metformin (JANUMET) 50-1000 MG tablet, Take 1 tablet by mouth 2 (two) times daily with a meal., Disp: , Rfl:    acetaminophen  (TYLENOL) 650 MG CR tablet, Take 1,300 mg by mouth in the morning and at bedtime., Disp: , Rfl:    nitroGLYCERIN (NITROSTAT) 0.4 MG SL tablet, Place 1 tablet (0.4 mg total) under the tongue every 5 (five) minutes as needed for chest pain., Disp: 25 tablet, Rfl: 1   Probiotic Product (ALIGN) 4 MG CAPS, Take 4 mg by mouth daily., Disp: , Rfl:   Orders Placed This Encounter  Procedures   CBC   Basic metabolic panel   Pro b natriuretic peptide (BNP)   EKG 12-Lead    There are no Patient Instructions on file for this visit.   --Continue cardiac medications as reconciled in final medication list. --Return in about 2 weeks (around 12/14/2022) for Follow up, Post heart catheterization, CAD. or sooner if needed. --Continue follow-up with your primary care physician regarding the management of your other chronic comorbid conditions.  Patient's questions and concerns were addressed to her satisfaction. She voices understanding of the instructions provided during this encounter.   This note was created using a voice recognition software as a result there may be grammatical errors inadvertently enclosed that do not reflect the nature of this encounter. Every attempt is made to correct such errors.  Tessa Lerner, [place], [facility]  Pager:  [phone] Office: [phone]

## 2022-12-01 LAB — CBC
Hematocrit: 40.5 % (ref 34.0–46.6)
Hemoglobin: 13.2 g/dL (ref 11.1–15.9)
MCH: 28.9 pg (ref 26.6–33.0)
MCHC: 32.6 g/dL (ref 31.5–35.7)
MCV: 89 fL (ref 79–97)
Platelets: 313 10*3/uL (ref 150–450)
RBC: 4.56 x10E6/uL (ref 3.77–5.28)
RDW: 13 % (ref 11.7–15.4)
WBC: 10.9 10*3/uL — ABNORMAL HIGH (ref 3.4–10.8)

## 2022-12-01 LAB — BASIC METABOLIC PANEL
BUN/Creatinine Ratio: 22 (ref 12–28)
BUN: 15 mg/dL (ref 8–27)
CO2: 25 mmol/L (ref 20–29)
Calcium: 10.8 mg/dL — ABNORMAL HIGH (ref 8.7–10.3)
Chloride: 98 mmol/L (ref 96–106)
Creatinine, Ser: 0.69 mg/dL (ref 0.57–1.00)
Glucose: 313 mg/dL — ABNORMAL HIGH (ref 70–99)
Potassium: 4.3 mmol/L (ref 3.5–5.2)
Sodium: 138 mmol/L (ref 134–144)
eGFR: 89 mL/min/{1.73_m2} (ref >59)

## 2022-12-01 LAB — PRO B NATRIURETIC PEPTIDE: NT-Pro BNP: 793 pg/mL — ABNORMAL HIGH (ref 0–738)

## 2022-12-02 ENCOUNTER — Inpatient Hospital Stay (HOSPITAL_COMMUNITY)
Admission: EM | Admit: 2022-12-02 | Discharge: 2022-12-05 | DRG: 280 | Disposition: A | Discharge status: Home Health | Payer: Medicare Other | Attending: Family Medicine | Admitting: Family Medicine

## 2022-12-02 ENCOUNTER — Other Ambulatory Visit: Payer: Self-pay

## 2022-12-02 ENCOUNTER — Emergency Department (HOSPITAL_COMMUNITY): Payer: Medicare Other

## 2022-12-02 DIAGNOSIS — I5032 Chronic diastolic (congestive) heart failure: Secondary | ICD-10-CM | POA: Diagnosis not present

## 2022-12-02 DIAGNOSIS — R570 Cardiogenic shock: Secondary | ICD-10-CM | POA: Diagnosis not present

## 2022-12-02 DIAGNOSIS — Z79899 Other long term (current) drug therapy: Secondary | ICD-10-CM | POA: Diagnosis not present

## 2022-12-02 DIAGNOSIS — R29818 Other symptoms and signs involving the nervous system: Secondary | ICD-10-CM | POA: Diagnosis not present

## 2022-12-02 DIAGNOSIS — Z794 Long term (current) use of insulin: Secondary | ICD-10-CM | POA: Diagnosis not present

## 2022-12-02 DIAGNOSIS — Z7902 Long term (current) use of antithrombotics/antiplatelets: Secondary | ICD-10-CM

## 2022-12-02 DIAGNOSIS — Z886 Allergy status to analgesic agent status: Secondary | ICD-10-CM

## 2022-12-02 DIAGNOSIS — Z923 Personal history of irradiation: Secondary | ICD-10-CM

## 2022-12-02 DIAGNOSIS — I2510 Atherosclerotic heart disease of native coronary artery without angina pectoris: Secondary | ICD-10-CM | POA: Diagnosis not present

## 2022-12-02 DIAGNOSIS — G4733 Obstructive sleep apnea (adult) (pediatric): Secondary | ICD-10-CM | POA: Diagnosis not present

## 2022-12-02 DIAGNOSIS — Z91013 Allergy to seafood: Secondary | ICD-10-CM

## 2022-12-02 DIAGNOSIS — Z888 Allergy status to other drugs, medicaments and biological substances status: Secondary | ICD-10-CM

## 2022-12-02 DIAGNOSIS — E876 Hypokalemia: Secondary | ICD-10-CM | POA: Diagnosis present

## 2022-12-02 DIAGNOSIS — R7989 Other specified abnormal findings of blood chemistry: Secondary | ICD-10-CM | POA: Diagnosis not present

## 2022-12-02 DIAGNOSIS — M8580 Other specified disorders of bone density and structure, unspecified site: Secondary | ICD-10-CM | POA: Diagnosis not present

## 2022-12-02 DIAGNOSIS — R251 Tremor, unspecified: Secondary | ICD-10-CM | POA: Diagnosis not present

## 2022-12-02 DIAGNOSIS — I9589 Other hypotension: Secondary | ICD-10-CM | POA: Diagnosis not present

## 2022-12-02 DIAGNOSIS — G4730 Sleep apnea, unspecified: Secondary | ICD-10-CM

## 2022-12-02 DIAGNOSIS — E119 Type 2 diabetes mellitus without complications: Secondary | ICD-10-CM

## 2022-12-02 DIAGNOSIS — I10 Essential (primary) hypertension: Secondary | ICD-10-CM | POA: Diagnosis not present

## 2022-12-02 DIAGNOSIS — E039 Hypothyroidism, unspecified: Secondary | ICD-10-CM | POA: Diagnosis present

## 2022-12-02 DIAGNOSIS — I110 Hypertensive heart disease with heart failure: Secondary | ICD-10-CM | POA: Diagnosis not present

## 2022-12-02 DIAGNOSIS — Z955 Presence of coronary angioplasty implant and graft: Secondary | ICD-10-CM | POA: Diagnosis not present

## 2022-12-02 DIAGNOSIS — Z79890 Hormone replacement therapy (postmenopausal): Secondary | ICD-10-CM

## 2022-12-02 DIAGNOSIS — Z538 Procedure and treatment not carried out for other reasons: Secondary | ICD-10-CM | POA: Diagnosis not present

## 2022-12-02 DIAGNOSIS — Z8719 Personal history of other diseases of the digestive system: Secondary | ICD-10-CM

## 2022-12-02 DIAGNOSIS — R0789 Other chest pain: Secondary | ICD-10-CM | POA: Diagnosis not present

## 2022-12-02 DIAGNOSIS — R410 Disorientation, unspecified: Secondary | ICD-10-CM | POA: Diagnosis not present

## 2022-12-02 DIAGNOSIS — Z7984 Long term (current) use of oral hypoglycemic drugs: Secondary | ICD-10-CM | POA: Diagnosis not present

## 2022-12-02 DIAGNOSIS — I442 Atrioventricular block, complete: Secondary | ICD-10-CM | POA: Diagnosis not present

## 2022-12-02 DIAGNOSIS — R0602 Shortness of breath: Secondary | ICD-10-CM | POA: Diagnosis not present

## 2022-12-02 DIAGNOSIS — I214 Non-ST elevation (NSTEMI) myocardial infarction: Principal | ICD-10-CM | POA: Diagnosis present

## 2022-12-02 DIAGNOSIS — E1165 Type 2 diabetes mellitus with hyperglycemia: Secondary | ICD-10-CM

## 2022-12-02 DIAGNOSIS — Z882 Allergy status to sulfonamides status: Secondary | ICD-10-CM

## 2022-12-02 DIAGNOSIS — E1169 Type 2 diabetes mellitus with other specified complication: Secondary | ICD-10-CM

## 2022-12-02 DIAGNOSIS — Z803 Family history of malignant neoplasm of breast: Secondary | ICD-10-CM

## 2022-12-02 DIAGNOSIS — I25110 Atherosclerotic heart disease of native coronary artery with unstable angina pectoris: Secondary | ICD-10-CM

## 2022-12-02 DIAGNOSIS — Z853 Personal history of malignant neoplasm of breast: Secondary | ICD-10-CM | POA: Diagnosis not present

## 2022-12-02 DIAGNOSIS — K219 Gastro-esophageal reflux disease without esophagitis: Secondary | ICD-10-CM | POA: Diagnosis present

## 2022-12-02 DIAGNOSIS — E782 Mixed hyperlipidemia: Secondary | ICD-10-CM | POA: Diagnosis not present

## 2022-12-02 DIAGNOSIS — R079 Chest pain, unspecified: Secondary | ICD-10-CM | POA: Diagnosis not present

## 2022-12-02 DIAGNOSIS — Z8249 Family history of ischemic heart disease and other diseases of the circulatory system: Secondary | ICD-10-CM | POA: Diagnosis not present

## 2022-12-02 DIAGNOSIS — R471 Dysarthria and anarthria: Secondary | ICD-10-CM | POA: Diagnosis not present

## 2022-12-02 DIAGNOSIS — Z951 Presence of aortocoronary bypass graft: Secondary | ICD-10-CM | POA: Diagnosis not present

## 2022-12-02 DIAGNOSIS — I700 Atherosclerosis of aorta: Secondary | ICD-10-CM | POA: Diagnosis not present

## 2022-12-02 NOTE — ED Triage Notes (Signed)
Pt presents w/ tightness through the chest and throat. Pt took Protonix but tightness would not go away. Ongoing for a week, went to primary care and have scheduled a cardiac cath for Tuesday to investigate. Usually w/ exertion or during the night but does not last that long. Two tabs of Nitro given en route .4 mg.

## 2022-12-03 ENCOUNTER — Inpatient Hospital Stay (HOSPITAL_COMMUNITY): Payer: Medicare Other

## 2022-12-03 ENCOUNTER — Inpatient Hospital Stay (HOSPITAL_COMMUNITY)
Admission: EM | Disposition: A | Discharge status: Home Health | Payer: Self-pay | Source: Home / Self Care | Attending: Internal Medicine

## 2022-12-03 ENCOUNTER — Other Ambulatory Visit: Payer: Self-pay

## 2022-12-03 ENCOUNTER — Encounter (HOSPITAL_COMMUNITY): Payer: Self-pay | Admitting: Internal Medicine

## 2022-12-03 DIAGNOSIS — G4733 Obstructive sleep apnea (adult) (pediatric): Secondary | ICD-10-CM | POA: Diagnosis present

## 2022-12-03 DIAGNOSIS — Z955 Presence of coronary angioplasty implant and graft: Secondary | ICD-10-CM | POA: Diagnosis not present

## 2022-12-03 DIAGNOSIS — E1169 Type 2 diabetes mellitus with other specified complication: Secondary | ICD-10-CM

## 2022-12-03 DIAGNOSIS — Z8249 Family history of ischemic heart disease and other diseases of the circulatory system: Secondary | ICD-10-CM | POA: Diagnosis not present

## 2022-12-03 DIAGNOSIS — Z79890 Hormone replacement therapy (postmenopausal): Secondary | ICD-10-CM | POA: Diagnosis not present

## 2022-12-03 DIAGNOSIS — M8580 Other specified disorders of bone density and structure, unspecified site: Secondary | ICD-10-CM | POA: Diagnosis present

## 2022-12-03 DIAGNOSIS — Z886 Allergy status to analgesic agent status: Secondary | ICD-10-CM | POA: Diagnosis not present

## 2022-12-03 DIAGNOSIS — Z853 Personal history of malignant neoplasm of breast: Secondary | ICD-10-CM | POA: Diagnosis not present

## 2022-12-03 DIAGNOSIS — I110 Hypertensive heart disease with heart failure: Secondary | ICD-10-CM | POA: Diagnosis present

## 2022-12-03 DIAGNOSIS — I5032 Chronic diastolic (congestive) heart failure: Secondary | ICD-10-CM | POA: Diagnosis present

## 2022-12-03 DIAGNOSIS — Z79899 Other long term (current) drug therapy: Secondary | ICD-10-CM | POA: Diagnosis not present

## 2022-12-03 DIAGNOSIS — R7989 Other specified abnormal findings of blood chemistry: Secondary | ICD-10-CM | POA: Diagnosis not present

## 2022-12-03 DIAGNOSIS — I214 Non-ST elevation (NSTEMI) myocardial infarction: Principal | ICD-10-CM | POA: Diagnosis present

## 2022-12-03 DIAGNOSIS — G4730 Sleep apnea, unspecified: Secondary | ICD-10-CM

## 2022-12-03 DIAGNOSIS — I10 Essential (primary) hypertension: Secondary | ICD-10-CM

## 2022-12-03 DIAGNOSIS — Z923 Personal history of irradiation: Secondary | ICD-10-CM | POA: Diagnosis not present

## 2022-12-03 DIAGNOSIS — R079 Chest pain, unspecified: Secondary | ICD-10-CM | POA: Diagnosis present

## 2022-12-03 DIAGNOSIS — I25110 Atherosclerotic heart disease of native coronary artery with unstable angina pectoris: Secondary | ICD-10-CM

## 2022-12-03 DIAGNOSIS — I442 Atrioventricular block, complete: Secondary | ICD-10-CM | POA: Diagnosis not present

## 2022-12-03 DIAGNOSIS — E039 Hypothyroidism, unspecified: Secondary | ICD-10-CM | POA: Diagnosis not present

## 2022-12-03 DIAGNOSIS — K219 Gastro-esophageal reflux disease without esophagitis: Secondary | ICD-10-CM | POA: Diagnosis present

## 2022-12-03 DIAGNOSIS — E876 Hypokalemia: Secondary | ICD-10-CM | POA: Diagnosis present

## 2022-12-03 DIAGNOSIS — Z951 Presence of aortocoronary bypass graft: Secondary | ICD-10-CM

## 2022-12-03 DIAGNOSIS — I2510 Atherosclerotic heart disease of native coronary artery without angina pectoris: Secondary | ICD-10-CM | POA: Diagnosis present

## 2022-12-03 DIAGNOSIS — Z7902 Long term (current) use of antithrombotics/antiplatelets: Secondary | ICD-10-CM | POA: Diagnosis not present

## 2022-12-03 DIAGNOSIS — I9589 Other hypotension: Secondary | ICD-10-CM | POA: Diagnosis not present

## 2022-12-03 DIAGNOSIS — E782 Mixed hyperlipidemia: Secondary | ICD-10-CM | POA: Diagnosis present

## 2022-12-03 DIAGNOSIS — Z794 Long term (current) use of insulin: Secondary | ICD-10-CM

## 2022-12-03 DIAGNOSIS — R570 Cardiogenic shock: Secondary | ICD-10-CM | POA: Diagnosis present

## 2022-12-03 DIAGNOSIS — Z7984 Long term (current) use of oral hypoglycemic drugs: Secondary | ICD-10-CM | POA: Diagnosis not present

## 2022-12-03 DIAGNOSIS — E119 Type 2 diabetes mellitus without complications: Secondary | ICD-10-CM

## 2022-12-03 HISTORY — PX: TEMPORARY PACEMAKER: CATH118268

## 2022-12-03 HISTORY — PX: LEFT HEART CATH AND CORS/GRAFTS ANGIOGRAPHY: CATH118250

## 2022-12-03 LAB — CBC WITH DIFFERENTIAL/PLATELET
Abs Immature Granulocytes: 0.05 10*3/uL (ref 0.00–0.07)
Abs Immature Granulocytes: 0.05 10*3/uL (ref 0.00–0.07)
Basophils Absolute: 0 10*3/uL (ref 0.0–0.1)
Basophils Absolute: 0.1 10*3/uL (ref 0.0–0.1)
Basophils Relative: 0 %
Basophils Relative: 0 %
Eosinophils Absolute: 0.5 10*3/uL (ref 0.0–0.5)
Eosinophils Absolute: 0.7 10*3/uL — ABNORMAL HIGH (ref 0.0–0.5)
Eosinophils Relative: 4 %
Eosinophils Relative: 6 %
HCT: 37.2 % (ref 36.0–46.0)
HCT: 36.8 % (ref 36.0–46.0)
Hemoglobin: 12.5 g/dL (ref 12.0–15.0)
Hemoglobin: 12.1 g/dL (ref 12.0–15.0)
Immature Granulocytes: 0 %
Immature Granulocytes: 0 %
Lymphocytes Relative: 16 %
Lymphocytes Relative: 22 %
Lymphs Abs: 1.8 10*3/uL (ref 0.7–4.0)
Lymphs Abs: 2.8 10*3/uL (ref 0.7–4.0)
MCH: 29.9 pg (ref 26.0–34.0)
MCH: 29.2 pg (ref 26.0–34.0)
MCHC: 33.6 g/dL (ref 30.0–36.0)
MCHC: 32.9 g/dL (ref 30.0–36.0)
MCV: 89 fL (ref 80.0–100.0)
MCV: 88.9 fL (ref 80.0–100.0)
Monocytes Absolute: 0.8 10*3/uL (ref 0.1–1.0)
Monocytes Absolute: 0.9 10*3/uL (ref 0.1–1.0)
Monocytes Relative: 7 %
Monocytes Relative: 7 %
Neutro Abs: 8.3 10*3/uL — ABNORMAL HIGH (ref 1.7–7.7)
Neutro Abs: 8 10*3/uL — ABNORMAL HIGH (ref 1.7–7.7)
Neutrophils Relative %: 73 %
Neutrophils Relative %: 65 %
Platelets: 245 10*3/uL (ref 150–400)
Platelets: 271 10*3/uL (ref 150–400)
RBC: 4.18 MIL/uL (ref 3.87–5.11)
RBC: 4.14 MIL/uL (ref 3.87–5.11)
RDW: 13 % (ref 11.5–15.5)
RDW: 13 % (ref 11.5–15.5)
WBC: 11.5 10*3/uL — ABNORMAL HIGH (ref 4.0–10.5)
WBC: 12.5 10*3/uL — ABNORMAL HIGH (ref 4.0–10.5)
nRBC: 0 % (ref 0.0–0.2)
nRBC: 0 % (ref 0.0–0.2)

## 2022-12-03 LAB — COMPREHENSIVE METABOLIC PANEL
ALT: 16 U/L (ref 0–44)
ALT: 18 U/L (ref 0–44)
AST: 21 U/L (ref 15–41)
AST: 25 U/L (ref 15–41)
Albumin: 3.3 g/dL — ABNORMAL LOW (ref 3.5–5.0)
Albumin: 3.5 g/dL (ref 3.5–5.0)
Alkaline Phosphatase: 44 U/L (ref 38–126)
Alkaline Phosphatase: 40 U/L (ref 38–126)
Anion gap: 10 (ref 5–15)
Anion gap: 8 (ref 5–15)
BUN: 17 mg/dL (ref 8–23)
BUN: 16 mg/dL (ref 8–23)
CO2: 24 mmol/L (ref 22–32)
CO2: 27 mmol/L (ref 22–32)
Calcium: 8.6 mg/dL — ABNORMAL LOW (ref 8.9–10.3)
Calcium: 9.3 mg/dL (ref 8.9–10.3)
Chloride: 101 mmol/L (ref 98–111)
Chloride: 102 mmol/L (ref 98–111)
Creatinine, Ser: 0.87 mg/dL (ref 0.44–1.00)
Creatinine, Ser: 0.81 mg/dL (ref 0.44–1.00)
GFR, Estimated: >60 mL/min (ref >60)
GFR, Estimated: >60 mL/min (ref >60)
Glucose, Bld: 213 mg/dL — ABNORMAL HIGH (ref 70–99)
Glucose, Bld: 146 mg/dL — ABNORMAL HIGH (ref 70–99)
Potassium: 3.2 mmol/L — ABNORMAL LOW (ref 3.5–5.1)
Potassium: 3.3 mmol/L — ABNORMAL LOW (ref 3.5–5.1)
Sodium: 135 mmol/L (ref 135–145)
Sodium: 137 mmol/L (ref 135–145)
Total Bilirubin: 0.4 mg/dL (ref 0.3–1.2)
Total Bilirubin: 0.6 mg/dL (ref 0.3–1.2)
Total Protein: 6.2 g/dL — ABNORMAL LOW (ref 6.5–8.1)
Total Protein: 6.7 g/dL (ref 6.5–8.1)

## 2022-12-03 LAB — ECHOCARDIOGRAM COMPLETE
Area-P 1/2: 3.08 cm2
Calc EF: 72.7 %
Height: 61 in
S' Lateral: 2.4 cm
Single Plane A2C EF: 71.6 %
Single Plane A4C EF: 74.9 %
Weight: 2400 oz

## 2022-12-03 LAB — TSH: TSH: 0.381 u[IU]/mL (ref 0.350–4.500)

## 2022-12-03 LAB — CBC
HCT: 37.5 % (ref 36.0–46.0)
Hemoglobin: 12.6 g/dL (ref 12.0–15.0)
MCH: 29.4 pg (ref 26.0–34.0)
MCHC: 33.6 g/dL (ref 30.0–36.0)
MCV: 87.6 fL (ref 80.0–100.0)
Platelets: 284 10*3/uL (ref 150–400)
RBC: 4.28 MIL/uL (ref 3.87–5.11)
RDW: 13.3 % (ref 11.5–15.5)
WBC: 23.3 10*3/uL — ABNORMAL HIGH (ref 4.0–10.5)
nRBC: 0 % (ref 0.0–0.2)

## 2022-12-03 LAB — HEMOGLOBIN A1C
Hgb A1c MFr Bld: 8.7 % — ABNORMAL HIGH (ref 4.8–5.6)
Mean Plasma Glucose: 202.99 mg/dL

## 2022-12-03 LAB — BASIC METABOLIC PANEL
Anion gap: 10 (ref 5–15)
BUN: 12 mg/dL (ref 8–23)
CO2: 22 mmol/L (ref 22–32)
Calcium: 8.5 mg/dL — ABNORMAL LOW (ref 8.9–10.3)
Chloride: 105 mmol/L (ref 98–111)
Creatinine, Ser: 0.82 mg/dL (ref 0.44–1.00)
GFR, Estimated: >60 mL/min (ref >60)
Glucose, Bld: 206 mg/dL — ABNORMAL HIGH (ref 70–99)
Potassium: 3.2 mmol/L — ABNORMAL LOW (ref 3.5–5.1)
Sodium: 137 mmol/L (ref 135–145)

## 2022-12-03 LAB — CREATININE, SERUM
Creatinine, Ser: 0.87 mg/dL (ref 0.44–1.00)
GFR, Estimated: >60 mL/min (ref >60)

## 2022-12-03 LAB — GLUCOSE, CAPILLARY
Glucose-Capillary: 146 mg/dL — ABNORMAL HIGH (ref 70–99)
Glucose-Capillary: 192 mg/dL — ABNORMAL HIGH (ref 70–99)
Glucose-Capillary: 227 mg/dL — ABNORMAL HIGH (ref 70–99)

## 2022-12-03 LAB — TROPONIN I (HIGH SENSITIVITY)
Troponin I (High Sensitivity): 134 ng/L (ref <18)
Troponin I (High Sensitivity): 332 ng/L (ref <18)
Troponin I (High Sensitivity): 1150 ng/L (ref <18)

## 2022-12-03 LAB — BRAIN NATRIURETIC PEPTIDE: B Natriuretic Peptide: 73.3 pg/mL (ref 0.0–100.0)

## 2022-12-03 LAB — CBG MONITORING, ED
Glucose-Capillary: 144 mg/dL — ABNORMAL HIGH (ref 70–99)
Glucose-Capillary: 137 mg/dL — ABNORMAL HIGH (ref 70–99)

## 2022-12-03 LAB — MAGNESIUM
Magnesium: 1.8 mg/dL (ref 1.7–2.4)
Magnesium: 1.6 mg/dL — ABNORMAL LOW (ref 1.7–2.4)

## 2022-12-03 SURGERY — LEFT HEART CATH AND CORS/GRAFTS ANGIOGRAPHY
Anesthesia: LOCAL

## 2022-12-03 MED ORDER — CLOPIDOGREL BISULFATE 300 MG PO TABS
ORAL_TABLET | ORAL | Status: DC | PRN
  Administered 2022-12-03: 75 mg via ORAL

## 2022-12-03 MED ORDER — HYDRALAZINE HCL 20 MG/ML IJ SOLN
INTRAMUSCULAR | Status: DC | PRN
  Administered 2022-12-03 (×2): 10 mg via INTRAVENOUS

## 2022-12-03 MED ORDER — ACETAMINOPHEN 325 MG PO TABS: 650.0000 mg | ORAL_TABLET | Freq: Four times a day (QID) | ORAL | Status: DC | PRN

## 2022-12-03 MED ORDER — PERFLUTREN LIPID MICROSPHERE
1.0000 mL | INTRAVENOUS | Status: DC | PRN
  Administered 2022-12-03: 3 mL via INTRAVENOUS

## 2022-12-03 MED ORDER — INSULIN GLARGINE-YFGN 100 UNIT/ML ~~LOC~~ SOLN
18.0000 [IU] | Freq: Every day | SUBCUTANEOUS | Status: DC
  Filled 2022-12-03 (×2): qty 0.18

## 2022-12-03 MED ORDER — VERAPAMIL HCL 2.5 MG/ML IV SOLN
INTRAVENOUS | Status: AC
  Filled 2022-12-03: qty 2

## 2022-12-03 MED ORDER — ONDANSETRON HCL 4 MG/2ML IJ SOLN
INTRAMUSCULAR | Status: AC
  Filled 2022-12-03: qty 2

## 2022-12-03 MED ORDER — ACETAMINOPHEN 650 MG RE SUPP: 650.0000 mg | Freq: Four times a day (QID) | RECTAL | Status: DC | PRN

## 2022-12-03 MED ORDER — PANTOPRAZOLE SODIUM 40 MG IV SOLR
40.0000 mg | INTRAVENOUS | Status: DC
  Administered 2022-12-03 – 2022-12-05 (×3): 40 mg via INTRAVENOUS
  Filled 2022-12-03 (×3): qty 10

## 2022-12-03 MED ORDER — HEPARIN SODIUM (PORCINE) 5000 UNIT/ML IJ SOLN
5000.0000 [IU] | Freq: Three times a day (TID) | INTRAMUSCULAR | Status: DC
  Administered 2022-12-04 – 2022-12-05 (×4): 5000 [IU] via SUBCUTANEOUS
  Filled 2022-12-03 (×4): qty 1

## 2022-12-03 MED ORDER — ORAL CARE MOUTH RINSE: 15.0000 mL | OROMUCOSAL | Status: DC | PRN

## 2022-12-03 MED ORDER — SODIUM CHLORIDE 0.9% FLUSH
3.0000 mL | Freq: Two times a day (BID) | INTRAVENOUS | Status: DC
  Administered 2022-12-03: 3 mL via INTRAVENOUS

## 2022-12-03 MED ORDER — NITROGLYCERIN 1 MG/10 ML FOR IR/CATH LAB
INTRA_ARTERIAL | Status: AC
  Filled 2022-12-03: qty 10

## 2022-12-03 MED ORDER — POTASSIUM CHLORIDE 10 MEQ/50ML IV SOLN
10.0000 meq | INTRAVENOUS | Status: AC
  Administered 2022-12-03 – 2022-12-04 (×4): 10 meq via INTRAVENOUS
  Filled 2022-12-03: qty 50

## 2022-12-03 MED ORDER — LABETALOL HCL 5 MG/ML IV SOLN
10.0000 mg | INTRAVENOUS | Status: AC | PRN
  Administered 2022-12-03 (×2): 10 mg via INTRAVENOUS
  Filled 2022-12-03 (×3): qty 4

## 2022-12-03 MED ORDER — CHLORHEXIDINE GLUCONATE CLOTH 2 % EX PADS
6.0000 | MEDICATED_PAD | Freq: Every day | CUTANEOUS | Status: DC
  Administered 2022-12-04: 6 via TOPICAL

## 2022-12-03 MED ORDER — ACETAMINOPHEN 325 MG PO TABS: 650.0000 mg | ORAL_TABLET | ORAL | Status: DC | PRN

## 2022-12-03 MED ORDER — ATROPINE SULFATE 1 MG/10ML IJ SOSY
PREFILLED_SYRINGE | INTRAMUSCULAR | Status: DC | PRN
  Administered 2022-12-03: 1 mg via INTRAVENOUS

## 2022-12-03 MED ORDER — VERAPAMIL HCL 2.5 MG/ML IV SOLN
INTRAVENOUS | Status: DC | PRN
  Administered 2022-12-03 (×2): 10 mL via INTRA_ARTERIAL

## 2022-12-03 MED ORDER — HEPARIN SODIUM (PORCINE) 1000 UNIT/ML IJ SOLN
INTRAMUSCULAR | Status: AC
  Filled 2022-12-03: qty 10

## 2022-12-03 MED ORDER — HEPARIN (PORCINE) 25000 UT/250ML-% IV SOLN
750.0000 [IU]/h | INTRAVENOUS | Status: DC
  Administered 2022-12-03: 750 [IU]/h via INTRAVENOUS
  Filled 2022-12-03: qty 250

## 2022-12-03 MED ORDER — NITROGLYCERIN IN D5W 200-5 MCG/ML-% IV SOLN
0.0000 ug/min | INTRAVENOUS | Status: DC
  Administered 2022-12-03 (×2): 5 ug/min via INTRAVENOUS
  Filled 2022-12-03 (×2): qty 250

## 2022-12-03 MED ORDER — SODIUM CHLORIDE 0.9 % IV SOLN: 250.0000 mL | INTRAVENOUS | Status: DC | PRN

## 2022-12-03 MED ORDER — CLOPIDOGREL BISULFATE 75 MG PO TABS
ORAL_TABLET | ORAL | Status: AC
  Filled 2022-12-03: qty 1

## 2022-12-03 MED ORDER — SODIUM CHLORIDE 0.9% FLUSH
3.0000 mL | Freq: Two times a day (BID) | INTRAVENOUS | Status: DC
  Administered 2022-12-03 – 2022-12-05 (×4): 3 mL via INTRAVENOUS

## 2022-12-03 MED ORDER — SODIUM CHLORIDE 0.9 % IV SOLN: INTRAVENOUS | Status: DC

## 2022-12-03 MED ORDER — HEPARIN BOLUS VIA INFUSION
3000.0000 [IU] | Freq: Once | INTRAVENOUS | Status: AC
  Administered 2022-12-03: 3000 [IU] via INTRAVENOUS
  Filled 2022-12-03: qty 3000

## 2022-12-03 MED ORDER — LIDOCAINE HCL (PF) 1 % IJ SOLN
INTRAMUSCULAR | Status: AC
  Filled 2022-12-03: qty 30

## 2022-12-03 MED ORDER — SODIUM CHLORIDE 0.9 % WEIGHT BASED INFUSION
3.0000 mL/kg/h | INTRAVENOUS | Status: DC
  Administered 2022-12-03: 3 mL/kg/h via INTRAVENOUS

## 2022-12-03 MED ORDER — NOREPINEPHRINE BITARTRATE 1 MG/ML IV SOLN
INTRAVENOUS | Status: DC | PRN
  Administered 2022-12-03: 5 ug/min via INTRAVENOUS

## 2022-12-03 MED ORDER — ONDANSETRON HCL 4 MG/2ML IJ SOLN
4.0000 mg | INTRAMUSCULAR | Status: DC | PRN
  Administered 2022-12-03 – 2022-12-04 (×3): 4 mg via INTRAVENOUS
  Filled 2022-12-03 (×3): qty 2

## 2022-12-03 MED ORDER — DOPAMINE-DEXTROSE 3.2-5 MG/ML-% IV SOLN
INTRAVENOUS | Status: AC
  Filled 2022-12-03: qty 250

## 2022-12-03 MED ORDER — POTASSIUM CHLORIDE 10 MEQ/100ML IV SOLN
10.0000 meq | INTRAVENOUS | Status: DC
  Administered 2022-12-03 (×3): 10 meq via INTRAVENOUS
  Filled 2022-12-03 (×4): qty 100

## 2022-12-03 MED ORDER — FENTANYL CITRATE (PF) 100 MCG/2ML IJ SOLN
INTRAMUSCULAR | Status: DC | PRN
  Administered 2022-12-03: 25 ug via INTRAVENOUS

## 2022-12-03 MED ORDER — NOREPINEPHRINE 4 MG/250ML-% IV SOLN
INTRAVENOUS | Status: AC
  Filled 2022-12-03: qty 250

## 2022-12-03 MED ORDER — IOHEXOL 350 MG/ML SOLN
INTRAVENOUS | Status: DC | PRN
  Administered 2022-12-03: 80 mL via INTRA_ARTERIAL

## 2022-12-03 MED ORDER — SODIUM CHLORIDE 0.9 % WEIGHT BASED INFUSION
3.0000 mL/kg/h | INTRAVENOUS | Status: DC
Start: 2022-12-04 — End: 2022-12-03

## 2022-12-03 MED ORDER — MAGNESIUM SULFATE 2 GM/50ML IV SOLN
2.0000 g | Freq: Once | INTRAVENOUS | Status: AC
  Administered 2022-12-03: 2 g via INTRAVENOUS
  Filled 2022-12-03: qty 50

## 2022-12-03 MED ORDER — SODIUM CHLORIDE 0.9 % IV SOLN
INTRAVENOUS | Status: AC | PRN
  Administered 2022-12-03: 500 mL via INTRAVENOUS

## 2022-12-03 MED ORDER — HYDRALAZINE HCL 20 MG/ML IJ SOLN: 10.0000 mg | INTRAMUSCULAR | Status: AC | PRN

## 2022-12-03 MED ORDER — SODIUM CHLORIDE 0.9% FLUSH: 3.0000 mL | INTRAVENOUS | Status: DC | PRN

## 2022-12-03 MED ORDER — INSULIN ASPART 100 UNIT/ML IJ SOLN
0.0000 [IU] | Freq: Four times a day (QID) | INTRAMUSCULAR | Status: DC
  Administered 2022-12-04 – 2022-12-05 (×5): 2 [IU] via SUBCUTANEOUS
  Administered 2022-12-05: 1 [IU] via SUBCUTANEOUS

## 2022-12-03 MED ORDER — MIDAZOLAM HCL 2 MG/2ML IJ SOLN
INTRAMUSCULAR | Status: AC
  Filled 2022-12-03: qty 2

## 2022-12-03 MED ORDER — ONDANSETRON HCL 4 MG/2ML IJ SOLN
INTRAMUSCULAR | Status: DC | PRN
  Administered 2022-12-03 (×2): 4 mg via INTRAVENOUS

## 2022-12-03 MED ORDER — HYDRALAZINE HCL 20 MG/ML IJ SOLN
INTRAMUSCULAR | Status: AC
  Filled 2022-12-03: qty 1

## 2022-12-03 MED ORDER — HEPARIN (PORCINE) IN NACL 1000-0.9 UT/500ML-% IV SOLN
INTRAVENOUS | Status: DC | PRN
  Administered 2022-12-03 (×2): 500 mL

## 2022-12-03 MED ORDER — NITROGLYCERIN 1 MG/10 ML FOR IR/CATH LAB
INTRA_ARTERIAL | Status: DC | PRN
  Administered 2022-12-03: 200 ug via INTRA_ARTERIAL

## 2022-12-03 MED ORDER — MIDAZOLAM HCL 2 MG/2ML IJ SOLN
INTRAMUSCULAR | Status: DC | PRN
  Administered 2022-12-03: 1 mg via INTRAVENOUS

## 2022-12-03 MED ORDER — ONDANSETRON HCL 4 MG/2ML IJ SOLN
4.0000 mg | Freq: Four times a day (QID) | INTRAMUSCULAR | Status: DC | PRN
  Administered 2022-12-03: 4 mg via INTRAVENOUS
  Filled 2022-12-03: qty 2

## 2022-12-03 MED ORDER — ATROPINE SULFATE 1 MG/10ML IJ SOSY
PREFILLED_SYRINGE | INTRAMUSCULAR | Status: AC
  Filled 2022-12-03: qty 10

## 2022-12-03 MED ORDER — MELATONIN 3 MG PO TABS: 3.0000 mg | ORAL_TABLET | Freq: Every evening | ORAL | Status: DC | PRN

## 2022-12-03 MED ORDER — LIDOCAINE HCL (PF) 1 % IJ SOLN
INTRAMUSCULAR | Status: DC | PRN
  Administered 2022-12-03 (×2): 2 mL
  Administered 2022-12-03: 15 mL
  Administered 2022-12-03: 2 mL

## 2022-12-03 MED ORDER — FENTANYL CITRATE (PF) 100 MCG/2ML IJ SOLN
INTRAMUSCULAR | Status: AC
  Filled 2022-12-03: qty 2

## 2022-12-03 MED ORDER — SODIUM CHLORIDE 0.9 % WEIGHT BASED INFUSION
1.0000 mL/kg/h | INTRAVENOUS | Status: DC
Start: 2022-12-04 — End: 2022-12-03

## 2022-12-03 MED ORDER — SODIUM CHLORIDE 0.9 % WEIGHT BASED INFUSION
1.0000 mL/kg/h | INTRAVENOUS | Status: DC
  Administered 2022-12-03: 1 mL/kg/h via INTRAVENOUS

## 2022-12-03 MED ORDER — ONDANSETRON HCL 4 MG/2ML IJ SOLN: 4.0000 mg | Freq: Four times a day (QID) | INTRAMUSCULAR | Status: DC | PRN

## 2022-12-03 SURGICAL SUPPLY — 26 items
CABLE ADAPT PACING TEMP 12FT (ADAPTER) IMPLANT
CATH EXPO 5F MPA-1 (CATHETERS) IMPLANT
CATH INFINITI 5 FR LCB (CATHETERS) IMPLANT
CATH INFINITI 5FR MULTPACK ANG (CATHETERS) IMPLANT
CATH LAUNCHER 6FR EBU3.5 (CATHETERS) IMPLANT
CATH OPTITORQUE TIG 4.0 5F (CATHETERS) IMPLANT
CLOSURE MYNX CONTROL 5F (Vascular Products) IMPLANT
DEVICE RAD COMP TR BAND LRG (VASCULAR PRODUCTS) IMPLANT
ELECT DEFIB PAD ADLT CADENCE (PAD) IMPLANT
GLIDESHEATH SLEND A-KIT 6F 22G (SHEATH) IMPLANT
GUIDEWIRE INQWIRE 1.5J.035X260 (WIRE) IMPLANT
GUIDEWIRE PRESSURE X 175 (WIRE) IMPLANT
INQWIRE 1.5J .035X260CM (WIRE) ×1
KIT HEART LEFT (KITS) ×1 IMPLANT
KIT HEMO VALVE WATCHDOG (MISCELLANEOUS) IMPLANT
MAT PREVALON FULL STRYKER (MISCELLANEOUS) IMPLANT
PACK CARDIAC CATHETERIZATION (CUSTOM PROCEDURE TRAY) ×1 IMPLANT
SHEATH PINNACLE 5F 10CM (SHEATH) IMPLANT
SHEATH PINNACLE 6F 10CM (SHEATH) IMPLANT
SHEATH PROBE COVER 6X72 (BAG) IMPLANT
SLEEVE REPOSITIONING LENGTH 30 (MISCELLANEOUS) IMPLANT
TRANSDUCER W/STOPCOCK (MISCELLANEOUS) ×1 IMPLANT
TUBING CIL FLEX 10 FLL-RA (TUBING) ×1 IMPLANT
WIRE EMERALD 3MM-J .035X150CM (WIRE) IMPLANT
WIRE MICRO SET SILHO 5FR 7 (SHEATH) IMPLANT
WIRE PACING TEMP ST TIP 5 (CATHETERS) IMPLANT

## 2022-12-03 NOTE — Consult Note (Signed)
CARDIOLOGY CONSULT NOTE  Patient ID: Susan Barnett MRN: [id number] DOB/AGE: 78/02/1946 76 y.o.  Admit date: 12/02/2022 Attending physician: Akula, Vijaya, MD Primary Physician:  Harris, William, MD Outpatient Cardiology Provider: Thomas Mabry, DO, FACC Inpatient Cardiologist: Jameson Tormey, DO, FACC  Reason of consultation: NSTEMI Referring physician: Dr. Mesner  Chief complaint: chest pain   HPI:  Susan Barnett is a 76 y.o. Caucasian female who presents with a chief complaint of " chest pain." Her past medical history and cardiovascular risk factors include: CAD status post CABG 2015 (SVG to diagonal and SVG to PDA), hypertension, diabetes mellitus type 2, hyperlipidemia, OSA.  She was referred to the practice on 11/30/2022 for stat consult regarding chest pain.  Her symptoms were concerning for angina pectoris was scheduled for an elective outpatient heart catheterization on 12/04/2022.  However, yesterday at 7 PM on October 03, 2022 she started experiencing substernal chest pain which she started describing as heartburn.  However the discomfort was not improving and radiated to the neck and throat and worsening with exertion.  She was concerned and called EMS who gave her 2 sublingual nitroglycerin tablets without any symptomatic relief and patient was brought to the ED for further evaluation and management.  EKG does not show findings of acute MI.  Symptoms appear to be cardiac in origin and high sensitive troponins are above normal limits.  Patient is ruled in for NSTEMI and cardiology was consulted for further evaluation and management.  ED provider had to place her on IV heparin and nitro drips.  After initiation of nitro drip her pain has resolved.  Her friend is present at bedside.  ALLERGIES: Allergies  Allergen Reactions   Asa [Aspirin] Anaphylaxis   Compazine [Prochlorperazine Edisylate] Swelling and Other (See Comments)    Tongue swelling    Nsaids Hives and Swelling    Shellfish Allergy Hives and Swelling   Sulfa Antibiotics Hives and Swelling   Zantac [Ranitidine Hcl] Hives and Swelling   Lisinopril Cough    PAST MEDICAL HISTORY: Past Medical History:  Diagnosis Date   Anemia    mild   Anginal pain    with exertion, last time prior to cardiac caht 06/02/14   Anxiety    situational   Arthritis    Breast cancer 2018   Left Breast Cancer   CAD (coronary artery disease)    Dr. H. Smith follows, no problems now   Cancer    Breast cancer   Complication of anesthesia    Diabetes mellitus, type 2    TYPE 2   Dysrhythmia    occ skips a beat, rate was fast before beta blocker.   Gastric polyp    Genetic testing 04/12/2017   Ms. Olenick underwent genetic counseling and testing for hereditary cancer syndromes on 02/19/2017. Her results were negative for mutations in all 46 genes analyzed by [facility]'s 46-gene Common Hereditary Cancers Panel. Genes analyzed include: APC, ATM, AXIN2, BARD1, BMPR1A, BRCA1, BRCA2, BRIP1, CDH1, CDKN2A, CHEK2, CTNNA1, DICER1, EPCAM, GREM1, HOXB13, KIT, MEN1, MLH1, MSH2, MSH3, MSH6, MUTYH, NBN   GERD (gastroesophageal reflux disease)    Headache    hx of   Heartburn    Hiatal hernia    mild head tremors   History of radiation therapy 01/30/17- 02/21/17   Left Breast 42.56 Gy in 16 fractions   Hypertension    Hypothyroidism    Occasional tremors    "of head", slight hands   Osteopenia    Personal history of radiation   therapy 2018   Left Breast Cancer   Pneumonia    PONV (postoperative nausea and vomiting) 02/24/14   Shortness of breath    with exertion   Sleep apnea    cpap used nightly x 2 yrs now.    PAST SURGICAL HISTORY: Past Surgical History:  Procedure Laterality Date   BREAST BIOPSY Left 1987   BREAST BIOPSY Left 2019   BREAST LUMPECTOMY Left 01/25/2017   BREAST LUMPECTOMY WITH RADIOACTIVE SEED AND SENTINEL LYMPH NODE BIOPSY Left 12/25/2016   Procedure: BREAST LUMPECTOMY WITH RADIOACTIVE SEED AND SENTINEL  LYMPH NODE BIOPSY;  Surgeon: Hoxworth, Benjamin, MD;  Location: [facility] OR;  Service: General;  Laterality: Left;   CARDIAC CATHETERIZATION  06/02/2014   LAD 30%, D1 80%, CFX 755, RCA > 95% ISR, rx w/ PTCA, heavy calcification, EF 65%   CORONARY ARTERY BYPASS GRAFT N/A 06/14/2014   Procedure: CORONARY ARTERY BYPASS GRAFTING (CABG);  Surgeon: Bryan K Bartle, MD;  Location: [facility] OR;  Service: Open Heart Surgery;  Laterality: N/A;  Times 2 using endoscopically harvested right saphenous vein.   coronary stents     x3 stents placed-prior ablation   DILATION AND CURETTAGE OF UTERUS     ELBOW FRACTURE SURGERY Left    INCONTINENCE SURGERY     sling   KNEE ARTHROSCOPY Right 02/24/2014   Procedure: RIGHT KNEE ARTHROSCOPY WITH DEBRIDEMENT ;  Surgeon: Frank Aluisio V, MD;  Location: [facility];  Service: Orthopedics;  Laterality: Right;   LEFT HEART CATHETERIZATION WITH CORONARY ANGIOGRAM N/A 06/02/2014   Procedure: LEFT HEART CATHETERIZATION WITH CORONARY ANGIOGRAM;  Surgeon: Henry W Smith III, MD;  Location: [facility] CATH [facility];  Service: Cardiovascular;  Laterality: N/A;   TEE WITHOUT CARDIOVERSION N/A 06/14/2014   Procedure: TRANSESOPHAGEAL ECHOCARDIOGRAM (TEE);  Surgeon: Bryan K Bartle, MD;  Location: [facility] OR;  Service: Open Heart Surgery;  Laterality: N/A;    FAMILY HISTORY: The patient's family history includes Breast cancer (age of onset: 30) in her cousin; Diabetes in her mother; Healthy in her brother; Heart attack in her mother; Hodgkin's lymphoma in her maternal uncle; Hypertension in her mother; Lung cancer in her brother; Ulcerative colitis in her daughter.   SOCIAL HISTORY:  The patient  reports that she has never smoked. She has never used smokeless tobacco. She reports that she does not drink alcohol and does not use drugs.  MEDICATIONS: Current Outpatient Medications  Medication Instructions   acetaminophen (TYLENOL) 1,300 mg, Oral, 2 times daily   Align 4 mg, Oral, Daily   amLODipine (NORVASC) 5 mg,  Oral, Daily   atorvastatin (LIPITOR) 10 mg, Oral, Every evening   CALCIUM PO 1 tablet, 2 times daily   Cetirizine HCl (ZYRTEC ALLERGY) 10 MG CAPS 1 tablet, Oral, Daily   cloNIDine (CATAPRES) 0.1 MG tablet TAKE ONE TABLET BY MOUTH EVERY MORNING and TAKE ONE TABLET BY MOUTH EVERY EVENING   clopidogrel (PLAVIX) 75 mg, Oral, Daily   Coenzyme Q10 300 mg, Oral, 2 times daily   D-Mannose 500 mg, Oral, 2 times daily   hydrochlorothiazide (HYDRODIURIL) 25 mg, Oral, Every morning   isosorbide mononitrate (IMDUR) 30 mg, Oral, Daily at 10 pm   Lantus SoloStar 40 Units, Subcutaneous, Daily at bedtime   levothyroxine (SYNTHROID) 75 mcg, Oral, Daily before breakfast   losartan (COZAAR) 100 mg, Oral, Daily   metoprolol tartrate (LOPRESSOR) 50 MG tablet TAKE TWO TABLETS BY MOUTH EVERY MORNING and TAKE TWO TABLETS BY MOUTH EVERY EVENING   miconazole (ANTIFUNGAL) 2 % powder 1   Application, As needed   Multiple Vitamin (MULTIVITAMIN ADULT PO) 1 tablet, Oral, Daily   nitroGLYCERIN (NITROSTAT) 0.4 mg, Sublingual, Every 5 min PRN   ONETOUCH VERIO test strip 1 each, 2 times daily   pantoprazole (PROTONIX) 40 mg, Oral, Daily before breakfast   sitaGLIPtin-metformin (JANUMET) 50-1000 MG tablet 1 tablet, Oral, 2 times daily with meals    REVIEW OF SYSTEMS: Review of Systems  Cardiovascular:  Positive for chest pain. Negative for claudication, dyspnea on exertion, irregular heartbeat, leg swelling, near-syncope, orthopnea, palpitations, paroxysmal nocturnal dyspnea and syncope.  Respiratory:  Negative for shortness of breath.   Hematologic/Lymphatic: Negative for bleeding problem.  Musculoskeletal:  Negative for muscle cramps and myalgias.  Neurological:  Negative for dizziness and light-headedness.    PHYSICAL EXAMINATION: PHYSICAL EXAM: Temp:  [98.2 F (36.8 C)-98.5 F (36.9 C)] 98.2 F (36.8 C) (04/22 0922) Pulse Rate:  [58-78] 69 (04/22 0900) Resp:  [13-21] 15 (04/22 0900) BP: (96-178)/(49-72) 123/57  (04/22 0900) SpO2:  [96 %-100 %] 97 % (04/22 0900) Weight:  [68 kg] 68 kg (04/22 0433)  Intake/Output:  Intake/Output Summary (Last 24 hours) at 12/03/2022 0935 Last data filed at 12/03/2022 0855 Gross per 24 hour  Intake 100 ml  Output --  Net 100 ml     Net IO Since Admission: 100 mL [12/03/22 0935]  Weights:     12/03/2022    4:33 AM 11/30/2022   10:17 AM 01/04/2022    4:20 PM  Last 3 Weights  Weight (lbs) 150 lb 155 lb 155 lb 12.8 oz  Weight (kg) 68.04 kg 70.308 kg 70.67 kg     Physical Exam  Constitutional: No distress.  Age appropriate, hemodynamically stable.   Neck: No JVD present.  Cardiovascular: Normal rate, regular rhythm, S1 normal, S2 normal, intact distal pulses and normal pulses. Exam reveals no gallop, no S3 and no S4.  No murmur heard. Pulmonary/Chest: Effort normal and breath sounds normal. No stridor. She has no wheezes. She has no rales.  Abdominal: Soft. Bowel sounds are normal. She exhibits no distension. There is no abdominal tenderness.  Musculoskeletal:        General: No edema.     Cervical back: Neck supple.  Neurological: She is alert and oriented to person, place, and time. She has intact cranial nerves (2-12).  Skin: Skin is warm and moist.    LAB RESULTS: Chemistry Recent Labs  Lab 11/30/22 1154 12/03/22 0012 12/03/22 0540  NA 138 135 137  K 4.3 3.2* 3.3*  CL 98 101 102  CO2 25 24 27  GLUCOSE 313* 213* 146*  BUN 15 17 16  CREATININE 0.69 0.87 0.81  CALCIUM 10.8* 8.6* 9.3  PROT  --  6.2* 6.7  ALBUMIN  --  3.3* 3.5  AST  --  21 25  ALT  --  16 18  ALKPHOS  --  44 40  BILITOT  --  0.4 0.6  GFRNONAA  --  >60 >60  ANIONGAP  --  10 8    Hematology Recent Labs  Lab 11/30/22 1154 12/03/22 0012 12/03/22 0540  WBC 10.9* 11.5* 12.5*  RBC 4.56 4.18 4.14  HGB 13.2 12.5 12.1  HCT 40.5 37.2 36.8  MCV 89 89.0 88.9  MCH 28.9 29.9 29.2  MCHC 32.6 33.6 32.9  RDW 13.0 13.0 13.0  PLT 313 245 271   High Sensitivity Troponin:    Recent Labs  Lab 12/03/22 0012 12/03/22 0253 12/03/22 0800  TROPONINIHS 134* 332* 1,150*       Cardiac EnzymesNo results for input(s): "TROPONINI" in the last 168 hours. No results for input(s): "TROPIPOC" in the last 168 hours.  BNP Recent Labs  Lab 11/30/22 1154 12/03/22 0012  BNP  --  73.3  PROBNP 793*  --     DDimer No results for input(s): "DDIMER" in the last 168 hours.  Hemoglobin A1c:  Lab Results  Component Value Date   HGBA1C 8.7 (H) 12/03/2022   MPG 202.99 12/03/2022   TSH  Recent Labs    12/03/22 0540  TSH 0.381   Lipid Panel  Lab Results  Component Value Date   CHOL  08/13/2007    116        ATP III CLASSIFICATION:  <200     mg/dL   Desirable  200-239  mg/dL   Borderline High  >=240    mg/dL   High   HDL 41 08/13/2007   LDLCALC  08/13/2007    61        Total Cholesterol/HDL:CHD Risk Coronary Heart Disease Risk Table                     Men   Women  1/2 Average Risk   3.4   3.3   TRIG 69 08/13/2007   CHOLHDL 2.8 08/13/2007   Drugs of Abuse  No results found for: "LABOPIA", "COCAINSCRNUR", "LABBENZ", "AMPHETMU", "THCU", "LABBARB"    CARDIAC DATABASE: EKG: November 30, 2022: Sinus rhythm, 75 bpm, normal axis, LAE, without underlying ischemia or injury pattern  December 02, 2022: Sinus rhythm, 71 bpm, subtle T wave abnormalities in the inferior leads consider ischemia   Echocardiogram: 09/01/2018: LVEF 55 to 60%, grade 2 diastolic dysfunction, trivial AR, mild MR, mild left atrial enlargement.   Stress Testing: MPI 05/18/2020            Nuclear stress EF: 72%. No wall motion abnormalities.            There was no ST segment deviation noted during stress.            The study is normal.            This is a low risk study. No evidence of ischemia or infarction. Prior CABG.   Heart Catheterization: None  Scheduled Meds:  insulin aspart  0-6 Units Subcutaneous Q6H   insulin glargine-yfgn  18 Units Subcutaneous QHS   pantoprazole (PROTONIX)  IV  40 mg Intravenous Q24H   sodium chloride flush  3 mL Intravenous Q12H    Continuous Infusions:  heparin 750 Units/hr (12/03/22 0415)   nitroGLYCERIN 45 mcg/min (12/03/22 0657)    PRN Meds: acetaminophen **OR** acetaminophen, melatonin, ondansetron (ZOFRAN) IV  IMPRESSION & RECOMMENDATIONS: Susan Barnett is a 76 y.o. Caucasian female whose past medical history and cardiovascular risk factors include: CAD status post CABG 2015 (SVG to diagonal and SVG to PDA), hypertension, diabetes mellitus type 2, hyperlipidemia, OSA..  Impression:  NSTEMI Coronary artery disease involving native vessels with angina pectoris History of CABG (SVG to diagonal, SVG to PDA) Hypertension. Insulin-dependent diabetes mellitus type 2 OSA. Diabetes with hyperlipidemia  Plan:  NSTEMI: Coronary artery disease involving native vessels with angina pectoris History of CABG (SVG to diagonal, SVG to PDA) Chest pain suggestive of angina pectoris. High sensitive troponins elevated. EKG does not illustrate acute injury pattern. Scheduled to have outpatient cath on 12/04/2022 presents to the ED for worsening discomfort Currently on IV heparin and nitro drips Allergic to aspirin. Currently on Plavix. Antianginal therapies   at home included metoprolol, sublingual nitroglycerin tablet, Imdur, and Norvasc. Echo will be ordered to evaluate for structural heart disease and left ventricular systolic function. She decision was to proceed with left heart catheterization with grafts with possible intervention.  Risks, benefits, and alternatives discussed on Friday, 11/30/2022.  No new questions or concerns.   Cath orders placed.  Of note, patient has history of breast cancer and therefore we will need to do a heart catheterization via the right femoral artery approach. Further recommendations pending.  Benign essential hypertension: At the last office visit the blood pressures were not  well-controlled. Antihypertensive medications were uptitrated. On arrival her blood pressures were not optimal either. Currently well-controlled on nitro drip. Will need to uptitrate medical therapy once she is off the nitro drip as this may also be contributing to her discomfort. Not a great candidate for SGLT2 inhibitors in the setting of diabetes given her history of yeast infections and UTIs.  Type 2 diabetes with complications of hyperglycemia and hyperlipidemia. Currently being managed by Dr. South Currently on ARB, statin therapy, not a candidate for SGLT2 inhibitors for reasons mentioned above. Recommend goal LDL <55 mg/dL.  CRITICAL CARE Performed by: Anguel Delapena   Total critical care time: 40 minutes   Critical care time was exclusive of separately billable procedures and treating other patients.   Critical care was necessary to treat or prevent imminent or life-threatening deterioration.   Critical care was time spent personally by me on the following activities: development of treatment plan with patient and friend at bedside, discussions with ED provider over the phone, initiating IV Heparin for ACS and nitro gtt, evaluation of patient's response to treatment, examination of patient, obtaining history from patient or surrogate, ordering and performing treatments and interventions, ordering and review of laboratory studies, ordering and review of radiographic studies, pulse oximetry and re-evaluation of patient's condition.  This note was created using a voice recognition software as a result there may be grammatical errors inadvertently enclosed that do not reflect the nature of this encounter. Every attempt is made to correct such errors.  Pinky Ravan, DO, FACC  Pager:  [phone] Office: [phone] 12/03/2022, 9:35 AM  

## 2022-12-03 NOTE — Consult Note (Signed)
Neurology Consultation  Reason for Consult: Code Stroke Referring Physician: Dr. Rosemary Holms  CC: Patient is unable to provide chief complaint due to confusion  History is obtained from: Chart review, cardiologist, unable to obtain from patient due to confusion  HPI: Susan Barnett is a 77 y.o. female with a medical history significant for DM2, ACS s/p CABG and multiple stents, breast cancer s/p radiation, GERD, OSA on CPAP, anemia, anxiety, and CAD who presented to the ED on 12/02/2022 with chest tightness for 1 week.  Workup in the ED revealed elevated troponin consistent with an NSTEMI.  Heparin-triglycerides were started and patient was taken to Cath Lab today on 12/03/2022.  Just prior to cardiac catheterization, patient's blood pressure was noted to be 200 systolic.  During catheterization patient complains of nausea and back pain and she was given hydralazine, Versed, and fentanyl.  During catheterization, patient went into complete heart block and her systolic blood pressure decreased into the 70s for approximately 10 minutes.  Patient was placed on norepinephrine and a temporary pacemaker was placed with improvement in blood pressure.  Following stabilization of patient's vital signs, patient was noted to not follow commands as well as she had previously.  Due to concern for potential complications from dramatic blood pressure changes, a code stroke was activated for further neurology evaluation.  LKW: 17:30 TNK given?: no, patient on IV heparin gtt today until 16:30, recent cardiac catheterization. Initial evaluation is nonfocal for neurologic deficits.  IR Thrombectomy? No, presentation is not consistent with LVO Modified Rankin Scale: 0-Completely asymptomatic and back to baseline post- stroke  ROS: Unable to obtain due to altered mental status with confusion.  Past Medical History:  Diagnosis Date   Anemia    mild   Anginal pain    with exertion, last time prior to cardiac caht  06/02/14   Anxiety    situational   Arthritis    Breast cancer 2018   Left Breast Cancer   CAD (coronary artery disease)    Dr. Mendel Ryder follows, no problems now   Cancer    Breast cancer   Complication of anesthesia    Diabetes mellitus, type 2    TYPE 2   Dysrhythmia    occ skips a beat, rate was fast before beta blocker.   Gastric polyp    Genetic testing 04/12/2017   Ms. Easterday underwent genetic counseling and testing for hereditary cancer syndromes on 02/19/2017. Her results were negative for mutations in all 46 genes analyzed by [facility]'s 46-gene Common Hereditary Cancers Panel. Genes analyzed include: APC, ATM, AXIN2, BARD1, BMPR1A, BRCA1, BRCA2, BRIP1, CDH1, CDKN2A, CHEK2, CTNNA1, DICER1, EPCAM, GREM1, HOXB13, KIT, MEN1, MLH1, MSH2, MSH3, MSH6, MUTYH, NBN   GERD (gastroesophageal reflux disease)    Headache    hx of   Heartburn    Hiatal hernia    mild head tremors   History of radiation therapy 01/30/17- 02/21/17   Left Breast 42.56 Gy in 16 fractions   Hypertension    Hypothyroidism    Occasional tremors    "of head", slight hands   Osteopenia    Personal history of radiation therapy 2018   Left Breast Cancer   Pneumonia    PONV (postoperative nausea and vomiting) 02/24/14   Shortness of breath    with exertion   Sleep apnea    cpap used nightly x 2 yrs now.   Past Surgical History:  Procedure Laterality Date   BREAST BIOPSY Left 1987   BREAST BIOPSY  Left 2019   BREAST LUMPECTOMY Left 01/25/2017   BREAST LUMPECTOMY WITH RADIOACTIVE SEED AND SENTINEL LYMPH NODE BIOPSY Left 12/25/2016   Procedure: BREAST LUMPECTOMY WITH RADIOACTIVE SEED AND SENTINEL LYMPH NODE BIOPSY;  Surgeon: Glenna Fellows, MD;  Location: [facility] OR;  Service: General;  Laterality: Left;   CARDIAC CATHETERIZATION  06/02/2014   LAD 30%, D1 80%, CFX 755, RCA > 95% ISR, rx w/ PTCA, heavy calcification, EF 65%   CORONARY ARTERY BYPASS GRAFT N/A 06/14/2014   Procedure: CORONARY ARTERY BYPASS GRAFTING  (CABG);  Surgeon: Alleen Borne, MD;  Location: [facility] OR;  Service: Open Heart Surgery;  Laterality: N/A;  Times 2 using endoscopically harvested right saphenous vein.   coronary stents     x3 stents placed-prior ablation   DILATION AND CURETTAGE OF UTERUS     ELBOW FRACTURE SURGERY Left    INCONTINENCE SURGERY     sling   KNEE ARTHROSCOPY Right 02/24/2014   Procedure: RIGHT KNEE ARTHROSCOPY WITH DEBRIDEMENT ;  Surgeon: Loanne Drilling, MD;  Location: [facility] ORS;  Service: Orthopedics;  Laterality: Right;   LEFT HEART CATHETERIZATION WITH CORONARY ANGIOGRAM N/A 06/02/2014   Procedure: LEFT HEART CATHETERIZATION WITH CORONARY ANGIOGRAM;  Surgeon: Lesleigh Noe, MD;  Location: [facility] CATH LAB;  Service: Cardiovascular;  Laterality: N/A;   TEE WITHOUT CARDIOVERSION N/A 06/14/2014   Procedure: TRANSESOPHAGEAL ECHOCARDIOGRAM (TEE);  Surgeon: Alleen Borne, MD;  Location: [facility] OR;  Service: Open Heart Surgery;  Laterality: N/A;   Family History  Problem Relation Age of Onset   Heart attack Mother    Hypertension Mother    Diabetes Mother    Lung cancer Brother        d.57 from metastatic disease. History of smoking.   Healthy Brother    Hodgkin's lymphoma Maternal Uncle        d.62s   Ulcerative colitis Daughter    Breast cancer Cousin 30       d.30s   Social History:   reports that she has never smoked. She has never used smokeless tobacco. She reports that she does not drink alcohol and does not use drugs.  Medications  Current Facility-Administered Medications:    0.9 %  sodium chloride infusion, , , Continuous PRN, Patwardhan, Manish J, MD, 500 mL at 12/03/22 1717   [EXPIRED] 0.9% sodium chloride infusion, 3 mL/kg/hr, Intravenous, Continuous, Stopped at 12/03/22 1229 **FOLLOWED BY** 0.9% sodium chloride infusion, 1 mL/kg/hr, Intravenous, Continuous, Yates Decamp, MD, Last Rate: 70.3 mL/hr at 12/03/22 1229, 1 mL/kg/hr at 12/03/22 1229   [MAR Hold] acetaminophen (TYLENOL) tablet 650 mg, 650 mg,  Oral, Q6H PRN **OR** [MAR Hold] acetaminophen (TYLENOL) suppository 650 mg, 650 mg, Rectal, Q6H PRN, Howerter, Justin B, DO   atropine 1 MG/10ML injection, , , PRN, Patwardhan, Manish J, MD, 1 mg at 12/03/22 1718   clopidogrel (PLAVIX) tablet, , , PRN, Patwardhan, Manish J, MD, 75 mg at 12/03/22 1605   fentaNYL (SUBLIMAZE) injection, , , PRN, Patwardhan, Manish J, MD, 25 mcg at 12/03/22 1628   Heparin (Porcine) in NaCl 1000-0.9 UT/500ML-% SOLN, , , PRN, Patwardhan, Manish J, MD, 500 mL at 12/03/22 1631   heparin ADULT infusion 100 units/mL (25000 units/234mL), 750 Units/hr, Intravenous, Continuous, Loretta Plume, Student-PharmD, Last Rate: 7.5 mL/hr at 12/03/22 1230, 750 Units/hr at 12/03/22 1230   hydrALAZINE (APRESOLINE) injection, , , PRN, Patwardhan, Manish J, MD, 10 mg at 12/03/22 1646   [MAR Hold] insulin aspart (novoLOG) injection 0-6 Units, 0-6 Units,  Subcutaneous, Q6H, Howerter, Justin B, DO   [MAR Hold] insulin glargine-yfgn (SEMGLEE) injection 18 Units, 18 Units, Subcutaneous, QHS, Howerter, Justin B, DO   lidocaine (PF) (XYLOCAINE) 1 % injection, , , PRN, Patwardhan, Manish J, MD, 2 mL at 12/03/22 1752   [MAR Hold] melatonin tablet 3 mg, 3 mg, Oral, QHS PRN, Howerter, Justin B, DO   midazolam (VERSED) injection, , , PRN, Patwardhan, Manish J, MD, 1 mg at 12/03/22 1628   nitroGLYCERIN 1 mg/10 mL (100 mcg/mL) - IR/CATH LAB, , , PRN, Patwardhan, Manish J, MD, 200 mcg at 12/03/22 1645   nitroGLYCERIN 100 mcg/mL intra-arterial injection, , , ,    [MAR Hold] nitroGLYCERIN 50 mg in dextrose 5 % 250 mL (0.2 mg/mL) infusion, 0-200 mcg/min, Intravenous, Titrated, Mesner, Barbara Cower, MD, Stopped at 12/03/22 1657   norepinephrine (LEVOPHED) 4 mg in dextrose 5 % 250 mL (0.016 mg/mL) infusion, , , Continuous PRN, Patwardhan, Anabel Bene, MD, Stopped at 12/03/22 1808   [MAR Hold] ondansetron (ZOFRAN) injection 4 mg, 4 mg, Intravenous, Q6H PRN, Howerter, Justin B, DO, 4 mg at 12/03/22 0651   ondansetron  (ZOFRAN) injection, , , PRN, Patwardhan, Manish J, MD, 4 mg at 12/03/22 1704   [MAR Hold] pantoprazole (PROTONIX) injection 40 mg, 40 mg, Intravenous, Q24H, Howerter, Justin B, DO, 40 mg at 12/03/22 0729   Radial Cocktail/Verapamil only, , , PRN, Patwardhan, Manish J, MD, 10 mL at 12/03/22 1639   [MAR Hold] sodium chloride flush (NS) 0.9 % injection 3 mL, 3 mL, Intravenous, Q12H, Tolia, Sunit, DO, 3 mL at 12/03/22 0417  Exam: Current vital signs: BP 130/62   Pulse 65   Temp 98.2 F (36.8 C) (Oral)   Resp 18   Ht 5\' 1"  (1.549 m)   Wt 68 kg   SpO2 95%   BMI 28.34 kg/m  Vital signs in last 24 hours: Temp:  [98.2 F (36.8 C)-98.5 F (36.9 C)] 98.2 F (36.8 C) (04/22 0922) Pulse Rate:  [58-78] 65 (04/22 1530) Resp:  [13-25] 18 (04/22 1530) BP: (96-178)/(49-96) 130/62 (04/22 1530) SpO2:  [94 %-100 %] 95 % (04/22 1624) Weight:  [68 kg] 68 kg (04/22 0433)  GENERAL: Drowsy, wakes to voice, somewhat restless requesting to sit up on Cath Lab table Psych: Affect appropriate for situation, she is confused and restless post cardiac catheterization  Head: Normocephalic and atraumatic, without obvious abnormality EENT: Normal conjunctivae, dry mucous membranes, no OP obstruction LUNGS: Normal respiratory effort. Non-labored breathing on nasal cannula CV: Complete heart block s/p temporary pacemaker placement ABDOMEN: Soft, non-tender, non-distended Extremities: Warm, well perfused, without obvious deformity  NEURO:  Mental Status: Patient is postoperative cardiac catheterization and does appear to be confused. She is able to state her name correctly after being asked multiple times by examiner. Initially, speech is garbled, slurred, and mumbled though her speech clears and is intelligible without evidence of aphasia with ongoing assessment. She incorrectly states that she is 77 years old.  She does not answer what the current month is. Patient perseverates on wanting to sit up. Patient  initially does not follow commands and is confused, with ongoing and repeat instruction, patient follows simple commands. Cranial Nerves:  II: PERRL.  III, IV, VI: Patient tracks examiner throughout V: Blinks to threat bilaterally VII: Face is symmetric resting and with movement VIII: Hearing is intact to voice IX, X: Phonation normal.  XI: Head is grossly midline XII: Tongue protrudes midline without fasciculations.   Motor: Patient is able to elevate bilateral  upper and lower extremities antigravity without unilateral weakness.  Patient does have poor concentration and attention and drops arms prior to for full 10 seconds.  Lower extremity antigravity strength somewhat limited due to right groin access site from cardiac catheterization. Tone is normal. Bulk is normal.  Sensation: Patient reacts to touch in all 4 extremities Coordination: Does not perform Gait: Deferred  NIHSS: 1a Level of Conscious.: 1 1b LOC Questions: 1 1c LOC Commands: 0 2 Best Gaze: 0 3 Visual: 0 4 Facial Palsy: 0 5a Motor Arm - left: 1 5b Motor Arm - Right: 1 6a Motor Leg - Left: 1 6b Motor Leg - Right: 1 7 Limb Ataxia: 0 8 Sensory: 0 9 Best Language: 1 (improved to 0 with reassessment) 10 Dysarthria: 1 (improved to 0 with reassessment) 11 Extinct. and Inatten.: 0 TOTAL: 8  Labs I have reviewed labs in epic and the results pertinent to this consultation are: CBC    Component Value Date/Time   WBC 12.5 (H) 12/03/2022 0540   RBC 4.14 12/03/2022 0540   HGB 12.1 12/03/2022 0540   HGB 13.2 11/30/2022 1154   HGB 9.9 (L) 02/19/2017 1409   HCT 36.8 12/03/2022 0540   HCT 40.5 11/30/2022 1154   HCT 32.5 (L) 02/19/2017 1409   PLT 271 12/03/2022 0540   PLT 313 11/30/2022 1154   MCV 88.9 12/03/2022 0540   MCV 89 11/30/2022 1154   MCV 71.9 (L) 02/19/2017 1409   MCH 29.2 12/03/2022 0540   MCHC 32.9 12/03/2022 0540   RDW 13.0 12/03/2022 0540   RDW 13.0 11/30/2022 1154   RDW 17.9 (H) 02/19/2017 1409    LYMPHSABS 2.8 12/03/2022 0540   LYMPHSABS 1.5 08/22/2018 1241   LYMPHSABS 1.1 02/19/2017 1409   MONOABS 0.9 12/03/2022 0540   MONOABS 0.7 02/19/2017 1409   EOSABS 0.7 (H) 12/03/2022 0540   EOSABS 0.9 (H) 08/22/2018 1241   BASOSABS 0.1 12/03/2022 0540   BASOSABS 0.1 08/22/2018 1241   BASOSABS 0.0 02/19/2017 1409   CMP     Component Value Date/Time   NA 137 12/03/2022 0540   NA 138 11/30/2022 1154   K 3.3 (L) 12/03/2022 0540   CL 102 12/03/2022 0540   CO2 27 12/03/2022 0540   GLUCOSE 146 (H) 12/03/2022 0540   BUN 16 12/03/2022 0540   BUN 15 11/30/2022 1154   CREATININE 0.81 12/03/2022 0540   CALCIUM 9.3 12/03/2022 0540   PROT 6.7 12/03/2022 0540   ALBUMIN 3.5 12/03/2022 0540   AST 25 12/03/2022 0540   ALT 18 12/03/2022 0540   ALKPHOS 40 12/03/2022 0540   BILITOT 0.6 12/03/2022 0540   GFRNONAA >60 12/03/2022 0540   GFRAA 112 06/20/2020 1435    Lipid Panel     Component Value Date/Time   CHOL  08/13/2007 0615    116        ATP III CLASSIFICATION:  <200     mg/dL   Desirable  409-811  mg/dL   Borderline High  >=914    mg/dL   High   TRIG 69 78/29/5621 0615   HDL 41 08/13/2007 0615   CHOLHDL 2.8 08/13/2007 0615   VLDL 14 08/13/2007 0615   LDLCALC  08/13/2007 0615    61        Total Cholesterol/HDL:CHD Risk Coronary Heart Disease Risk Table                     Men   Women  1/2 Average Risk  3.4   3.3   Imaging I have reviewed the images obtained:  CT-scan of the brain negative for any acute abnormality.  Assessment: 77 year old female with PMHx of breast cancer s/p radiation, anxiety, OSA on CPAP, DM2, ACS s/p CABG with multiple stents, GERD, CAD admitted for NSTEMI.  While in cardiac catheterization today, patient went into complete heart block with subsequent drop in systolic blood pressure from the 200s to the 70s for approximately 10 minutes.  Patient was then felt to be altered and was not following commands.  On neurology evaluation, patient did initially  struggle with following commands with some speech deficits though with reassessment, patient followed commands and her speech cleared.  Her examination was felt to be nonfocal and likely related to sedation and recent operation with low suspicion for acute ischemia.  Recommendations: - MRI brain without contrast when able to obtain  - If MRI brain is positive for acute ischemia, will expand stroke work up at that time  Pt seen by NP/Neuro and later by MD. Note/plan to be edited by MD as needed.  Lanae Boast, AGAC-NP [facility] Pager: [phone]   NEUROHOSPITALIST ADDENDUM Performed a face to face diagnostic evaluation.   I have reviewed the contents of history and physical exam as documented by PA/ARNP/Resident and agree with above documentation.  I have discussed and formulated the above plan as documented. Edits to the note have been made as needed.  Impression/Key exam findings/Plan: admitted for NSTEMI on heparin and was undergoing cardiac cath and went into complete heart block and a drop in her blood pressure from 200s SBP to 70s SBP. Felt to be confused acutely with concern for speech deficit out of proportion to confusion. A code stroke was activated. She is non focal on my exam and wakefullness improved with provocative maneuvers and more eye opening.  STAT CT Head with no ICH.  Plan is to get MRI Brain w/o contrast as she underwent high risk procedure and if negative, no further workup.  Erick Blinks, MD [facility] [phone]   If 7pm to 7am, please call on call as listed on AMION.

## 2022-12-03 NOTE — ED Notes (Addendum)
No changes, alert, NAD, calm, resting, family at [facility]

## 2022-12-03 NOTE — ED Notes (Addendum)
Cath lab here for pt. No changes. Alert, NAD, calm, interactive. Family at [facility]. Heparin and ntg still infusing.

## 2022-12-03 NOTE — ED Notes (Signed)
Pt alert, NAD, calm, interactive, resps e/u, speaking clearly. Friend at [facility]. Denies pain, nausea or sob at this time.

## 2022-12-03 NOTE — Progress Notes (Signed)
Pt. Refused cpap. Pt. States that wearing cpap affects her eyes and that is why she does not wear. RN aware.

## 2022-12-03 NOTE — ED Provider Notes (Signed)
[place] EMERGENCY DEPARTMENT AT [facility] Provider Note   CSN: [id number] Arrival date & time: 12/02/22  2335     History  Chief Complaint  Patient presents with   Chest Pain    Susan Barnett is a 77 y.o. female.  77 year old female with history of ACS status post CABG and multiple stents over the last decade or so the presents ER today with chest pain.  Patient states he has been having some intermittent episodes of chest pressure for couple weeks.  She saw her primary doctor.  They are trying to get her into see her neurologist however were not able to get her primary cardiologist and got an appointment at [facility] with Dr. Odis Hollingshead on Friday.  He scheduled her for cardiac catheterization on Tuesday.  Tonight she started having the same chest pain again that did not seem to get any better like it had been previously.  Called EMS.  She is little bit hypertensive but then they also gave her nitro and her chest pain improved with that.  She is allergic to aspirin. Took plavix as scheduled Sunday morning.    Chest Pain      Home Medications Prior to Admission medications   Medication Sig Start Date End Date Taking? Authorizing Provider  acetaminophen (TYLENOL) 650 MG CR tablet Take 1,300 mg by mouth in the morning and at bedtime.    [provider]  amLODipine (NORVASC) 5 MG tablet Take 5 mg by mouth daily.    [provider]  atorvastatin (LIPITOR) 10 MG tablet Take 10 mg by mouth every evening.     [provider]  CALCIUM PO Take 1 tablet by mouth in the morning and at bedtime.    [provider]  Cetirizine HCl (ZYRTEC ALLERGY) 10 MG CAPS Take 1 tablet by mouth daily. 07/06/19   [provider]  cloNIDine (CATAPRES) 0.1 MG tablet TAKE ONE TABLET BY MOUTH EVERY MORNING and TAKE ONE TABLET BY MOUTH EVERY EVENING 11/03/21   Lyn Records, MD  clopidogrel (PLAVIX) 75 MG tablet Take 75 mg by mouth daily.    [provider]  Coenzyme Q10 300 MG CAPS Take 300 mg by mouth 2 (two) times daily.    [provider]  D-Mannose 500 MG CAPS Take 500 mg by mouth in the morning and at bedtime.    [provider]  hydrochlorothiazide (HYDRODIURIL) 25 MG tablet Take 1 tablet (25 mg total) by mouth every morning. 11/30/22 02/28/23  Tolia, Sunit, DO  isosorbide mononitrate (IMDUR) 30 MG 24 hr tablet Take 1 tablet (30 mg total) by mouth daily at 10 pm. 11/30/22 02/28/23  Tolia, Sunit, DO  LANTUS SOLOSTAR 100 UNIT/ML Solostar Pen Inject 40 Units into the skin at bedtime. Patient taking differently: Inject 42 Units into the skin at bedtime. 05/23/18   Serena Croissant, MD  levothyroxine (SYNTHROID, LEVOTHROID) 75 MCG tablet Take 75 mcg by mouth daily before breakfast.     [provider]  losartan (COZAAR) 100 MG tablet Take 1 tablet (100 mg total) by mouth daily. 10/28/19   Lyn Records, MD  metoprolol tartrate (LOPRESSOR) 50 MG tablet TAKE TWO TABLETS BY MOUTH EVERY MORNING and TAKE TWO TABLETS BY MOUTH EVERY EVENING 05/24/22   Lyn Records, MD  miconazole (ANTIFUNGAL) 2 % powder Apply 1 Application topically as needed for itching.    [provider]  Multiple Vitamin (MULTIVITAMIN ADULT PO) Take 1 tablet by mouth daily.  [provider]  nitroGLYCERIN (NITROSTAT) 0.4 MG SL tablet Place 1 tablet (0.4 mg total) under the tongue every 5 (five) minutes as needed for chest pain. 11/30/22 02/28/23  Tolia, Sunit, DO  ONETOUCH VERIO test strip 1 each 2 (two) times daily. 04/18/20   [provider]  pantoprazole (PROTONIX) 40 MG tablet Take 1 tablet (40 mg total) by mouth daily before breakfast. 09/08/14   Lyn Records, MD  Probiotic Product (ALIGN) 4 MG CAPS Take 4 mg by mouth daily.    [provider]  sitaGLIPtin-metformin (JANUMET) 50-1000 MG tablet Take 1 tablet by mouth 2 (two) times daily with a meal.    [provider]      Allergies    Asa [aspirin],  Compazine [prochlorperazine edisylate], Nsaids, Shellfish allergy, Sulfa antibiotics, Zantac [ranitidine hcl], and Lisinopril    Review of Systems   Review of Systems  Cardiovascular:  Positive for chest pain.    Physical Exam Updated Vital Signs BP (!) 168/72   Pulse 78   Temp 98.4 F (36.9 C) (Oral)   Resp 16   SpO2 98%  Physical Exam Vitals and nursing note reviewed.  Constitutional:      Appearance: She is well-developed.  HENT:     Head: Normocephalic and atraumatic.  Cardiovascular:     Rate and Rhythm: Normal rate and regular rhythm.  Pulmonary:     Effort: No respiratory distress.     Breath sounds: No stridor.  Abdominal:     General: There is no distension.  Musculoskeletal:        General: Normal range of motion.     Cervical back: Normal range of motion.     Right lower leg: No edema.     Left lower leg: No edema.  Neurological:     Mental Status: She is alert.     ED Results / Procedures / Treatments   Labs (all labs ordered are listed, but only abnormal results are displayed) Labs Reviewed  CBC WITH DIFFERENTIAL/PLATELET - Abnormal; Notable for the following components:      Result Value   WBC 11.5 (*)    Neutro Abs 8.3 (*)    All other components within normal limits  COMPREHENSIVE METABOLIC PANEL - Abnormal; Notable for the following components:   Potassium 3.2 (*)    Glucose, Bld 213 (*)    Calcium 8.6 (*)    Total Protein 6.2 (*)    Albumin 3.3 (*)    All other components within normal limits  TROPONIN I (HIGH SENSITIVITY) - Abnormal; Notable for the following components:   Troponin I (High Sensitivity) 134 (*)    All other components within normal limits  BRAIN NATRIURETIC PEPTIDE  TROPONIN I (HIGH SENSITIVITY)    EKG None  Radiology DG Chest Portable 1 View  Result Date: 12/03/2022 CLINICAL DATA:  Chest pain. EXAM: PORTABLE CHEST 1 VIEW COMPARISON:  Dec 17, 2025 FINDINGS: Multiple sternal wires are noted. The heart size and  mediastinal contours are within normal limits. There is moderate severity calcification of the aortic arch and tortuosity of the descending thoracic aorta. Low lung volumes are seen. Both lungs are clear. The visualized skeletal structures are unremarkable. IMPRESSION: 1. Evidence of prior median sternotomy/CABG. 2. No acute cardiopulmonary disease. Electronically Signed   By: Aram Candela M.D.   On: 12/03/2022 00:04    Procedures .Critical Care  Performed by: Marily Memos, MD Authorized by: Marily Memos, MD   Critical care provider statement:  Critical care time (minutes):  30   Critical care was necessary to treat or prevent imminent or life-threatening deterioration of the following conditions:  Cardiac failure and circulatory failure   Critical care was time spent personally by me on the following activities:  Development of treatment plan with patient or surrogate, discussions with consultants, evaluation of patient's response to treatment, examination of patient, ordering and review of laboratory studies, ordering and review of radiographic studies, ordering and performing treatments and interventions, pulse oximetry, re-evaluation of patient's condition and review of old charts     Medications Ordered in ED Medications  nitroGLYCERIN 50 mg in dextrose 5 % 250 mL (0.2 mg/mL) infusion (has no administration in time range)    ED Course/ Medical Decision Making/ A&P                             Medical Decision Making Amount and/or Complexity of Data Reviewed Labs: ordered. Radiology: ordered.  Risk Prescription drug management. Decision regarding hospitalization.   Patient with initial stomach unstable angina cardiac workup initiated EKG is negative.  First troponin is 134 consistent with an NSTEMI. Heparin and NTG started.  I discussed with her cardiologist, Dr. Odis Hollingshead, prefers medicine admission with her elevated BP, but to keep NPO in case of cath in AM.  I discussed  with [facility] for admission.   Final Clinical Impression(s) / ED Diagnoses Final diagnoses:  NSTEMI (non-ST elevated myocardial infarction)    Rx / DC Orders ED Discharge Orders     None         Suede Greenawalt, Barbara Cower, MD 12/03/22 [phone]

## 2022-12-03 NOTE — H&P (View-Only) (Signed)
CARDIOLOGY CONSULT NOTE  Patient ID: Susan Barnett MRN: [id number] DOB/AGE: 08-14-1945 77 y.o.  Admit date: 12/02/2022 Attending physician: Kathlen Mody, MD Primary Physician:  Johny Blamer, MD Outpatient Cardiology Provider: Tessa Lerner, DO, [facility] Inpatient Cardiologist: Tessa Lerner, DO, [facility]  Reason of consultation: NSTEMI Referring physician: Dr. Clayborne Dana  Chief complaint: chest pain   HPI:  Susan Barnett is a 77 y.o. Caucasian female who presents with a chief complaint of " chest pain." Her past medical history and cardiovascular risk factors include: CAD status post CABG 2015 (SVG to diagonal and SVG to PDA), hypertension, diabetes mellitus type 2, hyperlipidemia, OSA.  She was referred to the practice on 11/30/2022 for stat consult regarding chest pain.  Her symptoms were concerning for angina pectoris was scheduled for an elective outpatient heart catheterization on 12/04/2022.  However, yesterday at 7 PM on October 03, 2022 she started experiencing substernal chest pain which she started describing as heartburn.  However the discomfort was not improving and radiated to the neck and throat and worsening with exertion.  She was concerned and called EMS who gave her 2 sublingual nitroglycerin tablets without any symptomatic relief and patient was brought to the ED for further evaluation and management.  EKG does not show findings of acute MI.  Symptoms appear to be cardiac in origin and high sensitive troponins are above normal limits.  Patient is ruled in for NSTEMI and cardiology was consulted for further evaluation and management.  ED provider had to place her on IV heparin and nitro drips.  After initiation of nitro drip her pain has resolved.  Her friend is present at bedside.  ALLERGIES: Allergies  Allergen Reactions   Asa [Aspirin] Anaphylaxis   Compazine [Prochlorperazine Edisylate] Swelling and Other (See Comments)    Tongue swelling    Nsaids Hives and Swelling    Shellfish Allergy Hives and Swelling   Sulfa Antibiotics Hives and Swelling   Zantac [Ranitidine Hcl] Hives and Swelling   Lisinopril Cough    PAST MEDICAL HISTORY: Past Medical History:  Diagnosis Date   Anemia    mild   Anginal pain    with exertion, last time prior to cardiac caht 06/02/14   Anxiety    situational   Arthritis    Breast cancer 2018   Left Breast Cancer   CAD (coronary artery disease)    Dr. Mendel Ryder follows, no problems now   Cancer    Breast cancer   Complication of anesthesia    Diabetes mellitus, type 2    TYPE 2   Dysrhythmia    occ skips a beat, rate was fast before beta blocker.   Gastric polyp    Genetic testing 04/12/2017   Ms. Medders underwent genetic counseling and testing for hereditary cancer syndromes on 02/19/2017. Her results were negative for mutations in all 46 genes analyzed by [facility]'s 46-gene Common Hereditary Cancers Panel. Genes analyzed include: APC, ATM, AXIN2, BARD1, BMPR1A, BRCA1, BRCA2, BRIP1, CDH1, CDKN2A, CHEK2, CTNNA1, DICER1, EPCAM, GREM1, HOXB13, KIT, MEN1, MLH1, MSH2, MSH3, MSH6, MUTYH, NBN   GERD (gastroesophageal reflux disease)    Headache    hx of   Heartburn    Hiatal hernia    mild head tremors   History of radiation therapy 01/30/17- 02/21/17   Left Breast 42.56 Gy in 16 fractions   Hypertension    Hypothyroidism    Occasional tremors    "of head", slight hands   Osteopenia    Personal history of radiation  therapy 2018   Left Breast Cancer   Pneumonia    PONV (postoperative nausea and vomiting) 02/24/14   Shortness of breath    with exertion   Sleep apnea    cpap used nightly x 2 yrs now.    PAST SURGICAL HISTORY: Past Surgical History:  Procedure Laterality Date   BREAST BIOPSY Left 1987   BREAST BIOPSY Left 2019   BREAST LUMPECTOMY Left 01/25/2017   BREAST LUMPECTOMY WITH RADIOACTIVE SEED AND SENTINEL LYMPH NODE BIOPSY Left 12/25/2016   Procedure: BREAST LUMPECTOMY WITH RADIOACTIVE SEED AND SENTINEL  LYMPH NODE BIOPSY;  Surgeon: Glenna Fellows, MD;  Location: [facility] OR;  Service: General;  Laterality: Left;   CARDIAC CATHETERIZATION  06/02/2014   LAD 30%, D1 80%, CFX 755, RCA > 95% ISR, rx w/ PTCA, heavy calcification, EF 65%   CORONARY ARTERY BYPASS GRAFT N/A 06/14/2014   Procedure: CORONARY ARTERY BYPASS GRAFTING (CABG);  Surgeon: Alleen Borne, MD;  Location: [facility] OR;  Service: Open Heart Surgery;  Laterality: N/A;  Times 2 using endoscopically harvested right saphenous vein.   coronary stents     x3 stents placed-prior ablation   DILATION AND CURETTAGE OF UTERUS     ELBOW FRACTURE SURGERY Left    INCONTINENCE SURGERY     sling   KNEE ARTHROSCOPY Right 02/24/2014   Procedure: RIGHT KNEE ARTHROSCOPY WITH DEBRIDEMENT ;  Surgeon: Loanne Drilling, MD;  Location: [facility] ORS;  Service: Orthopedics;  Laterality: Right;   LEFT HEART CATHETERIZATION WITH CORONARY ANGIOGRAM N/A 06/02/2014   Procedure: LEFT HEART CATHETERIZATION WITH CORONARY ANGIOGRAM;  Surgeon: Lesleigh Noe, MD;  Location: [facility] CATH LAB;  Service: Cardiovascular;  Laterality: N/A;   TEE WITHOUT CARDIOVERSION N/A 06/14/2014   Procedure: TRANSESOPHAGEAL ECHOCARDIOGRAM (TEE);  Surgeon: Alleen Borne, MD;  Location: [facility] OR;  Service: Open Heart Surgery;  Laterality: N/A;    FAMILY HISTORY: The patient's family history includes Breast cancer (age of onset: 65) in her cousin; Diabetes in her mother; Healthy in her brother; Heart attack in her mother; Hodgkin's lymphoma in her maternal uncle; Hypertension in her mother; Lung cancer in her brother; Ulcerative colitis in her daughter.   SOCIAL HISTORY:  The patient  reports that she has never smoked. She has never used smokeless tobacco. She reports that she does not drink alcohol and does not use drugs.  MEDICATIONS: Current Outpatient Medications  Medication Instructions   acetaminophen (TYLENOL) 1,300 mg, Oral, 2 times daily   Align 4 mg, Oral, Daily   amLODipine (NORVASC) 5 mg,  Oral, Daily   atorvastatin (LIPITOR) 10 mg, Oral, Every evening   CALCIUM PO 1 tablet, 2 times daily   Cetirizine HCl (ZYRTEC ALLERGY) 10 MG CAPS 1 tablet, Oral, Daily   cloNIDine (CATAPRES) 0.1 MG tablet TAKE ONE TABLET BY MOUTH EVERY MORNING and TAKE ONE TABLET BY MOUTH EVERY EVENING   clopidogrel (PLAVIX) 75 mg, Oral, Daily   Coenzyme Q10 300 mg, Oral, 2 times daily   D-Mannose 500 mg, Oral, 2 times daily   hydrochlorothiazide (HYDRODIURIL) 25 mg, Oral, Every morning   isosorbide mononitrate (IMDUR) 30 mg, Oral, Daily at 10 pm   Lantus SoloStar 40 Units, Subcutaneous, Daily at bedtime   levothyroxine (SYNTHROID) 75 mcg, Oral, Daily before breakfast   losartan (COZAAR) 100 mg, Oral, Daily   metoprolol tartrate (LOPRESSOR) 50 MG tablet TAKE TWO TABLETS BY MOUTH EVERY MORNING and TAKE TWO TABLETS BY MOUTH EVERY EVENING   miconazole (ANTIFUNGAL) 2 % powder 1  Application, As needed   Multiple Vitamin (MULTIVITAMIN ADULT PO) 1 tablet, Oral, Daily   nitroGLYCERIN (NITROSTAT) 0.4 mg, Sublingual, Every 5 min PRN   ONETOUCH VERIO test strip 1 each, 2 times daily   pantoprazole (PROTONIX) 40 mg, Oral, Daily before breakfast   sitaGLIPtin-metformin (JANUMET) 50-1000 MG tablet 1 tablet, Oral, 2 times daily with meals    REVIEW OF SYSTEMS: Review of Systems  Cardiovascular:  Positive for chest pain. Negative for claudication, dyspnea on exertion, irregular heartbeat, leg swelling, near-syncope, orthopnea, palpitations, paroxysmal nocturnal dyspnea and syncope.  Respiratory:  Negative for shortness of breath.   Hematologic/Lymphatic: Negative for bleeding problem.  Musculoskeletal:  Negative for muscle cramps and myalgias.  Neurological:  Negative for dizziness and light-headedness.    PHYSICAL EXAMINATION: PHYSICAL EXAM: Temp:  [98.2 F (36.8 C)-98.5 F (36.9 C)] 98.2 F (36.8 C) (04/22 0922) Pulse Rate:  [58-78] 69 (04/22 0900) Resp:  [13-21] 15 (04/22 0900) BP: (96-178)/(49-72) 123/57  (04/22 0900) SpO2:  [96 %-100 %] 97 % (04/22 0900) Weight:  [68 kg] 68 kg (04/22 0433)  Intake/Output:  Intake/Output Summary (Last 24 hours) at 12/03/2022 0935 Last data filed at 12/03/2022 0855 Gross per 24 hour  Intake 100 ml  Output --  Net 100 ml     Net IO Since Admission: 100 mL [12/03/22 0935]  Weights:     12/03/2022    4:33 AM 11/30/2022   10:17 AM 01/04/2022    4:20 PM  Last 3 Weights  Weight (lbs) 150 lb 155 lb 155 lb 12.8 oz  Weight (kg) 68.04 kg 70.308 kg 70.67 kg     Physical Exam  Constitutional: No distress.  Age appropriate, hemodynamically stable.   Neck: No JVD present.  Cardiovascular: Normal rate, regular rhythm, S1 normal, S2 normal, intact distal pulses and normal pulses. Exam reveals no gallop, no S3 and no S4.  No murmur heard. Pulmonary/Chest: Effort normal and breath sounds normal. No stridor. She has no wheezes. She has no rales.  Abdominal: Soft. Bowel sounds are normal. She exhibits no distension. There is no abdominal tenderness.  Musculoskeletal:        General: No edema.     Cervical back: Neck supple.  Neurological: She is alert and oriented to person, place, and time. She has intact cranial nerves (2-12).  Skin: Skin is warm and moist.    LAB RESULTS: Chemistry Recent Labs  Lab 11/30/22 1154 12/03/22 0012 12/03/22 0540  NA 138 135 137  K 4.3 3.2* 3.3*  CL 98 101 102  CO2 GLUCOSE 313* 213* 146*  BUN CREATININE 0.69 0.87 0.81  CALCIUM 10.8* 8.6* 9.3  PROT  --  6.2* 6.7  ALBUMIN  --  3.3* 3.5  AST  --  21 25  ALT  --  16 18  ALKPHOS  --  44 40  BILITOT  --  0.4 0.6  GFRNONAA  --  >60 >60  ANIONGAP  --  10 8    Hematology Recent Labs  Lab 11/30/22 1154 12/03/22 0012 12/03/22 0540  WBC 10.9* 11.5* 12.5*  RBC 4.56 4.18 4.14  HGB 13.2 12.5 12.1  HCT 40.5 37.2 36.8  MCV 89 89.0 88.9  MCH 28.9 29.9 29.2  MCHC 32.6 33.6 32.9  RDW 13.0 13.0 13.0  PLT 313 245 271   High Sensitivity Troponin:    Recent Labs  Lab 12/03/22 0012 12/03/22 0253 12/03/22 0800  TROPONINIHS 134* 332* 1,150*  Cardiac EnzymesNo results for input(s): "TROPONINI" in the last 168 hours. No results for input(s): "TROPIPOC" in the last 168 hours.  BNP Recent Labs  Lab 11/30/22 1154 12/03/22 0012  BNP  --  73.3  PROBNP 793*  --     DDimer No results for input(s): "DDIMER" in the last 168 hours.  Hemoglobin A1c:  Lab Results  Component Value Date   HGBA1C 8.7 (H) 12/03/2022   MPG 202.99 12/03/2022   TSH  Recent Labs    12/03/22 0540  TSH 0.381   Lipid Panel  Lab Results  Component Value Date   CHOL  08/13/2007    116        ATP III CLASSIFICATION:  <200     mg/dL   Desirable  161-096  mg/dL   Borderline High  >=045    mg/dL   High   HDL 41 40/98/1191   LDLCALC  08/13/2007    61        Total Cholesterol/HDL:CHD Risk Coronary Heart Disease Risk Table                     Men   Women  1/2 Average Risk   3.4   3.3   TRIG 69 08/13/2007   CHOLHDL 2.8 08/13/2007   Drugs of Abuse  No results found for: "LABOPIA", "COCAINSCRNUR", "LABBENZ", "AMPHETMU", "THCU", "LABBARB"    CARDIAC DATABASE: EKG: November 30, 2022: Sinus rhythm, 75 bpm, normal axis, LAE, without underlying ischemia or injury pattern  December 02, 2022: Sinus rhythm, 71 bpm, subtle T wave abnormalities in the inferior leads consider ischemia   Echocardiogram: 09/01/2018: LVEF 55 to 60%, grade 2 diastolic dysfunction, trivial AR, mild MR, mild left atrial enlargement.   Stress Testing: MPI 05/18/2020            Nuclear stress EF: 72%. No wall motion abnormalities.            There was no ST segment deviation noted during stress.            The study is normal.            This is a low risk study. No evidence of ischemia or infarction. Prior CABG.   Heart Catheterization: None  Scheduled Meds:  insulin aspart  0-6 Units Subcutaneous Q6H   insulin glargine-yfgn  18 Units Subcutaneous QHS   pantoprazole (PROTONIX)  IV  40 mg Intravenous Q24H   sodium chloride flush  3 mL Intravenous Q12H    Continuous Infusions:  heparin 750 Units/hr (12/03/22 0415)   nitroGLYCERIN 45 mcg/min (12/03/22 0657)    PRN Meds: acetaminophen **OR** acetaminophen, melatonin, ondansetron (ZOFRAN) IV  IMPRESSION & RECOMMENDATIONS: NANCYJO GIVHAN is a 77 y.o. Caucasian female whose past medical history and cardiovascular risk factors include: CAD status post CABG 2015 (SVG to diagonal and SVG to PDA), hypertension, diabetes mellitus type 2, hyperlipidemia, OSA..  Impression:  NSTEMI Coronary artery disease involving native vessels with angina pectoris History of CABG (SVG to diagonal, SVG to PDA) Hypertension. Insulin-dependent diabetes mellitus type 2 OSA. Diabetes with hyperlipidemia  Plan:  NSTEMI: Coronary artery disease involving native vessels with angina pectoris History of CABG (SVG to diagonal, SVG to PDA) Chest pain suggestive of angina pectoris. High sensitive troponins elevated. EKG does not illustrate acute injury pattern. Scheduled to have outpatient cath on 12/04/2022 presents to the ED for worsening discomfort Currently on IV heparin and nitro drips Allergic to aspirin. Currently on Plavix. Antianginal therapies  at home included metoprolol, sublingual nitroglycerin tablet, Imdur, and Norvasc. Echo will be ordered to evaluate for structural heart disease and left ventricular systolic function. She decision was to proceed with left heart catheterization with grafts with possible intervention.  Risks, benefits, and alternatives discussed on Friday, 11/30/2022.  No new questions or concerns.   Cath orders placed.  Of note, patient has history of breast cancer and therefore we will need to do a heart catheterization via the right femoral artery approach. Further recommendations pending.  Benign essential hypertension: At the last office visit the blood pressures were not  well-controlled. Antihypertensive medications were uptitrated. On arrival her blood pressures were not optimal either. Currently well-controlled on nitro drip. Will need to uptitrate medical therapy once she is off the nitro drip as this may also be contributing to her discomfort. Not a great candidate for SGLT2 inhibitors in the setting of diabetes given her history of yeast infections and UTIs.  Type 2 diabetes with complications of hyperglycemia and hyperlipidemia. Currently being managed by Dr. Evlyn Kanner Currently on ARB, statin therapy, not a candidate for SGLT2 inhibitors for reasons mentioned above. Recommend goal LDL <55 mg/dL.  CRITICAL CARE Performed by: Tessa Lerner   Total critical care time: 40 minutes   Critical care time was exclusive of separately billable procedures and treating other patients.   Critical care was necessary to treat or prevent imminent or life-threatening deterioration.   Critical care was time spent personally by me on the following activities: development of treatment plan with patient and friend at bedside, discussions with ED provider over the phone, initiating IV Heparin for ACS and nitro gtt, evaluation of patient's response to treatment, examination of patient, obtaining history from patient or surrogate, ordering and performing treatments and interventions, ordering and review of laboratory studies, ordering and review of radiographic studies, pulse oximetry and re-evaluation of patient's condition.  This note was created using a voice recognition software as a result there may be grammatical errors inadvertently enclosed that do not reflect the nature of this encounter. Every attempt is made to correct such errors.  Delilah Shan [facility]  Pager:  [phone] Office: [phone] 12/03/2022, 9:35 AM

## 2022-12-03 NOTE — ED Notes (Signed)
Back from b/r, steady gait, returned to monitor and 4th of 4 KCL run

## 2022-12-03 NOTE — Interval H&P Note (Signed)
History and Physical Interval Note:  12/03/2022 3:34 PM  Susan Barnett  has presented today for surgery, with the diagnosis of unstable angina.  The various methods of treatment have been discussed with the patient and family. After consideration of risks, benefits and other options for treatment, the patient has consented to  Procedure(s): LEFT HEART CATH AND CORONARY ANGIOGRAPHY (N/A) as a surgical intervention.  The patient's history has been reviewed, patient examined, no change in status, stable for surgery.  I have reviewed the patient's chart and labs.  Questions were answered to the patient's satisfaction.    2016 Appropriate Use Criteria for Coronary Revascularization in Patients With Acute Coronary Syndrome NSTEMI/Unstable angina, stabilized patient at high risk Indication:  Revascularization by PCI or CABG of 1 or more arteries in a patient with NSTEMI or unstable angina with Stabilization after presentation High risk for clinical events A (7) Indication: 16; Score 7   Trig Mcbryar J Delcie Ruppert

## 2022-12-03 NOTE — Progress Notes (Signed)
Susan Barnett is a 77 y.o. female with medical history significant for coronary artery disease status post two-vessel CABG, chronic diastolic heart failure, type 2 diabetes mellitus, essential hypertension, obstructive sleep apnea on home nocturnal CPAP, acquired hypothyroidism, who is admitted to [facility] on 12/02/2022 with NSTEMI after presenting from home to [facility] ED complaining of chest pain.    Plan  Cath later today. Continue with IV heparin, IV ntg.    Kathlen Mody, MD

## 2022-12-03 NOTE — H&P (Signed)
History and Physical      [name]:[id number] DOB: [birth year] DOA: 12/02/2022  PCP: Johny Blamer, MD  Patient coming from: home   I have personally briefly reviewed patient's old medical records in [facility] Link  Chief Complaint: Chest pain  HPI: Susan Barnett is a 77 y.o. female with medical history significant for coronary artery disease status post two-vessel CABG, chronic diastolic heart failure, type 2 diabetes mellitus, essential hypertension, obstructive sleep apnea on home nocturnal CPAP, acquired hypothyroidism, who is admitted to [facility] on 12/02/2022 with NSTEMI after presenting from home to [facility] ED complaining of chest pain.   The patient reports that she has been experiencing 2 weeks of substernal chest pressure with radiation into the left jaw and left neck, noting that this chest pain intensifies with exertion, and improves with rest.  She was reportedly evaluated by outpatient cardiology, Dr. Odis Hollingshead On Friday, 11/30/2022, and was at that time scheduled for upcoming left-sided coronary angiography to occur on Tuesday, 12/04/2022.  However, on Sunday, 12/02/2022, she experienced additional of the above substernal chest pressure with radiation to the left jaw and left neck, but noted that this chest discomfort was more intense than that which she has been experiencing intermittently over the course of the preceding 2 weeks.  She took 2 sublingual nitroglycerin at home, which completely resolved her chest pain, at which time she presented to [facility] emergency department for further evaluation management thereof.  While in [facility] emergency department, she experienced additional of the aforementioned substernal chest pressure, and was ultimately started on nitroglycerin drip upon which her chest pain is completely resolved, with the patient noting that she remains chest pain-free at this time.    She conveys that these intermittent episodes of chest pain  and not associated with any shortness of breath, palpitations, diaphoresis, nausea, vomiting, dizziness, presyncope, or syncope.  No recent preceding trauma.  Not associate with any recent subjective fever, chills, rigors, or generalized myalgias.  No recent cough, mopped assist.  Denies any worsening of peripheral edema.  She has a known history of coronary artery disease status post two-vessel CABG approximately 10 years ago, followed by interval stent placement x 2.  Per chart review, most recent echocardiogram occurred in January 2020 was notable for LVEF 55 to 60%, grade 2 diastolic/, no evidence of focal motion abnormalities, will demonstrating trivial aortic regurgitation, mild-mod regurgitation, mildly dilated left atrium.  Most recent coronary ischemic evaluation appears to have occurred in October 2021, which time she underwent nuclear stress test, which reportedly showed no evidence of reversible ischemia.  She has a history of allergy to aspirin, and is on daily Plavix as consequence of this.     ED Course:  Vital signs in the ED were notable for the following: Afebrile; heart rates in 70s; systolic pressures in the 140s to 170s; respiratory rate 15-21, oxygen saturation 96% on room air.  Labs were notable for the following: CMP notable for the following: Sodium 135, potassium 3.2, bicarbonate 24, creatinine 0.87, glucose 213, calcium, adjusted for mild hyperlipidemia noted to be 9.1, bilirubin 3, otherwise, liver enzymes in the lower limits.  BNP 73.  High sensitive troponin I initially 134, with repeat value trending up slightly to 332, in the absence of any previous high-sensitivity troponin I values available for point comparison.  CBC notable for white cell count 11,900, hemoglobin 12.5.  Per my interpretation, EKG in ED demonstrated the following: In comparison to most recent prior  EKG performed on 11/30/2022, presenting EKG shows sinus rhythm with heart rate 71, normal intervals, T  wave version in leads III and aVF, which appears new from his recent prior EKG, demonstrated no evidence of ST changes, clearness of ST elevation.  Imaging and additional notable ED work-up: 1 view chest x-ray, performed radiology read, shows no evidence of acute cardiopulmonary process, including no evidence of allergic edema, effusion, or pneumothorax.  EDP discussed patient's case with Dr. Odis Hollingshead  [facility] cardiology, who recommended [facility] admission for further evaluation management of presenting NSTEMI.  Dr. Odis Hollingshead  requests continuation of nitroglycerin drip as well as initiation of heparin drip, and requested the patient be kept n.p.o. after midnight, with the possibility of taking the patient for left-sided heart cath in the morning.  While in the ED, the following were administered: Heparin bolus followed by initiation heparin drip.  Nitroglycerin drip.  Subsequently, the patient was admitted for further evaluation management of presenting NSTEMI, presenting labs also notable for hypokalemia.     Review of Systems: As per HPI otherwise 10 point review of systems negative.   Past Medical History:  Diagnosis Date   Anemia    mild   Anginal pain    with exertion, last time prior to cardiac caht 06/02/14   Anxiety    situational   Arthritis    Breast cancer 2018   Left Breast Cancer   CAD (coronary artery disease)    Dr. Mendel Ryder follows, no problems now   Cancer    Breast cancer   Complication of anesthesia    Diabetes mellitus, type 2    TYPE 2   Dysrhythmia    occ skips a beat, rate was fast before beta blocker.   Gastric polyp    Genetic testing 04/12/2017   Ms. Kingdon underwent genetic counseling and testing for hereditary cancer syndromes on 02/19/2017. Her results were negative for mutations in all 46 genes analyzed by [employer]'s 46-gene Common Hereditary Cancers Panel. Genes analyzed include: APC, ATM, AXIN2, BARD1, BMPR1A, BRCA1, BRCA2, BRIP1, CDH1, CDKN2A, CHEK2,  CTNNA1, DICER1, EPCAM, GREM1, HOXB13, KIT, MEN1, MLH1, MSH2, MSH3, MSH6, MUTYH, NBN   GERD (gastroesophageal reflux disease)    Headache    hx of   Heartburn    Hiatal hernia    mild head tremors   History of radiation therapy 01/30/17- 02/21/17   Left Breast 42.56 Gy in 16 fractions   Hypertension    Hypothyroidism    Occasional tremors    "of head", slight hands   Osteopenia    Personal history of radiation therapy 2018   Left Breast Cancer   Pneumonia    PONV (postoperative nausea and vomiting) 02/24/14   Shortness of breath    with exertion   Sleep apnea    cpap used nightly x 2 yrs now.    Past Surgical History:  Procedure Laterality Date   BREAST BIOPSY Left 1987   BREAST BIOPSY Left 2019   BREAST LUMPECTOMY Left 01/25/2017   BREAST LUMPECTOMY WITH RADIOACTIVE SEED AND SENTINEL LYMPH NODE BIOPSY Left 12/25/2016   Procedure: BREAST LUMPECTOMY WITH RADIOACTIVE SEED AND SENTINEL LYMPH NODE BIOPSY;  Surgeon: Glenna Fellows, MD;  Location: [facility] OR;  Service: General;  Laterality: Left;   CARDIAC CATHETERIZATION  06/02/2014   LAD 30%, D1 80%, CFX 755, RCA > 95% ISR, rx w/ PTCA, heavy calcification, EF 65%   CORONARY ARTERY BYPASS GRAFT N/A 06/14/2014   Procedure: CORONARY ARTERY BYPASS GRAFTING (CABG);  Surgeon:  Alleen Borne, MD;  Location: [facility] OR;  Service: Open Heart Surgery;  Laterality: N/A;  Times 2 using endoscopically harvested right saphenous vein.   coronary stents     x3 stents placed-prior ablation   DILATION AND CURETTAGE OF UTERUS     ELBOW FRACTURE SURGERY Left    INCONTINENCE SURGERY     sling   KNEE ARTHROSCOPY Right 02/24/2014   Procedure: RIGHT KNEE ARTHROSCOPY WITH DEBRIDEMENT ;  Surgeon: Loanne Drilling, MD;  Location: [facility] ORS;  Service: Orthopedics;  Laterality: Right;   LEFT HEART CATHETERIZATION WITH CORONARY ANGIOGRAM N/A 06/02/2014   Procedure: LEFT HEART CATHETERIZATION WITH CORONARY ANGIOGRAM;  Surgeon: Lesleigh Noe, MD;  Location: [facility] CATH LAB;   Service: Cardiovascular;  Laterality: N/A;   TEE WITHOUT CARDIOVERSION N/A 06/14/2014   Procedure: TRANSESOPHAGEAL ECHOCARDIOGRAM (TEE);  Surgeon: Alleen Borne, MD;  Location: [facility] OR;  Service: Open Heart Surgery;  Laterality: N/A;    Social History:  reports that she has never smoked. She has never used smokeless tobacco. She reports that she does not drink alcohol and does not use drugs.   Allergies  Allergen Reactions   Asa [Aspirin] Anaphylaxis   Compazine [Prochlorperazine Edisylate] Swelling and Other (See Comments)    Tongue swelling    Nsaids Hives and Swelling   Shellfish Allergy Hives and Swelling   Sulfa Antibiotics Hives and Swelling   Zantac [Ranitidine Hcl] Hives and Swelling   Lisinopril Cough    Family History  Problem Relation Age of Onset   Heart attack Mother    Hypertension Mother    Diabetes Mother    Lung cancer Brother        d.57 from metastatic disease. History of smoking.   Healthy Brother    Hodgkin's lymphoma Maternal Uncle        d.62s   Ulcerative colitis Daughter    Breast cancer Cousin 30       d.30s    Family history reviewed and not pertinent    Prior to Admission medications   Medication Sig Start Date End Date Taking? Authorizing Provider  acetaminophen (TYLENOL) 650 MG CR tablet Take 1,300 mg by mouth in the morning and at bedtime.    [provider]  amLODipine (NORVASC) 5 MG tablet Take 5 mg by mouth daily.    [provider]  atorvastatin (LIPITOR) 10 MG tablet Take 10 mg by mouth every evening.     [provider]  CALCIUM PO Take 1 tablet by mouth in the morning and at bedtime.    [provider]  Cetirizine HCl (ZYRTEC ALLERGY) 10 MG CAPS Take 1 tablet by mouth daily. 07/06/19   [provider]  cloNIDine (CATAPRES) 0.1 MG tablet TAKE ONE TABLET BY MOUTH EVERY MORNING and TAKE ONE TABLET BY MOUTH EVERY EVENING 11/03/21   Lyn Records, MD  clopidogrel (PLAVIX) 75 MG tablet Take 75 mg  by mouth daily.    [provider]  Coenzyme Q10 300 MG CAPS Take 300 mg by mouth 2 (two) times daily.    [provider]  D-Mannose 500 MG CAPS Take 500 mg by mouth in the morning and at bedtime.    [provider]  hydrochlorothiazide (HYDRODIURIL) 25 MG tablet Take 1 tablet (25 mg total) by mouth every morning. 11/30/22 02/28/23  Tolia, Sunit, DO  isosorbide mononitrate (IMDUR) 30 MG 24 hr tablet Take 1 tablet (30 mg total) by mouth daily at 10 pm. 11/30/22 02/28/23  Tolia, Sunit, DO  LANTUS SOLOSTAR 100 UNIT/ML Solostar Pen Inject 40 Units into the skin at bedtime. Patient taking differently: Inject 42 Units into the skin at bedtime. 05/23/18   Serena Croissant, MD  levothyroxine (SYNTHROID, LEVOTHROID) 75 MCG tablet Take 75 mcg by mouth daily before breakfast.     [provider]  losartan (COZAAR) 100 MG tablet Take 1 tablet (100 mg total) by mouth daily. 10/28/19   Lyn Records, MD  metoprolol tartrate (LOPRESSOR) 50 MG tablet TAKE TWO TABLETS BY MOUTH EVERY MORNING and TAKE TWO TABLETS BY MOUTH EVERY EVENING 05/24/22   Lyn Records, MD  miconazole (ANTIFUNGAL) 2 % powder Apply 1 Application topically as needed for itching.    [provider]  Multiple Vitamin (MULTIVITAMIN ADULT PO) Take 1 tablet by mouth daily.    [provider]  nitroGLYCERIN (NITROSTAT) 0.4 MG SL tablet Place 1 tablet (0.4 mg total) under the tongue every 5 (five) minutes as needed for chest pain. 11/30/22 02/28/23  Tolia, Sunit, DO  ONETOUCH VERIO test strip 1 each 2 (two) times daily. 04/18/20   [provider]  pantoprazole (PROTONIX) 40 MG tablet Take 1 tablet (40 mg total) by mouth daily before breakfast. 09/08/14   Lyn Records, MD  Probiotic Product (ALIGN) 4 MG CAPS Take 4 mg by mouth daily.    [provider]  sitaGLIPtin-metformin (JANUMET) 50-1000 MG tablet Take 1 tablet by mouth 2 (two) times daily with a meal.    [provider]      Objective    Physical Exam: Vitals:   12/03/22 0345 12/03/22 0400 12/03/22 0415 12/03/22 0433  BP: (!) 150/68 (!) 150/62 (!) 153/52   Pulse:      Resp: 18 14 13    Temp:      TempSrc:      SpO2:      Weight:    68 kg  Height:    5\' 1"  (1.549 m)    General: appears to be stated age; alert, oriented Skin: warm, dry, no rash Head:  AT/[place] Mouth:  Oral mucosa membranes appear moist, normal dentition Neck: supple; trachea midline Heart:  RRR; did not appreciate any M/R/G Lungs: CTAB, did not appreciate any wheezes, rales, or rhonchi Abdomen: + BS; soft, ND, NT Vascular: 2+ pedal pulses b/l; 2+ radial pulses b/l Extremities: no peripheral edema, no muscle wasting Neuro: strength and sensation intact in upper and lower extremities b/l     Labs on Admission: I have personally reviewed following labs and imaging studies  CBC: Recent Labs  Lab 11/30/22 1154 12/03/22 0012  WBC 10.9* 11.5*  NEUTROABS  --  8.3*  HGB 13.2 12.5  HCT 40.5 37.2  MCV 89 89.0  PLT 313 245   Basic Metabolic Panel: Recent Labs  Lab 11/30/22 1154 12/03/22 0012  NA 138 135  K 4.3 3.2*  CL 98 101  CO2 25 24  GLUCOSE 313* 213*  BUN 15 17  CREATININE 0.69 0.87  CALCIUM 10.8* 8.6*   GFR: Estimated Creatinine Clearance: 47.8 mL/min (by C-G formula based on SCr of 0.87 mg/dL). Liver Function Tests: Recent Labs  Lab 12/03/22 0012  AST 21  ALT 16  ALKPHOS 44  BILITOT 0.4  PROT 6.2*  ALBUMIN 3.3*   No results for input(s): "LIPASE", "AMYLASE" in the last 168 hours. No results for input(s): "AMMONIA" in the last 168 hours. Coagulation Profile: No results for input(s): "INR", "PROTIME" in the last 168 hours. Cardiac Enzymes:  No results for input(s): "CKTOTAL", "CKMB", "CKMBINDEX", "TROPONINI" in the last 168 hours. BNP (last 3 results) Recent Labs    11/30/22 1154  PROBNP 793*   HbA1C: No results for input(s): "HGBA1C" in the last 72 hours. CBG: No results for input(s):  "GLUCAP" in the last 168 hours. Lipid Profile: No results for input(s): "CHOL", "HDL", "LDLCALC", "TRIG", "CHOLHDL", "LDLDIRECT" in the last 72 hours. Thyroid Function Tests: No results for input(s): "TSH", "T4TOTAL", "FREET4", "T3FREE", "THYROIDAB" in the last 72 hours. Anemia Panel: No results for input(s): "VITAMINB12", "FOLATE", "FERRITIN", "TIBC", "IRON", "RETICCTPCT" in the last 72 hours. Urine analysis:    Component Value Date/Time   COLORURINE STRAW (A) 12/19/2019 1430   APPEARANCEUR CLEAR 12/19/2019 1430   LABSPEC 1.011 12/19/2019 1430   PHURINE 7.0 12/19/2019 1430   GLUCOSEU >=500 (A) 12/19/2019 1430   HGBUR NEGATIVE 12/19/2019 1430   BILIRUBINUR negative 04/24/2020 1617   KETONESUR negative 04/24/2020 1617   KETONESUR NEGATIVE 12/19/2019 1430   PROTEINUR negative 04/24/2020 1617   PROTEINUR NEGATIVE 12/19/2019 1430   UROBILINOGEN 0.2 04/24/2020 1617   UROBILINOGEN 0.2 06/11/2014 1536   NITRITE Negative 04/24/2020 1617   NITRITE NEGATIVE 12/19/2019 1430   LEUKOCYTESUR Small (1+) (A) 04/24/2020 1617   LEUKOCYTESUR NEGATIVE 12/19/2019 1430    Radiological Exams on Admission: DG Chest Portable 1 View  Result Date: 12/03/2022 CLINICAL DATA:  Chest pain. EXAM: PORTABLE CHEST 1 VIEW COMPARISON:  Dec 17, 2025 FINDINGS: Multiple sternal wires are noted. The heart size and mediastinal contours are within normal limits. There is moderate severity calcification of the aortic arch and tortuosity of the descending thoracic aorta. Low lung volumes are seen. Both lungs are clear. The visualized skeletal structures are unremarkable. IMPRESSION: 1. Evidence of prior median sternotomy/CABG. 2. No acute cardiopulmonary disease. Electronically Signed   By: Aram Candela M.D.   On: 12/03/2022 00:04      Assessment/Plan   Principal Problem:   NSTEMI (non-ST elevated myocardial infarction) Active Problems:   Acquired hypothyroidism   DM2 (diabetes mellitus, type 2)   Essential  hypertension   GERD (gastroesophageal reflux disease)   Obstructive sleep apnea   Chest pain   Hypokalemia   Chronic diastolic CHF (congestive heart failure)     #) NSTEMI: Diagnosis on the basis of 2 weeks of progressive substernal exertional chest pressure with radiation into the left jaw/left neck, with increasing intensity over the last day, with presenting troponin found to be mildly elevated, as further quantified above, with EKG showing interval development of T wave inversion in inferior leads, but no evidence of ST changes, including no evidence of ST elevation, while chest x-ray shows no evidence of acute cardiopulmonary process.  Of note, her chest pain has resolved following initiation of nitroglycerin drip, as further detailed above, and she is currently chest pain-free.  This is in the context of a known history of coronary disease status post two-vessel CABG 10 years ago followed by interval stent placement.  EDP is discussed patient's case with Dr. Odis Hollingshead  [facility] cardiology, recommends [facility] admission for NSTEMI, continuation of nitroglycerin drip as well as heparin drip.  Dr. Odis Hollingshead  To formally consult and see the patient in the morning, with consideration for possibly taking the patient for left-sided coronary angiography in the morning.  He requested the patient be kept n.p.o. in the interval.  Of note, she has an allergy to aspirin, and is therefore on Plavix as an outpatient, with most recent dose occurring on 12/02/2022.  Plan: Cardiology formally consulted, as above.  Per cardiology recommendations, will continue heparin drip, blood sugars are drip, keep the patient NPO.  Continue to trend troponin.  Echocardiogram ordered for the morning.  In the setting of cardiology's request to keep the patient n.p.o., will hold Lopressor, losartan, and atorvastatin for now.  Further evaluation management of presenting hypokalemia, as further detailed below.  Add on serum magnesium  level.  Repeat CMP, CBC in the morning.             #) Hypokalemia: Presenting serum potassium level noted to be 3.2, notable in the context of presenting NSTEMI.  Will provide supplementation as her potassium level in order to reduce risk of ventricular arrhythmia, as further detailed below.  Plan: In the setting of current n.p.o. status, will pursue potassium chloride 40 mill equivalents IV over 4 hours.  Monitor on symmetry.  Add on serum magnesium level.  Repeat CMP in the morning.                #) Type 2 Diabetes Mellitus: documented history of such. Home insulin regimen: Lantus 42 units SQ nightly. Home oral hypoglycemic agents: Sitagliptin, metformin. presenting blood sugar: 213. Most recent A1c noted to be 8.4% when checked in May 2021.  in terms of initial dose of basal insulin to be started during this hospitalization, will resume approximately half of outpatient dose in order to reduce risk for ensuing hypoglycemia   Plan: In the setting of current n.p.o. status, will pursue Accu-Cheks on acute 6-hour basis with associated sign scale insulin.  Lantus 18 units SQ nightly, as above. hold home oral hypoglycemic agents during this hospitalization.  Add on hemoglobin A1c level.                #) Essential Hypertension: documented h/o such, with outpatient antihypertensive regimen including oral clonidine, losartan, amlodipine, HCTZ, Imdur, metoprolol to tartrate..  SBP's in the ED today: 140s to 170s mmHg, most recently in the 140s following initiation of nitroglycerin drip for chest pain in the ED. in the context of cardiology's request for the patient be kept n.p.o., will hold home oral antihypertensive medications for now.  Plan: Close monitoring of subsequent BP via routine VS. hold home oral and hypertensive medications for now, as above.                #) GERD: documented h/o such; on Protonix as outpatient.   Plan: continue home  PPI.                #) acquired hypothyroidism: documented h/o such, on Synthroid as outpatient.   Plan: In the setting of current n.p.o. status, will hold home Synthroid for now.  Add on TSH level.                #) Obstructive sleep apnea: Documented history of such, with patient reporting good compliance on home nocturnal CPAP.   Plan: I have placed respiratory therapy consultation for provision of scheduled nocturnal CPAP.                 #) Chronic diastolic heart failure: documented history of such, with most recent echocardiogram performed in January 2020, which is notable for LVEF 55 to 60%, grade 2 diastolic dysfunction, with additional details as conveyed above. No clinical or radiographic evidence to suggest acutely decompensated heart failure at this time. home diuretic regimen reportedly consists of the following: HCTZ, in the absence of any loop diuretics.   Plan:  monitor strict I's & O's and daily weights. Repeat CMP in AM. Check serum mag level.  In the setting of current n.p.o. status, will hold home HCTZ for now.  Follow-up result of updated echocardiogram, which has been ordered for the morning.     DVT prophylaxis: Heparin drip, as above Code Status: Full code Family Communication: none Disposition Plan: Per Rounding Team Consults called: EDP has discussed patient's case with on-call [facility] cardiology, Dr. Odis Hollingshead , Who will formally consult and see the patient in the morning, as further detailed above;  Admission status: Inpatient     I SPENT GREATER THAN 75  MINUTES IN CLINICAL CARE TIME/MEDICAL DECISION-MAKING IN COMPLETING THIS ADMISSION.      Chaney Born Pete Schnitzer DO [facility]  From 7PM - 7AM   12/03/2022, 4:38 AM

## 2022-12-03 NOTE — Code Documentation (Signed)
Jannifer Fischler is a 77 yr old female undergoing cardiac cath. She has a PMH of  DM2, ACS s/p CABG and multiple stents, breast cancer s/p radiation, GERD, OSA on CPAP, anemia, anxiety, and CAD. She was admitted late last night for NSTEMI. She has been on Heparin, it was stopped at 1630. She was last known well at 1744 and now is slow to respond and follow commands.    Stroke team to Cath Lab. Pt is drowsy, will answer questions and follow commands with much stimulation. NIHSS 6. Pt is sleepy, got month wrong on orientation question, legs are weak. Exam non-focal. Please see documentation for details and times.     The following imaging was completed: CT head. Per Dr. Derry Lory, CT head is negative for acute abnormality. Pt taken to 2 H room one where her workup will continue. Pt will need q 2 hr VS and NIHSS. She will need stroke swallow screen prior to any po intake. Bedside handoff with 2 H team at bedside.

## 2022-12-03 NOTE — Progress Notes (Signed)
Echocardiogram 2D Echocardiogram has been performed.  Toni Amend 12/03/2022, 2:23 PM

## 2022-12-03 NOTE — Progress Notes (Signed)
ANTICOAGULATION CONSULT NOTE - Initial Consult  Pharmacy Consult for Heparin Indication: chest pain/ACS  Allergies  Allergen Reactions   Asa [Aspirin] Anaphylaxis   Compazine [Prochlorperazine Edisylate] Swelling and Other (See Comments)    Tongue swelling    Nsaids Hives and Swelling   Shellfish Allergy Hives and Swelling   Sulfa Antibiotics Hives and Swelling   Zantac [Ranitidine Hcl] Hives and Swelling   Lisinopril Cough    Patient Measurements:   Heparin Dosing Weight: 64.9 kg  Vital Signs: Temp: 98.4 F (36.9 C) (04/21 2335) Temp Source: Oral (04/21 2335) BP: 168/72 (04/21 2344) Pulse Rate: 78 (04/21 2344)  Labs: Recent Labs    11/30/22 1154 12/03/22 0012  HGB 13.2 12.5  HCT 40.5 37.2  PLT 313 245  CREATININE 0.69 0.87  TROPONINIHS  --  134*    Estimated Creatinine Clearance: 49.8 mL/min (by C-G formula based on SCr of 0.87 mg/dL).   Medical History: Past Medical History:  Diagnosis Date   Anemia    mild   Anginal pain    with exertion, last time prior to cardiac caht 06/02/14   Anxiety    situational   Arthritis    Breast cancer 2018   Left Breast Cancer   CAD (coronary artery disease)    Dr. Mendel Ryder follows, no problems now   Cancer    Breast cancer   Complication of anesthesia    Diabetes mellitus, type 2    TYPE 2   Dysrhythmia    occ skips a beat, rate was fast before beta blocker.   Gastric polyp    Genetic testing 04/12/2017   Ms. Casella underwent genetic counseling and testing for hereditary cancer syndromes on 02/19/2017. Her results were negative for mutations in all 46 genes analyzed by [facility]'s 46-gene Common Hereditary Cancers Panel. Genes analyzed include: APC, ATM, AXIN2, BARD1, BMPR1A, BRCA1, BRCA2, BRIP1, CDH1, CDKN2A, CHEK2, CTNNA1, DICER1, EPCAM, GREM1, HOXB13, KIT, MEN1, MLH1, MSH2, MSH3, MSH6, MUTYH, NBN   GERD (gastroesophageal reflux disease)    Headache    hx of   Heartburn    Hiatal hernia    mild head tremors    History of radiation therapy 01/30/17- 02/21/17   Left Breast 42.56 Gy in 16 fractions   Hypertension    Hypothyroidism    Occasional tremors    "of head", slight hands   Osteopenia    Personal history of radiation therapy 2018   Left Breast Cancer   Pneumonia    PONV (postoperative nausea and vomiting) 02/24/14   Shortness of breath    with exertion   Sleep apnea    cpap used nightly x 2 yrs now.   Assessment: Patient is presenting with tightness in chest and throat that has been present for a week. Patient has PMHx of CAD s/p CABG in 2015, HTN, T2DM, HLD, OSA and Breast Cancer. Per 4/19 office visit with Dr. Odis Hollingshead patient has been experiencing ongoing substernal chest pain for the past several days with both activities and at rest. Dispense report shows patient takes clopidogrel at home. Troponins 134, Hgb 12.5, Plts 245, No noted bleeding on presentation.   Goal of Therapy:  Heparin level 0.3-0.7 units/ml Monitor platelets by anticoagulation protocol: Yes   Plan:  Heparin bolus of 3000units x 1 dose Start Heparin infusion 750units/hr Check HL in 8 hours Monitor CBC and for signs/symptoms of bleeding  Loretta Plume 12/03/2022,3:30 AM

## 2022-12-03 NOTE — Progress Notes (Signed)
Report received from cath lab RN Alyssa. Pt  arrived to unit at 1904. TR Band and groin site assessed with cath lab RN. Pt was hooked up to monitor and vital signs stable. Report was given to night shift RN John at bedside.No changes from cath lab assessment. Ruben Gottron RN

## 2022-12-04 ENCOUNTER — Encounter (HOSPITAL_COMMUNITY): Payer: Self-pay | Admitting: Cardiology

## 2022-12-04 ENCOUNTER — Inpatient Hospital Stay (HOSPITAL_COMMUNITY): Payer: Medicare Other

## 2022-12-04 ENCOUNTER — Encounter (HOSPITAL_COMMUNITY): Admission: RE | Payer: Self-pay | Source: Home / Self Care

## 2022-12-04 ENCOUNTER — Ambulatory Visit (HOSPITAL_COMMUNITY): Admission: RE | Admit: 2022-12-04 | Payer: Medicare Other | Source: Home / Self Care | Admitting: Cardiology

## 2022-12-04 DIAGNOSIS — Z951 Presence of aortocoronary bypass graft: Secondary | ICD-10-CM

## 2022-12-04 DIAGNOSIS — E782 Mixed hyperlipidemia: Secondary | ICD-10-CM

## 2022-12-04 DIAGNOSIS — E1169 Type 2 diabetes mellitus with other specified complication: Secondary | ICD-10-CM

## 2022-12-04 DIAGNOSIS — G4733 Obstructive sleep apnea (adult) (pediatric): Secondary | ICD-10-CM | POA: Diagnosis not present

## 2022-12-04 DIAGNOSIS — E039 Hypothyroidism, unspecified: Secondary | ICD-10-CM | POA: Diagnosis not present

## 2022-12-04 DIAGNOSIS — I214 Non-ST elevation (NSTEMI) myocardial infarction: Secondary | ICD-10-CM | POA: Diagnosis not present

## 2022-12-04 LAB — CBC
HCT: 37.9 % (ref 36.0–46.0)
Hemoglobin: 12.2 g/dL (ref 12.0–15.0)
MCH: 29 pg (ref 26.0–34.0)
MCHC: 32.2 g/dL (ref 30.0–36.0)
MCV: 90 fL (ref 80.0–100.0)
Platelets: 298 10*3/uL (ref 150–400)
RBC: 4.21 MIL/uL (ref 3.87–5.11)
RDW: 13.3 % (ref 11.5–15.5)
WBC: 17.4 10*3/uL — ABNORMAL HIGH (ref 4.0–10.5)
nRBC: 0 % (ref 0.0–0.2)

## 2022-12-04 LAB — GLUCOSE, CAPILLARY
Glucose-Capillary: 242 mg/dL — ABNORMAL HIGH (ref 70–99)
Glucose-Capillary: 231 mg/dL — ABNORMAL HIGH (ref 70–99)
Glucose-Capillary: 244 mg/dL — ABNORMAL HIGH (ref 70–99)

## 2022-12-04 LAB — BASIC METABOLIC PANEL
Anion gap: 8 (ref 5–15)
BUN: 11 mg/dL (ref 8–23)
CO2: 23 mmol/L (ref 22–32)
Calcium: 8.6 mg/dL — ABNORMAL LOW (ref 8.9–10.3)
Chloride: 106 mmol/L (ref 98–111)
Creatinine, Ser: 0.73 mg/dL (ref 0.44–1.00)
GFR, Estimated: >60 mL/min (ref >60)
Glucose, Bld: 247 mg/dL — ABNORMAL HIGH (ref 70–99)
Potassium: 3.9 mmol/L (ref 3.5–5.1)
Sodium: 137 mmol/L (ref 135–145)

## 2022-12-04 LAB — MRSA NEXT GEN BY PCR, NASAL: MRSA by PCR Next Gen: NOT DETECTED

## 2022-12-04 LAB — MAGNESIUM: Magnesium: 2.2 mg/dL (ref 1.7–2.4)

## 2022-12-04 SURGERY — LEFT HEART CATH AND CORS/GRAFTS ANGIOGRAPHY
Anesthesia: LOCAL

## 2022-12-04 MED ORDER — CLOPIDOGREL BISULFATE 75 MG PO TABS
75.0000 mg | ORAL_TABLET | Freq: Every day | ORAL | Status: DC
  Administered 2022-12-04 – 2022-12-05 (×2): 75 mg via ORAL
  Filled 2022-12-04 (×2): qty 1

## 2022-12-04 MED ORDER — CLONIDINE HCL 0.1 MG PO TABS
0.1000 mg | ORAL_TABLET | Freq: Two times a day (BID) | ORAL | Status: DC
  Administered 2022-12-04 – 2022-12-05 (×3): 0.1 mg via ORAL
  Filled 2022-12-04 (×3): qty 1

## 2022-12-04 MED ORDER — FUROSEMIDE 10 MG/ML IJ SOLN
40.0000 mg | Freq: Once | INTRAMUSCULAR | Status: AC
  Administered 2022-12-04: 40 mg via INTRAVENOUS
  Filled 2022-12-04: qty 4

## 2022-12-04 MED ORDER — LEVOTHYROXINE SODIUM 75 MCG PO TABS
75.0000 ug | ORAL_TABLET | Freq: Every day | ORAL | Status: DC
  Administered 2022-12-05: 75 ug via ORAL
  Filled 2022-12-04: qty 1

## 2022-12-04 MED ORDER — INSULIN GLARGINE-YFGN 100 UNIT/ML ~~LOC~~ SOLN
40.0000 [IU] | Freq: Every day | SUBCUTANEOUS | Status: DC
  Administered 2022-12-04: 40 [IU] via SUBCUTANEOUS
  Filled 2022-12-04 (×2): qty 0.4

## 2022-12-04 MED ORDER — AMLODIPINE BESYLATE 5 MG PO TABS
5.0000 mg | ORAL_TABLET | Freq: Every day | ORAL | Status: DC
  Administered 2022-12-04 – 2022-12-05 (×2): 5 mg via ORAL
  Filled 2022-12-04 (×2): qty 1

## 2022-12-04 MED ORDER — HYDROCHLOROTHIAZIDE 25 MG PO TABS
25.0000 mg | ORAL_TABLET | Freq: Every morning | ORAL | Status: DC
  Administered 2022-12-04 – 2022-12-05 (×2): 25 mg via ORAL
  Filled 2022-12-04 (×2): qty 1

## 2022-12-04 MED FILL — Dopamine in Dextrose 5% Inj 3.2 MG/ML: INTRAVENOUS | Qty: 250 | Status: AC

## 2022-12-04 MED FILL — Clopidogrel Bisulfate Tab 75 MG (Base Equiv): ORAL | Qty: 1 | Status: AC

## 2022-12-04 MED FILL — Heparin Sodium (Porcine) Inj 1000 Unit/ML: INTRAMUSCULAR | Qty: 10 | Status: AC

## 2022-12-04 NOTE — Progress Notes (Addendum)
Neurology Progress Note  Brief HPI: 77 year old patient with history of diabetes, CABG, breast cancer status postradiation, GERD, sleep apnea, anemia, anxiety and CAD presented to the ED on 421 with chest tightness for a week.  She was found to be having a non-STEMI and was taken to the Cath Lab on 4/22 and was noted to be quite hypertensive before her procedure.  During the procedure, she went into complete heart block and was hypotensive with systolic blood pressure in the 70s.  Temporary pacemaker was inserted, but patient was found to be confused with speech changes out of proportion to confusion.  Code stroke was called, and patient was evaluated.  CT head was negative, awaiting MRI, but patient still has temporary pacemaker and cannot have this study yet.  Subjective: Patient states that she is doing much better today and feels that she is back to her mental baseline.  She does have a slightly increased hand tremor.  Exam: Vitals:   12/04/22 0545 12/04/22 0600  BP: (!) 169/56 (!) 168/57  Pulse: 72 73  Resp: (!) 22 16  Temp:    SpO2: 93% 93%   Gen: In bed, NAD Resp: non-labored breathing, no acute distress Abd: soft, nt  Neuro: Mental Status: Alert and oriented to person, place, time and situation, able to state month and age correctly. Cranial Nerves: Pupils equal round and reactive, extraocular movements intact, facial sensation symmetrical, face symmetrical, phonation normal, hearing intact to voice, shoulder shrug symmetrical and tongue midline. Motor: Able to move all 4 extremities with good antigravity strength, tremor noted in hands Sensory: Intact to light touch throughout Gait: Deferred  Pertinent Labs:    Latest Ref Rng & Units 12/04/2022    4:02 AM 12/03/2022    7:54 PM 12/03/2022    5:40 AM  CBC  WBC 4.0 - 10.5 K/uL 17.4  23.3  12.5   Hemoglobin 12.0 - 15.0 g/dL 16.1  09.6  04.5   Hematocrit 36.0 - 46.0 % 37.9  37.5  36.8   Platelets 150 - 400 K/uL 298  284  271         Latest Ref Rng & Units 12/04/2022    4:02 AM 12/03/2022    7:54 PM 12/03/2022    7:12 PM  BMP  Glucose 70 - 99 mg/dL 409  811    BUN 8 - 23 mg/dL 11  12    Creatinine 9.14 - 1.00 mg/dL 7.82  9.56  2.13   Sodium 135 - 145 mmol/L 137  137    Potassium 3.5 - 5.1 mmol/L 3.9  3.2    Chloride 98 - 111 mmol/L 106  105    CO2 22 - 32 mmol/L 23  22    Calcium 8.9 - 10.3 mg/dL 8.6  8.5       Imaging Reviewed:  CT head: No acute abnormalities  MRI brain: Pending, unable to perform at this time as patient has temporary pacemaker  Assessment: 77 year old patient with the above past medical history underwent cardiac cath for non-STEMI, had a hypotensive event during procedure and was found to be confused with speech disturbance out of proportion to confusion.  Her mental status has since returned to baseline, and only notable finding on her exam is hand tremor.  Patient will need MRI for stroke workup, but this cannot be done while she has her temporary pacemaker.  On exam, she is alert and oriented x 4 and able to follow simple and complex commands easily.  She  states that she feels like she is back to her mental baseline.  Suspect episode of confusion caused by hypotension in the setting of complete heart block. With lack of any neuro deficit on exam, low overall suspicion for a stroke.  Impression: Likely confusion in the setting of significant decline in SBP from 200s systolic to 70s systolic during cath.  Recommendations: - no further inpatient neurological workup. We will signoff.  Cortney E Ernestina Columbia , MSN, AGACNP-BC [facility] See Amion for schedule and pager information 12/04/2022 7:54 AM   NEUROHOSPITALIST ADDENDUM Performed a face to face diagnostic evaluation.   I have reviewed the contents of history and physical exam as documented by PA/ARNP/Resident and agree with above documentation.  I have discussed and formulated the above plan as documented. Edits to the  note have been made as needed.  Impression/Key exam findings/Plan: back to her baseline. Reports she was foggy in her head yesterday and intense nausea. Nausea was so upsetting for her that she could not focus on anything else. CT head was negative. Since she is back to her baseline and no focal deficit with intact cognition, MRI is low yield. I will cancel MRI Brain. No further inpatient neurological workup. Low overall suspicion for stroke/TIA.  We will signoff.  Erick Blinks, MD [facility] [phone]   If 7pm to 7am, please call on call as listed on AMION.

## 2022-12-04 NOTE — Progress Notes (Signed)
Instructed by neurologist on call to maintain BP less than or equal to 180/110 until MRI is completed. No other parameters given. Instructed by cardiologist to defer to neurology regarding BP.

## 2022-12-04 NOTE — Progress Notes (Addendum)
Subjective:  Feels well this morning No chest tightness Has had heartburn which she typically has when she has not eaten anything  No pacing overnight CT head with no ICH  Hypertensive overnight   Current Facility-Administered Medications:    0.9 %  sodium chloride infusion, 250 mL, Intravenous, PRN, Jaidon Ellery J, MD   acetaminophen (TYLENOL) tablet 650 mg, 650 mg, Oral, Q4H PRN, Aristeo Hankerson J, MD   amLODipine (NORVASC) tablet 5 mg, 5 mg, Oral, Daily, Pattijo Juste J, MD   Chlorhexidine Gluconate Cloth 2 % PADS 6 each, 6 each, Topical, Daily, Blake Divine, Vijaya, MD   cloNIDine (CATAPRES) tablet 0.1 mg, 0.1 mg, Oral, BID, Tiondra Fang J, MD   clopidogrel (PLAVIX) tablet 75 mg, 75 mg, Oral, Daily, Aliou Mealey J, MD   heparin injection 5,000 Units, 5,000 Units, Subcutaneous, Q8H, Josue Kass J, MD, 5,000 Units at 12/04/22 0532   hydrochlorothiazide (HYDRODIURIL) tablet 25 mg, 25 mg, Oral, q morning, Andi Layfield J, MD   insulin aspart (novoLOG) injection 0-6 Units, 0-6 Units, Subcutaneous, Q6H, Howerter, Justin B, DO, 2 Units at 12/04/22 0537   insulin glargine-yfgn (SEMGLEE) injection 18 Units, 18 Units, Subcutaneous, QHS, Howerter, Justin B, DO   melatonin tablet 3 mg, 3 mg, Oral, QHS PRN, Howerter, Justin B, DO   nitroGLYCERIN 50 mg in dextrose 5 % 250 mL (0.2 mg/mL) infusion, 0-200 mcg/min, Intravenous, Titrated, Mesner, Barbara Cower, MD, Last Rate: 16.5 mL/hr at 12/04/22 0600, 55 mcg/min at 12/04/22 0600   ondansetron (ZOFRAN) injection 4 mg, 4 mg, Intravenous, Q4H PRN, Tolia, Sunit, DO, 4 mg at 12/04/22 0738   Oral care mouth rinse, 15 mL, Mouth Rinse, PRN, Kathlen Mody, MD   pantoprazole (PROTONIX) injection 40 mg, 40 mg, Intravenous, Q24H, Howerter, Justin B, DO, 40 mg at 12/04/22 0738   sodium chloride flush (NS) 0.9 % injection 3 mL, 3 mL, Intravenous, Q12H, Jaiveon Suppes J, MD, 3 mL at 12/03/22 2342   sodium chloride flush (NS) 0.9 %  injection 3 mL, 3 mL, Intravenous, PRN, Ambert Virrueta J, MD   Objective:  Vital Signs in the last 24 hours: Temp:  [98.2 F (36.8 C)-99 F (37.2 C)] 98.8 F (37.1 C) (04/23 0400) Pulse Rate:  [0-114] 73 (04/23 0600) Resp:  [10-33] 16 (04/23 0600) BP: (102-211)/(39-157) 168/57 (04/23 0600) SpO2:  [90 %-100 %] 93 % (04/23 0600) Weight:  [71.6 kg] 71.6 kg (04/23 0500)  Intake/Output from previous day: 04/22 0701 - 04/23 0700 In: 2404 [I.V.:1964.3; IV Piggyback:439.7] Out: 1500 [Urine:1500]  Physical Exam Vitals and nursing note reviewed.  Constitutional:      General: She is not in acute distress. Neck:     Vascular: No JVD.  Cardiovascular:     Rate and Rhythm: Normal rate and regular rhythm.     Heart sounds: Normal heart sounds. No murmur heard. Pulmonary:     Effort: Pulmonary effort is normal.     Breath sounds: Rales present. No wheezing.  Musculoskeletal:     Right lower leg: No edema.     Left lower leg: No edema.  Neurological:     General: No focal deficit present.     Mental Status: She is oriented to person, place, and time.      Imaging/tests reviewed and independently interpreted: CT head 12/03/2022: No acute intracranial process. ASPECTS is 10.   Imaging results were communicated on 12/03/2022 at 6:59 pm to provider Dr. Derry Lory via secure text paging.    Cardiac Studies:  EKG 12/04/2022: Normal  sinus rhythm Nonspecific ST and T wave abnormality Prolonged QT Abnormal ECG When compared with  Telemetry 12/04/2022: No pauses or high grade AV block overnight  Coronary and bypass graft angiography, Temporary pacemaker placement 12/04/2022: LM: Normal LAD: Prox-mid 30% diffuse disease, distal 75% diffuse disease Lcx: Mid 75% diffuse disease RCA: Prox 80% diffuse disease, followed 100 mid ISR SVG-PDA: Patent SVG-diag: Could not be visualized   Temporary transvenous pacemaker placement.   Patient was very confused and kept attempting to  sit up.  I did not feel leaving femoral sheath in place was safe.  Therefore, I decided to go ahead and place 5 [place] [employer] closure.   With transient hypotension, patient was very confused and disoriented.  Code stroke was called.  Patient was evaluated by neurology and thought to be less likely to have had stroke.  CT head was ordered out of abundance caution.  Patient is mental alertness improved with time.  She will be kept into heart ICU overnight for monitoring.  Temporary pacemaker will be removed tomorrow morning if no further use is needed.   Unusually prolonged procedure time due to intraprocedural hypotension, complete AV block requiring placement of temporary transvenous placement, disorientation requiring further neurological evaluation.  EKG 12/03/2022: Sinus rhythm with occasional ventricular-paced complexes ST & T wave abnormality, consider anterolateral ischemia Prolonged QT Abnormal ECG  Echocardiogram 12/03/2022:  1. Left ventricular ejection fraction, by estimation, is 60 to 65%. The  left ventricle has normal function. There is mild concentric left  ventricular hypertrophy. Left ventricular diastolic parameters are  consistent with Grade II diastolic dysfunction  (pseudonormalization). On the 2-chamber contrast images there appears to  be a small segment of akinesis in the basilar to mid inferior wall. This  is not seen in any other images.   2. Right ventricular systolic function is normal. The right ventricular  size is normal. There is normal pulmonary artery systolic pressure.   3. Left atrial size was mildly dilated.   4. The mitral valve is normal in structure. Mild mitral valve  regurgitation. No evidence of mitral stenosis.   5. The aortic valve is tricuspid. There is mild calcification of the  aortic valve. Aortic valve regurgitation is trivial. No aortic stenosis is  present.   6. The inferior vena cava is normal in size with <50% respiratory  variability,  suggesting right atrial pressure of 8 mmHg.     Assessment & Recommendations:  77 year old Caucasian female with hypertension, diabetes mellitus, hyperlipidemia, CAD s/p CABG x 2 SVG-diagonal, SVG-PDA-performed due to recurrent ISR and RCA, in 2015, admitted with NSTEMI, intraprocedural hypotension, transient complete AV block, confusion concerning for possible stroke  NSTEMI: Peak trop HS 1100 Currently chest pain free EKG with diffuse ST depression, minimal ST elevation Hypertension as well as contrast injection likely cause for her diffuse ST changes, which also did occur during any contrast injection. I suspect she may have a lot of microvascular disease.  Known occluded mid RCA, patent SVG_PDA graft Patent LM, moderate nonobstructive disease in LAD and Lcx, occluded diag. Unable to engage SVG-diag graft (procedure aborted due to concern for acute stroke in the setting of complete AV block and precipitous drop in blood pressure).  Even if SVG-diag graft is occluded, it is unlikely that benefits of opening occluded SVG-diag graft or flush occluded native diag would outweigh benefits. If she has a culprit lesion in SVG-diag which could be amenable to revascularization, remains the only possibility where benefits of possible revascularization may outweigh the  risks. However, patient is reluctant to repeat cardiac catheterization to look at SVG_diag graft, after the events during cath on 4/22/ She is currently chest pain free. Echocardiogram showed normal EF with WMA only in known occluded RCA territory.  We have mutually decided to hold off cardiac catheterization at this time.  Continue plavix 75 mg daily (reported anaphylaxis to Aspirin).  Confusion: Occurred during the cath, following complete AV block and precipitous drop in blood pressure. This has now resolved. Patient is back to her baseline normal. CT head 4/22 showed no ICH. MRI felt not necessary given very low suspicion for stroke.  Appreciate Neurology input.  Hypertension: Uncontrolled. No permissive hypertension at this time. She is on a lot of antihypertensive medications at home, all of which had been on hold this admission. Resume clonidine 0.1 mg bid, amlodipine 5 mg daily. Resume metoprolol tartrate 50 mg bid later today, provided no AV conduction issues occur. Will resume Imdur 30 mg daily after weaning off NTG drip today. She has rales on exam likely from uncontrolled hypertension. Will get chest Xray. Gave IV lasix 40 mg once.   Complete AV block: Likely vagally mediated. Resolved within 40 min of occurrence in cath lab with no recurrence. Since presence of temp pacer is precluding MRI brain and that she does not clinically need pacer anymore, I have removed it this morning.   CRITICAL CARE Performed by: Truett Mainland   Total critical care time: 45 minutes   Critical care time was exclusive of separately billable procedures and treating other patients.   Critical care was necessary to treat or prevent imminent or life-threatening deterioration.   Critical care was time spent personally by me on the following activities: development of treatment plan with patient and/or surrogate as well as nursing, discussions with consultants, evaluation of patient's response to treatment, examination of patient, obtaining history from patient or surrogate, ordering and performing treatments and interventions, ordering and review of laboratory studies, ordering and review of radiographic studies, pulse oximetry and re-evaluation of patient's condition.   Discussed interpretation of tests and management recommendations with the primary team   Elder Negus, MD Pager: [phone] Office: [phone]

## 2022-12-04 NOTE — Evaluation (Signed)
Physical Therapy Evaluation Patient Details Name: Susan Barnett MRN: [id number] DOB: 13-Nov-1945 Today's Date: 12/04/2022  History of Present Illness  77 yo female admitted 4/21 with chest pain, NSTEMI. 4/22 Rt radial heart cath and temp pacer. 4/23 temp pacer removed. PMHx: CAD s/p CABG, dCHF, T2DM, HTN, OSA, breast CA  Clinical Impression  Pt pleasant and wanting to get OOB. Pt educated for post cath radial restrictions and on 2L on arrival but with change to new pulse ox probe able to maintain 97% on RA with activity. Pt with decreased strength and activity tolerance with pt stating decline in function over the last 2 years and would benefit from acute therapy as well as HHPT to maximize function and independence.   HR 71-82 137/54 pre gait, 119/78 post gait        Recommendations for follow up therapy are one component of a multi-disciplinary discharge planning process, led by the attending physician.  Recommendations may be updated based on patient status, additional functional criteria and insurance authorization.  Follow Up Recommendations       Assistance Recommended at Discharge PRN  Patient can return home with the following  Assistance with cooking/housework;Help with stairs or ramp for entrance    Equipment Recommendations None recommended by PT  Recommendations for Other Services       Functional Status Assessment Patient has had a recent decline in their functional status and/or demonstrates limited ability to make significant improvements in function in a reasonable and predictable amount of time     Precautions / Restrictions Precautions Precautions: Fall;Other (comment) Precaution Comments: Rt radial cath 4/22      Mobility  Bed Mobility Overal bed mobility: Needs Assistance Bed Mobility: Supine to Sit     Supine to sit: Supervision, HOB elevated     General bed mobility comments: HOB 25 with cues for restrictions of RUE s/p cath     Transfers Overall transfer level: Needs assistance   Transfers: Sit to/from Stand Sit to Stand: Min guard           General transfer comment: cues for hand placement, precaution, safety    Ambulation/Gait Ambulation/Gait assistance: Min guard Gait Distance (Feet): 160 Feet Assistive device: Rolling walker (2 wheels) Gait Pattern/deviations: Step-through pattern, Decreased stride length, Trunk flexed   Gait velocity interpretation: 1.31 - 2.62 ft/sec, indicative of limited community ambulator   General Gait Details: pt with shuffling gait at times with speed too quick for pt control needing cues for safety and proximity to [employer]    Modified Rankin (Stroke Patients Only)       Balance Overall balance assessment: History of Falls, Needs assistance   Sitting balance-Leahy Scale: Fair     Standing balance support: Bilateral upper extremity supported, Reliant on assistive device for balance, During functional activity Standing balance-Leahy Scale: Poor Standing balance comment: RW in standing                             Pertinent Vitals/Pain Pain Assessment Pain Assessment: No/denies pain    Home Living Family/patient expects to be discharged to:: Private residence Living Arrangements: Spouse/significant other Available Help at Discharge: Family;Available 24 hours/day Type of Home: House Home Access: Stairs to enter Entrance Stairs-Rails: Doctor, general practice of Steps: 2   Home Layout: One level Home Equipment: Agricultural consultant (2 wheels);Cane - single point;Shower seat -  built in;Grab bars - tub/shower      Prior Function Prior Level of Function : Needs assist             Mobility Comments: walks without AD in house and RW outside ADLs Comments: housekeeper for cleaning, able to perform ADLs and she and spouse share cooking     Hand Dominance        Extremity/Trunk Assessment    Upper Extremity Assessment Upper Extremity Assessment: Generalized weakness    Lower Extremity Assessment Lower Extremity Assessment: Generalized weakness    Cervical / Trunk Assessment Cervical / Trunk Assessment: Kyphotic  Communication   Communication: No difficulties  Cognition Arousal/Alertness: Awake/alert Behavior During Therapy: WFL for tasks assessed/performed Overall Cognitive Status: Within Functional Limits for tasks assessed                                          General Comments      Exercises     Assessment/Plan    PT Assessment Patient needs continued PT services  PT Problem List Decreased activity tolerance;Decreased balance;Decreased mobility;Decreased knowledge of use of DME       PT Treatment Interventions DME instruction;Therapeutic exercise;Gait training;Stair training;Functional mobility training;Therapeutic activities;Patient/family education;Balance training    PT Goals (Current goals can be found in the Care Plan section)  Acute Rehab PT Goals Patient Stated Goal: return home, shopping PT Goal Formulation: With patient/family Time For Goal Achievement: 12/18/22 Potential to Achieve Goals: Good    Frequency Min 1X/week     Co-evaluation               AM-PAC PT "6 Clicks" Mobility  Outcome Measure Help needed turning from your back to your side while in a flat bed without using bedrails?: A Little Help needed moving from lying on your back to sitting on the side of a flat bed without using bedrails?: A Little Help needed moving to and from a bed to a chair (including a wheelchair)?: A Little Help needed standing up from a chair using your arms (e.g., wheelchair or bedside chair)?: A Little Help needed to walk in hospital room?: A Little Help needed climbing 3-5 steps with a railing? : A Little 6 Click Score: 18    End of Session Equipment Utilized During Treatment: Gait belt;Oxygen Activity Tolerance: Patient  tolerated treatment well Patient left: with call bell/phone within reach;with chair alarm set;with nursing/sitter in room;with family/visitor present;in chair Nurse Communication: Mobility status PT Visit Diagnosis: Other abnormalities of gait and mobility (R26.89);Difficulty in walking, not elsewhere classified (R26.2)    Time: 8295-6213 PT Time Calculation (min) (ACUTE ONLY): 29 min   Charges:   PT Evaluation $PT Eval Moderate Complexity: 1 Mod PT Treatments $Therapeutic Activity: 8-22 mins        Merryl Hacker, PT Acute Rehabilitation Services Office: [phone]   Susan Barnett 12/04/2022, 1:20 PM

## 2022-12-04 NOTE — Progress Notes (Signed)
[facility]                                                                               Susan Barnett, is a 77 y.o. female, DOB - 1946-01-20, ZOX:[id number] Admit date - 12/02/2022    Outpatient Primary MD for the patient is Susan Blamer, MD  LOS - 1  days    Brief summary    TAQUILA Barnett is a 77 y.o. female with medical history significant for coronary artery disease status post two-vessel CABG, chronic diastolic heart failure, type 2 diabetes mellitus, essential hypertension, obstructive sleep apnea on home nocturnal CPAP, acquired hypothyroidism, who is admitted to [facility] on 12/02/2022 with NSTEMI after presenting from home to [facility] ED complaining of chest pain. Cardiology consulted and she was taken to cath lab. Cath showing Known occluded mid RCA, patent SVG_PDA graft, Patent LM, moderate nonobstructive disease in LAD and Lcx, SVD- diag could not be visualized, ( as procedure aborted due to concern for acute stroke in the setting of AV block and drop in SBP) During the cath, her SBP dropped  to 70's she went into CHB . Temporary pacemaker was inserted. She also became transiently confused and had some dysarthria. Code stroke was called and initial CT head was negative. MRI was initially ordered but discontinued by neurology today as she is back to her baseline without any focal deficits. Suspect the episode of confusion and speech abnormality was probably from hypotension. . Therapy evaluations ordered and pending.    Assessment & Plan    Assessment and Plan:  NSTEMI In the setting of known CAD s/p CABG. Elevated troponins and EKG with diffusion st depressions . Cardiology on board and she underwent cath showing Known occluded mid RCA, patent SVG_PDA graft, Patent LM, moderate nonobstructive disease in LAD and Lcx, SVD- diag could not be visualized, ( as procedure aborted due to concern for acute stroke in the setting of AV block and drop in SBP) During  the cath, her SBP dropped  to 70's she went into CHB . Temporary pacemaker was inserted, later removed this morning.  Patient is reluctant to underwent repeat cardiac cath to look at SVG diag graft due to events yesterday.  Echocardiogram showed normal LVEF , with wall motion abn in the known occluded RCA territory.  Recommend medical management with plavix.   Confusion and speech abnormality during cath:   She also became transiently confused and had some dysarthria. Code stroke was called and initial CT head was negative. MRI was initially ordered but discontinued by neurology today as she is back to her baseline without any focal deficits.  Neurology on board and have signed off.  Suspect the episode of confusion and speech abnormality was probably from hypotension. . Therapy evaluation have been ordered and pending.  Possible d.c in the  morning if she continues to improve without any events.     Essential Hypertension:  BP parameters have improved.  Resume clonidine 0.1  mg BID, amlodipine 5 mg daily and Hydrochlorothiazide 25 mg daily.     CHB:  Vagally mediated . Resolved within 40 min of occurrence.  Temporary pacemaker was inserted but  later on removed this morning as its not indicated.    Type 2 DM:  CBG (last 3)  Recent Labs    12/03/22 2342 12/04/22 0536 12/04/22 1120  GLUCAP 227* 242* 231*   Increase Semglee to 40 units at bedtime and SSI.  hemoglobin A1c is 8.7%.    Hypothyroidism:  Resume synthroid.    OSA on CPAP at night.     Estimated body mass index is 29.83 kg/m as calculated from the following:   Height as of this encounter: 5\' 1"  (1.549 m).   Weight as of this encounter: 71.6 kg.  Code Status: full code.  DVT Prophylaxis:  heparin injection 5,000 Units Start: 12/04/22 0600 SCD's Start: 12/03/22 1948   Level of Care: Level of care: ICU Family Communication: Updated patient's FAMILY ON THE PHONE.   Disposition Plan:     Remains inpatient  appropriate:  possible d/c home in the morning.   Procedures:  Catheterization.  Temporary pacing  Consultants:   Neurology  Cardiology.   Antimicrobials:   Anti-infectives (From admission, onward)    None        Medications  Scheduled Meds:  amLODipine  5 mg Oral Daily   Chlorhexidine Gluconate Cloth  6 each Topical Daily   cloNIDine  0.1 mg Oral BID   clopidogrel  75 mg Oral Daily   heparin  5,000 Units Subcutaneous Q8H   hydrochlorothiazide  25 mg Oral q morning   insulin aspart  0-6 Units Subcutaneous Q6H   insulin glargine-yfgn  18 Units Subcutaneous QHS   pantoprazole (PROTONIX) IV  40 mg Intravenous Q24H   sodium chloride flush  3 mL Intravenous Q12H   Continuous Infusions:  sodium chloride     nitroGLYCERIN 10 mcg/min (12/04/22 1100)   PRN Meds:.sodium chloride, acetaminophen, melatonin, ondansetron (ZOFRAN) IV, mouth rinse, sodium chloride flush    Subjective:   Susan Barnett was seen and examined today.  No new complaints.   Objective:   Vitals:   12/04/22 1015 12/04/22 1030 12/04/22 1100 12/04/22 1122  BP: (!) 143/52 (!) 140/57 (!) 156/62   Pulse: 63 63 65   Resp: 20 (!) 21 (!) 23   Temp:    98.9 F (37.2 C)  TempSrc:    Oral  SpO2: 98% 96% 94%   Weight:      Height:        Intake/Output Summary (Last 24 hours) at 12/04/2022 1131 Last data filed at 12/04/2022 1100 Gross per 24 hour  Intake 1592.92 ml  Output 1700 ml  Net -107.08 ml   Filed Weights   12/03/22 0433 12/04/22 0500  Weight: 68 kg 71.6 kg     Exam General exam: Appears calm and comfortable  Respiratory system: Clear to auscultation. Respiratory effort normal. Cardiovascular system: S1 & S2 heard, RRR. No JVD, murmurs,  Gastrointestinal system: Abdomen is nondistended, soft and nontender.  Central nervous system: Alert and oriented. No focal neurological deficits. Extremities: Symmetric 5 x 5 power. Skin: No rashes,  Psychiatry:Mood & affect appropriate.     Data  Reviewed:  I have personally reviewed following labs and imaging studies   CBC Lab Results  Component Value Date   WBC 17.4 (H) 12/04/2022   RBC 4.21 12/04/2022   HGB 12.2 12/04/2022   HCT 37.9 12/04/2022   MCV 90.0 12/04/2022   MCH 29.0 12/04/2022   PLT 298 12/04/2022   MCHC 32.2 12/04/2022   RDW 13.3 12/04/2022   LYMPHSABS 2.8 12/03/2022   MONOABS 0.9  12/03/2022   EOSABS 0.7 (H) 12/03/2022   BASOSABS 0.1 12/03/2022     Last metabolic panel Lab Results  Component Value Date   NA 137 12/04/2022   K 3.9 12/04/2022   CL 106 12/04/2022   CO2 23 12/04/2022   BUN 11 12/04/2022   CREATININE 0.73 12/04/2022   GLUCOSE 247 (H) 12/04/2022   GFRNONAA >60 12/04/2022   GFRAA 112 06/20/2020   CALCIUM 8.6 (L) 12/04/2022   PROT 6.7 12/03/2022   ALBUMIN 3.5 12/03/2022   BILITOT 0.6 12/03/2022   ALKPHOS 40 12/03/2022   AST 25 12/03/2022   ALT 18 12/03/2022   ANIONGAP 8 12/04/2022    CBG (last 3)  Recent Labs    12/03/22 2342 12/04/22 0536 12/04/22 1120  GLUCAP 227* 242* 231*      Coagulation Profile: No results for input(s): "INR", "PROTIME" in the last 168 hours.   Radiology Studies: DG CHEST PORT 1 VIEW  Result Date: 12/04/2022 CLINICAL DATA:  Shortness of breath EXAM: PORTABLE CHEST 1 VIEW COMPARISON:  12/02/2022 FINDINGS: Cardiac shadow is stable. Postsurgical changes are again seen. Right jugular sheath is now noted in satisfactory position. No pneumothorax is seen. The lungs are clear. IMPRESSION: No acute abnormality noted. Electronically Signed   By: Alcide Clever M.D.   On: 12/04/2022 10:28   CARDIAC CATHETERIZATION  Addendum Date: 12/04/2022   LM: Normal LAD: Prox-mid 30% diffuse disease, distal 75% diffuse disease Lcx: Mid 75% diffuse disease RCA: Prox 80% diffuse disease, followed 100 mid ISR SVG-PDA: Patent SVG-diag: Could not be visualized Temporary transvenous pacemaker placement. Patient was very confused and kept attempting to sit up.  I did not feel  leaving femoral sheath in place was safe.  Therefore, I decided to go ahead and place 5 [place] M[employer] closure. With transient hypotension, patient was very confused and disoriented.  Code stroke was called.  Patient was evaluated by neurology and thought to be less likely to have had stroke.  CT head was ordered out of abundance caution.  Patient is mental alertness improved with time.  She will be kept into heart ICU overnight for monitoring.  Temporary pacemaker will be removed tomorrow morning if no further use is needed. Unusually prolonged procedure time due to intraprocedural hypotension, complete AV block requiring placement of temporary transvenous placement, disorientation requiring further neurological evaluation. Elder Negus, MD Pager: [phone] Office: [phone]   Result Date: 12/04/2022 Images from the original result were not included. LM: Normal LAD: Prox-mid 30% diffuse disease, distal 75% diffuse disease Lcx: Mid 75% diffuse disease RCA: Prox 80% diffuse disease, followed 100 mid ISR SVG-PDA: Patent SVG-diag: Could not be visualized Temporary transvenous pacemaker placement. Patient was very confused and kept attempting to sit up.  I did not feel leaving femoral sheath in place was safe.  Therefore, I decided to go ahead and place 5 [place] Mynx closure. With transient hypotension, patient was very confused and disoriented.  Code stroke was called.  Patient was evaluated by neurology and thought to be less likely to have had stroke.  CT head was ordered out of abundance caution.  Patient is mental alertness improved with time.  She will be kept into heart ICU overnight for monitoring.  Temporary pacemaker will be removed tomorrow morning if no further use is needed. Unusually prolonged procedure time due to intraprocedural hypotension, complete AV block requiring placement of temporary transvenous placement, disorientation requiring further neurological evaluation. Elder Negus,  MD Pager: [phone] Office: [phone]  CT HEAD  CODE STROKE WO CONTRAST`  Result Date: 12/03/2022 CLINICAL DATA:  Code stroke.  Unable to speak, post catheterization EXAM: CT HEAD WITHOUT CONTRAST TECHNIQUE: Contiguous axial images were obtained from the base of the skull through the vertex without intravenous contrast. RADIATION DOSE REDUCTION: This exam was performed according to the departmental dose-optimization program which includes automated exposure control, adjustment of the mA and/or kV according to patient size and/or use of iterative reconstruction technique. COMPARISON:  12/18/2019 CT head FINDINGS: Brain: No evidence of acute infarction, hemorrhage, mass, mass effect, or midline shift. No hydrocephalus or extra-axial collection. Vascular: Suspect contrast in the vasculature from recent procedure. Skull: Negative for fracture or focal lesion. Sinuses/Orbits: No acute finding. Status post bilateral lens replacements. Other: The mastoid air cells are well aerated. ASPECTS [facility] Stroke Program Early CT Score) - Ganglionic level infarction (caudate, lentiform nuclei, internal capsule, insula, M1-M3 cortex): 7 - Supraganglionic infarction (M4-M6 cortex): 3 Total score (0-10 with 10 being normal): 10 IMPRESSION: No acute intracranial process. ASPECTS is 10. Imaging results were communicated on 12/03/2022 at 6:59 pm to provider Dr. Derry Lory via secure text paging. Electronically Signed   By: Wiliam Ke M.D.   On: 12/03/2022 18:59   ECHOCARDIOGRAM COMPLETE  Result Date: 12/03/2022    ECHOCARDIOGRAM REPORT   Patient Name:   Susan Barnett Date of Exam: 12/03/2022 Medical Rec #:  [id number]       Height:       61.0 in Accession #:    [id number]      Weight:       150.0 lb Date of Birth:  Apr 22, 1946       BSA:          1.671 m Patient Age:    76 years        BP:           134/55 mmHg Patient Gender: F               HR:           63 bpm. Exam Location:  Inpatient Procedure: 2D Echo, Cardiac  Doppler, Color Doppler and Intracardiac            Opacification Agent Indications:     elevated troponin  History:         Patient has prior history of Echocardiogram examinations. CAD,                  Prior CABG; Risk Factors:Diabetes and Hypertension.  Sonographer:     Mike Gip Referring Phys:  Angie Fava Diagnosing Phys: Arvilla Meres MD IMPRESSIONS  1. Left ventricular ejection fraction, by estimation, is 60 to 65%. The left ventricle has normal function. There is mild concentric left ventricular hypertrophy. Left ventricular diastolic parameters are consistent with Grade II diastolic dysfunction (pseudonormalization). On the 2-chamber contrast images there appears to be a small segment of akinesis in the basilar to mid inferior wall. This is not seen in any other images.  2. Right ventricular systolic function is normal. The right ventricular size is normal. There is normal pulmonary artery systolic pressure.  3. Left atrial size was mildly dilated.  4. The mitral valve is normal in structure. Mild mitral valve regurgitation. No evidence of mitral stenosis.  5. The aortic valve is tricuspid. There is mild calcification of the aortic valve. Aortic valve regurgitation is trivial. No aortic stenosis is present.  6. The inferior vena cava is normal in size with <50% respiratory variability, suggesting  right atrial pressure of 8 mmHg. FINDINGS  Left Ventricle: Left ventricular ejection fraction, by estimation, is 60 to 65%. The left ventricle has normal function. The left ventricle has no regional wall motion abnormalities. Definity contrast agent was given IV to delineate the left ventricular  endocardial borders. The left ventricular internal cavity size was normal in size. There is mild concentric left ventricular hypertrophy. Left ventricular diastolic parameters are consistent with Grade II diastolic dysfunction (pseudonormalization). Right Ventricle: The right ventricular size is normal. No  increase in right ventricular wall thickness. Right ventricular systolic function is normal. There is normal pulmonary artery systolic pressure. The tricuspid regurgitant velocity is 1.85 m/s, and  with an assumed right atrial pressure of 8 mmHg, the estimated right ventricular systolic pressure is 21.7 mmHg. Left Atrium: Left atrial size was mildly dilated. Right Atrium: Right atrial size was normal in size. Pericardium: There is no evidence of pericardial effusion. Mitral Valve: The mitral valve is normal in structure. Mild mitral valve regurgitation. No evidence of mitral valve stenosis. Tricuspid Valve: The tricuspid valve is normal in structure. Tricuspid valve regurgitation is trivial. No evidence of tricuspid stenosis. Aortic Valve: The aortic valve is tricuspid. There is mild calcification of the aortic valve. Aortic valve regurgitation is trivial. No aortic stenosis is present. Pulmonic Valve: The pulmonic valve was normal in structure. Pulmonic valve regurgitation is mild. No evidence of pulmonic stenosis. Aorta: The aortic root is normal in size and structure. Venous: The inferior vena cava is normal in size with less than 50% respiratory variability, suggesting right atrial pressure of 8 mmHg. IAS/Shunts: No atrial level shunt detected by color flow Doppler.  LEFT VENTRICLE PLAX 2D LVIDd:         3.60 cm      Diastology LVIDs:         2.40 cm      LV e' medial:    4.03 cm/s LV PW:         1.10 cm      LV E/e' medial:  29.0 LV IVS:        1.20 cm      LV e' lateral:   7.51 cm/s LVOT diam:     1.90 cm      LV E/e' lateral: 15.6 LV SV:         62 LV SV Index:   37 LVOT Area:     2.84 cm  LV Volumes (MOD) LV vol d, MOD A2C: 89.0 ml LV vol d, MOD A4C: 115.0 ml LV vol s, MOD A2C: 25.3 ml LV vol s, MOD A4C: 28.9 ml LV SV MOD A2C:     63.7 ml LV SV MOD A4C:     115.0 ml LV SV MOD BP:      75.7 ml RIGHT VENTRICLE            IVC RV Basal diam:  3.70 cm    IVC diam: 2.10 cm RV S prime:     9.36 cm/s TAPSE  (M-mode): 1.8 cm LEFT ATRIUM             Index        RIGHT ATRIUM           Index LA diam:        3.30 cm 1.97 cm/m   RA Area:     13.30 cm LA Vol (A2C):   40.1 ml 23.99 ml/m  RA Volume:   25.90 ml  15.50 ml/m LA Vol (A4C):  54.6 ml 32.67 ml/m LA Biplane Vol: 50.3 ml 30.09 ml/m  AORTIC VALVE LVOT Vmax:   89.00 cm/s LVOT Vmean:  59.200 cm/s LVOT VTI:    0.220 m  AORTA Ao Root diam: 2.80 cm Ao Asc diam:  3.40 cm MITRAL VALVE                TRICUSPID VALVE MV Area (PHT): 3.08 cm     TR Peak grad:   13.7 mmHg MV Decel Time: 246 msec     TR Vmax:        185.00 cm/s MV E velocity: 117.00 cm/s MV A velocity: 101.00 cm/s  SHUNTS MV E/A ratio:  1.16         Systemic VTI:  0.22 m                             Systemic Diam: 1.90 cm Arvilla Meres MD Electronically signed by Arvilla Meres MD Signature Date/Time: 12/03/2022/3:00:31 PM    Final (Updated)    DG Chest Portable 1 View  Result Date: 12/03/2022 CLINICAL DATA:  Chest pain. EXAM: PORTABLE CHEST 1 VIEW COMPARISON:  Dec 17, 2025 FINDINGS: Multiple sternal wires are noted. The heart size and mediastinal contours are within normal limits. There is moderate severity calcification of the aortic arch and tortuosity of the descending thoracic aorta. Low lung volumes are seen. Both lungs are clear. The visualized skeletal structures are unremarkable. IMPRESSION: 1. Evidence of prior median sternotomy/CABG. 2. No acute cardiopulmonary disease. Electronically Signed   By: Aram Candela M.D.   On: 12/03/2022 00:04       Kathlen Mody M.D. [facility] 12/04/2022, 11:31 AM  Available via Epic secure chat 7am-7pm After 7 pm, please refer to night coverage provider listed on amion.

## 2022-12-05 ENCOUNTER — Other Ambulatory Visit: Payer: Self-pay | Admitting: Cardiology

## 2022-12-05 ENCOUNTER — Other Ambulatory Visit: Payer: Medicare Other

## 2022-12-05 DIAGNOSIS — I442 Atrioventricular block, complete: Secondary | ICD-10-CM

## 2022-12-05 DIAGNOSIS — I214 Non-ST elevation (NSTEMI) myocardial infarction: Secondary | ICD-10-CM | POA: Diagnosis not present

## 2022-12-05 DIAGNOSIS — Z951 Presence of aortocoronary bypass graft: Secondary | ICD-10-CM

## 2022-12-05 DIAGNOSIS — I459 Conduction disorder, unspecified: Secondary | ICD-10-CM

## 2022-12-05 DIAGNOSIS — I2510 Atherosclerotic heart disease of native coronary artery without angina pectoris: Secondary | ICD-10-CM

## 2022-12-05 LAB — LDL CHOLESTEROL, DIRECT: Direct LDL: 51 mg/dL (ref 0–99)

## 2022-12-05 LAB — LIPID PANEL
Cholesterol: 114 mg/dL (ref 0–200)
HDL: 39 mg/dL — ABNORMAL LOW (ref >40)
LDL Cholesterol: 45 mg/dL (ref 0–99)
Total CHOL/HDL Ratio: 2.9 RATIO
Triglycerides: 149 mg/dL (ref <150)
VLDL: 30 mg/dL (ref 0–40)

## 2022-12-05 LAB — GLUCOSE, CAPILLARY
Glucose-Capillary: 121 mg/dL — ABNORMAL HIGH (ref 70–99)
Glucose-Capillary: 174 mg/dL — ABNORMAL HIGH (ref 70–99)
Glucose-Capillary: 219 mg/dL — ABNORMAL HIGH (ref 70–99)

## 2022-12-05 LAB — LIPOPROTEIN A (LPA): Lipoprotein (a): 165.6 nmol/L — ABNORMAL HIGH (ref <75.0)

## 2022-12-05 MED ORDER — ATORVASTATIN CALCIUM 40 MG PO TABS: 40.0000 mg | ORAL_TABLET | Freq: Every day | ORAL | 2 refills | Status: DC

## 2022-12-05 MED ORDER — EMPAGLIFLOZIN 10 MG PO TABS: 10.0000 mg | ORAL_TABLET | Freq: Every day | ORAL | 1 refills | Status: DC

## 2022-12-05 MED ORDER — METOPROLOL TARTRATE 50 MG PO TABS: 50.0000 mg | ORAL_TABLET | Freq: Two times a day (BID) | ORAL | 1 refills | Status: DC

## 2022-12-05 MED ORDER — PANTOPRAZOLE SODIUM 40 MG PO TBEC: 40.0000 mg | DELAYED_RELEASE_TABLET | Freq: Every day | ORAL | Status: DC

## 2022-12-05 MED ORDER — EMPAGLIFLOZIN 10 MG PO TABS
10.0000 mg | ORAL_TABLET | Freq: Every day | ORAL | Status: DC
  Administered 2022-12-05: 10 mg via ORAL
  Filled 2022-12-05: qty 1

## 2022-12-05 MED ORDER — CLONIDINE HCL 0.1 MG PO TABS: 0.1000 mg | ORAL_TABLET | Freq: Every day | ORAL | 11 refills | Status: DC

## 2022-12-05 MED ORDER — METOPROLOL TARTRATE 50 MG PO TABS
50.0000 mg | ORAL_TABLET | Freq: Two times a day (BID) | ORAL | Status: DC
  Administered 2022-12-05: 50 mg via ORAL
  Filled 2022-12-05: qty 1

## 2022-12-05 MED ORDER — CLONIDINE HCL 0.1 MG PO TABS: 0.1000 mg | ORAL_TABLET | Freq: Every day | ORAL | Status: DC

## 2022-12-05 MED ORDER — ISOSORBIDE MONONITRATE ER 30 MG PO TB24: 30.0000 mg | ORAL_TABLET | Freq: Every day | ORAL | Status: DC

## 2022-12-05 MED ORDER — ATORVASTATIN CALCIUM 40 MG PO TABS
40.0000 mg | ORAL_TABLET | Freq: Every day | ORAL | Status: DC
  Administered 2022-12-05: 40 mg via ORAL
  Filled 2022-12-05: qty 1

## 2022-12-05 NOTE — Progress Notes (Signed)
Pt has refused cpap 

## 2022-12-05 NOTE — Progress Notes (Signed)
Recently hospitalized for NSTEMI.  During the heart catheterization she had a transient complete heart block requiring temporary pacer.  Temporary pacemaker wire was discontinued and no significant conduction disease on telemetry.  However during discharge patient was working with rehab and was noted to have intermittent first-degree AV block.  Given the recent trajectory of events we will place an order for a Zio patch to evaluate for conduction disease.  The patient will come to the office after discharge to have it placed.  No charge.   Tessa Lerner, [place], [facility]  Pager:  [phone] Office: [phone]

## 2022-12-05 NOTE — Progress Notes (Signed)
CARDIAC REHAB PHASE I   PRE:  Rate/Rhythm: 67 SR    BP: sitting 103/58    SpO2: 98 RA  MODE:  Ambulation: 370 ft   POST:  Rate/Rhythm: 105 first deg    BP: sitting 119/49     SpO2: 98 RA  Pt stood from recliner and walked with RW. Fairly steady, benefits from RW (rollator she would push too far ahead, she has both at home). HR max 105, does look like first degree HB with ambulation. Return to NSR upon sitting and resting. No angina or palpitations.  Discussed with pt and husband MI, restrictions, diet, exercise, NTG and CRPII. Pt receptive. Will refer to [facility] CRPII (she did maintenance for 10 years until covid). [id number]  Ethelda Chick BS, ACSM-CEP 12/05/2022 11:58 AM

## 2022-12-05 NOTE — Discharge Instructions (Signed)
Advised to follow-up with primary care physician in 1 week. Advised to follow-up with cardiology Dr. Odis Hollingshead in 10 days. Medication regimen adjusted

## 2022-12-05 NOTE — Progress Notes (Signed)
Subjective:  Sitting at bedside. No chest pain. She ambulated in the unit yesterday night without any angina pectoris   Current Facility-Administered Medications:    0.9 %  sodium chloride infusion, 250 mL, Intravenous, PRN, Patwardhan, Manish J, MD   acetaminophen (TYLENOL) tablet 650 mg, 650 mg, Oral, Q4H PRN, Patwardhan, Manish J, MD   amLODipine (NORVASC) tablet 5 mg, 5 mg, Oral, Daily, Patwardhan, Manish J, MD, 5 mg at 12/05/22 1610   Chlorhexidine Gluconate Cloth 2 % PADS 6 each, 6 each, Topical, Daily, Kathlen Mody, MD, 6 each at 12/04/22 1000   [START ON 12/06/2022] cloNIDine (CATAPRES) tablet 0.1 mg, 0.1 mg, Oral, Daily, Tarez Bowns, DO   clopidogrel (PLAVIX) tablet 75 mg, 75 mg, Oral, Daily, Patwardhan, Manish J, MD, 75 mg at 12/05/22 0908   empagliflozin (JARDIANCE) tablet 10 mg, 10 mg, Oral, Daily, Fraser Busche, DO   heparin injection 5,000 Units, 5,000 Units, Subcutaneous, Q8H, Patwardhan, Manish J, MD, 5,000 Units at 12/05/22 0631   hydrochlorothiazide (HYDRODIURIL) tablet 25 mg, 25 mg, Oral, q morning, Patwardhan, Manish J, MD, 25 mg at 12/05/22 0908   insulin aspart (novoLOG) injection 0-6 Units, 0-6 Units, Subcutaneous, Q6H, Howerter, Justin B, DO, 2 Units at 12/05/22 0003   insulin glargine-yfgn (SEMGLEE) injection 40 Units, 40 Units, Subcutaneous, QHS, Kathlen Mody, MD, 40 Units at 12/04/22 2126   isosorbide mononitrate (IMDUR) 24 hr tablet 30 mg, 30 mg, Oral, Q2200, Edem Tiegs, DO   levothyroxine (SYNTHROID) tablet 75 mcg, 75 mcg, Oral, QAC breakfast, Kathlen Mody, MD, 75 mcg at 12/05/22 0907   melatonin tablet 3 mg, 3 mg, Oral, QHS PRN, Howerter, Justin B, DO   metoprolol tartrate (LOPRESSOR) tablet 50 mg, 50 mg, Oral, BID, Kaitlyne Friedhoff, DO   nitroGLYCERIN 50 mg in dextrose 5 % 250 mL (0.2 mg/mL) infusion, 0-200 mcg/min, Intravenous, Titrated, Mesner, Barbara Cower, MD, Stopped at 12/04/22 1208   ondansetron (ZOFRAN) injection 4 mg, 4 mg, Intravenous, Q4H PRN, Maryanna Stuber,  DO, 4 mg at 12/04/22 9604   Oral care mouth rinse, 15 mL, Mouth Rinse, PRN, Kathlen Mody, MD   pantoprazole (PROTONIX) injection 40 mg, 40 mg, Intravenous, Q24H, Howerter, Justin B, DO, 40 mg at 12/05/22 0908   sodium chloride flush (NS) 0.9 % injection 3 mL, 3 mL, Intravenous, Q12H, Patwardhan, Manish J, MD, 3 mL at 12/04/22 2128   sodium chloride flush (NS) 0.9 % injection 3 mL, 3 mL, Intravenous, PRN, Patwardhan, Manish J, MD   Objective:  Vital Signs in the last 24 hours: Temp:  [98.4 F (36.9 C)-99.6 F (37.6 C)] 98.4 F (36.9 C) (04/24 0700) Pulse Rate:  [58-84] 84 (04/24 0800) Resp:  [18-30] 28 (04/24 0800) BP: (98-164)/(45-115) 126/45 (04/24 0800) SpO2:  [82 %-100 %] 94 % (04/24 0800) Weight:  [69.5 kg] 69.5 kg (04/24 0500)  Intake/Output from previous day: 04/23 0701 - 04/24 0700 In: 412.3 [P.O.:360; I.V.:52.3] Out: 1250 [Urine:1250]  Net IO Since Admission: 66.35 mL [12/05/22 0957]  Today's Vitals   12/05/22 0500 12/05/22 0600 12/05/22 0700 12/05/22 0800  BP: (!) 130/54 (!) 139/48 (!) 128/54 (!) 126/45  Pulse: 77 65 71 84  Resp: (!) (!) 28  Temp:   98.4 F (36.9 C)   TempSrc:   Oral   SpO2: 97% 100% 93% 94%  Weight: 69.5 kg     Height:      PainSc:    0-No pain   Body mass index is 28.95 kg/m.   Physical Exam Vitals and  nursing note reviewed.  Constitutional:      General: She is not in acute distress. Neck:     Vascular: No JVD.  Cardiovascular:     Rate and Rhythm: Normal rate and regular rhythm.     Heart sounds: Normal heart sounds. No murmur heard. Pulmonary:     Effort: Pulmonary effort is normal.     Breath sounds: No wheezing or rales.  Musculoskeletal:     Right lower leg: No edema.     Left lower leg: No edema.  Neurological:     General: No focal deficit present.     Mental Status: She is oriented to person, place, and time.    Imaging/tests reviewed and independently interpreted: CT head 12/03/2022: No acute intracranial  process. ASPECTS is 10.   Imaging results were communicated on 12/03/2022 at 6:59 pm to provider Dr. Derry Lory via secure text paging.    Cardiac Studies:  EKG 12/04/2022: Normal sinus rhythm Nonspecific ST and T wave abnormality Prolonged QT Abnormal ECG When compared with  Telemetry 12/04/2022: Remains in sinus, no pauses or high grade AV block overnight  Coronary and bypass graft angiography, Temporary pacemaker placement 12/04/2022: LM: Normal LAD: Prox-mid 30% diffuse disease, distal 75% diffuse disease Lcx: Mid 75% diffuse disease RCA: Prox 80% diffuse disease, followed 100 mid ISR SVG-PDA: Patent SVG-diag: Could not be visualized   Temporary transvenous pacemaker placement.   Patient was very confused and kept attempting to sit up.  I did not feel leaving femoral sheath in place was safe.  Therefore, I decided to go ahead and place 5 [place] M[employer]x closure.   With transient hypotension, patient was very confused and disoriented.  Code stroke was called.  Patient was evaluated by neurology and thought to be less likely to have had stroke.  CT head was ordered out of abundance caution.  Patient is mental alertness improved with time.  She will be kept into heart ICU overnight for monitoring.  Temporary pacemaker will be removed tomorrow morning if no further use is needed.   Unusually prolonged procedure time due to intraprocedural hypotension, complete AV block requiring placement of temporary transvenous placement, disorientation requiring further neurological evaluation.  EKG 12/03/2022: Sinus rhythm with occasional ventricular-paced complexes ST & T wave abnormality, consider anterolateral ischemia Prolonged QT Abnormal ECG  Echocardiogram 12/03/2022:  1. Left ventricular ejection fraction, by estimation, is 60 to 65%. The  left ventricle has normal function. There is mild concentric left  ventricular hypertrophy. Left ventricular diastolic parameters are  consistent  with Grade II diastolic dysfunction  (pseudonormalization). On the 2-chamber contrast images there appears to  be a small segment of akinesis in the basilar to mid inferior wall. This  is not seen in any other images.   2. Right ventricular systolic function is normal. The right ventricular  size is normal. There is normal pulmonary artery systolic pressure.   3. Left atrial size was mildly dilated.   4. The mitral valve is normal in structure. Mild mitral valve  regurgitation. No evidence of mitral stenosis.   5. The aortic valve is tricuspid. There is mild calcification of the  aortic valve. Aortic valve regurgitation is trivial. No aortic stenosis is  present.   6. The inferior vena cava is normal in size with <50% respiratory  variability, suggesting right atrial pressure of 8 mmHg.     Assessment & Recommendations:  77 year old Caucasian female with hypertension, diabetes mellitus, hyperlipidemia, CAD s/p CABG x 2 SVG-diagonal, SVG-PDA-performed due to  recurrent ISR and RCA, in 2015, admitted with NSTEMI, intraprocedural hypotension, transient complete AV block, confusion concerning for possible stroke  NSTEMI: Peak trop HS 1150 Currently chest pain free EKG with diffuse ST depression, minimal ST elevation Hypertension as well as contrast injection likely cause for her diffuse ST changes, which also did occur during any contrast injection. I suspect she may have a lot of microvascular disease.  Known occluded mid RCA, patent SVG_PDA graft Patent LM, moderate nonobstructive disease in LAD and Lcx, occluded diag. Unable to engage SVG-diag graft (procedure aborted due to concern for acute stroke in the setting of complete AV block and precipitous drop in blood pressure). Interventional cardiology spoke to the patient with regards to proceeding with heart catheterization to further evaluate SVG to diagonal branch.  However, given the recent course of events during the prior Patient  remains hesitant.  She prefers medical therapy for now. Echocardiogram showed normal EF with WMA only in known occluded RCA territory. Continue plavix 75 mg daily (reported anaphylaxis to Aspirin). Start Lipitor 40 mg p.o. nightly Check fasting lipid profile, direct LDL, LP(a) for further risk stratification Will continue to uptitrate antihypertensive/antianginal therapy.  Confusion: Resolved. Code stroke was called-evaluated by neurology Likely secondary to hypotension and transient complete heart block  Hypertension: Recent blood pressure trends are within acceptable limits. Given the concerns for possible stroke permissive hypertension was recommended. Home antihypertensive medications include:  Amlodipine 5 mg p.o. daily. Clonidine 0.1 mg p.o. twice daily. Hydrochlorothiazide 25 mg p.o. every morning. Imdur 30 mg p.o. every afternoon. Losartan 100 mg p.o. daily. Lopressor 100 mg p.o. twice daily.  Recommended antihypertensive medications at discharge: Amlodipine 5 mg p.o. daily - may help w/ vasospasm.  We will decrease clonidine to 0.1 mg p.o. daily with goals of weaning off Start Jardiance 10 mg p.o. daily Continue hydrochlorothiazide 25 mg p.o. daily Continue Imdur 30 mg p.o. every afternoon Lopressor 50 mg p.o. twice daily -dose reduced given the recent complete heart block May instruct her to restart losartan 100 mg p.o. daily if SBP greater than 130 mmHg at home.   Given her grade 2 diastolic dysfunction would like to uptitrate GDMT.  Eventually transition losartan to Entresto and transition HCTZ to spironolactone.  Complete AV block: Likely secondary to transient hypotension and beta-blockade open (home dose Lopressor 100 mg twice daily) Was given temporary pacemaker Temporary pacemaker removed 12/04/2022 No recurrence of conduction disease on telemetry over the last 24 hours.  From a cardiovascular standpoint patient can be discharged home.  Outpatient follow-up  scheduled for 12/13/2022. If she remains inpatient for other reasons we will continue to follow and titrate medical therapy.  Discussed interpretation of tests and management recommendations with the primary team  Delilah Shan [facility]  Pager:  [phone] Office: [phone]

## 2022-12-05 NOTE — TOC Initial Note (Signed)
Transition of Care [facility]) - Initial/Assessment Note    Patient Details  Name: Susan Barnett MRN: [id number] Date of Birth: 05/17/1946  Transition of [facility]) CM/SW Contact:    Gala Lewandowsky, RN Phone Number: 12/05/2022, 11:34 AM  Clinical Narrative: Patient presented for chest pain. Plan to transition home today with [facility]. Case Manager spoke with patient and spouse at the bedside and patient has used [facility] in the past for services. Case Manager submitted referral for [facility] PT/OT. Awaiting callback from [facility]. No DME needs identified. Patient has rollator, rolling walker, and cane.                   Expected Discharge Plan: Home w Home Health Services Barriers to Discharge: No Barriers Identified   Patient Goals and CMS Choice Patient states their goals for this hospitalization and ongoing recovery are:: to return [place]   Choice offered to / list presented to : Patient (no list needed preference was [facility].)      Expected Discharge Plan and Services In-house Referral: NA Discharge Planning Services: CM Consult Post Acute Care Choice: Home Health Living arrangements for the past 2 months: Single Family Home Expected Discharge Date: 12/05/22                 DME Agency: NA       HH Arranged: PT, OT HH Agency: [facility] ([facility]) Date HH Agency Contacted: 12/05/22 Time HH Agency Contacted: 1119 Representative spoke with at [facility] Agency: Morrie Sheldon  Prior Living Arrangements/Services Living arrangements for the past 2 months: Single Family Home Lives with:: Spouse Patient language and need for interpreter reviewed:: Yes Do you feel safe going back to the place where you live?: Yes      Need for Family Participation in Patient Care: Yes (Comment) Care giver support system in place?: Yes (comment) Current home services: DME (patient has rollator, rolling walker and cane) Criminal Activity/Legal Involvement Pertinent to Current  Situation/Hospitalization: No - Comment as needed  Activities of Daily Living      Permission Sought/Granted Permission sought to share information with : Family Supports, Magazine features editor, Case Estate manager/land agent granted to share information with : Yes, Verbal Permission Granted     Permission granted to share info w AGENCY: [facility]        Emotional Assessment Appearance:: Appears stated age Attitude/Demeanor/Rapport: Engaged Affect (typically observed): Appropriate Orientation: : Oriented to Situation, Oriented to  Time, Oriented to Place, Oriented to Self Alcohol / Substance Use: Not Applicable Psych Involvement: No (comment)  Admission diagnosis:  NSTEMI (non-ST elevated myocardial infarction) [I21.4] Patient Active Problem List   Diagnosis Date Noted   Transient complete heart block 12/05/2022   NSTEMI (non-ST elevated myocardial infarction) 12/03/2022   Chest pain 12/03/2022   Hypokalemia 12/03/2022   Chronic diastolic CHF (congestive heart failure) 12/03/2022   Hx of CABG 12/03/2022   DM type 2 with diabetic mixed hyperlipidemia 12/03/2022   Sleep apnea in adult 12/03/2022   Atherosclerosis of native coronary artery of native heart with unstable angina pectoris 12/03/2022   Hyponatremia 12/18/2019   Iron deficiency anemia 08/20/2017   Obstructive sleep apnea 05/16/2017   Genetic testing 04/12/2017   Malignant neoplasm of upper-outer quadrant of left breast in female, estrogen receptor positive 10/15/2016   Hyperlipidemia 07/14/2015   Coronary artery disease involving coronary bypass graft of native heart with angina pectoris 06/14/2014   Lateral meniscal tear 02/23/2014   GASTRIC POLYP 11/19/2007   Acquired hypothyroidism 11/19/2007  DM2 (diabetes mellitus, type 2) 11/19/2007   Benign hypertension 11/19/2007   GERD (gastroesophageal reflux disease) 11/19/2007   HIATAL HERNIA 11/19/2007   RECTAL BLEEDING 11/19/2007   PCP:  Johny Blamer,  MD Pharmacy:   [facility] [address] [place] - [address] [place] [address] Phone: [phone] Fax: [phone]  [facility] [address] [place] [address] Phone: [phone] Fax: [phone]  [facility] - [place], [place] - [address] [place] [address] Phone: [phone] Fax: [phone]  Social Determinants of Health (SDOH) Social History: SDOH Screenings   Tobacco Use: Low Risk  (12/04/2022)   SDOH Interventions:     Readmission Risk Interventions     No data to display

## 2022-12-05 NOTE — Evaluation (Addendum)
Occupational Therapy Evaluation Patient Details Name: Susan Barnett MRN: [id number] DOB: 24-Oct-1945 Today's Date: 12/05/2022   History of Present Illness 77 yo female admitted 4/21 with chest pain, NSTEMI. 4/22 Rt radial heart cath and temp pacer. 4/23 temp pacer removed. Code stroke activated during heart cath due to decreased command follow, CT with no acute intracranial process. PMHx: CAD s/p CABG, dCHF, T2DM, HTN, OSA, breast CA   Clinical Impression   Pt independent at baseline with ADLs and uses RW for community mobility, pt lives with spouse who can assist at d/c. Pt currently limited by elevated HR during session, up to 140bpm with ambulation to bathroom for toileting, RN notified. Pt mod I -min A for ADLs, supervision for bed mobility, and min guard -min A for transfers with RW. Pt needing increased assist to stand from lower surfaces (commode). Pt presenting with impairments listed below, will follow acutely. Recommend HHOT at d/c.     Recommendations for follow up therapy are one component of a multi-disciplinary discharge planning process, led by the attending physician.  Recommendations may be updated based on patient status, additional functional criteria and insurance authorization.   Assistance Recommended at Discharge Set up Supervision/Assistance  Patient can return home with the following A little help with bathing/dressing/bathroom;Assistance with cooking/housework;Assist for transportation;Help with stairs or ramp for entrance    Functional Status Assessment  Patient has had a recent decline in their functional status and demonstrates the ability to make significant improvements in function in a reasonable and predictable amount of time.  Equipment Recommendations  None recommended by OT (pt has all needed DME)    Recommendations for Other Services PT consult     Precautions / Restrictions Precautions Precautions: Fall;Other (comment) Precaution Comments: Rt radial  cath 4/22, watch HR Restrictions Weight Bearing Restrictions: No      Mobility Bed Mobility Overal bed mobility: Needs Assistance Bed Mobility: Supine to Sit     Supine to sit: Supervision, HOB elevated     General bed mobility comments: cues for RUE restrictions from heart cath    Transfers Overall transfer level: Needs assistance Equipment used: Rolling walker (2 wheels) Transfers: Sit to/from Stand Sit to Stand: Min guard, Min assist                  Balance Overall balance assessment: History of Falls, Needs assistance Sitting-balance support: Feet supported Sitting balance-Leahy Scale: Good Sitting balance - Comments: sits EOB and on commode without UE support   Standing balance support: Bilateral upper extremity supported, Reliant on assistive device for balance, During functional activity Standing balance-Leahy Scale: Poor Standing balance comment: reliant on external support                           ADL either performed or assessed with clinical judgement   ADL Overall ADL's : Needs assistance/impaired Eating/Feeding: Modified independent;Sitting   Grooming: Wash/dry hands;Standing;Min guard Grooming Details (indicate cue type and reason): standing at sink Upper Body Bathing: Minimal assistance;Sitting   Lower Body Bathing: Minimal assistance;Sitting/lateral leans   Upper Body Dressing : Minimal assistance;Standing   Lower Body Dressing: Minimal assistance;Sitting/lateral leans;Sit to/from stand   Toilet Transfer: Min guard;Ambulation;Regular Toilet;Rolling walker (2 wheels);Minimal assistance   Toileting- Clothing Manipulation and Hygiene: Supervision/safety       Functional mobility during ADLs: Min guard;Rolling walker (2 wheels)       Vision   Vision Assessment?: No apparent visual deficits  Perception Perception Perception Tested?: No   Praxis Praxis Praxis tested?: Not tested    Pertinent Vitals/Pain Pain  Assessment Pain Assessment: No/denies pain     Hand Dominance Right   Extremity/Trunk Assessment Upper Extremity Assessment Upper Extremity Assessment: Generalized weakness   Lower Extremity Assessment Lower Extremity Assessment: Generalized weakness   Cervical / Trunk Assessment Cervical / Trunk Assessment: Kyphotic   Communication Communication Communication: No difficulties   Cognition Arousal/Alertness: Awake/alert Behavior During Therapy: WFL for tasks assessed/performed Overall Cognitive Status: Within Functional Limits for tasks assessed                                       General Comments  HR up to 110-140bpm during session    Exercises     Shoulder Instructions      Home Living Family/patient expects to be discharged to:: Private residence Living Arrangements: Spouse/significant other Available Help at Discharge: Family;Available 24 hours/day Type of Home: House Home Access: Stairs to enter [employer] of Steps: 2 Entrance Stairs-Rails: Right;Left Home Layout: One level     Bathroom Shower/Tub: Producer, television/film/video: Standard     Home Equipment: Agricultural consultant (2 wheels);Cane - single point;Shower seat - built in;Grab bars - tub/shower          Prior Functioning/Environment Prior Level of Function : Needs assist;Driving             Mobility Comments: walks without AD in house and RW outside ADLs Comments: housekeeper for cleaning, able to perform ADLs and she and spouse share cooking, manages own medications, drives        OT Problem List: Decreased strength;Decreased range of motion;Decreased activity tolerance;Impaired balance (sitting and/or standing);Cardiopulmonary status limiting activity;Decreased knowledge of precautions      OT Treatment/Interventions: Self-care/ADL training;Therapeutic exercise;Energy conservation;DME and/or AE instruction;Therapeutic activities;Patient/family  education;Balance training    OT Goals(Current goals can be found in the care plan section) Acute Rehab OT Goals Patient Stated Goal: to get stronger OT Goal Formulation: With patient Time For Goal Achievement: 12/26/22 Potential to Achieve Goals: Good ADL Goals Pt Will Perform Lower Body Dressing: with supervision;sitting/lateral leans;sit to/from stand;bed level Pt Will Perform Tub/Shower Transfer: Shower transfer;with supervision;ambulating;shower seat Additional ADL Goal #1: pt will verbalize 3 energy conservation strategies in prep for ADLs  OT Frequency: Min 1X/week    Co-evaluation              AM-PAC OT "6 Clicks" Daily Activity     Outcome Measure Help from another person eating meals?: None Help from another person taking care of personal grooming?: A Little Help from another person toileting, which includes using toliet, bedpan, or urinal?: A Little Help from another person bathing (including washing, rinsing, drying)?: A Little Help from another person to put on and taking off regular upper body clothing?: A Little Help from another person to put on and taking off regular lower body clothing?: A Little 6 Click Score: 19   End of Session Equipment Utilized During Treatment: Gait belt;Rolling walker (2 wheels) Nurse Communication: Mobility status;Other (comment) (increased HR)  Activity Tolerance: Patient tolerated treatment well Patient left: in chair;with call bell/phone within reach;with chair alarm set;with nursing/sitter in room  OT Visit Diagnosis: Unsteadiness on feet (R26.81);Other abnormalities of gait and mobility (R26.89);Muscle weakness (generalized) (M62.81);History of falling (Z91.81)  Time: 1610-9604 OT Time Calculation (min): 20 min Charges:  OT General Charges $OT Visit: 1 Visit OT Evaluation $OT Eval Moderate Complexity: 1 Mod  Maui Ahart K, OTD, OTR/L SecureChat Preferred Acute Rehab [phone]  Merritt Mccravy K  K[place]ce 12/05/2022, 9:30 AM

## 2022-12-05 NOTE — Discharge Summary (Signed)
Physician Discharge Summary  Susan Barnett:[id number] DOB: October 23, 1945 DOA: 12/02/2022  PCP: Johny Blamer, MD  Admit date: 12/02/2022  Discharge date: 12/05/2022  Admitted From: Home.  Disposition:  Home Health .  Recommendations for Outpatient Follow-up:  Follow up with PCP in 1-2 weeks Please obtain BMP/CBC in one week Advised to follow-up with cardiology Dr. Odis Hollingshead in 10 days. Medication regimen adjusted.  Home Health: Home PT/OT Equipment/Devices:None  Discharge Condition: Stable CODE STATUS:Full code Diet recommendation: Heart Healthy   [facility] Course: This 77 y.o. female with medical history significant for coronary artery disease status post two-vessel CABG, chronic diastolic heart failure, type 2 diabetes mellitus, essential hypertension, obstructive sleep apnea on home nocturnal CPAP, acquired hypothyroidism, who was admitted with NSTEMI. Cardiology was consulted and she was taken to cath lab. Cath showing Known occluded mid RCA, patent SVG_PDA graft, Patent LM, moderate nonobstructive disease in LAD and Lcx, SVD- diag could not be visualized,  ( Procedure was aborted due to concern for acute stroke in the setting of AV block and drop in SBP) During the cath, her SBP dropped  to 70's. She went into CHB . Temporary pacemaker was inserted. She also became transiently confused and had some dysarthria. Code stroke was called and initial CT head was negative. MRI was initially ordered but discontinued by neurology today as she is back to her baseline without any focal deficits. Suspect the episode of confusion and speech abnormality was probably from hypotension.  Patient continued to make significant improvement.  Medications were adjusted. temporary pacemaker was removed.  Patient participated with physical therapy recommended home health services.  Patient cleared from cardiology to be discharged.  Discharge medications: Amlodipine 5 mg p.o. daily - may help w/  vasospasm.  Decrease clonidine to 0.1 mg p.o. daily with goals of weaning off. Start Jardiance 10 mg p.o. daily Continue hydrochlorothiazide 25 mg p.o. daily Continue Imdur 30 mg p.o. every afternoon Lopressor 50 mg p.o. twice daily -dose reduced given the recent complete heart block May instruct her to restart losartan 100 mg p.o. daily if SBP greater than 130 mmHg at home.  Given her grade 2 diastolic dysfunction would like to uptitrate GDMT.   Eventually transition losartan to Entresto and transition HCTZ to spironolactone.  Discharge Diagnoses:  Principal Problem:   NSTEMI (non-ST elevated myocardial infarction) Active Problems:   Acquired hypothyroidism   DM2 (diabetes mellitus, type 2)   Benign hypertension   GERD (gastroesophageal reflux disease)   Obstructive sleep apnea   Chest pain   Hypokalemia   Chronic diastolic CHF (congestive heart failure)   Hx of CABG   DM type 2 with diabetic mixed hyperlipidemia   Sleep apnea in adult   Atherosclerosis of native coronary artery of native heart with unstable angina pectoris   Transient complete heart block  Assessment and plan:  NSTEMI In the setting of known CAD s/p CABG. Elevated troponins and EKG with diffusion st depressions . Cardiology on board and she underwent cath showing Known occluded mid RCA, patent SVG_PDA graft, Patent LM, moderate nonobstructive disease in LAD and Lcx, SVD- diag could not be visualized, ( as procedure aborted due to concern for acute stroke in the setting of AV block and drop in SBP) During the cath, her SBP dropped  to 70's she went into CHB . Temporary pacemaker was inserted, later removed this morning.  Patient is reluctant to underwent repeat cardiac cath to look at SVG diag graft due to events yesterday.  Echocardiogram showed normal LVEF , with wall motion abn in the known occluded RCA territory.  Cardiology recommended medical management with plavix. Medication Adjusted.   Confusion and  speech abnormality during cath:  She also became transiently confused and had some dysarthria.  Code stroke was called and initial CT head was negative.  MRI was initially ordered but discontinued by neurology today as she is back to her baseline without any focal deficits.  Neurology on board and have signed off.  Suspect the episode of confusion and speech abnormality was probably from hypotension. . Therapy evaluation have been ordered and pending.  Possible d.c in the  morning if she continues to improve without any events.    Essential Hypertension:  BP parameters have improved.  Resume clonidine 0.1  mg BID, amlodipine 5 mg daily and Hydrochlorothiazide 25 mg daily.   CHB:  Vagally mediated . Resolved within 40 min of occurrence.  Temporary pacemaker was inserted but later on removed this morning as its not indicated.      Type 2 DM:  CBG (last 3)  Recent Labs (last 2 labs)       Recent Labs    12/03/22 2342 12/04/22 0536 12/04/22 1120  GLUCAP 227* 242* 231*      Increase Semglee to 40 units at bedtime and SSI.  hemoglobin A1c is 8.7%.      Hypothyroidism:  Resume synthroid.      OSA on CPAP at night.     Estimated body mass index is 29.83 kg/m as calculated from the following:   Height as of this encounter: 5\' 1"  (1.549 m).   Weight as of this encounter: 71.6 kg.  Discharge Instructions  Discharge Instructions     AMB referral to Phase II Cardiac Rehabilitation   Complete by: As directed    Currently unstable   Diagnosis: NSTEMI   After initial evaluation and assessments completed: Virtual Based Care may be provided alone or in conjunction with Phase 2 Cardiac Rehab based on patient barriers.: Yes   Intensive Cardiac Rehabilitation (ICR) [facility] location only OR Traditional Cardiac Rehabilitation (TCR) *If criteria for ICR are not met will enroll in TCR [facility] only): Yes   Call MD for:  difficulty breathing, headache or visual disturbances   Complete by: As  directed    Call MD for:  persistant dizziness or light-headedness   Complete by: As directed    Call MD for:  persistant nausea and vomiting   Complete by: As directed    Diet - low sodium heart healthy   Complete by: As directed    Discharge instructions   Complete by: As directed    Advised to follow-up with primary care physician in 1 week. Advised to follow-up with cardiology Dr. Odis Hollingshead in 10 days. Medication regimen adjusted   Increase activity slowly   Complete by: As directed       Allergies as of 12/05/2022       Reactions   Asa [aspirin] Anaphylaxis   Compazine [prochlorperazine Edisylate] Swelling, Other (See Comments)   Tongue swelling    Nsaids Hives, Swelling   Shellfish Allergy Hives, Swelling   Sulfa Antibiotics Hives, Swelling   Zantac [ranitidine Hcl] Hives, Swelling   Lisinopril Cough        Medication List     STOP taking these medications    Antifungal 2 % powder Generic drug: miconazole   hydrochlorothiazide 25 MG tablet Commonly known as: HYDRODIURIL       TAKE these  medications    acetaminophen 650 MG CR tablet Commonly known as: TYLENOL Take 1,300 mg by mouth in the morning and at bedtime.   Align 4 MG Caps Take 4 mg by mouth daily.   amLODipine 5 MG tablet Commonly known as: NORVASC Take 5 mg by mouth daily.   atorvastatin 40 MG tablet Commonly known as: LIPITOR Take 1 tablet (40 mg total) by mouth daily. Start taking on: December 06, 2022 What changed:  medication strength how much to take when to take this   CALCIUM PO Take 1 tablet by mouth in the morning and at bedtime.   cloNIDine 0.1 MG tablet Commonly known as: CATAPRES Take 1 tablet (0.1 mg total) by mouth daily. Start taking on: December 06, 2022 What changed: See the new instructions.   clopidogrel 75 MG tablet Commonly known as: PLAVIX Take 75 mg by mouth daily.   Coenzyme Q10 300 MG Caps Take 300 mg by mouth 2 (two) times daily.   D-Mannose 500 MG  Caps Take 500 mg by mouth in the morning and at bedtime.   empagliflozin 10 MG Tabs tablet Commonly known as: JARDIANCE Take 1 tablet (10 mg total) by mouth daily. Start taking on: December 06, 2022   isosorbide mononitrate 30 MG 24 hr tablet Commonly known as: IMDUR Take 1 tablet (30 mg total) by mouth daily at 10 pm.   Lantus SoloStar 100 UNIT/ML Solostar Pen Generic drug: insulin glargine Inject 40 Units into the skin at bedtime. What changed: how much to take   levothyroxine 75 MCG tablet Commonly known as: SYNTHROID Take 75 mcg by mouth daily before breakfast.   losartan 100 MG tablet Commonly known as: COZAAR Take 1 tablet (100 mg total) by mouth daily.   metoprolol tartrate 50 MG tablet Commonly known as: LOPRESSOR Take 1 tablet (50 mg total) by mouth 2 (two) times daily. What changed: See the new instructions.   MULTIVITAMIN ADULT PO Take 1 tablet by mouth daily.   nitroGLYCERIN 0.4 MG SL tablet Commonly known as: NITROSTAT Place 1 tablet (0.4 mg total) under the tongue every 5 (five) minutes as needed for chest pain.   OneTouch Verio test strip Generic drug: glucose blood 1 each 2 (two) times daily.   pantoprazole 40 MG tablet Commonly known as: PROTONIX Take 1 tablet (40 mg total) by mouth daily before breakfast.   sitaGLIPtin-metformin 50-1000 MG tablet Commonly known as: JANUMET Take 1 tablet by mouth 2 (two) times daily with a meal.   ZyrTEC Allergy 10 MG Caps Generic drug: Cetirizine HCl Take 1 tablet by mouth daily.        Follow-up Information     Johny Blamer, MD Follow up.   Specialty: Family Medicine Contact information: [phone] [address] [place] [address] [phone]         Tessa Lerner, DO Follow up on 12/13/2022.   Specialties: Cardiology, Vascular Surgery Why: 11:45am  Bring the caridac medication bottles at the next office visit to help facilitate a more accurate medication reconciliation. Contact  information: [address] [place] [address] [phone]                Allergies  Allergen Reactions   Asa [Aspirin] Anaphylaxis   Compazine [Prochlorperazine Edisylate] Swelling and Other (See Comments)    Tongue swelling    Nsaids Hives and Swelling   Shellfish Allergy Hives and Swelling   Sulfa Antibiotics Hives and Swelling   Zantac [Ranitidine Hcl]  Hives and Swelling   Lisinopril Cough    Consultations: Cardiology   Procedures/Studies: DG CHEST PORT 1 VIEW  Result Date: 12/04/2022 CLINICAL DATA:  Shortness of breath EXAM: PORTABLE CHEST 1 VIEW COMPARISON:  12/02/2022 FINDINGS: Cardiac shadow is stable. Postsurgical changes are again seen. Right jugular sheath is now noted in satisfactory position. No pneumothorax is seen. The lungs are clear. IMPRESSION: No acute abnormality noted. Electronically Signed   By: Alcide Clever M.D.   On: 12/04/2022 10:28   CARDIAC CATHETERIZATION  Addendum Date: 12/04/2022   LM: Normal LAD: Prox-mid 30% diffuse disease, distal 75% diffuse disease Lcx: Mid 75% diffuse disease RCA: Prox 80% diffuse disease, followed 100 mid ISR SVG-PDA: Patent SVG-diag: Could not be visualized Temporary transvenous pacemaker placement. Patient was very confused and kept attempting to sit up.  I did not feel leaving femoral sheath in place was safe.  Therefore, I decided to go ahead and place 5 [place] Mynx closure. With transient hypotension, patient was very confused and disoriented.  Code stroke was called.  Patient was evaluated by neurology and thought to be less likely to have had stroke.  CT head was ordered out of abundance caution.  Patient is mental alertness improved with time.  She will be kept into heart ICU overnight for monitoring.  Temporary pacemaker will be removed tomorrow morning if no further use is needed. Unusually prolonged procedure time due to intraprocedural hypotension, complete AV block requiring placement of temporary  transvenous placement, disorientation requiring further neurological evaluation. Elder Negus, MD Pager: [phone] Office: [phone]   Result Date: 12/04/2022 Images from the original result were not included. LM: Normal LAD: Prox-mid 30% diffuse disease, distal 75% diffuse disease Lcx: Mid 75% diffuse disease RCA: Prox 80% diffuse disease, followed 100 mid ISR SVG-PDA: Patent SVG-diag: Could not be visualized Temporary transvenous pacemaker placement. Patient was very confused and kept attempting to sit up.  I did not feel leaving femoral sheath in place was safe.  Therefore, I decided to go ahead and place 5 [place] Mynx closure. With transient hypotension, patient was very confused and disoriented.  Code stroke was called.  Patient was evaluated by neurology and thought to be less likely to have had stroke.  CT head was ordered out of abundance caution.  Patient is mental alertness improved with time.  She will be kept into heart ICU overnight for monitoring.  Temporary pacemaker will be removed tomorrow morning if no further use is needed. Unusually prolonged procedure time due to intraprocedural hypotension, complete AV block requiring placement of temporary transvenous placement, disorientation requiring further neurological evaluation. Elder Negus, MD Pager: [phone] Office: [phone]  CT HEAD CODE STROKE WO CONTRAST`  Result Date: 12/03/2022 CLINICAL DATA:  Code stroke.  Unable to speak, post catheterization EXAM: CT HEAD WITHOUT CONTRAST TECHNIQUE: Contiguous axial images were obtained from the base of the skull through the vertex without intravenous contrast. RADIATION DOSE REDUCTION: This exam was performed according to the departmental dose-optimization program which includes automated exposure control, adjustment of the mA and/or kV according to patient size and/or use of iterative reconstruction technique. COMPARISON:  12/18/2019 CT head FINDINGS: Brain: No evidence of  acute infarction, hemorrhage, mass, mass effect, or midline shift. No hydrocephalus or extra-axial collection. Vascular: Suspect contrast in the vasculature from recent procedure. Skull: Negative for fracture or focal lesion. Sinuses/Orbits: No acute finding. Status post bilateral lens replacements. Other: The mastoid air cells are well aerated. ASPECTS [facility] Stroke Program Early CT Score) - Ganglionic  level infarction (caudate, lentiform nuclei, internal capsule, insula, M1-M3 cortex): 7 - Supraganglionic infarction (M4-M6 cortex): 3 Total score (0-10 with 10 being normal): 10 IMPRESSION: No acute intracranial process. ASPECTS is 10. Imaging results were communicated on 12/03/2022 at 6:59 pm to provider Dr. Derry Lory via secure text paging. Electronically Signed   By: Wiliam Ke M.D.   On: 12/03/2022 18:59   ECHOCARDIOGRAM COMPLETE  Result Date: 12/03/2022    ECHOCARDIOGRAM REPORT   Patient Name:   TIFANNY DOLLENS Date of Exam: 12/03/2022 Medical Rec #:  [id number]       Height:       61.0 in Accession #:    [id number]      Weight:       150.0 lb Date of Birth:  [birth year]       BSA:          1.671 m Patient Age:    76 years        BP:           134/55 mmHg Patient Gender: F               HR:           63 bpm. Exam Location:  Inpatient Procedure: 2D Echo, Cardiac Doppler, Color Doppler and Intracardiac            Opacification Agent Indications:     elevated troponin  History:         Patient has prior history of Echocardiogram examinations. CAD,                  Prior CABG; Risk Factors:Diabetes and Hypertension.  Sonographer:     Mike Gip Referring Phys:  Angie Fava Diagnosing Phys: Arvilla Meres MD IMPRESSIONS  1. Left ventricular ejection fraction, by estimation, is 60 to 65%. The left ventricle has normal function. There is mild concentric left ventricular hypertrophy. Left ventricular diastolic parameters are consistent with Grade II diastolic dysfunction (pseudonormalization). On the  2-chamber contrast images there appears to be a small segment of akinesis in the basilar to mid inferior wall. This is not seen in any other images.  2. Right ventricular systolic function is normal. The right ventricular size is normal. There is normal pulmonary artery systolic pressure.  3. Left atrial size was mildly dilated.  4. The mitral valve is normal in structure. Mild mitral valve regurgitation. No evidence of mitral stenosis.  5. The aortic valve is tricuspid. There is mild calcification of the aortic valve. Aortic valve regurgitation is trivial. No aortic stenosis is present.  6. The inferior vena cava is normal in size with <50% respiratory variability, suggesting right atrial pressure of 8 mmHg. FINDINGS  Left Ventricle: Left ventricular ejection fraction, by estimation, is 60 to 65%. The left ventricle has normal function. The left ventricle has no regional wall motion abnormalities. Definity contrast agent was given IV to delineate the left ventricular  endocardial borders. The left ventricular internal cavity size was normal in size. There is mild concentric left ventricular hypertrophy. Left ventricular diastolic parameters are consistent with Grade II diastolic dysfunction (pseudonormalization). Right Ventricle: The right ventricular size is normal. No increase in right ventricular wall thickness. Right ventricular systolic function is normal. There is normal pulmonary artery systolic pressure. The tricuspid regurgitant velocity is 1.85 m/s, and  with an assumed right atrial pressure of 8 mmHg, the estimated right ventricular systolic pressure is 21.7 mmHg. Left Atrium: Left atrial size was mildly dilated. Right  Atrium: Right atrial size was normal in size. Pericardium: There is no evidence of pericardial effusion. Mitral Valve: The mitral valve is normal in structure. Mild mitral valve regurgitation. No evidence of mitral valve stenosis. Tricuspid Valve: The tricuspid valve is normal in  structure. Tricuspid valve regurgitation is trivial. No evidence of tricuspid stenosis. Aortic Valve: The aortic valve is tricuspid. There is mild calcification of the aortic valve. Aortic valve regurgitation is trivial. No aortic stenosis is present. Pulmonic Valve: The pulmonic valve was normal in structure. Pulmonic valve regurgitation is mild. No evidence of pulmonic stenosis. Aorta: The aortic root is normal in size and structure. Venous: The inferior vena cava is normal in size with less than 50% respiratory variability, suggesting right atrial pressure of 8 mmHg. IAS/Shunts: No atrial level shunt detected by color flow Doppler.  LEFT VENTRICLE PLAX 2D LVIDd:         3.60 cm      Diastology LVIDs:         2.40 cm      LV e' medial:    4.03 cm/s LV PW:         1.10 cm      LV E/e' medial:  29.0 LV IVS:        1.20 cm      LV e' lateral:   7.51 cm/s LVOT diam:     1.90 cm      LV E/e' lateral: 15.6 LV SV:         62 LV SV Index:   37 LVOT Area:     2.84 cm  LV Volumes (MOD) LV vol d, MOD A2C: 89.0 ml LV vol d, MOD A4C: 115.0 ml LV vol s, MOD A2C: 25.3 ml LV vol s, MOD A4C: 28.9 ml LV SV MOD A2C:     63.7 ml LV SV MOD A4C:     115.0 ml LV SV MOD BP:      75.7 ml RIGHT VENTRICLE            IVC RV Basal diam:  3.70 cm    IVC diam: 2.10 cm RV S prime:     9.36 cm/s TAPSE (M-mode): 1.8 cm LEFT ATRIUM             Index        RIGHT ATRIUM           Index LA diam:        3.30 cm 1.97 cm/m   RA Area:     13.30 cm LA Vol (A2C):   40.1 ml 23.99 ml/m  RA Volume:   25.90 ml  15.50 ml/m LA Vol (A4C):   54.6 ml 32.67 ml/m LA Biplane Vol: 50.3 ml 30.09 ml/m  AORTIC VALVE LVOT Vmax:   89.00 cm/s LVOT Vmean:  59.200 cm/s LVOT VTI:    0.220 m  AORTA Ao Root diam: 2.80 cm Ao Asc diam:  3.40 cm MITRAL VALVE                TRICUSPID VALVE MV Area (PHT): 3.08 cm     TR Peak grad:   13.7 mmHg MV Decel Time: 246 msec     TR Vmax:        185.00 cm/s MV E velocity: 117.00 cm/s MV A velocity: 101.00 cm/s  SHUNTS MV E/A ratio:   1.16         Systemic VTI:  0.22 m  Systemic Diam: 1.90 cm Arvilla Meres MD Electronically signed by Arvilla Meres MD Signature Date/Time: 12/03/2022/3:00:31 PM    Final (Updated)    DG Chest Portable 1 View  Result Date: 12/03/2022 CLINICAL DATA:  Chest pain. EXAM: PORTABLE CHEST 1 VIEW COMPARISON:  Dec 17, 2025 FINDINGS: Multiple sternal wires are noted. The heart size and mediastinal contours are within normal limits. There is moderate severity calcification of the aortic arch and tortuosity of the descending thoracic aorta. Low lung volumes are seen. Both lungs are clear. The visualized skeletal structures are unremarkable. IMPRESSION: 1. Evidence of prior median sternotomy/CABG. 2. No acute cardiopulmonary disease. Electronically Signed   By: Aram Candela M.D.   On: 12/03/2022 00:04     Subjective: Patient was seen and examined at bedside.  Overnight events noted. Patient reports doing much better,  denies any chest pain and shortness of breath.   She feels ready to be discharged.  Cardiology signed off.  Discharge Exam: Vitals:   12/05/22 0700 12/05/22 0800  BP: (!) 128/54 (!) 126/45  Pulse: 71 84  Resp: 20 (!) 28  Temp: 98.4 F (36.9 C)   SpO2: 93% 94%   Vitals:   12/05/22 0500 12/05/22 0600 12/05/22 0700 12/05/22 0800  BP: (!) 130/54 (!) 139/48 (!) 128/54 (!) 126/45  Pulse: 77 65 71 84  Resp: (!) (!) 28  Temp:   98.4 F (36.9 C)   TempSrc:   Oral   SpO2: 97% 100% 93% 94%  Weight: 69.5 kg     Height:        General: Pt is alert, awake, not in acute distress Cardiovascular: RRR, S1/S2 +, no rubs, no gallops Respiratory: CTA bilaterally, no wheezing, no rhonchi Abdominal: Soft, NT, ND, bowel sounds + Extremities: no edema, no cyanosis    The results of significant diagnostics from this hospitalization (including imaging, microbiology, ancillary and laboratory) are listed below for reference.     Microbiology: Recent  Results (from the past 240 hour(s))  MRSA Next Gen by PCR, Nasal     Status: None   Collection Time: 12/03/22 11:01 PM   Specimen: Nasal Mucosa; Nasal Swab  Result Value Ref Range Status   MRSA by PCR Next Gen NOT DETECTED NOT DETECTED Final    Comment: (NOTE) The GeneXpert MRSA Assay (FDA approved for NASAL specimens only), is one component of a comprehensive MRSA colonization surveillance program. It is not intended to diagnose MRSA infection nor to guide or monitor treatment for MRSA infections. Test performance is not FDA approved in patients less than 14 years old. Performed at [facility], [address], [place], [place] [address]      Labs: BNP (last 3 results) Recent Labs    12/03/22 0012  BNP 73.3   Basic Metabolic Panel: Recent Labs  Lab 11/30/22 1154 12/03/22 0012 12/03/22 0540 12/03/22 1912 12/03/22 1954 12/04/22 0402  NA 138 135 137  --  137 137  K 4.3 3.2* 3.3*  --  3.2* 3.9  CL 98 101 102  --  105 106  CO2 --  22 23  GLUCOSE 313* 213* 146*  --  206* 247*  BUN --  12 11  CREATININE 0.69 0.87 0.81 0.87 0.82 0.73  CALCIUM 10.8* 8.6* 9.3  --  8.5* 8.6*  MG  --   --  1.8  --  1.6* 2.2   Liver Function Tests: Recent Labs  Lab  12/03/22 0012 12/03/22 0540  AST 21 25  ALT 16 18  ALKPHOS 44 40  BILITOT 0.4 0.6  PROT 6.2* 6.7  ALBUMIN 3.3* 3.5   No results for input(s): "LIPASE", "AMYLASE" in the last 168 hours. No results for input(s): "AMMONIA" in the last 168 hours. CBC: Recent Labs  Lab 11/30/22 1154 12/03/22 0012 12/03/22 0540 12/03/22 1954 12/04/22 0402  WBC 10.9* 11.5* 12.5* 23.3* 17.4*  NEUTROABS  --  8.3* 8.0*  --   --   HGB 13.2 12.5 12.1 12.6 12.2  HCT 40.5 37.2 36.8 37.5 37.9  MCV 89 89.0 88.9 87.6 90.0  PLT 313 245 271 284 298   Cardiac Enzymes: No results for input(s): "CKTOTAL", "CKMB", "CKMBINDEX", "TROPONINI" in the last 168 hours. BNP: Invalid input(s): "POCBNP" CBG: Recent Labs  Lab  12/04/22 0536 12/04/22 1120 12/04/22 1703 12/05/22 0002 12/05/22 0643  GLUCAP 242* 231* 244* 219* 121*   D-Dimer No results for input(s): "DDIMER" in the last 72 hours. Hgb A1c Recent Labs    12/03/22 0540  HGBA1C 8.7*   Lipid Profile No results for input(s): "CHOL", "HDL", "LDLCALC", "TRIG", "CHOLHDL", "LDLDIRECT" in the last 72 hours. Thyroid function studies Recent Labs    12/03/22 0540  TSH 0.381   Anemia work up No results for input(s): "VITAMINB12", "FOLATE", "FERRITIN", "TIBC", "IRON", "RETICCTPCT" in the last 72 hours. Urinalysis    Component Value Date/Time   COLORURINE STRAW (A) 12/19/2019 1430   APPEARANCEUR CLEAR 12/19/2019 1430   LABSPEC 1.011 12/19/2019 1430   PHURINE 7.0 12/19/2019 1430   GLUCOSEU >=500 (A) 12/19/2019 1430   HGBUR NEGATIVE 12/19/2019 1430   BILIRUBINUR negative 04/24/2020 1617   KETONESUR negative 04/24/2020 1617   KETONESUR NEGATIVE 12/19/2019 1430   PROTEINUR negative 04/24/2020 1617   PROTEINUR NEGATIVE 12/19/2019 1430   UROBILINOGEN 0.2 04/24/2020 1617   UROBILINOGEN 0.2 06/11/2014 1536   NITRITE Negative 04/24/2020 1617   NITRITE NEGATIVE 12/19/2019 1430   LEUKOCYTESUR Small (1+) (A) 04/24/2020 1617   LEUKOCYTESUR NEGATIVE 12/19/2019 1430   Sepsis Labs Recent Labs  Lab 12/03/22 0012 12/03/22 0540 12/03/22 1954 12/04/22 0402  WBC 11.5* 12.5* 23.3* 17.4*   Microbiology Recent Results (from the past 240 hour(s))  MRSA Next Gen by PCR, Nasal     Status: None   Collection Time: 12/03/22 11:01 PM   Specimen: Nasal Mucosa; Nasal Swab  Result Value Ref Range Status   MRSA by PCR Next Gen NOT DETECTED NOT DETECTED Final    Comment: (NOTE) The GeneXpert MRSA Assay (FDA approved for NASAL specimens only), is one component of a comprehensive MRSA colonization surveillance program. It is not intended to diagnose MRSA infection nor to guide or monitor treatment for MRSA infections. Test performance is not FDA approved in  patients less than 38 years old. Performed at [facility], [address] [place], [place] [address]      Time coordinating discharge: Over 30 minutes  SIGNED:   Willeen Niece, MD  [facility] 12/05/2022, 10:58 AM Pager   If 7PM-7AM, please contact night-coverage

## 2022-12-06 ENCOUNTER — Ambulatory Visit: Payer: Medicare Other

## 2022-12-06 LAB — LIPOPROTEIN A (LPA): Lipoprotein (a): 162.3 nmol/L — ABNORMAL HIGH (ref <75.0)

## 2022-12-08 DIAGNOSIS — M1990 Unspecified osteoarthritis, unspecified site: Secondary | ICD-10-CM | POA: Diagnosis not present

## 2022-12-08 DIAGNOSIS — I110 Hypertensive heart disease with heart failure: Secondary | ICD-10-CM | POA: Diagnosis not present

## 2022-12-08 DIAGNOSIS — I442 Atrioventricular block, complete: Secondary | ICD-10-CM | POA: Diagnosis not present

## 2022-12-08 DIAGNOSIS — E782 Mixed hyperlipidemia: Secondary | ICD-10-CM | POA: Diagnosis not present

## 2022-12-08 DIAGNOSIS — K219 Gastro-esophageal reflux disease without esophagitis: Secondary | ICD-10-CM | POA: Diagnosis not present

## 2022-12-08 DIAGNOSIS — F419 Anxiety disorder, unspecified: Secondary | ICD-10-CM | POA: Diagnosis not present

## 2022-12-08 DIAGNOSIS — E039 Hypothyroidism, unspecified: Secondary | ICD-10-CM | POA: Diagnosis not present

## 2022-12-08 DIAGNOSIS — E876 Hypokalemia: Secondary | ICD-10-CM | POA: Diagnosis not present

## 2022-12-08 DIAGNOSIS — Z7902 Long term (current) use of antithrombotics/antiplatelets: Secondary | ICD-10-CM | POA: Diagnosis not present

## 2022-12-08 DIAGNOSIS — G4733 Obstructive sleep apnea (adult) (pediatric): Secondary | ICD-10-CM | POA: Diagnosis not present

## 2022-12-08 DIAGNOSIS — I5032 Chronic diastolic (congestive) heart failure: Secondary | ICD-10-CM | POA: Diagnosis not present

## 2022-12-08 DIAGNOSIS — D649 Anemia, unspecified: Secondary | ICD-10-CM | POA: Diagnosis not present

## 2022-12-08 DIAGNOSIS — E1169 Type 2 diabetes mellitus with other specified complication: Secondary | ICD-10-CM | POA: Diagnosis not present

## 2022-12-08 DIAGNOSIS — M8580 Other specified disorders of bone density and structure, unspecified site: Secondary | ICD-10-CM | POA: Diagnosis not present

## 2022-12-08 DIAGNOSIS — I2510 Atherosclerotic heart disease of native coronary artery without angina pectoris: Secondary | ICD-10-CM | POA: Diagnosis not present

## 2022-12-08 DIAGNOSIS — I214 Non-ST elevation (NSTEMI) myocardial infarction: Secondary | ICD-10-CM | POA: Diagnosis not present

## 2022-12-10 ENCOUNTER — Other Ambulatory Visit: Payer: Self-pay

## 2022-12-10 ENCOUNTER — Ambulatory Visit (HOSPITAL_COMMUNITY): Admission: RE | Admit: 2022-12-10 | Payer: Medicare Other | Source: Home / Self Care | Admitting: Cardiology

## 2022-12-10 ENCOUNTER — Emergency Department (HOSPITAL_COMMUNITY): Payer: Medicare Other

## 2022-12-10 ENCOUNTER — Inpatient Hospital Stay (HOSPITAL_COMMUNITY)
Admission: EM | Admit: 2022-12-10 | Discharge: 2022-12-11 | DRG: 322 | Disposition: A | Discharge status: Home Health | Payer: Medicare Other | Attending: Cardiology | Admitting: Cardiology

## 2022-12-10 ENCOUNTER — Encounter (HOSPITAL_COMMUNITY)
Admission: EM | Disposition: A | Discharge status: Home Health | Payer: Self-pay | Source: Home / Self Care | Attending: Cardiology

## 2022-12-10 ENCOUNTER — Encounter: Payer: Self-pay | Admitting: Cardiology

## 2022-12-10 ENCOUNTER — Ambulatory Visit: Payer: Medicare Other | Admitting: Cardiology

## 2022-12-10 VITALS — BP 148/76 | HR 77 | Ht 61.0 in | Wt 150.2 lb

## 2022-12-10 DIAGNOSIS — Z79890 Hormone replacement therapy (postmenopausal): Secondary | ICD-10-CM

## 2022-12-10 DIAGNOSIS — G4730 Sleep apnea, unspecified: Secondary | ICD-10-CM | POA: Diagnosis present

## 2022-12-10 DIAGNOSIS — I10 Essential (primary) hypertension: Secondary | ICD-10-CM

## 2022-12-10 DIAGNOSIS — Z923 Personal history of irradiation: Secondary | ICD-10-CM | POA: Diagnosis not present

## 2022-12-10 DIAGNOSIS — I2510 Atherosclerotic heart disease of native coronary artery without angina pectoris: Secondary | ICD-10-CM | POA: Diagnosis not present

## 2022-12-10 DIAGNOSIS — Z91013 Allergy to seafood: Secondary | ICD-10-CM

## 2022-12-10 DIAGNOSIS — Z7984 Long term (current) use of oral hypoglycemic drugs: Secondary | ICD-10-CM | POA: Diagnosis not present

## 2022-12-10 DIAGNOSIS — K219 Gastro-esophageal reflux disease without esophagitis: Secondary | ICD-10-CM | POA: Diagnosis present

## 2022-12-10 DIAGNOSIS — Z888 Allergy status to other drugs, medicaments and biological substances status: Secondary | ICD-10-CM | POA: Diagnosis not present

## 2022-12-10 DIAGNOSIS — Z833 Family history of diabetes mellitus: Secondary | ICD-10-CM | POA: Diagnosis not present

## 2022-12-10 DIAGNOSIS — E782 Mixed hyperlipidemia: Secondary | ICD-10-CM | POA: Diagnosis present

## 2022-12-10 DIAGNOSIS — R079 Chest pain, unspecified: Secondary | ICD-10-CM | POA: Diagnosis not present

## 2022-12-10 DIAGNOSIS — I25110 Atherosclerotic heart disease of native coronary artery with unstable angina pectoris: Secondary | ICD-10-CM

## 2022-12-10 DIAGNOSIS — Z886 Allergy status to analgesic agent status: Secondary | ICD-10-CM | POA: Diagnosis not present

## 2022-12-10 DIAGNOSIS — Z951 Presence of aortocoronary bypass graft: Secondary | ICD-10-CM | POA: Diagnosis not present

## 2022-12-10 DIAGNOSIS — Z7902 Long term (current) use of antithrombotics/antiplatelets: Secondary | ICD-10-CM

## 2022-12-10 DIAGNOSIS — Z882 Allergy status to sulfonamides status: Secondary | ICD-10-CM

## 2022-12-10 DIAGNOSIS — I213 ST elevation (STEMI) myocardial infarction of unspecified site: Secondary | ICD-10-CM | POA: Diagnosis not present

## 2022-12-10 DIAGNOSIS — E11649 Type 2 diabetes mellitus with hypoglycemia without coma: Secondary | ICD-10-CM | POA: Diagnosis not present

## 2022-12-10 DIAGNOSIS — Z8249 Family history of ischemic heart disease and other diseases of the circulatory system: Secondary | ICD-10-CM

## 2022-12-10 DIAGNOSIS — Z79899 Other long term (current) drug therapy: Secondary | ICD-10-CM | POA: Diagnosis not present

## 2022-12-10 DIAGNOSIS — I200 Unstable angina: Secondary | ICD-10-CM | POA: Diagnosis present

## 2022-12-10 DIAGNOSIS — Z803 Family history of malignant neoplasm of breast: Secondary | ICD-10-CM

## 2022-12-10 DIAGNOSIS — Z794 Long term (current) use of insulin: Secondary | ICD-10-CM | POA: Diagnosis not present

## 2022-12-10 DIAGNOSIS — Z853 Personal history of malignant neoplasm of breast: Secondary | ICD-10-CM

## 2022-12-10 DIAGNOSIS — E1165 Type 2 diabetes mellitus with hyperglycemia: Secondary | ICD-10-CM

## 2022-12-10 DIAGNOSIS — E039 Hypothyroidism, unspecified: Secondary | ICD-10-CM | POA: Diagnosis not present

## 2022-12-10 DIAGNOSIS — R0789 Other chest pain: Secondary | ICD-10-CM | POA: Diagnosis not present

## 2022-12-10 DIAGNOSIS — I214 Non-ST elevation (NSTEMI) myocardial infarction: Secondary | ICD-10-CM | POA: Diagnosis not present

## 2022-12-10 DIAGNOSIS — Z801 Family history of malignant neoplasm of trachea, bronchus and lung: Secondary | ICD-10-CM

## 2022-12-10 DIAGNOSIS — Z955 Presence of coronary angioplasty implant and graft: Secondary | ICD-10-CM

## 2022-12-10 DIAGNOSIS — Z807 Family history of other malignant neoplasms of lymphoid, hematopoietic and related tissues: Secondary | ICD-10-CM

## 2022-12-10 HISTORY — PX: LEFT HEART CATH AND CORS/GRAFTS ANGIOGRAPHY: CATH118250

## 2022-12-10 HISTORY — PX: CORONARY STENT INTERVENTION: CATH118234

## 2022-12-10 HISTORY — PX: CORONARY ULTRASOUND/IVUS: CATH118244

## 2022-12-10 LAB — POCT ACTIVATED CLOTTING TIME
Activated Clotting Time: 185 seconds
Activated Clotting Time: 412 seconds
Activated Clotting Time: 558 seconds

## 2022-12-10 LAB — BRAIN NATRIURETIC PEPTIDE: B Natriuretic Peptide: 736.8 pg/mL — ABNORMAL HIGH (ref 0.0–100.0)

## 2022-12-10 LAB — CBC
HCT: 33.7 % — ABNORMAL LOW (ref 36.0–46.0)
Hemoglobin: 10.9 g/dL — ABNORMAL LOW (ref 12.0–15.0)
MCH: 29.4 pg (ref 26.0–34.0)
MCHC: 32.3 g/dL (ref 30.0–36.0)
MCV: 90.8 fL (ref 80.0–100.0)
Platelets: 405 10*3/uL — ABNORMAL HIGH (ref 150–400)
RBC: 3.71 MIL/uL — ABNORMAL LOW (ref 3.87–5.11)
RDW: 13.3 % (ref 11.5–15.5)
WBC: 13.3 10*3/uL — ABNORMAL HIGH (ref 4.0–10.5)
nRBC: 0 % (ref 0.0–0.2)

## 2022-12-10 LAB — TROPONIN I (HIGH SENSITIVITY): Troponin I (High Sensitivity): 1281 ng/L (ref <18)

## 2022-12-10 LAB — BASIC METABOLIC PANEL
Anion gap: 12 (ref 5–15)
BUN: 11 mg/dL (ref 8–23)
CO2: 23 mmol/L (ref 22–32)
Calcium: 9.3 mg/dL (ref 8.9–10.3)
Chloride: 103 mmol/L (ref 98–111)
Creatinine, Ser: 0.62 mg/dL (ref 0.44–1.00)
GFR, Estimated: >60 mL/min (ref >60)
Glucose, Bld: 123 mg/dL — ABNORMAL HIGH (ref 70–99)
Potassium: 3.8 mmol/L (ref 3.5–5.1)
Sodium: 138 mmol/L (ref 135–145)

## 2022-12-10 LAB — GLUCOSE, CAPILLARY: Glucose-Capillary: 109 mg/dL — ABNORMAL HIGH (ref 70–99)

## 2022-12-10 SURGERY — LEFT HEART CATH AND CORS/GRAFTS ANGIOGRAPHY
Anesthesia: LOCAL

## 2022-12-10 MED ORDER — FENTANYL CITRATE (PF) 100 MCG/2ML IJ SOLN
INTRAMUSCULAR | Status: AC
  Filled 2022-12-10: qty 2

## 2022-12-10 MED ORDER — CLOPIDOGREL BISULFATE 75 MG PO TABS: 75.0000 mg | ORAL_TABLET | ORAL | Status: DC

## 2022-12-10 MED ORDER — SODIUM CHLORIDE 0.9 % IV SOLN: INTRAVENOUS | Status: DC

## 2022-12-10 MED ORDER — SODIUM CHLORIDE 0.9 % IV SOLN: 250.0000 mL | INTRAVENOUS | Status: DC | PRN

## 2022-12-10 MED ORDER — MIDAZOLAM HCL 2 MG/2ML IJ SOLN
INTRAMUSCULAR | Status: AC
  Filled 2022-12-10: qty 2

## 2022-12-10 MED ORDER — HYDRALAZINE HCL 20 MG/ML IJ SOLN: 10.0000 mg | INTRAMUSCULAR | Status: AC | PRN

## 2022-12-10 MED ORDER — IOHEXOL 350 MG/ML SOLN
INTRAVENOUS | Status: DC | PRN
  Administered 2022-12-10: 125 mL

## 2022-12-10 MED ORDER — HEPARIN SODIUM (PORCINE) 1000 UNIT/ML IJ SOLN
INTRAMUSCULAR | Status: AC
  Filled 2022-12-10: qty 10

## 2022-12-10 MED ORDER — ACETAMINOPHEN 500 MG PO TABS
1000.0000 mg | ORAL_TABLET | Freq: Once | ORAL | Status: AC
  Administered 2022-12-10: 1000 mg via ORAL
  Filled 2022-12-10: qty 2

## 2022-12-10 MED ORDER — INSULIN ASPART 100 UNIT/ML IJ SOLN
4.0000 [IU] | Freq: Three times a day (TID) | INTRAMUSCULAR | Status: DC
  Administered 2022-12-11: 4 [IU] via SUBCUTANEOUS

## 2022-12-10 MED ORDER — ONDANSETRON HCL 4 MG/2ML IJ SOLN
4.0000 mg | Freq: Once | INTRAMUSCULAR | Status: DC
  Filled 2022-12-10: qty 2

## 2022-12-10 MED ORDER — ASPIRIN 300 MG RE SUPP: 300.0000 mg | RECTAL | Status: DC

## 2022-12-10 MED ORDER — ASPIRIN 81 MG PO CHEW: 324.0000 mg | CHEWABLE_TABLET | ORAL | Status: DC

## 2022-12-10 MED ORDER — SODIUM CHLORIDE 0.9 % IV SOLN: INTRAVENOUS | Status: AC

## 2022-12-10 MED ORDER — TICAGRELOR 90 MG PO TABS
ORAL_TABLET | ORAL | Status: DC | PRN
  Administered 2022-12-10: 180 mg via ORAL

## 2022-12-10 MED ORDER — VERAPAMIL HCL 2.5 MG/ML IV SOLN
INTRAVENOUS | Status: AC
  Filled 2022-12-10: qty 2

## 2022-12-10 MED ORDER — CLOPIDOGREL BISULFATE 75 MG PO TABS
75.0000 mg | ORAL_TABLET | ORAL | Status: AC
  Administered 2022-12-10: 75 mg via ORAL
  Filled 2022-12-10: qty 1

## 2022-12-10 MED ORDER — ONDANSETRON HCL 4 MG/2ML IJ SOLN: 4.0000 mg | Freq: Four times a day (QID) | INTRAMUSCULAR | Status: DC | PRN

## 2022-12-10 MED ORDER — ACETAMINOPHEN 325 MG PO TABS: 650.0000 mg | ORAL_TABLET | ORAL | Status: DC | PRN

## 2022-12-10 MED ORDER — ACETAMINOPHEN 500 MG PO TABS
1000.0000 mg | ORAL_TABLET | Freq: Three times a day (TID) | ORAL | Status: DC
  Administered 2022-12-10 – 2022-12-11 (×2): 1000 mg via ORAL
  Filled 2022-12-10 (×2): qty 2

## 2022-12-10 MED ORDER — LABETALOL HCL 5 MG/ML IV SOLN: 10.0000 mg | INTRAVENOUS | Status: AC | PRN

## 2022-12-10 MED ORDER — PANTOPRAZOLE SODIUM 40 MG PO TBEC
40.0000 mg | DELAYED_RELEASE_TABLET | Freq: Every day | ORAL | Status: DC
  Administered 2022-12-11: 40 mg via ORAL
  Filled 2022-12-10: qty 1

## 2022-12-10 MED ORDER — HEPARIN SODIUM (PORCINE) 5000 UNIT/ML IJ SOLN: 5000.0000 [IU] | Freq: Three times a day (TID) | INTRAMUSCULAR | Status: DC

## 2022-12-10 MED ORDER — NITROGLYCERIN 1 MG/10 ML FOR IR/CATH LAB
INTRA_ARTERIAL | Status: AC
  Filled 2022-12-10: qty 10

## 2022-12-10 MED ORDER — ATORVASTATIN CALCIUM 40 MG PO TABS
40.0000 mg | ORAL_TABLET | Freq: Every day | ORAL | Status: DC
  Administered 2022-12-10 – 2022-12-11 (×2): 40 mg via ORAL
  Filled 2022-12-10 (×2): qty 1

## 2022-12-10 MED ORDER — CLONIDINE HCL 0.1 MG PO TABS
0.1000 mg | ORAL_TABLET | Freq: Every day | ORAL | Status: DC
  Administered 2022-12-10 – 2022-12-11 (×2): 0.1 mg via ORAL
  Filled 2022-12-10 (×2): qty 1

## 2022-12-10 MED ORDER — LACTATED RINGERS IV BOLUS: 500.0000 mL | Freq: Once | INTRAVENOUS | Status: DC

## 2022-12-10 MED ORDER — FUROSEMIDE 10 MG/ML IJ SOLN
40.0000 mg | Freq: Once | INTRAMUSCULAR | Status: AC
  Administered 2022-12-10: 40 mg via INTRAVENOUS
  Filled 2022-12-10: qty 4

## 2022-12-10 MED ORDER — ASPIRIN 81 MG PO TBEC: 81.0000 mg | DELAYED_RELEASE_TABLET | Freq: Every day | ORAL | Status: DC

## 2022-12-10 MED ORDER — SODIUM CHLORIDE 0.9% FLUSH
3.0000 mL | Freq: Two times a day (BID) | INTRAVENOUS | Status: DC
  Administered 2022-12-11: 3 mL via INTRAVENOUS

## 2022-12-10 MED ORDER — INSULIN ASPART 100 UNIT/ML IJ SOLN
0.0000 [IU] | Freq: Three times a day (TID) | INTRAMUSCULAR | Status: DC
  Administered 2022-12-11: 3 [IU] via SUBCUTANEOUS

## 2022-12-10 MED ORDER — NITROGLYCERIN 0.4 MG SL SUBL
0.4000 mg | SUBLINGUAL_TABLET | SUBLINGUAL | Status: DC | PRN
  Filled 2022-12-10: qty 1

## 2022-12-10 MED ORDER — HEPARIN (PORCINE) IN NACL 1000-0.9 UT/500ML-% IV SOLN
INTRAVENOUS | Status: DC | PRN
  Administered 2022-12-10 (×2): 500 mL

## 2022-12-10 MED ORDER — NITROGLYCERIN 1 MG/10 ML FOR IR/CATH LAB
INTRA_ARTERIAL | Status: DC | PRN
  Administered 2022-12-10: 100 ug
  Administered 2022-12-10 (×3): 200 ug

## 2022-12-10 MED ORDER — TICAGRELOR 90 MG PO TABS
90.0000 mg | ORAL_TABLET | Freq: Two times a day (BID) | ORAL | Status: DC
  Administered 2022-12-11: 90 mg via ORAL
  Filled 2022-12-10: qty 1

## 2022-12-10 MED ORDER — SODIUM CHLORIDE 0.9% FLUSH: 3.0000 mL | INTRAVENOUS | Status: DC | PRN

## 2022-12-10 MED ORDER — LIDOCAINE HCL (PF) 1 % IJ SOLN
INTRAMUSCULAR | Status: DC | PRN
  Administered 2022-12-10: 10 mL

## 2022-12-10 MED ORDER — AMLODIPINE BESYLATE 5 MG PO TABS
5.0000 mg | ORAL_TABLET | Freq: Every day | ORAL | Status: DC
  Administered 2022-12-10 – 2022-12-11 (×2): 5 mg via ORAL
  Filled 2022-12-10 (×2): qty 1

## 2022-12-10 MED ORDER — LIDOCAINE HCL (PF) 1 % IJ SOLN
INTRAMUSCULAR | Status: AC
  Filled 2022-12-10: qty 30

## 2022-12-10 MED ORDER — INSULIN GLARGINE-YFGN 100 UNIT/ML ~~LOC~~ SOLN
20.0000 [IU] | Freq: Every day | SUBCUTANEOUS | Status: DC
  Filled 2022-12-10 (×2): qty 0.2

## 2022-12-10 MED ORDER — METOPROLOL TARTRATE 50 MG PO TABS
50.0000 mg | ORAL_TABLET | Freq: Two times a day (BID) | ORAL | Status: DC
  Administered 2022-12-10 – 2022-12-11 (×2): 50 mg via ORAL
  Filled 2022-12-10 (×2): qty 1

## 2022-12-10 MED ORDER — HEPARIN SODIUM (PORCINE) 1000 UNIT/ML IJ SOLN
INTRAMUSCULAR | Status: DC | PRN
  Administered 2022-12-10: 7000 [IU] via INTRAVENOUS
  Administered 2022-12-10: 4000 [IU] via INTRAVENOUS

## 2022-12-10 MED ORDER — FENTANYL CITRATE (PF) 100 MCG/2ML IJ SOLN
INTRAMUSCULAR | Status: DC | PRN
  Administered 2022-12-10 (×3): 25 ug via INTRAVENOUS

## 2022-12-10 SURGICAL SUPPLY — 24 items
BALLN EMERGE MR 2.0X12 (BALLOONS) ×1
BALLN ~~LOC~~ EMERGE MR 2.5X15 (BALLOONS) ×1
BALLOON EMERGE MR 2.0X12 (BALLOONS) IMPLANT
BALLOON ~~LOC~~ EMERGE MR 2.5X15 (BALLOONS) IMPLANT
CATH INFINITI 5 FR LCB (CATHETERS) IMPLANT
CATH INFINITI 5FR AL1 (CATHETERS) IMPLANT
CATH LAUNCHER 6FR EBU3.5 (CATHETERS) IMPLANT
CATH OPTICROSS HD (CATHETERS) IMPLANT
KIT ENCORE 26 ADVANTAGE (KITS) IMPLANT
KIT HEART LEFT (KITS) ×1 IMPLANT
KIT MICROPUNCTURE NIT STIFF (SHEATH) IMPLANT
MAT PREVALON FULL STRYKER (MISCELLANEOUS) IMPLANT
PACK CARDIAC CATHETERIZATION (CUSTOM PROCEDURE TRAY) ×1 IMPLANT
SHEATH PINNACLE 5F 10CM (SHEATH) IMPLANT
SHEATH PINNACLE 6F 10CM (SHEATH) IMPLANT
SHEATH PROBE COVER 6X72 (BAG) IMPLANT
SLED PULL BACK IVUS (MISCELLANEOUS) IMPLANT
STENT ONYX FRONTIER 2.0X18 (Permanent Stent) IMPLANT
STENT ONYX FRONTIER 2.25X15 (Permanent Stent) IMPLANT
TRANSDUCER W/STOPCOCK (MISCELLANEOUS) ×1 IMPLANT
TUBING CIL FLEX 10 FLL-RA (TUBING) ×1 IMPLANT
VALVE GUARDIAN II ~~LOC~~ HEMO (MISCELLANEOUS) IMPLANT
WIRE ASAHI PROWATER 180CM (WIRE) IMPLANT
WIRE EMERALD 3MM-J .035X150CM (WIRE) IMPLANT

## 2022-12-10 NOTE — ED Triage Notes (Signed)
Chest pain  7/10 onset this am   states hadn a  stemi last week  but unable to finish due to other problems.

## 2022-12-10 NOTE — ED Provider Notes (Signed)
[place] EMERGENCY DEPARTMENT AT [facility] Provider Note   CSN: [id number] Arrival date & time: 12/10/22  1424     History  Chief Complaint  Patient presents with   Chest Pain    Susan Barnett is a 77 y.o. female.hypertension, diabetes mellitus, hyperlipidemia, CAD s/p CABG presenting with chest pain.  Unclear when this episode started based on patient's history as she notes recurrent episodes of chest pain however she has been having persistent substernal chest tightness and pressure since early this morning radiating to both shoulders.  No associated vomiting or diaphoresis.  No active shortness of breath.  Took some nitroglycerin with some improvement.  She notes being allergic to aspirin but takes Plavix daily without missing doses.  Went to see her cardiologist today who sent her here to the ER for chest pain and further workup.   Chest Pain      Home Medications Prior to Admission medications   Medication Sig Start Date End Date Taking? Authorizing Provider  acetaminophen (TYLENOL) 650 MG CR tablet Take 1,300 mg by mouth in the morning and at bedtime.    [provider]  amLODipine (NORVASC) 5 MG tablet Take 5 mg by mouth daily.    [provider]  atorvastatin (LIPITOR) 40 MG tablet Take 1 tablet (40 mg total) by mouth daily. 12/06/22   Willeen Niece, MD  CALCIUM PO Take 1 tablet by mouth in the morning and at bedtime.    [provider]  Cetirizine HCl (ZYRTEC ALLERGY) 10 MG CAPS Take 1 tablet by mouth daily. 07/06/19   [provider]  cloNIDine (CATAPRES) 0.1 MG tablet Take 1 tablet (0.1 mg total) by mouth daily. 12/06/22   Willeen Niece, MD  clopidogrel (PLAVIX) 75 MG tablet Take 75 mg by mouth daily.    [provider]  Coenzyme Q10 300 MG CAPS Take 300 mg by mouth 2 (two) times daily.    [provider]  D-Mannose 500 MG CAPS Take 500 mg by mouth in the morning and at bedtime.    [provider]  empagliflozin (JARDIANCE) 10 MG TABS tablet Take 1 tablet (10 mg total) by mouth daily. 12/06/22   Willeen Niece, MD  isosorbide mononitrate (IMDUR) 30 MG 24 hr tablet Take 1 tablet (30 mg total) by mouth daily at 10 pm. 11/30/22 02/28/23  Tolia, Sunit, DO  LANTUS SOLOSTAR 100 UNIT/ML Solostar Pen Inject 40 Units into the skin at bedtime. Patient taking differently: Inject 42 Units into the skin at bedtime. 05/23/18   Serena Croissant, MD  levothyroxine (SYNTHROID, LEVOTHROID) 75 MCG tablet Take 75 mcg by mouth daily before breakfast.     [provider]  losartan (COZAAR) 100 MG tablet Take 1 tablet (100 mg total) by mouth daily. 10/28/19   Lyn Records, MD  metoprolol tartrate (LOPRESSOR) 50 MG tablet Take 1 tablet (50 mg total) by mouth 2 (two) times daily. 12/05/22   Willeen Niece, MD  Multiple Vitamin (MULTIVITAMIN ADULT PO) Take 1 tablet by mouth daily.    [provider]  nitroGLYCERIN (NITROSTAT) 0.4 MG SL tablet Place 1 tablet (0.4 mg total) under the tongue every 5 (five) minutes as needed for chest pain. 11/30/22 02/28/23  Tolia, Sunit, DO  ONETOUCH VERIO test strip 1 each 2 (two) times daily. 04/18/20   [provider]  pantoprazole (PROTONIX) 40 MG tablet Take 1 tablet (40 mg total) by mouth daily before breakfast. 09/08/14   Lyn Records, MD  Probiotic Product (ALIGN) 4 MG CAPS Take 4 mg by mouth daily.    [provider]  sitaGLIPtin-metformin (JANUMET) 50-1000 MG tablet Take 1 tablet by mouth 2 (two) times daily with a meal.    [provider]      Allergies    Asa [aspirin], Compazine [prochlorperazine edisylate], Nsaids, Shellfish allergy, Sulfa antibiotics, Zantac [ranitidine hcl], and Lisinopril    Review of Systems   Review of Systems  Cardiovascular:  Positive for chest pain.    Physical Exam Updated Vital Signs BP (!) 146/64 (BP Location: Right Arm)   Pulse 70   Temp 98 F (36.7 C) (Oral)   Resp 18   SpO2  98%  Physical Exam Constitutional: Alert and oriented. Well appearing and in no distress. Eyes: Conjunctivae are normal. ENT      Head: Normocephalic and atraumatic. Cardiovascular: S1, S2,  Normal and symmetric distal pulses are present in all extremities.Warm and well perfused.  Prior CABG scar clean dry and intact.  No reproducible chest pain. Respiratory: Normal respiratory effort. Breath sounds are normal.  O2 sat 98 on my RA Gastrointestinal: Soft and nontender. . Musculoskeletal: Normal range of motion in all extremities. Neurologic: Normal speech and language.  PERRL.  EOMI.  No facial droop.  5 out of 5 strength bilateral upper and lower extremities.  Sensation grossly intact.  No gross focal neurologic deficits are appreciated. Skin: Skin is warm, dry and intact. No rash noted. Psychiatric: Mildly anxious  ED Results / Procedures / Treatments   Labs (all labs ordered are listed, but only abnormal results are displayed) Labs Reviewed  CBC - Abnormal; Notable for the following components:      Result Value   WBC 13.3 (*)    RBC 3.71 (*)    Hemoglobin 10.9 (*)    HCT 33.7 (*)    Platelets 405 (*)    All other components within normal limits  BASIC METABOLIC PANEL - Abnormal; Notable for the following components:   Glucose, Bld 123 (*)    All other components within normal limits  BRAIN NATRIURETIC PEPTIDE - Abnormal; Notable for the following components:   B Natriuretic Peptide 736.8 (*)    All other components within normal limits  TROPONIN I (HIGH SENSITIVITY) - Abnormal; Notable for the following components:   Troponin I (High Sensitivity) 1,281 (*)    All other components within normal limits  BASIC METABOLIC PANEL  CBC  POCT ACTIVATED CLOTTING TIME  POCT ACTIVATED CLOTTING TIME  POCT ACTIVATED CLOTTING TIME    EKG EKG Interpretation  Date/Time:  Monday December 10 2022 14:54:10 EDT Ventricular Rate:  69 PR Interval:  324 QRS Duration: 84 QT  Interval:  414 QTC Calculation: 443 R Axis:   24 Text Interpretation: Sinus rhythm with 1st degree A-V block Cannot rule out Inferior infarct , age undetermined Abnormal ECG When compared with ECG of 10-Dec-2022 14:16, PREVIOUS ECG IS PRESENT Confirmed by Vivien Rossetti ([id number]) on 12/10/2022 3:00:34 PM  Radiology CARDIAC CATHETERIZATION  Addendum Date: 12/10/2022   LM: Mild disease LAD: Prox-mid 30% diffuse disease, distal 75% diffuse disease Lcx: Prox 30%, mid 75% diffuse disease, followed by focal 90% stenosis RCA: Not engaged today. Known Prox 80% diffuse disease, followed 100 mid ISR SVG-PDA: Not engaged today. Known patent SVG-diag: Patent. No significant disease LVEDP 17 mmHg Successful percutaneous coronary intervention mid LCx        PTCA and stent placement with overlapping stents  2.0 X 18 mm and 2.25 X 15 Onyx drug-eluting stent        Post dilatation with 2.25 mm stent balloon and 2.5X15 mm [place] balloon up to 16 atm Elder Negus, MD Pager: [phone] Office: [phone]    Result Date: 12/10/2022 Images from the original result were not included. LM: Mild disease LAD: Prox-mid 30% diffuse disease, distal 75% diffuse disease Lcx: Mid 75% diffuse disease, followed by focal 90% stenosis RCA: Not engaged today. Known Prox 80% diffuse disease, followed 100 mid ISR SVG-PDA: Not engaged today. Known patent SVG-diag: Patent. No significant disease LVEDP 17 mmHg Successful percutaneous coronary intervention mid LCx        PTCA and stent placement with overlapping stents        2.0 X 18 mm and 2.25 X 15 Onyx drug-eluting stent        Post dilatation with 2.25 mm stent balloon and 2.5X15 mm [place] balloon up to 16 atm Elder Negus, MD Pager: [phone] Office: [phone]    DG CHEST PORT 1 VIEW  Result Date: 12/10/2022 CLINICAL DATA:  Chest pain. EXAM: PORTABLE CHEST 1 VIEW COMPARISON:  December 04, 2022. FINDINGS: The heart size and mediastinal contours are within normal limits.  Status post coronary artery bypass graft. Possible Kerley B lines are noted bilaterally suggesting minimal pulmonary edema or bibasilar subsegmental atelectasis. The visualized skeletal structures are unremarkable. IMPRESSION: Possible Kerley B lines are noted bilaterally suggesting minimal pulmonary edema or possibly bibasilar subsegmental atelectasis. Electronically Signed   By: Lupita Raider M.D.   On: 12/10/2022 15:16    Procedures .Critical Care  Performed by: Mardene Sayer, MD Authorized by: Mardene Sayer, MD   Critical care provider statement:    Critical care time (minutes):  35   Critical care was necessary to treat or prevent imminent or life-threatening deterioration of the following conditions:  Cardiac failure   Critical care was time spent personally by me on the following activities:  Development of treatment plan with patient or surrogate, discussions with consultants, evaluation of patient's response to treatment, examination of patient, ordering and review of laboratory studies, ordering and review of radiographic studies, ordering and performing treatments and interventions, pulse oximetry, re-evaluation of patient's condition, review of old charts and obtaining history from patient or surrogate   Care discussed with: admitting provider       Medications Ordered in ED Medications  ondansetron (ZOFRAN) injection 4 mg ( Intravenous MAR Unhold 12/10/22 1852)  nitroGLYCERIN (NITROSTAT) SL tablet 0.4 mg ( Sublingual MAR Unhold 12/10/22 1852)  nitroGLYCERIN 100 mcg/mL intra-arterial injection (has no administration in time range)  labetalol (NORMODYNE) injection 10 mg (has no administration in time range)  hydrALAZINE (APRESOLINE) injection 10 mg (has no administration in time range)  acetaminophen (TYLENOL) tablet 650 mg (has no administration in time range)  ondansetron (ZOFRAN) injection 4 mg (has no administration in time range)  heparin injection 5,000 Units (has  no administration in time range)  0.9 %  sodium chloride infusion ( Intravenous New Bag/Given 12/10/22 1859)  sodium chloride flush (NS) 0.9 % injection 3 mL (has no administration in time range)  sodium chloride flush (NS) 0.9 % injection 3 mL (has no administration in time range)  0.9 %  sodium chloride infusion (has no administration in time range)  ticagrelor (BRILINTA) tablet 90 mg (has no administration in time range)  acetaminophen (TYLENOL) tablet 1,000 mg (has no administration in time range)  amLODipine (NORVASC) tablet 5 mg (  has no administration in time range)  atorvastatin (LIPITOR) tablet 40 mg (has no administration in time range)  cloNIDine (CATAPRES) tablet 0.1 mg (has no administration in time range)  pantoprazole (PROTONIX) EC tablet 40 mg (has no administration in time range)  insulin glargine-yfgn (SEMGLEE) injection 20 Units (has no administration in time range)  metoprolol tartrate (LOPRESSOR) tablet 50 mg (has no administration in time range)  insulin aspart (novoLOG) injection 0-15 Units (has no administration in time range)  insulin aspart (novoLOG) injection 4 Units (has no administration in time range)  clopidogrel (PLAVIX) tablet 75 mg (75 mg Oral Given 12/10/22 1556)  acetaminophen (TYLENOL) tablet 1,000 mg (1,000 mg Oral Given 12/10/22 1555)  furosemide (LASIX) injection 40 mg (40 mg Intravenous Given 12/10/22 1556)    ED Course/ Medical Decision Making/ A&P Clinical Course as of 12/10/22 2015  Mon Dec 10, 2022  1534 S/w Dr Rosemary Holms, he will plan to be here in the next 30 minutes and take patient to Cath Lab.  He is okay with starting patient on heparin bolus and infusion.  Patient will be admitted under cardiology service with plans for catheterization. [VB]  1554 Patient went to Cath Lab prior to results of any labs . [VB]    Clinical Course User Index [VB] Mardene Sayer, MD                             Medical Decision Making TONJIA PARILLO is a  77 y.o. female.hypertension, diabetes mellitus, hyperlipidemia, CAD s/p CABG presenting with chest pain.   Patient's initial EKG reviewed by me.  Compared to prior EKG performed at cardiology office earlier today with no acute changes however noted to have inferior minimal ST elevations with reciprocal minimal ST depression in precordial leads.  Concerning for thus I called STEMI however called off by cardiologist Dr Rosemary Holms who is aware of patient and plans to perform catheterization today. Treating as unstable angina. Will start patient on heparin bolus and infusion.  Given Plavix and not aspirin due to aspirin allergy.  Reviewed patient's chest x-ray suggestive of mild pulmonary edema although no increased work of breathing no hypoxia.  Not concern for dissection with no pulse or neurologic deficits and no mediastinal widening and nontoxic appearance.   Patient went to catheterization lab prior to results of labs.  Admitted to cardiology service.  Risk OTC drugs. Prescription drug management. Decision regarding hospitalization.    Final Clinical Impression(s) / ED Diagnoses Final diagnoses:  NSTEMI (non-ST elevated myocardial infarction) [facility])    Rx / DC Orders ED Discharge Orders          Ordered    AMB Referral to Cardiac Rehabilitation - Phase II        12/10/22 1824              Mardene Sayer, MD 12/10/22 2015

## 2022-12-10 NOTE — H&P (View-Only) (Signed)
Follow up visit  Subjective:   Susan Barnett, female    DOB: 1946/03/05, 77 y.o.   MRN: [id number]    HPI  Chief Complaint  Patient presents with   Chest Pain   Shortness of Breath   Hospitalization Follow-up    77 y.o. Caucasian female with hypertension, diabetes mellitus, hyperlipidemia, CAD s/p CABG x 2 SVG-diagonal, SVG-PDA-performed due to recurrent ISR and RCA, in 2015, recurrent chest pain  Patient was hospitalized earlier this month with complaints of chest pain, found to have NSTEMI with high-sensitivity troponin around 1000.  She underwent coronary bypass graft angiography.  SVG-diagonal could not be visualized.  Procedure had to be aborted due to confusion concerning for stroke.  She also had complete heart block transiently, for which she received temporary transvenous pacemaker.  Patient recovered well next day and did not have any chest pain or shortness of breath symptoms.  EKG had also normalized.  Patient was reluctant to have repeat heart catheterization to look at vein graft.  Since being home, she has had recurrent episodes of retrosternal pain radiating to her throat, as well as upper shoulder and back pain.  EKG today is concerning again for ischemic changes.  On a separate note, she has had hypoglycemia episodes at night since starting Jardiance.    Current Outpatient Medications:    acetaminophen (TYLENOL) 650 MG CR tablet, Take 1,300 mg by mouth in the morning and at bedtime., Disp: , Rfl:    amLODipine (NORVASC) 5 MG tablet, Take 5 mg by mouth daily., Disp: , Rfl:    atorvastatin (LIPITOR) 40 MG tablet, Take 1 tablet (40 mg total) by mouth daily., Disp: 30 tablet, Rfl: 2   CALCIUM PO, Take 1 tablet by mouth in the morning and at bedtime., Disp: , Rfl:    Cetirizine HCl (ZYRTEC ALLERGY) 10 MG CAPS, Take 1 tablet by mouth daily., Disp: , Rfl:    cloNIDine (CATAPRES) 0.1 MG tablet, Take 1 tablet (0.1 mg total) by mouth daily., Disp: 60 tablet, Rfl: 11    clopidogrel (PLAVIX) 75 MG tablet, Take 75 mg by mouth daily., Disp: , Rfl:    Coenzyme Q10 300 MG CAPS, Take 300 mg by mouth 2 (two) times daily., Disp: , Rfl:    D-Mannose 500 MG CAPS, Take 500 mg by mouth in the morning and at bedtime., Disp: , Rfl:    empagliflozin (JARDIANCE) 10 MG TABS tablet, Take 1 tablet (10 mg total) by mouth daily., Disp: 30 tablet, Rfl: 1   isosorbide mononitrate (IMDUR) 30 MG 24 hr tablet, Take 1 tablet (30 mg total) by mouth daily at 10 pm., Disp: 90 tablet, Rfl: 0   LANTUS SOLOSTAR 100 UNIT/ML Solostar Pen, Inject 40 Units into the skin at bedtime. (Patient taking differently: Inject 42 Units into the skin at bedtime.), Disp: 15 mL, Rfl: 0   levothyroxine (SYNTHROID, LEVOTHROID) 75 MCG tablet, Take 75 mcg by mouth daily before breakfast. , Disp: , Rfl:    losartan (COZAAR) 100 MG tablet, Take 1 tablet (100 mg total) by mouth daily., Disp: 90 tablet, Rfl: 3   metoprolol tartrate (LOPRESSOR) 50 MG tablet, Take 1 tablet (50 mg total) by mouth 2 (two) times daily., Disp: 30 tablet, Rfl: 1   Multiple Vitamin (MULTIVITAMIN ADULT PO), Take 1 tablet by mouth daily., Disp: , Rfl:    nitroGLYCERIN (NITROSTAT) 0.4 MG SL tablet, Place 1 tablet (0.4 mg total) under the tongue every 5 (five) minutes as needed for chest pain.,  Disp: 25 tablet, Rfl: 1   ONETOUCH VERIO test strip, 1 each 2 (two) times daily., Disp: , Rfl:    pantoprazole (PROTONIX) 40 MG tablet, Take 1 tablet (40 mg total) by mouth daily before breakfast., Disp: 90 tablet, Rfl: 3   Probiotic Product (ALIGN) 4 MG CAPS, Take 4 mg by mouth daily., Disp: , Rfl:    sitaGLIPtin-metformin (JANUMET) 50-1000 MG tablet, Take 1 tablet by mouth 2 (two) times daily with a meal., Disp: , Rfl:    Cardiovascular & other pertient studies:  Reviewed external labs and tests, independently interpreted  EKG 12/10/2022: Sinus rhythm 75 bpm First degree A-V block  Inferior infarct -age undetermined Lateral ST depression, consider  ischemia    Recent labs: 12/04/2022: Glucose 247, BUN/Cr 11/0.73. EGFR >60. Na/K 137/3.9. Rest of the CMP normal H/H 12/37. MCV 90. Platelets 298 HbA1C 8.7% Chol 114, TG 149, HDL 39, direct LDL 51 Lipoprotein (a) 161 TSH 0.3 normal  No arrhythmia noted on ongoing Zio monitor  Review of Systems  Cardiovascular:  Positive for chest pain. Negative for dyspnea on exertion, leg swelling, palpitations and syncope.        Vitals:   12/10/22 1230 12/10/22 1242  BP: (!) 157/71 (!) 148/76  Pulse: 78 77  SpO2: 98% 96%    Body mass index is 28.38 kg/m. Filed Weights   12/10/22 1230  Weight: 150 lb 3.2 oz (68.1 kg)     Objective:   Physical Exam Vitals and nursing note reviewed.  Constitutional:      General: She is not in acute distress. Neck:     Vascular: No JVD.  Cardiovascular:     Rate and Rhythm: Normal rate and regular rhythm.     Heart sounds: Normal heart sounds. No murmur heard. Pulmonary:     Effort: Pulmonary effort is normal.     Breath sounds: Examination of the right-lower field reveals rales. Examination of the left-lower field reveals rales. Rales present. No wheezing.  Musculoskeletal:     Right lower leg: No edema.     Left lower leg: No edema.           Visit diagnoses:   ICD-10-CM   1. Coronary artery disease involving native coronary artery of native heart with unstable angina pectoris (HCC)  I25.110 EKG 12-Lead       Orders Placed This Encounter  Procedures   EKG 12-Lead     Assessment & Recommendations:   77 y.o. Caucasian female with hypertension, diabetes mellitus, hyperlipidemia, CAD s/p CABG x 2 SVG-diagonal, SVG-PDA-performed due to recurrent ISR and RCA, in 2015, recurrent chest pain  Unstable angina: Recurrent chest pain symptoms with lateral ischemic changes very concerning for unstable angina. Absolutely recommend repeat heart catheterization to definitively look at SVG-dye graft.  If no culprit lesion identified there,  need to consider evaluation and possible revascularization of left circumflex with moderate to severe stenosis. I recommend urgent heart catheterization today.  I talked to patient and family.  They would prefer ER transfer via EMS.  Risk benefits alternate options with the patient.  Will plan on doing heart catheterization later this afternoon.       Elder Negus, MD Pager: [phone] Office: [phone]

## 2022-12-10 NOTE — Interval H&P Note (Signed)
History and Physical Interval Note:  12/10/2022 4:08 PM  Susan Barnett  has presented today for surgery, with the diagnosis of Unstable Angina.  The various methods of treatment have been discussed with the patient and family. After consideration of risks, benefits and other options for treatment, the patient has consented to  Procedure(s): LEFT HEART CATH AND CORS/GRAFTS ANGIOGRAPHY (N/A) as a surgical intervention.  The patient's history has been reviewed, patient examined, no change in status, stable for surgery.  I have reviewed the patient's chart and labs.  Questions were answered to the patient's satisfaction.    2016 Appropriate Use Criteria for Coronary Revascularization in Patients With Acute Coronary Syndrome NSTEMI/UA High Risk (TIMI Score 5-7) NSTEMI/Unstable angina, stabilized patient at high risk Link Here: https://powell.info/ Indication:  Revascularization by PCI or CABG of 1 or more arteries in a patient with NSTEMI or unstable angina with Stabilization after presentation High risk for clinical events A (7) Indication: 16; Score 7   Jacquetta Polhamus J Mackensey Bolte

## 2022-12-10 NOTE — Progress Notes (Addendum)
 Follow up visit  Subjective:   Susan Barnett, female    DOB: 09/05/1945, 76 y.o.   MRN: [id number]    HPI  Chief Complaint  Patient presents with   Chest Pain   Shortness of Breath   Hospitalization Follow-up    76 y.o. Caucasian female with hypertension, diabetes mellitus, hyperlipidemia, CAD s/p CABG x 2 SVG-diagonal, SVG-PDA-performed due to recurrent ISR and RCA, in 2015, recurrent chest pain  Patient was hospitalized earlier this month with complaints of chest pain, found to have NSTEMI with high-sensitivity troponin around 1000.  She underwent coronary bypass graft angiography.  SVG-diagonal could not be visualized.  Procedure had to be aborted due to confusion concerning for stroke.  She also had complete heart block transiently, for which she received temporary transvenous pacemaker.  Patient recovered well next day and did not have any chest pain or shortness of breath symptoms.  EKG had also normalized.  Patient was reluctant to have repeat heart catheterization to look at vein graft.  Since being home, she has had recurrent episodes of retrosternal pain radiating to her throat, as well as upper shoulder and back pain.  EKG today is concerning again for ischemic changes.  On a separate note, she has had hypoglycemia episodes at night since starting Jardiance.    Current Outpatient Medications:    acetaminophen (TYLENOL) 650 MG CR tablet, Take 1,300 mg by mouth in the morning and at bedtime., Disp: , Rfl:    amLODipine (NORVASC) 5 MG tablet, Take 5 mg by mouth daily., Disp: , Rfl:    atorvastatin (LIPITOR) 40 MG tablet, Take 1 tablet (40 mg total) by mouth daily., Disp: 30 tablet, Rfl: 2   CALCIUM PO, Take 1 tablet by mouth in the morning and at bedtime., Disp: , Rfl:    Cetirizine HCl (ZYRTEC ALLERGY) 10 MG CAPS, Take 1 tablet by mouth daily., Disp: , Rfl:    cloNIDine (CATAPRES) 0.1 MG tablet, Take 1 tablet (0.1 mg total) by mouth daily., Disp: 60 tablet, Rfl: 11    clopidogrel (PLAVIX) 75 MG tablet, Take 75 mg by mouth daily., Disp: , Rfl:    Coenzyme Q10 300 MG CAPS, Take 300 mg by mouth 2 (two) times daily., Disp: , Rfl:    D-Mannose 500 MG CAPS, Take 500 mg by mouth in the morning and at bedtime., Disp: , Rfl:    empagliflozin (JARDIANCE) 10 MG TABS tablet, Take 1 tablet (10 mg total) by mouth daily., Disp: 30 tablet, Rfl: 1   isosorbide mononitrate (IMDUR) 30 MG 24 hr tablet, Take 1 tablet (30 mg total) by mouth daily at 10 pm., Disp: 90 tablet, Rfl: 0   LANTUS SOLOSTAR 100 UNIT/ML Solostar Pen, Inject 40 Units into the skin at bedtime. (Patient taking differently: Inject 42 Units into the skin at bedtime.), Disp: 15 mL, Rfl: 0   levothyroxine (SYNTHROID, LEVOTHROID) 75 MCG tablet, Take 75 mcg by mouth daily before breakfast. , Disp: , Rfl:    losartan (COZAAR) 100 MG tablet, Take 1 tablet (100 mg total) by mouth daily., Disp: 90 tablet, Rfl: 3   metoprolol tartrate (LOPRESSOR) 50 MG tablet, Take 1 tablet (50 mg total) by mouth 2 (two) times daily., Disp: 30 tablet, Rfl: 1   Multiple Vitamin (MULTIVITAMIN ADULT PO), Take 1 tablet by mouth daily., Disp: , Rfl:    nitroGLYCERIN (NITROSTAT) 0.4 MG SL tablet, Place 1 tablet (0.4 mg total) under the tongue every 5 (five) minutes as needed for chest pain.,   Disp: 25 tablet, Rfl: 1   ONETOUCH VERIO test strip, 1 each 2 (two) times daily., Disp: , Rfl:    pantoprazole (PROTONIX) 40 MG tablet, Take 1 tablet (40 mg total) by mouth daily before breakfast., Disp: 90 tablet, Rfl: 3   Probiotic Product (ALIGN) 4 MG CAPS, Take 4 mg by mouth daily., Disp: , Rfl:    sitaGLIPtin-metformin (JANUMET) 50-1000 MG tablet, Take 1 tablet by mouth 2 (two) times daily with a meal., Disp: , Rfl:    Cardiovascular & other pertient studies:  Reviewed external labs and tests, independently interpreted  EKG 12/10/2022: Sinus rhythm 75 bpm First degree A-V block  Inferior infarct -age undetermined Lateral ST depression, consider  ischemia    Recent labs: 12/04/2022: Glucose 247, BUN/Cr 11/0.73. EGFR >60. Na/K 137/3.9. Rest of the CMP normal H/H 12/37. MCV 90. Platelets 298 HbA1C 8.7% Chol 114, TG 149, HDL 39, direct LDL 51 Lipoprotein (a) 161 TSH 0.3 normal  No arrhythmia noted on ongoing Zio monitor  Review of Systems  Cardiovascular:  Positive for chest pain. Negative for dyspnea on exertion, leg swelling, palpitations and syncope.        Vitals:   12/10/22 1230 12/10/22 1242  BP: (!) 157/71 (!) 148/76  Pulse: 78 77  SpO2: 98% 96%    Body mass index is 28.38 kg/m. Filed Weights   12/10/22 1230  Weight: 150 lb 3.2 oz (68.1 kg)     Objective:   Physical Exam Vitals and nursing note reviewed.  Constitutional:      General: She is not in acute distress. Neck:     Vascular: No JVD.  Cardiovascular:     Rate and Rhythm: Normal rate and regular rhythm.     Heart sounds: Normal heart sounds. No murmur heard. Pulmonary:     Effort: Pulmonary effort is normal.     Breath sounds: Examination of the right-lower field reveals rales. Examination of the left-lower field reveals rales. Rales present. No wheezing.  Musculoskeletal:     Right lower leg: No edema.     Left lower leg: No edema.           Visit diagnoses:   ICD-10-CM   1. Coronary artery disease involving native coronary artery of native heart with unstable angina pectoris (HCC)  I25.110 EKG 12-Lead       Orders Placed This Encounter  Procedures   EKG 12-Lead     Assessment & Recommendations:   76 y.o. Caucasian female with hypertension, diabetes mellitus, hyperlipidemia, CAD s/p CABG x 2 SVG-diagonal, SVG-PDA-performed due to recurrent ISR and RCA, in 2015, recurrent chest pain  Unstable angina: Recurrent chest pain symptoms with lateral ischemic changes very concerning for unstable angina. Absolutely recommend repeat heart catheterization to definitively look at SVG-dye graft.  If no culprit lesion identified there,  need to consider evaluation and possible revascularization of left circumflex with moderate to severe stenosis. I recommend urgent heart catheterization today.  I talked to patient and family.  They would prefer ER transfer via EMS.  Risk benefits alternate options with the patient.  Will plan on doing heart catheterization later this afternoon.       Inayah Woodin J Annalycia Done, MD Pager: [phone] Office: [phone] 

## 2022-12-10 NOTE — ED Notes (Signed)
ED TO INPATIENT HANDOFF REPORT  ED Nurse Name and Phone #: Josh  S Name/Age/Gender Susan Barnett 77 y.o. female Room/Bed: 008C/008C  Code Status   Code Status: Full Code  Home/SNF/Other Home Patient oriented to: self, place, time, and situation Is this baseline? Yes   Triage Complete: Triage complete  Chief Complaint Unstable angina (HCC) [I20.0]  Triage Note Chest pain  7/10 onset this am   states hadn a  stemi last week  but unable to finish due to other problems.   Allergies Allergies  Allergen Reactions   Asa [Aspirin] Anaphylaxis   Compazine [Prochlorperazine Edisylate] Swelling and Other (See Comments)    Tongue swelling    Nsaids Hives and Swelling   Shellfish Allergy Hives and Swelling   Sulfa Antibiotics Hives and Swelling   Zantac [Ranitidine Hcl] Hives and Swelling   Lisinopril Cough    Level of Care/Admitting Diagnosis ED Disposition     ED Disposition  Admit   Condition  --   Comment  Hospital Area: [facility] [[id number]]  Level of Care: Progressive [102]  Admit to Progressive based on following criteria: CARDIOVASCULAR & THORACIC of moderate stability with acute coronary syndrome symptoms/low risk myocardial infarction/hypertensive urgency/arrhythmias/heart failure potentially compromising stability and stable post cardiovascular intervention patients.  May admit patient to [facility] or [facility] if equivalent level of care is available:: Yes  Covid Evaluation: Asymptomatic - no recent exposure (last 10 days) testing not required  Diagnosis: Unstable angina [facility]) [[id number]]  Admitting Physician: Elder Negus [[id number]]  Attending Physician: Elder Negus [[id number]]  Certification:: I certify this patient will need inpatient services for at least 2 midnights  Estimated Length of Stay: 2          B Medical/Surgery History Past Medical History:  Diagnosis Date   Anemia    mild   Anginal pain (HCC)    with  exertion, last time prior to cardiac caht 06/02/14   Anxiety    situational   Arthritis    Breast cancer (HCC) 2018   Left Breast Cancer   CAD (coronary artery disease)    Dr. Mendel Ryder follows, no problems now   Cancer [facility])    Breast cancer   Complication of anesthesia    Diabetes mellitus, type 2 (HCC)    TYPE 2   Dysrhythmia    occ skips a beat, rate was fast before beta blocker.   Gastric polyp    Genetic testing 04/12/2017   Ms. Krell underwent genetic counseling and testing for hereditary cancer syndromes on 02/19/2017. Her results were negative for mutations in all 46 genes analyzed by [employer]'s 46-gene Common Hereditary Cancers Panel. Genes analyzed include: APC, ATM, AXIN2, BARD1, BMPR1A, BRCA1, BRCA2, BRIP1, CDH1, CDKN2A, CHEK2, CTNNA1, DICER1, EPCAM, GREM1, HOXB13, KIT, MEN1, MLH1, MSH2, MSH3, MSH6, MUTYH, NBN   GERD (gastroesophageal reflux disease)    Headache    hx of   Heartburn    Hiatal hernia    mild head tremors   History of radiation therapy 01/30/17- 02/21/17   Left Breast 42.56 Gy in 16 fractions   Hypertension    Hypothyroidism    Occasional tremors    "of head", slight hands   Osteopenia    Personal history of radiation therapy 2018   Left Breast Cancer   Pneumonia    PONV (postoperative nausea and vomiting) 02/24/14   Shortness of breath    with exertion   Sleep apnea    cpap  used nightly x 2 yrs now.   Past Surgical History:  Procedure Laterality Date   BREAST BIOPSY Left 1987   BREAST BIOPSY Left 2019   BREAST LUMPECTOMY Left 01/25/2017   BREAST LUMPECTOMY WITH RADIOACTIVE SEED AND SENTINEL LYMPH NODE BIOPSY Left 12/25/2016   Procedure: BREAST LUMPECTOMY WITH RADIOACTIVE SEED AND SENTINEL LYMPH NODE BIOPSY;  Surgeon: Glenna Fellows, MD;  Location: [facility] OR;  Service: General;  Laterality: Left;   CARDIAC CATHETERIZATION  06/02/2014   LAD 30%, D1 80%, CFX 755, RCA > 95% ISR, rx w/ PTCA, heavy calcification, EF 65%   CORONARY ARTERY BYPASS GRAFT  N/A 06/14/2014   Procedure: CORONARY ARTERY BYPASS GRAFTING (CABG);  Surgeon: Alleen Borne, MD;  Location: [facility] OR;  Service: Open Heart Surgery;  Laterality: N/A;  Times 2 using endoscopically harvested right saphenous vein.   coronary stents     x3 stents placed-prior ablation   DILATION AND CURETTAGE OF UTERUS     ELBOW FRACTURE SURGERY Left    INCONTINENCE SURGERY     sling   KNEE ARTHROSCOPY Right 02/24/2014   Procedure: RIGHT KNEE ARTHROSCOPY WITH DEBRIDEMENT ;  Surgeon: Loanne Drilling, MD;  Location: [facility] ORS;  Service: Orthopedics;  Laterality: Right;   LEFT HEART CATH AND CORS/GRAFTS ANGIOGRAPHY N/A 12/03/2022   Procedure: LEFT HEART CATH AND CORS/GRAFTS ANGIOGRAPHY;  Surgeon: Elder Negus, MD;  Location: [facility] INVASIVE CV LAB;  Service: Cardiovascular;  Laterality: N/A;   LEFT HEART CATHETERIZATION WITH CORONARY ANGIOGRAM N/A 06/02/2014   Procedure: LEFT HEART CATHETERIZATION WITH CORONARY ANGIOGRAM;  Surgeon: Lesleigh Noe, MD;  Location[facility] CATH LAB;  Service: Cardiovascular;  Laterality: N/A;   TEE WITHOUT CARDIOVERSION N/A 06/14/2014   Procedure: TRANSESOPHAGEAL ECHOCARDIOGRAM (TEE);  Surgeon: Alleen Borne, MD;  Location: [facility] OR;  Service: Open Heart Surgery;  Laterality: N/A;   TEMPORARY PACEMAKER N/A 12/03/2022   Procedure: TEMPORARY PACEMAKER;  Surgeon: Elder Negus, MD;  Location: [facility] INVASIVE CV LAB;  Service: Cardiovascular;  Laterality: N/A;     A IV Location/Drains/Wounds Patient Lines/Drains/Airways Status     Active Line/Drains/Airways     Name Placement date Placement time Site Days   Peripheral IV 12/10/22 18 G Right Antecubital 12/10/22  --  Antecubital  less than 1   Peripheral IV 12/10/22 20 G Anterior;Right Forearm 12/10/22  1548  Forearm  less than 1   External Urinary Catheter 12/03/22  2000  --  7            Intake/Output Last 24 hours No intake or output data in the 24 hours ending 12/10/22 1617  Labs/Imaging No results found for  this or any previous visit (from the past 48 hour(s)). DG CHEST PORT 1 VIEW  Result Date: 12/10/2022 CLINICAL DATA:  Chest pain. EXAM: PORTABLE CHEST 1 VIEW COMPARISON:  December 04, 2022. FINDINGS: The heart size and mediastinal contours are within normal limits. Status post coronary artery bypass graft. Possible Kerley B lines are noted bilaterally suggesting minimal pulmonary edema or bibasilar subsegmental atelectasis. The visualized skeletal structures are unremarkable. IMPRESSION: Possible Kerley B lines are noted bilaterally suggesting minimal pulmonary edema or possibly bibasilar subsegmental atelectasis. Electronically Signed   By: Lupita Raider M.D.   On: 12/10/2022 15:16    Pending Labs Unresulted Labs (From admission, onward)     Start     Ordered   12/10/22 1443  CBC  Once,   STAT        12/10/22 1443  12/10/22 1443  Basic metabolic panel  Once,   STAT        12/10/22 1443   12/10/22 1443  Brain natriuretic peptide  Once,   URGENT        12/10/22 1443            Vitals/Pain Today's Vitals   12/10/22 1545 12/10/22 1554 12/10/22 1555 12/10/22 1600  BP:   (!) 142/71 (!) 152/70  Pulse:   67 70  Resp:   16 20  Temp:      TempSrc:      SpO2:   99% 98%  PainSc: 5  3       Isolation Precautions No active isolations  Medications Medications  0.9 %  sodium chloride infusion (has no administration in time range)  ondansetron (ZOFRAN) injection 4 mg (0 mg Intravenous Hold 12/10/22 1602)  aspirin chewable tablet 324 mg (has no administration in time range)    Or  aspirin suppository 300 mg (has no administration in time range)  aspirin EC tablet 81 mg (has no administration in time range)  nitroGLYCERIN (NITROSTAT) SL tablet 0.4 mg (has no administration in time range)  acetaminophen (TYLENOL) tablet 650 mg (has no administration in time range)  ondansetron (ZOFRAN) injection 4 mg (has no administration in time range)  clopidogrel (PLAVIX) tablet 75 mg (75 mg Oral Given  12/10/22 1556)  acetaminophen (TYLENOL) tablet 1,000 mg (1,000 mg Oral Given 12/10/22 1555)  furosemide (LASIX) injection 40 mg (40 mg Intravenous Given 12/10/22 1556)    Mobility walks with device     Focused Assessments Cardiac Assessment Handoff:    Lab Results  Component Value Date   CKTOTAL 150 08/12/2007   CKMB 4.8 (H) 08/12/2007   TROPONINI (H) 08/12/2007    0.07        PERSISTENTLY INCREASED TROPONIN VALUES IN THE RANGE OF 0.06-0.49 ng/mL CAN BE SEEN IN:       -UNSTABLE ANGINA   No results found for: "DDIMER" Does the Patient currently have chest pain? Yes    R Recommendations: See Admitting Provider Note  Report given to:   Additional Notes: pt going to cath lab

## 2022-12-11 ENCOUNTER — Other Ambulatory Visit (HOSPITAL_COMMUNITY): Payer: Self-pay

## 2022-12-11 ENCOUNTER — Encounter (HOSPITAL_COMMUNITY): Payer: Self-pay | Admitting: Cardiology

## 2022-12-11 ENCOUNTER — Encounter: Payer: Self-pay | Admitting: Hematology and Oncology

## 2022-12-11 LAB — POCT ACTIVATED CLOTTING TIME
Activated Clotting Time: 179 seconds
Activated Clotting Time: 185 seconds
Activated Clotting Time: 190 seconds
Activated Clotting Time: 196 seconds

## 2022-12-11 LAB — CBC
HCT: 36 % (ref 36.0–46.0)
Hemoglobin: 11.9 g/dL — ABNORMAL LOW (ref 12.0–15.0)
MCH: 29 pg (ref 26.0–34.0)
MCHC: 33.1 g/dL (ref 30.0–36.0)
MCV: 87.6 fL (ref 80.0–100.0)
Platelets: 391 10*3/uL (ref 150–400)
RBC: 4.11 MIL/uL (ref 3.87–5.11)
RDW: 13.2 % (ref 11.5–15.5)
WBC: 14.2 10*3/uL — ABNORMAL HIGH (ref 4.0–10.5)
nRBC: 0 % (ref 0.0–0.2)

## 2022-12-11 LAB — BASIC METABOLIC PANEL
Anion gap: 11 (ref 5–15)
BUN: 10 mg/dL (ref 8–23)
CO2: 22 mmol/L (ref 22–32)
Calcium: 8.7 mg/dL — ABNORMAL LOW (ref 8.9–10.3)
Chloride: 101 mmol/L (ref 98–111)
Creatinine, Ser: 0.65 mg/dL (ref 0.44–1.00)
GFR, Estimated: >60 mL/min (ref >60)
Glucose, Bld: 102 mg/dL — ABNORMAL HIGH (ref 70–99)
Potassium: 2.8 mmol/L — ABNORMAL LOW (ref 3.5–5.1)
Sodium: 134 mmol/L — ABNORMAL LOW (ref 135–145)

## 2022-12-11 LAB — GLUCOSE, CAPILLARY
Glucose-Capillary: 103 mg/dL — ABNORMAL HIGH (ref 70–99)
Glucose-Capillary: 96 mg/dL (ref 70–99)
Glucose-Capillary: 184 mg/dL — ABNORMAL HIGH (ref 70–99)

## 2022-12-11 MED ORDER — LEVOTHYROXINE SODIUM 75 MCG PO TABS: 75.0000 ug | ORAL_TABLET | Freq: Every day | ORAL | Status: DC

## 2022-12-11 MED ORDER — TICAGRELOR 90 MG PO TABS
90.0000 mg | ORAL_TABLET | Freq: Two times a day (BID) | ORAL | 2 refills | Status: DC
  Filled 2022-12-11: qty 60, 30d supply, fill #0

## 2022-12-11 MED ORDER — ATROPINE SULFATE 1 MG/10ML IJ SOSY
PREFILLED_SYRINGE | INTRAMUSCULAR | Status: AC
  Filled 2022-12-11: qty 10

## 2022-12-11 MED ORDER — LOSARTAN POTASSIUM 50 MG PO TABS
100.0000 mg | ORAL_TABLET | Freq: Every day | ORAL | Status: DC
  Administered 2022-12-11: 100 mg via ORAL
  Filled 2022-12-11: qty 2

## 2022-12-11 MED ORDER — LANTUS SOLOSTAR 100 UNIT/ML ~~LOC~~ SOPN: 10.0000 [IU] | PEN_INJECTOR | Freq: Every day | SUBCUTANEOUS | 0 refills | Status: AC

## 2022-12-11 MED FILL — Verapamil HCl IV Soln 2.5 MG/ML: INTRAVENOUS | Qty: 2 | Status: AC

## 2022-12-11 NOTE — Progress Notes (Signed)
Mobility Specialist - Progress Note   12/11/22 1316  Mobility  Activity Ambulated with assistance in hallway  Level of Assistance Minimal assist, patient does 75% or more  Assistive Device Front wheel walker  Distance Ambulated (ft) 100 ft  Activity Response Tolerated well  Mobility Referral Yes  $Mobility charge 1 Mobility   Pt was received in bed and agreeable to mobility. Pt was MinA to stand from bed and CG throughout ambulation. Pt c/o LLE weakness throughout. Pt was returned to EOB with all needs met and family present. RN aware.   Alda Lea  Mobility Specialist Please contact via Special educational needs teacher or Rehab office at [phone]

## 2022-12-11 NOTE — Discharge Summary (Signed)
Physician Discharge Summary  Patient ID: Susan Barnett MRN: [id number] DOB/AGE: 1946/05/17 77 y.o.  Admit date: 12/10/2022 Discharge date: 12/11/2022  Primary Discharge Diagnosis: NSTEMI  Secondary Discharge Diagnosis: Hypertension Type 2 diabetes mellitus Mixed hyperlipidemia Coronary artery disease   Hospital Course:   77 y.o. Caucasian female with hypertension, diabetes mellitus, hyperlipidemia, CAD s/p CABG x 2 SVG-diagonal, SVG-PDA-performed due to recurrent ISR and RCA, in 2015, recurrent chest pain, NSTEMI  Patient underwent successful intervention to the left circumflex, details below.  Today, patient is doing well.  Her skin color has significantly improved.  She ambulated with a any chest pain or discomfort.  Home health OT/PT was ordered.  Patient has been having hypoglycemia episodes at home starting Jardiance, and therefore has been skipping Lantus.  She stayed n.p.o. overnight due to indwelling sheath, Lantus was held.  In spite of that, blood sugar remains low 100s.  I will discharge her on Lantus 10 units as opposed to her baseline dose of 40.  I have also discontinued Janumet, continue Jardiance.  I have encouraged her to follow-up with her PCP regarding further adjustment of her diabetes medications.  Did the patient have an acute coronary syndrome (MI, NSTEMI, STEMI, etc) this admission?: Yes                               AHA/ACC Clinical Performance & Quality Measures: Aspirin prescribed? - Yes ADP Receptor Inhibitor (Plavix/Clopidogrel, Brilinta/Ticagrelor or Effient/Prasugrel) prescribed (includes medically managed patients)? - Yes Aspirin not prescribed, as patient is allergic.  She will continue monotherapy with Brilinta for 6 months, then switch to Plavix. Beta Blocker prescribed? - Yes High Intensity Statin (Lipitor 40-80mg  or Crestor 20-40mg ) prescribed? - Yes EF assessed during THIS hospitalization? - No - echocardiogram 12/02/2020 with preserved LVEF. For  EF <40%, was ACEI/ARB prescribed? - Not Applicable (EF >/= 40%) For EF <40%, Aldosterone Antagonist (Spironolactone or Eplerenone) prescribed? - Not Applicable (EF >/= 40%) Cardiac Rehab Phase II ordered (including medically managed patients)? - Yes    Discharge Exam: Blood pressure (!) 155/63, pulse 73, temperature 98.5 F (36.9 C), temperature source Oral, resp. rate 16, SpO2 97 %.   Physical Exam Vitals and nursing note reviewed.  Constitutional:      General: She is not in acute distress. Neck:     Vascular: No JVD.  Cardiovascular:     Rate and Rhythm: Normal rate and regular rhythm.     Heart sounds: Normal heart sounds. No murmur heard. Pulmonary:     Effort: Pulmonary effort is normal.     Breath sounds: Normal breath sounds. No wheezing or rales.  Musculoskeletal:     Right lower leg: No edema.     Left lower leg: No edema.      Significant Diagnostic Studies:  EKG 12/11/2022: Sinus rhythm Inferior infarct, age indeterminate: Nonspecific ST ST abnormality    FOLLOW UP PLANS AND APPOINTMENTS Discharge Instructions     Amb Referral to Cardiac Rehabilitation   Complete by: As directed    Diagnosis:  Coronary Stents NSTEMI PTCA     After initial evaluation and assessments completed: Virtual Based Care may be provided alone or in conjunction with Phase 2 Cardiac Rehab based on patient barriers.: Yes   Intensive Cardiac Rehabilitation (ICR) [facility] location only OR Traditional Cardiac Rehabilitation (TCR) *If criteria for ICR are not met will enroll in TCR [facility] only): Yes   Diet - low sodium heart  healthy   Complete by: As directed    Increase activity slowly   Complete by: As directed       Allergies as of 12/11/2022       Reactions   Asa [aspirin] Anaphylaxis   Compazine [prochlorperazine Edisylate] Swelling, Other (See Comments)   Tongue swelling    Nsaids Hives, Swelling   Shellfish Allergy Hives, Swelling   Sulfa Antibiotics Hives, Swelling   Zantac  [ranitidine Hcl] Hives, Swelling   Lisinopril Cough        Medication List     STOP taking these medications    clopidogrel 75 MG tablet Commonly known as: PLAVIX   isosorbide mononitrate 30 MG 24 hr tablet Commonly known as: IMDUR   sitaGLIPtin-metformin 50-1000 MG tablet Commonly known as: JANUMET       TAKE these medications    acetaminophen 650 MG CR tablet Commonly known as: TYLENOL Take 1,300 mg by mouth in the morning and at bedtime.   Align 4 MG Caps Take 4 mg by mouth daily.   amLODipine 5 MG tablet Commonly known as: NORVASC Take 5 mg by mouth daily.   atorvastatin 40 MG tablet Commonly known as: LIPITOR Take 1 tablet (40 mg total) by mouth daily.   CALCIUM PO Take 1 tablet by mouth in the morning and at bedtime.   cloNIDine 0.1 MG tablet Commonly known as: CATAPRES Take 1 tablet (0.1 mg total) by mouth daily.   Coenzyme Q10 300 MG Caps Take 300 mg by mouth 2 (two) times daily.   D-Mannose 500 MG Caps Take 500 mg by mouth in the morning and at bedtime.   empagliflozin 10 MG Tabs tablet Commonly known as: JARDIANCE Take 1 tablet (10 mg total) by mouth daily.   hydrochlorothiazide 12.5 MG capsule Commonly known as: MICROZIDE Take 12.5 mg by mouth 3 (three) times a week.   Lantus SoloStar 100 UNIT/ML Solostar Pen Generic drug: insulin glargine Inject 10 Units into the skin at bedtime. What changed: how much to take   levothyroxine 75 MCG tablet Commonly known as: SYNTHROID Take 75 mcg by mouth daily before breakfast.   losartan 100 MG tablet Commonly known as: COZAAR Take 1 tablet (100 mg total) by mouth daily.   metoprolol tartrate 50 MG tablet Commonly known as: LOPRESSOR Take 1 tablet (50 mg total) by mouth 2 (two) times daily.   MULTIVITAMIN ADULT PO Take 1 tablet by mouth daily.   nitroGLYCERIN 0.4 MG SL tablet Commonly known as: NITROSTAT Place 1 tablet (0.4 mg total) under the tongue every 5 (five) minutes as needed for  chest pain.   OneTouch Verio test strip Generic drug: glucose blood 1 each 2 (two) times daily.   pantoprazole 40 MG tablet Commonly known as: PROTONIX Take 1 tablet (40 mg total) by mouth daily before breakfast.   ticagrelor 90 MG Tabs tablet Commonly known as: BRILINTA Take 1 tablet (90 mg total) by mouth 2 (two) times daily.   ZyrTEC Allergy 10 MG Caps Generic drug: Cetirizine HCl Take 1 tablet by mouth daily.        Follow-up Information     [place], [facility] Follow up.   Why: Physical and Occupational Therapy-office to call with visit times. Contact information: [address] [place] [address] [phone]         Tessa Lerner, DO Follow up on 12/13/2022.   Specialties: Cardiology, Vascular Surgery Why: 11:45 AM Contact information: [address]  [place] [address] [phone]                   Elder Negus, MD Pager: [phone] Office: [phone]

## 2022-12-11 NOTE — TOC Initial Note (Signed)
Transition of Care [facility]) - Initial/Assessment Note    Patient Details  Name: Susan Barnett MRN: [id number] Date of Birth: [birth year]  Transition [facility] Care [facility]) CM/SW Contact:    Gala Lewandowsky, RN Phone Number: 12/11/2022, 9:49 AM  Clinical Narrative: Risk for readmission assessment completed. PTA patient was from home with spouse. Patient has DME: patient has rollator, rolling walker, and cane in the home. No DME needs identified. Patient is active with [facility] for PT/OT-patient will need resumption orders prior to transition home. No further needs identified at this time.                Expected Discharge Plan: Home w Home Health Services Barriers to Discharge: No Barriers Identified   Patient Goals and CMS Choice Patient states their goals for this hospitalization and ongoing recovery are:: to return home.   Choice offered to / list presented to : NA (currently active with [facility].)     Expected Discharge Plan and Services In-house Referral: NA Discharge Planning Services: CM Consult Post Acute Care Choice: Resumption of Svcs/PTA Provider, Home Health Living arrangements for the past 2 months: Single Family Home                   DME Agency: NA       HH Arranged: PT, OT   Date HH Agency Contacted: 12/11/22 Time HH Agency Contacted: [phone] Representative spoke with at [facility] Agency: Morrie Sheldon  Prior Living Arrangements/Services Living arrangements for the past 2 months: Single Family Home Lives with:: Spouse Patient language and need for interpreter reviewed:: Yes Do you feel safe going back to the place where you live?: Yes      Need for Family Participation in Patient Care: Yes (Comment) Care giver support system in place?: Yes (comment) Current home services: DME (patient has rollator, rolling walker and cane) Criminal Activity/Legal Involvement Pertinent to Current Situation/Hospitalization: No - Comment as needed  Permission Sought/Granted Permission  sought to share information with : Family Supports, Case Manager Permission granted to share information with : Yes, Verbal Permission Granted     Permission granted to share info w AGENCY: [facility].        Emotional Assessment Appearance:: Appears stated age Attitude/Demeanor/Rapport: Engaged Affect (typically observed): Appropriate Orientation: : Oriented to Place, Oriented to  Time, Oriented to Situation, Oriented to Self Alcohol / Substance Use: Not Applicable Psych Involvement: No (comment)  Admission diagnosis:  Unstable angina (HCC) [I20.0] NSTEMI (non-ST elevated myocardial infarction) [facility]) [I21.4] Patient Active Problem List   Diagnosis Date Noted   Unstable angina (HCC) 12/10/2022   Transient complete heart block (HCC) 12/05/2022   NSTEMI (non-ST elevated myocardial infarction) (HCC) 12/03/2022   Chest pain of uncertain etiology 12/03/2022   Hypokalemia 12/03/2022   Chronic diastolic CHF (congestive heart failure) (HCC) 12/03/2022   Hx of CABG 12/03/2022   DM type 2 with diabetic mixed hyperlipidemia (HCC) 12/03/2022   Sleep apnea in adult 12/03/2022   Atherosclerosis of native coronary artery of native heart with unstable angina pectoris (HCC) 12/03/2022   Hyponatremia 12/18/2019   Iron deficiency anemia 08/20/2017   Obstructive sleep apnea 05/16/2017   Genetic testing 04/12/2017   Malignant neoplasm of upper-outer quadrant of left breast in female, estrogen receptor positive (HCC) 10/15/2016   Hyperlipidemia 07/14/2015   Coronary artery disease involving native coronary artery of native heart with unstable angina pectoris (HCC) 06/14/2014   Lateral meniscal tear 02/23/2014   GASTRIC POLYP 11/19/2007   Acquired hypothyroidism 11/19/2007  DM2 (diabetes mellitus, type 2) (HCC) 11/19/2007   Essential hypertension 11/19/2007   GERD (gastroesophageal reflux disease) 11/19/2007   HIATAL HERNIA 11/19/2007   RECTAL BLEEDING 11/19/2007   PCP:  Johny Blamer,  MD Pharmacy:   [facility] [address] [place] - [address] [place] [address] Phone: [phone] Fax: [phone]  [facility] [address] [place] [address] Phone: [phone] Fax: [phone]  [facility] - [place], [place] - [address] [place] [address] Phone: [phone] Fax: [phone]  Social Determinants of Health (SDOH) Social History: SDOH Screenings   Tobacco Use: Low Risk  (12/11/2022)   Readmission Risk Interventions    12/11/2022    9:47 AM  Readmission Risk Prevention Plan  Transportation Screening Complete  HRI or Home Care Consult Complete  Social Work Consult for Recovery Care Planning/Counseling Complete  Palliative Care Screening Not Applicable  Medication Review Oceanographer) Complete

## 2022-12-11 NOTE — Progress Notes (Signed)
Pt unfortunately still on bedrest. Got food and ordered breakfast. Discussed/reviewed MI, restrictions, stent/Brilinta importance, walking at home, NTG and CRPII. Pt already referred last week for [place] CRPII. Will f/u for ambulation after bedrest.  [phone] Ethelda Chick BS, ACSM-CEP 12/11/2022 9:13 AM

## 2022-12-11 NOTE — Progress Notes (Signed)
ACT 190; unable to D/C sheath at this time. Will recheck ACT in 2hrs.Mamie Levers

## 2022-12-11 NOTE — H&P (Signed)
Susan Barnett is an 77 y.o. female.   Chief Complaint: Chest pain HPI:   77 y.o. Caucasian female with hypertension, diabetes mellitus, hyperlipidemia, CAD s/p CABG x 2 SVG-diagonal, SVG-PDA-performed due to recurrent ISR and RCA, in 2015, recurrent chest pain.  Patient was seen by me in the office earlier today, my note below.  Patient was hospitalized earlier this month with complaints of chest pain, found to have NSTEMI with high-sensitivity troponin around 1000.  She underwent coronary bypass graft angiography.  SVG-diagonal could not be visualized.  Procedure had to be aborted due to confusion concerning for stroke.  She also had complete heart block transiently, for which she received temporary transvenous pacemaker.  Patient recovered well next day and did not have any chest pain or shortness of breath symptoms.  EKG had also normalized.  Patient was reluctant to have repeat heart catheterization to look at vein graft.   Since being home, she has had recurrent episodes of retrosternal pain radiating to her throat, as well as upper shoulder and back pain.  EKG today is concerning again for ischemic changes.   On a separate note, she has had hypoglycemia episodes at night since starting Jardiance.  Today, troponin was found to be higher than a week ago, at 1200.  She underwent coronary bypass graft angiography, details below.  She underwent intervention to the circumflex.  Currently, she is chest pain-free.  She will be admitted overnight.      Past Medical History:  Diagnosis Date   Anemia    mild   Anginal pain (HCC)    with exertion, last time prior to cardiac caht 06/02/14   Anxiety    situational   Arthritis    Breast cancer (HCC) 2018   Left Breast Cancer   CAD (coronary artery disease)    Dr. Mendel Ryder follows, no problems now   Cancer [facility])    Breast cancer   Complication of anesthesia    Diabetes mellitus, type 2 (HCC)    TYPE 2   Dysrhythmia    occ skips a beat,  rate was fast before beta blocker.   Gastric polyp    Genetic testing 04/12/2017   Ms. Bracy underwent genetic counseling and testing for hereditary cancer syndromes on 02/19/2017. Her results were negative for mutations in all 46 genes analyzed by [facility]'s 46-gene Common Hereditary Cancers Panel. Genes analyzed include: APC, ATM, AXIN2, BARD1, BMPR1A, BRCA1, BRCA2, BRIP1, CDH1, CDKN2A, CHEK2, CTNNA1, DICER1, EPCAM, GREM1, HOXB13, KIT, MEN1, MLH1, MSH2, MSH3, MSH6, MUTYH, NBN   GERD (gastroesophageal reflux disease)    Headache    hx of   Heartburn    Hiatal hernia    mild head tremors   History of radiation therapy 01/30/17- 02/21/17   Left Breast 42.56 Gy in 16 fractions   Hypertension    Hypothyroidism    Occasional tremors    "of head", slight hands   Osteopenia    Personal history of radiation therapy 2018   Left Breast Cancer   Pneumonia    PONV (postoperative nausea and vomiting) 02/24/14   Shortness of breath    with exertion   Sleep apnea    cpap used nightly x 2 yrs now.    Past Surgical History:  Procedure Laterality Date   BREAST BIOPSY Left 1987   BREAST BIOPSY Left 2019   BREAST LUMPECTOMY Left 01/25/2017   BREAST LUMPECTOMY WITH RADIOACTIVE SEED AND SENTINEL LYMPH NODE BIOPSY Left 12/25/2016   Procedure: BREAST LUMPECTOMY  WITH RADIOACTIVE SEED AND SENTINEL LYMPH NODE BIOPSY;  Surgeon: Glenna Fellows, MD;  Location: [facility] OR;  Service: General;  Laterality: Left;   CARDIAC CATHETERIZATION  06/02/2014   LAD 30%, D1 80%, CFX 755, RCA > 95% ISR, rx w/ PTCA, heavy calcification, EF 65%   CORONARY ARTERY BYPASS GRAFT N/A 06/14/2014   Procedure: CORONARY ARTERY BYPASS GRAFTING (CABG);  Surgeon: Alleen Borne, MD;  Location: [facility] OR;  Service: Open Heart Surgery;  Laterality: N/A;  Times 2 using endoscopically harvested right saphenous vein.   CORONARY STENT INTERVENTION N/A 12/10/2022   Procedure: CORONARY STENT INTERVENTION;  Surgeon: Elder Negus, MD;  Location:  [facility] INVASIVE CV LAB;  Service: Cardiovascular;  Laterality: N/A;   coronary stents     x3 stents placed-prior ablation   CORONARY ULTRASOUND/IVUS N/A 12/10/2022   Procedure: Coronary Ultrasound/IVUS;  Surgeon: Elder Negus, MD;  Location: [facility] INVASIVE CV LAB;  Service: Cardiovascular;  Laterality: N/A;   DILATION AND CURETTAGE OF UTERUS     ELBOW FRACTURE SURGERY Left    INCONTINENCE SURGERY     sling   KNEE ARTHROSCOPY Right 02/24/2014   Procedure: RIGHT KNEE ARTHROSCOPY WITH DEBRIDEMENT ;  Surgeon: Loanne Drilling, MD;  Location: [facility] ORS;  Service: Orthopedics;  Laterality: Right;   LEFT HEART CATH AND CORS/GRAFTS ANGIOGRAPHY N/A 12/03/2022   Procedure: LEFT HEART CATH AND CORS/GRAFTS ANGIOGRAPHY;  Surgeon: Elder Negus, MD;  Location: [facility] INVASIVE CV LAB;  Service: Cardiovascular;  Laterality: N/A;   LEFT HEART CATH AND CORS/GRAFTS ANGIOGRAPHY N/A 12/10/2022   Procedure: LEFT HEART CATH AND CORS/GRAFTS ANGIOGRAPHY;  Surgeon: Elder Negus, MD;  Location: [facility] INVASIVE CV LAB;  Service: Cardiovascular;  Laterality: N/A;   LEFT HEART CATHETERIZATION WITH CORONARY ANGIOGRAM N/A 06/02/2014   Procedure: LEFT HEART CATHETERIZATION WITH CORONARY ANGIOGRAM;  Surgeon: Lesleigh Noe, MD;  Location: [facility] CATH LAB;  Service: Cardiovascular;  Laterality: N/A;   TEE WITHOUT CARDIOVERSION N/A 06/14/2014   Procedure: TRANSESOPHAGEAL ECHOCARDIOGRAM (TEE);  Surgeon: Alleen Borne, MD;  Location: [facility] OR;  Service: Open Heart Surgery;  Laterality: N/A;   TEMPORARY PACEMAKER N/A 12/03/2022   Procedure: TEMPORARY PACEMAKER;  Surgeon: Elder Negus, MD;  Location: [facility] INVASIVE CV LAB;  Service: Cardiovascular;  Laterality: N/A;     Family History  Problem Relation Age of Onset   Heart attack Mother    Hypertension Mother    Diabetes Mother    Lung cancer Brother        d.57 from metastatic disease. History of smoking.   Healthy Brother    Hodgkin's lymphoma Maternal Uncle        d.62s    Ulcerative colitis Daughter    Breast cancer Cousin 30       d.30s    Social History:  reports that she has never smoked. She has never used smokeless tobacco. She reports that she does not drink alcohol and does not use drugs.  Allergies:  Allergies  Allergen Reactions   Asa [Aspirin] Anaphylaxis   Compazine [Prochlorperazine Edisylate] Swelling and Other (See Comments)    Tongue swelling    Nsaids Hives and Swelling   Shellfish Allergy Hives and Swelling   Sulfa Antibiotics Hives and Swelling   Zantac [Ranitidine Hcl] Hives and Swelling   Lisinopril Cough    Review of Systems  Cardiovascular:  Positive for chest pain (Present on admission, now resolved). Negative for dyspnea on exertion, leg swelling, palpitations and syncope.  Blood pressure (!) 155/63, pulse 73, temperature 98.5 F (36.9 C), temperature source Oral, resp. rate 16, SpO2 97 %. There is no height or weight on file to calculate BMI.   Physical Exam Vitals and nursing note reviewed.  Constitutional:      General: She is not in acute distress. Neck:     Vascular: No JVD.  Cardiovascular:     Rate and Rhythm: Normal rate and regular rhythm.     Heart sounds: Normal heart sounds. No murmur heard. Pulmonary:     Effort: Pulmonary effort is normal.     Breath sounds: Rales present. No wheezing.  Skin:    Coloration: Skin is pale.      Medications Prior to Admission  Medication Sig Dispense Refill   acetaminophen (TYLENOL) 650 MG CR tablet Take 1,300 mg by mouth in the morning and at bedtime.     amLODipine (NORVASC) 5 MG tablet Take 5 mg by mouth daily.     atorvastatin (LIPITOR) 40 MG tablet Take 1 tablet (40 mg total) by mouth daily. 30 tablet 2   CALCIUM PO Take 1 tablet by mouth in the morning and at bedtime.     Cetirizine HCl (ZYRTEC ALLERGY) 10 MG CAPS Take 1 tablet by mouth daily.     cloNIDine (CATAPRES) 0.1 MG tablet Take 1 tablet (0.1 mg total) by mouth daily. 60 tablet 11    clopidogrel (PLAVIX) 75 MG tablet Take 75 mg by mouth daily.     Coenzyme Q10 300 MG CAPS Take 300 mg by mouth 2 (two) times daily.     D-Mannose 500 MG CAPS Take 500 mg by mouth in the morning and at bedtime.     empagliflozin (JARDIANCE) 10 MG TABS tablet Take 1 tablet (10 mg total) by mouth daily. 30 tablet 1   hydrochlorothiazide (MICROZIDE) 12.5 MG capsule Take 12.5 mg by mouth 3 (three) times a week.     isosorbide mononitrate (IMDUR) 30 MG 24 hr tablet Take 1 tablet (30 mg total) by mouth daily at 10 pm. 90 tablet 0   levothyroxine (SYNTHROID, LEVOTHROID) 75 MCG tablet Take 75 mcg by mouth daily before breakfast.      losartan (COZAAR) 100 MG tablet Take 1 tablet (100 mg total) by mouth daily. 90 tablet 3   metoprolol tartrate (LOPRESSOR) 50 MG tablet Take 1 tablet (50 mg total) by mouth 2 (two) times daily. 30 tablet 1   Multiple Vitamin (MULTIVITAMIN ADULT PO) Take 1 tablet by mouth daily.     nitroGLYCERIN (NITROSTAT) 0.4 MG SL tablet Place 1 tablet (0.4 mg total) under the tongue every 5 (five) minutes as needed for chest pain. 25 tablet 1   pantoprazole (PROTONIX) 40 MG tablet Take 1 tablet (40 mg total) by mouth daily before breakfast. 90 tablet 3   Probiotic Product (ALIGN) 4 MG CAPS Take 4 mg by mouth daily.     sitaGLIPtin-metformin (JANUMET) 50-1000 MG tablet Take 1 tablet by mouth 2 (two) times daily with a meal.     [DISCONTINUED] LANTUS SOLOSTAR 100 UNIT/ML Solostar Pen Inject 40 Units into the skin at bedtime. (Patient taking differently: Inject 42 Units into the skin at bedtime.) 15 mL 0   ONETOUCH VERIO test strip 1 each 2 (two) times daily.        Current Facility-Administered Medications:    0.9 %  sodium chloride infusion, 250 mL, Intravenous, PRN, Chandni Gagan J, MD   acetaminophen (TYLENOL) tablet 1,000 mg, 1,000 mg, Oral, Q8H,  Elder Negus, MD, 1,000 mg at 12/11/22 1610   acetaminophen (TYLENOL) tablet 650 mg, 650 mg, Oral, Q4H PRN, Ommie Degeorge  J, MD   amLODipine (NORVASC) tablet 5 mg, 5 mg, Oral, Daily, Coreena Rubalcava J, MD, 5 mg at 12/11/22 0954   atorvastatin (LIPITOR) tablet 40 mg, 40 mg, Oral, Daily, Clarity Ciszek J, MD, 40 mg at 12/11/22 0954   cloNIDine (CATAPRES) tablet 0.1 mg, 0.1 mg, Oral, Daily, Latwan Luchsinger J, MD, 0.1 mg at 12/11/22 0954   heparin injection 5,000 Units, 5,000 Units, Subcutaneous, Q8H, Luz Mares J, MD   insulin aspart (novoLOG) injection 0-15 Units, 0-15 Units, Subcutaneous, TID WC, Persephonie Hegwood J, MD   insulin aspart (novoLOG) injection 4 Units, 4 Units, Subcutaneous, TID WC, Samy Ryner J, MD   insulin glargine-yfgn (SEMGLEE) injection 20 Units, 20 Units, Subcutaneous, QHS, Ashland Osmer J, MD   [START ON 12/12/2022] levothyroxine (SYNTHROID) tablet 75 mcg, 75 mcg, Oral, QAC breakfast, Bryar Dahms J, MD   losartan (COZAAR) tablet 100 mg, 100 mg, Oral, Daily, Itzae Mccurdy J, MD, 100 mg at 12/11/22 1002   metoprolol tartrate (LOPRESSOR) tablet 50 mg, 50 mg, Oral, BID, Saketh Daubert J, MD, 50 mg at 12/11/22 0954   nitroGLYCERIN (NITROSTAT) SL tablet 0.4 mg, 0.4 mg, Sublingual, Q5 Min x 3 PRN, Li Bobo J, MD   ondansetron (ZOFRAN) injection 4 mg, 4 mg, Intravenous, Once, Vivien Rossetti C, MD   ondansetron (ZOFRAN) injection 4 mg, 4 mg, Intravenous, Q6H PRN, Duan Scharnhorst J, MD   pantoprazole (PROTONIX) EC tablet 40 mg, 40 mg, Oral, QAC breakfast, Renwick Asman J, MD, 40 mg at 12/11/22 0955   sodium chloride flush (NS) 0.9 % injection 3 mL, 3 mL, Intravenous, Q12H, Marcella Dunnaway J, MD, 3 mL at 12/11/22 0955   sodium chloride flush (NS) 0.9 % injection 3 mL, 3 mL, Intravenous, PRN, Caydon Feasel J, MD   ticagrelor (BRILINTA) tablet 90 mg, 90 mg, Oral, BID, Meighan Treto J, MD, 90 mg at 12/11/22 0644   Today's Vitals   12/10/22 2045 12/10/22 2326 12/11/22 0304 12/11/22 0735  BP:  (!) 144/66 (!) 148/65 (!) 155/63   Pulse:  65 65 73  Resp:  18 18 16   Temp:  97.8 F (36.6 C) 97.7 F (36.5 C) 98.5 F (36.9 C)  TempSrc:  Oral Oral Oral  SpO2:  96% 95% 97%  PainSc: 0-No pain   0-No pain   There is no height or weight on file to calculate BMI.    Lab Results: Reviewed and interpreted: CBC, BMP, trop HS    Tests ordered:  Lab Orders         CBC         Basic metabolic panel         Brain natriuretic peptide         Basic metabolic panel         CBC         Glucose, capillary         Glucose, capillary         Glucose, capillary         POCT Activated clotting time         POCT Activated clotting time         POCT Activated clotting time         POCT Activated clotting time         POCT Activated clotting time         POCT  Activated clotting time         POCT Activated clotting time       Cardiac Studies:  Telemetry 12/10/2022; No significant arrhythmia  Coronary intervention 12/10/2022: LM: Mild disease LAD: Prox-mid 30% diffuse disease, distal 75% diffuse disease Lcx: Prox 30%, mid 75% diffuse disease, followed by focal 90% stenosis RCA: Not engaged today. Known Prox 80% diffuse disease, followed 100 mid ISR SVG-PDA: Not engaged today. Known patent SVG-diag: Patent. No significant disease   LVEDP 17 mmHg   Successful percutaneous coronary intervention mid LCx        PTCA and stent placement with overlapping stents        2.0 X 18 mm and 2.25 X 15 Onyx drug-eluting stent        Post dilatation with 2.25 mm stent balloon and 2.5X15 mm [place] balloon up to 16 atm     EKG 12/10/2022: Sinus rhythm with lateral ST depression, consider ischemia Patient admitted.  About Isolated ST elevation in lead III, unchanged from before, not STEMI  Echocardiogram 12/03/2022:  1. Left ventricular ejection fraction, by estimation, is 60 to 65%. The  left ventricle has normal function. There is mild concentric left  ventricular hypertrophy. Left ventricular diastolic parameters are   consistent with Grade II diastolic dysfunction  (pseudonormalization). On the 2-chamber contrast images there appears to  be a small segment of akinesis in the basilar to mid inferior wall. This  is not seen in any other images.   2. Right ventricular systolic function is normal. The right ventricular  size is normal. There is normal pulmonary artery systolic pressure.   3. Left atrial size was mildly dilated.   4. The mitral valve is normal in structure. Mild mitral valve  regurgitation. No evidence of mitral stenosis.   5. The aortic valve is tricuspid. There is mild calcification of the  aortic valve. Aortic valve regurgitation is trivial. No aortic stenosis is  present.   6. The inferior vena cava is normal in size with <50% respiratory  variability, suggesting right atrial pressure of 8 mmHg.     Imaging/tests reviewed and independently interpreted: CXR 12/10/2022: Possible Kerley B lines are noted bilaterally suggesting minimal pulmonary edema or possibly bibasilar subsegmental atelectasis.    Assessment & Recommendations:  77 y.o. Caucasian female with hypertension, diabetes mellitus, hyperlipidemia, CAD s/p CABG x 2 SVG-diagonal, SVG-PDA-performed due to recurrent ISR and RCA, in 2015, recurrent chest pain.  NSTEMI: S/p PCI to culprit mid LCx Allergic to Aspirin, recommend monotherapy with Brilinta at least for 6 months.  Can switch to Plavix after that. Okay to stop Imdur. Could also consider discontinuing metoprolol patient unless needed for hypertension. Continue management of diabetes and hyperlipidemia.  Hypertension: Continue losartan daily, clonidine daily, amlodipine daily, hydrochlorothiazide 3 times a week  Type 2 diabetes mellitus: She has been having hypoglycemia at home. Reduce Lantus dose to 20 units, can be held if patient stays n.p.o. overnight. Discontinue Janumet, continue Jardiance outpatient. Mixed hyperlipidemia: Continue Lipitor 40 mg  daily.     Elder Negus, MD Pager: [phone] Office: [phone]

## 2022-12-11 NOTE — Progress Notes (Signed)
Act 195, unable to D/C femoral sheath until act < 175 per protocol. Heparin removed from flush and replaced with normal saline. Primary RN notified to reassess act in 1 hour for possible D/C of sheath.

## 2022-12-11 NOTE — Progress Notes (Signed)
ACT 186; unable to D/C sheath at this time. Will reevaluate for sheath pull.Mamie Levers

## 2022-12-11 NOTE — Progress Notes (Signed)
ACT 179; okay to remove sheath per Dr. Rosemary Holms. 25fr sheath removed intact from left groin. Manual pressure applied x 45 min. No bleeding or hematoma palpable. 4x4 gauze and tegaderm applied to site. Post activity and precautions explained. Care released to [place], [place]. Call bell near.Mamie Levers

## 2022-12-12 ENCOUNTER — Telehealth (HOSPITAL_COMMUNITY): Payer: Self-pay

## 2022-12-12 ENCOUNTER — Telehealth: Payer: Self-pay

## 2022-12-12 DIAGNOSIS — Z7902 Long term (current) use of antithrombotics/antiplatelets: Secondary | ICD-10-CM | POA: Diagnosis not present

## 2022-12-12 DIAGNOSIS — G4733 Obstructive sleep apnea (adult) (pediatric): Secondary | ICD-10-CM | POA: Diagnosis not present

## 2022-12-12 DIAGNOSIS — E782 Mixed hyperlipidemia: Secondary | ICD-10-CM | POA: Diagnosis not present

## 2022-12-12 DIAGNOSIS — E1169 Type 2 diabetes mellitus with other specified complication: Secondary | ICD-10-CM | POA: Diagnosis not present

## 2022-12-12 DIAGNOSIS — D649 Anemia, unspecified: Secondary | ICD-10-CM | POA: Diagnosis not present

## 2022-12-12 DIAGNOSIS — I214 Non-ST elevation (NSTEMI) myocardial infarction: Secondary | ICD-10-CM | POA: Diagnosis not present

## 2022-12-12 DIAGNOSIS — I442 Atrioventricular block, complete: Secondary | ICD-10-CM | POA: Diagnosis not present

## 2022-12-12 DIAGNOSIS — F419 Anxiety disorder, unspecified: Secondary | ICD-10-CM | POA: Diagnosis not present

## 2022-12-12 DIAGNOSIS — I110 Hypertensive heart disease with heart failure: Secondary | ICD-10-CM | POA: Diagnosis not present

## 2022-12-12 DIAGNOSIS — M1990 Unspecified osteoarthritis, unspecified site: Secondary | ICD-10-CM | POA: Diagnosis not present

## 2022-12-12 DIAGNOSIS — I5032 Chronic diastolic (congestive) heart failure: Secondary | ICD-10-CM | POA: Diagnosis not present

## 2022-12-12 DIAGNOSIS — M8580 Other specified disorders of bone density and structure, unspecified site: Secondary | ICD-10-CM | POA: Diagnosis not present

## 2022-12-12 DIAGNOSIS — I2510 Atherosclerotic heart disease of native coronary artery without angina pectoris: Secondary | ICD-10-CM | POA: Diagnosis not present

## 2022-12-12 DIAGNOSIS — E876 Hypokalemia: Secondary | ICD-10-CM | POA: Diagnosis not present

## 2022-12-12 DIAGNOSIS — E039 Hypothyroidism, unspecified: Secondary | ICD-10-CM | POA: Diagnosis not present

## 2022-12-12 DIAGNOSIS — K219 Gastro-esophageal reflux disease without esophagitis: Secondary | ICD-10-CM | POA: Diagnosis not present

## 2022-12-12 NOTE — Telephone Encounter (Signed)
Pt is not interested in the cardiac rehab at this time. ?Closed referral. ?

## 2022-12-12 NOTE — Telephone Encounter (Signed)
Location of hospitalization: [facility] Reason for hospitalization: chest pain Date of discharge: 12/11/2022 Date of first communication with patient: today Person contacting patient: Me Current symptoms: None Do you understand why you were in the Hospital: Yes Questions regarding discharge instructions: None Where were you discharged to: Home Medications reviewed: Yes Allergies reviewed: Yes Dietary changes reviewed: Yes. Discussed low fat and low salt diet.  Referals reviewed: NA Activities of Daily Living: Able to with mild limitations Any transportation issues/concerns: None Any patient concerns: None Confirmed importance & date/time of Follow up appt: Yes Confirmed with patient if condition begins to worsen call. Pt was given the office number and encouraged to call back with questions or concerns: Yes

## 2022-12-13 ENCOUNTER — Ambulatory Visit: Payer: Medicare Other | Admitting: Cardiology

## 2022-12-18 DIAGNOSIS — F419 Anxiety disorder, unspecified: Secondary | ICD-10-CM | POA: Diagnosis not present

## 2022-12-18 DIAGNOSIS — I214 Non-ST elevation (NSTEMI) myocardial infarction: Secondary | ICD-10-CM | POA: Diagnosis not present

## 2022-12-18 DIAGNOSIS — K219 Gastro-esophageal reflux disease without esophagitis: Secondary | ICD-10-CM | POA: Diagnosis not present

## 2022-12-18 DIAGNOSIS — I2510 Atherosclerotic heart disease of native coronary artery without angina pectoris: Secondary | ICD-10-CM | POA: Diagnosis not present

## 2022-12-18 DIAGNOSIS — Z7902 Long term (current) use of antithrombotics/antiplatelets: Secondary | ICD-10-CM | POA: Diagnosis not present

## 2022-12-18 DIAGNOSIS — I442 Atrioventricular block, complete: Secondary | ICD-10-CM | POA: Diagnosis not present

## 2022-12-18 DIAGNOSIS — G4733 Obstructive sleep apnea (adult) (pediatric): Secondary | ICD-10-CM | POA: Diagnosis not present

## 2022-12-18 DIAGNOSIS — I5032 Chronic diastolic (congestive) heart failure: Secondary | ICD-10-CM | POA: Diagnosis not present

## 2022-12-18 DIAGNOSIS — E876 Hypokalemia: Secondary | ICD-10-CM | POA: Diagnosis not present

## 2022-12-18 DIAGNOSIS — M8580 Other specified disorders of bone density and structure, unspecified site: Secondary | ICD-10-CM | POA: Diagnosis not present

## 2022-12-18 DIAGNOSIS — M1990 Unspecified osteoarthritis, unspecified site: Secondary | ICD-10-CM | POA: Diagnosis not present

## 2022-12-18 DIAGNOSIS — E1169 Type 2 diabetes mellitus with other specified complication: Secondary | ICD-10-CM | POA: Diagnosis not present

## 2022-12-18 DIAGNOSIS — I110 Hypertensive heart disease with heart failure: Secondary | ICD-10-CM | POA: Diagnosis not present

## 2022-12-18 DIAGNOSIS — E039 Hypothyroidism, unspecified: Secondary | ICD-10-CM | POA: Diagnosis not present

## 2022-12-18 DIAGNOSIS — D649 Anemia, unspecified: Secondary | ICD-10-CM | POA: Diagnosis not present

## 2022-12-18 DIAGNOSIS — E782 Mixed hyperlipidemia: Secondary | ICD-10-CM | POA: Diagnosis not present

## 2022-12-20 ENCOUNTER — Ambulatory Visit: Payer: Medicare Other | Admitting: Cardiology

## 2022-12-20 ENCOUNTER — Encounter: Payer: Self-pay | Admitting: Cardiology

## 2022-12-20 VITALS — BP 132/65 | HR 74 | Resp 17 | Ht 61.0 in | Wt 147.2 lb

## 2022-12-20 DIAGNOSIS — I10 Essential (primary) hypertension: Secondary | ICD-10-CM

## 2022-12-20 DIAGNOSIS — I2510 Atherosclerotic heart disease of native coronary artery without angina pectoris: Secondary | ICD-10-CM | POA: Diagnosis not present

## 2022-12-20 DIAGNOSIS — I214 Non-ST elevation (NSTEMI) myocardial infarction: Secondary | ICD-10-CM | POA: Diagnosis not present

## 2022-12-20 DIAGNOSIS — E118 Type 2 diabetes mellitus with unspecified complications: Secondary | ICD-10-CM

## 2022-12-20 DIAGNOSIS — E785 Hyperlipidemia, unspecified: Secondary | ICD-10-CM

## 2022-12-20 DIAGNOSIS — Z951 Presence of aortocoronary bypass graft: Secondary | ICD-10-CM

## 2022-12-20 DIAGNOSIS — G4733 Obstructive sleep apnea (adult) (pediatric): Secondary | ICD-10-CM

## 2022-12-20 DIAGNOSIS — Z955 Presence of coronary angioplasty implant and graft: Secondary | ICD-10-CM

## 2022-12-20 MED ORDER — CLONIDINE HCL 0.1 MG PO TABS
0.1000 mg | ORAL_TABLET | ORAL | 0 refills | Status: DC
Start: 2022-12-20 — End: 2023-01-21

## 2022-12-20 NOTE — Progress Notes (Signed)
ID:  Susan Barnett, DOB 07/22/1946, MRN [id number]  PCP:  Johny Blamer, MD  Cardiologist:  Tessa Lerner, DO, [facility] (established care 11/30/2022) Former Cardiology Providers: Dr. Verdis Prime Sleep medicine provider: Dr. Carolanne Grumbling  Date: 12/20/22 Last Office Visit: 11/30/2022  Chief Complaint  Patient presents with   Transitions Of Care    1 week NSTEMI    HPI  Susan Barnett is a 77 y.o. Caucasian female whose past medical history and cardiovascular risk factors include: CAD status post CABG 2015 (SVG to diagonal and SVG to PDA) and s/p PCI to LCX (11/2022), hypertension, diabetes mellitus type 2, hyperlipidemia, OSA  Patient was referred to the office for evaluation of chest pain given her history of CAD/CABG.  Her symptoms were concerning for angina pectoris and she underwent a left heart catheterization on 12/04/2022 as outlined below.  During her first heart catheterization she had complications requiring transient need for TVP.  She refused to undergo repeat heart catheterization to reevaluate SVG to the diagonal graft.  She was discharged home on 12/05/2022.  She presented to the office status post discharge with worsening chest pain.  She was taken to the hospital urgently SVG to diagonal branch graft was patent and therefore underwent intervention to the native circumflex.  She now presents for follow-up visit.  Post hospital discharge patient is doing well.  She denies anginal chest pain or heart failure symptoms.  No use of sublingual nitroglycerin tablets.  She follows up with her PCP next week for management of diabetes as her antiglycemic agents and insulin's had to be changed to avoid hypoglycemia at the time of discharge.  She is accompanied by her sister who provides collateral history as well  ALLERGIES: Allergies  Allergen Reactions   Asa [Aspirin] Anaphylaxis   Compazine [Prochlorperazine Edisylate] Swelling and Other (See Comments)    Tongue swelling     Nsaids Hives and Swelling   Shellfish Allergy Hives and Swelling   Sulfa Antibiotics Hives and Swelling   Zantac [Ranitidine Hcl] Hives and Swelling   Lisinopril Cough    MEDICATION LIST PRIOR TO VISIT: Current Meds  Medication Sig   acetaminophen (TYLENOL) 650 MG CR tablet Take 1,300 mg by mouth in the morning and at bedtime.   amLODipine (NORVASC) 5 MG tablet Take 5 mg by mouth daily.   atorvastatin (LIPITOR) 40 MG tablet Take 1 tablet (40 mg total) by mouth daily.   CALCIUM PO Take 1 tablet by mouth in the morning and at bedtime.   Cetirizine HCl (ZYRTEC ALLERGY) 10 MG CAPS Take 1 tablet by mouth daily.   Coenzyme Q10 300 MG CAPS Take 300 mg by mouth 2 (two) times daily.   D-Mannose 500 MG CAPS Take 500 mg by mouth in the morning and at bedtime.   empagliflozin (JARDIANCE) 10 MG TABS tablet Take 1 tablet (10 mg total) by mouth daily.   hydrochlorothiazide (MICROZIDE) 12.5 MG capsule Take 12.5 mg by mouth daily.   LANTUS SOLOSTAR 100 UNIT/ML Solostar Pen Inject 10 Units into the skin at bedtime.   levothyroxine (SYNTHROID, LEVOTHROID) 75 MCG tablet Take 75 mcg by mouth daily before breakfast.    losartan (COZAAR) 100 MG tablet Take 1 tablet (100 mg total) by mouth daily.   metoprolol tartrate (LOPRESSOR) 50 MG tablet Take 1 tablet (50 mg total) by mouth 2 (two) times daily.   Multiple Vitamin (MULTIVITAMIN ADULT PO) Take 1 tablet by mouth daily.   nitroGLYCERIN (NITROSTAT) 0.4 MG SL tablet  Place 1 tablet (0.4 mg total) under the tongue every 5 (five) minutes as needed for chest pain.   ONETOUCH VERIO test strip 1 each 2 (two) times daily.   pantoprazole (PROTONIX) 40 MG tablet Take 1 tablet (40 mg total) by mouth daily before breakfast.   Probiotic Product (ALIGN) 4 MG CAPS Take 4 mg by mouth daily.   ticagrelor (BRILINTA) 90 MG TABS tablet Take 1 tablet (90 mg total) by mouth 2 (two) times daily.   [DISCONTINUED] cloNIDine (CATAPRES) 0.1 MG tablet Take 1 tablet (0.1 mg total) by mouth  daily.     PAST MEDICAL HISTORY: Past Medical History:  Diagnosis Date   Anemia    mild   Anginal pain (HCC)    with exertion, last time prior to cardiac caht 06/02/14   Anxiety    situational   Arthritis    Breast cancer (HCC) 2018   Left Breast Cancer   CAD (coronary artery disease)    Dr. Mendel Ryder follows, no problems now   Cancer [facility])    Breast cancer   Complication of anesthesia    Diabetes mellitus, type 2 (HCC)    TYPE 2   Dysrhythmia    occ skips a beat, rate was fast before beta blocker.   Gastric polyp    Genetic testing 04/12/2017   Ms. Lariviere underwent genetic counseling and testing for hereditary cancer syndromes on 02/19/2017. Her results were negative for mutations in all 46 genes analyzed by [employer]'s 46-gene Common Hereditary Cancers Panel. Genes analyzed include: APC, ATM, AXIN2, BARD1, BMPR1A, BRCA1, BRCA2, BRIP1, CDH1, CDKN2A, CHEK2, CTNNA1, DICER1, EPCAM, GREM1, HOXB13, KIT, MEN1, MLH1, MSH2, MSH3, MSH6, MUTYH, NBN   GERD (gastroesophageal reflux disease)    Headache    hx of   Heartburn    Hiatal hernia    mild head tremors   History of radiation therapy 01/30/17- 02/21/17   Left Breast 42.56 Gy in 16 fractions   Hypertension    Hypothyroidism    Occasional tremors    "of head", slight hands   Osteopenia    Personal history of radiation therapy 2018   Left Breast Cancer   Pneumonia    PONV (postoperative nausea and vomiting) 02/24/14   Shortness of breath    with exertion   Sleep apnea    cpap used nightly x 2 yrs now.    PAST SURGICAL HISTORY: Past Surgical History:  Procedure Laterality Date   BREAST BIOPSY Left 1987   BREAST BIOPSY Left 2019   BREAST LUMPECTOMY Left 01/25/2017   BREAST LUMPECTOMY WITH RADIOACTIVE SEED AND SENTINEL LYMPH NODE BIOPSY Left 12/25/2016   Procedure: BREAST LUMPECTOMY WITH RADIOACTIVE SEED AND SENTINEL LYMPH NODE BIOPSY;  Surgeon: Glenna Fellows, MD;  Location: [facility] OR;  Service: General;  Laterality: Left;    CARDIAC CATHETERIZATION  06/02/2014   LAD 30%, D1 80%, CFX 755, RCA > 95% ISR, rx w/ PTCA, heavy calcification, EF 65%   CORONARY ARTERY BYPASS GRAFT N/A 06/14/2014   Procedure: CORONARY ARTERY BYPASS GRAFTING (CABG);  Surgeon: Alleen Borne, MD;  Location: [facility] OR;  Service: Open Heart Surgery;  Laterality: N/A;  Times 2 using endoscopically harvested right saphenous vein.   CORONARY STENT INTERVENTION N/A 12/10/2022   Procedure: CORONARY STENT INTERVENTION;  Surgeon: Elder Negus, MD;  Location: [facility] INVASIVE CV LAB;  Service: Cardiovascular;  Laterality: N/A;   coronary stents     x3 stents placed-prior ablation   CORONARY ULTRASOUND/IVUS N/A 12/10/2022   Procedure:  Coronary Ultrasound/IVUS;  Surgeon: Elder Negus, MD;  Location: [facility] INVASIVE CV LAB;  Service: Cardiovascular;  Laterality: N/A;   DILATION AND CURETTAGE OF UTERUS     ELBOW FRACTURE SURGERY Left    INCONTINENCE SURGERY     sling   KNEE ARTHROSCOPY Right 02/24/2014   Procedure: RIGHT KNEE ARTHROSCOPY WITH DEBRIDEMENT ;  Surgeon: Loanne Drilling, MD;  Location: [facility] ORS;  Service: Orthopedics;  Laterality: Right;   LEFT HEART CATH AND CORS/GRAFTS ANGIOGRAPHY N/A 12/03/2022   Procedure: LEFT HEART CATH AND CORS/GRAFTS ANGIOGRAPHY;  Surgeon: Elder Negus, MD;  Location: [facility] INVASIVE CV LAB;  Service: Cardiovascular;  Laterality: N/A;   LEFT HEART CATH AND CORS/GRAFTS ANGIOGRAPHY N/A 12/10/2022   Procedure: LEFT HEART CATH AND CORS/GRAFTS ANGIOGRAPHY;  Surgeon: Elder Negus, MD;  Location: [facility] INVASIVE CV LAB;  Service: Cardiovascular;  Laterality: N/A;   LEFT HEART CATHETERIZATION WITH CORONARY ANGIOGRAM N/A 06/02/2014   Procedure: LEFT HEART CATHETERIZATION WITH CORONARY ANGIOGRAM;  Surgeon: Lesleigh Noe, MD;  Location: [facility] CATH LAB;  Service: Cardiovascular;  Laterality: N/A;   TEE WITHOUT CARDIOVERSION N/A 06/14/2014   Procedure: TRANSESOPHAGEAL ECHOCARDIOGRAM (TEE);  Surgeon: Alleen Borne, MD;  Location:  [facility] OR;  Service: Open Heart Surgery;  Laterality: N/A;   TEMPORARY PACEMAKER N/A 12/03/2022   Procedure: TEMPORARY PACEMAKER;  Surgeon: Elder Negus, MD;  Location: [facility] INVASIVE CV LAB;  Service: Cardiovascular;  Laterality: N/A;    FAMILY HISTORY: The patient family history includes Breast cancer (age of onset: 30) in her cousin; Diabetes in her mother; Healthy in her brother; Heart attack in her mother; Hodgkin's lymphoma in her maternal uncle; Hypertension in her mother; Lung cancer in her brother; Ulcerative colitis in her daughter.  SOCIAL HISTORY:  The patient  reports that she has never smoked. She has never used smokeless tobacco. She reports that she does not drink alcohol and does not use drugs.  REVIEW OF SYSTEMS: Review of Systems  Cardiovascular:  Negative for chest pain, claudication, dyspnea on exertion, irregular heartbeat, leg swelling, near-syncope, orthopnea, palpitations, paroxysmal nocturnal dyspnea and syncope.  Respiratory:  Negative for shortness of breath.   Hematologic/Lymphatic: Negative for bleeding problem.  Musculoskeletal:  Negative for muscle cramps and myalgias.  Neurological:  Negative for dizziness and light-headedness.    PHYSICAL EXAM:    12/20/2022   11:37 AM 12/11/2022   11:58 AM 12/11/2022    7:35 AM  Vitals with BMI  Height 5\' 1"     Weight 147 lbs 3 oz    BMI 27.83    Systolic 132 140 161  Diastolic 65 64 63  Pulse 74 75 73    Physical Exam  Constitutional: No distress.  Age appropriate, hemodynamically stable.   Neck: No JVD present.  Cardiovascular: Normal rate, regular rhythm, S1 normal, S2 normal, intact distal pulses and normal pulses. Exam reveals no gallop, no S3 and no S4.  No murmur heard. Pulmonary/Chest: Effort normal and breath sounds normal. No stridor. She has no wheezes. She has no rales.  Abdominal: Soft. Bowel sounds are normal. She exhibits no distension. There is no abdominal tenderness.  Musculoskeletal:         General: No edema.     Cervical back: Neck supple.  Neurological: She is alert and oriented to person, place, and time. She has intact cranial nerves (2-12).  Skin: Skin is warm and moist.   CARDIAC DATABASE: EKG: Dec 20, 2022: Normal sinus rhythm, 68 bpm, LAE, consider inferior  infarct, TWI in inferior leads consider ischemia.  Compared to prior EKG first-degree AV block no longer present.   Echocardiogram: 09/01/2018: LVEF 55 to 60%, grade 2 diastolic dysfunction, trivial AR, mild MR, mild left atrial enlargement.  December 03, 2022:  1. Left ventricular ejection fraction, by estimation, is 60 to 65%. The  left ventricle has normal function. There is mild concentric left  ventricular hypertrophy. Left ventricular diastolic parameters are  consistent with Grade II diastolic dysfunction  (pseudonormalization). On the 2-chamber contrast images there appears to  be a small segment of akinesis in the basilar to mid inferior wall. This  is not seen in any other images.   2. Right ventricular systolic function is normal. The right ventricular  size is normal. There is normal pulmonary artery systolic pressure.   3. Left atrial size was mildly dilated.   4. The mitral valve is normal in structure. Mild mitral valve  regurgitation. No evidence of mitral stenosis.   5. The aortic valve is tricuspid. There is mild calcification of the  aortic valve. Aortic valve regurgitation is trivial. No aortic stenosis is  present.   6. The inferior vena cava is normal in size with <50% respiratory  variability, suggesting right atrial pressure of 8 mmHg.   Stress Testing: MPI 05/18/2020  Nuclear stress EF: 72%. No wall motion abnormalities.  There was no ST segment deviation noted during stress.  The study is normal.  This is a low risk study. No evidence of ischemia or infarction. Prior CABG.  Heart Catheterization: December 03, 2022: LM: Normal LAD: Prox-mid 30% diffuse disease, distal 75% diffuse  disease Lcx: Mid 75% diffuse disease RCA: Prox 80% diffuse disease, followed 100 mid ISR SVG-PDA: Patent SVG-diag: Could not be visualized   Temporary transvenous pacemaker placement.   Patient was very confused and kept attempting to sit up.  I did not feel leaving femoral sheath in place was safe.  Therefore, I decided to go ahead and place 5 [place] Mynx closure.   With transient hypotension, patient was very confused and disoriented.  Code stroke was called.  Patient was evaluated by neurology and thought to be less likely to have had stroke.  CT head was ordered out of abundance caution.  Patient is mental alertness improved with time.  She will be kept into heart ICU overnight for monitoring.  Temporary pacemaker will be removed tomorrow morning if no further use is needed.   Unusually prolonged procedure time due to intraprocedural hypotension, complete AV block requiring placement of temporary transvenous placement, disorientation requiring further neurological evaluation.  December 10, 2022: LM: Mild disease LAD: Prox-mid 30% diffuse disease, distal 75% diffuse disease Lcx: Prox 30%, mid 75% diffuse disease, followed by focal 90% stenosis RCA: Not engaged today. Known Prox 80% diffuse disease, followed 100 mid ISR SVG-PDA: Not engaged today. Known patent SVG-diag: Patent. No significant disease   LVEDP 17 mmHg   Successful percutaneous coronary intervention mid LCx        PTCA and stent placement with overlapping stents        2.0 X 18 mm and 2.25 X 15 Onyx drug-eluting stent        Post dilatation with 2.25 mm stent balloon and 2.5X15 mm [place] balloon up to 16 atm    LABORATORY DATA:    Latest Ref Rng & Units 12/11/2022    2:25 AM 12/10/2022    3:45 PM 12/04/2022    4:02 AM  CBC  WBC 4.0 - 10.5  K/uL 14.2  13.3  17.4   Hemoglobin 12.0 - 15.0 g/dL 16.1  09.6  04.5   Hematocrit 36.0 - 46.0 % 36.0  33.7  37.9   Platelets 150 - 400 K/uL 391  405  298        Latest Ref Rng &  Units 12/11/2022    2:25 AM 12/10/2022    3:45 PM 12/04/2022    4:02 AM  CMP  Glucose 70 - 99 mg/dL 409  811  914   BUN 8 - 23 mg/dL 10  11  11    Creatinine 0.44 - 1.00 mg/dL 7.82  9.56  2.13   Sodium 135 - 145 mmol/L 134  138  137   Potassium 3.5 - 5.1 mmol/L 2.8  3.8  3.9   Chloride 98 - 111 mmol/L 101  103  106   CO2 22 - 32 mmol/L 22  23  23    Calcium 8.9 - 10.3 mg/dL 8.7  9.3  8.6     Lipid Panel     Component Value Date/Time   CHOL 114 12/05/2022 1057   TRIG 149 12/05/2022 1057   HDL 39 (L) 12/05/2022 1057   CHOLHDL 2.9 12/05/2022 1057   VLDL 30 12/05/2022 1057   LDLCALC 45 12/05/2022 1057   LDLDIRECT 51 12/05/2022 1057    No components found for: "NTPROBNP" Recent Labs    11/30/22 1154  PROBNP 793*   Recent Labs    12/03/22 0540  TSH 0.381    BMP Recent Labs    12/04/22 0402 12/10/22 1545 12/11/22 0225  NA 137 138 134*  K 3.9 3.8 2.8*  CL 106 103 101  CO2 23 23 22   GLUCOSE 247* 123* 102*  BUN 11 11 10   CREATININE 0.73 0.62 0.65  CALCIUM 8.6* 9.3 8.7*  GFRNONAA >60 >60 >60    HEMOGLOBIN A1C Lab Results  Component Value Date   HGBA1C 8.7 (H) 12/03/2022   MPG 202.99 12/03/2022   External Labs: Collected: 07/26/2022 Hemoglobin A1c 7.6.  Collected: 03/27/2022 Sodium 137, potassium 4.8, chloride 104, bicarb 27. AST 17, ALT 16, alkaline phosphatase 55. BUN 11, creatinine 0.8. Hemoglobin 13.8, hematocrit 39.2%. Total cholesterol 135, triglycerides 160, HDL 38, LDL 65, non-HDL 97. TSH 0.17  IMPRESSION:    ICD-10-CM   1. NSTEMI (non-ST elevated myocardial infarction) (HCC)  I21.4 EKG 12-Lead    2. Coronary artery disease involving native coronary artery of native heart without angina pectoris  I25.10     3. Hx of CABG  Z95.1     4. S/P drug eluting coronary stent placement  Z95.5     5. Essential hypertension  I10 cloNIDine (CATAPRES) 0.1 MG tablet    6. Type 2 diabetes mellitus with complications (HCC)  E11.8     7. Hyperlipidemia LDL  goal <55  E78.5     8. OSA (obstructive sleep apnea)  G47.33        RECOMMENDATIONS: RANATA CLABORN is a 77 y.o. Caucasian female whose past medical history and cardiac risk factors include: CAD status post CABG 2015 (SVG to diagonal and SVG to PDA) and s/p PCI to LCX (11/2022), hypertension, diabetes mellitus type 2, hyperlipidemia, OSA  NSTEMI (non-ST elevated myocardial infarction) (HCC) Coronary artery disease involving native coronary artery of native heart without angina pectoris Hx of CABG S/P drug eluting coronary stent placement Status post PCI to the mid LCx/20 04/2023. Allergic to aspirin. Continue Brilinta for 6 months and transition to Plavix thereafter, per interventional  cardiology recommendations. EKG: Sinus rhythm with inferior T wave inversions consider inferior ischemia. Denies angina pectoris. No use of sublingual nitroglycerin tablets. Back to baseline capacity per patient. Educated her on the importance of improving her modifiable cardiovascular risk factors. Medication changes as discussed below.  Essential hypertension Office blood pressures prior to establishing care were uncontrolled, SBP greater than 170 mmHg. Office blood pressures are now better controlled. Currently on clonidine 0.1 mg p.o. daily.  I have asked her to take it every other day for the next 2 weeks and stop Currently takes hydrochlorothiazide 12.5 mg p.o. Monday Wednesday and Friday, I have asked her to start taking it every day. Check blood pressures at home. Reemphasized importance of low-salt diet.  Type 2 diabetes mellitus with complications (HCC) Antiglycemic agents and insulin had to be titrated down to prevent hypoglycemia. She has an upcoming appointment with Dr. Evlyn Kanner next week and I have encouraged her to have her diabetes management reevaluated. Currently on ARB, statin therapy, Jardianc.  Hyperlipidemia LDL goal <55 Currently on atorvastatin.   She denies myalgia or other  side effects. Most recent lipids dated April 2024, independently reviewed as noted above. LDL currently at goal   FINAL MEDICATION LIST END OF ENCOUNTER: Meds ordered this encounter  Medications   cloNIDine (CATAPRES) 0.1 MG tablet    Sig: Take 1 tablet (0.1 mg total) by mouth every other day for 14 days.    Dispense:  7 tablet    Refill:  0    Medications Discontinued During This Encounter  Medication Reason   cloNIDine (CATAPRES) 0.1 MG tablet Reorder     Current Outpatient Medications:    acetaminophen (TYLENOL) 650 MG CR tablet, Take 1,300 mg by mouth in the morning and at bedtime., Disp: , Rfl:    amLODipine (NORVASC) 5 MG tablet, Take 5 mg by mouth daily., Disp: , Rfl:    atorvastatin (LIPITOR) 40 MG tablet, Take 1 tablet (40 mg total) by mouth daily., Disp: 30 tablet, Rfl: 2   CALCIUM PO, Take 1 tablet by mouth in the morning and at bedtime., Disp: , Rfl:    Cetirizine HCl (ZYRTEC ALLERGY) 10 MG CAPS, Take 1 tablet by mouth daily., Disp: , Rfl:    Coenzyme Q10 300 MG CAPS, Take 300 mg by mouth 2 (two) times daily., Disp: , Rfl:    D-Mannose 500 MG CAPS, Take 500 mg by mouth in the morning and at bedtime., Disp: , Rfl:    empagliflozin (JARDIANCE) 10 MG TABS tablet, Take 1 tablet (10 mg total) by mouth daily., Disp: 30 tablet, Rfl: 1   hydrochlorothiazide (MICROZIDE) 12.5 MG capsule, Take 12.5 mg by mouth daily., Disp: , Rfl:    LANTUS SOLOSTAR 100 UNIT/ML Solostar Pen, Inject 10 Units into the skin at bedtime., Disp: 15 mL, Rfl: 0   levothyroxine (SYNTHROID, LEVOTHROID) 75 MCG tablet, Take 75 mcg by mouth daily before breakfast. , Disp: , Rfl:    losartan (COZAAR) 100 MG tablet, Take 1 tablet (100 mg total) by mouth daily., Disp: 90 tablet, Rfl: 3   metoprolol tartrate (LOPRESSOR) 50 MG tablet, Take 1 tablet (50 mg total) by mouth 2 (two) times daily., Disp: 30 tablet, Rfl: 1   Multiple Vitamin (MULTIVITAMIN ADULT PO), Take 1 tablet by mouth daily., Disp: , Rfl:     nitroGLYCERIN (NITROSTAT) 0.4 MG SL tablet, Place 1 tablet (0.4 mg total) under the tongue every 5 (five) minutes as needed for chest pain., Disp: 25 tablet, Rfl: 1  ONETOUCH VERIO test strip, 1 each 2 (two) times daily., Disp: , Rfl:    pantoprazole (PROTONIX) 40 MG tablet, Take 1 tablet (40 mg total) by mouth daily before breakfast., Disp: 90 tablet, Rfl: 3   Probiotic Product (ALIGN) 4 MG CAPS, Take 4 mg by mouth daily., Disp: , Rfl:    ticagrelor (BRILINTA) 90 MG TABS tablet, Take 1 tablet (90 mg total) by mouth 2 (two) times daily., Disp: 60 tablet, Rfl: 2   cloNIDine (CATAPRES) 0.1 MG tablet, Take 1 tablet (0.1 mg total) by mouth every other day for 14 days., Disp: 7 tablet, Rfl: 0  Orders Placed This Encounter  Procedures   EKG 12-Lead    There are no Patient Instructions on file for this visit.   --Continue cardiac medications as reconciled in final medication list. --Return in about 4 weeks (around 01/17/2023) for Follow up, CAD. or sooner if needed. --Continue follow-up with your primary care physician regarding the management of your other chronic comorbid conditions.  Patient's questions and concerns were addressed to her satisfaction. She voices understanding of the instructions provided during this encounter.   This note was created using a voice recognition software as a result there may be grammatical errors inadvertently enclosed that do not reflect the nature of this encounter. Every attempt is made to correct such errors.  Tessa Lerner, [place], [facility]  Pager:  [phone] Office: [phone]

## 2022-12-22 DIAGNOSIS — I442 Atrioventricular block, complete: Secondary | ICD-10-CM | POA: Diagnosis not present

## 2022-12-22 DIAGNOSIS — F419 Anxiety disorder, unspecified: Secondary | ICD-10-CM | POA: Diagnosis not present

## 2022-12-22 DIAGNOSIS — Z7902 Long term (current) use of antithrombotics/antiplatelets: Secondary | ICD-10-CM | POA: Diagnosis not present

## 2022-12-22 DIAGNOSIS — I214 Non-ST elevation (NSTEMI) myocardial infarction: Secondary | ICD-10-CM | POA: Diagnosis not present

## 2022-12-22 DIAGNOSIS — I2510 Atherosclerotic heart disease of native coronary artery without angina pectoris: Secondary | ICD-10-CM | POA: Diagnosis not present

## 2022-12-22 DIAGNOSIS — E1169 Type 2 diabetes mellitus with other specified complication: Secondary | ICD-10-CM | POA: Diagnosis not present

## 2022-12-22 DIAGNOSIS — G4733 Obstructive sleep apnea (adult) (pediatric): Secondary | ICD-10-CM | POA: Diagnosis not present

## 2022-12-22 DIAGNOSIS — M1990 Unspecified osteoarthritis, unspecified site: Secondary | ICD-10-CM | POA: Diagnosis not present

## 2022-12-22 DIAGNOSIS — I5032 Chronic diastolic (congestive) heart failure: Secondary | ICD-10-CM | POA: Diagnosis not present

## 2022-12-22 DIAGNOSIS — I110 Hypertensive heart disease with heart failure: Secondary | ICD-10-CM | POA: Diagnosis not present

## 2022-12-22 DIAGNOSIS — E876 Hypokalemia: Secondary | ICD-10-CM | POA: Diagnosis not present

## 2022-12-22 DIAGNOSIS — E782 Mixed hyperlipidemia: Secondary | ICD-10-CM | POA: Diagnosis not present

## 2022-12-22 DIAGNOSIS — E039 Hypothyroidism, unspecified: Secondary | ICD-10-CM | POA: Diagnosis not present

## 2022-12-22 DIAGNOSIS — M8580 Other specified disorders of bone density and structure, unspecified site: Secondary | ICD-10-CM | POA: Diagnosis not present

## 2022-12-22 DIAGNOSIS — K219 Gastro-esophageal reflux disease without esophagitis: Secondary | ICD-10-CM | POA: Diagnosis not present

## 2022-12-22 DIAGNOSIS — D649 Anemia, unspecified: Secondary | ICD-10-CM | POA: Diagnosis not present

## 2022-12-23 ENCOUNTER — Emergency Department (HOSPITAL_COMMUNITY): Payer: Medicare Other

## 2022-12-23 ENCOUNTER — Inpatient Hospital Stay (HOSPITAL_COMMUNITY)
Admission: EM | Admit: 2022-12-23 | Discharge: 2022-12-25 | DRG: 281 | Disposition: A | Discharge status: Home Health | Payer: Medicare Other | Attending: Cardiology | Admitting: Cardiology

## 2022-12-23 ENCOUNTER — Other Ambulatory Visit: Payer: Self-pay

## 2022-12-23 ENCOUNTER — Encounter (HOSPITAL_COMMUNITY): Payer: Self-pay

## 2022-12-23 DIAGNOSIS — Z79890 Hormone replacement therapy (postmenopausal): Secondary | ICD-10-CM

## 2022-12-23 DIAGNOSIS — K219 Gastro-esophageal reflux disease without esophagitis: Secondary | ICD-10-CM | POA: Diagnosis present

## 2022-12-23 DIAGNOSIS — Z8249 Family history of ischemic heart disease and other diseases of the circulatory system: Secondary | ICD-10-CM | POA: Diagnosis not present

## 2022-12-23 DIAGNOSIS — I25118 Atherosclerotic heart disease of native coronary artery with other forms of angina pectoris: Secondary | ICD-10-CM

## 2022-12-23 DIAGNOSIS — I959 Hypotension, unspecified: Secondary | ICD-10-CM | POA: Diagnosis not present

## 2022-12-23 DIAGNOSIS — Z91013 Allergy to seafood: Secondary | ICD-10-CM

## 2022-12-23 DIAGNOSIS — E119 Type 2 diabetes mellitus without complications: Secondary | ICD-10-CM | POA: Diagnosis present

## 2022-12-23 DIAGNOSIS — Z794 Long term (current) use of insulin: Secondary | ICD-10-CM

## 2022-12-23 DIAGNOSIS — Z79899 Other long term (current) drug therapy: Secondary | ICD-10-CM

## 2022-12-23 DIAGNOSIS — I2510 Atherosclerotic heart disease of native coronary artery without angina pectoris: Secondary | ICD-10-CM | POA: Diagnosis not present

## 2022-12-23 DIAGNOSIS — Z951 Presence of aortocoronary bypass graft: Secondary | ICD-10-CM | POA: Diagnosis not present

## 2022-12-23 DIAGNOSIS — G4733 Obstructive sleep apnea (adult) (pediatric): Secondary | ICD-10-CM | POA: Diagnosis not present

## 2022-12-23 DIAGNOSIS — Z886 Allergy status to analgesic agent status: Secondary | ICD-10-CM

## 2022-12-23 DIAGNOSIS — R0789 Other chest pain: Secondary | ICD-10-CM | POA: Diagnosis not present

## 2022-12-23 DIAGNOSIS — R739 Hyperglycemia, unspecified: Secondary | ICD-10-CM | POA: Diagnosis not present

## 2022-12-23 DIAGNOSIS — R0602 Shortness of breath: Secondary | ICD-10-CM | POA: Diagnosis not present

## 2022-12-23 DIAGNOSIS — Z801 Family history of malignant neoplasm of trachea, bronchus and lung: Secondary | ICD-10-CM

## 2022-12-23 DIAGNOSIS — I5032 Chronic diastolic (congestive) heart failure: Secondary | ICD-10-CM

## 2022-12-23 DIAGNOSIS — Z955 Presence of coronary angioplasty implant and graft: Secondary | ICD-10-CM

## 2022-12-23 DIAGNOSIS — Z833 Family history of diabetes mellitus: Secondary | ICD-10-CM

## 2022-12-23 DIAGNOSIS — E1165 Type 2 diabetes mellitus with hyperglycemia: Secondary | ICD-10-CM

## 2022-12-23 DIAGNOSIS — Z882 Allergy status to sulfonamides status: Secondary | ICD-10-CM

## 2022-12-23 DIAGNOSIS — R079 Chest pain, unspecified: Secondary | ICD-10-CM | POA: Diagnosis not present

## 2022-12-23 DIAGNOSIS — Z853 Personal history of malignant neoplasm of breast: Secondary | ICD-10-CM | POA: Diagnosis not present

## 2022-12-23 DIAGNOSIS — Z803 Family history of malignant neoplasm of breast: Secondary | ICD-10-CM

## 2022-12-23 DIAGNOSIS — Z807 Family history of other malignant neoplasms of lymphoid, hematopoietic and related tissues: Secondary | ICD-10-CM

## 2022-12-23 DIAGNOSIS — I110 Hypertensive heart disease with heart failure: Secondary | ICD-10-CM | POA: Diagnosis present

## 2022-12-23 DIAGNOSIS — G4730 Sleep apnea, unspecified: Secondary | ICD-10-CM | POA: Diagnosis present

## 2022-12-23 DIAGNOSIS — E782 Mixed hyperlipidemia: Secondary | ICD-10-CM | POA: Diagnosis present

## 2022-12-23 DIAGNOSIS — Z888 Allergy status to other drugs, medicaments and biological substances status: Secondary | ICD-10-CM | POA: Diagnosis not present

## 2022-12-23 DIAGNOSIS — R7989 Other specified abnormal findings of blood chemistry: Secondary | ICD-10-CM

## 2022-12-23 DIAGNOSIS — Z7984 Long term (current) use of oral hypoglycemic drugs: Secondary | ICD-10-CM

## 2022-12-23 DIAGNOSIS — I10 Essential (primary) hypertension: Secondary | ICD-10-CM

## 2022-12-23 DIAGNOSIS — E039 Hypothyroidism, unspecified: Secondary | ICD-10-CM | POA: Diagnosis not present

## 2022-12-23 DIAGNOSIS — I214 Non-ST elevation (NSTEMI) myocardial infarction: Principal | ICD-10-CM | POA: Diagnosis present

## 2022-12-23 DIAGNOSIS — R778 Other specified abnormalities of plasma proteins: Secondary | ICD-10-CM | POA: Diagnosis not present

## 2022-12-23 DIAGNOSIS — Z923 Personal history of irradiation: Secondary | ICD-10-CM

## 2022-12-23 DIAGNOSIS — R231 Pallor: Secondary | ICD-10-CM | POA: Diagnosis not present

## 2022-12-23 DIAGNOSIS — Z7902 Long term (current) use of antithrombotics/antiplatelets: Secondary | ICD-10-CM

## 2022-12-23 DIAGNOSIS — I340 Nonrheumatic mitral (valve) insufficiency: Secondary | ICD-10-CM | POA: Diagnosis not present

## 2022-12-23 LAB — BASIC METABOLIC PANEL
Anion gap: 12 (ref 5–15)
BUN: 16 mg/dL (ref 8–23)
CO2: 22 mmol/L (ref 22–32)
Calcium: 9.8 mg/dL (ref 8.9–10.3)
Chloride: 100 mmol/L (ref 98–111)
Creatinine, Ser: 1.02 mg/dL — ABNORMAL HIGH (ref 0.44–1.00)
GFR, Estimated: 57 mL/min — ABNORMAL LOW (ref >60)
Glucose, Bld: 250 mg/dL — ABNORMAL HIGH (ref 70–99)
Potassium: 3.7 mmol/L (ref 3.5–5.1)
Sodium: 134 mmol/L — ABNORMAL LOW (ref 135–145)

## 2022-12-23 LAB — CBC WITH DIFFERENTIAL/PLATELET
Abs Immature Granulocytes: 0.08 10*3/uL — ABNORMAL HIGH (ref 0.00–0.07)
Basophils Absolute: 0.1 10*3/uL (ref 0.0–0.1)
Basophils Relative: 1 %
Eosinophils Absolute: 0.4 10*3/uL (ref 0.0–0.5)
Eosinophils Relative: 3 %
HCT: 39.8 % (ref 36.0–46.0)
Hemoglobin: 12.7 g/dL (ref 12.0–15.0)
Immature Granulocytes: 1 %
Lymphocytes Relative: 16 %
Lymphs Abs: 2.2 10*3/uL (ref 0.7–4.0)
MCH: 28.6 pg (ref 26.0–34.0)
MCHC: 31.9 g/dL (ref 30.0–36.0)
MCV: 89.6 fL (ref 80.0–100.0)
Monocytes Absolute: 0.7 10*3/uL (ref 0.1–1.0)
Monocytes Relative: 5 %
Neutro Abs: 9.8 10*3/uL — ABNORMAL HIGH (ref 1.7–7.7)
Neutrophils Relative %: 74 %
Platelets: 427 10*3/uL — ABNORMAL HIGH (ref 150–400)
RBC: 4.44 MIL/uL (ref 3.87–5.11)
RDW: 13.5 % (ref 11.5–15.5)
WBC: 13.3 10*3/uL — ABNORMAL HIGH (ref 4.0–10.5)
nRBC: 0 % (ref 0.0–0.2)

## 2022-12-23 LAB — TROPONIN I (HIGH SENSITIVITY)
Troponin I (High Sensitivity): 210 ng/L (ref <18)
Troponin I (High Sensitivity): 2608 ng/L (ref <18)
Troponin I (High Sensitivity): 6307 ng/L (ref <18)
Troponin I (High Sensitivity): 6570 ng/L (ref <18)
Troponin I (High Sensitivity): 4621 ng/L (ref <18)

## 2022-12-23 LAB — GLUCOSE, CAPILLARY
Glucose-Capillary: 237 mg/dL — ABNORMAL HIGH (ref 70–99)
Glucose-Capillary: 203 mg/dL — ABNORMAL HIGH (ref 70–99)

## 2022-12-23 LAB — D-DIMER, QUANTITATIVE: D-Dimer, Quant: 0.39 ug/mL-FEU (ref 0.00–0.50)

## 2022-12-23 LAB — BRAIN NATRIURETIC PEPTIDE: B Natriuretic Peptide: 129.6 pg/mL — ABNORMAL HIGH (ref 0.0–100.0)

## 2022-12-23 LAB — HEPARIN LEVEL (UNFRACTIONATED): Heparin Unfractionated: 0.46 IU/mL (ref 0.30–0.70)

## 2022-12-23 MED ORDER — LEVOTHYROXINE SODIUM 75 MCG PO TABS
75.0000 ug | ORAL_TABLET | Freq: Every day | ORAL | Status: DC
  Administered 2022-12-24 – 2022-12-25 (×2): 75 ug via ORAL
  Filled 2022-12-23 (×2): qty 1

## 2022-12-23 MED ORDER — CLONIDINE HCL 0.1 MG PO TABS
0.1000 mg | ORAL_TABLET | ORAL | Status: DC
  Administered 2022-12-24: 0.1 mg via ORAL
  Filled 2022-12-23: qty 1

## 2022-12-23 MED ORDER — ALIGN 4 MG PO CAPS: 4.0000 mg | ORAL_CAPSULE | Freq: Every day | ORAL | Status: DC

## 2022-12-23 MED ORDER — HEPARIN (PORCINE) 25000 UT/250ML-% IV SOLN
750.0000 [IU]/h | INTRAVENOUS | Status: DC
  Administered 2022-12-23 – 2022-12-24 (×2): 750 [IU]/h via INTRAVENOUS
  Filled 2022-12-23 (×2): qty 250

## 2022-12-23 MED ORDER — INSULIN GLARGINE-YFGN 100 UNIT/ML ~~LOC~~ SOLN
40.0000 [IU] | Freq: Every day | SUBCUTANEOUS | Status: DC
  Administered 2022-12-23 – 2022-12-24 (×2): 40 [IU] via SUBCUTANEOUS
  Filled 2022-12-23 (×3): qty 0.4

## 2022-12-23 MED ORDER — D-MANNOSE 500 MG PO CAPS: 500.0000 mg | ORAL_CAPSULE | Freq: Every day | ORAL | Status: DC

## 2022-12-23 MED ORDER — ACETAMINOPHEN 500 MG PO TABS
1000.0000 mg | ORAL_TABLET | Freq: Every day | ORAL | Status: DC
  Administered 2022-12-23 – 2022-12-24 (×2): 1000 mg via ORAL
  Filled 2022-12-23 (×2): qty 2

## 2022-12-23 MED ORDER — HEPARIN BOLUS VIA INFUSION
3600.0000 [IU] | Freq: Once | INTRAVENOUS | Status: AC
  Administered 2022-12-23: 3600 [IU] via INTRAVENOUS
  Filled 2022-12-23: qty 3600

## 2022-12-23 MED ORDER — COENZYME Q10 300 MG PO CAPS: 300.0000 mg | ORAL_CAPSULE | Freq: Two times a day (BID) | ORAL | Status: DC

## 2022-12-23 MED ORDER — ONDANSETRON HCL 4 MG/2ML IJ SOLN: 4.0000 mg | Freq: Four times a day (QID) | INTRAMUSCULAR | Status: DC | PRN

## 2022-12-23 MED ORDER — NITROGLYCERIN 0.4 MG SL SUBL: 0.4000 mg | SUBLINGUAL_TABLET | SUBLINGUAL | Status: DC | PRN

## 2022-12-23 MED ORDER — INSULIN ASPART 100 UNIT/ML IJ SOLN
0.0000 [IU] | Freq: Three times a day (TID) | INTRAMUSCULAR | Status: DC
  Administered 2022-12-24 – 2022-12-25 (×3): 2 [IU] via SUBCUTANEOUS

## 2022-12-23 MED ORDER — ACETAMINOPHEN ER 650 MG PO TBCR: 1300.0000 mg | EXTENDED_RELEASE_TABLET | Freq: Every day | ORAL | Status: DC

## 2022-12-23 MED ORDER — EMPAGLIFLOZIN 10 MG PO TABS
10.0000 mg | ORAL_TABLET | Freq: Every day | ORAL | Status: DC
  Administered 2022-12-24 – 2022-12-25 (×2): 10 mg via ORAL
  Filled 2022-12-23 (×2): qty 1

## 2022-12-23 MED ORDER — FUROSEMIDE 10 MG/ML IJ SOLN
20.0000 mg | Freq: Every day | INTRAMUSCULAR | Status: DC
  Administered 2022-12-25: 20 mg via INTRAVENOUS
  Filled 2022-12-23: qty 2

## 2022-12-23 MED ORDER — ATORVASTATIN CALCIUM 40 MG PO TABS
40.0000 mg | ORAL_TABLET | Freq: Every day | ORAL | Status: DC
  Administered 2022-12-23 – 2022-12-25 (×3): 40 mg via ORAL
  Filled 2022-12-23 (×3): qty 1

## 2022-12-23 MED ORDER — LOSARTAN POTASSIUM 50 MG PO TABS
100.0000 mg | ORAL_TABLET | Freq: Every day | ORAL | Status: DC
  Administered 2022-12-23 – 2022-12-25 (×2): 100 mg via ORAL
  Filled 2022-12-23 (×3): qty 2

## 2022-12-23 MED ORDER — ACETAMINOPHEN 325 MG PO TABS
650.0000 mg | ORAL_TABLET | ORAL | Status: DC | PRN
  Administered 2022-12-24: 650 mg via ORAL
  Filled 2022-12-23: qty 2

## 2022-12-23 MED ORDER — ADULT MULTIVITAMIN W/MINERALS CH
1.0000 | ORAL_TABLET | Freq: Every day | ORAL | Status: DC
  Administered 2022-12-23 – 2022-12-25 (×2): 1 via ORAL
  Filled 2022-12-23 (×2): qty 1

## 2022-12-23 MED ORDER — CLOPIDOGREL BISULFATE 75 MG PO TABS
75.0000 mg | ORAL_TABLET | Freq: Every day | ORAL | Status: DC
  Administered 2022-12-24 – 2022-12-25 (×2): 75 mg via ORAL
  Filled 2022-12-23 (×2): qty 1

## 2022-12-23 MED ORDER — RANOLAZINE ER 500 MG PO TB12
500.0000 mg | ORAL_TABLET | Freq: Two times a day (BID) | ORAL | Status: DC
  Administered 2022-12-23 – 2022-12-25 (×5): 500 mg via ORAL
  Filled 2022-12-23 (×5): qty 1

## 2022-12-23 MED ORDER — CLOPIDOGREL BISULFATE 300 MG PO TABS
300.0000 mg | ORAL_TABLET | Freq: Once | ORAL | Status: AC
  Administered 2022-12-23: 300 mg via ORAL
  Filled 2022-12-23: qty 1

## 2022-12-23 MED ORDER — CILOSTAZOL 50 MG PO TABS
50.0000 mg | ORAL_TABLET | Freq: Two times a day (BID) | ORAL | Status: DC
  Administered 2022-12-23 – 2022-12-25 (×5): 50 mg via ORAL
  Filled 2022-12-23 (×7): qty 1

## 2022-12-23 MED ORDER — METOPROLOL TARTRATE 25 MG PO TABS
25.0000 mg | ORAL_TABLET | Freq: Two times a day (BID) | ORAL | Status: DC
  Administered 2022-12-23 – 2022-12-25 (×4): 25 mg via ORAL
  Filled 2022-12-23 (×4): qty 1

## 2022-12-23 MED ORDER — LORATADINE 10 MG PO TABS
10.0000 mg | ORAL_TABLET | Freq: Every day | ORAL | Status: DC
  Administered 2022-12-23 – 2022-12-25 (×3): 10 mg via ORAL
  Filled 2022-12-23 (×3): qty 1

## 2022-12-23 MED ORDER — PANTOPRAZOLE SODIUM 40 MG PO TBEC
40.0000 mg | DELAYED_RELEASE_TABLET | Freq: Every day | ORAL | Status: DC
  Administered 2022-12-23 – 2022-12-25 (×3): 40 mg via ORAL
  Filled 2022-12-23 (×3): qty 1

## 2022-12-23 NOTE — ED Triage Notes (Signed)
Pt felt more tired than usual yesterday. At 0300 today pt woke up with chest pressure and SOB radiating to back. Took 3 nitro that improved CP but. PT HAD nstemi 2 WEEKS AGO WITH 2 stents placed. When EMS arrived bp 100/50 and pt was clammy and pale. Pt received 4 morphine and 4 zofran with ems. When pt arrived 2/10. NO ASA GIVEN BC PT HAS ALLERGY. PT IS ON BLOOD THINNER

## 2022-12-23 NOTE — ED Provider Notes (Signed)
Assumed care pt with high risk of ACS with hx and recent stents in the LCX.  Coming in today with hx of new chest pain that woke her from sleep.  Has had NTG without much help and also morphine with significant improvement.  Labs are reassuring except trop of 200.  Repeat today is 2000.  Spoke with Dr. Odis Hollingshead.  Pt started on a heparin gtt per pharmacy  Pt remains pain free at this time.  Repeat EKG unchanged.  CRITICAL CARE Performed by: Nedda Gains Total critical care time: 30 minutes Critical care time was exclusive of separately billable procedures and treating other patients. Critical care was necessary to treat or prevent imminent or life-threatening deterioration. Critical care was time spent personally by me on the following activities: development of treatment plan with patient and/or surrogate as well as nursing, discussions with consultants, evaluation of patient's response to treatment, examination of patient, obtaining history from patient or surrogate, ordering and performing treatments and interventions, ordering and review of laboratory studies, ordering and review of radiographic studies, pulse oximetry and re-evaluation of patient's condition.      Gwyneth Sprout, MD 12/23/22 1530

## 2022-12-23 NOTE — ED Notes (Signed)
This RN assumed care of patient and received off going transfer of care report from off going RN. Pt presented to the ED with chest pressure and SOB from home. Pt has history of NSTEMI. Pt is resting on gurney at this time, respirations are spontaneous, even, unlabored and symmetrical bilaterally. Pt skin is pale dry and warm. Pt connected to CCM, pulse ox and BP. Call light within reach.

## 2022-12-23 NOTE — ED Provider Notes (Signed)
[place] EMERGENCY DEPARTMENT AT [facility] Provider Note   CSN: [id number] Arrival date & time: 12/23/22  0457     History  Chief Complaint  Patient presents with   Shortness of Breath    Susan Barnett is a 77 y.o. female.  Patient presents to the emergency department for evaluation of chest pain and shortness of breath.  Patient reports that she was awakened from sleep by discomfort across the upper chest and into the upper back and nape of neck.  Patient took multiple nitroglycerin at home without any relief.  Patient was given morphine by EMS during transport and pain is nearly completely gone now.  Breathing is improved as well.       Home Medications Prior to Admission medications   Medication Sig Start Date End Date Taking? Authorizing Provider  acetaminophen (TYLENOL) 650 MG CR tablet Take 1,300 mg by mouth at bedtime.   Yes [provider]  amLODipine (NORVASC) 5 MG tablet Take 5 mg by mouth daily.   Yes [provider]  atorvastatin (LIPITOR) 40 MG tablet Take 1 tablet (40 mg total) by mouth daily. 12/06/22  Yes Willeen Niece, MD  CALCIUM PO Take 1 tablet by mouth in the morning and at bedtime.   Yes [provider]  Cetirizine HCl (ZYRTEC ALLERGY) 10 MG CAPS Take 1 tablet by mouth daily. 07/06/19  Yes [provider]  cloNIDine (CATAPRES) 0.1 MG tablet Take 1 tablet (0.1 mg total) by mouth every other day for 14 days. 12/20/22 01/03/23 Yes Tolia, Sunit, DO  Coenzyme Q10 300 MG CAPS Take 300 mg by mouth 2 (two) times daily.   Yes [provider]  D-Mannose 500 MG CAPS Take 500 mg by mouth in the morning and at bedtime.   Yes [provider]  empagliflozin (JARDIANCE) 10 MG TABS tablet Take 1 tablet (10 mg total) by mouth daily. 12/06/22  Yes Willeen Niece, MD  hydrochlorothiazide (MICROZIDE) 12.5 MG capsule Take 12.5 mg by mouth daily. 12/03/22  Yes [provider]  LANTUS SOLOSTAR 100 UNIT/ML  Solostar Pen Inject 10 Units into the skin at bedtime. Patient taking differently: Inject 40 Units into the skin at bedtime. 12/11/22  Yes Patwardhan, Manish J, MD  levothyroxine (SYNTHROID, LEVOTHROID) 75 MCG tablet Take 75 mcg by mouth daily before breakfast.    Yes [provider]  losartan (COZAAR) 100 MG tablet Take 1 tablet (100 mg total) by mouth daily. 10/28/19  Yes Lyn Records, MD  metoprolol tartrate (LOPRESSOR) 50 MG tablet Take 1 tablet (50 mg total) by mouth 2 (two) times daily. 12/05/22  Yes Willeen Niece, MD  Multiple Vitamin (MULTIVITAMIN ADULT PO) Take 1 tablet by mouth daily.   Yes [provider]  nitroGLYCERIN (NITROSTAT) 0.4 MG SL tablet Place 1 tablet (0.4 mg total) under the tongue every 5 (five) minutes as needed for chest pain. 11/30/22 02/28/23 Yes Tolia, Sunit, DO  pantoprazole (PROTONIX) 40 MG tablet Take 1 tablet (40 mg total) by mouth daily before breakfast. 09/08/14  Yes Lyn Records, MD  Probiotic Product (ALIGN) 4 MG CAPS Take 4 mg by mouth daily.   Yes [provider]  ticagrelor (BRILINTA) 90 MG TABS tablet Take 1 tablet (90 mg total) by mouth 2 (two) times daily. 12/11/22  Yes Patwardhan, Anabel Bene, MD  ONETOUCH VERIO test strip 1 each 2 (two) times daily. 04/18/20   [provider]      Allergies    Jonne Ply [  aspirin], Compazine [prochlorperazine edisylate], Nsaids, Shellfish allergy, Sulfa antibiotics, Zantac [ranitidine hcl], and Lisinopril    Review of Systems   Review of Systems  Physical Exam Updated Vital Signs BP (!) 127/58   Pulse (!) 58   Temp 97.7 F (36.5 C) (Oral)   Resp 14   Ht 5\' 1"  (1.549 m)   Wt 65.3 kg   SpO2 100%   BMI 27.21 kg/m  Physical Exam Vitals and nursing note reviewed.  Constitutional:      General: She is not in acute distress.    Appearance: She is well-developed.  HENT:     Head: Normocephalic and atraumatic.     Mouth/Throat:     Mouth: Mucous membranes are moist.  Eyes:      General: Vision grossly intact. Gaze aligned appropriately.     Extraocular Movements: Extraocular movements intact.     Conjunctiva/sclera: Conjunctivae normal.  Cardiovascular:     Rate and Rhythm: Normal rate and regular rhythm.     Pulses: Normal pulses.     Heart sounds: Normal heart sounds, S1 normal and S2 normal. No murmur heard.    No friction rub. No gallop.  Pulmonary:     Effort: Pulmonary effort is normal. No respiratory distress.     Breath sounds: Normal breath sounds.  Abdominal:     General: Bowel sounds are normal.     Palpations: Abdomen is soft.     Tenderness: There is no abdominal tenderness. There is no guarding or rebound.     Hernia: No hernia is present.  Musculoskeletal:        General: No swelling.     Cervical back: Full passive range of motion without pain, normal range of motion and neck supple. No spinous process tenderness or muscular tenderness. Normal range of motion.     Right lower leg: No edema.     Left lower leg: No edema.  Skin:    General: Skin is warm and dry.     Capillary Refill: Capillary refill takes less than 2 seconds.     Findings: No ecchymosis, erythema, rash or wound.  Neurological:     General: No focal deficit present.     Mental Status: She is alert and oriented to person, place, and time.     GCS: GCS eye subscore is 4. GCS verbal subscore is 5. GCS motor subscore is 6.     Cranial Nerves: Cranial nerves 2-12 are intact.     Sensory: Sensation is intact.     Motor: Motor function is intact.     Coordination: Coordination is intact.  Psychiatric:        Attention and Perception: Attention normal.        Mood and Affect: Mood normal.        Speech: Speech normal.        Behavior: Behavior normal.     ED Results / Procedures / Treatments   Labs (all labs ordered are listed, but only abnormal results are displayed) Labs Reviewed  CBC WITH DIFFERENTIAL/PLATELET - Abnormal; Notable for the following components:       Result Value   WBC 13.3 (*)    Platelets 427 (*)    Neutro Abs 9.8 (*)    Abs Immature Granulocytes 0.08 (*)    All other components within normal limits  BASIC METABOLIC PANEL - Abnormal; Notable for the following components:   Sodium 134 (*)    Glucose, Bld 250 (*)    Creatinine,  Ser 1.02 (*)    GFR, Estimated 57 (*)    All other components within normal limits  TROPONIN I (HIGH SENSITIVITY) - Abnormal; Notable for the following components:   Troponin I (High Sensitivity) 210 (*)    All other components within normal limits  D-DIMER, QUANTITATIVE  BRAIN NATRIURETIC PEPTIDE  TROPONIN I (HIGH SENSITIVITY)    EKG EKG Interpretation  Date/Time:  Sunday Dec 23 2022 05:05:04 EDT Ventricular Rate:  57 PR Interval:  148 QRS Duration: 101 QT Interval:  455 QTC Calculation: 443 R Axis:   -1 Text Interpretation: Sinus rhythm Inferior infarct, age indeterminate Confirmed by Gilda Crease [phone]) on 12/23/2022 5:16:20 AM  Radiology DG Chest Port 1 View  Result Date: 12/23/2022 CLINICAL DATA:  77 year old female with history of shortness of breath. EXAM: PORTABLE CHEST 1 VIEW COMPARISON:  Chest x-ray 12/10/2022. FINDINGS: Lung volumes are normal. No consolidative airspace disease. No pleural effusions. No pneumothorax. No pulmonary nodule or mass noted. Pulmonary vasculature and the cardiomediastinal silhouette are within normal limits. Atherosclerosis in the thoracic aorta. Status post median sternotomy for CABG. IMPRESSION: 1.  No radiographic evidence of acute cardiopulmonary disease. 2. Aortic atherosclerosis. Electronically Signed   By: Trudie Reed M.D.   On: 12/23/2022 05:45    Procedures Procedures    Medications Ordered in ED Medications - No data to display  ED Course/ Medical Decision Making/ A&P                             Medical Decision Making Amount and/or Complexity of Data Reviewed External Data Reviewed: labs, radiology, ECG and notes. Labs:  ordered. Decision-making details documented in ED Course. Radiology: ordered and independent interpretation performed. Decision-making details documented in ED Course. ECG/medicine tests: ordered and independent interpretation performed. Decision-making details documented in ED Course.   Differential Diagnosis considered includes, but not limited to: STEMI; NSTEMI; myocarditis; pericarditis; pulmonary embolism; aortic dissection; pneumothorax; pneumonia; gastritis; musculoskeletal pain  Patient presents to the emergency department for evaluation of chest pain and shortness of breath.  Patient with history of CAD.  Patient had an NSTEMI with stenting of left circumflex 2 weeks ago.  The symptoms she is experiencing today are the same as what she had with her NSTEMI.  EKG without obvious ischemia or infarct.  First troponin elevated above 200.  Her troponins, however, 2 weeks ago were over thousand.  Unclear if this is residual from prior NSTEMI.  Will need a second troponin.  Likely will need cardiology input at that time for disposition.  Will sign out to oncoming ER physician.        Final Clinical Impression(s) / ED Diagnoses Final diagnoses:  Chest pain, unspecified type    Rx / DC Orders ED Discharge Orders     None         Gilda Crease, MD 12/23/22 [phone]

## 2022-12-23 NOTE — Progress Notes (Signed)
ANTICOAGULATION CONSULT NOTE - Follow Up Consult  Pharmacy Consult for heparin infusion  Indication: chest pain/ACS  Allergies  Allergen Reactions   Asa [Aspirin] Anaphylaxis   Compazine [Prochlorperazine Edisylate] Swelling and Other (See Comments)    Tongue swelling    Nsaids Anaphylaxis   Shellfish Allergy Hives and Swelling   Sulfa Antibiotics Hives and Swelling   Zantac [Ranitidine Hcl] Hives and Swelling   Lisinopril Cough    Patient Measurements: Height: 5\' 1"  (154.9 cm) Weight: 65.3 kg (144 lb) IBW/kg (Calculated) : 47.8 Heparin Dosing Weight: 61.4 kg  Vital Signs: Temp: 97.6 F (36.4 C) (05/12 1505) Temp Source: Oral (05/12 1505) BP: 123/55 (05/12 1505) Pulse Rate: 70 (05/12 1505)  Labs: Recent Labs    12/23/22 0518 12/23/22 0726 12/23/22 0955 12/23/22 1152 12/23/22 1733  HGB 12.7  --   --   --   --   HCT 39.8  --   --   --   --   PLT 427*  --   --   --   --   HEPARINUNFRC  --   --   --   --  0.46  CREATININE 1.02*  --   --   --   --   TROPONINIHS 210* 2,608* 6,307* 6,570*  --      Estimated Creatinine Clearance: 40 mL/min (A) (by C-G formula based on SCr of 1.02 mg/dL (H)).   Assessment: 77 yo F presenting with chest pressure + shortness of breath. Pt has significant cardiac history - CAD s/p CABG (2015) and PCI to LCX (11/2022). She is not on anticoagulation PTA -antiplatelets only (note ASA allergy).   Hgb 12.7, Plt 427 HS Trop 2608 > 6307 > 6570 First heparin level therapeutic at 0.46 on heparin 750 units/hr No bleeding reported  Goal of Therapy:  Heparin level 0.3-0.7 units/ml Monitor platelets by anticoagulation protocol: Yes   Plan:  Continue heparin infusion at 750 units/hr Confirmatory HL at midnight Daily HL, CBC F/u cardiology plans   Loralee Pacas, PharmD, BCPS Please see amion for complete clinical pharmacist phone list 12/23/2022 6:57 PM

## 2022-12-23 NOTE — ED Notes (Signed)
ED TO INPATIENT HANDOFF REPORT  ED Nurse Name and Phone #: Eugene Gavia RN [phone]  S Name/Age/Gender Susan Barnett 77 y.o. female Room/Bed: 028C/028C  Code Status   Code Status: Full Code  Home/SNF/Other Home Patient oriented to: self, place, time, and situation Is this baseline? Yes   Triage Complete: Triage complete  Chief Complaint NSTEMI (non-ST elevated myocardial infarction) [facility]) [I21.4]  Triage Note Pt felt more tired than usual yesterday. At 0300 today pt woke up with chest pressure and SOB radiating to back. Took 3 nitro that improved CP but. PT HAD nstemi 2 WEEKS AGO WITH 2 stents placed. When EMS arrived bp 100/50 and pt was clammy and pale. Pt received 4 morphine and 4 zofran with ems. When pt arrived 2/10. NO ASA GIVEN BC PT HAS ALLERGY. PT IS ON BLOOD THINNER   Allergies Allergies  Allergen Reactions   Asa [Aspirin] Anaphylaxis   Compazine [Prochlorperazine Edisylate] Swelling and Other (See Comments)    Tongue swelling    Nsaids Anaphylaxis   Shellfish Allergy Hives and Swelling   Sulfa Antibiotics Hives and Swelling   Zantac [Ranitidine Hcl] Hives and Swelling   Lisinopril Cough    Level of Care/Admitting Diagnosis ED Disposition     ED Disposition  Admit   Condition  --   Comment  Hospital Area: [facility] [[id number]]  Level of Care: Progressive [102]  Admit to Progressive based on following criteria: CARDIOVASCULAR & THORACIC of moderate stability with acute coronary syndrome symptoms/low risk myocardial infarction/hypertensive urgency/arrhythmias/heart failure potentially compromising stability and stable post cardiovascular intervention patients.  May admit patient to [facility] or [facility] if equivalent level of care is available:: No  Covid Evaluation: Asymptomatic - no recent exposure (last 10 days) testing not required  Diagnosis: NSTEMI (non-ST elevated myocardial infarction) [facility]) [[id number]]  Admitting Physician: Tessa Lerner [[id number]]  Attending Physician: Tessa Lerner [[id number]]  Certification:: I certify this patient will need inpatient services for at least 2 midnights  Estimated Length of Stay: 2          B Medical/Surgery History Past Medical History:  Diagnosis Date   Anemia    mild   Anginal pain (HCC)    with exertion, last time prior to cardiac caht 06/02/14   Anxiety    situational   Arthritis    Breast cancer (HCC) 2018   Left Breast Cancer   CAD (coronary artery disease)    Dr. Mendel Ryder follows, no problems now   Cancer [facility])    Breast cancer   Complication of anesthesia    Diabetes mellitus, type 2 (HCC)    TYPE 2   Dysrhythmia    occ skips a beat, rate was fast before beta blocker.   Gastric polyp    Genetic testing 04/12/2017   Ms. Guarente underwent genetic counseling and testing for hereditary cancer syndromes on 02/19/2017. Her results were negative for mutations in all 46 genes analyzed by [employer]'s 46-gene Common Hereditary Cancers Panel. Genes analyzed include: APC, ATM, AXIN2, BARD1, BMPR1A, BRCA1, BRCA2, BRIP1, CDH1, CDKN2A, CHEK2, CTNNA1, DICER1, EPCAM, GREM1, HOXB13, KIT, MEN1, MLH1, MSH2, MSH3, MSH6, MUTYH, NBN   GERD (gastroesophageal reflux disease)    Headache    hx of   Heartburn    Hiatal hernia    mild head tremors   History of radiation therapy 01/30/17- 02/21/17   Left Breast 42.56 Gy in 16 fractions   Hypertension    Hypothyroidism    Occasional tremors    "  of head", slight hands   Osteopenia    Personal history of radiation therapy 2018   Left Breast Cancer   Pneumonia    PONV (postoperative nausea and vomiting) 02/24/14   Shortness of breath    with exertion   Sleep apnea    cpap used nightly x 2 yrs now.   Past Surgical History:  Procedure Laterality Date   BREAST BIOPSY Left 1987   BREAST BIOPSY Left 2019   BREAST LUMPECTOMY Left 01/25/2017   BREAST LUMPECTOMY WITH RADIOACTIVE SEED AND SENTINEL LYMPH NODE BIOPSY Left 12/25/2016    Procedure: BREAST LUMPECTOMY WITH RADIOACTIVE SEED AND SENTINEL LYMPH NODE BIOPSY;  Surgeon: Glenna Fellows, MD;  Location: [facility] OR;  Service: General;  Laterality: Left;   CARDIAC CATHETERIZATION  06/02/2014   LAD 30%, D1 80%, CFX 755, RCA > 95% ISR, rx w/ PTCA, heavy calcification, EF 65%   CORONARY ARTERY BYPASS GRAFT N/A 06/14/2014   Procedure: CORONARY ARTERY BYPASS GRAFTING (CABG);  Surgeon: Alleen Borne, MD;  Location[facility] OR;  Service: Open Heart Surgery;  Laterality: N/A;  Times 2 using endoscopically harvested right saphenous vein.   CORONARY STENT INTERVENTION N/A 12/10/2022   Procedure: CORONARY STENT INTERVENTION;  Surgeon: Elder Negus, MD;  Location: [facility] INVASIVE CV LAB;  Service: Cardiovascular;  Laterality: N/A;   coronary stents     x3 stents placed-prior ablation   CORONARY ULTRASOUND/IVUS N/A 12/10/2022   Procedure: Coronary Ultrasound/IVUS;  Surgeon: Elder Negus, MD;  Location: [facility] INVASIVE CV LAB;  Service: Cardiovascular;  Laterality: N/A;   DILATION AND CURETTAGE OF UTERUS     ELBOW FRACTURE SURGERY Left    INCONTINENCE SURGERY     sling   KNEE ARTHROSCOPY Right 02/24/2014   Procedure: RIGHT KNEE ARTHROSCOPY WITH DEBRIDEMENT ;  Surgeon: Loanne Drilling, MD;  Location: [facility] ORS;  Service: Orthopedics;  Laterality: Right;   LEFT HEART CATH AND CORS/GRAFTS ANGIOGRAPHY N/A 12/03/2022   Procedure: LEFT HEART CATH AND CORS/GRAFTS ANGIOGRAPHY;  Surgeon: Elder Negus, MD;  Location: [facility] INVASIVE CV LAB;  Service: Cardiovascular;  Laterality: N/A;   LEFT HEART CATH AND CORS/GRAFTS ANGIOGRAPHY N/A 12/10/2022   Procedure: LEFT HEART CATH AND CORS/GRAFTS ANGIOGRAPHY;  Surgeon: Elder Negus, MD;  Location: [facility] INVASIVE CV LAB;  Service: Cardiovascular;  Laterality: N/A;   LEFT HEART CATHETERIZATION WITH CORONARY ANGIOGRAM N/A 06/02/2014   Procedure: LEFT HEART CATHETERIZATION WITH CORONARY ANGIOGRAM;  Surgeon: Lesleigh Noe, MD;  Location: [facility] CATH LAB;   Service: Cardiovascular;  Laterality: N/A;   TEE WITHOUT CARDIOVERSION N/A 06/14/2014   Procedure: TRANSESOPHAGEAL ECHOCARDIOGRAM (TEE);  Surgeon: Alleen Borne, MD;  Location: [facility] OR;  Service: Open Heart Surgery;  Laterality: N/A;   TEMPORARY PACEMAKER N/A 12/03/2022   Procedure: TEMPORARY PACEMAKER;  Surgeon: Elder Negus, MD;  Location: [facility] INVASIVE CV LAB;  Service: Cardiovascular;  Laterality: N/A;     A IV Location/Drains/Wounds Patient Lines/Drains/Airways Status     Active Line/Drains/Airways     Name Placement date Placement time Site Days   Peripheral IV 12/23/22 Anterior;Proximal;Right Forearm 12/23/22  0502  Forearm  less than 1   Peripheral IV 12/23/22 22 G Posterior;Right Hand 12/23/22  0927  Hand  less than 1            Intake/Output Last 24 hours No intake or output data in the 24 hours ending 12/23/22 1410  Labs/Imaging Results for orders placed or performed during the hospital encounter of 12/23/22 (from the past  48 hour(s))  CBC with Differential/Platelet     Status: Abnormal   Collection Time: 12/23/22  5:18 AM  Result Value Ref Range   WBC 13.3 (H) 4.0 - 10.5 K/uL   RBC 4.44 3.87 - 5.11 MIL/uL   Hemoglobin 12.7 12.0 - 15.0 g/dL   HCT 65.7 84.6 - 96.2 %   MCV 89.6 80.0 - 100.0 fL   MCH 28.6 26.0 - 34.0 pg   MCHC 31.9 30.0 - 36.0 g/dL   RDW 95.2 84.1 - 32.4 %   Platelets 427 (H) 150 - 400 K/uL   nRBC 0.0 0.0 - 0.2 %   Neutrophils Relative % 74 %   Neutro Abs 9.8 (H) 1.7 - 7.7 K/uL   Lymphocytes Relative 16 %   Lymphs Abs 2.2 0.7 - 4.0 K/uL   Monocytes Relative 5 %   Monocytes Absolute 0.7 0.1 - 1.0 K/uL   Eosinophils Relative 3 %   Eosinophils Absolute 0.4 0.0 - 0.5 K/uL   Basophils Relative 1 %   Basophils Absolute 0.1 0.0 - 0.1 K/uL   Immature Granulocytes 1 %   Abs Immature Granulocytes 0.08 (H) 0.00 - 0.07 K/uL    Comment: Performed at [facility], [address] [place], [place] [address]  Basic metabolic panel     Status:  Abnormal   Collection Time: 12/23/22  5:18 AM  Result Value Ref Range   Sodium 134 (L) 135 - 145 mmol/L   Potassium 3.7 3.5 - 5.1 mmol/L   Chloride 100 98 - 111 mmol/L   CO2 22 22 - 32 mmol/L   Glucose, Bld 250 (H) 70 - 99 mg/dL    Comment: Glucose reference range applies only to samples taken after fasting for at least 8 hours.   BUN 16 8 - 23 mg/dL   Creatinine, Ser 7.25 (H) 0.44 - 1.00 mg/dL   Calcium 9.8 8.9 - 36.6 mg/dL   GFR, Estimated 57 (L) >60 mL/min    Comment: (NOTE) Calculated using the CKD-EPI Creatinine Equation (2021)    Anion gap 12 5 - 15    Comment: Performed at [facility], [address] [place], [place] [address]  Troponin I (High Sensitivity)     Status: Abnormal   Collection Time: 12/23/22  5:18 AM  Result Value Ref Range   Troponin I (High Sensitivity) 210 (HH) <18 ng/L    Comment: CRITICAL RESULT CALLED TO, READ BACK BY AND VERIFIED WITH T. DEAN, RN, 0630, 12/23/22, EADEDOKUN (NOTE) Elevated high sensitivity troponin I (hsTnI) values and significant  changes across serial measurements may suggest ACS but many other  chronic and acute conditions are known to elevate hsTnI results.  Refer to the "Links" section for chest pain algorithms and additional  guidance. Performed at [facility], [address], [place], [place] [address]   Brain natriuretic peptide     Status: Abnormal   Collection Time: 12/23/22  5:18 AM  Result Value Ref Range   B Natriuretic Peptide 129.6 (H) 0.0 - 100.0 pg/mL    Comment: Performed at [facility], [address] [place], [place] [address]  D-dimer, quantitative     Status: None   Collection Time: 12/23/22  5:18 AM  Result Value Ref Range   D-Dimer, Quant 0.39 0.00 - 0.50 ug/mL-FEU    Comment: (NOTE) At the manufacturer cut-off value of 0.5 g/mL FEU, this assay has a negative predictive value of 95-100%.This assay is intended for use  in conjunction with a clinical pretest probability (PTP)  assessment model to exclude pulmonary embolism (PE) and deep venous thrombosis (DVT) in outpatients suspected of PE or DVT. Results should be correlated with clinical presentation. Performed at [facility], [address], [place], [place] [address]   Troponin I (High Sensitivity)     Status: Abnormal   Collection Time: 12/23/22  7:26 AM  Result Value Ref Range   Troponin I (High Sensitivity) 2,608 (HH) <18 ng/L    Comment: CRITICAL VALUE NOTED. VALUE IS CONSISTENT WITH PREVIOUSLY REPORTED/CALLED VALUE (NOTE) Elevated high sensitivity troponin I (hsTnI) values and significant  changes across serial measurements may suggest ACS but many other  chronic and acute conditions are known to elevate hsTnI results.  Refer to the "Links" section for chest pain algorithms and additional  guidance. Performed at [facility], [address], [place], [place] [address]   Troponin I (High Sensitivity)     Status: Abnormal   Collection Time: 12/23/22  9:55 AM  Result Value Ref Range   Troponin I (High Sensitivity) 6,307 (HH) <18 ng/L    Comment: CRITICAL VALUE NOTED. VALUE IS CONSISTENT WITH PREVIOUSLY REPORTED/CALLED VALUE (NOTE) Elevated high sensitivity troponin I (hsTnI) values and significant  changes across serial measurements may suggest ACS but many other  chronic and acute conditions are known to elevate hsTnI results.  Refer to the "Links" section for chest pain algorithms and additional  guidance. Performed at [facility], [address] [place], [place] [address]   Troponin I (High Sensitivity)     Status: Abnormal   Collection Time: 12/23/22 11:52 AM  Result Value Ref Range   Troponin I (High Sensitivity) 6,570 (HH) <18 ng/L    Comment: CRITICAL VALUE NOTED. VALUE IS CONSISTENT WITH PREVIOUSLY REPORTED/CALLED VALUE (NOTE) Elevated high sensitivity troponin I (hsTnI) values and significant  changes across serial measurements may suggest ACS but many other   chronic and acute conditions are known to elevate hsTnI results.  Refer to the "Links" section for chest pain algorithms and additional  guidance. Performed at [facility], [address], [place], [place] [address]    DG Chest Port 1 View  Result Date: 12/23/2022 CLINICAL DATA:  77 year old female with history of shortness of breath. EXAM: PORTABLE CHEST 1 VIEW COMPARISON:  Chest x-ray 12/10/2022. FINDINGS: Lung volumes are normal. No consolidative airspace disease. No pleural effusions. No pneumothorax. No pulmonary nodule or mass noted. Pulmonary vasculature and the cardiomediastinal silhouette are within normal limits. Atherosclerosis in the thoracic aorta. Status post median sternotomy for CABG. IMPRESSION: 1.  No radiographic evidence of acute cardiopulmonary disease. 2. Aortic atherosclerosis. Electronically Signed   By: Trudie Reed M.D.   On: 12/23/2022 05:45    Pending Labs Unresulted Labs (From admission, onward)     Start     Ordered   12/24/22 0500  Heparin level (unfractionated)  Daily,   R      12/23/22 0859   12/23/22 1700  Heparin level (unfractionated)  Once-Timed,   URGENT        12/23/22 0859            Vitals/Pain Today's Vitals   12/23/22 1000 12/23/22 1100 12/23/22 1200 12/23/22 1300  BP: 132/66 127/61 (!) 139/56 129/60  Pulse: 73 64 65 69  Resp: 18 16 16 10   Temp:      TempSrc:      SpO2: 100% 100% 97% 99%  Weight:  Height:      PainSc:        Isolation Precautions No active isolations  Medications Medications  heparin ADULT infusion 100 units/mL (25000 units/283mL) (750 Units/hr Intravenous New Bag/Given 12/23/22 0921)  nitroGLYCERIN (NITROSTAT) SL tablet 0.4 mg (has no administration in time range)  acetaminophen (TYLENOL) tablet 650 mg (has no administration in time range)  ondansetron (ZOFRAN) injection 4 mg (has no administration in time range)  cilostazol (PLETAL) tablet 50 mg (50 mg Oral Given 12/23/22 1304)  ranolazine  (RANEXA) 12 hr tablet 500 mg (500 mg Oral Given 12/23/22 1304)  heparin bolus via infusion 3,600 Units (3,600 Units Intravenous Bolus from Bag 12/23/22 0922)  clopidogrel (PLAVIX) tablet 300 mg (300 mg Oral Given 12/23/22 1304)    Mobility walks     Focused Assessments Cardiac Assessment Handoff:  Cardiac Rhythm: Sinus bradycardia Lab Results  Component Value Date   CKTOTAL 150 08/12/2007   CKMB 4.8 (H) 08/12/2007   TROPONINI (H) 08/12/2007    0.07        PERSISTENTLY INCREASED TROPONIN VALUES IN THE RANGE OF 0.06-0.49 ng/mL CAN BE SEEN IN:       -UNSTABLE ANGINA   Lab Results  Component Value Date   DDIMER 0.39 12/23/2022   Does the Patient currently have chest pain? No    R Recommendations: See Admitting Provider Note  Report given to:   Additional Notes:

## 2022-12-23 NOTE — ED Notes (Signed)
NAD noted, respirations are equal bilaterally and unlabored at this time. Pt resting in gurney and denies any unmet needs. Pt connected to CCM, pulseox & BP. Call light within reach. Support persons at bedside.  

## 2022-12-23 NOTE — Progress Notes (Signed)
Pt awakened with 5/10 mid sternal chest pressure, diaphoresis, dyspnea and belching.  BP 140/60, HR up to 143 while ambulating to bathroom, 100-110 at rest sinus tachycardia.  Pt declined nitroglycerin.  Oxygen applied at 2l per [place].  EKG obtained.  Dr. Odis Hollingshead notifed with new orders.  Pt currently resting without complaints.

## 2022-12-23 NOTE — H&P (Addendum)
HISTORY AND PHYSICAL  Patient ID: Susan Barnett MRN: [id number] DOB/AGE: [birth year] 77 y.o.  Admit date: 12/23/2022 Attending physician: Tessa Lerner, DO Primary Physician:  Johny Blamer, MD Former Cardiology Providers: Dr. Verdis Prime Sleep medicine provider: Dr. Carolanne Grumbling  Chief complaint: chest pain   HPI:  Susan Barnett is a 77 y.o. female who presents with a chief complaint of "chest pain." Her past medical history and cardiovascular risk factors include: CAD status post CABG 2015 (SVG to diagonal and SVG to PDA) and s/p PCI to LCX (11/2022), hypertension, diabetes mellitus type 2, hyperlipidemia, OSA .  Patient presents to the hospital with a chief complaint of chest pain.  She was in usual state of health yesterday.  Woke up in the middle of the night at 3 AM with anterior chest pain that has been similar to her prior anginal equivalents but overall severity is less.  She checked her blood pressures at that time and SBP was 180 mmHg.  She took sublingual nitroglycerin tablets x 3, 5 minutes apart with no significant improvement.  She called EMS and patient was brought to the hospital for further evaluation.  Patient states that she has been under a lot of stress due to close family members being sick which may be contributory as well.  At the time of evaluation she denies chest pain.  Blood pressures are better controlled.  Lab values note elevated BNP but lower compared to her baseline, renal function within acceptable limits, high sensitive troponins trending up.   ALLERGIES: Allergies  Allergen Reactions   Asa [Aspirin] Anaphylaxis   Compazine [Prochlorperazine Edisylate] Swelling and Other (See Comments)    Tongue swelling    Nsaids Anaphylaxis   Shellfish Allergy Hives and Swelling   Sulfa Antibiotics Hives and Swelling   Zantac [Ranitidine Hcl] Hives and Swelling   Lisinopril Cough    PAST MEDICAL HISTORY: Past Medical History:  Diagnosis Date   Anemia     mild   Anginal pain (HCC)    with exertion, last time prior to cardiac caht 06/02/14   Anxiety    situational   Arthritis    Breast cancer (HCC) 2018   Left Breast Cancer   CAD (coronary artery disease)    Dr. Mendel Ryder follows, no problems now   Cancer [facility])    Breast cancer   Complication of anesthesia    Diabetes mellitus, type 2 (HCC)    TYPE 2   Dysrhythmia    occ skips a beat, rate was fast before beta blocker.   Gastric polyp    Genetic testing 04/12/2017   Ms. Reuther underwent genetic counseling and testing for hereditary cancer syndromes on 02/19/2017. Her results were negative for mutations in all 46 genes analyzed by [facility]'s 46-gene Common Hereditary Cancers Panel. Genes analyzed include: APC, ATM, AXIN2, BARD1, BMPR1A, BRCA1, BRCA2, BRIP1, CDH1, CDKN2A, CHEK2, CTNNA1, DICER1, EPCAM, GREM1, HOXB13, KIT, MEN1, MLH1, MSH2, MSH3, MSH6, MUTYH, NBN   GERD (gastroesophageal reflux disease)    Headache    hx of   Heartburn    Hiatal hernia    mild head tremors   History of radiation therapy 01/30/17- 02/21/17   Left Breast 42.56 Gy in 16 fractions   Hypertension    Hypothyroidism    Occasional tremors    "of head", slight hands   Osteopenia    Personal history of radiation therapy 2018   Left Breast Cancer   Pneumonia    PONV (postoperative nausea and vomiting)  02/24/14   Shortness of breath    with exertion   Sleep apnea    cpap used nightly x 2 yrs now.    PAST SURGICAL HISTORY: Past Surgical History:  Procedure Laterality Date   BREAST BIOPSY Left 1987   BREAST BIOPSY Left 2019   BREAST LUMPECTOMY Left 01/25/2017   BREAST LUMPECTOMY WITH RADIOACTIVE SEED AND SENTINEL LYMPH NODE BIOPSY Left 12/25/2016   Procedure: BREAST LUMPECTOMY WITH RADIOACTIVE SEED AND SENTINEL LYMPH NODE BIOPSY;  Surgeon: Glenna Fellows, MD;  Location: [facility] OR;  Service: General;  Laterality: Left;   CARDIAC CATHETERIZATION  06/02/2014   LAD 30%, D1 80%, CFX 755, RCA > 95% ISR, rx w/  PTCA, heavy calcification, EF 65%   CORONARY ARTERY BYPASS GRAFT N/A 06/14/2014   Procedure: CORONARY ARTERY BYPASS GRAFTING (CABG);  Surgeon: Alleen Borne, MD;  Location: [facility] OR;  Service: Open Heart Surgery;  Laterality: N/A;  Times 2 using endoscopically harvested right saphenous vein.   CORONARY STENT INTERVENTION N/A 12/10/2022   Procedure: CORONARY STENT INTERVENTION;  Surgeon: Elder Negus, MD;  Location: [facility] INVASIVE CV LAB;  Service: Cardiovascular;  Laterality: N/A;   coronary stents     x3 stents placed-prior ablation   CORONARY ULTRASOUND/IVUS N/A 12/10/2022   Procedure: Coronary Ultrasound/IVUS;  Surgeon: Elder Negus, MD;  Location: [facility] INVASIVE CV LAB;  Service: Cardiovascular;  Laterality: N/A;   DILATION AND CURETTAGE OF UTERUS     ELBOW FRACTURE SURGERY Left    INCONTINENCE SURGERY     sling   KNEE ARTHROSCOPY Right 02/24/2014   Procedure: RIGHT KNEE ARTHROSCOPY WITH DEBRIDEMENT ;  Surgeon: Loanne Drilling, MD;  Location: [facility] ORS;  Service: Orthopedics;  Laterality: Right;   LEFT HEART CATH AND CORS/GRAFTS ANGIOGRAPHY N/A 12/03/2022   Procedure: LEFT HEART CATH AND CORS/GRAFTS ANGIOGRAPHY;  Surgeon: Elder Negus, MD;  Location: [facility] INVASIVE CV LAB;  Service: Cardiovascular;  Laterality: N/A;   LEFT HEART CATH AND CORS/GRAFTS ANGIOGRAPHY N/A 12/10/2022   Procedure: LEFT HEART CATH AND CORS/GRAFTS ANGIOGRAPHY;  Surgeon: Elder Negus, MD;  Location: [facility] INVASIVE CV LAB;  Service: Cardiovascular;  Laterality: N/A;   LEFT HEART CATHETERIZATION WITH CORONARY ANGIOGRAM N/A 06/02/2014   Procedure: LEFT HEART CATHETERIZATION WITH CORONARY ANGIOGRAM;  Surgeon: Lesleigh Noe, MD;  Location: [facility] CATH LAB;  Service: Cardiovascular;  Laterality: N/A;   TEE WITHOUT CARDIOVERSION N/A 06/14/2014   Procedure: TRANSESOPHAGEAL ECHOCARDIOGRAM (TEE);  Surgeon: Alleen Borne, MD;  Location: [facility] OR;  Service: Open Heart Surgery;  Laterality: N/A;   TEMPORARY PACEMAKER N/A  12/03/2022   Procedure: TEMPORARY PACEMAKER;  Surgeon: Elder Negus, MD;  Location: [facility] INVASIVE CV LAB;  Service: Cardiovascular;  Laterality: N/A;    FAMILY HISTORY: The patient family history includes Breast cancer (age of onset: 39) in her cousin; Diabetes in her mother; Healthy in her brother; Heart attack in her mother; Hodgkin's lymphoma in her maternal uncle; Hypertension in her mother; Lung cancer in her brother; Ulcerative colitis in her daughter.   SOCIAL HISTORY:  The patient  reports that she has never smoked. She has never used smokeless tobacco. She reports that she does not drink alcohol and does not use drugs.  MEDICATIONS: Current Outpatient Medications  Medication Instructions   acetaminophen (TYLENOL) 1,300 mg, Oral, Daily at bedtime   Align 4 mg, Oral, Daily   amLODipine (NORVASC) 5 mg, Oral, Daily   atorvastatin (LIPITOR) 40 mg, Oral, Daily   Brilinta 90 mg, Oral, 2  times daily   CALCIUM PO 1 tablet, Oral, 2 times daily   Cetirizine HCl (ZYRTEC ALLERGY) 10 MG CAPS 1 tablet, Oral, Daily   cloNIDine (CATAPRES) 0.1 mg, Oral, Every other day   Coenzyme Q10 300 mg, Oral, 2 times daily   D-Mannose 500 mg, Oral, 2 times daily   empagliflozin (JARDIANCE) 10 mg, Oral, Daily   hydrochlorothiazide (MICROZIDE) 12.5 mg, Oral, Daily   Lantus SoloStar 10 Units, Subcutaneous, Daily at bedtime   levothyroxine (SYNTHROID) 75 mcg, Oral, Daily before breakfast   losartan (COZAAR) 100 mg, Oral, Daily   metoprolol tartrate (LOPRESSOR) 50 mg, Oral, 2 times daily   Multiple Vitamin (MULTIVITAMIN ADULT PO) 1 tablet, Oral, Daily   nitroGLYCERIN (NITROSTAT) 0.4 mg, Sublingual, Every 5 min PRN   ONETOUCH VERIO test strip 1 each, 2 times daily   pantoprazole (PROTONIX) 40 mg, Oral, Daily before breakfast     heparin 750 Units/hr (12/23/22 0921)    REVIEW OF SYSTEMS: Review of Systems  Constitutional:       Increased stress   Cardiovascular:  Positive for chest pain (on  arrival - none now). Negative for claudication, dyspnea on exertion, irregular heartbeat, leg swelling, near-syncope, orthopnea, palpitations, paroxysmal nocturnal dyspnea and syncope.  Respiratory:  Positive for shortness of breath (on arrival - none now).   Hematologic/Lymphatic: Negative for bleeding problem.  Musculoskeletal:  Negative for muscle cramps and myalgias.  Neurological:  Negative for dizziness and light-headedness.  All other systems reviewed and are negative.   PHYSICAL EXAM:    12/23/2022    8:11 PM 12/23/2022    3:05 PM 12/23/2022    1:00 PM  Vitals with BMI  Systolic 133 123 161  Diastolic 59 55 60  Pulse 95 70 69    No intake or output data in the 24 hours ending 12/23/22 2048  Net IO Since Admission: No IO data has been entered for this period [12/23/22 2048] Physical Exam  Constitutional: No distress.  Age appropriate, hemodynamically stable.   Neck: No JVD present.  Cardiovascular: Normal rate, regular rhythm, S1 normal, S2 normal, intact distal pulses and normal pulses. Exam reveals no gallop, no S3 and no S4.  No murmur heard. Pulmonary/Chest: Effort normal and breath sounds normal. No stridor. She has no wheezes. She has no rales.  Abdominal: Soft. Bowel sounds are normal. She exhibits no distension. There is no abdominal tenderness.  Musculoskeletal:        General: No edema.     Cervical back: Neck supple.  Neurological: She is alert and oriented to person, place, and time. She has intact cranial nerves (2-12).  Skin: Skin is warm and moist.   RADIOLOGY: DG Chest Port 1 View  Result Date: 12/23/2022 CLINICAL DATA:  77 year old female with history of shortness of breath. EXAM: PORTABLE CHEST 1 VIEW COMPARISON:  Chest x-ray 12/10/2022. FINDINGS: Lung volumes are normal. No consolidative airspace disease. No pleural effusions. No pneumothorax. No pulmonary nodule or mass noted. Pulmonary vasculature and the cardiomediastinal silhouette are within normal  limits. Atherosclerosis in the thoracic aorta. Status post median sternotomy for CABG. IMPRESSION: 1.  No radiographic evidence of acute cardiopulmonary disease. 2. Aortic atherosclerosis. Electronically Signed   By: Trudie Reed M.D.   On: 12/23/2022 05:45    LABORATORY DATA: Lab Results  Component Value Date   WBC 13.3 (H) 12/23/2022   HGB 12.7 12/23/2022   HCT 39.8 12/23/2022   MCV 89.6 12/23/2022   PLT 427 (H) 12/23/2022    Recent  Labs  Lab 12/23/22 0518  NA 134*  K 3.7  CL 100  CO2 22  BUN 16  CREATININE 1.02*  CALCIUM 9.8  GLUCOSE 250*    Lipid Panel  Lab Results  Component Value Date   CHOL 114 12/05/2022   HDL 39 (L) 12/05/2022   LDLCALC 45 12/05/2022   LDLDIRECT 51 12/05/2022   TRIG 149 12/05/2022   CHOLHDL 2.9 12/05/2022    BNP (last 3 results) Recent Labs    12/03/22 0012 12/10/22 1605 12/23/22 0518  BNP 73.3 736.8* 129.6*    HEMOGLOBIN A1C Lab Results  Component Value Date   HGBA1C 8.7 (H) 12/03/2022   MPG 202.99 12/03/2022    Cardiac Panel (last 3 results) Cardiac Panel (last 3 results) Recent Labs    12/23/22 0726 12/23/22 0955 12/23/22 1152  TROPONINIHS 2,608* 6,307* 6,570*     Lab Results  Component Value Date   CKTOTAL 150 08/12/2007   CKMB 4.8 (H) 08/12/2007     TSH Recent Labs    12/03/22 0540  TSH 0.381      CARDIAC DATABASE: EKG: Dec 20, 2022: Normal sinus rhythm, 68 bpm, LAE, consider inferior infarct, TWI in inferior leads consider ischemia.  Compared to prior EKG first-degree AV block no longer present.  Dec 23, 2022: Sinus bradycardia, 57 bpm, TWI in inferior leads concerning for ischemia, without underlying injury pattern similar to prior tracings.  Dec 23, 2022: Sinus rhythm, 61 bpm, TWI in inferior leads concerning for ischemia, without underlying injury pattern.   Echocardiogram: 09/01/2018: LVEF 55 to 60%, grade 2 diastolic dysfunction, trivial AR, mild MR, mild left atrial enlargement.   December 03, 2022:  1. Left ventricular ejection fraction, by estimation, is 60 to 65%. The  left ventricle has normal function. There is mild concentric left  ventricular hypertrophy. Left ventricular diastolic parameters are  consistent with Grade II diastolic dysfunction  (pseudonormalization). On the 2-chamber contrast images there appears to  be a small segment of akinesis in the basilar to mid inferior wall. This  is not seen in any other images.   2. Right ventricular systolic function is normal. The right ventricular  size is normal. There is normal pulmonary artery systolic pressure.   3. Left atrial size was mildly dilated.   4. The mitral valve is normal in structure. Mild mitral valve  regurgitation. No evidence of mitral stenosis.   5. The aortic valve is tricuspid. There is mild calcification of the  aortic valve. Aortic valve regurgitation is trivial. No aortic stenosis is  present.   6. The inferior vena cava is normal in size with <50% respiratory  variability, suggesting right atrial pressure of 8 mmHg.    Stress Testing: MPI 05/18/2020            Nuclear stress EF: 72%. No wall motion abnormalities.            There was no ST segment deviation noted during stress.            The study is normal.            This is a low risk study. No evidence of ischemia or infarction. Prior CABG.   Heart Catheterization: December 03, 2022: LM: Normal LAD: Prox-mid 30% diffuse disease, distal 75% diffuse disease Lcx: Mid 75% diffuse disease RCA: Prox 80% diffuse disease, followed 100 mid ISR SVG-PDA: Patent SVG-diag: Could not be visualized   Temporary transvenous pacemaker placement.   Patient was very confused and  kept attempting to sit up.  I did not feel leaving femoral sheath in place was safe.  Therefore, I decided to go ahead and place 5 [place] Mynx closure.   With transient hypotension, patient was very confused and disoriented.  Code stroke was called.  Patient was evaluated by  neurology and thought to be less likely to have had stroke.  CT head was ordered out of abundance caution.  Patient is mental alertness improved with time.  She will be kept into heart ICU overnight for monitoring.  Temporary pacemaker will be removed tomorrow morning if no further use is needed.   Unusually prolonged procedure time due to intraprocedural hypotension, complete AV block requiring placement of temporary transvenous placement, disorientation requiring further neurological evaluation.   December 10, 2022: LM: Mild disease LAD: Prox-mid 30% diffuse disease, distal 75% diffuse disease Lcx: Prox 30%, mid 75% diffuse disease, followed by focal 90% stenosis RCA: Not engaged today. Known Prox 80% diffuse disease, followed 100 mid ISR SVG-PDA: Not engaged today. Known patent SVG-diag: Patent. No significant disease   LVEDP 17 mmHg   Successful percutaneous coronary intervention mid LCx        PTCA and stent placement with overlapping stents        2.0 X 18 mm and 2.25 X 15 Onyx drug-eluting stent        Post dilatation with 2.25 mm stent balloon and 2.5X15 mm Ayr balloon up to 16 atm     IMPRESSION & RECOMMENDATIONS: Susan Barnett is a 77 y.o. Caucasian female whose past medical history and cardiovascular risk factors include: CAD status post CABG 2015 (SVG to diagonal and SVG to PDA) and s/p PCI to LCX (11/2022), hypertension, diabetes mellitus type 2, hyperlipidemia, OSA .  Impression:  NSTEMI CAD status post 2v CABG (in 2015) with recent PCI to the LCx Chronic HFpEF Hypertension Hyperlipidemia, mixed. Insulin-dependent diabetes mellitus type 2. OSA  Plan:  NSTEMI Patient presents with anginal discomfort similar to her equivalents of but less intense.  EKG does not show myocardial injury pattern. But high sensitive troponins are up trended ruling her in for NSTEMI. Reviewed the most recent angiogram with on-call interventional Dr. Jacinto Halim.  Overall probability of in-stent  restenosis due to acute thrombosis is less likely.  However, current symptoms may be secondary to residual disease in the obtuse marginal branch, diagonal branch, or SVG to diagonal graft. Start IV heparin per ACS protocol likely for 48 hours Sublingual nitroglycerin tablets or even drip if needed for symptom management We will proceed with limited echo to reevaluate LVEF and regional wall motion abnormalities. Trend troponins Given her shortness of breath due to Brilinta -shared decision with Dr. Jacinto Halim was to transition her to Plavix and Pletal. Reemphasized importance of improving her modifiable cardiovascular risk factors. Spoke to both patient, sister, and husband during today's encounter.  Depending on her clinical trajectory, reevaluation after angiography images with interventional cardiology, and reevaluation of LVEF/regional wall motion abnormalities will determine clinical trajectory (medical management versus repeat angiography with possible intervention). Patient and family agreeable. Start Ranexa 500 mg p.o. twice daily. Continue home dose beta-blockers. If needed consider addition of Imdur for blood pressure and antianginal properties.  Chronic HFpEF: Stage B, NYHA class II Prior echocardiogram noted grade 2 diastolic dysfunction. BNP elevated but lower compared to prior levels Start Lasix 20 mg IV push daily. Strict I's and O's, daily weights. Will uptitrate GDMT as hemodynamics and laboratory values allow  Hypertension: Resume home meds  Insulin-dependent  diabetes mellitus type 2: Resume home dose of Lantus and sliding scale insulin.  Mixed hyperlipidemia: Continue statin therapy  OSA: Patient is encouraged to bring her own device from home for utilization.  Consultants: None   Code Status: Full     Family Communication:  Husband and Sister    Disposition Plan: Home when stable.    Total encounter time 82 minutes. *Total Encounter Time as defined by the Centers  for Medicare and Medicaid Services includes, in addition to the face-to-face time of a patient visit (documented in the note above) non-face-to-face time: obtaining and reviewing outside history, ordering and reviewing medications, tests or procedures, care coordination (communications with other health care professionals or caregivers) and documentation in the medical record.   Patient's questions and concerns were addressed to her satisfaction. She voices understanding of the instructions provided during this encounter.   This note was created using a voice recognition software as a result there may be grammatical errors inadvertently enclosed that do not reflect the nature of this encounter. Every attempt is made to correct such errors.  Delilah Shan [facility]  Pager:  [phone] Office: [phone] 12/23/2022, 8:48 PM

## 2022-12-23 NOTE — Progress Notes (Signed)
ANTICOAGULATION CONSULT NOTE - Follow Up Consult  Pharmacy Consult for heparin infusion  Indication: chest pain/ACS  Allergies  Allergen Reactions   Asa [Aspirin] Anaphylaxis   Compazine [Prochlorperazine Edisylate] Swelling and Other (See Comments)    Tongue swelling    Nsaids Anaphylaxis   Shellfish Allergy Hives and Swelling   Sulfa Antibiotics Hives and Swelling   Zantac [Ranitidine Hcl] Hives and Swelling   Lisinopril Cough    Patient Measurements: Height: 5\' 1"  (154.9 cm) Weight: 65.3 kg (144 lb) IBW/kg (Calculated) : 47.8 Heparin Dosing Weight: 61.4 kg  Vital Signs: Temp: 97.7 F (36.5 C) (05/12 0506) Temp Source: Oral (05/12 0506) BP: 116/64 (05/12 0800) Pulse Rate: 64 (05/12 0800)  Labs: Recent Labs    12/23/22 0518 12/23/22 0726  HGB 12.7  --   HCT 39.8  --   PLT 427*  --   CREATININE 1.02*  --   TROPONINIHS 210* 2,608*    Estimated Creatinine Clearance: 40 mL/min (A) (by C-G formula based on SCr of 1.02 mg/dL (H)).   Assessment: 77 yo F presenting with chest pressure + shortness of breath. Pt has significant cardiac history - CAD s/p CABG (2015) and PCI to LCX (11/2022). She is not on anticoagulation PTA -antiplatelets only (note ASA allergy).   Hgb 12.7, Plt 427 HS Trop 2608   Goal of Therapy:  Heparin level 0.3-0.7 units/ml Monitor platelets by anticoagulation protocol: Yes   Plan:  Heparin bolus 3600 units IV x 1 Start heparin infusion at 750 units/hr 8hr HL at 1700  Daily HL, CBC F/u cardiology plans   Calton Dach, PharmD Clinical Pharmacist 12/23/2022 8:54 AM

## 2022-12-23 NOTE — ED Notes (Signed)
NAD noted, respirations are equal bilaterally and unlabored at this time. Pt resting in gurney and denies any unmet needs. Pt connected to CCM, pulseox & BP. Call light within reach. Support persons at bedside.

## 2022-12-24 ENCOUNTER — Inpatient Hospital Stay (HOSPITAL_COMMUNITY): Payer: Medicare Other

## 2022-12-24 ENCOUNTER — Telehealth (HOSPITAL_COMMUNITY): Payer: Self-pay | Admitting: Pharmacy Technician

## 2022-12-24 ENCOUNTER — Other Ambulatory Visit (HOSPITAL_COMMUNITY): Payer: Self-pay

## 2022-12-24 ENCOUNTER — Inpatient Hospital Stay (HOSPITAL_COMMUNITY)
Admission: EM | Disposition: A | Discharge status: Home Health | Payer: Self-pay | Source: Home / Self Care | Attending: Cardiology

## 2022-12-24 HISTORY — PX: LEFT HEART CATH AND CORS/GRAFTS ANGIOGRAPHY: CATH118250

## 2022-12-24 LAB — ECHOCARDIOGRAM LIMITED
Height: 61 in
S' Lateral: 2.6 cm
Weight: 2304 oz

## 2022-12-24 LAB — TROPONIN I (HIGH SENSITIVITY): Troponin I (High Sensitivity): 3346 ng/L (ref <18)

## 2022-12-24 LAB — GLUCOSE, CAPILLARY
Glucose-Capillary: 155 mg/dL — ABNORMAL HIGH (ref 70–99)
Glucose-Capillary: 101 mg/dL — ABNORMAL HIGH (ref 70–99)
Glucose-Capillary: 89 mg/dL (ref 70–99)
Glucose-Capillary: 162 mg/dL — ABNORMAL HIGH (ref 70–99)

## 2022-12-24 LAB — HEPARIN LEVEL (UNFRACTIONATED)
Heparin Unfractionated: 0.45 IU/mL (ref 0.30–0.70)
Heparin Unfractionated: 0.43 IU/mL (ref 0.30–0.70)

## 2022-12-24 SURGERY — LEFT HEART CATH AND CORS/GRAFTS ANGIOGRAPHY
Anesthesia: LOCAL

## 2022-12-24 MED ORDER — PERFLUTREN LIPID MICROSPHERE
1.0000 mL | INTRAVENOUS | Status: AC | PRN
  Administered 2022-12-24: 2 mL via INTRAVENOUS

## 2022-12-24 MED ORDER — MIDAZOLAM HCL 2 MG/2ML IJ SOLN
INTRAMUSCULAR | Status: AC
  Filled 2022-12-24: qty 2

## 2022-12-24 MED ORDER — ALUM & MAG HYDROXIDE-SIMETH 200-200-20 MG/5ML PO SUSP: 30.0000 mL | ORAL | Status: DC | PRN

## 2022-12-24 MED ORDER — LIDOCAINE HCL (PF) 1 % IJ SOLN
INTRAMUSCULAR | Status: DC | PRN
  Administered 2022-12-24: 10 mL

## 2022-12-24 MED ORDER — SODIUM CHLORIDE 0.9% FLUSH: 3.0000 mL | Freq: Two times a day (BID) | INTRAVENOUS | Status: DC

## 2022-12-24 MED ORDER — HYDRALAZINE HCL 20 MG/ML IJ SOLN: 10.0000 mg | INTRAMUSCULAR | Status: AC | PRN

## 2022-12-24 MED ORDER — FENTANYL CITRATE (PF) 100 MCG/2ML IJ SOLN
INTRAMUSCULAR | Status: AC
  Filled 2022-12-24: qty 2

## 2022-12-24 MED ORDER — HEPARIN (PORCINE) IN NACL 1000-0.9 UT/500ML-% IV SOLN
INTRAVENOUS | Status: DC | PRN
  Administered 2022-12-24 (×2): 500 mL

## 2022-12-24 MED ORDER — SODIUM CHLORIDE 0.9% FLUSH
3.0000 mL | Freq: Two times a day (BID) | INTRAVENOUS | Status: DC
  Administered 2022-12-24: 3 mL via INTRAVENOUS

## 2022-12-24 MED ORDER — LIDOCAINE HCL (PF) 1 % IJ SOLN
INTRAMUSCULAR | Status: AC
  Filled 2022-12-24: qty 30

## 2022-12-24 MED ORDER — FENTANYL CITRATE (PF) 100 MCG/2ML IJ SOLN
INTRAMUSCULAR | Status: DC | PRN
  Administered 2022-12-24: 50 ug via INTRAVENOUS

## 2022-12-24 MED ORDER — SODIUM CHLORIDE 0.9 % IV SOLN: 250.0000 mL | INTRAVENOUS | Status: DC | PRN

## 2022-12-24 MED ORDER — ONDANSETRON HCL 4 MG/2ML IJ SOLN: 4.0000 mg | Freq: Four times a day (QID) | INTRAMUSCULAR | Status: DC | PRN

## 2022-12-24 MED ORDER — VERAPAMIL HCL 2.5 MG/ML IV SOLN
INTRAVENOUS | Status: AC
  Filled 2022-12-24: qty 2

## 2022-12-24 MED ORDER — SODIUM CHLORIDE 0.9 % IV SOLN: INTRAVENOUS | Status: AC

## 2022-12-24 MED ORDER — SODIUM CHLORIDE 0.9 % IV SOLN: INTRAVENOUS | Status: DC

## 2022-12-24 MED ORDER — ACETAMINOPHEN 325 MG PO TABS: 650.0000 mg | ORAL_TABLET | ORAL | Status: DC | PRN

## 2022-12-24 MED ORDER — HEPARIN SODIUM (PORCINE) 1000 UNIT/ML IJ SOLN
INTRAMUSCULAR | Status: AC
  Filled 2022-12-24: qty 10

## 2022-12-24 MED ORDER — NITROGLYCERIN IN D5W 200-5 MCG/ML-% IV SOLN
0.0000 ug/min | INTRAVENOUS | Status: DC
  Administered 2022-12-24: 5 ug/min via INTRAVENOUS
  Filled 2022-12-24: qty 250

## 2022-12-24 MED ORDER — SODIUM CHLORIDE 0.9% FLUSH: 3.0000 mL | INTRAVENOUS | Status: DC | PRN

## 2022-12-24 MED ORDER — MIDAZOLAM HCL 2 MG/2ML IJ SOLN
INTRAMUSCULAR | Status: DC | PRN
  Administered 2022-12-24: 1 mg via INTRAVENOUS

## 2022-12-24 MED ORDER — HEPARIN (PORCINE) 25000 UT/250ML-% IV SOLN
750.0000 [IU]/h | INTRAVENOUS | Status: DC
  Filled 2022-12-24: qty 250

## 2022-12-24 MED ORDER — IOHEXOL 350 MG/ML SOLN
INTRAVENOUS | Status: DC | PRN
  Administered 2022-12-24: 50 mL

## 2022-12-24 MED ORDER — LABETALOL HCL 5 MG/ML IV SOLN: 10.0000 mg | INTRAVENOUS | Status: AC | PRN

## 2022-12-24 SURGICAL SUPPLY — 14 items
CATH EXPO 5F MPA-1 (CATHETERS) IMPLANT
CATH INFINITI 5 FR LCB (CATHETERS) IMPLANT
CATH INFINITI 5FR AL1 (CATHETERS) IMPLANT
CATH INFINITI 5FR MULTPACK ANG (CATHETERS) IMPLANT
CLOSURE MYNX CONTROL 5F (Vascular Products) IMPLANT
ELECT DEFIB PAD ADLT CADENCE (PAD) IMPLANT
KIT HEART LEFT (KITS) ×1 IMPLANT
KIT MICROPUNCTURE NIT STIFF (SHEATH) IMPLANT
PACK CARDIAC CATHETERIZATION (CUSTOM PROCEDURE TRAY) ×1 IMPLANT
SHEATH PINNACLE 5F 10CM (SHEATH) IMPLANT
SHEATH PROBE COVER 6X72 (BAG) IMPLANT
TRANSDUCER W/STOPCOCK (MISCELLANEOUS) ×1 IMPLANT
TUBING CIL FLEX 10 FLL-RA (TUBING) ×1 IMPLANT
WIRE EMERALD 3MM-J .035X150CM (WIRE) IMPLANT

## 2022-12-24 NOTE — H&P (View-Only) (Signed)
Subjective:  Patient seen and examined at bedside, she is still having chest pain at rest despite being on nitro drip. She is agreeable for going for repeat heart catheterization today. Her chest pain is a little better with nitro but it is still present at rest.  Intake/Output from previous day:  I/O last 3 completed shifts: In: 441.2 [P.O.:240; I.V.:201.2] Out: -  No intake/output data recorded. Net IO Since Admission: 441.21 mL [12/24/22 1031]  Blood pressure (!) 140/69, pulse 79, temperature 98 F (36.7 C), temperature source Oral, resp. rate 20, height 5' 1" (1.549 m), weight 65.3 kg, SpO2 100 %. Physical Exam Vitals reviewed.  HENT:     Head: Normocephalic and atraumatic.  Cardiovascular:     Rate and Rhythm: Normal rate and regular rhythm.     Heart sounds: No murmur heard. Pulmonary:     Effort: Pulmonary effort is normal.     Breath sounds: Normal breath sounds.  Abdominal:     General: Bowel sounds are normal.  Musculoskeletal:     Right lower leg: No edema.     Left lower leg: No edema.  Skin:    General: Skin is warm and dry.  Neurological:     Mental Status: She is alert.     Lab Results: Lab Results  Component Value Date   NA 134 (L) 12/23/2022   K 3.7 12/23/2022   CO2 22 12/23/2022   GLUCOSE 250 (H) 12/23/2022   BUN 16 12/23/2022   CREATININE 1.02 (H) 12/23/2022   CALCIUM 9.8 12/23/2022   EGFR 89 11/30/2022   GFRNONAA 57 (L) 12/23/2022    BNP (last 3 results) Recent Labs    12/03/22 0012 12/10/22 1605 12/23/22 0518  BNP 73.3 736.8* 129.6*    ProBNP (last 3 results) Recent Labs    11/30/22 1154  PROBNP 793*      Latest Ref Rng & Units 12/23/2022    5:18 AM 12/11/2022    2:25 AM 12/10/2022    3:45 PM  BMP  Glucose 70 - 99 mg/dL 250  102  123   BUN 8 - 23 mg/dL 16  10  11   Creatinine 0.44 - 1.00 mg/dL 1.02  0.65  0.62   Sodium 135 - 145 mmol/L 134  134  138   Potassium 3.5 - 5.1 mmol/L 3.7  2.8  3.8   Chloride 98 - 111 mmol/L 100  101   103   CO2 22 - 32 mmol/L 22  22  23   Calcium 8.9 - 10.3 mg/dL 9.8  8.7  9.3       Latest Ref Rng & Units 12/03/2022    5:40 AM 12/03/2022   12:12 AM 12/21/2019    3:45 AM  Hepatic Function  Total Protein 6.5 - 8.1 g/dL 6.7  6.2  6.9   Albumin 3.5 - 5.0 g/dL 3.5  3.3  3.7   AST 15 - 41 U/L 25  21  18   ALT 0 - 44 U/L 18  16  18   Alk Phosphatase 38 - 126 U/L 40  44  69   Total Bilirubin 0.3 - 1.2 mg/dL 0.6  0.4  0.6       Latest Ref Rng & Units 12/23/2022    5:18 AM 12/11/2022    2:25 AM 12/10/2022    3:45 PM  CBC  WBC 4.0 - 10.5 K/uL 13.3  14.2  13.3   Hemoglobin 12.0 - 15.0 g/dL 12.7  11.9    10.9   Hematocrit 36.0 - 46.0 % 39.8  36.0  33.7   Platelets 150 - 400 K/uL 427  391  405    Lipid Panel     Component Value Date/Time   CHOL 114 12/05/2022 1057   TRIG 149 12/05/2022 1057   HDL 39 (L) 12/05/2022 1057   CHOLHDL 2.9 12/05/2022 1057   VLDL 30 12/05/2022 1057   LDLCALC 45 12/05/2022 1057   LDLDIRECT 51 12/05/2022 1057   Cardiac Panel (last 3 results) No results for input(s): "CKTOTAL", "CKMB", "TROPONINI", "RELINDX" in the last 72 hours.  HEMOGLOBIN A1C Lab Results  Component Value Date   HGBA1C 8.7 (H) 12/03/2022   MPG 202.99 12/03/2022   TSH Recent Labs    12/03/22 0540  TSH 0.381   Imaging: Imaging results have been reviewed and DG Chest Port 1 View  Result Date: 12/23/2022 CLINICAL DATA:  77-year-old female with history of shortness of breath. EXAM: PORTABLE CHEST 1 VIEW COMPARISON:  Chest x-ray 12/10/2022. FINDINGS: Lung volumes are normal. No consolidative airspace disease. No pleural effusions. No pneumothorax. No pulmonary nodule or mass noted. Pulmonary vasculature and the cardiomediastinal silhouette are within normal limits. Atherosclerosis in the thoracic aorta. Status post median sternotomy for CABG. IMPRESSION: 1.  No radiographic evidence of acute cardiopulmonary disease. 2. Aortic atherosclerosis. Electronically Signed   By: Daniel  Entrikin M.D.    On: 12/23/2022 05:45    Cardiac Studies:  EKG: Dec 20, 2022: Normal sinus rhythm, 68 bpm, LAE, consider inferior infarct, TWI in inferior leads consider ischemia.  Compared to prior EKG first-degree AV block no longer present.   Dec 23, 2022: Sinus bradycardia, 57 bpm, TWI in inferior leads concerning for ischemia, without underlying injury pattern similar to prior tracings.   Dec 23, 2022: Sinus rhythm, 61 bpm, TWI in inferior leads concerning for ischemia, without underlying injury pattern.   Echocardiogram: 09/01/2018: LVEF 55 to 60%, grade 2 diastolic dysfunction, trivial AR, mild MR, mild left atrial enlargement.   December 03, 2022:  1. Left ventricular ejection fraction, by estimation, is 60 to 65%. The  left ventricle has normal function. There is mild concentric left  ventricular hypertrophy. Left ventricular diastolic parameters are  consistent with Grade II diastolic dysfunction  (pseudonormalization). On the 2-chamber contrast images there appears to  be a small segment of akinesis in the basilar to mid inferior wall. This  is not seen in any other images.   2. Right ventricular systolic function is normal. The right ventricular  size is normal. There is normal pulmonary artery systolic pressure.   3. Left atrial size was mildly dilated.   4. The mitral valve is normal in structure. Mild mitral valve  regurgitation. No evidence of mitral stenosis.   5. The aortic valve is tricuspid. There is mild calcification of the  aortic valve. Aortic valve regurgitation is trivial. No aortic stenosis is  present.   6. The inferior vena cava is normal in size with <50% respiratory  variability, suggesting right atrial pressure of 8 mmHg.    Stress Testing: MPI 05/18/2020            Nuclear stress EF: 72%. No wall motion abnormalities.            There was no ST segment deviation noted during stress.            The study is normal.            This is a low risk study. No   evidence  of ischemia or infarction. Prior CABG.   Heart Catheterization: December 03, 2022: LM: Normal LAD: Prox-mid 30% diffuse disease, distal 75% diffuse disease Lcx: Mid 75% diffuse disease RCA: Prox 80% diffuse disease, followed 100 mid ISR SVG-PDA: Patent SVG-diag: Could not be visualized   Temporary transvenous pacemaker placement.   Patient was very confused and kept attempting to sit up.  I did not feel leaving femoral sheath in place was safe.  Therefore, I decided to go ahead and place 5 French Mynx closure.   With transient hypotension, patient was very confused and disoriented.  Code stroke was called.  Patient was evaluated by neurology and thought to be less likely to have had stroke.  CT head was ordered out of abundance caution.  Patient is mental alertness improved with time.  She will be kept into heart ICU overnight for monitoring.  Temporary pacemaker will be removed tomorrow morning if no further use is needed.   Unusually prolonged procedure time due to intraprocedural hypotension, complete AV block requiring placement of temporary transvenous placement, disorientation requiring further neurological evaluation.   December 10, 2022: LM: Mild disease LAD: Prox-mid 30% diffuse disease, distal 75% diffuse disease Lcx: Prox 30%, mid 75% diffuse disease, followed by focal 90% stenosis RCA: Not engaged today. Known Prox 80% diffuse disease, followed 100 mid ISR SVG-PDA: Not engaged today. Known patent SVG-diag: Patent. No significant disease   LVEDP 17 mmHg   Successful percutaneous coronary intervention mid LCx        PTCA and stent placement with overlapping stents        2.0 X 18 mm and 2.25 X 15 Onyx drug-eluting stent        Post dilatation with 2.25 mm stent balloon and 2.5X15 mm [place] balloon up to 16 atm       Recent Results (from the past 43800 hour(s))  ECHOCARDIOGRAM COMPLETE   Collection Time: 12/03/22  2:23 PM  Result Value   Weight 2,400   Height 61   BP 121/55    Single Plane A2C EF 71.6   Single Plane A4C EF 74.9   Calc EF 72.7   S' Lateral 2.40   Area-P 1/2 3.08   Est EF 60 - 65%   Narrative      ECHOCARDIOGRAM REPORT       Patient Name:   Susan Barnett Date of Exam: 12/03/2022 Medical Rec #:  [id number]       Height:       61.0 in Accession #:    [id number]      Weight:       150.0 lb Date of Birth:  02/17/1946       BSA:          1.671 m Patient Age:    76 years        BP:           134/55 mmHg Patient Gender: F               HR:           63 bpm. Exam Location:  Inpatient  Procedure: 2D Echo, Cardiac Doppler, Color Doppler and Intracardiac            Opacification Agent  Indications:     elevated troponin   History:         Patient has prior history of Echocardiogram examinations. CAD,                    Prior CABG; Risk Factors:Diabetes and Hypertension.   Sonographer:     Lindsey Urban Referring Phys:  JUSTIN B HOWERTER Diagnosing Phys: Daniel Bensimhon MD  IMPRESSIONS    1. Left ventricular ejection fraction, by estimation, is 60 to 65%. The left ventricle has normal function. There is mild concentric left ventricular hypertrophy. Left ventricular diastolic parameters are consistent with Grade II diastolic dysfunction  (pseudonormalization). On the 2-chamber contrast images there appears to be a small segment of akinesis in the basilar to mid inferior wall. This is not seen in any other images.  2. Right ventricular systolic function is normal. The right ventricular size is normal. There is normal pulmonary artery systolic pressure.  3. Left atrial size was mildly dilated.  4. The mitral valve is normal in structure. Mild mitral valve regurgitation. No evidence of mitral stenosis.  5. The aortic valve is tricuspid. There is mild calcification of the aortic valve. Aortic valve regurgitation is trivial. No aortic stenosis is present.  6. The inferior vena cava is normal in size with <50% respiratory variability, suggesting right  atrial pressure of 8 mmHg.  FINDINGS  Left Ventricle: Left ventricular ejection fraction, by estimation, is 60 to 65%. The left ventricle has normal function. The left ventricle has no regional wall motion abnormalities. Definity contrast agent was given IV to delineate the left ventricular  endocardial borders. The left ventricular internal cavity size was normal in size. There is mild concentric left ventricular hypertrophy. Left ventricular diastolic parameters are consistent with Grade II diastolic dysfunction (pseudonormalization).  Right Ventricle: The right ventricular size is normal. No increase in right ventricular wall thickness. Right ventricular systolic function is normal. There is normal pulmonary artery systolic pressure. The tricuspid regurgitant velocity is 1.85 m/s, and  with an assumed right atrial pressure of 8 mmHg, the estimated right ventricular systolic pressure is 21.7 mmHg.  Left Atrium: Left atrial size was mildly dilated.  Right Atrium: Right atrial size was normal in size.  Pericardium: There is no evidence of pericardial effusion.  Mitral Valve: The mitral valve is normal in structure. Mild mitral valve regurgitation. No evidence of mitral valve stenosis.  Tricuspid Valve: The tricuspid valve is normal in structure. Tricuspid valve regurgitation is trivial. No evidence of tricuspid stenosis.  Aortic Valve: The aortic valve is tricuspid. There is mild calcification of the aortic valve. Aortic valve regurgitation is trivial. No aortic stenosis is present.  Pulmonic Valve: The pulmonic valve was normal in structure. Pulmonic valve regurgitation is mild. No evidence of pulmonic stenosis.  Aorta: The aortic root is normal in size and structure.  Venous: The inferior vena cava is normal in size with less than 50% respiratory variability, suggesting right atrial pressure of 8 mmHg.  IAS/Shunts: No atrial level shunt detected by color flow Doppler.    LEFT  VENTRICLE PLAX 2D LVIDd:         3.60 cm      Diastology LVIDs:         2.40 cm      LV e' medial:    4.03 cm/s LV PW:         1.10 cm      LV E/e' medial:  29.0 LV IVS:        1.20 cm      LV e' lateral:   7.51 cm/s LVOT diam:     1.90 cm      LV E/e' lateral: 15.6 LV SV:           62 LV SV Index:   37 LVOT Area:     2.84 cm   LV Volumes (MOD) LV vol d, MOD A2C: 89.0 ml LV vol d, MOD A4C: 115.0 ml LV vol s, MOD A2C: 25.3 ml LV vol s, MOD A4C: 28.9 ml LV SV MOD A2C:     63.7 ml LV SV MOD A4C:     115.0 ml LV SV MOD BP:      75.7 ml  RIGHT VENTRICLE            IVC RV Basal diam:  3.70 cm    IVC diam: 2.10 cm RV S prime:     9.36 cm/s TAPSE (M-mode): 1.8 cm  LEFT ATRIUM             Index        RIGHT ATRIUM           Index LA diam:        3.30 cm 1.97 cm/m   RA Area:     13.30 cm LA Vol (A2C):   40.1 ml 23.99 ml/m  RA Volume:   25.90 ml  15.50 ml/m LA Vol (A4C):   54.6 ml 32.67 ml/m LA Biplane Vol: 50.3 ml 30.09 ml/m  AORTIC VALVE LVOT Vmax:   89.00 cm/s LVOT Vmean:  59.200 cm/s LVOT VTI:    0.220 m   AORTA Ao Root diam: 2.80 cm Ao Asc diam:  3.40 cm  MITRAL VALVE                TRICUSPID VALVE MV Area (PHT): 3.08 cm     TR Peak grad:   13.7 mmHg MV Decel Time: 246 msec     TR Vmax:        185.00 cm/s MV E velocity: 117.00 cm/s MV A velocity: 101.00 cm/s  SHUNTS MV E/A ratio:  1.16         Systemic VTI:  0.22 m                             Systemic Diam: 1.90 cm  Daniel Bensimhon MD Electronically signed by Daniel Bensimhon MD Signature Date/Time: 12/03/2022/3:00:31 PM       Final (Updated)     *Note: Due to a large number of results and/or encounters for the requested time period, some results have not been displayed. A complete set of results can be found in Results Review.    Scheduled Meds:  acetaminophen  1,000 mg Oral QHS   atorvastatin  40 mg Oral Daily   cilostazol  50 mg Oral BID   cloNIDine  0.1 mg Oral QODAY   clopidogrel  75 mg Oral Daily    empagliflozin  10 mg Oral Daily   furosemide  20 mg Intravenous Daily   insulin aspart  0-9 Units Subcutaneous TID WC   insulin glargine-yfgn  40 Units Subcutaneous QHS   levothyroxine  75 mcg Oral QAC breakfast   loratadine  10 mg Oral Daily   losartan  100 mg Oral Daily   metoprolol tartrate  25 mg Oral BID   multivitamin with minerals  1 tablet Oral Daily   pantoprazole  40 mg Oral QAC breakfast   ranolazine  500 mg Oral BID   Continuous Infusions:  heparin 750 Units/hr (12/23/22 0921)   nitroGLYCERIN 10 mcg/min (12/24/22 0639)   PRN Meds:.acetaminophen, alum & mag hydroxide-simeth, nitroGLYCERIN, ondansetron (ZOFRAN) IV  Assessment & Plan: Susan   T Barnett is a 76 y.o. female patient with CAD status post CABG 2015 (SVG to diagonal and SVG to PDA) and s/p PCI to LCX (11/2022), hypertension, diabetes mellitus type 2, hyperlipidemia, OSA .   NSTEMI CAD status post 2v CABG (in 2015) with recent PCI to the LCx Chronic HFpEF Hypertension Hyperlipidemia, mixed. Insulin-dependent diabetes mellitus type 2. OSA  Plan:  NSTEMI Patient is having chest pain at rest despite being on nitro gtt EKG does not show myocardial injury pattern. But high sensitive troponins are up trended ruling her in for NSTEMI. We will proceed with limited echo to reevaluate LVEF and regional wall motion abnormalities. Trend troponins, continue on heparin gtt Continue on Plavix and Pletal. Continue Ranexa 500 mg p.o. twice daily. Continue home dose beta-blockers. Will plan for heart catheterization today   Chronic HFpEF: Stage B, NYHA class II Prior echocardiogram noted grade 2 diastolic dysfunction. BNP elevated but lower compared to prior levels Start Lasix 20 mg IV push daily. Strict I's and O's, daily weights. Will uptitrate GDMT as hemodynamics and laboratory values allow   Hypertension: Resume home meds   Insulin-dependent diabetes mellitus type 2: Resume home dose of Lantus and sliding  scale insulin.   Mixed hyperlipidemia: Continue statin therapy   OSA: Patient is encouraged to bring her own device from home for utilization.      Jahara Dail, DO 12/24/2022, 10:31 AM Office: [phone] Fax: [phone] Pager: [phone]  

## 2022-12-24 NOTE — Progress Notes (Signed)
ON-CALL CARDIOLOGY 12/24/22  Patient's name: Susan Barnett.   MRN: [id number].    DOB: Mar 06, 1946 Primary care provider: Johny Blamer, MD. Primary cardiologist: Tessa Lerner, DO, [facility]  Interaction regarding this patient's care today: Was paged by the RN that she woke up with substernal discomfort, intensity 5 out of 10, diaphoretic and dyspneic.  Vital signs at that time blood pressure 140/60, pulse 143 bpm.  Patient had refused sublingual nitroglycerin tablet - she stated "it did not work this morning."  Initial EKG (not transmitted in epic but in chart) noted sinus tachycardia with subtle ST elevations in the inferior leads with ST depressions in the high lateral lead (I and avL).   By the time she came back from the bathroom the chest pain had resolved.   Home dose of metoprolol at 50 mg p.o. twice daily, restarted on 25 mg p.o. twice daily (needed TVP during prior admission).   Repeat EKG notes sinus rhythm with TWI in inferior leads (lead II more pronounced) but no ST changes in inferior / lateral leads when compared to prior.   Patient is currently asymptomatic.   Spoke to interventional cardiologist Dr. Jacinto Halim over the phone.   Shared decision is start nitro drip for pain control, NPO, and re-discuss angiography in the morning.   Plan of care discussed w/ her RN Ms. Noreene Larsson Tsoutis.   Impression: NSTEMI   Delilah Shan [facility]  Pager: [phone] Office: [phone]

## 2022-12-24 NOTE — Progress Notes (Signed)
25F arterial sheath right groin removed.  Manual pressure held for 25 minutes.  Right groin site 0 pre and post pull.  Right DP pulse palpable post sheath pull.  Bedrest started at 1710.  Gauze and tegaderm applied to right groin.  Instructions reviewed with patient.

## 2022-12-24 NOTE — TOC Benefit Eligibility Note (Signed)
Patient Product/process development scientist completed.    The patient is currently admitted and upon discharge could be taking ranolazine (Ranexa) 500 mg tablets.  The current 30 day co-pay is $42.81.   The patient is insured through [facility] Part D   This test claim was processed through [facility]- copay amounts may vary at other pharmacies due to pharmacy/plan contracts, or as the patient moves through the different stages of their insurance plan.  Susan Barnett, CPHT Pharmacy Patient Advocate Specialist [facility] Patient Advocate Team Direct Number: [phone]  Fax: [phone]

## 2022-12-24 NOTE — Progress Notes (Signed)
ANTICOAGULATION CONSULT NOTE - Follow Up Consult  Pharmacy Consult for heparin Indication:  NSTEMI  Labs: Recent Labs    12/23/22 0518 12/23/22 0726 12/23/22 1152 12/23/22 1733 12/23/22 2045 12/24/22 0035  HGB 12.7  --   --   --   --   --   HCT 39.8  --   --   --   --   --   PLT 427*  --   --   --   --   --   HEPARINUNFRC  --   --   --  0.46  --  0.45  CREATININE 1.02*  --   --   --   --   --   TROPONINIHS 210*   < > 6,570*  --  4,621* 3,346*   < > = values in this interval not displayed.    Assessment/Plan:  77yo female remains therapeutic on heparin. Will continue infusion at current rate of 750 units/hr and monitor daily level.  Vernard Gambles, PharmD, BCPS 12/24/2022 2:02 AM

## 2022-12-24 NOTE — Progress Notes (Signed)
BP 89/54, patient is asymptomatic. Held PO losartan. Patient is pain free, so decreased rate of IV nitroglycerin. Dr. Melton Alar aware.

## 2022-12-24 NOTE — Interval H&P Note (Signed)
History and Physical Interval Note:  12/24/2022 2:42 PM  Susan Barnett  has presented today for surgery, with the diagnosis of nstemi.  The various methods of treatment have been discussed with the patient and family. After consideration of risks, benefits and other options for treatment, the patient has consented to  Procedure(s): LEFT HEART CATH AND CORS/GRAFTS ANGIOGRAPHY (N/A) as a surgical intervention.  The patient's history has been reviewed, patient examined, no change in status, stable for surgery.  I have reviewed the patient's chart and labs.  Questions were answered to the patient's satisfaction.    2016 Appropriate Use Criteria for Coronary Revascularization in Patients With Acute Coronary Syndrome NSTEMI/Unstable angina, stabilized patient at high risk Indication:  Revascularization by PCI or CABG of 1 or more arteries in a patient with NSTEMI or unstable angina with Stabilization after presentation High risk for clinical events A (7) Indication: 16; Score 7   Cadden Elizondo J Graeden Bitner

## 2022-12-24 NOTE — Telephone Encounter (Signed)
Pharmacy Patient Advocate Encounter  Insurance verification completed.    The patient is insured through [facility] Part D   The patient is currently admitted and ran test claims for the following: ranolazine (Ranexa) .  Copays and coinsurance results were relayed to Inpatient clinical team.

## 2022-12-24 NOTE — Progress Notes (Signed)
Subjective:  Patient seen and examined at bedside, she is still having chest pain at rest despite being on nitro drip. She is agreeable for going for repeat heart catheterization today. Her chest pain is a little better with nitro but it is still present at rest.  Intake/Output from previous day:  I/O last 3 completed shifts: In: 441.2 [P.O.:240; I.V.:201.2] Out: -  No intake/output data recorded. Net IO Since Admission: 441.21 mL [12/24/22 1031]  Blood pressure (!) 140/69, pulse 79, temperature 98 F (36.7 C), temperature source Oral, resp. rate 20, height 5\' 1"  (1.549 m), weight 65.3 kg, SpO2 100 %. Physical Exam Vitals reviewed.  HENT:     Head: Normocephalic and atraumatic.  Cardiovascular:     Rate and Rhythm: Normal rate and regular rhythm.     Heart sounds: No murmur heard. Pulmonary:     Effort: Pulmonary effort is normal.     Breath sounds: Normal breath sounds.  Abdominal:     General: Bowel sounds are normal.  Musculoskeletal:     Right lower leg: No edema.     Left lower leg: No edema.  Skin:    General: Skin is warm and dry.  Neurological:     Mental Status: She is alert.     Lab Results: Lab Results  Component Value Date   NA 134 (L) 12/23/2022   K 3.7 12/23/2022   CO2 22 12/23/2022   GLUCOSE 250 (H) 12/23/2022   BUN 16 12/23/2022   CREATININE 1.02 (H) 12/23/2022   CALCIUM 9.8 12/23/2022   EGFR 89 11/30/2022   GFRNONAA 57 (L) 12/23/2022    BNP (last 3 results) Recent Labs    12/03/22 0012 12/10/22 1605 12/23/22 0518  BNP 73.3 736.8* 129.6*    ProBNP (last 3 results) Recent Labs    11/30/22 1154  PROBNP 793*      Latest Ref Rng & Units 12/23/2022    5:18 AM 12/11/2022    2:25 AM 12/10/2022    3:45 PM  BMP  Glucose 70 - 99 mg/dL 330  076  226   BUN 8 - 23 mg/dL 16  10  11    Creatinine 0.44 - 1.00 mg/dL 3.33  5.45  6.25   Sodium 135 - 145 mmol/L 134  134  138   Potassium 3.5 - 5.1 mmol/L 3.7  2.8  3.8   Chloride 98 - 111 mmol/L 100  101   103   CO2 22 - 32 mmol/L 22  22  23    Calcium 8.9 - 10.3 mg/dL 9.8  8.7  9.3       Latest Ref Rng & Units 12/03/2022    5:40 AM 12/03/2022   12:12 AM 12/21/2019    3:45 AM  Hepatic Function  Total Protein 6.5 - 8.1 g/dL 6.7  6.2  6.9   Albumin 3.5 - 5.0 g/dL 3.5  3.3  3.7   AST 15 - 41 U/L 25  21  18    ALT 0 - 44 U/L 18  16  18    Alk Phosphatase 38 - 126 U/L 40  44  69   Total Bilirubin 0.3 - 1.2 mg/dL 0.6  0.4  0.6       Latest Ref Rng & Units 12/23/2022    5:18 AM 12/11/2022    2:25 AM 12/10/2022    3:45 PM  CBC  WBC 4.0 - 10.5 K/uL 13.3  14.2  13.3   Hemoglobin 12.0 - 15.0 g/dL 63.8  11.9  10.9   Hematocrit 36.0 - 46.0 % 39.8  36.0  33.7   Platelets 150 - 400 K/uL 427  391  405    Lipid Panel     Component Value Date/Time   CHOL 114 12/05/2022 1057   TRIG 149 12/05/2022 1057   HDL 39 (L) 12/05/2022 1057   CHOLHDL 2.9 12/05/2022 1057   VLDL 30 12/05/2022 1057   LDLCALC 45 12/05/2022 1057   LDLDIRECT 51 12/05/2022 1057   Cardiac Panel (last 3 results) No results for input(s): "CKTOTAL", "CKMB", "TROPONINI", "RELINDX" in the last 72 hours.  HEMOGLOBIN A1C Lab Results  Component Value Date   HGBA1C 8.7 (H) 12/03/2022   MPG 202.99 12/03/2022   TSH Recent Labs    12/03/22 0540  TSH 0.381   Imaging: Imaging results have been reviewed and DG Chest Port 1 View  Result Date: 12/23/2022 CLINICAL DATA:  77 year old female with history of shortness of breath. EXAM: PORTABLE CHEST 1 VIEW COMPARISON:  Chest x-ray 12/10/2022. FINDINGS: Lung volumes are normal. No consolidative airspace disease. No pleural effusions. No pneumothorax. No pulmonary nodule or mass noted. Pulmonary vasculature and the cardiomediastinal silhouette are within normal limits. Atherosclerosis in the thoracic aorta. Status post median sternotomy for CABG. IMPRESSION: 1.  No radiographic evidence of acute cardiopulmonary disease. 2. Aortic atherosclerosis. Electronically Signed   By: Trudie Reed M.D.    On: 12/23/2022 05:45    Cardiac Studies:  EKG: Dec 20, 2022: Normal sinus rhythm, 68 bpm, LAE, consider inferior infarct, TWI in inferior leads consider ischemia.  Compared to prior EKG first-degree AV block no longer present.   Dec 23, 2022: Sinus bradycardia, 57 bpm, TWI in inferior leads concerning for ischemia, without underlying injury pattern similar to prior tracings.   Dec 23, 2022: Sinus rhythm, 61 bpm, TWI in inferior leads concerning for ischemia, without underlying injury pattern.   Echocardiogram: 09/01/2018: LVEF 55 to 60%, grade 2 diastolic dysfunction, trivial AR, mild MR, mild left atrial enlargement.   December 03, 2022:  1. Left ventricular ejection fraction, by estimation, is 60 to 65%. The  left ventricle has normal function. There is mild concentric left  ventricular hypertrophy. Left ventricular diastolic parameters are  consistent with Grade II diastolic dysfunction  (pseudonormalization). On the 2-chamber contrast images there appears to  be a small segment of akinesis in the basilar to mid inferior wall. This  is not seen in any other images.   2. Right ventricular systolic function is normal. The right ventricular  size is normal. There is normal pulmonary artery systolic pressure.   3. Left atrial size was mildly dilated.   4. The mitral valve is normal in structure. Mild mitral valve  regurgitation. No evidence of mitral stenosis.   5. The aortic valve is tricuspid. There is mild calcification of the  aortic valve. Aortic valve regurgitation is trivial. No aortic stenosis is  present.   6. The inferior vena cava is normal in size with <50% respiratory  variability, suggesting right atrial pressure of 8 mmHg.    Stress Testing: MPI 05/18/2020            Nuclear stress EF: 72%. No wall motion abnormalities.            There was no ST segment deviation noted during stress.            The study is normal.            This is a low risk study. No  evidence  of ischemia or infarction. Prior CABG.   Heart Catheterization: December 03, 2022: LM: Normal LAD: Prox-mid 30% diffuse disease, distal 75% diffuse disease Lcx: Mid 75% diffuse disease RCA: Prox 80% diffuse disease, followed 100 mid ISR SVG-PDA: Patent SVG-diag: Could not be visualized   Temporary transvenous pacemaker placement.   Patient was very confused and kept attempting to sit up.  I did not feel leaving femoral sheath in place was safe.  Therefore, I decided to go ahead and place 5 [place] Mynx closure.   With transient hypotension, patient was very confused and disoriented.  Code stroke was called.  Patient was evaluated by neurology and thought to be less likely to have had stroke.  CT head was ordered out of abundance caution.  Patient is mental alertness improved with time.  She will be kept into heart ICU overnight for monitoring.  Temporary pacemaker will be removed tomorrow morning if no further use is needed.   Unusually prolonged procedure time due to intraprocedural hypotension, complete AV block requiring placement of temporary transvenous placement, disorientation requiring further neurological evaluation.   December 10, 2022: LM: Mild disease LAD: Prox-mid 30% diffuse disease, distal 75% diffuse disease Lcx: Prox 30%, mid 75% diffuse disease, followed by focal 90% stenosis RCA: Not engaged today. Known Prox 80% diffuse disease, followed 100 mid ISR SVG-PDA: Not engaged today. Known patent SVG-diag: Patent. No significant disease   LVEDP 17 mmHg   Successful percutaneous coronary intervention mid LCx        PTCA and stent placement with overlapping stents        2.0 X 18 mm and 2.25 X 15 Onyx drug-eluting stent        Post dilatation with 2.25 mm stent balloon and 2.5X15 mm [place] balloon up to 16 atm       Recent Results (from the past 16109 hour(s))  ECHOCARDIOGRAM COMPLETE   Collection Time: 12/03/22  2:23 PM  Result Value   Weight 2,400   Height 61   BP 121/55    Single Plane A2C EF 71.6   Single Plane A4C EF 74.9   Calc EF 72.7   S' Lateral 2.40   Area-P 1/2 3.08   Est EF 60 - 65%   Narrative      ECHOCARDIOGRAM REPORT       Patient Name:   AYRIANNA MCDERMITT Date of Exam: 12/03/2022 Medical Rec #:  [id number]       Height:       61.0 in Accession #:    [id number]      Weight:       150.0 lb Date of Birth:  1946/06/25       BSA:          1.671 m Patient Age:    76 years        BP:           134/55 mmHg Patient Gender: F               HR:           63 bpm. Exam Location:  Inpatient  Procedure: 2D Echo, Cardiac Doppler, Color Doppler and Intracardiac            Opacification Agent  Indications:     elevated troponin   History:         Patient has prior history of Echocardiogram examinations. CAD,  Prior CABG; Risk Factors:Diabetes and Hypertension.   Sonographer:     Mike Gip Referring Phys:  Angie Fava Diagnosing Phys: Arvilla Meres MD  IMPRESSIONS    1. Left ventricular ejection fraction, by estimation, is 60 to 65%. The left ventricle has normal function. There is mild concentric left ventricular hypertrophy. Left ventricular diastolic parameters are consistent with Grade II diastolic dysfunction  (pseudonormalization). On the 2-chamber contrast images there appears to be a small segment of akinesis in the basilar to mid inferior wall. This is not seen in any other images.  2. Right ventricular systolic function is normal. The right ventricular size is normal. There is normal pulmonary artery systolic pressure.  3. Left atrial size was mildly dilated.  4. The mitral valve is normal in structure. Mild mitral valve regurgitation. No evidence of mitral stenosis.  5. The aortic valve is tricuspid. There is mild calcification of the aortic valve. Aortic valve regurgitation is trivial. No aortic stenosis is present.  6. The inferior vena cava is normal in size with <50% respiratory variability, suggesting right  atrial pressure of 8 mmHg.  FINDINGS  Left Ventricle: Left ventricular ejection fraction, by estimation, is 60 to 65%. The left ventricle has normal function. The left ventricle has no regional wall motion abnormalities. Definity contrast agent was given IV to delineate the left ventricular  endocardial borders. The left ventricular internal cavity size was normal in size. There is mild concentric left ventricular hypertrophy. Left ventricular diastolic parameters are consistent with Grade II diastolic dysfunction (pseudonormalization).  Right Ventricle: The right ventricular size is normal. No increase in right ventricular wall thickness. Right ventricular systolic function is normal. There is normal pulmonary artery systolic pressure. The tricuspid regurgitant velocity is 1.85 m/s, and  with an assumed right atrial pressure of 8 mmHg, the estimated right ventricular systolic pressure is 21.7 mmHg.  Left Atrium: Left atrial size was mildly dilated.  Right Atrium: Right atrial size was normal in size.  Pericardium: There is no evidence of pericardial effusion.  Mitral Valve: The mitral valve is normal in structure. Mild mitral valve regurgitation. No evidence of mitral valve stenosis.  Tricuspid Valve: The tricuspid valve is normal in structure. Tricuspid valve regurgitation is trivial. No evidence of tricuspid stenosis.  Aortic Valve: The aortic valve is tricuspid. There is mild calcification of the aortic valve. Aortic valve regurgitation is trivial. No aortic stenosis is present.  Pulmonic Valve: The pulmonic valve was normal in structure. Pulmonic valve regurgitation is mild. No evidence of pulmonic stenosis.  Aorta: The aortic root is normal in size and structure.  Venous: The inferior vena cava is normal in size with less than 50% respiratory variability, suggesting right atrial pressure of 8 mmHg.  IAS/Shunts: No atrial level shunt detected by color flow Doppler.    LEFT  VENTRICLE PLAX 2D LVIDd:         3.60 cm      Diastology LVIDs:         2.40 cm      LV e' medial:    4.03 cm/s LV PW:         1.10 cm      LV E/e' medial:  29.0 LV IVS:        1.20 cm      LV e' lateral:   7.51 cm/s LVOT diam:     1.90 cm      LV E/e' lateral: 15.6 LV SV:  62 LV SV Index:   37 LVOT Area:     2.84 cm   LV Volumes (MOD) LV vol d, MOD A2C: 89.0 ml LV vol d, MOD A4C: 115.0 ml LV vol s, MOD A2C: 25.3 ml LV vol s, MOD A4C: 28.9 ml LV SV MOD A2C:     63.7 ml LV SV MOD A4C:     115.0 ml LV SV MOD BP:      75.7 ml  RIGHT VENTRICLE            IVC RV Basal diam:  3.70 cm    IVC diam: 2.10 cm RV S prime:     9.36 cm/s TAPSE (M-mode): 1.8 cm  LEFT ATRIUM             Index        RIGHT ATRIUM           Index LA diam:        3.30 cm 1.97 cm/m   RA Area:     13.30 cm LA Vol (A2C):   40.1 ml 23.99 ml/m  RA Volume:   25.90 ml  15.50 ml/m LA Vol (A4C):   54.6 ml 32.67 ml/m LA Biplane Vol: 50.3 ml 30.09 ml/m  AORTIC VALVE LVOT Vmax:   89.00 cm/s LVOT Vmean:  59.200 cm/s LVOT VTI:    0.220 m   AORTA Ao Root diam: 2.80 cm Ao Asc diam:  3.40 cm  MITRAL VALVE                TRICUSPID VALVE MV Area (PHT): 3.08 cm     TR Peak grad:   13.7 mmHg MV Decel Time: 246 msec     TR Vmax:        185.00 cm/s MV E velocity: 117.00 cm/s MV A velocity: 101.00 cm/s  SHUNTS MV E/A ratio:  1.16         Systemic VTI:  0.22 m                             Systemic Diam: 1.90 cm  Arvilla Meres MD Electronically signed by Arvilla Meres MD Signature Date/Time: 12/03/2022/3:00:31 PM       Final (Updated)     *Note: Due to a large number of results and/or encounters for the requested time period, some results have not been displayed. A complete set of results can be found in Results Review.    Scheduled Meds:  acetaminophen  1,000 mg Oral QHS   atorvastatin  40 mg Oral Daily   cilostazol  50 mg Oral BID   cloNIDine  0.1 mg Oral QODAY   clopidogrel  75 mg Oral Daily    empagliflozin  10 mg Oral Daily   furosemide  20 mg Intravenous Daily   insulin aspart  0-9 Units Subcutaneous TID WC   insulin glargine-yfgn  40 Units Subcutaneous QHS   levothyroxine  75 mcg Oral QAC breakfast   loratadine  10 mg Oral Daily   losartan  100 mg Oral Daily   metoprolol tartrate  25 mg Oral BID   multivitamin with minerals  1 tablet Oral Daily   pantoprazole  40 mg Oral QAC breakfast   ranolazine  500 mg Oral BID   Continuous Infusions:  heparin 750 Units/hr (12/23/22 0921)   nitroGLYCERIN 10 mcg/min (12/24/22 0639)   PRN Meds:.acetaminophen, alum & mag hydroxide-simeth, nitroGLYCERIN, ondansetron (ZOFRAN) IV  Assessment & Plan: Susan  DAZHA Barnett is a 77 y.o. female patient with CAD status post CABG 2015 (SVG to diagonal and SVG to PDA) and s/p PCI to LCX (11/2022), hypertension, diabetes mellitus type 2, hyperlipidemia, OSA .   NSTEMI CAD status post 2v CABG (in 2015) with recent PCI to the LCx Chronic HFpEF Hypertension Hyperlipidemia, mixed. Insulin-dependent diabetes mellitus type 2. OSA  Plan:  NSTEMI Patient is having chest pain at rest despite being on nitro gtt EKG does not show myocardial injury pattern. But high sensitive troponins are up trended ruling her in for NSTEMI. We will proceed with limited echo to reevaluate LVEF and regional wall motion abnormalities. Trend troponins, continue on heparin gtt Continue on Plavix and Pletal. Continue Ranexa 500 mg p.o. twice daily. Continue home dose beta-blockers. Will plan for heart catheterization today   Chronic HFpEF: Stage B, NYHA class II Prior echocardiogram noted grade 2 diastolic dysfunction. BNP elevated but lower compared to prior levels Start Lasix 20 mg IV push daily. Strict I's and O's, daily weights. Will uptitrate GDMT as hemodynamics and laboratory values allow   Hypertension: Resume home meds   Insulin-dependent diabetes mellitus type 2: Resume home dose of Lantus and sliding  scale insulin.   Mixed hyperlipidemia: Continue statin therapy   OSA: Patient is encouraged to bring her own device from home for utilization.      Clotilde Dieter, DO 12/24/2022, 10:31 AM Office: [phone] Fax: [phone] Pager: [phone]

## 2022-12-25 ENCOUNTER — Other Ambulatory Visit (HOSPITAL_COMMUNITY): Payer: Self-pay

## 2022-12-25 ENCOUNTER — Encounter (HOSPITAL_COMMUNITY): Payer: Self-pay | Admitting: Cardiology

## 2022-12-25 ENCOUNTER — Encounter: Payer: Self-pay | Admitting: Hematology and Oncology

## 2022-12-25 LAB — BASIC METABOLIC PANEL
Anion gap: 10 (ref 5–15)
BUN: 18 mg/dL (ref 8–23)
CO2: 19 mmol/L — ABNORMAL LOW (ref 22–32)
Calcium: 8.7 mg/dL — ABNORMAL LOW (ref 8.9–10.3)
Chloride: 103 mmol/L (ref 98–111)
Creatinine, Ser: 0.86 mg/dL (ref 0.44–1.00)
GFR, Estimated: >60 mL/min (ref >60)
Glucose, Bld: 161 mg/dL — ABNORMAL HIGH (ref 70–99)
Potassium: 4.9 mmol/L (ref 3.5–5.1)
Sodium: 132 mmol/L — ABNORMAL LOW (ref 135–145)

## 2022-12-25 LAB — CBC
HCT: 38.5 % (ref 36.0–46.0)
Hemoglobin: 12.2 g/dL (ref 12.0–15.0)
MCH: 28.6 pg (ref 26.0–34.0)
MCHC: 31.7 g/dL (ref 30.0–36.0)
MCV: 90.2 fL (ref 80.0–100.0)
Platelets: 356 10*3/uL (ref 150–400)
RBC: 4.27 MIL/uL (ref 3.87–5.11)
RDW: 13.6 % (ref 11.5–15.5)
WBC: 10.4 10*3/uL (ref 4.0–10.5)
nRBC: 0 % (ref 0.0–0.2)

## 2022-12-25 LAB — GLUCOSE, CAPILLARY
Glucose-Capillary: 166 mg/dL — ABNORMAL HIGH (ref 70–99)
Glucose-Capillary: 154 mg/dL — ABNORMAL HIGH (ref 70–99)

## 2022-12-25 MED ORDER — CILOSTAZOL 50 MG PO TABS: 50.0000 mg | ORAL_TABLET | Freq: Two times a day (BID) | ORAL | 3 refills | Status: DC

## 2022-12-25 MED ORDER — METOPROLOL TARTRATE 50 MG PO TABS
50.0000 mg | ORAL_TABLET | Freq: Two times a day (BID) | ORAL | 1 refills | Status: DC
  Filled 2022-12-25: qty 60, 30d supply, fill #0

## 2022-12-25 MED ORDER — CILOSTAZOL 50 MG PO TABS
50.0000 mg | ORAL_TABLET | Freq: Two times a day (BID) | ORAL | 3 refills | Status: DC
  Filled 2022-12-25: qty 180, 90d supply, fill #0

## 2022-12-25 MED ORDER — ENOXAPARIN SODIUM 40 MG/0.4ML IJ SOSY
40.0000 mg | PREFILLED_SYRINGE | INTRAMUSCULAR | Status: DC
  Administered 2022-12-25: 40 mg via SUBCUTANEOUS
  Filled 2022-12-25: qty 0.4

## 2022-12-25 MED ORDER — RANOLAZINE ER 500 MG PO TB12
500.0000 mg | ORAL_TABLET | Freq: Two times a day (BID) | ORAL | 0 refills | Status: DC
  Filled 2022-12-25: qty 60, 30d supply, fill #0

## 2022-12-25 MED ORDER — CLOPIDOGREL BISULFATE 75 MG PO TABS
75.0000 mg | ORAL_TABLET | Freq: Every day | ORAL | 3 refills | Status: DC
  Filled 2022-12-25: qty 30, 30d supply, fill #0

## 2022-12-25 MED FILL — Verapamil HCl IV Soln 2.5 MG/ML: INTRAVENOUS | Qty: 2 | Status: AC

## 2022-12-25 MED FILL — Heparin Sodium (Porcine) Inj 1000 Unit/ML: INTRAMUSCULAR | Qty: 10 | Status: AC

## 2022-12-25 NOTE — Progress Notes (Addendum)
Discharge instructions reviewed with and her husband.  Copy of instructions given to pt. [facility] filled 3 scripts and were delivered to pt's room, pt also informed of one script sent to her home pharmacy (pt states [facility] pharmacy did not have this med and script was sent to her home pharmacy), pt states she will pick up on her way home.  Pt requested information on CHF, Living Better with Heart Failure booklet given to pt. When to call the MD due to weight gain reviewed with pt as on her discharge instructions.  Pt to be d/c'd via wheelchair with belongings, with her husband.        To be escorted by staff/hospital volunteer.   Annice Needy, RN SWOT

## 2022-12-25 NOTE — TOC Initial Note (Signed)
Transition of Care [facility]) - Initial/Assessment Note    Patient Details  Name: Susan Barnett MRN: [id number] Date of Birth: Dec 23, 1945  Transition of [facility]) CM/SW Contact:    Ronny Bacon, RN Phone Number: 12/25/2022, 10:23 AM  Clinical Narrative:  Risk for readmission assessment completed.  Patient presented from home with chest pain. Spoke with patient at bedside. Patient lives in 1 story home with Spouse that has 3 steps with a railing on each side that leads into home. Patient is connected to care through [facility] for PT and OT. Patient reports sees PT twice a week and OT once a week. Patient has walker, shower chair, shower bench, and grab bar at home currently. Patient inquired about getting into Cardiac rehab. They called her when she was home but could not talk to them at that time due to [facility] being at the house. Spoke with Duwaine Maxin with [facility] to inform of patient admission and expected date of discharge. MD to place resumption HH orders. No further TOC needs noted at this time.             Expected Discharge Plan: Home w Home Health Services Barriers to Discharge: No Barriers Identified   Patient Goals and CMS Choice Patient states their goals for this hospitalization and ongoing recovery are:: to return home   Choice offered to / list presented to : NA      Expected Discharge Plan and Services In-house Referral: NA Discharge Planning Services: CM Consult Post Acute Care Choice: Home Health Living arrangements for the past 2 months: Single Family Home                           HH Arranged: PT, OT ([facility]) HH Agency: [facility] ([facility]) Date HH Agency Contacted: 12/25/22 Time HH Agency Contacted: 1018 Representative spoke with at [facility] Agency: Morrie Sheldon  Prior Living Arrangements/Services Living arrangements for the past 2 months: Single Family Home Lives with:: Spouse Patient language and need for interpreter reviewed:: Yes Do  you feel safe going back to the place where you live?: Yes      Need for Family Participation in Patient Care: Yes (Comment) Care giver support system in place?: Yes (comment) Current home services: DME (Rolling walker, shower bench, grab bars, shower chair) Criminal Activity/Legal Involvement Pertinent to Current Situation/Hospitalization: No - Comment as needed  Activities of Daily Living Home Assistive Devices/Equipment: Walker (specify type), CBG Meter ADL Screening (condition at time of admission) Patient's cognitive ability adequate to safely complete daily activities?: Yes Is the patient deaf or have difficulty hearing?: No Does the patient have difficulty seeing, even when wearing glasses/contacts?: No Does the patient have difficulty concentrating, remembering, or making decisions?: No Patient able to express need for assistance with ADLs?: Yes Does the patient have difficulty dressing or bathing?: No Independently performs ADLs?: Yes (appropriate for developmental age) Does the patient have difficulty walking or climbing stairs?: Yes Weakness of Legs: None Weakness of Arms/Hands: None  Permission Sought/Granted Permission sought to share information with : Case Manager, Oceanographer granted to share information with : Yes, Verbal Permission Granted     Permission granted to share info w AGENCY: [facility]        Emotional Assessment Appearance:: Appears stated age Attitude/Demeanor/Rapport: Engaged Affect (typically observed): Appropriate Orientation: : Oriented to Self, Oriented to Place, Oriented to  Time, Oriented to Situation Alcohol / Substance Use: Not Applicable Psych Involvement:  No (comment)  Admission diagnosis:  Elevated troponin [R79.89] NSTEMI (non-ST elevated myocardial infarction) (HCC) [I21.4] Chest pain, unspecified type [R07.9] Patient Active Problem List   Diagnosis Date Noted   Elevated troponin 12/23/2022   Hx of  heart artery stent 12/23/2022   Benign hypertension 12/23/2022   Chronic heart failure with preserved ejection fraction (HFpEF) (HCC) 12/23/2022   Coronary artery disease of native artery of native heart with stable angina pectoris (HCC) 12/23/2022   Unstable angina (HCC) 12/10/2022   Transient complete heart block (HCC) 12/05/2022   NSTEMI (non-ST elevated myocardial infarction) (HCC) 12/03/2022   Chest pain of uncertain etiology 12/03/2022   Hypokalemia 12/03/2022   Chronic diastolic CHF (congestive heart failure) (HCC) 12/03/2022   Hx of CABG 12/03/2022   DM type 2 with diabetic mixed hyperlipidemia (HCC) 12/03/2022   Sleep apnea in adult 12/03/2022   Atherosclerosis of native coronary artery of native heart with unstable angina pectoris (HCC) 12/03/2022   Hyponatremia 12/18/2019   Iron deficiency anemia 08/20/2017   Obstructive sleep apnea 05/16/2017   Genetic testing 04/12/2017   Malignant neoplasm of upper-outer quadrant of left breast in female, estrogen receptor positive (HCC) 10/15/2016   Hyperlipidemia 07/14/2015   Coronary artery disease involving native coronary artery of native heart with unstable angina pectoris (HCC) 06/14/2014   Lateral meniscal tear 02/23/2014   GASTRIC POLYP 11/19/2007   Acquired hypothyroidism 11/19/2007   Type 2 diabetes mellitus with hyperglycemia, with long-term current use of insulin (HCC) 11/19/2007   Primary hypertension 11/19/2007   GERD (gastroesophageal reflux disease) 11/19/2007   HIATAL HERNIA 11/19/2007   RECTAL BLEEDING 11/19/2007   PCP:  Johny Blamer, MD Pharmacy:   [facility] [address], [place] - [address] [place] [address] Phone: [phone] Fax: [phone]  [facility] [address] [place] [address] Phone: [phone] Fax: [phone]  [facility] - [place], [place] - [address] [place] [address] Phone: [phone] Fax: [phone]     Social Determinants of Health (SDOH) Social History: SDOH Screenings   Food Insecurity: No Food Insecurity (12/23/2022)  Housing: Low Risk  (12/23/2022)  Transportation Needs: No Transportation Needs (12/23/2022)  Utilities: Not At Risk (12/23/2022)  Tobacco Use: Low Risk  (12/25/2022)   SDOH Interventions:     Readmission Risk Interventions    12/25/2022   10:11 AM 12/11/2022    9:47 AM  Readmission Risk Prevention Plan  Transportation Screening Complete Complete  PCP or Specialist Appt within 3-5 Days Complete   HRI or Home Care Consult Complete Complete  Social Work Consult for Recovery Care Planning/Counseling Complete Complete  Palliative Care Screening Not Applicable Not Applicable  Medication Review Oceanographer) Complete Complete

## 2022-12-25 NOTE — Discharge Summary (Signed)
Physician Discharge Summary  Patient ID: Susan Barnett MRN: [id number] DOB/AGE: 78-May-1947 78 y.o.  Admit date: 12/23/2022 Discharge date: 12/25/2022  Primary Discharge Diagnosis: NSTEMI  Secondary Discharge Diagnosis: Chronic HFpEF. Hypertension. Insulin-dependent diabetes mellitus type 2. Hyperlipidemia. OSA  Hospital Course:   77 y.o. Caucasian female  with CAD status post CABG 2015 (SVG to diagonal and SVG to PDA) and s/p PCI to LCX (11/2022), hypertension, diabetes mellitus type 2, hyperlipidemia, OSA .   Presented to the hospital with symptoms concerning with anginal equivalents but less intense.  Based on her symptoms, EKGs, and cardiac biomarkers she ruled in for NSTEMI.  Patient was started on IV heparin drip per ACS protocol for 48 hours.  She would have intermittent chest pain despite being on nitro and heparin drip.  Due to ongoing angina despite being on medical therapy the shared decision was to proceed with angiography to evaluate for stent patency and progression of disease.  However, no new obstructive disease was noted and no interventions were performed.  She was transitioned from Brilinta to Plavix/Pletal due to shortness of breath.  Her antianginal therapies were uptitrated.  Ranexa added and metoprolol uptitrated.  This morning she did not have any chest pain and was eager to go home.  Discharge Exam: Temp:  [97.6 F (36.4 C)-98 F (36.7 C)] 98 F (36.7 C) (05/14 0551) Pulse Rate:  [66-95] 95 (05/14 0904) Cardiac Rhythm: Normal sinus rhythm (05/14 0827) Resp:  [12-20] 16 (05/14 0551) BP: (105-149)/(50-68) 123/55 (05/14 0904) SpO2:  [94 %-100 %] 97 % (05/13 2328) Weight:  [67.5 kg] 67.5 kg (05/14 0551)  Today's Vitals   12/24/22 2032 12/24/22 2328 12/25/22 0551 12/25/22 0904  BP: (!) 119/53 (!) 126/50 118/61 (!) 123/55  Pulse: 95 94 91 95  Resp: 18 16 16    Temp: 97.6 F (36.4 C) 97.6 F (36.4 C) 98 F (36.7 C)   TempSrc: Oral Oral Oral   SpO2:  97% 97%    Weight:   67.5 kg   Height:      PainSc:       Body mass index is 28.13 kg/m.  Last Weight  Most recent update: 12/25/2022  5:52 AM    Weight  67.5 kg (148 lb 14.4 oz)            Physical Exam  Constitutional: No distress.  Age appropriate, hemodynamically stable.   Neck: No JVD present.  Cardiovascular: Normal rate, regular rhythm, S1 normal, S2 normal, intact distal pulses and normal pulses. Exam reveals no gallop, no S3 and no S4.  No murmur heard. Pulmonary/Chest: Effort normal and breath sounds normal. No stridor. She has no wheezes. She has no rales.  Abdominal: Soft. Bowel sounds are normal. She exhibits no distension. There is no abdominal tenderness.  Musculoskeletal:        General: No edema.     Cervical back: Neck supple.  Neurological: She is alert and oriented to person, place, and time. She has intact cranial nerves (2-12).  Skin: Skin is warm and moist.   Recommendations on discharge:   #1 uptitration of GDMT and antianginal therapy #2 started on Ranexa, up titration of metoprolol #3 transitioned from Brilinta to Plavix and Pletal due to shortness of breath despite exposure to caffeinated beverages. #4 currently in the process of weaning off clonidine #5 will need labs prior to next office visit and EKG on arrival #6 patient is educated that she does have stable angina and that she should be more  concerned for symptoms that are increasing in intensity frequency or duration.  CARDIAC DATABASE: EKG: Dec 20, 2022: Normal sinus rhythm, 68 bpm, LAE, consider inferior infarct, TWI in inferior leads consider ischemia.  Compared to prior EKG first-degree AV block no longer present.   Dec 23, 2022: Sinus bradycardia, 57 bpm, TWI in inferior leads concerning for ischemia, without underlying injury pattern similar to prior tracings.   Dec 23, 2022: Sinus rhythm, 61 bpm, TWI in inferior leads concerning for ischemia, without underlying injury pattern.    Echocardiogram: 09/01/2018: LVEF 55 to 60%, grade 2 diastolic dysfunction, trivial AR, mild MR, mild left atrial enlargement.   December 03, 2022:  1. Left ventricular ejection fraction, by estimation, is 60 to 65%. The  left ventricle has normal function. There is mild concentric left  ventricular hypertrophy. Left ventricular diastolic parameters are  consistent with Grade II diastolic dysfunction  (pseudonormalization). On the 2-chamber contrast images there appears to  be a small segment of akinesis in the basilar to mid inferior wall. This  is not seen in any other images.   2. Right ventricular systolic function is normal. The right ventricular  size is normal. There is normal pulmonary artery systolic pressure.   3. Left atrial size was mildly dilated.   4. The mitral valve is normal in structure. Mild mitral valve  regurgitation. No evidence of mitral stenosis.   5. The aortic valve is tricuspid. There is mild calcification of the  aortic valve. Aortic valve regurgitation is trivial. No aortic stenosis is  present.   6. The inferior vena cava is normal in size with <50% respiratory  variability, suggesting right atrial pressure of 8 mmHg.    Stress Testing: MPI 05/18/2020            Nuclear stress EF: 72%. No wall motion abnormalities.            There was no ST segment deviation noted during stress.            The study is normal.            This is a low risk study. No evidence of ischemia or infarction. Prior CABG.   Heart Catheterization: December 03, 2022: LM: Normal LAD: Prox-mid 30% diffuse disease, distal 75% diffuse disease Lcx: Mid 75% diffuse disease RCA: Prox 80% diffuse disease, followed 100 mid ISR SVG-PDA: Patent SVG-diag: Could not be visualized   Temporary transvenous pacemaker placement.   Patient was very confused and kept attempting to sit up.  I did not feel leaving femoral sheath in place was safe.  Therefore, I decided to go ahead and place 5  [place] Mynx closure.   With transient hypotension, patient was very confused and disoriented.  Code stroke was called.  Patient was evaluated by neurology and thought to be less likely to have had stroke.  CT head was ordered out of abundance caution.  Patient is mental alertness improved with time.  She will be kept into heart ICU overnight for monitoring.  Temporary pacemaker will be removed tomorrow morning if no further use is needed.   Unusually prolonged procedure time due to intraprocedural hypotension, complete AV block requiring placement of temporary transvenous placement, disorientation requiring further neurological evaluation.   December 10, 2022: LM: Mild disease LAD: Prox-mid 30% diffuse disease, distal 75% diffuse disease Lcx: Prox 30%, mid 75% diffuse disease, followed by focal 90% stenosis RCA: Not engaged today. Known Prox 80% diffuse disease, followed 100 mid ISR SVG-PDA: Not  engaged today. Known patent SVG-diag: Patent. No significant disease   LVEDP 17 mmHg   Successful percutaneous coronary intervention mid LCx        PTCA and stent placement with overlapping stents        2.0 X 18 mm and 2.25 X 15 Onyx drug-eluting stent        Post dilatation with 2.25 mm stent balloon and 2.5X15 mm [place] balloon up to 16 atm    Dec 24, 2022:   The left ventricular ejection fraction is 55-65% by visual estimate.   LM: Normal LAD: Prox-mid 30% disease      Severe moderate disease in small caliber mid to distal LAD LCx: Prox 30% disease      Patent mid LCx stents RCA: Prox 80% disease, mid occlusion SVG-PDA: Patent. Slow retrograde flow into PLA through lesion in ostial PDA. FLow in PLA is somewhat better than 12/10/2022 SVG-Diag: PAtent with diffuse moderate disease, uncahnged from 12/03/2022   LVEDP and LVEF normal   Multiple areas of small caliber vessel moderate to severe disease, none worse with 12/03/2022 and 12/10/2022 caths. No specific culprit vessel identified that is  amenable to any percutaneous or surgical intervention. Recommend continued medical management.    Recommend plavix and pletal 50 mg bid for antiplatelet regimen. Recommend adding Ranexa 500 mg bid for small vessel disease.   Labs:   Lab Results  Component Value Date   WBC 10.4 12/25/2022   HGB 12.2 12/25/2022   HCT 38.5 12/25/2022   MCV 90.2 12/25/2022   PLT 356 12/25/2022    Recent Labs  Lab 12/25/22 1106  NA 132*  K 4.9  CL 103  CO2 19*  BUN 18  CREATININE 0.86  CALCIUM 8.7*  GLUCOSE 161*    Lipid Panel     Component Value Date/Time   CHOL 114 12/05/2022 1057   TRIG 149 12/05/2022 1057   HDL 39 (L) 12/05/2022 1057   CHOLHDL 2.9 12/05/2022 1057   VLDL 30 12/05/2022 1057   LDLCALC 45 12/05/2022 1057    BNP (last 3 results) Recent Labs    12/03/22 0012 12/10/22 1605 12/23/22 0518  BNP 73.3 736.8* 129.6*    HEMOGLOBIN A1C Lab Results  Component Value Date   HGBA1C 8.7 (H) 12/03/2022   MPG 202.99 12/03/2022    Cardiac Panel (last 3 results) No results for input(s): "CKTOTAL", "CKMB", "TROPONINI", "RELINDX" in the last 8760 hours.  Lab Results  Component Value Date   CKTOTAL 150 08/12/2007   CKMB 4.8 (H) 08/12/2007   TROPONINI (H) 08/12/2007    0.07        PERSISTENTLY INCREASED TROPONIN VALUES IN THE RANGE OF 0.06-0.49 ng/mL CAN BE SEEN IN:       -UNSTABLE ANGINA     TSH Recent Labs    12/03/22 0540  TSH 0.381    Radiology: CARDIAC CATHETERIZATION  Addendum Date: 12/24/2022     The left ventricular ejection fraction is 55-65% by visual estimate. LM: Normal LAD: Prox-mid 30% disease      Severe moderate disease in small caliber mid to distal LAD LCx: Prox 30% disease      Patent mid LCx stents RCA: Prox 80% disease, mid occlusion SVG-PDA: Patent. Slow retrograde flow into PLA through lesion in ostial PDA. FLow in PLA is somewhat better than 12/10/2022 SVG-Diag: PAtent with diffuse moderate disease, uncahnged from 12/03/2022 LVEDP and LVEF  normal Multiple areas of small caliber vessel moderate to severe disease, none worse with 12/03/2022  and 12/10/2022 caths. No specific culprit vessel identified that is amenable to any percutaneous or surgical intervention. Recommend continued medical management. Recommend plavix and pletal 50 mg bid for antiplatelet regimen. Recommend adding Ranexa 500 mg bid for small vessel disease. Elder Negus, MD Pager: [phone] Office: [phone]   Result Date: 12/24/2022 Images from the original result were not included. LM: Normal LAD: Prox-mid 30% disease      Severe moderate disease in small caliber mid to distal LAD LCx: Prox 30% disease      Patent mid LCx stents RCA: Prox 80% disease, mid occlusion SVG-PDA: Patent. Slow retrograde flow into PLA through lesion in ostial PDA. FLow in PLA is somewhat better than 12/10/2022 SVG-Diag: PAtent with diffuse moderate disease, uncahnged from 12/03/2022 Multiple areas of small caliber vessel moderate to severe disease, none worse with 12/03/2022 and 12/10/2022 caths. No specific culprit vessel identified that is amenable to any percutaneous or surgical intervention. Recommend continued medical management. Recommend plavix and pletal 50 mg bid for antiplatelet regimen. Recommend adding Ranexa 500 mg bid for small vessel disease. Elder Negus, MD Pager: [phone] Office: [phone]  ECHOCARDIOGRAM LIMITED  Result Date: 12/24/2022    ECHOCARDIOGRAM LIMITED REPORT   Patient Name:   AARIEL FREDERKING Date of Exam: 12/24/2022 Medical Rec #:  [id number]       Height:       61.0 in Accession #:    [id number]      Weight:       144.0 lb Date of Birth:  [birth year]       BSA:          1.643 m Patient Age:    76 years        BP:           140/69 mmHg Patient Gender: F               HR:           71 bpm. Exam Location:  Inpatient Procedure: Limited Echo, Cardiac Doppler, Color Doppler and Intracardiac            Opacification Agent Indications:     122-I22.9 Subsequent ST  elevation (STEM) and non-ST elevation                  (NSTEMI) myocardial infarction  History:         Patient has prior history of Echocardiogram examinations, most                  recent 12/03/2022. CHF, CAD and Previous Myocardial Infarction,                  Prior CABG and Abnormal ECG, Arrythmias:Heart block,                  Signs/Symptoms:Chest Pain; Risk Factors:Diabetes and                  Hypertension.  Sonographer:     Sheralyn Boatman RDCS Referring Phys:  [id number] Tessa Lerner Diagnosing Phys: Rozell Searing Custovic  Sonographer Comments: Technically difficult study due to poor echo windows and patient is obese. Image acquisition challenging due to patient body habitus. IMPRESSIONS  1. Left ventricular ejection fraction, by estimation, is 55 to 60%. Left ventricular ejection fraction by PLAX is 58 %. The left ventricle has normal function. Left ventricular diastolic function could not be evaluated.  2. Right ventricular systolic function is normal.  3. The mitral valve is grossly  normal. Mild mitral valve regurgitation. FINDINGS  Left Ventricle: Left ventricular ejection fraction, by estimation, is 55 to 60%. Left ventricular ejection fraction by PLAX is 58 %. The left ventricle has normal function. Definity contrast agent was given IV to delineate the left ventricular endocardial borders. Left ventricular diastolic function could not be evaluated.  LV Wall Scoring: The inferior wall, mid inferoseptal segment, and basal inferoseptal segment are hypokinetic. Right Ventricle: Right ventricular systolic function is normal. Pericardium: There is no evidence of pericardial effusion. Mitral Valve: The mitral valve is grossly normal. Mild mitral valve regurgitation. Tricuspid Valve: The tricuspid valve is grossly normal. Additional Comments: Spectral Doppler performed. Color Doppler performed.  LEFT VENTRICLE PLAX 2D LV EF:         Left ventricular ejection fraction by PLAX is 58 %. LVIDd:         3.70 cm LVIDs:          2.60 cm LV PW:         1.00 cm LV IVS:        1.20 cm   AORTA Ao Asc diam: 3.30 cm Designer, multimedia signed by Clotilde Dieter Signature Date/Time: 12/24/2022/12:10:40 PM    Final    CARDIAC CATHETERIZATION  Addendum Date: 12/23/2022   LM: Mild disease LAD: Prox-mid 30% diffuse disease, distal 75% diffuse disease Lcx: Prox 30%, mid 75% diffuse disease, followed by focal 90% stenosis        OM1: Small caliber, bifurcating. Severe diffuse disease, not amenable to intervention RCA: Not engaged today. Known Prox 80% diffuse disease, followed 100 mid ISR SVG-PDA: Not engaged today. Known patent SVG-diag: Patent. Moderate diffuse disease LVEDP 17 mmHg Successful percutaneous coronary intervention mid LCx        PTCA and stent placement with overlapping stents        2.0 X 18 mm and 2.25 X 15 Onyx drug-eluting stent        Post dilatation with 2.25 mm stent balloon and 2.5X15 mm [place] balloon up to 16 atm Elder Negus, MD Pager: [phone] Office: [phone]    Addendum Date: 12/10/2022   LM: Mild disease LAD: Prox-mid 30% diffuse disease, distal 75% diffuse disease Lcx: Prox 30%, mid 75% diffuse disease, followed by focal 90% stenosis RCA: Not engaged today. Known Prox 80% diffuse disease, followed 100 mid ISR SVG-PDA: Not engaged today. Known patent SVG-diag: Patent. No significant disease LVEDP 17 mmHg Successful percutaneous coronary intervention mid LCx        PTCA and stent placement with overlapping stents        2.0 X 18 mm and 2.25 X 15 Onyx drug-eluting stent        Post dilatation with 2.25 mm stent balloon and 2.5X15 mm [place] balloon up to 16 atm Elder Negus, MD Pager: [phone] Office: [phone]    Result Date: 12/23/2022 Images from the original result were not included. LM: Mild disease LAD: Prox-mid 30% diffuse disease, distal 75% diffuse disease Lcx: Mid 75% diffuse disease, followed by focal 90% stenosis RCA: Not engaged today. Known Prox 80% diffuse disease,  followed 100 mid ISR SVG-PDA: Not engaged today. Known patent SVG-diag: Patent. No significant disease LVEDP 17 mmHg Successful percutaneous coronary intervention mid LCx        PTCA and stent placement with overlapping stents        2.0 X 18 mm and 2.25 X 15 Onyx drug-eluting stent        Post dilatation with 2.25 mm stent  balloon and 2.5X15 mm [place] balloon up to 16 atm Elder Negus, MD Pager: [phone] Office: [phone]    DG Chest Port 1 View  Result Date: 12/23/2022 CLINICAL DATA:  77 year old female with history of shortness of breath. EXAM: PORTABLE CHEST 1 VIEW COMPARISON:  Chest x-ray 12/10/2022. FINDINGS: Lung volumes are normal. No consolidative airspace disease. No pleural effusions. No pneumothorax. No pulmonary nodule or mass noted. Pulmonary vasculature and the cardiomediastinal silhouette are within normal limits. Atherosclerosis in the thoracic aorta. Status post median sternotomy for CABG. IMPRESSION: 1.  No radiographic evidence of acute cardiopulmonary disease. 2. Aortic atherosclerosis. Electronically Signed   By: Trudie Reed M.D.   On: 12/23/2022 05:45   DG CHEST PORT 1 VIEW  Result Date: 12/10/2022 CLINICAL DATA:  Chest pain. EXAM: PORTABLE CHEST 1 VIEW COMPARISON:  December 04, 2022. FINDINGS: The heart size and mediastinal contours are within normal limits. Status post coronary artery bypass graft. Possible [place] B lines are noted bilaterally suggesting minimal pulmonary edema or bibasilar subsegmental atelectasis. The visualized skeletal structures are unremarkable. IMPRESSION: Possible [place] B lines are noted bilaterally suggesting minimal pulmonary edema or possibly bibasilar subsegmental atelectasis. Electronically Signed   By: Lupita Raider M.D.   On: 12/10/2022 15:16   CARDIAC CATHETERIZATION  Addendum Date: 12/10/2022   LM: Normal LAD: Prox-mid 30% diffuse disease, distal 75% diffuse disease Lcx: Mid 75% diffuse disease RCA: Prox 80% diffuse disease,  followed 100 mid ISR SVG-PDA: Patent SVG-diag: Could not be visualized Temporary transvenous pacemaker placement. Patient was very confused and kept attempting to sit up.  I did not feel leaving femoral sheath in place was safe.  Therefore, I decided to go ahead and place 5 [place] Mynx closure. With transient hypotension, patient was very confused and disoriented.  Code stroke was called.  Patient was evaluated by neurology and thought to be less likely to have had stroke.  CT head was ordered out of abundance caution.  Patient is mental alertness improved with time.  She will be kept into heart ICU overnight for monitoring.  Temporary pacemaker will be removed tomorrow morning if no further use is needed. Unusually prolonged procedure time due to intraprocedural hypotension, complete AV block requiring placement of temporary transvenous placement, disorientation requiring further neurological evaluation. Elder Negus, MD Pager: [phone] Office: [phone]  Addendum Date: 12/04/2022   LM: Normal LAD: Prox-mid 30% diffuse disease, distal 75% diffuse disease Lcx: Mid 75% diffuse disease RCA: Prox 80% diffuse disease, followed 100 mid ISR SVG-PDA: Patent SVG-diag: Could not be visualized Temporary transvenous pacemaker placement. Patient was very confused and kept attempting to sit up.  I did not feel leaving femoral sheath in place was safe.  Therefore, I decided to go ahead and place 5 [place] Mynx closure. With transient hypotension, patient was very confused and disoriented.  Code stroke was called.  Patient was evaluated by neurology and thought to be less likely to have had stroke.  CT head was ordered out of abundance caution.  Patient is mental alertness improved with time.  She will be kept into heart ICU overnight for monitoring.  Temporary pacemaker will be removed tomorrow morning if no further use is needed. Unusually prolonged procedure time due to intraprocedural hypotension, complete AV  block requiring placement of temporary transvenous placement, disorientation requiring further neurological evaluation. Elder Negus, MD Pager: [phone] Office: [phone]   Result Date: 12/10/2022 Images from the original result were not included. LM: Normal LAD: Prox-mid 30% diffuse disease, distal  75% diffuse disease Lcx: Mid 75% diffuse disease RCA: Prox 80% diffuse disease, followed 100 mid ISR SVG-PDA: Patent SVG-diag: Could not be visualized Temporary transvenous pacemaker placement. Patient was very confused and kept attempting to sit up.  I did not feel leaving femoral sheath in place was safe.  Therefore, I decided to go ahead and place 5 [place] Mynx closure. With transient hypotension, patient was very confused and disoriented.  Code stroke was called.  Patient was evaluated by neurology and thought to be less likely to have had stroke.  CT head was ordered out of abundance caution.  Patient is mental alertness improved with time.  She will be kept into heart ICU overnight for monitoring.  Temporary pacemaker will be removed tomorrow morning if no further use is needed. Unusually prolonged procedure time due to intraprocedural hypotension, complete AV block requiring placement of temporary transvenous placement, disorientation requiring further neurological evaluation. Elder Negus, MD Pager: [phone] Office: [phone]  DG CHEST PORT 1 VIEW  Result Date: 12/04/2022 CLINICAL DATA:  Shortness of breath EXAM: PORTABLE CHEST 1 VIEW COMPARISON:  12/02/2022 FINDINGS: Cardiac shadow is stable. Postsurgical changes are again seen. Right jugular sheath is now noted in satisfactory position. No pneumothorax is seen. The lungs are clear. IMPRESSION: No acute abnormality noted. Electronically Signed   By: Alcide Clever M.D.   On: 12/04/2022 10:28   CT HEAD CODE STROKE WO CONTRAST`  Result Date: 12/03/2022 CLINICAL DATA:  Code stroke.  Unable to speak, post catheterization EXAM: CT  HEAD WITHOUT CONTRAST TECHNIQUE: Contiguous axial images were obtained from the base of the skull through the vertex without intravenous contrast. RADIATION DOSE REDUCTION: This exam was performed according to the departmental dose-optimization program which includes automated exposure control, adjustment of the mA and/or kV according to patient size and/or use of iterative reconstruction technique. COMPARISON:  12/18/2019 CT head FINDINGS: Brain: No evidence of acute infarction, hemorrhage, mass, mass effect, or midline shift. No hydrocephalus or extra-axial collection. Vascular: Suspect contrast in the vasculature from recent procedure. Skull: Negative for fracture or focal lesion. Sinuses/Orbits: No acute finding. Status post bilateral lens replacements. Other: The mastoid air cells are well aerated. ASPECTS [facility] Stroke Program Early CT Score) - Ganglionic level infarction (caudate, lentiform nuclei, internal capsule, insula, M1-M3 cortex): 7 - Supraganglionic infarction (M4-M6 cortex): 3 Total score (0-10 with 10 being normal): 10 IMPRESSION: No acute intracranial process. ASPECTS is 10. Imaging results were communicated on 12/03/2022 at 6:59 pm to provider Dr. Derry Lory via secure text paging. Electronically Signed   By: Wiliam Ke M.D.   On: 12/03/2022 18:59   ECHOCARDIOGRAM COMPLETE  Result Date: 12/03/2022    ECHOCARDIOGRAM REPORT   Patient Name:   CHUA SAVOY Date of Exam: 12/03/2022 Medical Rec #:  [id number]       Height:       61.0 in Accession #:    [id number]      Weight:       150.0 lb Date of Birth:  March 15, 1946       BSA:          1.671 m Patient Age:    76 years        BP:           134/55 mmHg Patient Gender: F               HR:           63 bpm. Exam Location:  Inpatient Procedure: 2D Echo, Cardiac  Doppler, Color Doppler and Intracardiac            Opacification Agent Indications:     elevated troponin  History:         Patient has prior history of Echocardiogram examinations. CAD,                   Prior CABG; Risk Factors:Diabetes and Hypertension.  Sonographer:     Mike Gip Referring Phys:  Angie Fava Diagnosing Phys: Arvilla Meres MD IMPRESSIONS  1. Left ventricular ejection fraction, by estimation, is 60 to 65%. The left ventricle has normal function. There is mild concentric left ventricular hypertrophy. Left ventricular diastolic parameters are consistent with Grade II diastolic dysfunction (pseudonormalization). On the 2-chamber contrast images there appears to be a small segment of akinesis in the basilar to mid inferior wall. This is not seen in any other images.  2. Right ventricular systolic function is normal. The right ventricular size is normal. There is normal pulmonary artery systolic pressure.  3. Left atrial size was mildly dilated.  4. The mitral valve is normal in structure. Mild mitral valve regurgitation. No evidence of mitral stenosis.  5. The aortic valve is tricuspid. There is mild calcification of the aortic valve. Aortic valve regurgitation is trivial. No aortic stenosis is present.  6. The inferior vena cava is normal in size with <50% respiratory variability, suggesting right atrial pressure of 8 mmHg. FINDINGS  Left Ventricle: Left ventricular ejection fraction, by estimation, is 60 to 65%. The left ventricle has normal function. The left ventricle has no regional wall motion abnormalities. Definity contrast agent was given IV to delineate the left ventricular  endocardial borders. The left ventricular internal cavity size was normal in size. There is mild concentric left ventricular hypertrophy. Left ventricular diastolic parameters are consistent with Grade II diastolic dysfunction (pseudonormalization). Right Ventricle: The right ventricular size is normal. No increase in right ventricular wall thickness. Right ventricular systolic function is normal. There is normal pulmonary artery systolic pressure. The tricuspid regurgitant velocity is 1.85 m/s,  and  with an assumed right atrial pressure of 8 mmHg, the estimated right ventricular systolic pressure is 21.7 mmHg. Left Atrium: Left atrial size was mildly dilated. Right Atrium: Right atrial size was normal in size. Pericardium: There is no evidence of pericardial effusion. Mitral Valve: The mitral valve is normal in structure. Mild mitral valve regurgitation. No evidence of mitral valve stenosis. Tricuspid Valve: The tricuspid valve is normal in structure. Tricuspid valve regurgitation is trivial. No evidence of tricuspid stenosis. Aortic Valve: The aortic valve is tricuspid. There is mild calcification of the aortic valve. Aortic valve regurgitation is trivial. No aortic stenosis is present. Pulmonic Valve: The pulmonic valve was normal in structure. Pulmonic valve regurgitation is mild. No evidence of pulmonic stenosis. Aorta: The aortic root is normal in size and structure. Venous: The inferior vena cava is normal in size with less than 50% respiratory variability, suggesting right atrial pressure of 8 mmHg. IAS/Shunts: No atrial level shunt detected by color flow Doppler.  LEFT VENTRICLE PLAX 2D LVIDd:         3.60 cm      Diastology LVIDs:         2.40 cm      LV e' medial:    4.03 cm/s LV PW:         1.10 cm      LV E/e' medial:  29.0 LV IVS:  1.20 cm      LV e' lateral:   7.51 cm/s LVOT diam:     1.90 cm      LV E/e' lateral: 15.6 LV SV:         62 LV SV Index:   37 LVOT Area:     2.84 cm  LV Volumes (MOD) LV vol d, MOD A2C: 89.0 ml LV vol d, MOD A4C: 115.0 ml LV vol s, MOD A2C: 25.3 ml LV vol s, MOD A4C: 28.9 ml LV SV MOD A2C:     63.7 ml LV SV MOD A4C:     115.0 ml LV SV MOD BP:      75.7 ml RIGHT VENTRICLE            IVC RV Basal diam:  3.70 cm    IVC diam: 2.10 cm RV S prime:     9.36 cm/s TAPSE (M-mode): 1.8 cm LEFT ATRIUM             Index        RIGHT ATRIUM           Index LA diam:        3.30 cm 1.97 cm/m   RA Area:     13.30 cm LA Vol (A2C):   40.1 ml 23.99 ml/m  RA Volume:   25.90 ml   15.50 ml/m LA Vol (A4C):   54.6 ml 32.67 ml/m LA Biplane Vol: 50.3 ml 30.09 ml/m  AORTIC VALVE LVOT Vmax:   89.00 cm/s LVOT Vmean:  59.200 cm/s LVOT VTI:    0.220 m  AORTA Ao Root diam: 2.80 cm Ao Asc diam:  3.40 cm MITRAL VALVE                TRICUSPID VALVE MV Area (PHT): 3.08 cm     TR Peak grad:   13.7 mmHg MV Decel Time: 246 msec     TR Vmax:        185.00 cm/s MV E velocity: 117.00 cm/s MV A velocity: 101.00 cm/s  SHUNTS MV E/A ratio:  1.16         Systemic VTI:  0.22 m                             Systemic Diam: 1.90 cm Arvilla Meres MD Electronically signed by Arvilla Meres MD Signature Date/Time: 12/03/2022/3:00:31 PM    Final (Updated)    DG Chest Portable 1 View  Result Date: 12/03/2022 CLINICAL DATA:  Chest pain. EXAM: PORTABLE CHEST 1 VIEW COMPARISON:  Dec 17, 2025 FINDINGS: Multiple sternal wires are noted. The heart size and mediastinal contours are within normal limits. There is moderate severity calcification of the aortic arch and tortuosity of the descending thoracic aorta. Low lung volumes are seen. Both lungs are clear. The visualized skeletal structures are unremarkable. IMPRESSION: 1. Evidence of prior median sternotomy/CABG. 2. No acute cardiopulmonary disease. Electronically Signed   By: Aram Candela M.D.   On: 12/03/2022 00:04      FOLLOW UP PLANS AND APPOINTMENTS Discharge Instructions     AMB referral to Phase II Cardiac Rehabilitation   Complete by: As directed    Diagnosis: NSTEMI   After initial evaluation and assessments completed: Virtual Based Care may be provided alone or in conjunction with Phase 2 Cardiac Rehab based on patient barriers.: Yes   Intensive Cardiac Rehabilitation (ICR) [facility] location only OR Traditional Cardiac Rehabilitation (TCR) *If criteria  for ICR are not met will enroll in [facility] only): Yes      Allergies as of 12/25/2022       Reactions   Asa [aspirin] Anaphylaxis   Compazine [prochlorperazine Edisylate] Swelling, Other (See  Comments)   Tongue swelling    Nsaids Anaphylaxis   Shellfish Allergy Hives, Swelling   Sulfa Antibiotics Hives, Swelling   Zantac [ranitidine Hcl] Hives, Swelling   Lisinopril Cough        Medication List     STOP taking these medications    Brilinta 90 MG Tabs tablet Generic drug: ticagrelor       TAKE these medications    acetaminophen 650 MG CR tablet Commonly known as: TYLENOL Take 1,300 mg by mouth at bedtime.   Align 4 MG Caps Take 4 mg by mouth daily.   amLODipine 5 MG tablet Commonly known as: NORVASC Take 5 mg by mouth daily.   atorvastatin 40 MG tablet Commonly known as: LIPITOR Take 1 tablet (40 mg total) by mouth daily.   CALCIUM PO Take 1 tablet by mouth in the morning and at bedtime.   cilostazol 50 MG tablet Commonly known as: PLETAL Take 1 tablet (50 mg total) by mouth 2 (two) times daily.   cloNIDine 0.1 MG tablet Commonly known as: CATAPRES Take 1 tablet (0.1 mg total) by mouth every other day for 14 days.   clopidogrel 75 MG tablet Commonly known as: PLAVIX Take 1 tablet (75 mg total) by mouth daily. Start taking on: Dec 26, 2022   Coenzyme Q10 300 MG Caps Take 300 mg by mouth 2 (two) times daily.   D-Mannose 500 MG Caps Take 500 mg by mouth in the morning and at bedtime.   empagliflozin 10 MG Tabs tablet Commonly known as: JARDIANCE Take 1 tablet (10 mg total) by mouth daily.   hydrochlorothiazide 12.5 MG capsule Commonly known as: MICROZIDE Take 12.5 mg by mouth daily.   Lantus SoloStar 100 UNIT/ML Solostar Pen Generic drug: insulin glargine Inject 10 Units into the skin at bedtime. What changed: how much to take   levothyroxine 75 MCG tablet Commonly known as: SYNTHROID Take 75 mcg by mouth daily before breakfast.   losartan 100 MG tablet Commonly known as: COZAAR Take 1 tablet (100 mg total) by mouth daily.   metoprolol tartrate 50 MG tablet Commonly known as: LOPRESSOR Take 1 tablet (50 mg total) by mouth 2  (two) times daily.   MULTIVITAMIN ADULT PO Take 1 tablet by mouth daily.   nitroGLYCERIN 0.4 MG SL tablet Commonly known as: NITROSTAT Place 1 tablet (0.4 mg total) under the tongue every 5 (five) minutes as needed for chest pain.   OneTouch Verio test strip Generic drug: glucose blood 1 each 2 (two) times daily.   pantoprazole 40 MG tablet Commonly known as: PROTONIX Take 1 tablet (40 mg total) by mouth daily before breakfast.   ranolazine 500 MG 12 hr tablet Commonly known as: RANEXA Take 1 tablet (500 mg total) by mouth 2 (two) times daily.   ZyrTEC Allergy 10 MG Caps Generic drug: Cetirizine HCl Take 1 tablet by mouth daily.        Follow-up Information     Tessa Lerner, DO Follow up on 01/01/2023.   Specialties: Cardiology, Vascular Surgery Why: 1030am  Labs to be done 24 to 48 hours prior to office visit Contact information: [address] [place] [address] [phone]  Total time spent on patient's discharge was 39 minutes.  Tessa Lerner, [place], [facility]  Pager:  [phone] Office: [phone]

## 2022-12-26 ENCOUNTER — Telehealth: Payer: Self-pay

## 2022-12-26 NOTE — Telephone Encounter (Signed)
Location of hospitalization: [place] Reason for hospitalization: chest pain and SOB Date of discharge: 12/25/2022 Date of first communication with patient: today Person contacting patient: Me Current symptoms: None Do you understand why you were in the Hospital: Yes Questions regarding discharge instructions: None Where were you discharged to: Home Medications reviewed: Yes Allergies reviewed: Yes Dietary changes reviewed: Yes. Discussed low fat and low salt diet.  Referals reviewed: NA Activities of Daily Living: Able to with mild limitations Any transportation issues/concerns: None Any patient concerns: None Confirmed importance & date/time of Follow up appt: Yes Confirmed with patient if condition begins to worsen call. Pt was given the office number and encouraged to call back with questions or concerns: Yes

## 2022-12-27 DIAGNOSIS — I442 Atrioventricular block, complete: Secondary | ICD-10-CM | POA: Diagnosis not present

## 2022-12-27 DIAGNOSIS — K219 Gastro-esophageal reflux disease without esophagitis: Secondary | ICD-10-CM | POA: Diagnosis not present

## 2022-12-27 DIAGNOSIS — M8580 Other specified disorders of bone density and structure, unspecified site: Secondary | ICD-10-CM | POA: Diagnosis not present

## 2022-12-27 DIAGNOSIS — F419 Anxiety disorder, unspecified: Secondary | ICD-10-CM | POA: Diagnosis not present

## 2022-12-27 DIAGNOSIS — Z7902 Long term (current) use of antithrombotics/antiplatelets: Secondary | ICD-10-CM | POA: Diagnosis not present

## 2022-12-27 DIAGNOSIS — I214 Non-ST elevation (NSTEMI) myocardial infarction: Secondary | ICD-10-CM | POA: Diagnosis not present

## 2022-12-27 DIAGNOSIS — E1169 Type 2 diabetes mellitus with other specified complication: Secondary | ICD-10-CM | POA: Diagnosis not present

## 2022-12-27 DIAGNOSIS — E039 Hypothyroidism, unspecified: Secondary | ICD-10-CM | POA: Diagnosis not present

## 2022-12-27 DIAGNOSIS — G4733 Obstructive sleep apnea (adult) (pediatric): Secondary | ICD-10-CM | POA: Diagnosis not present

## 2022-12-27 DIAGNOSIS — E876 Hypokalemia: Secondary | ICD-10-CM | POA: Diagnosis not present

## 2022-12-27 DIAGNOSIS — I5032 Chronic diastolic (congestive) heart failure: Secondary | ICD-10-CM | POA: Diagnosis not present

## 2022-12-27 DIAGNOSIS — I110 Hypertensive heart disease with heart failure: Secondary | ICD-10-CM | POA: Diagnosis not present

## 2022-12-27 DIAGNOSIS — I2510 Atherosclerotic heart disease of native coronary artery without angina pectoris: Secondary | ICD-10-CM | POA: Diagnosis not present

## 2022-12-27 DIAGNOSIS — E782 Mixed hyperlipidemia: Secondary | ICD-10-CM | POA: Diagnosis not present

## 2022-12-27 DIAGNOSIS — D649 Anemia, unspecified: Secondary | ICD-10-CM | POA: Diagnosis not present

## 2022-12-27 DIAGNOSIS — M1990 Unspecified osteoarthritis, unspecified site: Secondary | ICD-10-CM | POA: Diagnosis not present

## 2022-12-28 ENCOUNTER — Other Ambulatory Visit: Payer: Self-pay

## 2022-12-28 DIAGNOSIS — I2510 Atherosclerotic heart disease of native coronary artery without angina pectoris: Secondary | ICD-10-CM

## 2022-12-28 DIAGNOSIS — I739 Peripheral vascular disease, unspecified: Secondary | ICD-10-CM | POA: Diagnosis not present

## 2022-12-28 DIAGNOSIS — I10 Essential (primary) hypertension: Secondary | ICD-10-CM

## 2022-12-28 DIAGNOSIS — I214 Non-ST elevation (NSTEMI) myocardial infarction: Secondary | ICD-10-CM

## 2022-12-28 DIAGNOSIS — E1142 Type 2 diabetes mellitus with diabetic polyneuropathy: Secondary | ICD-10-CM | POA: Diagnosis not present

## 2022-12-28 NOTE — Telephone Encounter (Signed)
Called patient to inform her about the labs no answer left a vm

## 2023-01-01 ENCOUNTER — Ambulatory Visit: Payer: Medicare Other | Admitting: Cardiology

## 2023-01-01 ENCOUNTER — Encounter: Payer: Self-pay | Admitting: Cardiology

## 2023-01-01 VITALS — BP 92/56 | HR 78 | Resp 16 | Ht 61.0 in | Wt 147.2 lb

## 2023-01-01 DIAGNOSIS — I10 Essential (primary) hypertension: Secondary | ICD-10-CM

## 2023-01-01 DIAGNOSIS — I214 Non-ST elevation (NSTEMI) myocardial infarction: Secondary | ICD-10-CM | POA: Diagnosis not present

## 2023-01-01 DIAGNOSIS — I25118 Atherosclerotic heart disease of native coronary artery with other forms of angina pectoris: Secondary | ICD-10-CM

## 2023-01-01 DIAGNOSIS — Z951 Presence of aortocoronary bypass graft: Secondary | ICD-10-CM | POA: Diagnosis not present

## 2023-01-01 DIAGNOSIS — G4733 Obstructive sleep apnea (adult) (pediatric): Secondary | ICD-10-CM

## 2023-01-01 DIAGNOSIS — E118 Type 2 diabetes mellitus with unspecified complications: Secondary | ICD-10-CM

## 2023-01-01 DIAGNOSIS — E785 Hyperlipidemia, unspecified: Secondary | ICD-10-CM

## 2023-01-01 DIAGNOSIS — Z955 Presence of coronary angioplasty implant and graft: Secondary | ICD-10-CM

## 2023-01-01 NOTE — Progress Notes (Signed)
ID:  Susan Barnett, DOB Mar 18, 1946, MRN [id number]  PCP:  Johny Blamer, MD  Cardiologist:  Tessa Lerner, DO, [facility] (established care 11/30/2022) Former Cardiology Providers: Dr. Verdis Prime Sleep medicine provider: Dr. Carolanne Grumbling  Date: 01/01/23 Last Office Visit: 12/20/2022  Chief Complaint  Patient presents with   Transitions Of Care    1 week NSTEMI    HPI  Susan Barnett is a 77 y.o. Caucasian female whose past medical history and cardiovascular risk factors include: CAD status post CABG 2015 (SVG to diagonal and SVG to PDA) and s/p PCI to LCX (11/2022), hypertension, diabetes mellitus type 2, hyperlipidemia, OSA  Patient was initially referred to the office for an expedited evaluation for chest pain and given her history of CAD/CABG.  Her symptoms were concerning for angina pectoris underwent heart catheterization on 12/04/2022.  During initial catheterization SVG to the diagonal branch was not evaluated due to conduction disease &  patient needed a TVP placement and was being monitored in ICU.  She did not need permanent pacemaker.  And prior to discharge a repeat heart catheterization to reevaluate SVG to diagonal branch was recommended but patient refused.  Postdischarge she comes to the office for worsening chest pain.  There was concerns for unstable angina and patient underwent urgent catheterization on 12/10/2022 and she underwent PCI to the LCx.  She was later discharged home.  She later presents to the hospital with similar anginal discomfort but less severe.  Her high sensitive troponins were elevated and she ruled in for NSTEMI.  She underwent third heart catheterization on 12/24/2022 which noted patent stents and moderate/severe disease and small caliber vessels no significant change compared to prior studies and no specific culprit vessel was identified per report.  She now presents for follow-up.  Patient denies chest pain but does have symptoms concerning for  heartburn as well as labile blood sugars.  She is followed up with PCP since discharge and her GERD medication was changed to Voquezna.  Her blood pressures are soft at today's visit and clinically has symptoms of orthostasis.  It appears that she takes all of her medications as in the morning which may be contributory to clinical findings.  She also had shaky and jittery concerning for hypoglycemia.  Symptoms improved with hydration and a small snack.   ALLERGIES: Allergies  Allergen Reactions   Asa [Aspirin] Anaphylaxis   Compazine [Prochlorperazine Edisylate] Swelling and Other (See Comments)    Tongue swelling    Nsaids Anaphylaxis   Shellfish Allergy Hives and Swelling   Sulfa Antibiotics Hives and Swelling   Zantac [Ranitidine Hcl] Hives and Swelling   Lisinopril Cough    MEDICATION LIST PRIOR TO VISIT: Current Meds  Medication Sig   acetaminophen (TYLENOL) 650 MG CR tablet Take 1,300 mg by mouth at bedtime.   amLODipine (NORVASC) 5 MG tablet Take 5 mg by mouth daily at 10 pm. Take if SBP >190mmHG   atorvastatin (LIPITOR) 40 MG tablet Take 1 tablet (40 mg total) by mouth daily.   CALCIUM PO Take 1 tablet by mouth in the morning and at bedtime.   Cetirizine HCl (ZYRTEC ALLERGY) 10 MG CAPS Take 1 tablet by mouth daily.   cilostazol (PLETAL) 50 MG tablet Take 1 tablet (50 mg total) by mouth 2 (two) times daily.   cloNIDine (CATAPRES) 0.1 MG tablet Take 1 tablet (0.1 mg total) by mouth every other day for 14 days.   clopidogrel (PLAVIX) 75 MG tablet Take 1  tablet (75 mg total) by mouth daily.   Coenzyme Q10 300 MG CAPS Take 300 mg by mouth 2 (two) times daily.   D-Mannose 500 MG CAPS Take 500 mg by mouth in the morning and at bedtime.   empagliflozin (JARDIANCE) 10 MG TABS tablet Take 1 tablet (10 mg total) by mouth daily.   hydrochlorothiazide (HYDRODIURIL) 12.5 MG tablet Take 12.5 mg by mouth every evening. Take if SBP>121mmHG.   LANTUS SOLOSTAR 100 UNIT/ML Solostar Pen Inject 10  Units into the skin at bedtime. (Patient taking differently: Inject 40 Units into the skin at bedtime.)   levothyroxine (SYNTHROID, LEVOTHROID) 75 MCG tablet Take 75 mcg by mouth daily before breakfast.    losartan (COZAAR) 100 MG tablet Take 1 tablet (100 mg total) by mouth daily.   metoprolol tartrate (LOPRESSOR) 50 MG tablet Take 1 tablet (50 mg total) by mouth 2 (two) times daily.   Multiple Vitamin (MULTIVITAMIN ADULT PO) Take 1 tablet by mouth daily.   nitroGLYCERIN (NITROSTAT) 0.4 MG SL tablet Place 1 tablet (0.4 mg total) under the tongue every 5 (five) minutes as needed for chest pain.   ONETOUCH VERIO test strip 1 each 2 (two) times daily.   Probiotic Product (ALIGN) 4 MG CAPS Take 4 mg by mouth daily.   ranolazine (RANEXA) 500 MG 12 hr tablet Take 1 tablet (500 mg total) by mouth 2 (two) times daily.   Vonoprazan Fumarate (VOQUEZNA) 20 MG TABS Take by mouth.   [DISCONTINUED] hydrochlorothiazide (MICROZIDE) 12.5 MG capsule Take 12.5 mg by mouth daily.     PAST MEDICAL HISTORY: Past Medical History:  Diagnosis Date   Anemia    mild   Anginal pain (HCC)    with exertion, last time prior to cardiac caht 06/02/14   Anxiety    situational   Arthritis    Breast cancer (HCC) 2018   Left Breast Cancer   CAD (coronary artery disease)    Dr. Mendel Ryder follows, no problems now   Cancer [facility])    Breast cancer   Complication of anesthesia    Diabetes mellitus, type 2 (HCC)    TYPE 2   Dysrhythmia    occ skips a beat, rate was fast before beta blocker.   Gastric polyp    Genetic testing 04/12/2017   Ms. Janes underwent genetic counseling and testing for hereditary cancer syndromes on 02/19/2017. Her results were negative for mutations in all 46 genes analyzed by [employer]'s 46-gene Common Hereditary Cancers Panel. Genes analyzed include: APC, ATM, AXIN2, BARD1, BMPR1A, BRCA1, BRCA2, BRIP1, CDH1, CDKN2A, CHEK2, CTNNA1, DICER1, EPCAM, GREM1, HOXB13, KIT, MEN1, MLH1, MSH2, MSH3, MSH6,  MUTYH, NBN   GERD (gastroesophageal reflux disease)    Headache    hx of   Heartburn    Hiatal hernia    mild head tremors   History of radiation therapy 01/30/17- 02/21/17   Left Breast 42.56 Gy in 16 fractions   Hypertension    Hypothyroidism    Occasional tremors    "of head", slight hands   Osteopenia    Personal history of radiation therapy 2018   Left Breast Cancer   Pneumonia    PONV (postoperative nausea and vomiting) 02/24/14   Shortness of breath    with exertion   Sleep apnea    cpap used nightly x 2 yrs now.    PAST SURGICAL HISTORY: Past Surgical History:  Procedure Laterality Date   BREAST BIOPSY Left 1987   BREAST BIOPSY Left 2019  BREAST LUMPECTOMY Left 01/25/2017   BREAST LUMPECTOMY WITH RADIOACTIVE SEED AND SENTINEL LYMPH NODE BIOPSY Left 12/25/2016   Procedure: BREAST LUMPECTOMY WITH RADIOACTIVE SEED AND SENTINEL LYMPH NODE BIOPSY;  Surgeon: Glenna Fellows, MD;  Location: [facility] OR;  Service: General;  Laterality: Left;   CARDIAC CATHETERIZATION  06/02/2014   LAD 30%, D1 80%, CFX 755, RCA > 95% ISR, rx w/ PTCA, heavy calcification, EF 65%   CORONARY ARTERY BYPASS GRAFT N/A 06/14/2014   Procedure: CORONARY ARTERY BYPASS GRAFTING (CABG);  Surgeon: Alleen Borne, MD;  Location: [facility] OR;  Service: Open Heart Surgery;  Laterality: N/A;  Times 2 using endoscopically harvested right saphenous vein.   CORONARY STENT INTERVENTION N/A 12/10/2022   Procedure: CORONARY STENT INTERVENTION;  Surgeon: Elder Negus, MD;  Location: [facility] INVASIVE CV LAB;  Service: Cardiovascular;  Laterality: N/A;   coronary stents     x3 stents placed-prior ablation   CORONARY ULTRASOUND/IVUS N/A 12/10/2022   Procedure: Coronary Ultrasound/IVUS;  Surgeon: Elder Negus, MD;  Location: [facility] INVASIVE CV LAB;  Service: Cardiovascular;  Laterality: N/A;   DILATION AND CURETTAGE OF UTERUS     ELBOW FRACTURE SURGERY Left    INCONTINENCE SURGERY     sling   KNEE ARTHROSCOPY Right  02/24/2014   Procedure: RIGHT KNEE ARTHROSCOPY WITH DEBRIDEMENT ;  Surgeon: Loanne Drilling, MD;  Location: [facility] ORS;  Service: Orthopedics;  Laterality: Right;   LEFT HEART CATH AND CORS/GRAFTS ANGIOGRAPHY N/A 12/03/2022   Procedure: LEFT HEART CATH AND CORS/GRAFTS ANGIOGRAPHY;  Surgeon: Elder Negus, MD;  Location: [facility] INVASIVE CV LAB;  Service: Cardiovascular;  Laterality: N/A;   LEFT HEART CATH AND CORS/GRAFTS ANGIOGRAPHY N/A 12/10/2022   Procedure: LEFT HEART CATH AND CORS/GRAFTS ANGIOGRAPHY;  Surgeon: Elder Negus, MD;  Location: [facility] INVASIVE CV LAB;  Service: Cardiovascular;  Laterality: N/A;   LEFT HEART CATH AND CORS/GRAFTS ANGIOGRAPHY N/A 12/24/2022   Procedure: LEFT HEART CATH AND CORS/GRAFTS ANGIOGRAPHY;  Surgeon: Elder Negus, MD;  Location: [facility] INVASIVE CV LAB;  Service: Cardiovascular;  Laterality: N/A;   LEFT HEART CATHETERIZATION WITH CORONARY ANGIOGRAM N/A 06/02/2014   Procedure: LEFT HEART CATHETERIZATION WITH CORONARY ANGIOGRAM;  Surgeon: Lesleigh Noe, MD;  Location: [facility] C[facility] LAB;  Service: Cardiovascular;  Laterality: N/A;   TEE WITHOUT CARDIOVERSION N/A 06/14/2014   Procedure: TRANSESOPHAGEAL ECHOCARDIOGRAM (TEE);  Surgeon: Alleen Borne, MD;  Location: [facility] OR;  Service: Open Heart Surgery;  Laterality: N/A;   TEMPORARY PACEMAKER N/A 12/03/2022   Procedure: TEMPORARY PACEMAKER;  Surgeon: Elder Negus, MD;  Location: [facility] INVASIVE CV LAB;  Service: Cardiovascular;  Laterality: N/A;    FAMILY HISTORY: The patient family history includes Breast cancer (age of onset: 109) in her cousin; Diabetes in her mother; Healthy in her brother; Heart attack in her mother; Hodgkin's lymphoma in her maternal uncle; Hypertension in her mother; Lung cancer in her brother; Ulcerative colitis in her daughter.  SOCIAL HISTORY:  The patient  reports that she has never smoked. She has never used smokeless tobacco. She reports that she does not drink alcohol and does not use  drugs.  REVIEW OF SYSTEMS: Review of Systems  Cardiovascular:  Positive for near-syncope. Negative for chest pain, claudication, dyspnea on exertion, irregular heartbeat, leg swelling, orthopnea, palpitations, paroxysmal nocturnal dyspnea and syncope.  Respiratory:  Negative for shortness of breath.   Hematologic/Lymphatic: Negative for bleeding problem.  Musculoskeletal:  Negative for muscle cramps and myalgias.  Gastrointestinal:  Positive for heartburn.  Neurological:  Positive for dizziness and light-headedness.    PHYSICAL EXAM:    01/01/2023   11:02 AM 01/01/2023   10:21 AM 12/25/2022    9:04 AM  Vitals with BMI  Height  5\' 1"    Weight  147 lbs 3 oz   BMI  27.83   Systolic 92 101 123  Diastolic 56 65 55  Pulse 78 99 95    Orthostatic VS for the past 72 hrs (Last 3 readings):  Orthostatic BP Patient Position BP Location Cuff Size Orthostatic Pulse  01/01/23 1127 (!) 68/55 Standing Right Arm Normal 99  01/01/23 1126 (!) 76/50 Sitting Right Arm Normal 85  01/01/23 1125 100/58 Supine Right Arm Normal 82   Physical Exam  Constitutional: No distress.  Age appropriate, hemodynamically stable.   Neck: No JVD present.  Cardiovascular: Normal rate, regular rhythm, S1 normal, S2 normal, intact distal pulses and normal pulses. Exam reveals no gallop, no S3 and no S4.  No murmur heard. Pulmonary/Chest: Effort normal and breath sounds normal. No stridor. She has no wheezes. She has no rales.  Abdominal: Soft. Bowel sounds are normal. She exhibits no distension. There is no abdominal tenderness.  Musculoskeletal:        General: No edema.     Cervical back: Neck supple.  Neurological: She is alert and oriented to person, place, and time. She has intact cranial nerves (2-12).  Skin: Skin is warm and moist.   CARDIAC DATABASE: EKG: Jan 01, 2023: Sinus rhythm, 86 bpm, LAE, old inferior infarct.  Compared to prior tracing 12/20/2022 TWI inferior leads are not present and ventricular  rate is higher.  Echocardiogram: 09/01/2018: LVEF 55 to 60%, grade 2 diastolic dysfunction, trivial AR, mild MR, mild left atrial enlargement.  December 03, 2022:  1. Left ventricular ejection fraction, by estimation, is 60 to 65%. The  left ventricle has normal function. There is mild concentric left  ventricular hypertrophy. Left ventricular diastolic parameters are  consistent with Grade II diastolic dysfunction  (pseudonormalization). On the 2-chamber contrast images there appears to  be a small segment of akinesis in the basilar to mid inferior wall. This  is not seen in any other images.   2. Right ventricular systolic function is normal. The right ventricular  size is normal. There is normal pulmonary artery systolic pressure.   3. Left atrial size was mildly dilated.   4. The mitral valve is normal in structure. Mild mitral valve  regurgitation. No evidence of mitral stenosis.   5. The aortic valve is tricuspid. There is mild calcification of the  aortic valve. Aortic valve regurgitation is trivial. No aortic stenosis is  present.   6. The inferior vena cava is normal in size with <50% respiratory  variability, suggesting right atrial pressure of 8 mmHg.   Stress Testing: MPI 05/18/2020  Nuclear stress EF: 72%. No wall motion abnormalities.  There was no ST segment deviation noted during stress.  The study is normal.  This is a low risk study. No evidence of ischemia or infarction. Prior CABG.  Heart Catheterization: December 03, 2022: LM: Normal LAD: Prox-mid 30% diffuse disease, distal 75% diffuse disease Lcx: Mid 75% diffuse disease RCA: Prox 80% diffuse disease, followed 100 mid ISR SVG-PDA: Patent SVG-diag: Could not be visualized   Temporary transvenous pacemaker placement.   Patient was very confused and kept attempting to sit up.  I did not feel leaving femoral sheath in place was safe.  Therefore, I decided to go ahead and place  5 French Mynx closure.   With  transient hypotension, patient was very confused and disoriented.  Code stroke was called.  Patient was evaluated by neurology and thought to be less likely to have had stroke.  CT head was ordered out of abundance caution.  Patient is mental alertness improved with time.  She will be kept into heart ICU overnight for monitoring.  Temporary pacemaker will be removed tomorrow morning if no further use is needed.   Unusually prolonged procedure time due to intraprocedural hypotension, complete AV block requiring placement of temporary transvenous placement, disorientation requiring further neurological evaluation.  December 10, 2022: LM: Mild disease LAD: Prox-mid 30% diffuse disease, distal 75% diffuse disease Lcx: Prox 30%, mid 75% diffuse disease, followed by focal 90% stenosis RCA: Not engaged today. Known Prox 80% diffuse disease, followed 100 mid ISR SVG-PDA: Not engaged today. Known patent SVG-diag: Patent. No significant disease   LVEDP 17 mmHg   Successful percutaneous coronary intervention mid LCx        PTCA and stent placement with overlapping stents        2.0 X 18 mm and 2.25 X 15 Onyx drug-eluting stent        Post dilatation with 2.25 mm stent balloon and 2.5X15 mm [place] balloon up to 16 atm   12/24/2022:   The left ventricular ejection fraction is 55-65% by visual estimate.   LM: Normal LAD: Prox-mid 30% disease      Severe moderate disease in small caliber mid to distal LAD LCx: Prox 30% disease      Patent mid LCx stents RCA: Prox 80% disease, mid occlusion SVG-PDA: Patent. Slow retrograde flow into PLA through lesion in ostial PDA. FLow in PLA is somewhat better than 12/10/2022 SVG-Diag: PAtent with diffuse moderate disease, uncahnged from 12/03/2022   LVEDP and LVEF normal   Multiple areas of small caliber vessel moderate to severe disease, none worse with 12/03/2022 and 12/10/2022 caths. No specific culprit vessel identified that is amenable to any percutaneous or surgical  intervention. Recommend continued medical management.    Recommend plavix and pletal 50 mg bid for antiplatelet regimen. Recommend adding Ranexa 500 mg bid for small vessel disease.  LABORATORY DATA:    Latest Ref Rng & Units 12/25/2022   11:06 AM 12/23/2022    5:18 AM 12/11/2022    2:25 AM  CBC  WBC 4.0 - 10.5 K/uL 10.4  13.3  14.2   Hemoglobin 12.0 - 15.0 g/dL 16.1  09.6  04.5   Hematocrit 36.0 - 46.0 % 38.5  39.8  36.0   Platelets 150 - 400 K/uL 356  427  391        Latest Ref Rng & Units 12/25/2022   11:06 AM 12/23/2022    5:18 AM 12/11/2022    2:25 AM  CMP  Glucose 70 - 99 mg/dL 409  811  914   BUN 8 - 23 mg/dL 18  16  10    Creatinine 0.44 - 1.00 mg/dL 7.82  9.56  2.13   Sodium 135 - 145 mmol/L 132  134  134   Potassium 3.5 - 5.1 mmol/L 4.9  3.7  2.8   Chloride 98 - 111 mmol/L 103  100  101   CO2 22 - 32 mmol/L 19  22  22    Calcium 8.9 - 10.3 mg/dL 8.7  9.8  8.7     Lipid Panel     Component Value Date/Time   CHOL 114 12/05/2022 1057  TRIG 149 12/05/2022 1057   HDL 39 (L) 12/05/2022 1057   CHOLHDL 2.9 12/05/2022 1057   VLDL 30 12/05/2022 1057   LDLCALC 45 12/05/2022 1057   LDLDIRECT 51 12/05/2022 1057    No components found for: "NTPROBNP" Recent Labs    11/30/22 1154  PROBNP 793*   Recent Labs    12/03/22 0540  TSH 0.381    BMP Recent Labs    12/11/22 0225 12/23/22 0518 12/25/22 1106  NA 134* 134* 132*  K 2.8* 3.7 4.9  CL 101 100 103  CO2 22 22 19*  GLUCOSE 102* 250* 161*  BUN 10 16 18   CREATININE 0.65 1.02* 0.86  CALCIUM 8.7* 9.8 8.7*  GFRNONAA >60 57* >60    HEMOGLOBIN A1C Lab Results  Component Value Date   HGBA1C 8.7 (H) 12/03/2022   MPG 202.99 12/03/2022   External Labs: Collected: 07/26/2022 Hemoglobin A1c 7.6.  Collected: 03/27/2022 Sodium 137, potassium 4.8, chloride 104, bicarb 27. AST 17, ALT 16, alkaline phosphatase 55. BUN 11, creatinine 0.8. Hemoglobin 13.8, hematocrit 39.2%. Total cholesterol 135, triglycerides  160, HDL 38, LDL 65, non-HDL 97. TSH 0.17  IMPRESSION:    ICD-10-CM   1. NSTEMI (non-ST elevated myocardial infarction) (HCC)  I21.4 EKG 12-Lead    2. Coronary artery disease involving native coronary artery of native heart without angina pectoris  I25.10     3. Hx of CABG  Z95.1     4. S/P drug eluting coronary stent placement  Z95.5     5. Essential hypertension  I10     6. Type 2 diabetes mellitus with complications (HCC)  E11.8     7. Hyperlipidemia LDL goal <55  E78.5     8. OSA (obstructive sleep apnea)  G47.33        RECOMMENDATIONS: ARABELA EIRING is a 77 y.o. Caucasian female whose past medical history and cardiac risk factors include: CAD status post CABG 2015 (SVG to diagonal and SVG to PDA) and s/p PCI to LCX (11/2022), hypertension, diabetes mellitus type 2, hyperlipidemia, OSA  NSTEMI (non-ST elevated myocardial infarction) (HCC) Coronary artery disease involving native coronary artery of native heart with stable angina pectoris Hx of CABG S/P drug eluting coronary stent placement Status post PCI to the mid LCx/20 04/2023. Allergic to aspirin. Brilinta was changed to Plavix and cilostazol (tolerating it well) No use off nitroglycerin tablet since hospital discharge. Intermittently still has episodes of precordial pain but overall intensity frequency and duration have improved. EKG shows sinus rhythm with subtle ST elevations in the inferior leads.  Does not meet STEMI criteria.  Reviewed with my colleague Dr. Jacinto Halim.  She has had similar EKGs in the recent past. Recent hospitalization records reviewed including discharge summary. Educated her on the importance of improving her modifiable cardiovascular risk factors. Medication changes as discussed below.  Essential hypertension Hypotensive and orthostatic during today's office visit Improved with hydration and snacks Likely secondary to taking all her antihypertensive medications in the morning. For now have  asked her to hold HCTZ and amlodipine.  She can resume them once her SBP is >130 mmHg She is currently being tapered off of clonidine which should stop later this weekend Check blood pressures at home. Reemphasized importance of low-salt diet.  Type 2 diabetes mellitus with complications (HCC) Follows with Dr. Evlyn Kanner. Recently has had difficulty titrating her blood glucose levels. I suspect a degree of hypoglycemia may also be contributory to her symptoms.   Currently on ARB, statin therapy, Jardianc.  Hyperlipidemia LDL goal <55 Currently on atorvastatin.   She denies myalgia or other side effects. Most recent lipids dated April 2024, independently reviewed as noted above. LDL currently at goal   FINAL MEDICATION LIST END OF ENCOUNTER: No orders of the defined types were placed in this encounter.   Medications Discontinued During This Encounter  Medication Reason   pantoprazole (PROTONIX) 40 MG tablet    hydrochlorothiazide (MICROZIDE) 12.5 MG capsule      Current Outpatient Medications:    acetaminophen (TYLENOL) 650 MG CR tablet, Take 1,300 mg by mouth at bedtime., Disp: , Rfl:    amLODipine (NORVASC) 5 MG tablet, Take 5 mg by mouth daily at 10 pm. Take if SBP >145mmHG, Disp: , Rfl:    atorvastatin (LIPITOR) 40 MG tablet, Take 1 tablet (40 mg total) by mouth daily., Disp: 30 tablet, Rfl: 2   CALCIUM PO, Take 1 tablet by mouth in the morning and at bedtime., Disp: , Rfl:    Cetirizine HCl (ZYRTEC ALLERGY) 10 MG CAPS, Take 1 tablet by mouth daily., Disp: , Rfl:    cilostazol (PLETAL) 50 MG tablet, Take 1 tablet (50 mg total) by mouth 2 (two) times daily., Disp: 180 tablet, Rfl: 3   cloNIDine (CATAPRES) 0.1 MG tablet, Take 1 tablet (0.1 mg total) by mouth every other day for 14 days., Disp: 7 tablet, Rfl: 0   clopidogrel (PLAVIX) 75 MG tablet, Take 1 tablet (75 mg total) by mouth daily., Disp: 90 tablet, Rfl: 3   Coenzyme Q10 300 MG CAPS, Take 300 mg by mouth 2 (two) times daily.,  Disp: , Rfl:    D-Mannose 500 MG CAPS, Take 500 mg by mouth in the morning and at bedtime., Disp: , Rfl:    empagliflozin (JARDIANCE) 10 MG TABS tablet, Take 1 tablet (10 mg total) by mouth daily., Disp: 30 tablet, Rfl: 1   hydrochlorothiazide (HYDRODIURIL) 12.5 MG tablet, Take 12.5 mg by mouth every evening. Take if SBP>146mmHG., Disp: , Rfl:    LANTUS SOLOSTAR 100 UNIT/ML Solostar Pen, Inject 10 Units into the skin at bedtime. (Patient taking differently: Inject 40 Units into the skin at bedtime.), Disp: 15 mL, Rfl: 0   levothyroxine (SYNTHROID, LEVOTHROID) 75 MCG tablet, Take 75 mcg by mouth daily before breakfast. , Disp: , Rfl:    losartan (COZAAR) 100 MG tablet, Take 1 tablet (100 mg total) by mouth daily., Disp: 90 tablet, Rfl: 3   metoprolol tartrate (LOPRESSOR) 50 MG tablet, Take 1 tablet (50 mg total) by mouth 2 (two) times daily., Disp: 60 tablet, Rfl: 1   Multiple Vitamin (MULTIVITAMIN ADULT PO), Take 1 tablet by mouth daily., Disp: , Rfl:    nitroGLYCERIN (NITROSTAT) 0.4 MG SL tablet, Place 1 tablet (0.4 mg total) under the tongue every 5 (five) minutes as needed for chest pain., Disp: 25 tablet, Rfl: 1   ONETOUCH VERIO test strip, 1 each 2 (two) times daily., Disp: , Rfl:    Probiotic Product (ALIGN) 4 MG CAPS, Take 4 mg by mouth daily., Disp: , Rfl:    ranolazine (RANEXA) 500 MG 12 hr tablet, Take 1 tablet (500 mg total) by mouth 2 (two) times daily., Disp: 180 tablet, Rfl: 0   Vonoprazan Fumarate (VOQUEZNA) 20 MG TABS, Take by mouth., Disp: , Rfl:   Orders Placed This Encounter  Procedures   EKG 12-Lead    There are no Patient Instructions on file for this visit.   --Continue cardiac medications as reconciled in final medication list. --Return  in about 2 weeks (around 01/15/2023) for Follow up, CAD. or sooner if needed. --Continue follow-up with your primary care physician regarding the management of your other chronic comorbid conditions.  Patient's questions and concerns  were addressed to her satisfaction. She voices understanding of the instructions provided during this encounter.   This note was created using a voice recognition software as a result there may be grammatical errors inadvertently enclosed that do not reflect the nature of this encounter. Every attempt is made to correct such errors.  Tessa Lerner, [place], [facility]  Pager:  [phone] Office: [phone]

## 2023-01-02 ENCOUNTER — Other Ambulatory Visit (HOSPITAL_COMMUNITY): Payer: Self-pay

## 2023-01-02 ENCOUNTER — Telehealth: Payer: Self-pay

## 2023-01-02 NOTE — Telephone Encounter (Signed)
[facility] called to confirm medications. I reviewed list with pharmacist.

## 2023-01-03 ENCOUNTER — Ambulatory Visit
Admission: RE | Admit: 2023-01-03 | Discharge: 2023-01-03 | Disposition: A | Discharge status: Home / Self Care | Payer: Medicare Other | Source: Ambulatory Visit | Attending: Family Medicine | Admitting: Family Medicine

## 2023-01-03 DIAGNOSIS — Z1231 Encounter for screening mammogram for malignant neoplasm of breast: Secondary | ICD-10-CM | POA: Diagnosis not present

## 2023-01-07 DIAGNOSIS — E039 Hypothyroidism, unspecified: Secondary | ICD-10-CM | POA: Diagnosis not present

## 2023-01-07 DIAGNOSIS — Z794 Long term (current) use of insulin: Secondary | ICD-10-CM | POA: Diagnosis not present

## 2023-01-07 DIAGNOSIS — E1169 Type 2 diabetes mellitus with other specified complication: Secondary | ICD-10-CM | POA: Diagnosis not present

## 2023-01-07 DIAGNOSIS — D649 Anemia, unspecified: Secondary | ICD-10-CM | POA: Diagnosis not present

## 2023-01-07 DIAGNOSIS — I110 Hypertensive heart disease with heart failure: Secondary | ICD-10-CM | POA: Diagnosis not present

## 2023-01-07 DIAGNOSIS — I222 Subsequent non-ST elevation (NSTEMI) myocardial infarction: Secondary | ICD-10-CM | POA: Diagnosis not present

## 2023-01-07 DIAGNOSIS — I700 Atherosclerosis of aorta: Secondary | ICD-10-CM | POA: Diagnosis not present

## 2023-01-07 DIAGNOSIS — E782 Mixed hyperlipidemia: Secondary | ICD-10-CM | POA: Diagnosis not present

## 2023-01-07 DIAGNOSIS — F419 Anxiety disorder, unspecified: Secondary | ICD-10-CM | POA: Diagnosis not present

## 2023-01-07 DIAGNOSIS — I25110 Atherosclerotic heart disease of native coronary artery with unstable angina pectoris: Secondary | ICD-10-CM | POA: Diagnosis not present

## 2023-01-07 DIAGNOSIS — I5032 Chronic diastolic (congestive) heart failure: Secondary | ICD-10-CM | POA: Diagnosis not present

## 2023-01-07 DIAGNOSIS — E876 Hypokalemia: Secondary | ICD-10-CM | POA: Diagnosis not present

## 2023-01-07 DIAGNOSIS — I214 Non-ST elevation (NSTEMI) myocardial infarction: Secondary | ICD-10-CM | POA: Diagnosis not present

## 2023-01-07 DIAGNOSIS — G4733 Obstructive sleep apnea (adult) (pediatric): Secondary | ICD-10-CM | POA: Diagnosis not present

## 2023-01-07 DIAGNOSIS — K219 Gastro-esophageal reflux disease without esophagitis: Secondary | ICD-10-CM | POA: Diagnosis not present

## 2023-01-07 DIAGNOSIS — E1165 Type 2 diabetes mellitus with hyperglycemia: Secondary | ICD-10-CM | POA: Diagnosis not present

## 2023-01-08 ENCOUNTER — Ambulatory Visit: Payer: Medicare Other | Admitting: Cardiology

## 2023-01-09 DIAGNOSIS — I222 Subsequent non-ST elevation (NSTEMI) myocardial infarction: Secondary | ICD-10-CM | POA: Diagnosis not present

## 2023-01-09 DIAGNOSIS — I5032 Chronic diastolic (congestive) heart failure: Secondary | ICD-10-CM | POA: Diagnosis not present

## 2023-01-09 DIAGNOSIS — G4733 Obstructive sleep apnea (adult) (pediatric): Secondary | ICD-10-CM | POA: Diagnosis not present

## 2023-01-09 DIAGNOSIS — E039 Hypothyroidism, unspecified: Secondary | ICD-10-CM | POA: Diagnosis not present

## 2023-01-09 DIAGNOSIS — E782 Mixed hyperlipidemia: Secondary | ICD-10-CM | POA: Diagnosis not present

## 2023-01-09 DIAGNOSIS — E1165 Type 2 diabetes mellitus with hyperglycemia: Secondary | ICD-10-CM | POA: Diagnosis not present

## 2023-01-09 DIAGNOSIS — I110 Hypertensive heart disease with heart failure: Secondary | ICD-10-CM | POA: Diagnosis not present

## 2023-01-09 DIAGNOSIS — K219 Gastro-esophageal reflux disease without esophagitis: Secondary | ICD-10-CM | POA: Diagnosis not present

## 2023-01-09 DIAGNOSIS — E1169 Type 2 diabetes mellitus with other specified complication: Secondary | ICD-10-CM | POA: Diagnosis not present

## 2023-01-09 DIAGNOSIS — I700 Atherosclerosis of aorta: Secondary | ICD-10-CM | POA: Diagnosis not present

## 2023-01-09 DIAGNOSIS — Z794 Long term (current) use of insulin: Secondary | ICD-10-CM | POA: Diagnosis not present

## 2023-01-09 DIAGNOSIS — I214 Non-ST elevation (NSTEMI) myocardial infarction: Secondary | ICD-10-CM | POA: Diagnosis not present

## 2023-01-09 DIAGNOSIS — F419 Anxiety disorder, unspecified: Secondary | ICD-10-CM | POA: Diagnosis not present

## 2023-01-09 DIAGNOSIS — I25110 Atherosclerotic heart disease of native coronary artery with unstable angina pectoris: Secondary | ICD-10-CM | POA: Diagnosis not present

## 2023-01-09 DIAGNOSIS — D649 Anemia, unspecified: Secondary | ICD-10-CM | POA: Diagnosis not present

## 2023-01-09 DIAGNOSIS — E876 Hypokalemia: Secondary | ICD-10-CM | POA: Diagnosis not present

## 2023-01-11 DIAGNOSIS — I214 Non-ST elevation (NSTEMI) myocardial infarction: Secondary | ICD-10-CM | POA: Diagnosis not present

## 2023-01-11 DIAGNOSIS — I459 Conduction disorder, unspecified: Secondary | ICD-10-CM | POA: Diagnosis not present

## 2023-01-11 DIAGNOSIS — E1165 Type 2 diabetes mellitus with hyperglycemia: Secondary | ICD-10-CM | POA: Diagnosis not present

## 2023-01-11 DIAGNOSIS — I442 Atrioventricular block, complete: Secondary | ICD-10-CM | POA: Diagnosis not present

## 2023-01-11 DIAGNOSIS — F419 Anxiety disorder, unspecified: Secondary | ICD-10-CM | POA: Diagnosis not present

## 2023-01-11 DIAGNOSIS — E876 Hypokalemia: Secondary | ICD-10-CM | POA: Diagnosis not present

## 2023-01-11 DIAGNOSIS — I25110 Atherosclerotic heart disease of native coronary artery with unstable angina pectoris: Secondary | ICD-10-CM | POA: Diagnosis not present

## 2023-01-11 DIAGNOSIS — I2510 Atherosclerotic heart disease of native coronary artery without angina pectoris: Secondary | ICD-10-CM | POA: Diagnosis not present

## 2023-01-11 DIAGNOSIS — D649 Anemia, unspecified: Secondary | ICD-10-CM | POA: Diagnosis not present

## 2023-01-11 DIAGNOSIS — I700 Atherosclerosis of aorta: Secondary | ICD-10-CM | POA: Diagnosis not present

## 2023-01-11 DIAGNOSIS — I5032 Chronic diastolic (congestive) heart failure: Secondary | ICD-10-CM | POA: Diagnosis not present

## 2023-01-11 DIAGNOSIS — I110 Hypertensive heart disease with heart failure: Secondary | ICD-10-CM | POA: Diagnosis not present

## 2023-01-11 DIAGNOSIS — K219 Gastro-esophageal reflux disease without esophagitis: Secondary | ICD-10-CM | POA: Diagnosis not present

## 2023-01-11 DIAGNOSIS — E782 Mixed hyperlipidemia: Secondary | ICD-10-CM | POA: Diagnosis not present

## 2023-01-11 DIAGNOSIS — E1169 Type 2 diabetes mellitus with other specified complication: Secondary | ICD-10-CM | POA: Diagnosis not present

## 2023-01-11 DIAGNOSIS — I222 Subsequent non-ST elevation (NSTEMI) myocardial infarction: Secondary | ICD-10-CM | POA: Diagnosis not present

## 2023-01-11 DIAGNOSIS — Z951 Presence of aortocoronary bypass graft: Secondary | ICD-10-CM | POA: Diagnosis not present

## 2023-01-11 DIAGNOSIS — E039 Hypothyroidism, unspecified: Secondary | ICD-10-CM | POA: Diagnosis not present

## 2023-01-11 DIAGNOSIS — G4733 Obstructive sleep apnea (adult) (pediatric): Secondary | ICD-10-CM | POA: Diagnosis not present

## 2023-01-11 DIAGNOSIS — Z794 Long term (current) use of insulin: Secondary | ICD-10-CM | POA: Diagnosis not present

## 2023-01-15 DIAGNOSIS — E876 Hypokalemia: Secondary | ICD-10-CM | POA: Diagnosis not present

## 2023-01-15 DIAGNOSIS — D649 Anemia, unspecified: Secondary | ICD-10-CM | POA: Diagnosis not present

## 2023-01-15 DIAGNOSIS — I700 Atherosclerosis of aorta: Secondary | ICD-10-CM | POA: Diagnosis not present

## 2023-01-15 DIAGNOSIS — I214 Non-ST elevation (NSTEMI) myocardial infarction: Secondary | ICD-10-CM | POA: Diagnosis not present

## 2023-01-15 DIAGNOSIS — E1165 Type 2 diabetes mellitus with hyperglycemia: Secondary | ICD-10-CM | POA: Diagnosis not present

## 2023-01-15 DIAGNOSIS — G4733 Obstructive sleep apnea (adult) (pediatric): Secondary | ICD-10-CM | POA: Diagnosis not present

## 2023-01-15 DIAGNOSIS — I110 Hypertensive heart disease with heart failure: Secondary | ICD-10-CM | POA: Diagnosis not present

## 2023-01-15 DIAGNOSIS — I222 Subsequent non-ST elevation (NSTEMI) myocardial infarction: Secondary | ICD-10-CM | POA: Diagnosis not present

## 2023-01-15 DIAGNOSIS — K219 Gastro-esophageal reflux disease without esophagitis: Secondary | ICD-10-CM | POA: Diagnosis not present

## 2023-01-15 DIAGNOSIS — Z794 Long term (current) use of insulin: Secondary | ICD-10-CM | POA: Diagnosis not present

## 2023-01-15 DIAGNOSIS — E1169 Type 2 diabetes mellitus with other specified complication: Secondary | ICD-10-CM | POA: Diagnosis not present

## 2023-01-15 DIAGNOSIS — F419 Anxiety disorder, unspecified: Secondary | ICD-10-CM | POA: Diagnosis not present

## 2023-01-15 DIAGNOSIS — I25110 Atherosclerotic heart disease of native coronary artery with unstable angina pectoris: Secondary | ICD-10-CM | POA: Diagnosis not present

## 2023-01-15 DIAGNOSIS — E782 Mixed hyperlipidemia: Secondary | ICD-10-CM | POA: Diagnosis not present

## 2023-01-15 DIAGNOSIS — I5032 Chronic diastolic (congestive) heart failure: Secondary | ICD-10-CM | POA: Diagnosis not present

## 2023-01-15 DIAGNOSIS — E039 Hypothyroidism, unspecified: Secondary | ICD-10-CM | POA: Diagnosis not present

## 2023-01-17 DIAGNOSIS — I214 Non-ST elevation (NSTEMI) myocardial infarction: Secondary | ICD-10-CM | POA: Diagnosis not present

## 2023-01-17 DIAGNOSIS — I700 Atherosclerosis of aorta: Secondary | ICD-10-CM | POA: Diagnosis not present

## 2023-01-17 DIAGNOSIS — E782 Mixed hyperlipidemia: Secondary | ICD-10-CM | POA: Diagnosis not present

## 2023-01-17 DIAGNOSIS — I5032 Chronic diastolic (congestive) heart failure: Secondary | ICD-10-CM | POA: Diagnosis not present

## 2023-01-17 DIAGNOSIS — E1165 Type 2 diabetes mellitus with hyperglycemia: Secondary | ICD-10-CM | POA: Diagnosis not present

## 2023-01-17 DIAGNOSIS — F419 Anxiety disorder, unspecified: Secondary | ICD-10-CM | POA: Diagnosis not present

## 2023-01-17 DIAGNOSIS — I25110 Atherosclerotic heart disease of native coronary artery with unstable angina pectoris: Secondary | ICD-10-CM | POA: Diagnosis not present

## 2023-01-17 DIAGNOSIS — E039 Hypothyroidism, unspecified: Secondary | ICD-10-CM | POA: Diagnosis not present

## 2023-01-17 DIAGNOSIS — D649 Anemia, unspecified: Secondary | ICD-10-CM | POA: Diagnosis not present

## 2023-01-17 DIAGNOSIS — I222 Subsequent non-ST elevation (NSTEMI) myocardial infarction: Secondary | ICD-10-CM | POA: Diagnosis not present

## 2023-01-17 DIAGNOSIS — G4733 Obstructive sleep apnea (adult) (pediatric): Secondary | ICD-10-CM | POA: Diagnosis not present

## 2023-01-17 DIAGNOSIS — E876 Hypokalemia: Secondary | ICD-10-CM | POA: Diagnosis not present

## 2023-01-17 DIAGNOSIS — E1169 Type 2 diabetes mellitus with other specified complication: Secondary | ICD-10-CM | POA: Diagnosis not present

## 2023-01-17 DIAGNOSIS — K219 Gastro-esophageal reflux disease without esophagitis: Secondary | ICD-10-CM | POA: Diagnosis not present

## 2023-01-17 DIAGNOSIS — Z794 Long term (current) use of insulin: Secondary | ICD-10-CM | POA: Diagnosis not present

## 2023-01-17 DIAGNOSIS — I110 Hypertensive heart disease with heart failure: Secondary | ICD-10-CM | POA: Diagnosis not present

## 2023-01-21 ENCOUNTER — Encounter: Payer: Self-pay | Admitting: Cardiology

## 2023-01-21 ENCOUNTER — Ambulatory Visit: Payer: Medicare Other | Admitting: Cardiology

## 2023-01-21 ENCOUNTER — Telehealth: Payer: Medicare Other

## 2023-01-21 VITALS — BP 149/78 | HR 84 | Ht 61.0 in | Wt 148.2 lb

## 2023-01-21 DIAGNOSIS — E118 Type 2 diabetes mellitus with unspecified complications: Secondary | ICD-10-CM

## 2023-01-21 DIAGNOSIS — Z955 Presence of coronary angioplasty implant and graft: Secondary | ICD-10-CM

## 2023-01-21 DIAGNOSIS — Z951 Presence of aortocoronary bypass graft: Secondary | ICD-10-CM

## 2023-01-21 DIAGNOSIS — G4733 Obstructive sleep apnea (adult) (pediatric): Secondary | ICD-10-CM

## 2023-01-21 DIAGNOSIS — I25118 Atherosclerotic heart disease of native coronary artery with other forms of angina pectoris: Secondary | ICD-10-CM | POA: Diagnosis not present

## 2023-01-21 DIAGNOSIS — E785 Hyperlipidemia, unspecified: Secondary | ICD-10-CM

## 2023-01-21 DIAGNOSIS — I214 Non-ST elevation (NSTEMI) myocardial infarction: Secondary | ICD-10-CM

## 2023-01-21 DIAGNOSIS — I10 Essential (primary) hypertension: Secondary | ICD-10-CM

## 2023-01-21 MED ORDER — CILOSTAZOL 50 MG PO TABS
50.0000 mg | ORAL_TABLET | Freq: Two times a day (BID) | ORAL | 3 refills | Status: DC
Start: 2023-01-21 — End: 2023-04-23

## 2023-01-21 MED ORDER — CLOPIDOGREL BISULFATE 75 MG PO TABS
75.0000 mg | ORAL_TABLET | Freq: Every day | ORAL | 3 refills | Status: DC
Start: 2023-01-21 — End: 2023-04-23

## 2023-01-21 MED ORDER — RANOLAZINE ER 500 MG PO TB12
500.0000 mg | ORAL_TABLET | Freq: Two times a day (BID) | ORAL | 0 refills | Status: DC
Start: 2023-01-21 — End: 2023-04-01

## 2023-01-21 NOTE — Progress Notes (Signed)
ID:  Susan Barnett, DOB 11-08-1945, MRN [id number]  PCP:  Susan Blamer, MD  Cardiologist:  Susan Lerner, DO, [facility] (established care 11/30/2022) Former Cardiology Providers: Dr. Verdis Barnett Sleep medicine provider: Dr. Carolanne Barnett  Date: 01/21/23 Last Office Visit: 01/01/2023  Chief Complaint  Patient presents with   Follow-up    Reevaluation of chest pain, recent NSTEMI, recent heart catheterization/coronary intervention    HPI  Susan Barnett is a 77 y.o. Caucasian female whose past medical history and cardiovascular risk factors include: CAD status post CABG 2015 (SVG to diagonal and SVG to PDA) and s/p PCI to LCX (11/2022), hypertension, diabetes mellitus type 2, hyperlipidemia, OSA  Patient was initially referred to the office for an expedited evaluation for chest pain and given her history of CAD/CABG.  Her symptoms were concerning for angina pectoris underwent heart catheterization on 12/04/2022.  During initial catheterization SVG to the diagonal branch was not evaluated due to conduction disease &  patient needed a TVP placement and was being monitored in ICU.  She did not need permanent pacemaker.  And prior to discharge a repeat heart catheterization to reevaluate SVG to diagonal branch was recommended but patient refused.  Postdischarge she comes to the office for worsening chest pain.  Due to concerns for unstable angina she underwent catheterization on 12/10/2022 and she underwent PCI to the LCx.  She was later discharged home.  She later presents to the hospital with similar anginal discomfort but less severe.  Her high sensitive troponins were elevated and she ruled in for NSTEMI.  She underwent third heart catheterization on 12/24/2022 which noted patent stents and moderate/severe disease and small caliber vessels no significant change compared to prior studies and no specific culprit vessel was identified per report.  Patient presents today for follow-up and accompanied  by her friend.  She no longer is experiencing chest pain on current medical regimen.  She was having morning headaches which have improved with Tylenol use.  She is taking amlodipine and hydrochlorothiazide if her home systolic blood pressures are >130 mmHg.  Home blood pressure log reviewed majority of the readings are around-30 mmHg.  She has not started cardiac rehab yet.  She is not compliant with her CPAP use.    ALLERGIES: Allergies  Allergen Reactions   Asa [Aspirin] Anaphylaxis   Compazine [Prochlorperazine Edisylate] Swelling and Other (See Comments)    Tongue swelling    Nsaids Anaphylaxis   Shellfish Allergy Hives and Swelling   Sulfa Antibiotics Hives and Swelling   Zantac [Ranitidine Hcl] Hives and Swelling   Lisinopril Cough    MEDICATION LIST PRIOR TO VISIT: Current Meds  Medication Sig   acetaminophen (TYLENOL) 650 MG CR tablet Take 1,300 mg by mouth at bedtime.   amLODipine (NORVASC) 5 MG tablet Take 5 mg by mouth daily as needed (Take if SBP >156mmHG).   atorvastatin (LIPITOR) 40 MG tablet Take 1 tablet (40 mg total) by mouth daily.   CALCIUM PO Take 1 tablet by mouth in the morning and at bedtime.   Cetirizine HCl (ZYRTEC ALLERGY) 10 MG CAPS Take 1 tablet by mouth daily.   Coenzyme Q10 300 MG CAPS Take 300 mg by mouth 2 (two) times daily.   D-Mannose 500 MG CAPS Take 500 mg by mouth in the morning and at bedtime.   empagliflozin (JARDIANCE) 10 MG TABS tablet Take 1 tablet (10 mg total) by mouth daily.   hydrochlorothiazide (HYDRODIURIL) 12.5 MG tablet Take 12.5 mg by  mouth daily as needed (Take if SBP>19mmHG.).   LANTUS SOLOSTAR 100 UNIT/ML Solostar Pen Inject 10 Units into the skin at bedtime. (Patient taking differently: Inject 40 Units into the skin at bedtime.)   levothyroxine (SYNTHROID, LEVOTHROID) 75 MCG tablet Take 75 mcg by mouth daily before breakfast.    losartan (COZAAR) 100 MG tablet Take 1 tablet (100 mg total) by mouth daily.   metoprolol  tartrate (LOPRESSOR) 50 MG tablet Take 1 tablet (50 mg total) by mouth 2 (two) times daily.   Multiple Vitamin (MULTIVITAMIN ADULT PO) Take 1 tablet by mouth daily.   nitroGLYCERIN (NITROSTAT) 0.4 MG SL tablet Place 1 tablet (0.4 mg total) under the tongue every 5 (five) minutes as needed for chest pain.   ONETOUCH VERIO test strip 1 each 2 (two) times daily.   Probiotic Product (ALIGN) 4 MG CAPS Take 4 mg by mouth daily.   Vonoprazan Fumarate (VOQUEZNA) 20 MG TABS Take by mouth.   [DISCONTINUED] cilostazol (PLETAL) 50 MG tablet Take 1 tablet (50 mg total) by mouth 2 (two) times daily.   [DISCONTINUED] clopidogrel (PLAVIX) 75 MG tablet Take 1 tablet (75 mg total) by mouth daily.   [DISCONTINUED] ranolazine (RANEXA) 500 MG 12 hr tablet Take 1 tablet (500 mg total) by mouth 2 (two) times daily.     PAST MEDICAL HISTORY: Past Medical History:  Diagnosis Date   Anemia    mild   Anginal pain (HCC)    with exertion, last time prior to cardiac caht 06/02/14   Anxiety    situational   Arthritis    Breast cancer (HCC) 2018   Left Breast Cancer   CAD (coronary artery disease)    Dr. Mendel Ryder follows, no problems now   Cancer [facility])    Breast cancer   Complication of anesthesia    Diabetes mellitus, type 2 (HCC)    TYPE 2   Dysrhythmia    occ skips a beat, rate was fast before beta blocker.   Gastric polyp    Genetic testing 04/12/2017   Ms. Leatherwood underwent genetic counseling and testing for hereditary cancer syndromes on 02/19/2017. Her results were negative for mutations in all 46 genes analyzed by [facility]'s 46-gene Common Hereditary Cancers Panel. Genes analyzed include: APC, ATM, AXIN2, BARD1, BMPR1A, BRCA1, BRCA2, BRIP1, CDH1, CDKN2A, CHEK2, CTNNA1, DICER1, EPCAM, GREM1, HOXB13, KIT, MEN1, MLH1, MSH2, MSH3, MSH6, MUTYH, NBN   GERD (gastroesophageal reflux disease)    Headache    hx of   Heartburn    Hiatal hernia    mild head tremors   History of radiation therapy 01/30/17- 02/21/17    Left Breast 42.56 Gy in 16 fractions   Hypertension    Hypothyroidism    Occasional tremors    "of head", slight hands   Osteopenia    Personal history of radiation therapy 2018   Left Breast Cancer   Pneumonia    PONV (postoperative nausea and vomiting) 02/24/14   Shortness of breath    with exertion   Sleep apnea    cpap used nightly x 2 yrs now.    PAST SURGICAL HISTORY: Past Surgical History:  Procedure Laterality Date   BREAST BIOPSY Left 1987   BREAST BIOPSY Left 2019   BREAST LUMPECTOMY Left 01/25/2017   BREAST LUMPECTOMY WITH RADIOACTIVE SEED AND SENTINEL LYMPH NODE BIOPSY Left 12/25/2016   Procedure: BREAST LUMPECTOMY WITH RADIOACTIVE SEED AND SENTINEL LYMPH NODE BIOPSY;  Surgeon: Glenna Fellows, MD;  Location: [facility] OR;  Service: General;  Laterality: Left;   CARDIAC CATHETERIZATION  06/02/2014   LAD 30%, D1 80%, CFX 755, RCA > 95% ISR, rx w/ PTCA, heavy calcification, EF 65%   CORONARY ARTERY BYPASS GRAFT N/A 06/14/2014   Procedure: CORONARY ARTERY BYPASS GRAFTING (CABG);  Surgeon: Alleen Borne, MD;  Location: [facility] OR;  Service: Open Heart Surgery;  Laterality: N/A;  Times 2 using endoscopically harvested right saphenous vein.   CORONARY STENT INTERVENTION N/A 12/10/2022   Procedure: CORONARY STENT INTERVENTION;  Surgeon: Elder Negus, MD;  Location: [facility] INVASIVE CV LAB;  Service: Cardiovascular;  Laterality: N/A;   coronary stents     x3 stents placed-prior ablation   CORONARY ULTRASOUND/IVUS N/A 12/10/2022   Procedure: Coronary Ultrasound/IVUS;  Surgeon: Elder Negus, MD;  Location: [facility] INVASIVE CV LAB;  Service: Cardiovascular;  Laterality: N/A;   DILATION AND CURETTAGE OF UTERUS     ELBOW FRACTURE SURGERY Left    INCONTINENCE SURGERY     sling   KNEE ARTHROSCOPY Right 02/24/2014   Procedure: RIGHT KNEE ARTHROSCOPY WITH DEBRIDEMENT ;  Surgeon: Loanne Drilling, MD;  Location: [facility] ORS;  Service: Orthopedics;  Laterality: Right;   LEFT HEART CATH AND  CORS/GRAFTS ANGIOGRAPHY N/A 12/03/2022   Procedure: LEFT HEART CATH AND CORS/GRAFTS ANGIOGRAPHY;  Surgeon: Elder Negus, MD;  Location: [facility] INVASIVE CV LAB;  Service: Cardiovascular;  Laterality: N/A;   LEFT HEART CATH AND CORS/GRAFTS ANGIOGRAPHY N/A 12/10/2022   Procedure: LEFT HEART CATH AND CORS/GRAFTS ANGIOGRAPHY;  Surgeon: Elder Negus, MD;  Location: [facility] INVASIVE CV LAB;  Service: Cardiovascular;  Laterality: N/A;   LEFT HEART CATH AND CORS/GRAFTS ANGIOGRAPHY N/A 12/24/2022   Procedure: LEFT HEART CATH AND CORS/GRAFTS ANGIOGRAPHY;  Surgeon: Elder Negus, MD;  Location: [facility] INVASIVE CV LAB;  Service: Cardiovascular;  Laterality: N/A;   LEFT HEART CATHETERIZATION WITH CORONARY ANGIOGRAM N/A 06/02/2014   Procedure: LEFT HEART CATHETERIZATION WITH CORONARY ANGIOGRAM;  Surgeon: Lesleigh Noe, MD;  Location: [facility] CATH LAB;  Service: Cardiovascular;  Laterality: N/A;   TEE WITHOUT CARDIOVERSION N/A 06/14/2014   Procedure: TRANSESOPHAGEAL ECHOCARDIOGRAM (TEE);  Surgeon: Alleen Borne, MD;  Location: [facility] OR;  Service: Open Heart Surgery;  Laterality: N/A;   TEMPORARY PACEMAKER N/A 12/03/2022   Procedure: TEMPORARY PACEMAKER;  Surgeon: Elder Negus, MD;  Location: [facility] INVASIVE CV LAB;  Service: Cardiovascular;  Laterality: N/A;    FAMILY HISTORY: The patient family history includes Breast cancer (age of onset: 63) in her cousin; Diabetes in her mother; Healthy in her brother; Heart attack in her mother; Hodgkin's lymphoma in her maternal uncle; Hypertension in her mother; Lung cancer in her brother; Ulcerative colitis in her daughter.  SOCIAL HISTORY:  The patient  reports that she has never smoked. She has never used smokeless tobacco. She reports that she does not drink alcohol and does not use drugs.  REVIEW OF SYSTEMS: Review of Systems  Cardiovascular:  Negative for chest pain, claudication, dyspnea on exertion, irregular heartbeat, leg swelling, near-syncope,  orthopnea, palpitations, paroxysmal nocturnal dyspnea and syncope.  Respiratory:  Negative for shortness of breath.   Hematologic/Lymphatic: Negative for bleeding problem.  Musculoskeletal:  Negative for muscle cramps and myalgias.  Gastrointestinal:  Positive for heartburn.  Neurological:  Negative for dizziness and light-headedness.    PHYSICAL EXAM:    01/21/2023    3:03 PM 01/21/2023    2:58 PM 01/01/2023   11:02 AM  Vitals with BMI  Height  5\' 1"    Weight  148 lbs  3 oz   BMI  28.02   Systolic 149 160 92  Diastolic 78 74 56  Pulse 84 85 78    Physical Exam  Constitutional: No distress.  Age appropriate, hemodynamically stable.   Neck: No JVD present.  Cardiovascular: Normal rate, regular rhythm, S1 normal, S2 normal, intact distal pulses and normal pulses. Exam reveals no gallop, no S3 and no S4.  No murmur heard. Pulmonary/Chest: Effort normal and breath sounds normal. No stridor. She has no wheezes. She has no rales.  Abdominal: Soft. Bowel sounds are normal. She exhibits no distension. There is no abdominal tenderness.  Musculoskeletal:        General: No edema.     Cervical back: Neck supple.  Neurological: She is alert and oriented to person, place, and time. She has intact cranial nerves (2-12).  Skin: Skin is warm and moist.   CARDIAC DATABASE: EKG: Jan 01, 2023: Sinus rhythm, 86 bpm, LAE, old inferior infarct.  Compared to prior tracing 12/20/2022 TWI inferior leads are not present and ventricular rate is higher.  Echocardiogram: 09/01/2018: LVEF 55 to 60%, grade 2 diastolic dysfunction, trivial AR, mild MR, mild left atrial enlargement.  December 03, 2022:  1. Left ventricular ejection fraction, by estimation, is 60 to 65%. The  left ventricle has normal function. There is mild concentric left  ventricular hypertrophy. Left ventricular diastolic parameters are  consistent with Grade II diastolic dysfunction  (pseudonormalization). On the 2-chamber contrast images  there appears to  be a small segment of akinesis in the basilar to mid inferior wall. This  is not seen in any other images.   2. Right ventricular systolic function is normal. The right ventricular  size is normal. There is normal pulmonary artery systolic pressure.   3. Left atrial size was mildly dilated.   4. The mitral valve is normal in structure. Mild mitral valve  regurgitation. No evidence of mitral stenosis.   5. The aortic valve is tricuspid. There is mild calcification of the  aortic valve. Aortic valve regurgitation is trivial. No aortic stenosis is  present.   6. The inferior vena cava is normal in size with <50% respiratory  variability, suggesting right atrial pressure of 8 mmHg.   Stress Testing: MPI 05/18/2020  Nuclear stress EF: 72%. No wall motion abnormalities.  There was no ST segment deviation noted during stress.  The study is normal.  This is a low risk study. No evidence of ischemia or infarction. Prior CABG.  Heart Catheterization: December 03, 2022: LM: Normal LAD: Prox-mid 30% diffuse disease, distal 75% diffuse disease Lcx: Mid 75% diffuse disease RCA: Prox 80% diffuse disease, followed 100 mid ISR SVG-PDA: Patent SVG-diag: Could not be visualized   Temporary transvenous pacemaker placement.   Patient was very confused and kept attempting to sit up.  I did not feel leaving femoral sheath in place was safe.  Therefore, I decided to go ahead and place 5 [place] Mynx closure.   With transient hypotension, patient was very confused and disoriented.  Code stroke was called.  Patient was evaluated by neurology and thought to be less likely to have had stroke.  CT head was ordered out of abundance caution.  Patient is mental alertness improved with time.  She will be kept into heart ICU overnight for monitoring.  Temporary pacemaker will be removed tomorrow morning if no further use is needed.   Unusually prolonged procedure time due to intraprocedural  hypotension, complete AV block requiring placement of temporary  transvenous placement, disorientation requiring further neurological evaluation.  December 10, 2022: LM: Mild disease LAD: Prox-mid 30% diffuse disease, distal 75% diffuse disease Lcx: Prox 30%, mid 75% diffuse disease, followed by focal 90% stenosis RCA: Not engaged today. Known Prox 80% diffuse disease, followed 100 mid ISR SVG-PDA: Not engaged today. Known patent SVG-diag: Patent. No significant disease   LVEDP 17 mmHg   Successful percutaneous coronary intervention mid LCx        PTCA and stent placement with overlapping stents        2.0 X 18 mm and 2.25 X 15 Onyx drug-eluting stent        Post dilatation with 2.25 mm stent balloon and 2.5X15 mm [place] balloon up to 16 atm   12/24/2022:   The left ventricular ejection fraction is 55-65% by visual estimate.   LM: Normal LAD: Prox-mid 30% disease      Severe moderate disease in small caliber mid to distal LAD LCx: Prox 30% disease      Patent mid LCx stents RCA: Prox 80% disease, mid occlusion SVG-PDA: Patent. Slow retrograde flow into PLA through lesion in ostial PDA. FLow in PLA is somewhat better than 12/10/2022 SVG-Diag: PAtent with diffuse moderate disease, uncahnged from 12/03/2022   LVEDP and LVEF normal   Multiple areas of small caliber vessel moderate to severe disease, none worse with 12/03/2022 and 12/10/2022 caths. No specific culprit vessel identified that is amenable to any percutaneous or surgical intervention. Recommend continued medical management.    Recommend plavix and pletal 50 mg bid for antiplatelet regimen. Recommend adding Ranexa 500 mg bid for small vessel disease.  Cardiac monitor [facility] Patch): December 06, 2022 -Dec 20, 2022 Dominant rhythm sinus with first-degree AV block. Heart rate 55-179 bpm. Avg HR 75 bpm. No atrial fibrillation, high grade AV block, pauses (3 seconds or longer). Total ventricular ectopic burden <1%. One asymptomatic episode  of NSVT (5 beats, 2.6 seconds, max HR 179 bpm).  Total supraventricular ectopic burden <1%. Rare brief episodes of PSVT Patient triggered events: 9. Underlying rhythm sinus with rare PVCs.   LABORATORY DATA:    Latest Ref Rng & Units 12/25/2022   11:06 AM 12/23/2022    5:18 AM 12/11/2022    2:25 AM  CBC  WBC 4.0 - 10.5 K/uL 10.4  13.3  14.2   Hemoglobin 12.0 - 15.0 g/dL 16.1  09.6  04.5   Hematocrit 36.0 - 46.0 % 38.5  39.8  36.0   Platelets 150 - 400 K/uL 356  427  391        Latest Ref Rng & Units 12/25/2022   11:06 AM 12/23/2022    5:18 AM 12/11/2022    2:25 AM  CMP  Glucose 70 - 99 mg/dL 409  811  914   BUN 8 - 23 mg/dL 18  16  10    Creatinine 0.44 - 1.00 mg/dL 7.82  9.56  2.13   Sodium 135 - 145 mmol/L 132  134  134   Potassium 3.5 - 5.1 mmol/L 4.9  3.7  2.8   Chloride 98 - 111 mmol/L 103  100  101   CO2 22 - 32 mmol/L 19  22  22    Calcium 8.9 - 10.3 mg/dL 8.7  9.8  8.7     Lipid Panel     Component Value Date/Time   CHOL 114 12/05/2022 1057   TRIG 149 12/05/2022 1057   HDL 39 (L) 12/05/2022 1057   CHOLHDL 2.9 12/05/2022 1057  VLDL 30 12/05/2022 1057   LDLCALC 45 12/05/2022 1057   LDLDIRECT 51 12/05/2022 1057    No components found for: "NTPROBNP" Recent Labs    11/30/22 1154  PROBNP 793*   Recent Labs    12/03/22 0540  TSH 0.381    BMP Recent Labs    12/11/22 0225 12/23/22 0518 12/25/22 1106  NA 134* 134* 132*  K 2.8* 3.7 4.9  CL 101 100 103  CO2 22 22 19*  GLUCOSE 102* 250* 161*  BUN 10 16 18   CREATININE 0.65 1.02* 0.86  CALCIUM 8.7* 9.8 8.7*  GFRNONAA >60 57* >60    HEMOGLOBIN A1C Lab Results  Component Value Date   HGBA1C 8.7 (H) 12/03/2022   MPG 202.99 12/03/2022   External Labs: Collected: 07/26/2022 Hemoglobin A1c 7.6.  Collected: 03/27/2022 Sodium 137, potassium 4.8, chloride 104, bicarb 27. AST 17, ALT 16, alkaline phosphatase 55. BUN 11, creatinine 0.8. Hemoglobin 13.8, hematocrit 39.2%. Total cholesterol 135,  triglycerides 160, HDL 38, LDL 65, non-HDL 97. TSH 0.17  IMPRESSION:    ICD-10-CM   1. Coronary artery disease of native artery of native heart with stable angina pectoris (HCC)  I25.118 ranolazine (RANEXA) 500 MG 12 hr tablet    clopidogrel (PLAVIX) 75 MG tablet    cilostazol (PLETAL) 50 MG tablet    2. Hx of CABG  Z95.1 ranolazine (RANEXA) 500 MG 12 hr tablet    clopidogrel (PLAVIX) 75 MG tablet    cilostazol (PLETAL) 50 MG tablet    3. S/P drug eluting coronary stent placement  Z95.5 ranolazine (RANEXA) 500 MG 12 hr tablet    clopidogrel (PLAVIX) 75 MG tablet    cilostazol (PLETAL) 50 MG tablet    4. NSTEMI (non-ST elevated myocardial infarction) (HCC)  I21.4 ranolazine (RANEXA) 500 MG 12 hr tablet    clopidogrel (PLAVIX) 75 MG tablet    cilostazol (PLETAL) 50 MG tablet    5. Essential hypertension  I10     6. Type 2 diabetes mellitus with complications (HCC)  E11.8     7. Hyperlipidemia LDL goal <70  E78.5     8. OSA (obstructive sleep apnea)  G47.33        RECOMMENDATIONS: TARIYA BUEHL is a 77 y.o. Caucasian female whose past medical history and cardiac risk factors include: CAD status post CABG 2015 (SVG to diagonal and SVG to PDA) and s/p PCI to LCX (11/2022), hypertension, diabetes mellitus type 2, hyperlipidemia, OSA  NSTEMI (non-ST elevated myocardial infarction) (HCC) Coronary artery disease involving native coronary artery of native heart with stable angina pectoris Hx of CABG S/P drug eluting coronary stent placement Status post PCI to the mid LCx/20 04/2023. Allergic to aspirin. Brilinta was changed to Plavix and cilostazol (tolerating it well) No use off nitroglycerin tablet since hospital discharge. No reoccurrence of chest pain since last visit. Sent from [facility], cilostazol, Plavix upstream pharmacy Reemphasized importance of blood pressure, glycemia, and lipid management Encouraged initiation of cardiac rehab. Reemphasized importance of using CPAP on a  regular basis  Essential hypertension No longer hypotensive. Uses amlodipine and hydrochlorothiazide on as needed basis as she has these medications at her disposal. Is now completely off of clonidine, successfully weaned off Home blood pressure log reviewed.  Reemphasized importance of low-salt diet.  Type 2 diabetes mellitus with complications (HCC) Follows with Dr. Evlyn Kanner. Patient states that sugars are better controlled. Currently on ARB, statin therapy, Jardianc.  Hyperlipidemia LDL goal <55 Currently on atorvastatin.   She denies myalgia  or other side effects. Most recent lipids dated April 2024, independently reviewed as noted above. LDL currently at goal   FINAL MEDICATION LIST END OF ENCOUNTER: Meds ordered this encounter  Medications   ranolazine (RANEXA) 500 MG 12 hr tablet    Sig: Take 1 tablet (500 mg total) by mouth 2 (two) times daily.    Dispense:  180 tablet    Refill:  0   clopidogrel (PLAVIX) 75 MG tablet    Sig: Take 1 tablet (75 mg total) by mouth daily.    Dispense:  90 tablet    Refill:  3   cilostazol (PLETAL) 50 MG tablet    Sig: Take 1 tablet (50 mg total) by mouth 2 (two) times daily.    Dispense:  180 tablet    Refill:  3    Medications Discontinued During This Encounter  Medication Reason   cloNIDine (CATAPRES) 0.1 MG tablet    ranolazine (RANEXA) 500 MG 12 hr tablet Reorder   clopidogrel (PLAVIX) 75 MG tablet Reorder   cilostazol (PLETAL) 50 MG tablet Reorder     Current Outpatient Medications:    acetaminophen (TYLENOL) 650 MG CR tablet, Take 1,300 mg by mouth at bedtime., Disp: , Rfl:    amLODipine (NORVASC) 5 MG tablet, Take 5 mg by mouth daily as needed (Take if SBP >181mmHG)., Disp: , Rfl:    atorvastatin (LIPITOR) 40 MG tablet, Take 1 tablet (40 mg total) by mouth daily., Disp: 30 tablet, Rfl: 2   CALCIUM PO, Take 1 tablet by mouth in the morning and at bedtime., Disp: , Rfl:    Cetirizine HCl (ZYRTEC ALLERGY) 10 MG CAPS, Take 1  tablet by mouth daily., Disp: , Rfl:    Coenzyme Q10 300 MG CAPS, Take 300 mg by mouth 2 (two) times daily., Disp: , Rfl:    D-Mannose 500 MG CAPS, Take 500 mg by mouth in the morning and at bedtime., Disp: , Rfl:    empagliflozin (JARDIANCE) 10 MG TABS tablet, Take 1 tablet (10 mg total) by mouth daily., Disp: 30 tablet, Rfl: 1   hydrochlorothiazide (HYDRODIURIL) 12.5 MG tablet, Take 12.5 mg by mouth daily as needed (Take if SBP>147mmHG.)., Disp: , Rfl:    LANTUS SOLOSTAR 100 UNIT/ML Solostar Pen, Inject 10 Units into the skin at bedtime. (Patient taking differently: Inject 40 Units into the skin at bedtime.), Disp: 15 mL, Rfl: 0   levothyroxine (SYNTHROID, LEVOTHROID) 75 MCG tablet, Take 75 mcg by mouth daily before breakfast. , Disp: , Rfl:    losartan (COZAAR) 100 MG tablet, Take 1 tablet (100 mg total) by mouth daily., Disp: 90 tablet, Rfl: 3   metoprolol tartrate (LOPRESSOR) 50 MG tablet, Take 1 tablet (50 mg total) by mouth 2 (two) times daily., Disp: 60 tablet, Rfl: 1   Multiple Vitamin (MULTIVITAMIN ADULT PO), Take 1 tablet by mouth daily., Disp: , Rfl:    nitroGLYCERIN (NITROSTAT) 0.4 MG SL tablet, Place 1 tablet (0.4 mg total) under the tongue every 5 (five) minutes as needed for chest pain., Disp: 25 tablet, Rfl: 1   ONETOUCH VERIO test strip, 1 each 2 (two) times daily., Disp: , Rfl:    Probiotic Product (ALIGN) 4 MG CAPS, Take 4 mg by mouth daily., Disp: , Rfl:    Vonoprazan Fumarate (VOQUEZNA) 20 MG TABS, Take by mouth., Disp: , Rfl:    cilostazol (PLETAL) 50 MG tablet, Take 1 tablet (50 mg total) by mouth 2 (two) times daily., Disp: 180 tablet, Rfl: 3  clopidogrel (PLAVIX) 75 MG tablet, Take 1 tablet (75 mg total) by mouth daily., Disp: 90 tablet, Rfl: 3   ranolazine (RANEXA) 500 MG 12 hr tablet, Take 1 tablet (500 mg total) by mouth 2 (two) times daily., Disp: 180 tablet, Rfl: 0  No orders of the defined types were placed in this encounter.   There are no Patient Instructions  on file for this visit.   --Continue cardiac medications as reconciled in final medication list. --Return in about 3 months (around 04/23/2023) for Follow up CAD. or sooner if needed. --Continue follow-up with your primary care physician regarding the management of your other chronic comorbid conditions.  Patient's questions and concerns were addressed to her satisfaction. She voices understanding of the instructions provided during this encounter.   This note was created using a voice recognition software as a result there may be grammatical errors inadvertently enclosed that do not reflect the nature of this encounter. Every attempt is made to correct such errors.  Susan Barnett, [place], [facility]  Pager:  [phone] Office: [phone]

## 2023-01-24 DIAGNOSIS — E1169 Type 2 diabetes mellitus with other specified complication: Secondary | ICD-10-CM | POA: Diagnosis not present

## 2023-01-24 DIAGNOSIS — I5032 Chronic diastolic (congestive) heart failure: Secondary | ICD-10-CM | POA: Diagnosis not present

## 2023-01-24 DIAGNOSIS — E782 Mixed hyperlipidemia: Secondary | ICD-10-CM | POA: Diagnosis not present

## 2023-01-24 DIAGNOSIS — I110 Hypertensive heart disease with heart failure: Secondary | ICD-10-CM | POA: Diagnosis not present

## 2023-01-24 DIAGNOSIS — E039 Hypothyroidism, unspecified: Secondary | ICD-10-CM | POA: Diagnosis not present

## 2023-01-24 DIAGNOSIS — K219 Gastro-esophageal reflux disease without esophagitis: Secondary | ICD-10-CM | POA: Diagnosis not present

## 2023-01-24 DIAGNOSIS — I25110 Atherosclerotic heart disease of native coronary artery with unstable angina pectoris: Secondary | ICD-10-CM | POA: Diagnosis not present

## 2023-01-24 DIAGNOSIS — E876 Hypokalemia: Secondary | ICD-10-CM | POA: Diagnosis not present

## 2023-01-24 DIAGNOSIS — G4733 Obstructive sleep apnea (adult) (pediatric): Secondary | ICD-10-CM | POA: Diagnosis not present

## 2023-01-24 DIAGNOSIS — I700 Atherosclerosis of aorta: Secondary | ICD-10-CM | POA: Diagnosis not present

## 2023-01-24 DIAGNOSIS — D649 Anemia, unspecified: Secondary | ICD-10-CM | POA: Diagnosis not present

## 2023-01-24 DIAGNOSIS — I222 Subsequent non-ST elevation (NSTEMI) myocardial infarction: Secondary | ICD-10-CM | POA: Diagnosis not present

## 2023-01-24 DIAGNOSIS — Z794 Long term (current) use of insulin: Secondary | ICD-10-CM | POA: Diagnosis not present

## 2023-01-24 DIAGNOSIS — I214 Non-ST elevation (NSTEMI) myocardial infarction: Secondary | ICD-10-CM | POA: Diagnosis not present

## 2023-01-24 DIAGNOSIS — F419 Anxiety disorder, unspecified: Secondary | ICD-10-CM | POA: Diagnosis not present

## 2023-01-24 DIAGNOSIS — E1165 Type 2 diabetes mellitus with hyperglycemia: Secondary | ICD-10-CM | POA: Diagnosis not present

## 2023-01-25 ENCOUNTER — Other Ambulatory Visit: Payer: Self-pay | Admitting: Cardiology

## 2023-01-25 DIAGNOSIS — D649 Anemia, unspecified: Secondary | ICD-10-CM | POA: Diagnosis not present

## 2023-01-25 DIAGNOSIS — G4733 Obstructive sleep apnea (adult) (pediatric): Secondary | ICD-10-CM | POA: Diagnosis not present

## 2023-01-25 DIAGNOSIS — I214 Non-ST elevation (NSTEMI) myocardial infarction: Secondary | ICD-10-CM | POA: Diagnosis not present

## 2023-01-25 DIAGNOSIS — I5032 Chronic diastolic (congestive) heart failure: Secondary | ICD-10-CM | POA: Diagnosis not present

## 2023-01-25 DIAGNOSIS — F419 Anxiety disorder, unspecified: Secondary | ICD-10-CM | POA: Diagnosis not present

## 2023-01-25 DIAGNOSIS — K219 Gastro-esophageal reflux disease without esophagitis: Secondary | ICD-10-CM | POA: Diagnosis not present

## 2023-01-25 DIAGNOSIS — I700 Atherosclerosis of aorta: Secondary | ICD-10-CM | POA: Diagnosis not present

## 2023-01-25 DIAGNOSIS — E1165 Type 2 diabetes mellitus with hyperglycemia: Secondary | ICD-10-CM | POA: Diagnosis not present

## 2023-01-25 DIAGNOSIS — I110 Hypertensive heart disease with heart failure: Secondary | ICD-10-CM | POA: Diagnosis not present

## 2023-01-25 DIAGNOSIS — Z794 Long term (current) use of insulin: Secondary | ICD-10-CM | POA: Diagnosis not present

## 2023-01-25 DIAGNOSIS — E039 Hypothyroidism, unspecified: Secondary | ICD-10-CM | POA: Diagnosis not present

## 2023-01-25 DIAGNOSIS — I222 Subsequent non-ST elevation (NSTEMI) myocardial infarction: Secondary | ICD-10-CM | POA: Diagnosis not present

## 2023-01-25 DIAGNOSIS — E876 Hypokalemia: Secondary | ICD-10-CM | POA: Diagnosis not present

## 2023-01-25 DIAGNOSIS — E782 Mixed hyperlipidemia: Secondary | ICD-10-CM | POA: Diagnosis not present

## 2023-01-25 DIAGNOSIS — I25110 Atherosclerotic heart disease of native coronary artery with unstable angina pectoris: Secondary | ICD-10-CM | POA: Diagnosis not present

## 2023-01-25 DIAGNOSIS — E1169 Type 2 diabetes mellitus with other specified complication: Secondary | ICD-10-CM | POA: Diagnosis not present

## 2023-02-04 ENCOUNTER — Telehealth: Payer: Self-pay

## 2023-02-04 NOTE — Telephone Encounter (Signed)
Patient says that you prescribed her Jardiance and she needs a refill but I don't see that it was ordered by you?

## 2023-02-05 NOTE — Telephone Encounter (Signed)
You can refill.   Tessa Lerner, DO, [facility]

## 2023-02-06 ENCOUNTER — Other Ambulatory Visit: Payer: Self-pay | Admitting: Cardiology

## 2023-02-06 MED ORDER — EMPAGLIFLOZIN 10 MG PO TABS: 10.0000 mg | ORAL_TABLET | Freq: Every day | ORAL | 11 refills | Status: DC

## 2023-02-07 ENCOUNTER — Other Ambulatory Visit: Payer: Self-pay

## 2023-03-06 DIAGNOSIS — I119 Hypertensive heart disease without heart failure: Secondary | ICD-10-CM | POA: Diagnosis not present

## 2023-03-06 DIAGNOSIS — E1142 Type 2 diabetes mellitus with diabetic polyneuropathy: Secondary | ICD-10-CM | POA: Diagnosis not present

## 2023-03-06 DIAGNOSIS — N390 Urinary tract infection, site not specified: Secondary | ICD-10-CM | POA: Diagnosis not present

## 2023-03-26 DIAGNOSIS — I25119 Atherosclerotic heart disease of native coronary artery with unspecified angina pectoris: Secondary | ICD-10-CM | POA: Diagnosis not present

## 2023-03-26 DIAGNOSIS — I700 Atherosclerosis of aorta: Secondary | ICD-10-CM | POA: Diagnosis not present

## 2023-03-26 DIAGNOSIS — Z0000 Encounter for general adult medical examination without abnormal findings: Secondary | ICD-10-CM | POA: Diagnosis not present

## 2023-03-26 DIAGNOSIS — I10 Essential (primary) hypertension: Secondary | ICD-10-CM | POA: Diagnosis not present

## 2023-03-26 DIAGNOSIS — E1142 Type 2 diabetes mellitus with diabetic polyneuropathy: Secondary | ICD-10-CM | POA: Diagnosis not present

## 2023-04-01 ENCOUNTER — Other Ambulatory Visit: Payer: Self-pay | Admitting: Cardiology

## 2023-04-01 DIAGNOSIS — I25118 Atherosclerotic heart disease of native coronary artery with other forms of angina pectoris: Secondary | ICD-10-CM

## 2023-04-01 DIAGNOSIS — Z951 Presence of aortocoronary bypass graft: Secondary | ICD-10-CM

## 2023-04-01 DIAGNOSIS — Z955 Presence of coronary angioplasty implant and graft: Secondary | ICD-10-CM

## 2023-04-01 DIAGNOSIS — I214 Non-ST elevation (NSTEMI) myocardial infarction: Secondary | ICD-10-CM

## 2023-04-09 DIAGNOSIS — G4733 Obstructive sleep apnea (adult) (pediatric): Secondary | ICD-10-CM | POA: Diagnosis not present

## 2023-04-23 ENCOUNTER — Ambulatory Visit: Payer: Medicare Other | Admitting: Cardiology

## 2023-04-23 ENCOUNTER — Encounter: Payer: Self-pay | Admitting: Cardiology

## 2023-04-23 VITALS — BP 132/64 | HR 78 | Resp 16 | Ht 61.0 in | Wt 143.0 lb

## 2023-04-23 DIAGNOSIS — I25118 Atherosclerotic heart disease of native coronary artery with other forms of angina pectoris: Secondary | ICD-10-CM | POA: Diagnosis not present

## 2023-04-23 DIAGNOSIS — I10 Essential (primary) hypertension: Secondary | ICD-10-CM | POA: Diagnosis not present

## 2023-04-23 DIAGNOSIS — I214 Non-ST elevation (NSTEMI) myocardial infarction: Secondary | ICD-10-CM | POA: Diagnosis not present

## 2023-04-23 DIAGNOSIS — E118 Type 2 diabetes mellitus with unspecified complications: Secondary | ICD-10-CM

## 2023-04-23 DIAGNOSIS — Z955 Presence of coronary angioplasty implant and graft: Secondary | ICD-10-CM | POA: Diagnosis not present

## 2023-04-23 DIAGNOSIS — E785 Hyperlipidemia, unspecified: Secondary | ICD-10-CM

## 2023-04-23 DIAGNOSIS — G4733 Obstructive sleep apnea (adult) (pediatric): Secondary | ICD-10-CM

## 2023-04-23 DIAGNOSIS — Z951 Presence of aortocoronary bypass graft: Secondary | ICD-10-CM | POA: Diagnosis not present

## 2023-04-23 MED ORDER — LOSARTAN POTASSIUM 100 MG PO TABS: 100.0000 mg | ORAL_TABLET | Freq: Every day | ORAL | 3 refills | Status: DC

## 2023-04-23 MED ORDER — NITROGLYCERIN 0.4 MG SL SUBL: 0.4000 mg | SUBLINGUAL_TABLET | SUBLINGUAL | 1 refills | Status: DC | PRN

## 2023-04-23 MED ORDER — ATORVASTATIN CALCIUM 40 MG PO TABS: 40.0000 mg | ORAL_TABLET | Freq: Every day | ORAL | 3 refills | Status: DC

## 2023-04-23 MED ORDER — CILOSTAZOL 50 MG PO TABS
50.0000 mg | ORAL_TABLET | Freq: Two times a day (BID) | ORAL | 3 refills | Status: DC
Start: 2023-04-23 — End: 2024-04-14

## 2023-04-23 MED ORDER — METOPROLOL TARTRATE 50 MG PO TABS: 50.0000 mg | ORAL_TABLET | Freq: Two times a day (BID) | ORAL | 3 refills | Status: DC

## 2023-04-23 MED ORDER — RANOLAZINE ER 500 MG PO TB12: 500.0000 mg | ORAL_TABLET | Freq: Two times a day (BID) | ORAL | 0 refills | Status: DC

## 2023-04-23 MED ORDER — CLOPIDOGREL BISULFATE 75 MG PO TABS
75.0000 mg | ORAL_TABLET | Freq: Every day | ORAL | 3 refills | Status: AC
Start: 2023-04-23 — End: 2024-04-22

## 2023-04-23 NOTE — Progress Notes (Signed)
ID:  Susan Barnett, DOB 06-13-1946, MRN [id number]  PCP:  Noberto Retort, MD  Cardiologist:  Tessa Lerner, DO, [facility] (established care 11/30/2022) Former Cardiology Providers: Dr. Verdis Prime Sleep medicine provider: Dr. Carolanne Grumbling  Date: 04/23/23 Last Office Visit: 01/21/2023  Chief Complaint  Patient presents with   Coronary Artery Disease   Follow-up    3 month    HPI  Susan Barnett is a 76 y.o. Caucasian female whose past medical history and cardiovascular risk factors include: CAD status post CABG 2015 (SVG to diagonal and SVG to PDA) and s/p PCI to LCX (11/2022), hypertension, diabetes mellitus type 2, hyperlipidemia, OSA  Patient being followed by the practice given her CAD/CABG.  In the past her symptoms were concerning for angina pectoris underwent heart catheterization on 12/04/2022.  During initial catheterization SVG to the diagonal branch was not evaluated due to conduction disease &  patient needed a TVP placement and was being monitored in ICU.  She did not need permanent pacemaker.  And prior to discharge a repeat heart catheterization to reevaluate SVG to diagonal branch was recommended but patient refused. Postdischarge she comes to the office for worsening chest pain.  Due to concerns for unstable angina she underwent catheterization on 12/10/2022 and she underwent PCI to the LCx.  She was later discharged home.  After the coronary intervention she presented to the hospital again for anginal discomfort and ruled in for NSTEMI and underwent repeat heart catheterization on 12/24/2022 which noted patent stents and moderate to severe disease but the vessels were small caliber and no additional interventions were performed.  Patient presents today for 44-month follow-up visit.  Over the last couple weeks patient has noted left arm pain and chest pain approximately at 4 PM and 2 AM on a regular basis.  Patient states that the left arm pain radiates to the chest, she notices  chest heaviness/pressure, substernally located, intensity is 4 out of 10, usually waits for 60 minutes prior to taking sublingual nitroglycerin tablets.  The pain is usually resolved after taking 1 sublingual nitroglycerin tablet.  During these episodes her blood pressures are also elevated, >140 mmHg.  Patient has known stable angina and the frequency of her discomfort is less compared to before.   ALLERGIES: Allergies  Allergen Reactions   Asa [Aspirin] Anaphylaxis   Compazine [Prochlorperazine Edisylate] Swelling and Other (See Comments)    Tongue swelling    Nsaids Anaphylaxis   Shellfish Allergy Hives and Swelling   Sulfa Antibiotics Hives and Swelling   Zantac [Ranitidine Hcl] Hives and Swelling   Lisinopril Cough    MEDICATION LIST PRIOR TO VISIT: Current Meds  Medication Sig   acetaminophen (TYLENOL) 650 MG CR tablet Take 1,300 mg by mouth at bedtime.   amLODipine (NORVASC) 5 MG tablet Take 5 mg by mouth in the morning and at bedtime.   CALCIUM PO Take 1 tablet by mouth in the morning and at bedtime.   Cetirizine HCl (ZYRTEC ALLERGY) 10 MG CAPS Take 1 tablet by mouth daily.   Coenzyme Q10 300 MG CAPS Take 300 mg by mouth 2 (two) times daily.   D-Mannose 500 MG CAPS Take 500 mg by mouth in the morning and at bedtime.   LANTUS SOLOSTAR 100 UNIT/ML Solostar Pen Inject 10 Units into the skin at bedtime. (Patient taking differently: Inject 40 Units into the skin at bedtime.)   levothyroxine (SYNTHROID, LEVOTHROID) 75 MCG tablet Take 75 mcg by mouth daily before breakfast.  Multiple Vitamin (MULTIVITAMIN ADULT PO) Take 1 tablet by mouth daily.   ONETOUCH VERIO test strip 1 each 2 (two) times daily.   Probiotic Product (ALIGN) 4 MG CAPS Take 4 mg by mouth daily.   [DISCONTINUED] atorvastatin (LIPITOR) 40 MG tablet TAKE ONE TABLET BY MOUTH EVERYDAY AT BEDTIME   [DISCONTINUED] cilostazol (PLETAL) 50 MG tablet Take 1 tablet (50 mg total) by mouth 2 (two) times daily.    [DISCONTINUED] clopidogrel (PLAVIX) 75 MG tablet Take 1 tablet (75 mg total) by mouth daily.   [DISCONTINUED] losartan (COZAAR) 100 MG tablet Take 1 tablet (100 mg total) by mouth daily.   [DISCONTINUED] metoprolol tartrate (LOPRESSOR) 50 MG tablet TAKE ONE TABLET BY MOUTH TWICE DAILY   [DISCONTINUED] nitroGLYCERIN (NITROSTAT) 0.4 MG SL tablet Place 1 tablet (0.4 mg total) under the tongue every 5 (five) minutes as needed for chest pain.   [DISCONTINUED] ranolazine (RANEXA) 500 MG 12 hr tablet TAKE ONE TABLET BY MOUTH TWICE DAILY     PAST MEDICAL HISTORY: Past Medical History:  Diagnosis Date   Anemia    mild   Anginal pain (HCC)    with exertion, last time prior to cardiac caht 06/02/14   Anxiety    situational   Arthritis    Breast cancer (HCC) 2018   Left Breast Cancer   CAD (coronary artery disease)    Dr. Mendel Ryder follows, no problems now   Cancer [facility])    Breast cancer   Complication of anesthesia    Diabetes mellitus, type 2 (HCC)    TYPE 2   Dysrhythmia    occ skips a beat, rate was fast before beta blocker.   Gastric polyp    Genetic testing 04/12/2017   Susan Barnett underwent genetic counseling and testing for hereditary cancer syndromes on 02/19/2017. Her results were negative for mutations in all 46 genes analyzed by [facility]'s 46-gene Common Hereditary Cancers Panel. Genes analyzed include: APC, ATM, AXIN2, BARD1, BMPR1A, BRCA1, BRCA2, BRIP1, CDH1, CDKN2A, CHEK2, CTNNA1, DICER1, EPCAM, GREM1, HOXB13, KIT, MEN1, MLH1, MSH2, MSH3, MSH6, MUTYH, NBN   GERD (gastroesophageal reflux disease)    Headache    hx of   Heartburn    Hiatal hernia    mild head tremors   History of radiation therapy 01/30/17- 02/21/17   Left Breast 42.56 Gy in 16 fractions   Hypertension    Hypothyroidism    Occasional tremors    "of head", slight hands   Osteopenia    Personal history of radiation therapy 2018   Left Breast Cancer   Pneumonia    PONV (postoperative nausea and vomiting)  02/24/14   Shortness of breath    with exertion   Sleep apnea    cpap used nightly x 2 yrs now.    PAST SURGICAL HISTORY: Past Surgical History:  Procedure Laterality Date   BREAST BIOPSY Left 1987   BREAST BIOPSY Left 2019   BREAST LUMPECTOMY Left 01/25/2017   BREAST LUMPECTOMY WITH RADIOACTIVE SEED AND SENTINEL LYMPH NODE BIOPSY Left 12/25/2016   Procedure: BREAST LUMPECTOMY WITH RADIOACTIVE SEED AND SENTINEL LYMPH NODE BIOPSY;  Surgeon: Glenna Fellows, MD;  Location: [facility] OR;  Service: General;  Laterality: Left;   CARDIAC CATHETERIZATION  06/02/2014   LAD 30%, D1 80%, CFX 755, RCA > 95% ISR, rx w/ PTCA, heavy calcification, EF 65%   CORONARY ARTERY BYPASS GRAFT N/A 06/14/2014   Procedure: CORONARY ARTERY BYPASS GRAFTING (CABG);  Surgeon: Alleen Borne, MD;  Location: [facility] OR;  Service: Open Heart Surgery;  Laterality: N/A;  Times 2 using endoscopically harvested right saphenous vein.   CORONARY STENT INTERVENTION N/A 12/10/2022   Procedure: CORONARY STENT INTERVENTION;  Surgeon: Elder Negus, MD;  Location: [facility] INVASIVE CV LAB;  Service: Cardiovascular;  Laterality: N/A;   coronary stents     x3 stents placed-prior ablation   CORONARY ULTRASOUND/IVUS N/A 12/10/2022   Procedure: Coronary Ultrasound/IVUS;  Surgeon: Elder Negus, MD;  Location: [facility] INVASIVE CV LAB;  Service: Cardiovascular;  Laterality: N/A;   DILATION AND CURETTAGE OF UTERUS     ELBOW FRACTURE SURGERY Left    INCONTINENCE SURGERY     sling   KNEE ARTHROSCOPY Right 02/24/2014   Procedure: RIGHT KNEE ARTHROSCOPY WITH DEBRIDEMENT ;  Surgeon: Loanne Drilling, MD;  Location: [facility] ORS;  Service: Orthopedics;  Laterality: Right;   LEFT HEART CATH AND CORS/GRAFTS ANGIOGRAPHY N/A 12/03/2022   Procedure: LEFT HEART CATH AND CORS/GRAFTS ANGIOGRAPHY;  Surgeon: Elder Negus, MD;  Location: [facility] INVASIVE CV LAB;  Service: Cardiovascular;  Laterality: N/A;   LEFT HEART CATH AND CORS/GRAFTS ANGIOGRAPHY N/A 12/10/2022    Procedure: LEFT HEART CATH AND CORS/GRAFTS ANGIOGRAPHY;  Surgeon: Elder Negus, MD;  Location: [facility] INVASIVE CV LAB;  Service: Cardiovascular;  Laterality: N/A;   LEFT HEART CATH AND CORS/GRAFTS ANGIOGRAPHY N/A 12/24/2022   Procedure: LEFT HEART CATH AND CORS/GRAFTS ANGIOGRAPHY;  Surgeon: Elder Negus, MD;  Location: [facility] INVASIVE CV LAB;  Service: Cardiovascular;  Laterality: N/A;   LEFT HEART CATHETERIZATION WITH CORONARY ANGIOGRAM N/A 06/02/2014   Procedure: LEFT HEART CATHETERIZATION WITH CORONARY ANGIOGRAM;  Surgeon: Lesleigh Noe, MD;  Location: [facility] CATH LAB;  Service: Cardiovascular;  Laterality: N/A;   TEE WITHOUT CARDIOVERSION N/A 06/14/2014   Procedure: TRANSESOPHAGEAL ECHOCARDIOGRAM (TEE);  Surgeon: Alleen Borne, MD;  Location[facility] OR;  Service: Open Heart Surgery;  Laterality: N/A;   TEMPORARY PACEMAKER N/A 12/03/2022   Procedure: TEMPORARY PACEMAKER;  Surgeon: Elder Negus, MD;  Location: [facility] INVASIVE CV LAB;  Service: Cardiovascular;  Laterality: N/A;    FAMILY HISTORY: The patient family history includes Breast cancer (age of onset: 74) in her cousin; Diabetes in her mother; Healthy in her brother; Heart attack in her mother; Hodgkin's lymphoma in her maternal uncle; Hypertension in her mother; Lung cancer in her brother; Ulcerative colitis in her daughter.  SOCIAL HISTORY:  The patient  reports that she has never smoked. She has never used smokeless tobacco. She reports that she does not drink alcohol and does not use drugs.  REVIEW OF SYSTEMS: Review of Systems  Cardiovascular:  Positive for chest pain. Negative for claudication, dyspnea on exertion, irregular heartbeat, leg swelling, near-syncope, orthopnea, palpitations, paroxysmal nocturnal dyspnea and syncope.  Respiratory:  Negative for shortness of breath.   Hematologic/Lymphatic: Negative for bleeding problem.  Musculoskeletal:  Negative for muscle cramps and myalgias.  Gastrointestinal:  Positive  for heartburn.  Neurological:  Negative for dizziness and light-headedness.    PHYSICAL EXAM:    04/23/2023   11:22 AM 01/21/2023    3:03 PM 01/21/2023    2:58 PM  Vitals with BMI  Height 5\' 1"   5\' 1"   Weight 143 lbs  148 lbs 3 oz  BMI 27.03  28.02  Systolic 132 149 073  Diastolic 64 78 74  Pulse 78 84 85    Physical Exam  Constitutional: No distress.  Age appropriate, hemodynamically stable, walks with a cane.  Neck: No JVD present.  Cardiovascular: Normal rate,  regular rhythm, S1 normal, S2 normal, intact distal pulses and normal pulses. Exam reveals no gallop, no S3 and no S4.  No murmur heard. Pulmonary/Chest: Effort normal and breath sounds normal. No stridor. She has no wheezes. She has no rales.  Abdominal: Soft. Bowel sounds are normal. She exhibits no distension. There is no abdominal tenderness.  Musculoskeletal:        General: No edema.     Cervical back: Neck supple.  Neurological: She is alert and oriented to person, place, and time. She has intact cranial nerves (2-12).  Skin: Skin is warm and moist.   CARDIAC DATABASE: EKG: 04/23/2023: Sinus rhythm, 75 bpm, without underlying injury pattern.  Echocardiogram: 09/01/2018: LVEF 55 to 60%, grade 2 diastolic dysfunction, trivial AR, mild MR, mild left atrial enlargement.  December 03, 2022:  1. Left ventricular ejection fraction, by estimation, is 60 to 65%. The  left ventricle has normal function. There is mild concentric left  ventricular hypertrophy. Left ventricular diastolic parameters are  consistent with Grade II diastolic dysfunction  (pseudonormalization). On the 2-chamber contrast images there appears to  be a small segment of akinesis in the basilar to mid inferior wall. This  is not seen in any other images.   2. Right ventricular systolic function is normal. The right ventricular  size is normal. There is normal pulmonary artery systolic pressure.   3. Left atrial size was mildly dilated.   4. The  mitral valve is normal in structure. Mild mitral valve  regurgitation. No evidence of mitral stenosis.   5. The aortic valve is tricuspid. There is mild calcification of the  aortic valve. Aortic valve regurgitation is trivial. No aortic stenosis is  present.   6. The inferior vena cava is normal in size with <50% respiratory  variability, suggesting right atrial pressure of 8 mmHg.   Stress Testing: MPI 05/18/2020  Nuclear stress EF: 72%. No wall motion abnormalities.  There was no ST segment deviation noted during stress.  The study is normal.  This is a low risk study. No evidence of ischemia or infarction. Prior CABG.  Heart Catheterization: December 03, 2022: LM: Normal LAD: Prox-mid 30% diffuse disease, distal 75% diffuse disease Lcx: Mid 75% diffuse disease RCA: Prox 80% diffuse disease, followed 100 mid ISR SVG-PDA: Patent SVG-diag: Could not be visualized   Temporary transvenous pacemaker placement.   Patient was very confused and kept attempting to sit up.  I did not feel leaving femoral sheath in place was safe.  Therefore, I decided to go ahead and place 5 [place] Mynx closure.   With transient hypotension, patient was very confused and disoriented.  Code stroke was called.  Patient was evaluated by neurology and thought to be less likely to have had stroke.  CT head was ordered out of abundance caution.  Patient is mental alertness improved with time.  She will be kept into heart ICU overnight for monitoring.  Temporary pacemaker will be removed tomorrow morning if no further use is needed.   Unusually prolonged procedure time due to intraprocedural hypotension, complete AV block requiring placement of temporary transvenous placement, disorientation requiring further neurological evaluation.  December 10, 2022: LM: Mild disease LAD: Prox-mid 30% diffuse disease, distal 75% diffuse disease Lcx: Prox 30%, mid 75% diffuse disease, followed by focal 90% stenosis RCA: Not  engaged today. Known Prox 80% diffuse disease, followed 100 mid ISR SVG-PDA: Not engaged today. Known patent SVG-diag: Patent. No significant disease   LVEDP 17 mmHg  Successful percutaneous coronary intervention mid LCx        PTCA and stent placement with overlapping stents        2.0 X 18 mm and 2.25 X 15 Onyx drug-eluting stent        Post dilatation with 2.25 mm stent balloon and 2.5X15 mm Rush balloon up to 16 atm   12/24/2022:   The left ventricular ejection fraction is 55-65% by visual estimate.   LM: Normal LAD: Prox-mid 30% disease      Severe moderate disease in small caliber mid to distal LAD LCx: Prox 30% disease      Patent mid LCx stents RCA: Prox 80% disease, mid occlusion SVG-PDA: Patent. Slow retrograde flow into PLA through lesion in ostial PDA. FLow in PLA is somewhat better than 12/10/2022 SVG-Diag: PAtent with diffuse moderate disease, uncahnged from 12/03/2022   LVEDP and LVEF normal   Multiple areas of small caliber vessel moderate to severe disease, none worse with 12/03/2022 and 12/10/2022 caths. No specific culprit vessel identified that is amenable to any percutaneous or surgical intervention. Recommend continued medical management.    Recommend plavix and pletal 50 mg bid for antiplatelet regimen. Recommend adding Ranexa 500 mg bid for small vessel disease.  Cardiac monitor [facility] Patch): December 06, 2022 -Dec 20, 2022 Dominant rhythm sinus with first-degree AV block. Heart rate 55-179 bpm. Avg HR 75 bpm. No atrial fibrillation, high grade AV block, pauses (3 seconds or longer). Total ventricular ectopic burden <1%. One asymptomatic episode of NSVT (5 beats, 2.6 seconds, max HR 179 bpm).  Total supraventricular ectopic burden <1%. Rare brief episodes of PSVT Patient triggered events: 9. Underlying rhythm sinus with rare PVCs.   LABORATORY DATA:    Latest Ref Rng & Units 12/25/2022   11:06 AM 12/23/2022    5:18 AM 12/11/2022    2:25 AM  CBC  WBC 4.0 -  10.5 K/uL 10.4  13.3  14.2   Hemoglobin 12.0 - 15.0 g/dL 16.1  09.6  04.5   Hematocrit 36.0 - 46.0 % 38.5  39.8  36.0   Platelets 150 - 400 K/uL 356  427  391        Latest Ref Rng & Units 12/25/2022   11:06 AM 12/23/2022    5:18 AM 12/11/2022    2:25 AM  CMP  Glucose 70 - 99 mg/dL 409  811  914   BUN 8 - 23 mg/dL 18  16  10    Creatinine 0.44 - 1.00 mg/dL 7.82  9.56  2.13   Sodium 135 - 145 mmol/L 132  134  134   Potassium 3.5 - 5.1 mmol/L 4.9  3.7  2.8   Chloride 98 - 111 mmol/L 103  100  101   CO2 22 - 32 mmol/L 19  22  22    Calcium 8.9 - 10.3 mg/dL 8.7  9.8  8.7     Lipid Panel     Component Value Date/Time   CHOL 114 12/05/2022 1057   TRIG 149 12/05/2022 1057   HDL 39 (L) 12/05/2022 1057   CHOLHDL 2.9 12/05/2022 1057   VLDL 30 12/05/2022 1057   LDLCALC 45 12/05/2022 1057   LDLDIRECT 51 12/05/2022 1057    No components found for: "NTPROBNP" Recent Labs    11/30/22 1154  PROBNP 793*   Recent Labs    12/03/22 0540  TSH 0.381    BMP Recent Labs    12/11/22 0225 12/23/22 0518 12/25/22 1106  NA 134* 134*  132*  K 2.8* 3.7 4.9  CL 101 100 103  CO2 22 22 19*  GLUCOSE 102* 250* 161*  BUN 10 16 18   CREATININE 0.65 1.02* 0.86  CALCIUM 8.7* 9.8 8.7*  GFRNONAA >60 57* >60    HEMOGLOBIN A1C Lab Results  Component Value Date   HGBA1C 8.7 (H) 12/03/2022   MPG 202.99 12/03/2022   External Labs: Collected: 07/26/2022 Hemoglobin A1c 7.6.  Collected: 03/27/2022 Sodium 137, potassium 4.8, chloride 104, bicarb 27. AST 17, ALT 16, alkaline phosphatase 55. BUN 11, creatinine 0.8. Hemoglobin 13.8, hematocrit 39.2%. Total cholesterol 135, triglycerides 160, HDL 38, LDL 65, non-HDL 97. TSH 0.17  IMPRESSION:    ICD-10-CM   1. Coronary artery disease of native artery of native heart with stable angina pectoris (HCC)  I25.118 clopidogrel (PLAVIX) 75 MG tablet    cilostazol (PLETAL) 50 MG tablet    ranolazine (RANEXA) 500 MG 12 hr tablet    nitroGLYCERIN  (NITROSTAT) 0.4 MG SL tablet    metoprolol tartrate (LOPRESSOR) 50 MG tablet    atorvastatin (LIPITOR) 40 MG tablet    EKG 12-Lead    2. Hx of CABG  Z95.1 clopidogrel (PLAVIX) 75 MG tablet    cilostazol (PLETAL) 50 MG tablet    ranolazine (RANEXA) 500 MG 12 hr tablet    nitroGLYCERIN (NITROSTAT) 0.4 MG SL tablet    metoprolol tartrate (LOPRESSOR) 50 MG tablet    atorvastatin (LIPITOR) 40 MG tablet    3. S/P drug eluting coronary stent placement  Z95.5 clopidogrel (PLAVIX) 75 MG tablet    cilostazol (PLETAL) 50 MG tablet    ranolazine (RANEXA) 500 MG 12 hr tablet    nitroGLYCERIN (NITROSTAT) 0.4 MG SL tablet    metoprolol tartrate (LOPRESSOR) 50 MG tablet    atorvastatin (LIPITOR) 40 MG tablet    4. NSTEMI (non-ST elevated myocardial infarction) (HCC)  I21.4 clopidogrel (PLAVIX) 75 MG tablet    cilostazol (PLETAL) 50 MG tablet    ranolazine (RANEXA) 500 MG 12 hr tablet    nitroGLYCERIN (NITROSTAT) 0.4 MG SL tablet    metoprolol tartrate (LOPRESSOR) 50 MG tablet    atorvastatin (LIPITOR) 40 MG tablet    5. Essential hypertension  I10 losartan (COZAAR) 100 MG tablet    6. Type 2 diabetes mellitus with complications (HCC)  E11.8     7. Hyperlipidemia LDL goal <70  E78.5     8. OSA (obstructive sleep apnea)  G47.33         RECOMMENDATIONS: EMANUELA TACKMAN is a 77 y.o. Caucasian female whose past medical history and cardiac risk factors include: CAD status post CABG 2015 (SVG to diagonal and SVG to PDA) and s/p PCI to LCX (11/2022), hypertension, diabetes mellitus type 2, hyperlipidemia, OSA  Coronary artery disease involving native coronary artery of native heart with stable angina pectoris Hx of CABG S/P drug eluting coronary stent placement NSTEMI (non-ST elevated myocardial infarction) (HCC) Her symptoms are consistent with her stable angina EKG is nonischemic. Has been compliant with current medical therapy. Has had numerous heart catheterizations since April  2024. Status post PCI to the mid LCx/20 04/2023. Allergic to aspirin. Brilinta was changed to Plavix and cilostazol (tolerating it well) In the past episodes of anginal chest pain has been associated with elevated blood pressures. Will increase amlodipine to 5 mg p.o. twice daily for both antianginal properties and blood pressure management Medications refilled. Reemphasized importance of blood pressure, glycemia, and lipid management Reemphasized importance of using CPAP on a regular  basis  Essential hypertension Office blood pressures are well-controlled. No longer having episodes of hypotension. Increase amlodipine as discussed above. Uses hydrochlorothiazide on a as needed basis -has not needed them much.  Type 2 diabetes mellitus with complications (HCC) Follows with Dr. Evlyn Kanner. Patient states that sugars are better controlled. Currently on ARB, statin therapy. Patient used to be on Jardiance-will discuss at the next visit.  Hyperlipidemia LDL goal <55 Currently on atorvastatin.   She denies myalgia or other side effects. Most recent lipids dated April 2024, independently reviewed as noted above. LDL currently at goal   FINAL MEDICATION LIST END OF ENCOUNTER: Meds ordered this encounter  Medications   clopidogrel (PLAVIX) 75 MG tablet    Sig: Take 1 tablet (75 mg total) by mouth daily.    Dispense:  90 tablet    Refill:  3   cilostazol (PLETAL) 50 MG tablet    Sig: Take 1 tablet (50 mg total) by mouth 2 (two) times daily.    Dispense:  180 tablet    Refill:  3   ranolazine (RANEXA) 500 MG 12 hr tablet    Sig: Take 1 tablet (500 mg total) by mouth 2 (two) times daily.    Dispense:  180 tablet    Refill:  0   nitroGLYCERIN (NITROSTAT) 0.4 MG SL tablet    Sig: Place 1 tablet (0.4 mg total) under the tongue every 5 (five) minutes as needed for chest pain.    Dispense:  25 tablet    Refill:  1    Please call our office to schedule an yearly appointment with Dr. Katrinka Blazing for  May 2023 before anymore refills. [phone]. Thank you 1st attempt   metoprolol tartrate (LOPRESSOR) 50 MG tablet    Sig: Take 1 tablet (50 mg total) by mouth 2 (two) times daily.    Dispense:  60 tablet    Refill:  3   losartan (COZAAR) 100 MG tablet    Sig: Take 1 tablet (100 mg total) by mouth daily.    Dispense:  90 tablet    Refill:  3   atorvastatin (LIPITOR) 40 MG tablet    Sig: Take 1 tablet (40 mg total) by mouth at bedtime.    Dispense:  30 tablet    Refill:  3    Medications Discontinued During This Encounter  Medication Reason   hydrochlorothiazide (MICROZIDE) 12.5 MG capsule    Vonoprazan Fumarate (VOQUEZNA) 20 MG TABS    losartan (COZAAR) 100 MG tablet Reorder   nitroGLYCERIN (NITROSTAT) 0.4 MG SL tablet Reorder   clopidogrel (PLAVIX) 75 MG tablet Reorder   cilostazol (PLETAL) 50 MG tablet Reorder   metoprolol tartrate (LOPRESSOR) 50 MG tablet Reorder   atorvastatin (LIPITOR) 40 MG tablet Reorder   ranolazine (RANEXA) 500 MG 12 hr tablet Reorder     Current Outpatient Medications:    acetaminophen (TYLENOL) 650 MG CR tablet, Take 1,300 mg by mouth at bedtime., Disp: , Rfl:    amLODipine (NORVASC) 5 MG tablet, Take 5 mg by mouth in the morning and at bedtime., Disp: , Rfl:    CALCIUM PO, Take 1 tablet by mouth in the morning and at bedtime., Disp: , Rfl:    Cetirizine HCl (ZYRTEC ALLERGY) 10 MG CAPS, Take 1 tablet by mouth daily., Disp: , Rfl:    Coenzyme Q10 300 MG CAPS, Take 300 mg by mouth 2 (two) times daily., Disp: , Rfl:    D-Mannose 500 MG CAPS, Take 500 mg by mouth  in the morning and at bedtime., Disp: , Rfl:    LANTUS SOLOSTAR 100 UNIT/ML Solostar Pen, Inject 10 Units into the skin at bedtime. (Patient taking differently: Inject 40 Units into the skin at bedtime.), Disp: 15 mL, Rfl: 0   levothyroxine (SYNTHROID, LEVOTHROID) 75 MCG tablet, Take 75 mcg by mouth daily before breakfast. , Disp: , Rfl:    Multiple Vitamin (MULTIVITAMIN ADULT PO), Take 1 tablet  by mouth daily., Disp: , Rfl:    ONETOUCH VERIO test strip, 1 each 2 (two) times daily., Disp: , Rfl:    Probiotic Product (ALIGN) 4 MG CAPS, Take 4 mg by mouth daily., Disp: , Rfl:    atorvastatin (LIPITOR) 40 MG tablet, Take 1 tablet (40 mg total) by mouth at bedtime., Disp: 30 tablet, Rfl: 3   cilostazol (PLETAL) 50 MG tablet, Take 1 tablet (50 mg total) by mouth 2 (two) times daily., Disp: 180 tablet, Rfl: 3   clopidogrel (PLAVIX) 75 MG tablet, Take 1 tablet (75 mg total) by mouth daily., Disp: 90 tablet, Rfl: 3   hydrochlorothiazide (HYDRODIURIL) 12.5 MG tablet, Take 12.5 mg by mouth daily as needed (Take if SBP>133mmHG.). (Patient not taking: Reported on 04/23/2023), Disp: , Rfl:    losartan (COZAAR) 100 MG tablet, Take 1 tablet (100 mg total) by mouth daily., Disp: 90 tablet, Rfl: 3   metoprolol tartrate (LOPRESSOR) 50 MG tablet, Take 1 tablet (50 mg total) by mouth 2 (two) times daily., Disp: 60 tablet, Rfl: 3   nitroGLYCERIN (NITROSTAT) 0.4 MG SL tablet, Place 1 tablet (0.4 mg total) under the tongue every 5 (five) minutes as needed for chest pain., Disp: 25 tablet, Rfl: 1   ranolazine (RANEXA) 500 MG 12 hr tablet, Take 1 tablet (500 mg total) by mouth 2 (two) times daily., Disp: 180 tablet, Rfl: 0  Orders Placed This Encounter  Procedures   EKG 12-Lead    There are no Patient Instructions on file for this visit.   --Continue cardiac medications as reconciled in final medication list. --Return in about 3 months (around 07/23/2023) for CAD. or sooner if needed. --Continue follow-up with your primary care physician regarding the management of your other chronic comorbid conditions.  Patient's questions and concerns were addressed to her satisfaction. She voices understanding of the instructions provided during this encounter.   This note was created using a voice recognition software as a result there may be grammatical errors inadvertently enclosed that do not reflect the nature of  this encounter. Every attempt is made to correct such errors.  Tessa Lerner, [place], [facility]  Pager:  [phone] Office: [phone]

## 2023-04-24 DIAGNOSIS — G4733 Obstructive sleep apnea (adult) (pediatric): Secondary | ICD-10-CM | POA: Diagnosis not present

## 2023-04-30 DIAGNOSIS — G4733 Obstructive sleep apnea (adult) (pediatric): Secondary | ICD-10-CM | POA: Diagnosis not present

## 2023-05-13 ENCOUNTER — Other Ambulatory Visit: Payer: Self-pay

## 2023-05-13 NOTE — Telephone Encounter (Signed)
Pt's pharmacy is requesting a refill on amlodipine. This medication has not been refilled since 2021. Would Dr. Odis Hollingshead like to refill this medication? Please address

## 2023-05-14 MED ORDER — AMLODIPINE BESYLATE 5 MG PO TABS: 5.0000 mg | ORAL_TABLET | Freq: Two times a day (BID) | ORAL | 3 refills | Status: DC

## 2023-05-15 ENCOUNTER — Telehealth: Payer: Self-pay | Admitting: Cardiology

## 2023-05-15 MED ORDER — AMLODIPINE BESYLATE 5 MG PO TABS: 5.0000 mg | ORAL_TABLET | Freq: Two times a day (BID) | ORAL | 3 refills | Status: DC

## 2023-05-15 NOTE — Telephone Encounter (Signed)
Pt's medication was sent to pt's pharmacy as requested. Confirmation received.  °

## 2023-05-15 NOTE — Telephone Encounter (Signed)
*  STAT* If patient is at the pharmacy, call can be transferred to refill team.   1. Which medications need to be refilled? (please list name of each medication and dose if known) amLODipine (NORVASC) 5 MG tablet  Take 1 tablet (5 mg total) by mouth in the morning and at bedtime.   2. Would you like to learn more about the convenience, safety, & potential cost savings by using the [facility]? No   3. Are you open to using the [facility] No   4. Which pharmacy/location (including street and city if local pharmacy) is medication to be sent to? [facility] [address] [place], [place], [place] [address]   5. Do they need a 30 day or 90 day supply? 90 Day Supply

## 2023-05-28 ENCOUNTER — Encounter: Payer: Self-pay | Admitting: Cardiology

## 2023-05-28 NOTE — Telephone Encounter (Signed)
Error

## 2023-06-04 DIAGNOSIS — Z23 Encounter for immunization: Secondary | ICD-10-CM | POA: Diagnosis not present

## 2023-06-12 DIAGNOSIS — S39012A Strain of muscle, fascia and tendon of lower back, initial encounter: Secondary | ICD-10-CM | POA: Diagnosis not present

## 2023-06-24 DIAGNOSIS — I119 Hypertensive heart disease without heart failure: Secondary | ICD-10-CM | POA: Diagnosis not present

## 2023-06-24 DIAGNOSIS — E1142 Type 2 diabetes mellitus with diabetic polyneuropathy: Secondary | ICD-10-CM | POA: Diagnosis not present

## 2023-06-24 DIAGNOSIS — E039 Hypothyroidism, unspecified: Secondary | ICD-10-CM | POA: Diagnosis not present

## 2023-07-15 DIAGNOSIS — M79672 Pain in left foot: Secondary | ICD-10-CM | POA: Diagnosis not present

## 2023-07-15 DIAGNOSIS — M25572 Pain in left ankle and joints of left foot: Secondary | ICD-10-CM | POA: Diagnosis not present

## 2023-07-15 DIAGNOSIS — S92355A Nondisplaced fracture of fifth metatarsal bone, left foot, initial encounter for closed fracture: Secondary | ICD-10-CM | POA: Diagnosis not present

## 2023-07-30 ENCOUNTER — Encounter: Payer: Self-pay | Admitting: Cardiology

## 2023-07-30 ENCOUNTER — Ambulatory Visit: Payer: Medicare Other | Attending: Cardiology | Admitting: Cardiology

## 2023-07-30 VITALS — BP 132/62 | HR 68 | Resp 16 | Ht 61.0 in | Wt 147.0 lb

## 2023-07-30 DIAGNOSIS — Z955 Presence of coronary angioplasty implant and graft: Secondary | ICD-10-CM

## 2023-07-30 DIAGNOSIS — I10 Essential (primary) hypertension: Secondary | ICD-10-CM

## 2023-07-30 DIAGNOSIS — Z951 Presence of aortocoronary bypass graft: Secondary | ICD-10-CM | POA: Diagnosis not present

## 2023-07-30 DIAGNOSIS — I2510 Atherosclerotic heart disease of native coronary artery without angina pectoris: Secondary | ICD-10-CM | POA: Diagnosis not present

## 2023-07-30 DIAGNOSIS — I214 Non-ST elevation (NSTEMI) myocardial infarction: Secondary | ICD-10-CM

## 2023-07-30 DIAGNOSIS — S92355A Nondisplaced fracture of fifth metatarsal bone, left foot, initial encounter for closed fracture: Secondary | ICD-10-CM | POA: Diagnosis not present

## 2023-07-30 DIAGNOSIS — E118 Type 2 diabetes mellitus with unspecified complications: Secondary | ICD-10-CM

## 2023-07-30 DIAGNOSIS — E785 Hyperlipidemia, unspecified: Secondary | ICD-10-CM

## 2023-07-30 DIAGNOSIS — G4733 Obstructive sleep apnea (adult) (pediatric): Secondary | ICD-10-CM

## 2023-07-30 NOTE — Progress Notes (Signed)
Cardiology Office Note:  .   Date:  07/30/2023  ID:  Susan Barnett, DOB 1945/10/15, MRN [id number] PCP:  Noberto Retort, MD  Former Cardiology Providers: None [facility] Providers Cardiologist:  Tessa Lerner, DO, [facility] (established care April 2024) Sleep Medicine:  Armanda Magic, MD  Electrophysiologist:  None  Click to update primary MD,subspecialty MD or APP then REFRESH:1}    Chief Complaint  Patient presents with   Coronary artery disease of native artery of native heart wi   Follow-up    History of Present Illness: .   Susan Barnett is a 77 y.o. Caucasian female whose past medical history and cardiovascular risk factors includes: CAD status post CABG 2015 (SVG to diagonal and SVG to PDA) and s/p PCI to LCX (11/2022), hypertension, diabetes mellitus type 2, hyperlipidemia, OSA .  Patient is noted to have coronary artery disease and has had multiple angiography in the recent past.  During her recent heart catheterization in May 2024 she was noted to have patent stents with moderate to severe disease but the vessels are small in caliber and no additional interventions were performed.  Since then she is been managed medically.  She presents today for follow-up.  Since last office visit patient denies any anginal chest pain or heart failure symptoms.  She has not required sublingual nitroglycerin tablets in the recent past.  Patient states that by taking the amlodipine and metoprolol twice a day her symptoms of chest pain and hypotension are very well-controlled.  Unfortunately, she had a mechanical fall resulting in an injury requiring her to be in a left boot.  She uses hydrochlorothiazide on appearing basis.    Review of Systems: .   Review of Systems  Cardiovascular:  Negative for chest pain, claudication, irregular heartbeat, leg swelling, near-syncope, orthopnea, palpitations, paroxysmal nocturnal dyspnea and syncope.  Respiratory:  Negative for shortness of breath.    Hematologic/Lymphatic: Negative for bleeding problem.    Studies Reviewed:   EKG: 04/23/2023: Sinus rhythm, 75 bpm, without underlying injury pattern.   Echocardiogram: 09/01/2018: LVEF 55 to 60%, grade 2 diastolic dysfunction, trivial AR, mild MR, mild left atrial enlargement.   December 03, 2022:  1. Left ventricular ejection fraction, by estimation, is 60 to 65%. The  left ventricle has normal function. There is mild concentric left  ventricular hypertrophy. Left ventricular diastolic parameters are  consistent with Grade II diastolic dysfunction  (pseudonormalization). On the 2-chamber contrast images there appears to  be a small segment of akinesis in the basilar to mid inferior wall. This  is not seen in any other images.   2. Right ventricular systolic function is normal. The right ventricular  size is normal. There is normal pulmonary artery systolic pressure.   3. Left atrial size was mildly dilated.   4. The mitral valve is normal in structure. Mild mitral valve  regurgitation. No evidence of mitral stenosis.   5. The aortic valve is tricuspid. There is mild calcification of the  aortic valve. Aortic valve regurgitation is trivial. No aortic stenosis is  present.   6. The inferior vena cava is normal in size with <50% respiratory  variability, suggesting right atrial pressure of 8 mmHg.    Stress Testing: MPI 05/18/2020 Nuclear stress EF: 72%. No wall motion abnormalities. There was no ST segment deviation noted during stress. The study is normal. This is a low risk study. No evidence of ischemia or infarction. Prior CABG.   Heart Catheterization: December 03, 2022:  LM: Normal LAD: Prox-mid 30% diffuse disease, distal 75% diffuse disease Lcx: Mid 75% diffuse disease RCA: Prox 80% diffuse disease, followed 100 mid ISR SVG-PDA: Patent SVG-diag: Could not be visualized   Temporary transvenous pacemaker placement.   Patient was very confused and kept attempting to  sit up.  I did not feel leaving femoral sheath in place was safe.  Therefore, I decided to go ahead and place 5 [place] Mynx closure.   With transient hypotension, patient was very confused and disoriented.  Code stroke was called.  Patient was evaluated by neurology and thought to be less likely to have had stroke.  CT head was ordered out of abundance caution.  Patient is mental alertness improved with time.  She will be kept into heart ICU overnight for monitoring.  Temporary pacemaker will be removed tomorrow morning if no further use is needed.   Unusually prolonged procedure time due to intraprocedural hypotension, complete AV block requiring placement of temporary transvenous placement, disorientation requiring further neurological evaluation.   December 10, 2022: LM: Mild disease LAD: Prox-mid 30% diffuse disease, distal 75% diffuse disease Lcx: Prox 30%, mid 75% diffuse disease, followed by focal 90% stenosis RCA: Not engaged today. Known Prox 80% diffuse disease, followed 100 mid ISR SVG-PDA: Not engaged today. Known patent SVG-diag: Patent. No significant disease   LVEDP 17 mmHg   Successful percutaneous coronary intervention mid LCx        PTCA and stent placement with overlapping stents        2.0 X 18 mm and 2.25 X 15 Onyx drug-eluting stent        Post dilatation with 2.25 mm stent balloon and 2.5X15 mm [place] balloon up to 16 atm    12/24/2022:   The left ventricular ejection fraction is 55-65% by visual estimate.   LM: Normal LAD: Prox-mid 30% disease      Severe moderate disease in small caliber mid to distal LAD LCx: Prox 30% disease      Patent mid LCx stents RCA: Prox 80% disease, mid occlusion SVG-PDA: Patent. Slow retrograde flow into PLA through lesion in ostial PDA. FLow in PLA is somewhat better than 12/10/2022 SVG-Diag: PAtent with diffuse moderate disease, uncahnged from 12/03/2022   LVEDP and LVEF normal   Multiple areas of small caliber vessel moderate to  severe disease, none worse with 12/03/2022 and 12/10/2022 caths. No specific culprit vessel identified that is amenable to any percutaneous or surgical intervention. Recommend continued medical management.    Recommend plavix and pletal 50 mg bid for antiplatelet regimen. Recommend adding Ranexa 500 mg bid for small vessel disease.   Cardiac monitor [facility] Patch): December 06, 2022 -Dec 20, 2022 Dominant rhythm sinus with first-degree AV block. Heart rate 55-179 bpm. Avg HR 75 bpm. No atrial fibrillation, high grade AV block, pauses (3 seconds or longer). Total ventricular ectopic burden <1%. One asymptomatic episode of NSVT (5 beats, 2.6 seconds, max HR 179 bpm).  Total supraventricular ectopic burden <1%. Rare brief episodes of PSVT Patient triggered events: 9. Underlying rhythm sinus with rare PVCs.   RADIOLOGY: NA  Risk Assessment/Calculations:   NA   Labs:       Latest Ref Rng & Units 12/25/2022   11:06 AM 12/23/2022    5:18 AM 12/11/2022    2:25 AM  CBC  WBC 4.0 - 10.5 K/uL 10.4  13.3  14.2   Hemoglobin 12.0 - 15.0 g/dL 16.1  09.6  04.5   Hematocrit 36.0 - 46.0 %  38.5  39.8  36.0   Platelets 150 - 400 K/uL 356  427  391        Latest Ref Rng & Units 12/25/2022   11:06 AM 12/23/2022    5:18 AM 12/11/2022    2:25 AM  BMP  Glucose 70 - 99 mg/dL 440  347  425   BUN 8 - 23 mg/dL 18  16  10    Creatinine 0.44 - 1.00 mg/dL 9.56  3.87  5.64   Sodium 135 - 145 mmol/L 132  134  134   Potassium 3.5 - 5.1 mmol/L 4.9  3.7  2.8   Chloride 98 - 111 mmol/L 103  100  101   CO2 22 - 32 mmol/L 19  22  22    Calcium 8.9 - 10.3 mg/dL 8.7  9.8  8.7       Latest Ref Rng & Units 12/25/2022   11:06 AM 12/23/2022    5:18 AM 12/11/2022    2:25 AM  CMP  Glucose 70 - 99 mg/dL 332  951  884   BUN 8 - 23 mg/dL 18  16  10    Creatinine 0.44 - 1.00 mg/dL 1.66  0.63  0.16   Sodium 135 - 145 mmol/L 132  134  134   Potassium 3.5 - 5.1 mmol/L 4.9  3.7  2.8   Chloride 98 - 111 mmol/L 103  100  101   CO2  22 - 32 mmol/L 19  22  22    Calcium 8.9 - 10.3 mg/dL 8.7  9.8  8.7     Lab Results  Component Value Date   CHOL 114 12/05/2022   HDL 39 (L) 12/05/2022   LDLCALC 45 12/05/2022   LDLDIRECT 51 12/05/2022   TRIG 149 12/05/2022   CHOLHDL 2.9 12/05/2022   Recent Labs    12/04/22 0402 12/05/22 1057  LIPOA 165.6* 162.3*   No components found for: "NTPROBNP" Recent Labs    11/30/22 1154  PROBNP 793*   Recent Labs    12/03/22 0540  TSH 0.381    Physical Exam:    Today's Vitals   07/30/23 1125  BP: 132/62  Pulse: 68  Resp: 16  SpO2: 95%  Weight: 147 lb (66.7 kg)  Height: 5\' 1"  (1.549 m)   Body mass index is 27.78 kg/m. Wt Readings from Last 3 Encounters:  07/30/23 147 lb (66.7 kg)  04/23/23 143 lb (64.9 kg)  01/21/23 148 lb 3.2 oz (67.2 kg)    Physical Exam  Constitutional: No distress.  Age appropriate, hemodynamically stable, walks with a cane.  Neck: No JVD present.  Cardiovascular: Normal rate, regular rhythm, S1 normal, S2 normal, intact distal pulses and normal pulses. Exam reveals no gallop, no S3 and no S4.  No murmur heard. Pulmonary/Chest: Effort normal and breath sounds normal. No stridor. She has no wheezes. She has no rales.  Abdominal: Soft. Bowel sounds are normal. She exhibits no distension. There is no abdominal tenderness.  Musculoskeletal:        General: No edema.     Cervical back: Neck supple.     Comments: Left lower extremity-wears a boot  Neurological: She is alert and oriented to person, place, and time. She has intact cranial nerves (2-12).  Skin: Skin is warm and moist.     Impression & Recommendation(s):  Impression:   ICD-10-CM   1. Coronary artery disease involving native coronary artery of native heart without angina pectoris  I25.10  2. Hx of CABG  Z95.1     3. S/P drug eluting coronary stent placement  Z95.5     4. NSTEMI (non-ST elevated myocardial infarction) (HCC)  I21.4     5. Essential hypertension  I10      6. Type 2 diabetes mellitus with complications (HCC)  E11.8     7. Hyperlipidemia LDL goal <55  E78.5     8. OSA (obstructive sleep apnea)  G47.33        Recommendation(s):  Coronary artery disease of native artery of native heart without angina pectoris (HCC) Hx of CABG S/P drug eluting coronary stent placement NSTEMI (non-ST elevated myocardial infarction) (HCC) History of stable angina. With up titration of antianginal therapy she has not had any reoccurrence of angina pectoris since last office visit. No hospitalizations or urgent care visits in the recent past due to cardiovascular symptoms. Continue amlodipine 5 mg p.o. twice daily. Continue Lopressor 50 mg p.o. twice daily.   Continue Ranexa 500 mg p.o. twice daily. Has sublingual nitroglycerin tablets to use on appearing basis if needed  Essential hypertension Office blood pressures are very well-controlled. Continue amlodipine 5 mg p.o. twice daily. Continue hydrochlorothiazide 12.5 mg p.o. as needed basis for SBP >130 mmHg. Continue losartan 100 mg p.o. daily. Continue Lopressor 50 mg p.o. twice daily  Type 2 diabetes mellitus with complications (HCC) Understands importance of glycemic control. Currently follows with Dr. Evlyn Kanner Currently on ARB, statin therapy Patient used to be on Jardiance in the past but discontinued for reasons unknown at this time.  Hyperlipidemia LDL goal <55 Currently on atorvastatin 40 mg p.o. daily.   She denies myalgia or other side effects. Most recent lipids dated April 2024, independently reviewed as noted above.  Orders Placed:  No orders of the defined types were placed in this encounter.  Final Medication List:   No orders of the defined types were placed in this encounter.   There are no discontinued medications.   Current Outpatient Medications:    acetaminophen (TYLENOL) 650 MG CR tablet, Take 1,300 mg by mouth at bedtime., Disp: , Rfl:    amLODipine (NORVASC) 5 MG  tablet, Take 1 tablet (5 mg total) by mouth in the morning and at bedtime., Disp: 180 tablet, Rfl: 3   atorvastatin (LIPITOR) 40 MG tablet, Take 1 tablet (40 mg total) by mouth at bedtime., Disp: 30 tablet, Rfl: 3   CALCIUM PO, Take 1 tablet by mouth in the morning and at bedtime., Disp: , Rfl:    Cetirizine HCl (ZYRTEC ALLERGY) 10 MG CAPS, Take 1 tablet by mouth daily., Disp: , Rfl:    cilostazol (PLETAL) 50 MG tablet, Take 1 tablet (50 mg total) by mouth 2 (two) times daily., Disp: 180 tablet, Rfl: 3   clopidogrel (PLAVIX) 75 MG tablet, Take 1 tablet (75 mg total) by mouth daily., Disp: 90 tablet, Rfl: 3   Coenzyme Q10 300 MG CAPS, Take 300 mg by mouth 2 (two) times daily., Disp: , Rfl:    D-Mannose 500 MG CAPS, Take 500 mg by mouth in the morning and at bedtime., Disp: , Rfl:    hydrochlorothiazide (HYDRODIURIL) 12.5 MG tablet, Take 12.5 mg by mouth daily as needed (Take if SBP>191mmHG.)., Disp: , Rfl:    LANTUS SOLOSTAR 100 UNIT/ML Solostar Pen, Inject 10 Units into the skin at bedtime. (Patient taking differently: Inject 40 Units into the skin at bedtime.), Disp: 15 mL, Rfl: 0   levothyroxine (SYNTHROID, LEVOTHROID) 75 MCG tablet, Take 75  mcg by mouth daily before breakfast. , Disp: , Rfl:    losartan (COZAAR) 100 MG tablet, Take 1 tablet (100 mg total) by mouth daily., Disp: 90 tablet, Rfl: 3   metoprolol tartrate (LOPRESSOR) 50 MG tablet, Take 1 tablet (50 mg total) by mouth 2 (two) times daily., Disp: 60 tablet, Rfl: 3   Multiple Vitamin (MULTIVITAMIN ADULT PO), Take 1 tablet by mouth daily., Disp: , Rfl:    nitroGLYCERIN (NITROSTAT) 0.4 MG SL tablet, Place 1 tablet (0.4 mg total) under the tongue every 5 (five) minutes as needed for chest pain., Disp: 25 tablet, Rfl: 1   ONETOUCH VERIO test strip, 1 each 2 (two) times daily., Disp: , Rfl:    Probiotic Product (ALIGN) 4 MG CAPS, Take 4 mg by mouth daily., Disp: , Rfl:    ranolazine (RANEXA) 500 MG 12 hr tablet, Take 1 tablet (500 mg total)  by mouth 2 (two) times daily., Disp: 180 tablet, Rfl: 0  Consent:   N/A  Disposition:   6 months or sooner if needed. Patient may be asked to follow-up sooner based on the results of the above-mentioned testing.  Her questions and concerns were addressed to her satisfaction. She voices understanding of the recommendations provided during this encounter.    Signed, Tessa Lerner, DO, [facility]  [facility]  [address] [place], [place] [address] 07/30/2023 1:22 PM

## 2023-07-30 NOTE — Patient Instructions (Signed)
Medication Instructions:   Your physician recommends that you continue on your current medications as directed. Please refer to the Current Medication list given to you today.  *If you need a refill on your cardiac medications before your next appointment, please call your pharmacy*    Follow-Up: At [facility], you and your health needs are our priority.  As part of our continuing mission to provide you with exceptional heart care, we have created designated Provider Care Teams.  These Care Teams include your primary Cardiologist (physician) and Advanced Practice Providers (APPs -  Physician Assistants and Nurse Practitioners) who all work together to provide you with the care you need, when you need it.  We recommend signing up for the patient portal called "MyChart".  Sign up information is provided on this After Visit Summary.  MyChart is used to connect with patients for Virtual Visits (Telemedicine).  Patients are able to view lab/test results, encounter notes, upcoming appointments, etc.  Non-urgent messages can be sent to your provider as well.   To learn more about what you can do with MyChart, go to [email or web address].    Your next appointment:   6 month(s)  Provider:   DR. Odis Hollingshead

## 2023-08-16 ENCOUNTER — Telehealth: Payer: Self-pay | Admitting: Cardiology

## 2023-08-16 MED ORDER — AMLODIPINE BESYLATE 5 MG PO TABS: 5.0000 mg | ORAL_TABLET | Freq: Two times a day (BID) | ORAL | 3 refills | Status: AC

## 2023-08-16 NOTE — Telephone Encounter (Signed)
*  STAT* If patient is at the pharmacy, call can be transferred to refill team.   1. Which medications need to be refilled? (please list name of each medication and dose if known)   amLODipine  (NORVASC ) 5 MG tablet     4. Which pharmacy/location (including street and city if local pharmacy) is medication to be sent to? [facility] - [place], [place] - [address]     5. Do they need a 30 day or 90 day supply? 90   Pt completely out

## 2023-10-03 ENCOUNTER — Other Ambulatory Visit: Payer: Self-pay

## 2023-10-03 DIAGNOSIS — I214 Non-ST elevation (NSTEMI) myocardial infarction: Secondary | ICD-10-CM

## 2023-10-03 DIAGNOSIS — Z955 Presence of coronary angioplasty implant and graft: Secondary | ICD-10-CM

## 2023-10-03 DIAGNOSIS — I25118 Atherosclerotic heart disease of native coronary artery with other forms of angina pectoris: Secondary | ICD-10-CM

## 2023-10-03 DIAGNOSIS — Z951 Presence of aortocoronary bypass graft: Secondary | ICD-10-CM

## 2023-10-03 MED ORDER — RANOLAZINE ER 500 MG PO TB12: 500.0000 mg | ORAL_TABLET | Freq: Two times a day (BID) | ORAL | 3 refills | Status: AC

## 2023-10-30 DIAGNOSIS — M545 Low back pain, unspecified: Secondary | ICD-10-CM | POA: Diagnosis not present

## 2023-11-07 DIAGNOSIS — R82998 Other abnormal findings in urine: Secondary | ICD-10-CM | POA: Diagnosis not present

## 2023-11-07 DIAGNOSIS — D509 Iron deficiency anemia, unspecified: Secondary | ICD-10-CM | POA: Diagnosis not present

## 2023-11-07 DIAGNOSIS — I119 Hypertensive heart disease without heart failure: Secondary | ICD-10-CM | POA: Diagnosis not present

## 2023-11-07 DIAGNOSIS — E669 Obesity, unspecified: Secondary | ICD-10-CM | POA: Diagnosis not present

## 2023-11-07 DIAGNOSIS — E785 Hyperlipidemia, unspecified: Secondary | ICD-10-CM | POA: Diagnosis not present

## 2023-11-07 DIAGNOSIS — I739 Peripheral vascular disease, unspecified: Secondary | ICD-10-CM | POA: Diagnosis not present

## 2023-11-07 DIAGNOSIS — M8580 Other specified disorders of bone density and structure, unspecified site: Secondary | ICD-10-CM | POA: Diagnosis not present

## 2023-11-07 DIAGNOSIS — I2510 Atherosclerotic heart disease of native coronary artery without angina pectoris: Secondary | ICD-10-CM | POA: Diagnosis not present

## 2023-11-07 DIAGNOSIS — I5189 Other ill-defined heart diseases: Secondary | ICD-10-CM | POA: Diagnosis not present

## 2023-11-07 DIAGNOSIS — E049 Nontoxic goiter, unspecified: Secondary | ICD-10-CM | POA: Diagnosis not present

## 2023-11-07 DIAGNOSIS — E1142 Type 2 diabetes mellitus with diabetic polyneuropathy: Secondary | ICD-10-CM | POA: Diagnosis not present

## 2023-11-07 DIAGNOSIS — E039 Hypothyroidism, unspecified: Secondary | ICD-10-CM | POA: Diagnosis not present

## 2023-11-07 DIAGNOSIS — E871 Hypo-osmolality and hyponatremia: Secondary | ICD-10-CM | POA: Diagnosis not present

## 2023-11-07 DIAGNOSIS — I10 Essential (primary) hypertension: Secondary | ICD-10-CM | POA: Diagnosis not present

## 2023-11-07 DIAGNOSIS — D126 Benign neoplasm of colon, unspecified: Secondary | ICD-10-CM | POA: Diagnosis not present

## 2023-11-08 DIAGNOSIS — S92355A Nondisplaced fracture of fifth metatarsal bone, left foot, initial encounter for closed fracture: Secondary | ICD-10-CM | POA: Diagnosis not present

## 2023-11-08 DIAGNOSIS — S8262XD Displaced fracture of lateral malleolus of left fibula, subsequent encounter for closed fracture with routine healing: Secondary | ICD-10-CM | POA: Diagnosis not present

## 2023-11-12 DIAGNOSIS — N390 Urinary tract infection, site not specified: Secondary | ICD-10-CM | POA: Diagnosis not present

## 2023-11-19 DIAGNOSIS — M791 Myalgia, unspecified site: Secondary | ICD-10-CM | POA: Diagnosis not present

## 2023-11-19 DIAGNOSIS — M545 Low back pain, unspecified: Secondary | ICD-10-CM | POA: Diagnosis not present

## 2023-12-03 ENCOUNTER — Telehealth: Payer: Self-pay | Admitting: Cardiology

## 2023-12-03 DIAGNOSIS — I25118 Atherosclerotic heart disease of native coronary artery with other forms of angina pectoris: Secondary | ICD-10-CM

## 2023-12-03 DIAGNOSIS — I214 Non-ST elevation (NSTEMI) myocardial infarction: Secondary | ICD-10-CM

## 2023-12-03 DIAGNOSIS — Z955 Presence of coronary angioplasty implant and graft: Secondary | ICD-10-CM

## 2023-12-03 DIAGNOSIS — Z951 Presence of aortocoronary bypass graft: Secondary | ICD-10-CM

## 2023-12-03 MED ORDER — METOPROLOL TARTRATE 50 MG PO TABS: 50.0000 mg | ORAL_TABLET | Freq: Two times a day (BID) | ORAL | 2 refills | Status: AC

## 2023-12-03 NOTE — Telephone Encounter (Signed)
*  STAT* If patient is at the pharmacy, call can be transferred to refill team.   1. Which medications need to be refilled? (please list name of each medication and dose if known)  metoprolol  tartrate (LOPRESSOR ) 50 MG tablet    2. Would you like to learn more about the convenience, safety, & potential cost savings by using the [facility]? N/A     3. Are you open to using the [facility] ([facility]. N/A   4. Which pharmacy/location (including street and city if local pharmacy) is medication to be sent to? [facility] - [place], [place] - [address]    5. Do they need a 30 day or 90 day supply? 90 day supply   Only has one tablet left

## 2023-12-03 NOTE — Telephone Encounter (Signed)
 Pt's medication was sent to pt's pharmacy as requested. Confirmation received.

## 2023-12-04 ENCOUNTER — Other Ambulatory Visit: Payer: Self-pay | Admitting: Family Medicine

## 2023-12-04 DIAGNOSIS — Z1231 Encounter for screening mammogram for malignant neoplasm of breast: Secondary | ICD-10-CM

## 2023-12-20 DIAGNOSIS — H52203 Unspecified astigmatism, bilateral: Secondary | ICD-10-CM | POA: Diagnosis not present

## 2023-12-20 DIAGNOSIS — E113292 Type 2 diabetes mellitus with mild nonproliferative diabetic retinopathy without macular edema, left eye: Secondary | ICD-10-CM | POA: Diagnosis not present

## 2023-12-20 DIAGNOSIS — Z961 Presence of intraocular lens: Secondary | ICD-10-CM | POA: Diagnosis not present

## 2024-01-07 ENCOUNTER — Ambulatory Visit

## 2024-01-09 ENCOUNTER — Encounter: Payer: Self-pay | Admitting: Hematology and Oncology

## 2024-01-09 ENCOUNTER — Ambulatory Visit
Admission: RE | Admit: 2024-01-09 | Discharge: 2024-01-09 | Disposition: A | Discharge status: Home / Self Care | Source: Ambulatory Visit | Attending: Family Medicine | Admitting: Family Medicine

## 2024-01-09 DIAGNOSIS — Z1231 Encounter for screening mammogram for malignant neoplasm of breast: Secondary | ICD-10-CM

## 2024-01-20 ENCOUNTER — Ambulatory Visit: Payer: Medicare Other | Admitting: Cardiology

## 2024-01-23 ENCOUNTER — Encounter: Payer: Self-pay | Admitting: Cardiology

## 2024-01-23 ENCOUNTER — Ambulatory Visit: Payer: Medicare Other | Attending: Cardiology | Admitting: Cardiology

## 2024-01-23 VITALS — BP 116/73 | HR 69 | Resp 16 | Ht 61.0 in | Wt 151.4 lb

## 2024-01-23 DIAGNOSIS — E785 Hyperlipidemia, unspecified: Secondary | ICD-10-CM | POA: Diagnosis not present

## 2024-01-23 DIAGNOSIS — I214 Non-ST elevation (NSTEMI) myocardial infarction: Secondary | ICD-10-CM | POA: Diagnosis not present

## 2024-01-23 DIAGNOSIS — I25118 Atherosclerotic heart disease of native coronary artery with other forms of angina pectoris: Secondary | ICD-10-CM | POA: Diagnosis not present

## 2024-01-23 DIAGNOSIS — Z955 Presence of coronary angioplasty implant and graft: Secondary | ICD-10-CM

## 2024-01-23 DIAGNOSIS — Z951 Presence of aortocoronary bypass graft: Secondary | ICD-10-CM

## 2024-01-23 DIAGNOSIS — I10 Essential (primary) hypertension: Secondary | ICD-10-CM | POA: Diagnosis not present

## 2024-01-23 DIAGNOSIS — E118 Type 2 diabetes mellitus with unspecified complications: Secondary | ICD-10-CM | POA: Diagnosis not present

## 2024-01-23 MED ORDER — ATORVASTATIN CALCIUM 40 MG PO TABS
40.0000 mg | ORAL_TABLET | Freq: Every day | ORAL | 3 refills | Status: AC
Start: 2024-01-23 — End: ?

## 2024-01-23 NOTE — Progress Notes (Signed)
 Cardiology Office Note:  .   Date:  01/23/2024  ID:  Susan Barnett, DOB 10/20/1945, MRN [id number] PCP:  Roselind Congo, MD  Former Cardiology Providers: None [facility] Providers Cardiologist:  Olinda Bertrand, DO, [facility] (established care April 2024) Sleep Medicine:  Gaylyn Keas, MD  Electrophysiologist:  None  Click to update primary MD,subspecialty MD or APP then REFRESH:1}    Chief Complaint  Patient presents with   Coronary artery disease involving native coronary artery of   Follow-up    History of Present Illness: .   Susan Barnett is a 78 y.o. Caucasian female whose past medical history and cardiovascular risk factors includes: CAD status post CABG 2015 (SVG to diagonal and SVG to PDA) and s/p PCI to LCX (11/2022), hypertension, diabetes mellitus type 2, hyperlipidemia, OSA .  She has known coronary artery disease and during her heart catheterization in May 2024 she was noted to have patent stents with moderate to severe disease but the vessels are small in caliber and no additional interventions were performed.  Since then she is been managed medically.  She presents today for follow-up.  In the past she noted that taking the amlodipine  and metoprolol  twice a day her symptoms of chest pain and hypotension are very well-controlled.    Since last office visit patient denies any anginal chest pain or heart failure symptoms.  No hospitalizations or urgent care visits for cardiovascular reasons.  She has been compliant with her medical therapy.  No significant weight gain.  Physical endurance remains stable.  Home SBP ranges within acceptable limits.  She only uses hydrochlorothiazide  on as needed basis if needed.  Patient states that she ends up using 1 sublingual nitroglycerin  tablet every 3 months.  She is also working with Dr. Jesse Moritz with regards to glycemia control.  She is requesting refills on Jardiance  and atorvastatin .  Patient is accompanied by her husband who  provides collateral history as part of today's encounter.  Review of Systems: .   Review of Systems  Cardiovascular:  Negative for chest pain, claudication, irregular heartbeat, leg swelling, near-syncope, orthopnea, palpitations, paroxysmal nocturnal dyspnea and syncope.  Respiratory:  Negative for shortness of breath.   Hematologic/Lymphatic: Negative for bleeding problem.    Studies Reviewed:   EKG: EKG Interpretation Date/Time:  Thursday January 23 2024 11:37:04 EDT Ventricular Rate:  72 PR Interval:  154 QRS Duration:  80 QT Interval:  404 QTC Calculation: 442 R Axis:   9  Text Interpretation: Normal sinus rhythm Consider Inferior infarct (cited on or before 23-Dec-2022) Consider Anterior infarct , age undetermined When compared with ECG of 24-Dec-2022 07:05, Anterior infarct is now Present ST no longer elevated in Inferior leads Confirmed by Olinda Bertrand [phone]) on 01/23/2024 11:59:29 AM   Echocardiogram: 09/01/2018: LVEF 55 to 60%, grade 2 diastolic dysfunction, trivial AR, mild MR, mild left atrial enlargement.   December 03, 2022:  1. Left ventricular ejection fraction, by estimation, is 60 to 65%. The  left ventricle has normal function. There is mild concentric left  ventricular hypertrophy. Left ventricular diastolic parameters are  consistent with Grade II diastolic dysfunction  (pseudonormalization). On the 2-chamber contrast images there appears to  be a small segment of akinesis in the basilar to mid inferior wall. This  is not seen in any other images.   2. Right ventricular systolic function is normal. The right ventricular  size is normal. There is normal pulmonary artery systolic pressure.   3. Left atrial size  was mildly dilated.   4. The mitral valve is normal in structure. Mild mitral valve  regurgitation. No evidence of mitral stenosis.   5. The aortic valve is tricuspid. There is mild calcification of the  aortic valve. Aortic valve regurgitation is trivial.  No aortic stenosis is  present.   6. The inferior vena cava is normal in size with <50% respiratory  variability, suggesting right atrial pressure of 8 mmHg.    Stress Testing: MPI 05/18/2020 Nuclear stress EF: 72%. No wall motion abnormalities. There was no ST segment deviation noted during stress. The study is normal. This is a low risk study. No evidence of ischemia or infarction. Prior CABG.   Heart Catheterization: December 03, 2022: LM: Normal LAD: Prox-mid 30% diffuse disease, distal 75% diffuse disease Lcx: Mid 75% diffuse disease RCA: Prox 80% diffuse disease, followed 100 mid ISR SVG-PDA: Patent SVG-diag: Could not be visualized   Temporary transvenous pacemaker placement.   Patient was very confused and kept attempting to sit up.  I did not feel leaving femoral sheath in place was safe.  Therefore, I decided to go ahead and place 5 [place] Mynx closure.   With transient hypotension, patient was very confused and disoriented.  Code stroke was called.  Patient was evaluated by neurology and thought to be less likely to have had stroke.  CT head was ordered out of abundance caution.  Patient is mental alertness improved with time.  She will be kept into heart ICU overnight for monitoring.  Temporary pacemaker will be removed tomorrow morning if no further use is needed.   Unusually prolonged procedure time due to intraprocedural hypotension, complete AV block requiring placement of temporary transvenous placement, disorientation requiring further neurological evaluation.   December 10, 2022: LM: Mild disease LAD: Prox-mid 30% diffuse disease, distal 75% diffuse disease Lcx: Prox 30%, mid 75% diffuse disease, followed by focal 90% stenosis RCA: Not engaged today. Known Prox 80% diffuse disease, followed 100 mid ISR SVG-PDA: Not engaged today. Known patent SVG-diag: Patent. No significant disease   LVEDP 17 mmHg   Successful percutaneous coronary intervention mid LCx         PTCA and stent placement with overlapping stents        2.0 X 18 mm and 2.25 X 15 Onyx drug-eluting stent        Post dilatation with 2.25 mm stent balloon and 2.5X15 mm Lasana balloon up to 16 atm    12/24/2022:   The left ventricular ejection fraction is 55-65% by visual estimate.   LM: Normal LAD: Prox-mid 30% disease      Severe moderate disease in small caliber mid to distal LAD LCx: Prox 30% disease      Patent mid LCx stents RCA: Prox 80% disease, mid occlusion SVG-PDA: Patent. Slow retrograde flow into PLA through lesion in ostial PDA. FLow in PLA is somewhat better than 12/10/2022 SVG-Diag: PAtent with diffuse moderate disease, uncahnged from 12/03/2022   LVEDP and LVEF normal   Multiple areas of small caliber vessel moderate to severe disease, none worse with 12/03/2022 and 12/10/2022 caths. No specific culprit vessel identified that is amenable to any percutaneous or surgical intervention. Recommend continued medical management.    Recommend plavix  and pletal  50 mg bid for antiplatelet regimen. Recommend adding Ranexa  500 mg bid for small vessel disease.   Cardiac monitor (Zio Patch): December 06, 2022 -Dec 20, 2022 Dominant rhythm sinus with first-degree AV block. Heart rate 55-179 bpm. Avg HR 75 bpm. No  atrial fibrillation, high grade AV block, pauses (3 seconds or longer). Total ventricular ectopic burden <1%. One asymptomatic episode of NSVT (5 beats, 2.6 seconds, max HR 179 bpm).  Total supraventricular ectopic burden <1%. Rare brief episodes of PSVT Patient triggered events: 9. Underlying rhythm sinus with rare PVCs.   RADIOLOGY: NA  Risk Assessment/Calculations:   NA   Labs:       Latest Ref Rng & Units 12/25/2022   11:06 AM 12/23/2022    5:18 AM 12/11/2022    2:25 AM  CBC  WBC 4.0 - 10.5 K/uL 10.4  13.3  14.2   Hemoglobin 12.0 - 15.0 g/dL 16.1  09.6  04.5   Hematocrit 36.0 - 46.0 % 38.5  39.8  36.0   Platelets 150 - 400 K/uL 356  427  391        Latest Ref  Rng & Units 12/25/2022   11:06 AM 12/23/2022    5:18 AM 12/11/2022    2:25 AM  BMP  Glucose 70 - 99 mg/dL 409  811  914   BUN 8 - 23 mg/dL 18  16  10    Creatinine 0.44 - 1.00 mg/dL 7.82  9.56  2.13   Sodium 135 - 145 mmol/L 132  134  134   Potassium 3.5 - 5.1 mmol/L 4.9  3.7  2.8   Chloride 98 - 111 mmol/L 103  100  101   CO2 22 - 32 mmol/L 19  22  22    Calcium  8.9 - 10.3 mg/dL 8.7  9.8  8.7       Latest Ref Rng & Units 12/25/2022   11:06 AM 12/23/2022    5:18 AM 12/11/2022    2:25 AM  CMP  Glucose 70 - 99 mg/dL 086  578  469   BUN 8 - 23 mg/dL 18  16  10    Creatinine 0.44 - 1.00 mg/dL 6.29  5.28  4.13   Sodium 135 - 145 mmol/L 132  134  134   Potassium 3.5 - 5.1 mmol/L 4.9  3.7  2.8   Chloride 98 - 111 mmol/L 103  100  101   CO2 22 - 32 mmol/L 19  22  22    Calcium  8.9 - 10.3 mg/dL 8.7  9.8  8.7     Lab Results  Component Value Date   CHOL 114 12/05/2022   HDL 39 (L) 12/05/2022   LDLCALC 45 12/05/2022   LDLDIRECT 51 12/05/2022   TRIG 149 12/05/2022   CHOLHDL 2.9 12/05/2022   No results for input(s): LIPOA in the last 8760 hours.  No components found for: NTPROBNP No results for input(s): PROBNP in the last 8760 hours.  No results for input(s): TSH in the last 8760 hours.  External Labs: Collected: November 07, 2023 [facility] database. Total cholesterol 116, triglycerides 112, HDL 46, LDL 48. A1c 8.0.  Physical Exam:    Today's Vitals   01/23/24 1133  BP: 116/73  Pulse: 69  Resp: 16  SpO2: 96%  Weight: 151 lb 6.4 oz (68.7 kg)  Height: 5' 1 (1.549 m)   Body mass index is 28.61 kg/m. Wt Readings from Last 3 Encounters:  01/23/24 151 lb 6.4 oz (68.7 kg)  07/30/23 147 lb (66.7 kg)  04/23/23 143 lb (64.9 kg)    Physical Exam  Constitutional: No distress.  Age appropriate, hemodynamically stable, walks with walker.  Neck: No JVD present.  Cardiovascular: Normal rate, regular rhythm, S1 normal, S2 normal, intact distal pulses and  normal pulses. Exam reveals  no gallop, no S3 and no S4.  No murmur heard. Pulmonary/Chest: Effort normal and breath sounds normal. No stridor. She has no wheezes. She has no rales.  Abdominal: Soft. Bowel sounds are normal. She exhibits no distension. There is no abdominal tenderness.  Musculoskeletal:        General: No edema.     Cervical back: Neck supple.     Comments: Left lower extremity-wears a boot  Neurological: She is alert and oriented to person, place, and time. She has intact cranial nerves (2-12).  Skin: Skin is warm and moist.     Impression & Recommendation(s):  Impression:   ICD-10-CM   1. Coronary artery disease of native artery of native heart with stable angina pectoris (HCC)  I25.118 EKG 12-Lead    atorvastatin  (LIPITOR) 40 MG tablet    2. Hx of CABG  Z95.1 atorvastatin  (LIPITOR) 40 MG tablet    3. S/P drug eluting coronary stent placement  Z95.5 atorvastatin  (LIPITOR) 40 MG tablet    4. NSTEMI (non-ST elevated myocardial infarction) (HCC)  I21.4 atorvastatin  (LIPITOR) 40 MG tablet    5. Essential hypertension  I10     6. Type 2 diabetes mellitus with complications (HCC)  E11.8     7. Hyperlipidemia LDL goal <55  E78.5        Recommendation(s):  Coronary artery disease of native artery of native heart without angina pectoris (HCC) Hx of CABG S/P drug eluting coronary stent placement NSTEMI (non-ST elevated myocardial infarction) (HCC) Denies anginal chest pain or heart failure symptoms. Requires 1 sublingual nitroglycerin  tablets every 3 months. EKG sinus rhythm without ischemia or injury pattern Has done well with taking amlodipine  and Lopressor  on twice daily dosing as opposed to daily No hospitalizations or urgent care visits in the recent past due to cardiovascular symptoms. Continue amlodipine  5 mg p.o. twice daily. Continue Lopressor  50 mg p.o. twice daily.   No longer on Ranexa , has not had any reoccurrence of anginal chest pain  Outside labs from March 2025  independently reviewed, LDL is at goal, 48 mg/dL Reemphasize importance of glycemic control and secondary prevention  Essential hypertension Office blood pressures are very well-controlled. Continue amlodipine  5 mg p.o. twice daily. Continue hydrochlorothiazide  12.5 mg p.o. as needed basis for SBP >140 mmHg, has had history of orthostasis in the past. Continue losartan  100 mg p.o. daily. Continue Lopressor  50 mg p.o. twice daily  Type 2 diabetes mellitus with complications (HCC) Most recent hemoglobin A1c 8.0, March 2025 Understands importance of glycemic control. Currently follows with Dr. Jesse Moritz Currently on ARB, statin therapy Patient has had yeast infections in the past but no reoccurrence.  She has done well with Jardiance  for the last 1 year and is requesting a refill.  Patient is aware that Jardiance  increases the chances of urinary tract infection.  Patient would like a refill on Jardiance  for now but will keep an eye out for any additional yeast infections or UTIs.  Hyperlipidemia LDL goal <55 Currently on atorvastatin  40 mg p.o. daily.  Refilled. Most recent LDL 48 mg/dL, outside labs independently reviewed. She denies myalgia or other side effects.  Orders Placed:  Orders Placed This Encounter  Procedures   EKG 12-Lead   Final Medication List:    Meds ordered this encounter  Medications   atorvastatin  (LIPITOR) 40 MG tablet    Sig: Take 1 tablet (40 mg total) by mouth at bedtime.    Dispense:  90 tablet  Refill:  3    Medications Discontinued During This Encounter  Medication Reason   atorvastatin  (LIPITOR) 40 MG tablet Reorder     Current Outpatient Medications:    acetaminophen  (TYLENOL ) 650 MG CR tablet, Take 1,300 mg by mouth at bedtime., Disp: , Rfl:    amLODipine  (NORVASC ) 5 MG tablet, Take 1 tablet (5 mg total) by mouth in the morning and at bedtime., Disp: 180 tablet, Rfl: 3   CALCIUM  PO, Take 1 tablet by mouth in the morning and at bedtime., Disp: ,  Rfl:    Cetirizine HCl (ZYRTEC ALLERGY) 10 MG CAPS, Take 1 tablet by mouth daily., Disp: , Rfl:    cilostazol  (PLETAL ) 50 MG tablet, Take 1 tablet (50 mg total) by mouth 2 (two) times daily., Disp: 180 tablet, Rfl: 3   clopidogrel  (PLAVIX ) 75 MG tablet, Take 1 tablet (75 mg total) by mouth daily., Disp: 90 tablet, Rfl: 3   Coenzyme Q10 300 MG CAPS, Take 300 mg by mouth 2 (two) times daily., Disp: , Rfl:    D-Mannose 500 MG CAPS, Take 500 mg by mouth in the morning and at bedtime., Disp: , Rfl:    hydrochlorothiazide  (HYDRODIURIL ) 12.5 MG tablet, Take 12.5 mg by mouth daily as needed (Take if SBP>128mmHG.)., Disp: , Rfl:    JARDIANCE  10 MG TABS tablet, Take 10 mg by mouth daily., Disp: , Rfl:    LANTUS  SOLOSTAR 100 UNIT/ML Solostar Pen, Inject 10 Units into the skin at bedtime., Disp: 15 mL, Rfl: 0   levothyroxine  (SYNTHROID , LEVOTHROID) 75 MCG tablet, Take 75 mcg by mouth daily before breakfast. , Disp: , Rfl:    losartan  (COZAAR ) 100 MG tablet, Take 1 tablet (100 mg total) by mouth daily., Disp: 90 tablet, Rfl: 3   metoprolol  tartrate (LOPRESSOR ) 50 MG tablet, Take 1 tablet (50 mg total) by mouth 2 (two) times daily., Disp: 180 tablet, Rfl: 2   Multiple Vitamin (MULTIVITAMIN ADULT PO), Take 1 tablet by mouth daily., Disp: , Rfl:    nitroGLYCERIN  (NITROSTAT ) 0.4 MG SL tablet, Place 1 tablet (0.4 mg total) under the tongue every 5 (five) minutes as needed for chest pain., Disp: 25 tablet, Rfl: 1   ONETOUCH VERIO test strip, 1 each 2 (two) times daily., Disp: , Rfl:    pantoprazole  (PROTONIX ) 40 MG tablet, Take 40 mg by mouth every morning., Disp: , Rfl:    Probiotic Product (ALIGN) 4 MG CAPS, Take 4 mg by mouth daily., Disp: , Rfl:    ranolazine  (RANEXA ) 500 MG 12 hr tablet, Take 1 tablet (500 mg total) by mouth 2 (two) times daily., Disp: 180 tablet, Rfl: 3   atorvastatin  (LIPITOR) 40 MG tablet, Take 1 tablet (40 mg total) by mouth at bedtime., Disp: 90 tablet, Rfl: 3  Consent:    N/A  Disposition:   1 year follow-up sooner if needed  Her questions and concerns were addressed to her satisfaction. She voices understanding of the recommendations provided during this encounter.    Signed, Awilda Bogus, [facility]  A Division of [facility] [address] [place], [place] [address]  [place], [place] [address]  01/23/2024 12:34 PM

## 2024-01-23 NOTE — Patient Instructions (Addendum)
 Medication Instructions:  Refill for Atorvastatin  (Lipitor) has been sent to your pharmacy.  *If you need a refill on your cardiac medications before your next appointment, please call your pharmacy*  Lab Work: None ordered today. If you have labs (blood work) drawn today and your tests are completely normal, you will receive your results only by: MyChart Message (if you have MyChart) OR A paper copy in the mail If you have any lab test that is abnormal or we need to change your treatment, we will call you to review the results.  Testing/Procedures: None ordered today.  Follow-Up: At [facility], you and your health needs are our priority.  As part of our continuing mission to provide you with exceptional heart care, our providers are all part of one team.  This team includes your primary Cardiologist (physician) and Advanced Practice Providers or APPs (Physician Assistants and Nurse Practitioners) who all work together to provide you with the care you need, when you need it.  Your next appointment:   1 year(s)  Provider:   Olinda Bertrand, DO

## 2024-01-30 ENCOUNTER — Ambulatory Visit
Admission: EM | Admit: 2024-01-30 | Discharge: 2024-01-30 | Disposition: A | Discharge status: Home / Self Care | Attending: Physician Assistant | Admitting: Physician Assistant

## 2024-01-30 DIAGNOSIS — N3000 Acute cystitis without hematuria: Secondary | ICD-10-CM

## 2024-01-30 LAB — POCT URINALYSIS DIP (MANUAL ENTRY)
Bilirubin, UA: NEGATIVE
Glucose, UA: 500 mg/dL — AB
Ketones, POC UA: NEGATIVE mg/dL
Nitrite, UA: NEGATIVE
Protein Ur, POC: NEGATIVE mg/dL
Spec Grav, UA: 1.015 (ref 1.010–1.025)
Urobilinogen, UA: 0.2 U/dL
pH, UA: 6 (ref 5.0–8.0)

## 2024-01-30 MED ORDER — CIPROFLOXACIN HCL 500 MG PO TABS: 500.0000 mg | ORAL_TABLET | Freq: Two times a day (BID) | ORAL | 0 refills | Status: AC

## 2024-01-30 NOTE — ED Triage Notes (Signed)
Dysuria and frequency today.

## 2024-01-30 NOTE — ED Provider Notes (Signed)
 [name]    CSN: [id number] Arrival date & time: 01/30/24  1921      History   Chief Complaint Chief Complaint  Patient presents with   Urinary Frequency    HPI Susan Barnett is a 78 y.o. female.   Patient here today for evaluation of urinary frequency and dysuria that started this afternoon.  She has had UTIs in the past and is concerned for same.  She denies any fever.  She does have some lower abdominal pain and back pain.  She has not any nausea or vomiting.  The history is provided by the patient.    Past Medical History:  Diagnosis Date   Anemia    mild   Anginal pain (HCC)    with exertion, last time prior to cardiac caht 06/02/14   Anxiety    situational   Arthritis    Breast cancer (HCC) 2018   Left Breast Cancer   CAD (coronary artery disease)    Dr. Pauletta Boroughs follows, no problems now   [facility])    Breast cancer   Complication of anesthesia    Diabetes mellitus, type 2 (HCC)    TYPE 2   Dysrhythmia    occ skips a beat, rate was fast before beta blocker.   Gastric polyp    Genetic testing 04/12/2017   Ms. Fedie underwent genetic counseling and testing for hereditary cancer syndromes on 02/19/2017. Her results were negative for mutations in all 46 genes analyzed by [facility]'s 46-gene Common Hereditary Cancers Panel. Genes analyzed include: APC, ATM, AXIN2, BARD1, BMPR1A, BRCA1, BRCA2, BRIP1, CDH1, CDKN2A, CHEK2, CTNNA1, DICER1, EPCAM, GREM1, HOXB13, KIT, MEN1, MLH1, MSH2, MSH3, MSH6, MUTYH, NBN   GERD (gastroesophageal reflux disease)    Headache    hx of   Heartburn    Hiatal hernia    mild head tremors   History of radiation therapy 01/30/17- 02/21/17   Left Breast 42.56 Gy in 16 fractions   Hypertension    Hypothyroidism    Occasional tremors    of head, slight hands   Osteopenia    Personal history of radiation therapy 2018   Left Breast Cancer   Pneumonia    PONV (postoperative nausea and vomiting) 02/24/14   Shortness  of breath    with exertion   Sleep apnea    cpap used nightly x 2 yrs now.    Patient Active Problem List   Diagnosis Date Noted   Elevated troponin 12/23/2022   Hx of heart artery stent 12/23/2022   Benign hypertension 12/23/2022   Chronic heart failure with preserved ejection fraction (HFpEF) (HCC) 12/23/2022   Coronary artery disease of native artery of native heart with stable angina pectoris (HCC) 12/23/2022   Unstable angina (HCC) 12/10/2022   Transient complete heart block (HCC) 12/05/2022   NSTEMI (non-ST elevated myocardial infarction) (HCC) 12/03/2022   Chest pain of uncertain etiology 12/03/2022   Hypokalemia 12/03/2022   Chronic diastolic CHF (congestive heart failure) (HCC) 12/03/2022   Hx of CABG 12/03/2022   DM type 2 with diabetic mixed hyperlipidemia (HCC) 12/03/2022   Sleep apnea in adult 12/03/2022   Atherosclerosis of native coronary artery of native heart with unstable angina pectoris (HCC) 12/03/2022   Hyponatremia 12/18/2019   Iron  deficiency anemia 08/20/2017   OSA (obstructive sleep apnea) 05/16/2017   Genetic testing 04/12/2017   Malignant neoplasm of upper-outer quadrant of left breast in female, estrogen receptor positive (HCC) 10/15/2016   Hyperlipidemia 07/14/2015  Coronary artery disease involving native coronary artery of native heart with unstable angina pectoris (HCC) 06/14/2014   Lateral meniscal tear 02/23/2014   GASTRIC POLYP 11/19/2007   Acquired hypothyroidism 11/19/2007   Type 2 diabetes mellitus with hyperglycemia, with long-term current use of insulin  (HCC) 11/19/2007   Primary hypertension 11/19/2007   GERD (gastroesophageal reflux disease) 11/19/2007   HIATAL HERNIA 11/19/2007   RECTAL BLEEDING 11/19/2007    Past Surgical History:  Procedure Laterality Date   BREAST BIOPSY Left 1987   BREAST BIOPSY Left 2019   BREAST LUMPECTOMY Left 01/25/2017   BREAST LUMPECTOMY WITH RADIOACTIVE SEED AND SENTINEL LYMPH NODE BIOPSY Left  12/25/2016   Procedure: BREAST LUMPECTOMY WITH RADIOACTIVE SEED AND SENTINEL LYMPH NODE BIOPSY;  Surgeon: Ayesha Lente, MD;  [facility] OR;  Service: General;  Laterality: Left;   CARDIAC CATHETERIZATION  06/02/2014   LAD 30%, D1 80%, CFX 755, RCA > 95% ISR, rx w/ PTCA, heavy calcification, EF 65%   CORONARY ARTERY BYPASS GRAFT N/A 06/14/2014   Procedure: CORONARY ARTERY BYPASS GRAFTING (CABG);  Surgeon: Bartley Lightning, MD;  Location: [facility] OR;  Service: Open Heart Surgery;  Laterality: N/A;  Times 2 using endoscopically harvested right saphenous vein.   CORONARY STENT INTERVENTION N/A 12/10/2022   Procedure: CORONARY STENT INTERVENTION;  Surgeon: Cody Das, MD;  [facility] INV[facility] LAB;  Service: Cardiovascular;  Laterality: N/A;   coronary stents     x3 stents placed-prior ablation   CORONARY ULTRASOUND/IVUS N/A 12/10/2022   Procedure: Coronary Ultrasound/IVUS;  Surgeon: Cody Das, MD;  [facility];  Service: Cardiovascular;  Laterality: N/A;   DILATION AND CURETTAGE OF UTERUS     ELBOW FRACTURE SURGERY Left    INCONTINENCE SURGERY     sling   KNEE ARTHROSCOPY Right 02/24/2014   Procedure: RIGHT KNEE ARTHROSCOPY WITH DEBRIDEMENT ;  Surgeon: Aurther Blue, MD;  Location: [facility] ORS;  Service: Orthopedics;  Laterality: Right;   LEFT HEART CATH AND CORS/GRAFTS ANGIOGRAPHY N/A 12/03/2022   Procedure: LEFT HEART CATH AND CORS/GRAFTS ANGIOGRAPHY;  Surgeon: Cody Das, MD;  [facility];  Service: Cardiovascular;  Laterality: N/A;   LEFT HEART CATH AND CORS/GRAFTS ANGIOGRAPHY N/A 12/10/2022   Procedure: LEFT HEART CATH AND CORS/GRAFTS ANGIOGRAPHY;  Surgeon: Cody Das, MD;  [facility];  Service: Cardiovascular;  Laterality: N/A;   LEFT HEART CATH AND CORS/GRAFTS ANGIOGRAPHY N/A 12/24/2022   Procedure: LEFT HEART CATH AND CORS/GRAFTS ANGIOGRAPHY;  Surgeon: Cody Das, MD;  [facility];  Service: Cardiovascular;  Laterality: N/A;   LEFT HEART CATHETERIZATION WITH CORONARY ANGIOGRAM N/A 06/02/2014   Procedure: LEFT HEART CATHETERIZATION WITH CORONARY ANGIOGRAM;  Surgeon: Mickiel Albany, MD;  Location: [facility] CATH LAB;  Service: Cardiovascular;  Laterality: N/A;   TEE WITHOUT CARDIOVERSION N/A 06/14/2014   Procedure: TRANSESOPHAGEAL ECHOCARDIOGRAM (TEE);  Surgeon: Bartley Lightning, MD;  Location: [facility] OR;  Service: Open Heart Surgery;  Laterality: N/A;   TEMPORARY PACEMAKER N/A 12/03/2022   Procedure: TEMPORARY PACEMAKER;  Surgeon: Cody Das, MD;  [facility];  Service: Cardiovascular;  Laterality: N/A;    OB History   No obstetric history on file.      Home Medications    Prior to Admission medications   Medication Sig Start Date End Date Taking? Authorizing Provider  acetaminophen  (TYLENOL ) 650 MG CR tablet Take 1,300 mg by mouth at bedtime.   Yes  [provider]  amLODipine  (NORVASC ) 5 MG tablet Take 1 tablet (5 mg total) by mouth in the morning and at bedtime. 08/16/23  Yes Tolia, Sunit, DO  atorvastatin  (LIPITOR) 40 MG tablet Take 1 tablet (40 mg total) by mouth at bedtime. 01/23/24  Yes Tolia, Sunit, DO  CALCIUM  PO Take 1 tablet by mouth in the morning and at bedtime.   Yes [provider]  Cetirizine HCl (ZYRTEC ALLERGY) 10 MG CAPS Take 1 tablet by mouth daily. 07/06/19  Yes [provider]  cilostazol  (PLETAL ) 50 MG tablet Take 1 tablet (50 mg total) by mouth 2 (two) times daily. 04/23/23 04/22/24 Yes Tolia, Sunit, DO  ciprofloxacin (CIPRO) 500 MG tablet Take 1 tablet (500 mg total) by mouth every 12 (twelve) hours. 01/30/24  Yes Vernestine Gondola, PA-C  clopidogrel  (PLAVIX ) 75 MG tablet Take 1 tablet (75 mg total) by mouth daily. 04/23/23 04/22/24 Yes Tolia, Sunit, DO  Coenzyme Q10 300 MG CAPS Take 300 mg by mouth 2 (two) times daily.   Yes [provider]  D-Mannose 500 MG CAPS Take 500 mg by mouth in the  morning and at bedtime.   Yes [provider]  JARDIANCE  10 MG TABS tablet Take 10 mg by mouth daily. 12/17/23  Yes [provider]  LANTUS  SOLOSTAR 100 UNIT/ML Solostar Pen Inject 10 Units into the skin at bedtime. 12/11/22  Yes Patwardhan, Manish J, MD  losartan  (COZAAR ) 100 MG tablet Take 1 tablet (100 mg total) by mouth daily. 04/23/23  Yes Tolia, Sunit, DO  metoprolol  tartrate (LOPRESSOR ) 50 MG tablet Take 1 tablet (50 mg total) by mouth 2 (two) times daily. 12/03/23  Yes Tolia, Sunit, DO  Multiple Vitamin (MULTIVITAMIN ADULT PO) Take 1 tablet by mouth daily.   Yes [provider]  [facility] VERIO test strip 1 each 2 (two) times daily. 04/18/20  Yes [provider]  pantoprazole  (PROTONIX ) 40 MG tablet Take 40 mg by mouth every morning. 10/28/23  Yes [provider]  Probiotic Product (ALIGN) 4 MG CAPS Take 4 mg by mouth daily.   Yes [provider]  ranolazine  (RANEXA ) 500 MG 12 hr tablet Take 1 tablet (500 mg total) by mouth 2 (two) times daily. 10/03/23  Yes Tolia, Sunit, DO  hydrochlorothiazide  (HYDRODIURIL ) 12.5 MG tablet Take 12.5 mg by mouth daily as needed (Take if SBP>146mmHG.). Patient not taking: Reported on 01/30/2024    [provider]  levothyroxine  (SYNTHROID , LEVOTHROID) 75 MCG tablet Take 75 mcg by mouth daily before breakfast.     [provider]  nitroGLYCERIN  (NITROSTAT ) 0.4 MG SL tablet Place 1 tablet (0.4 mg total) under the tongue every 5 (five) minutes as needed for chest pain. 04/23/23 01/23/24  Olinda Bertrand, DO    Family History Family History  Problem Relation Age of Onset   Heart attack Mother    Hypertension Mother    Diabetes Mother    Lung cancer Brother        d.57 from metastatic disease. History of smoking.   Healthy Brother    Hodgkin's lymphoma Maternal Uncle        d.62s   Ulcerative colitis Daughter    Breast cancer Cousin 30       d.30s    Social History Social History   Tobacco  Use   Smoking status: Never   Smokeless tobacco: Never  Vaping Use   Vaping status: Never Used  Substance Use Topics   Alcohol use: No  Drug use: No     Allergies   Asa [aspirin ], Compazine [prochlorperazine edisylate], Nsaids, Shellfish allergy, Sulfa antibiotics, Zantac [ranitidine hcl], and Lisinopril   Review of Systems Review of Systems  Constitutional:  Negative for chills and fever.  Eyes:  Negative for discharge and redness.  Respiratory:  Negative for shortness of breath.   Gastrointestinal:  Positive for abdominal pain. Negative for nausea and vomiting.  Genitourinary:  Positive for dysuria and frequency.  Musculoskeletal:  Positive for back pain.     Physical Exam Triage Vital Signs ED Triage Vitals  Encounter Vitals Group     BP      Girls Systolic BP Percentile      Girls Diastolic BP Percentile      Boys Systolic BP Percentile      Boys Diastolic BP Percentile      Pulse      Resp      Temp      Temp src      SpO2      Weight      Height      Head Circumference      Peak Flow      Pain Score      Pain Loc      Pain Education      Exclude from Growth Chart    No data found.  Updated Vital Signs BP (!) 156/77 (BP Location: Right Arm)   Pulse 85   Temp 98.3 F (36.8 C) (Oral)   Resp 18   SpO2 95%   Visual Acuity Right Eye Distance:   Left Eye Distance:   Bilateral Distance:    Right Eye Near:   Left Eye Near:    Bilateral Near:     Physical Exam Vitals and nursing note reviewed.  Constitutional:      General: She is not in acute distress.    Appearance: Normal appearance. She is not ill-appearing.  HENT:     Head: Normocephalic and atraumatic.   Eyes:     Conjunctiva/sclera: Conjunctivae normal.    Cardiovascular:     Rate and Rhythm: Normal rate.  Pulmonary:     Effort: Pulmonary effort is normal. No respiratory distress.   Neurological:     Mental Status: She is alert.   Psychiatric:        Mood and Affect: Mood  normal.        Behavior: Behavior normal.        Thought Content: Thought content normal.      UC Treatments / Results  Labs (all labs ordered are listed, but only abnormal results are displayed) Labs Reviewed  POCT URINALYSIS DIP (MANUAL ENTRY) - Abnormal; Notable for the following components:      Result Value   Clarity, UA cloudy (*)    Glucose, UA =500 (*)    Blood, UA large (*)    Leukocytes, UA Trace (*)    All other components within normal limits  URINE CULTURE    EKG   Radiology No results found.  Procedures Procedures (including critical care time)  Medications Ordered in UC Medications - No data to display  Initial Impression / Assessment and Plan / UC Course  I have reviewed the triage vital signs and the nursing notes.  Pertinent labs & imaging results that were available during my care of the patient were reviewed by me and considered in my medical decision making (see chart for details).     Cipro prescribed for  suspected UTI.  Urine culture ordered.  Recommended follow-up if no gradual improvement or with any worsening symptoms.  Patient expressed understanding.   Final Clinical Impressions(s) / UC Diagnoses   Final diagnoses:  Acute cystitis without hematuria   Discharge Instructions   None    ED Prescriptions     Medication Sig Dispense Auth. Provider   ciprofloxacin (CIPRO) 500 MG tablet Take 1 tablet (500 mg total) by mouth every 12 (twelve) hours. 10 tablet Vernestine Gondola, PA-C      PDMP not reviewed this encounter.   Vernestine Gondola, PA-C 01/30/24 1956

## 2024-02-01 LAB — URINE CULTURE: Culture: <10000 — AB

## 2024-02-03 ENCOUNTER — Ambulatory Visit (HOSPITAL_COMMUNITY): Payer: Self-pay

## 2024-02-20 DIAGNOSIS — Z853 Personal history of malignant neoplasm of breast: Secondary | ICD-10-CM | POA: Diagnosis not present

## 2024-02-20 DIAGNOSIS — D509 Iron deficiency anemia, unspecified: Secondary | ICD-10-CM | POA: Diagnosis not present

## 2024-02-20 DIAGNOSIS — I739 Peripheral vascular disease, unspecified: Secondary | ICD-10-CM | POA: Diagnosis not present

## 2024-02-20 DIAGNOSIS — E669 Obesity, unspecified: Secondary | ICD-10-CM | POA: Diagnosis not present

## 2024-02-20 DIAGNOSIS — I119 Hypertensive heart disease without heart failure: Secondary | ICD-10-CM | POA: Diagnosis not present

## 2024-02-20 DIAGNOSIS — G4733 Obstructive sleep apnea (adult) (pediatric): Secondary | ICD-10-CM | POA: Diagnosis not present

## 2024-02-20 DIAGNOSIS — I2510 Atherosclerotic heart disease of native coronary artery without angina pectoris: Secondary | ICD-10-CM | POA: Diagnosis not present

## 2024-02-20 DIAGNOSIS — E039 Hypothyroidism, unspecified: Secondary | ICD-10-CM | POA: Diagnosis not present

## 2024-02-20 DIAGNOSIS — M79674 Pain in right toe(s): Secondary | ICD-10-CM | POA: Diagnosis not present

## 2024-02-20 DIAGNOSIS — M8580 Other specified disorders of bone density and structure, unspecified site: Secondary | ICD-10-CM | POA: Diagnosis not present

## 2024-02-20 DIAGNOSIS — Z794 Long term (current) use of insulin: Secondary | ICD-10-CM | POA: Diagnosis not present

## 2024-02-20 DIAGNOSIS — E1142 Type 2 diabetes mellitus with diabetic polyneuropathy: Secondary | ICD-10-CM | POA: Diagnosis not present

## 2024-03-05 ENCOUNTER — Other Ambulatory Visit (HOSPITAL_COMMUNITY): Payer: Self-pay | Admitting: Family Medicine

## 2024-03-05 DIAGNOSIS — R1011 Right upper quadrant pain: Secondary | ICD-10-CM | POA: Diagnosis not present

## 2024-03-05 DIAGNOSIS — R109 Unspecified abdominal pain: Secondary | ICD-10-CM

## 2024-03-08 ENCOUNTER — Ambulatory Visit (HOSPITAL_BASED_OUTPATIENT_CLINIC_OR_DEPARTMENT_OTHER)
Admission: RE | Admit: 2024-03-08 | Discharge: 2024-03-08 | Disposition: A | Discharge status: Home / Self Care | Source: Ambulatory Visit | Attending: Family Medicine | Admitting: Family Medicine

## 2024-03-08 DIAGNOSIS — R1011 Right upper quadrant pain: Secondary | ICD-10-CM | POA: Insufficient documentation

## 2024-03-08 DIAGNOSIS — N281 Cyst of kidney, acquired: Secondary | ICD-10-CM | POA: Diagnosis not present

## 2024-03-09 ENCOUNTER — Encounter (HOSPITAL_BASED_OUTPATIENT_CLINIC_OR_DEPARTMENT_OTHER): Payer: Self-pay

## 2024-03-09 ENCOUNTER — Other Ambulatory Visit (HOSPITAL_BASED_OUTPATIENT_CLINIC_OR_DEPARTMENT_OTHER)

## 2024-03-09 ENCOUNTER — Ambulatory Visit (HOSPITAL_BASED_OUTPATIENT_CLINIC_OR_DEPARTMENT_OTHER)

## 2024-03-24 DIAGNOSIS — R399 Unspecified symptoms and signs involving the genitourinary system: Secondary | ICD-10-CM | POA: Diagnosis not present

## 2024-04-07 DIAGNOSIS — R600 Localized edema: Secondary | ICD-10-CM | POA: Diagnosis not present

## 2024-04-07 DIAGNOSIS — I10 Essential (primary) hypertension: Secondary | ICD-10-CM | POA: Diagnosis not present

## 2024-04-07 DIAGNOSIS — B3731 Acute candidiasis of vulva and vagina: Secondary | ICD-10-CM | POA: Diagnosis not present

## 2024-04-07 DIAGNOSIS — I25119 Atherosclerotic heart disease of native coronary artery with unspecified angina pectoris: Secondary | ICD-10-CM | POA: Diagnosis not present

## 2024-04-07 DIAGNOSIS — R053 Chronic cough: Secondary | ICD-10-CM | POA: Diagnosis not present

## 2024-04-07 DIAGNOSIS — E1142 Type 2 diabetes mellitus with diabetic polyneuropathy: Secondary | ICD-10-CM | POA: Diagnosis not present

## 2024-04-07 DIAGNOSIS — Z0000 Encounter for general adult medical examination without abnormal findings: Secondary | ICD-10-CM | POA: Diagnosis not present

## 2024-04-07 DIAGNOSIS — E7800 Pure hypercholesterolemia, unspecified: Secondary | ICD-10-CM | POA: Diagnosis not present

## 2024-04-07 DIAGNOSIS — E039 Hypothyroidism, unspecified: Secondary | ICD-10-CM | POA: Diagnosis not present

## 2024-04-11 ENCOUNTER — Other Ambulatory Visit: Payer: Self-pay | Admitting: Cardiology

## 2024-04-11 DIAGNOSIS — I25118 Atherosclerotic heart disease of native coronary artery with other forms of angina pectoris: Secondary | ICD-10-CM

## 2024-04-11 DIAGNOSIS — Z955 Presence of coronary angioplasty implant and graft: Secondary | ICD-10-CM

## 2024-04-11 DIAGNOSIS — Z951 Presence of aortocoronary bypass graft: Secondary | ICD-10-CM

## 2024-04-11 DIAGNOSIS — I10 Essential (primary) hypertension: Secondary | ICD-10-CM

## 2024-04-11 DIAGNOSIS — I214 Non-ST elevation (NSTEMI) myocardial infarction: Secondary | ICD-10-CM

## 2024-04-28 DIAGNOSIS — N952 Postmenopausal atrophic vaginitis: Secondary | ICD-10-CM | POA: Diagnosis not present

## 2024-04-28 DIAGNOSIS — R300 Dysuria: Secondary | ICD-10-CM | POA: Diagnosis not present

## 2024-04-28 DIAGNOSIS — E1165 Type 2 diabetes mellitus with hyperglycemia: Secondary | ICD-10-CM | POA: Diagnosis not present

## 2024-05-12 DIAGNOSIS — E1142 Type 2 diabetes mellitus with diabetic polyneuropathy: Secondary | ICD-10-CM | POA: Diagnosis not present

## 2024-05-12 DIAGNOSIS — I10 Essential (primary) hypertension: Secondary | ICD-10-CM | POA: Diagnosis not present

## 2024-05-12 DIAGNOSIS — Z794 Long term (current) use of insulin: Secondary | ICD-10-CM | POA: Diagnosis not present

## 2024-05-12 DIAGNOSIS — Z23 Encounter for immunization: Secondary | ICD-10-CM | POA: Diagnosis not present

## 2024-05-12 DIAGNOSIS — I2510 Atherosclerotic heart disease of native coronary artery without angina pectoris: Secondary | ICD-10-CM | POA: Diagnosis not present

## 2024-06-03 ENCOUNTER — Encounter: Payer: Self-pay | Admitting: Internal Medicine

## 2024-06-25 DIAGNOSIS — E1142 Type 2 diabetes mellitus with diabetic polyneuropathy: Secondary | ICD-10-CM | POA: Diagnosis not present

## 2024-06-25 DIAGNOSIS — K219 Gastro-esophageal reflux disease without esophagitis: Secondary | ICD-10-CM | POA: Diagnosis not present

## 2024-06-25 DIAGNOSIS — D126 Benign neoplasm of colon, unspecified: Secondary | ICD-10-CM | POA: Diagnosis not present

## 2024-06-25 DIAGNOSIS — E785 Hyperlipidemia, unspecified: Secondary | ICD-10-CM | POA: Diagnosis not present

## 2024-06-25 DIAGNOSIS — I5189 Other ill-defined heart diseases: Secondary | ICD-10-CM | POA: Diagnosis not present

## 2024-06-25 DIAGNOSIS — E039 Hypothyroidism, unspecified: Secondary | ICD-10-CM | POA: Diagnosis not present

## 2024-06-25 DIAGNOSIS — I2510 Atherosclerotic heart disease of native coronary artery without angina pectoris: Secondary | ICD-10-CM | POA: Diagnosis not present

## 2024-06-25 DIAGNOSIS — E049 Nontoxic goiter, unspecified: Secondary | ICD-10-CM | POA: Diagnosis not present

## 2024-06-25 DIAGNOSIS — I119 Hypertensive heart disease without heart failure: Secondary | ICD-10-CM | POA: Diagnosis not present

## 2024-06-25 DIAGNOSIS — I739 Peripheral vascular disease, unspecified: Secondary | ICD-10-CM | POA: Diagnosis not present

## 2024-06-25 DIAGNOSIS — G4733 Obstructive sleep apnea (adult) (pediatric): Secondary | ICD-10-CM | POA: Diagnosis not present

## 2024-06-25 DIAGNOSIS — Z794 Long term (current) use of insulin: Secondary | ICD-10-CM | POA: Diagnosis not present

## 2024-07-12 ENCOUNTER — Other Ambulatory Visit: Payer: Self-pay | Admitting: Cardiology

## 2024-07-12 DIAGNOSIS — Z951 Presence of aortocoronary bypass graft: Secondary | ICD-10-CM

## 2024-07-12 DIAGNOSIS — I214 Non-ST elevation (NSTEMI) myocardial infarction: Secondary | ICD-10-CM

## 2024-07-12 DIAGNOSIS — I25118 Atherosclerotic heart disease of native coronary artery with other forms of angina pectoris: Secondary | ICD-10-CM

## 2024-07-12 DIAGNOSIS — Z955 Presence of coronary angioplasty implant and graft: Secondary | ICD-10-CM

## 2024-08-07 ENCOUNTER — Encounter: Payer: Self-pay | Admitting: Internal Medicine

## 2024-08-07 ENCOUNTER — Ambulatory Visit: Admitting: Internal Medicine

## 2024-08-07 VITALS — BP 122/70 | HR 91 | Ht 61.0 in | Wt 159.0 lb

## 2024-08-07 DIAGNOSIS — Z860100 Personal history of colon polyps, unspecified: Secondary | ICD-10-CM

## 2024-08-07 DIAGNOSIS — R151 Fecal smearing: Secondary | ICD-10-CM | POA: Diagnosis not present

## 2024-08-07 DIAGNOSIS — K219 Gastro-esophageal reflux disease without esophagitis: Secondary | ICD-10-CM | POA: Diagnosis not present

## 2024-08-07 DIAGNOSIS — K222 Esophageal obstruction: Secondary | ICD-10-CM | POA: Diagnosis not present

## 2024-08-07 DIAGNOSIS — R195 Other fecal abnormalities: Secondary | ICD-10-CM

## 2024-08-07 DIAGNOSIS — Z860101 Personal history of adenomatous and serrated colon polyps: Secondary | ICD-10-CM

## 2024-08-07 NOTE — Patient Instructions (Signed)
 Take Citrucel as discussed with Dr. Abran  _______________________________________________________  If your blood pressure at your visit was 140/90 or greater, please contact your primary care physician to follow up on this.  _______________________________________________________  If you are age 79 or older, your body mass index should be between 23-30. Your Body mass index is 30.04 kg/m. If this is out of the aforementioned range listed, please consider follow up with your Primary Care Provider.  If you are age 65 or younger, your body mass index should be between 19-25. Your Body mass index is 30.04 kg/m. If this is out of the aformentioned range listed, please consider follow up with your Primary Care Provider.   ________________________________________________________  The [facility] providers would like to encourage you to use MYCHART to communicate with providers for non-urgent requests or questions.  Due to long hold times on the telephone, sending your provider a message by [facility] may be a faster and more efficient way to get a response.  Please allow 48 business hours for a response.  Please remember that this is for non-urgent requests.  _______________________________________________________  [facility] is using a team-based approach to care.  Your team is made up of your doctor and two to three APPS. Our APPS (Nurse Practitioners and Physician Assistants) work with your physician to ensure care continuity for you. They are fully qualified to address your health concerns and develop a treatment plan. They communicate directly with your gastroenterologist to care for you. Seeing the Advanced Practice Practitioners on your physician's team can help you by facilitating care more promptly, often allowing for earlier appointments, access to diagnostic testing, procedures, and other specialty referrals.

## 2024-08-07 NOTE — Progress Notes (Signed)
 HISTORY OF PRESENT ILLNESS:  Susan Barnett is a 78 y.o. female with multiple significant medical problems as listed below.  She presents today regarding intermittent issues with loose stools and fecal smearing.  Patient has a history of GERD complicated by peptic stricture for which she underwent upper endoscopy with esophageal dilation in October 2018.  She is maintained on pantoprazole .  No active issues with GERD or recurrent dysphagia.  She also has a history of diminutive adenomas in 2018.  Colonoscopy in 2008 was normal.  Patient tells me that she has had issues with loose stools for several years.  However, her problem has worsened in recent months.  She did notice worsening issues when she was placed on a higher dose of Janumet .  She has minor fecal leakage which she is not aware of on a regular basis.  She does wear protective pads.  No bleeding, abdominal pain, or other issues.  She has had significant cardiac issues over the past year or so.  She has had some weight gain.  She has tried Imodium sparingly.  This helps.  She wonders if this is okay to take.  No real issues with constipation  REVIEW OF SYSTEMS:  All non-GI ROS negative unless otherwise stated in the HPI except for excessive thirst  Past Medical History:  Diagnosis Date   Anemia    mild   Anginal pain    with exertion, last time prior to cardiac caht 06/02/14   Anxiety    situational   Arthritis    Breast cancer (HCC) 2018   Left Breast Cancer   CAD (coronary artery disease)    Dr. HILARIO Sharps follows, no problems now   Cancer [facility])    Breast cancer   Complication of anesthesia    Diabetes mellitus, type 2 (HCC)    TYPE 2   Dysrhythmia    occ skips a beat, rate was fast before beta blocker.   Gastric polyp    Genetic testing 04/12/2017   Ms. Lobosco underwent genetic counseling and testing for hereditary cancer syndromes on 02/19/2017. Her results were negative for mutations in all 46 genes analyzed by  [employer]'s 46-gene Common Hereditary Cancers Panel. Genes analyzed include: APC, ATM, AXIN2, BARD1, BMPR1A, BRCA1, BRCA2, BRIP1, CDH1, CDKN2A, CHEK2, CTNNA1, DICER1, EPCAM, GREM1, HOXB13, KIT, MEN1, MLH1, MSH2, MSH3, MSH6, MUTYH, NBN   GERD (gastroesophageal reflux disease)    Headache    hx of   Heartburn    Hiatal hernia    mild head tremors   History of radiation therapy 01/30/17- 02/21/17   Left Breast 42.56 Gy in 16 fractions   Hypertension    Hypothyroidism    Occasional tremors    of head, slight hands   Osteopenia    Personal history of radiation therapy 2018   Left Breast Cancer   Pneumonia    PONV (postoperative nausea and vomiting) 02/24/14   Shortness of breath    with exertion   Sleep apnea    cpap used nightly x 2 yrs now.    Past Surgical History:  Procedure Laterality Date   BREAST BIOPSY Left 1987   BREAST BIOPSY Left 2019   BREAST LUMPECTOMY Left 01/25/2017   BREAST LUMPECTOMY WITH RADIOACTIVE SEED AND SENTINEL LYMPH NODE BIOPSY Left 12/25/2016   Procedure: BREAST LUMPECTOMY WITH RADIOACTIVE SEED AND SENTINEL LYMPH NODE BIOPSY;  Surgeon: Mikell Katz, MD;  Location: [facility] OR;  Service: General;  Laterality: Left;   CARDIAC CATHETERIZATION  06/02/2014  LAD 30%, D1 80%, CFX 755, RCA > 95% ISR, rx w/ PTCA, heavy calcification, EF 65%   CORONARY ARTERY BYPASS GRAFT N/A 06/14/2014   Procedure: CORONARY ARTERY BYPASS GRAFTING (CABG);  Surgeon: Dorise MARLA Fellers, MD;  Location: [facility] OR;  Service: Open Heart Surgery;  Laterality: N/A;  Times 2 using endoscopically harvested right saphenous vein.   CORONARY STENT INTERVENTION N/A 12/10/2022   Procedure: CORONARY STENT INTERVENTION;  Surgeon: Elmira Newman PARAS, MD;  [facility] INVAS[facility] CV LAB;  Service: Cardiovascular;  Laterality: N/A;   coronary stents     x3 stents placed-prior ablation   CORONARY ULTRASOUND/IVUS N/A 12/10/2022   Procedure: Coronary Ultrasound/IVUS;  Surgeon: Elmira Newman PARAS, MD;  [facility]: [facility]  INVASIVE CV LAB;  Service: Cardiovascular;  Laterality: N/A;   DILATION AND CURETTAGE OF UTERUS     ELBOW FRACTURE SURGERY Left    INCONTINENCE SURGERY     sling   KNEE ARTHROSCOPY Right 02/24/2014   Procedure: RIGHT KNEE ARTHROSCOPY WITH DEBRIDEMENT ;  Surgeon: Dempsey Melodi GAILS, MD;  Location: [facility] ORS;  Service: Orthopedics;  Laterality: Right;   LEFT HEART CATH AND CORS/GRAFTS ANGIOGRAPHY N/A 12/03/2022   Procedure: LEFT HEART CATH AND CORS/GRAFTS ANGIOGRAPHY;  Surgeon: Elmira Newman PARAS, MD;  [facility];  Service: Cardiovascular;  Laterality: N/A;   LEFT HEART CATH AND CORS/GRAFTS ANGIOGRAPHY N/A 12/10/2022   Procedure: LEFT HEART CATH AND CORS/GRAFTS ANGIOGRAPHY;  Surgeon: Elmira Newman PARAS, MD;  [facility];  Service: Cardiovascular;  Laterality: N/A;   LEFT HEART CATH AND CORS/GRAFTS ANGIOGRAPHY N/A 12/24/2022   Procedure: LEFT HEART CATH AND CORS/GRAFTS ANGIOGRAPHY;  Surgeon: Elmira Newman PARAS, MD;  [facility];  Service: Cardiovascular;  Laterality: N/A;   LEFT HEART CATHETERIZATION WITH CORONARY ANGIOGRAM N/A 06/02/2014   Procedure: LEFT HEART CATHETERIZATION WITH CORONARY ANGIOGRAM;  Surgeon: Victory LELON Claudene DOUGLAS, MD;  Location: [facility] CATH LAB;  Service: Cardiovascular;  Laterality: N/A;   TEE WITHOUT CARDIOVERSION N/A 06/14/2014   Procedure: TRANSESOPHAGEAL ECHOCARDIOGRAM (TEE);  Surgeon: Dorise MARLA Fellers, MD;  Location: [facility] OR;  Service: Open Heart Surgery;  Laterality: N/A;   TEMPORARY PACEMAKER N/A 12/03/2022   Procedure: TEMPORARY PACEMAKER;  Surgeon: Elmira Newman PARAS, MD;  [facility] INV[facility] LAB;  Service: Cardiovascular;  Laterality: N/A;    Social History KAILEA DANNEMILLER  reports that she has never smoked. She has never used smokeless tobacco. She reports that she does not drink alcohol and does not use drugs.  family history includes Breast cancer (age of onset: 56) in her cousin; Diabetes in her mother; Healthy in her  brother; Heart attack in her mother; Hodgkin's lymphoma in her maternal uncle; Hypertension in her mother; Lung cancer in her brother; Ulcerative colitis in her daughter.  Allergies[1]     PHYSICAL EXAMINATION: Vital signs: BP 122/70   Pulse 91   Ht 5' 1 (1.549 m)   Wt 159 lb (72.1 kg)   BMI 30.04 kg/m   Constitutional: Pleasant elderly female, no acute distress Psychiatric: alert and oriented x3, cooperative Eyes: extraocular movements intact, anicteric, conjunctiva pink Mouth: oral pharynx moist, no lesions Neck: supple no lymphadenopathy Cardiovascular: heart regular rate and rhythm, no murmur Lungs: clear to auscultation bilaterally Abdomen: soft, nontender, nondistended, no obvious ascites, no peritoneal signs, normal bowel sounds, no organomegaly Rectal: Omitted Extremities: no clubbing or cyanosis.  Trace lower extremity edema bilaterally Skin: no lesions on visible extremities Neuro: No focal deficits.  Ambulates with walker  ASSESSMENT:  1.  Loose stools.  No alarm features.  Could be a side effect of the metformin  component of her Janumet  2.  Minor incontinence.  Wears protective pads 3.  History of diminutive adenomas.  Aged out of surveillance 4.  GERD complicated by peptic stricture.  Asymptomatic post dilation on PPI    PLAN:  1.  Recommend Citrucel 2 tablespoons daily 2.  Imodium as needed is okay 3.  Continue to wear protective undergarments 4.  Consider trial of holding Janumet  for short period of time to see if this helps with her bowel issues.  She will discuss this with her prescribing physician. 5.  Reflux precautions 6.  Continue pantoprazole  7.  Return to care PCP 8.  GI follow-up as needed A total time of 45 minutes was spent preparing to see the patient, obtaining interval history, performing medically appropriate physical exam, counseling and educating the patient regarding the above listed issues, directing medical therapies, and documenting  clinical information in the health record         [1]  Allergies Allergen Reactions   Asa [Aspirin ] Anaphylaxis   Compazine [Prochlorperazine Edisylate] Swelling and Other (See Comments)    Tongue swelling    Nsaids Anaphylaxis   Shellfish Allergy Hives and Swelling   Sulfa Antibiotics Hives and Swelling   Zantac [Ranitidine Hcl] Hives and Swelling   Lisinopril Cough
